# Patient Record
Sex: Female | Born: 1960 | Race: White | Hispanic: No | Marital: Married | Smoking: Former smoker
Health system: Southern US, Community
[De-identification: ages and names within clinical notes are randomized; demographics above are authoritative.]

## PROBLEM LIST (undated history)

## (undated) DIAGNOSIS — T8859XA Other complications of anesthesia, initial encounter: Secondary | ICD-10-CM

## (undated) DIAGNOSIS — R0600 Dyspnea, unspecified: Secondary | ICD-10-CM

## (undated) DIAGNOSIS — K219 Gastro-esophageal reflux disease without esophagitis: Secondary | ICD-10-CM

## (undated) DIAGNOSIS — Z8679 Personal history of other diseases of the circulatory system: Secondary | ICD-10-CM

## (undated) DIAGNOSIS — J449 Chronic obstructive pulmonary disease, unspecified: Secondary | ICD-10-CM

## (undated) DIAGNOSIS — E781 Pure hyperglyceridemia: Secondary | ICD-10-CM

## (undated) DIAGNOSIS — A048 Other specified bacterial intestinal infections: Secondary | ICD-10-CM

## (undated) DIAGNOSIS — I739 Peripheral vascular disease, unspecified: Secondary | ICD-10-CM

## (undated) DIAGNOSIS — D1803 Hemangioma of intra-abdominal structures: Secondary | ICD-10-CM

## (undated) DIAGNOSIS — J189 Pneumonia, unspecified organism: Secondary | ICD-10-CM

## (undated) DIAGNOSIS — K439 Ventral hernia without obstruction or gangrene: Secondary | ICD-10-CM

## (undated) DIAGNOSIS — Z5189 Encounter for other specified aftercare: Secondary | ICD-10-CM

## (undated) DIAGNOSIS — F410 Panic disorder [episodic paroxysmal anxiety] without agoraphobia: Secondary | ICD-10-CM

## (undated) DIAGNOSIS — E785 Hyperlipidemia, unspecified: Secondary | ICD-10-CM

## (undated) DIAGNOSIS — F32A Depression, unspecified: Secondary | ICD-10-CM

## (undated) DIAGNOSIS — Z87442 Personal history of urinary calculi: Secondary | ICD-10-CM

## (undated) DIAGNOSIS — I10 Essential (primary) hypertension: Secondary | ICD-10-CM

## (undated) DIAGNOSIS — F329 Major depressive disorder, single episode, unspecified: Secondary | ICD-10-CM

## (undated) HISTORY — DX: Peripheral vascular disease, unspecified: I73.9

## (undated) HISTORY — DX: Ventral hernia without obstruction or gangrene: K43.9

## (undated) HISTORY — DX: Pure hyperglyceridemia: E78.1

## (undated) HISTORY — DX: Depression, unspecified: F32.A

## (undated) HISTORY — DX: Gastro-esophageal reflux disease without esophagitis: K21.9

## (undated) HISTORY — DX: Panic disorder (episodic paroxysmal anxiety): F41.0

## (undated) HISTORY — DX: Other specified bacterial intestinal infections: A04.8

## (undated) HISTORY — DX: Hemangioma of intra-abdominal structures: D18.03

## (undated) HISTORY — DX: Hyperlipidemia, unspecified: E78.5

## (undated) HISTORY — DX: Chronic obstructive pulmonary disease, unspecified: J44.9

## (undated) HISTORY — DX: Major depressive disorder, single episode, unspecified: F32.9

## (undated) HISTORY — DX: Encounter for other specified aftercare: Z51.89

## (undated) HISTORY — PX: OVARIAN CYST REMOVAL: SHX89

## (undated) HISTORY — PX: TUBAL LIGATION: SHX77

---

## 1997-08-17 ENCOUNTER — Ambulatory Visit (HOSPITAL_COMMUNITY): Admission: RE | Admit: 1997-08-17 | Discharge: 1997-08-17 | Payer: Self-pay | Admitting: *Deleted

## 1997-08-19 ENCOUNTER — Ambulatory Visit (HOSPITAL_COMMUNITY): Admission: RE | Admit: 1997-08-19 | Discharge: 1997-08-19 | Payer: Self-pay | Admitting: *Deleted

## 1997-09-02 ENCOUNTER — Ambulatory Visit (HOSPITAL_COMMUNITY): Admission: RE | Admit: 1997-09-02 | Discharge: 1997-09-02 | Payer: Self-pay

## 1998-01-09 ENCOUNTER — Emergency Department (HOSPITAL_COMMUNITY): Admission: EM | Admit: 1998-01-09 | Discharge: 1998-01-09 | Payer: Self-pay | Admitting: Emergency Medicine

## 1998-03-04 ENCOUNTER — Ambulatory Visit (HOSPITAL_COMMUNITY): Admission: RE | Admit: 1998-03-04 | Discharge: 1998-03-04 | Payer: Self-pay

## 1998-10-14 ENCOUNTER — Encounter: Payer: Self-pay | Admitting: *Deleted

## 1998-10-14 ENCOUNTER — Ambulatory Visit (HOSPITAL_COMMUNITY): Admission: RE | Admit: 1998-10-14 | Discharge: 1998-10-14 | Payer: Self-pay | Admitting: *Deleted

## 2000-05-09 ENCOUNTER — Encounter: Payer: Self-pay | Admitting: *Deleted

## 2000-05-09 ENCOUNTER — Ambulatory Visit (HOSPITAL_COMMUNITY): Admission: RE | Admit: 2000-05-09 | Discharge: 2000-05-09 | Payer: Self-pay | Admitting: *Deleted

## 2001-01-04 ENCOUNTER — Emergency Department (HOSPITAL_COMMUNITY): Admission: EM | Admit: 2001-01-04 | Discharge: 2001-01-04 | Payer: Self-pay | Admitting: Unknown Physician Specialty

## 2001-01-11 ENCOUNTER — Emergency Department (HOSPITAL_COMMUNITY): Admission: EM | Admit: 2001-01-11 | Discharge: 2001-01-11 | Payer: Self-pay | Admitting: Emergency Medicine

## 2001-01-11 ENCOUNTER — Encounter: Payer: Self-pay | Admitting: Emergency Medicine

## 2002-04-21 ENCOUNTER — Inpatient Hospital Stay (HOSPITAL_COMMUNITY): Admission: EM | Admit: 2002-04-21 | Discharge: 2002-04-23 | Payer: Self-pay | Admitting: Emergency Medicine

## 2002-06-26 DIAGNOSIS — Z794 Long term (current) use of insulin: Secondary | ICD-10-CM

## 2002-06-26 DIAGNOSIS — E119 Type 2 diabetes mellitus without complications: Secondary | ICD-10-CM | POA: Insufficient documentation

## 2003-09-01 ENCOUNTER — Emergency Department (HOSPITAL_COMMUNITY): Admission: EM | Admit: 2003-09-01 | Discharge: 2003-09-01 | Payer: Self-pay | Admitting: Emergency Medicine

## 2003-09-09 ENCOUNTER — Encounter: Admission: RE | Admit: 2003-09-09 | Discharge: 2003-09-09 | Payer: Self-pay | Admitting: Internal Medicine

## 2003-09-21 ENCOUNTER — Encounter: Admission: RE | Admit: 2003-09-21 | Discharge: 2003-09-21 | Payer: Self-pay | Admitting: Internal Medicine

## 2003-10-28 ENCOUNTER — Encounter: Admission: RE | Admit: 2003-10-28 | Discharge: 2003-10-28 | Payer: Self-pay | Admitting: Internal Medicine

## 2003-11-04 ENCOUNTER — Encounter: Admission: RE | Admit: 2003-11-04 | Discharge: 2003-11-04 | Payer: Self-pay | Admitting: Internal Medicine

## 2003-11-04 ENCOUNTER — Ambulatory Visit (HOSPITAL_COMMUNITY): Admission: RE | Admit: 2003-11-04 | Discharge: 2003-11-04 | Payer: Self-pay | Admitting: Internal Medicine

## 2003-12-29 ENCOUNTER — Encounter: Admission: RE | Admit: 2003-12-29 | Discharge: 2003-12-29 | Payer: Self-pay | Admitting: Internal Medicine

## 2004-01-29 ENCOUNTER — Encounter: Admission: RE | Admit: 2004-01-29 | Discharge: 2004-01-29 | Payer: Self-pay | Admitting: Internal Medicine

## 2004-03-07 ENCOUNTER — Ambulatory Visit: Payer: Self-pay | Admitting: Internal Medicine

## 2004-03-10 ENCOUNTER — Ambulatory Visit (HOSPITAL_COMMUNITY): Admission: RE | Admit: 2004-03-10 | Discharge: 2004-03-10 | Payer: Self-pay | Admitting: Internal Medicine

## 2004-04-06 ENCOUNTER — Ambulatory Visit: Payer: Self-pay | Admitting: Internal Medicine

## 2004-04-11 ENCOUNTER — Ambulatory Visit (HOSPITAL_COMMUNITY): Admission: RE | Admit: 2004-04-11 | Discharge: 2004-04-11 | Payer: Self-pay | Admitting: Internal Medicine

## 2004-04-11 ENCOUNTER — Ambulatory Visit: Payer: Self-pay | Admitting: Internal Medicine

## 2004-04-11 ENCOUNTER — Encounter (INDEPENDENT_AMBULATORY_CARE_PROVIDER_SITE_OTHER): Payer: Self-pay | Admitting: *Deleted

## 2004-05-31 ENCOUNTER — Ambulatory Visit: Payer: Self-pay | Admitting: Internal Medicine

## 2004-10-04 ENCOUNTER — Ambulatory Visit: Payer: Self-pay | Admitting: Internal Medicine

## 2005-06-29 ENCOUNTER — Emergency Department (HOSPITAL_COMMUNITY): Admission: EM | Admit: 2005-06-29 | Discharge: 2005-06-29 | Payer: Self-pay | Admitting: Emergency Medicine

## 2005-12-13 ENCOUNTER — Ambulatory Visit: Payer: Self-pay | Admitting: Internal Medicine

## 2006-01-02 ENCOUNTER — Encounter (INDEPENDENT_AMBULATORY_CARE_PROVIDER_SITE_OTHER): Payer: Self-pay | Admitting: Specialist

## 2006-01-02 ENCOUNTER — Encounter (INDEPENDENT_AMBULATORY_CARE_PROVIDER_SITE_OTHER): Payer: Self-pay | Admitting: *Deleted

## 2006-01-02 ENCOUNTER — Ambulatory Visit: Payer: Self-pay | Admitting: Hospitalist

## 2006-01-05 ENCOUNTER — Encounter: Admission: RE | Admit: 2006-01-05 | Discharge: 2006-01-05 | Payer: Self-pay | Admitting: Internal Medicine

## 2006-06-12 ENCOUNTER — Ambulatory Visit: Payer: Self-pay | Admitting: *Deleted

## 2006-06-12 LAB — CONVERTED CEMR LAB
ALT: 23 units/L (ref 0–35)
Alkaline Phosphatase: 91 units/L (ref 39–117)
BUN: 13 mg/dL (ref 6–23)
Calcium: 9.3 mg/dL (ref 8.4–10.5)
Cholesterol: 202 mg/dL — ABNORMAL HIGH (ref 0–200)
Creatinine, Ser: 0.58 mg/dL (ref 0.40–1.20)
Glucose, Bld: 157 mg/dL — ABNORMAL HIGH (ref 70–99)
HCT: 45 % — ABNORMAL HIGH (ref 34.4–43.3)
HDL: 23 mg/dL — ABNORMAL LOW (ref >39)
MCHC: 34 g/dL (ref 33.1–35.4)
MCV: 89.6 fL (ref 78.8–100.0)
Microalb Creat Ratio: 32.8 mg/g — ABNORMAL HIGH (ref 0.0–30.0)
RBC: 5.02 M/uL — ABNORMAL HIGH (ref 3.79–4.96)
Total CHOL/HDL Ratio: 8.8
Total Protein: 7 g/dL (ref 6.0–8.3)
Triglycerides: 534 mg/dL — ABNORMAL HIGH (ref <150)
WBC: 10.9 10*3/uL — ABNORMAL HIGH (ref 3.7–10.0)

## 2006-06-14 ENCOUNTER — Encounter (INDEPENDENT_AMBULATORY_CARE_PROVIDER_SITE_OTHER): Payer: Self-pay | Admitting: *Deleted

## 2006-06-14 DIAGNOSIS — K219 Gastro-esophageal reflux disease without esophagitis: Secondary | ICD-10-CM | POA: Insufficient documentation

## 2006-06-14 DIAGNOSIS — E785 Hyperlipidemia, unspecified: Secondary | ICD-10-CM

## 2006-06-14 DIAGNOSIS — J45909 Unspecified asthma, uncomplicated: Secondary | ICD-10-CM | POA: Insufficient documentation

## 2006-11-21 ENCOUNTER — Ambulatory Visit: Payer: Self-pay | Admitting: Internal Medicine

## 2006-11-21 ENCOUNTER — Encounter (INDEPENDENT_AMBULATORY_CARE_PROVIDER_SITE_OTHER): Payer: Self-pay | Admitting: *Deleted

## 2006-12-11 LAB — CONVERTED CEMR LAB
ALT: 18 units/L (ref 0–35)
AST: 14 units/L (ref 0–37)
Albumin: 4.6 g/dL (ref 3.5–5.2)
Calcium: 9.4 mg/dL (ref 8.4–10.5)
Chloride: 104 meq/L (ref 96–112)
Creatinine, Urine: 59.2 mg/dL
Glucose, Bld: 99 mg/dL (ref 70–99)
Helicobacter Pylori Antibody-IgG: 1.2 — ABNORMAL HIGH
Microalb Creat Ratio: 32.6 mg/g — ABNORMAL HIGH (ref 0.0–30.0)
Potassium: 4 meq/L (ref 3.5–5.3)
TSH: 2.148 microintl units/mL (ref 0.350–5.50)

## 2007-04-15 ENCOUNTER — Telehealth (INDEPENDENT_AMBULATORY_CARE_PROVIDER_SITE_OTHER): Payer: Self-pay | Admitting: *Deleted

## 2007-05-07 ENCOUNTER — Ambulatory Visit: Payer: Self-pay | Admitting: *Deleted

## 2007-05-07 LAB — CONVERTED CEMR LAB: Blood Glucose, Fingerstick: 178

## 2007-05-13 LAB — CONVERTED CEMR LAB
AST: 14 units/L (ref 0–37)
Albumin: 4.7 g/dL (ref 3.5–5.2)
Alkaline Phosphatase: 93 units/L (ref 39–117)
HCT: 48.4 % — ABNORMAL HIGH (ref 36.0–46.0)
MCHC: 32.9 g/dL (ref 30.0–36.0)
MCV: 91.8 fL (ref 78.0–100.0)
Platelets: 375 10*3/uL (ref 150–400)
RBC: 5.27 M/uL — ABNORMAL HIGH (ref 3.87–5.11)
RDW: 13 % (ref 11.5–15.5)
Total Bilirubin: 0.4 mg/dL (ref 0.3–1.2)
Total Protein: 7.3 g/dL (ref 6.0–8.3)
WBC: 14.6 10*3/uL — ABNORMAL HIGH (ref 4.0–10.5)

## 2007-07-22 ENCOUNTER — Telehealth (INDEPENDENT_AMBULATORY_CARE_PROVIDER_SITE_OTHER): Payer: Self-pay | Admitting: *Deleted

## 2007-10-23 ENCOUNTER — Ambulatory Visit: Payer: Self-pay | Admitting: *Deleted

## 2007-10-23 ENCOUNTER — Encounter (INDEPENDENT_AMBULATORY_CARE_PROVIDER_SITE_OTHER): Payer: Self-pay | Admitting: *Deleted

## 2007-10-23 DIAGNOSIS — F17200 Nicotine dependence, unspecified, uncomplicated: Secondary | ICD-10-CM | POA: Insufficient documentation

## 2007-10-23 LAB — CONVERTED CEMR LAB
Alkaline Phosphatase: 100 units/L (ref 39–117)
BUN: 11 mg/dL (ref 6–23)
CO2: 25 meq/L (ref 19–32)
Chloride: 101 meq/L (ref 96–112)
Cholesterol: 221 mg/dL — ABNORMAL HIGH (ref 0–200)
Creatinine, Ser: 0.62 mg/dL (ref 0.40–1.20)
Glucose, Bld: 278 mg/dL — ABNORMAL HIGH (ref 70–99)
HDL: 26 mg/dL — ABNORMAL LOW (ref >39)
Potassium: 3.9 meq/L (ref 3.5–5.3)
Total Bilirubin: 0.4 mg/dL (ref 0.3–1.2)
Total CHOL/HDL Ratio: 8.5

## 2007-11-04 ENCOUNTER — Telehealth (INDEPENDENT_AMBULATORY_CARE_PROVIDER_SITE_OTHER): Payer: Self-pay | Admitting: *Deleted

## 2007-11-26 ENCOUNTER — Ambulatory Visit: Payer: Self-pay | Admitting: *Deleted

## 2007-11-27 LAB — CONVERTED CEMR LAB
CO2: 22 meq/L (ref 19–32)
Chloride: 105 meq/L (ref 96–112)
Cholesterol: 218 mg/dL — ABNORMAL HIGH (ref 0–200)
Glucose, Bld: 148 mg/dL — ABNORMAL HIGH (ref 70–99)
Sodium: 139 meq/L (ref 135–145)
VLDL: 70 mg/dL — ABNORMAL HIGH (ref 0–40)

## 2008-02-03 ENCOUNTER — Ambulatory Visit: Payer: Self-pay | Admitting: *Deleted

## 2008-02-03 LAB — CONVERTED CEMR LAB: Hgb A1c MFr Bld: 7.4 %

## 2008-02-04 LAB — CONVERTED CEMR LAB
ALT: 11 units/L (ref 0–35)
AST: 12 units/L (ref 0–37)
CO2: 21 meq/L (ref 19–32)
Calcium: 9.4 mg/dL (ref 8.4–10.5)
Chloride: 106 meq/L (ref 96–112)
Glucose, Bld: 156 mg/dL — ABNORMAL HIGH (ref 70–99)
Potassium: 4.3 meq/L (ref 3.5–5.3)
Sodium: 139 meq/L (ref 135–145)
Total Bilirubin: 0.4 mg/dL (ref 0.3–1.2)

## 2008-03-13 ENCOUNTER — Telehealth: Payer: Self-pay | Admitting: *Deleted

## 2008-04-06 ENCOUNTER — Encounter (INDEPENDENT_AMBULATORY_CARE_PROVIDER_SITE_OTHER): Payer: Self-pay | Admitting: *Deleted

## 2008-04-15 ENCOUNTER — Ambulatory Visit: Payer: Self-pay | Admitting: *Deleted

## 2008-04-15 DIAGNOSIS — F419 Anxiety disorder, unspecified: Secondary | ICD-10-CM | POA: Insufficient documentation

## 2008-05-25 ENCOUNTER — Telehealth: Payer: Self-pay | Admitting: Internal Medicine

## 2008-06-17 ENCOUNTER — Telehealth: Payer: Self-pay | Admitting: Infectious Diseases

## 2008-07-16 ENCOUNTER — Telehealth: Payer: Self-pay | Admitting: *Deleted

## 2008-07-23 ENCOUNTER — Encounter (INDEPENDENT_AMBULATORY_CARE_PROVIDER_SITE_OTHER): Payer: Self-pay | Admitting: Internal Medicine

## 2008-07-23 ENCOUNTER — Ambulatory Visit: Payer: Self-pay | Admitting: Infectious Disease

## 2008-07-23 DIAGNOSIS — R1012 Left upper quadrant pain: Secondary | ICD-10-CM | POA: Insufficient documentation

## 2008-07-23 DIAGNOSIS — F329 Major depressive disorder, single episode, unspecified: Secondary | ICD-10-CM

## 2008-07-23 DIAGNOSIS — F419 Anxiety disorder, unspecified: Secondary | ICD-10-CM

## 2008-07-23 LAB — CONVERTED CEMR LAB: Hgb A1c MFr Bld: 9 %

## 2008-07-26 LAB — CONVERTED CEMR LAB
Albumin: 4.5 g/dL (ref 3.5–5.2)
Alkaline Phosphatase: 93 units/L (ref 39–117)
Chloride: 104 meq/L (ref 96–112)
Eosinophils Relative: 3 % (ref 0–5)
Glucose, Bld: 115 mg/dL — ABNORMAL HIGH (ref 70–99)
Hemoglobin: 15.2 g/dL — ABNORMAL HIGH (ref 12.0–15.0)
Lymphocytes Relative: 28 % (ref 12–46)
Monocytes Absolute: 0.7 10*3/uL (ref 0.1–1.0)
Neutro Abs: 9.3 10*3/uL — ABNORMAL HIGH (ref 1.7–7.7)
RDW: 13 % (ref 11.5–15.5)
Total Bilirubin: 0.4 mg/dL (ref 0.3–1.2)
WBC: 14.4 10*3/uL — ABNORMAL HIGH (ref 4.0–10.5)

## 2008-07-29 ENCOUNTER — Telehealth: Payer: Self-pay | Admitting: *Deleted

## 2008-08-21 ENCOUNTER — Telehealth (INDEPENDENT_AMBULATORY_CARE_PROVIDER_SITE_OTHER): Payer: Self-pay | Admitting: *Deleted

## 2008-09-02 ENCOUNTER — Ambulatory Visit: Payer: Self-pay | Admitting: Internal Medicine

## 2008-09-02 DIAGNOSIS — R1013 Epigastric pain: Secondary | ICD-10-CM

## 2008-09-02 DIAGNOSIS — K5900 Constipation, unspecified: Secondary | ICD-10-CM

## 2008-09-08 ENCOUNTER — Telehealth (INDEPENDENT_AMBULATORY_CARE_PROVIDER_SITE_OTHER): Payer: Self-pay | Admitting: *Deleted

## 2008-09-29 ENCOUNTER — Ambulatory Visit: Payer: Self-pay | Admitting: *Deleted

## 2008-09-29 LAB — CONVERTED CEMR LAB
BUN: 10 mg/dL (ref 6–23)
CO2: 21 meq/L (ref 19–32)
Calcium: 9.3 mg/dL (ref 8.4–10.5)
Chloride: 102 meq/L (ref 96–112)
Creatinine, Ser: 0.97 mg/dL (ref 0.40–1.20)
GFR calc Af Amer: >60 mL/min (ref >60)
Glucose, Bld: 223 mg/dL — ABNORMAL HIGH (ref 70–99)
Microalb Creat Ratio: 24.3 mg/g (ref 0.0–30.0)
Total CHOL/HDL Ratio: 6.9

## 2008-09-30 ENCOUNTER — Encounter (INDEPENDENT_AMBULATORY_CARE_PROVIDER_SITE_OTHER): Payer: Self-pay | Admitting: *Deleted

## 2008-11-18 ENCOUNTER — Telehealth (INDEPENDENT_AMBULATORY_CARE_PROVIDER_SITE_OTHER): Payer: Self-pay | Admitting: *Deleted

## 2008-12-01 ENCOUNTER — Encounter (INDEPENDENT_AMBULATORY_CARE_PROVIDER_SITE_OTHER): Payer: Self-pay | Admitting: *Deleted

## 2008-12-01 ENCOUNTER — Ambulatory Visit: Payer: Self-pay | Admitting: *Deleted

## 2008-12-03 ENCOUNTER — Encounter (INDEPENDENT_AMBULATORY_CARE_PROVIDER_SITE_OTHER): Payer: Self-pay | Admitting: *Deleted

## 2009-01-15 ENCOUNTER — Telehealth (INDEPENDENT_AMBULATORY_CARE_PROVIDER_SITE_OTHER): Payer: Self-pay | Admitting: *Deleted

## 2009-02-11 ENCOUNTER — Telehealth (INDEPENDENT_AMBULATORY_CARE_PROVIDER_SITE_OTHER): Payer: Self-pay | Admitting: Internal Medicine

## 2009-03-09 ENCOUNTER — Ambulatory Visit: Payer: Self-pay | Admitting: Infectious Disease

## 2009-03-09 DIAGNOSIS — R6882 Decreased libido: Secondary | ICD-10-CM | POA: Insufficient documentation

## 2009-03-09 LAB — CONVERTED CEMR LAB: Blood Glucose, AC Bkfst: 314 mg/dL

## 2009-04-12 ENCOUNTER — Ambulatory Visit: Payer: Self-pay | Admitting: Internal Medicine

## 2009-04-12 LAB — CONVERTED CEMR LAB: Blood Glucose, Fingerstick: 361

## 2009-04-19 ENCOUNTER — Encounter (INDEPENDENT_AMBULATORY_CARE_PROVIDER_SITE_OTHER): Payer: Self-pay | Admitting: Internal Medicine

## 2009-04-19 ENCOUNTER — Ambulatory Visit: Payer: Self-pay | Admitting: Internal Medicine

## 2009-05-17 ENCOUNTER — Telehealth: Payer: Self-pay | Admitting: Internal Medicine

## 2009-07-16 ENCOUNTER — Telehealth: Payer: Self-pay | Admitting: *Deleted

## 2009-07-28 ENCOUNTER — Telehealth: Payer: Self-pay | Admitting: *Deleted

## 2009-08-24 ENCOUNTER — Telehealth (INDEPENDENT_AMBULATORY_CARE_PROVIDER_SITE_OTHER): Payer: Self-pay | Admitting: *Deleted

## 2009-09-01 ENCOUNTER — Ambulatory Visit: Payer: Self-pay | Admitting: Internal Medicine

## 2009-09-01 LAB — CONVERTED CEMR LAB: Blood Glucose, Fingerstick: 130

## 2009-09-16 ENCOUNTER — Telehealth (INDEPENDENT_AMBULATORY_CARE_PROVIDER_SITE_OTHER): Payer: Self-pay | Admitting: Internal Medicine

## 2009-10-06 ENCOUNTER — Telehealth (INDEPENDENT_AMBULATORY_CARE_PROVIDER_SITE_OTHER): Payer: Self-pay | Admitting: Internal Medicine

## 2009-11-25 ENCOUNTER — Ambulatory Visit: Payer: Self-pay | Admitting: Internal Medicine

## 2009-11-26 DIAGNOSIS — E781 Pure hyperglyceridemia: Secondary | ICD-10-CM | POA: Insufficient documentation

## 2009-11-26 LAB — CONVERTED CEMR LAB
ALT: 16 units/L (ref 0–35)
Albumin: 4.3 g/dL (ref 3.5–5.2)
Calcium: 9.4 mg/dL (ref 8.4–10.5)
Chloride: 97 meq/L (ref 96–112)
Creatinine, Urine: 57.9 mg/dL
HDL: 26 mg/dL — ABNORMAL LOW (ref >39)
Microalb, Ur: 54.57 mg/dL — ABNORMAL HIGH (ref 0.00–1.89)
Potassium: 4.3 meq/L (ref 3.5–5.3)
Total Bilirubin: 0.3 mg/dL (ref 0.3–1.2)
Total CHOL/HDL Ratio: 12

## 2010-01-07 ENCOUNTER — Telehealth: Payer: Self-pay | Admitting: Internal Medicine

## 2010-02-21 ENCOUNTER — Telehealth: Payer: Self-pay | Admitting: Internal Medicine

## 2010-03-01 ENCOUNTER — Ambulatory Visit: Payer: Self-pay | Admitting: Internal Medicine

## 2010-03-01 LAB — CONVERTED CEMR LAB: Hgb A1c MFr Bld: 9.6 %

## 2010-03-02 ENCOUNTER — Telehealth: Payer: Self-pay | Admitting: *Deleted

## 2010-03-11 ENCOUNTER — Ambulatory Visit: Payer: Self-pay | Admitting: Internal Medicine

## 2010-03-11 ENCOUNTER — Telehealth (INDEPENDENT_AMBULATORY_CARE_PROVIDER_SITE_OTHER): Payer: Self-pay | Admitting: *Deleted

## 2010-03-15 LAB — CONVERTED CEMR LAB: HDL: 26 mg/dL — ABNORMAL LOW (ref >39)

## 2010-04-08 ENCOUNTER — Telehealth (INDEPENDENT_AMBULATORY_CARE_PROVIDER_SITE_OTHER): Payer: Self-pay | Admitting: *Deleted

## 2010-05-02 ENCOUNTER — Ambulatory Visit: Payer: Self-pay | Admitting: Internal Medicine

## 2010-05-02 LAB — CONVERTED CEMR LAB
Albumin: 4.4 g/dL (ref 3.5–5.2)
BUN: 5 mg/dL — ABNORMAL LOW (ref 6–23)
Chloride: 100 meq/L (ref 96–112)
Cholesterol: 269 mg/dL — ABNORMAL HIGH (ref 0–200)
Creatinine, Ser: 0.55 mg/dL (ref 0.40–1.20)
HDL: 23 mg/dL — ABNORMAL LOW (ref >39)
Potassium: 4.2 meq/L (ref 3.5–5.3)
Sodium: 133 meq/L — ABNORMAL LOW (ref 135–145)
Total CHOL/HDL Ratio: 11.7
Triglycerides: 1714 mg/dL — ABNORMAL HIGH (ref <150)

## 2010-05-16 ENCOUNTER — Encounter: Payer: Self-pay | Admitting: Internal Medicine

## 2010-05-30 ENCOUNTER — Telehealth (INDEPENDENT_AMBULATORY_CARE_PROVIDER_SITE_OTHER): Payer: Self-pay | Admitting: *Deleted

## 2010-07-17 ENCOUNTER — Encounter: Payer: Self-pay | Admitting: *Deleted

## 2010-07-17 ENCOUNTER — Encounter: Payer: Self-pay | Admitting: Internal Medicine

## 2010-07-18 ENCOUNTER — Telehealth (INDEPENDENT_AMBULATORY_CARE_PROVIDER_SITE_OTHER): Payer: Self-pay | Admitting: *Deleted

## 2010-07-18 ENCOUNTER — Telehealth: Payer: Self-pay | Admitting: Internal Medicine

## 2010-07-25 ENCOUNTER — Ambulatory Visit: Admit: 2010-07-25 | Payer: Self-pay

## 2010-07-26 NOTE — Progress Notes (Signed)
Summary: refill/gg  Phone Note Refill Request  on October 06, 2009 10:49 AM  Refills Requested: Medication #1:  CYMBALTA 60 MG CPEP Take 1 tablet by mouth once a day   Dosage confirmed as above?Dosage Confirmed   Brand Name Necessary? No   Supply Requested: 3 months   Last Refilled: 09/01/2009 [facility]   Method Requested: Electronic Initial call taken by: Merrie Roof RN,  October 06, 2009 11:02 AM  Follow-up for Phone Call        Please call or fax in for patient. Thanks. Follow-up by: Nilda Riggs MD,  October 06, 2009 11:37 AM  Additional Follow-up for Phone Call Additional follow up Details #1::        Rx called to pharmacy Additional Follow-up by: Merrie Roof RN,  October 06, 2009 4:32 PM    Prescriptions: CYMBALTA 60 MG CPEP (DULOXETINE HCL) Take 1 tablet by mouth once a day  #30 x 2   Entered and Authorized by:   Nilda Riggs MD   Signed by:   Nilda Riggs MD on 10/06/2009   Method used:   Telephoned to ...       [facility] Department (retail)       [address] [place], [place]  [address]       Ph: [phone]       Fax: [phone]   RxID:   [id number]

## 2010-07-26 NOTE — Assessment & Plan Note (Signed)
Summary: EST-CK/FU/MEDS/CFB   Vital Signs:  Patient profile:   50 year old female Height:      63 inches (160.02 cm) Weight:      194.3 pounds (88.32 kg) BMI:     34.54 Temp:     98.8 degrees F (37.11 degrees C) Pulse rate:   87 / minute BP sitting:   130 / 78  (left arm) Cuff size:   large  Vitals Entered By: Dorie Rank RN (September 01, 2009 2:05 PM) CC: check up- especially since started insulin in Nov., Depression Is Patient Diabetic? Yes Did you bring your meter with you today? No Nutritional Status BMI of > 30 = obese CBG Result 130  Have you ever been in a relationship where you felt threatened, hurt or afraid?No   Does patient need assistance? Functional Status Self care Ambulation Normal   Diabetic Foot Exam Foot Inspection Is there a history of a foot ulcer?              No Is there a foot ulcer now?              No Can the patient see the bottom of their feet?          Yes Are the shoes appropriate in style and fit?          Yes Is there swelling or an abnormal foot shape?          No Are the toenails long?                No Are the toenails thick?                No Are the toenails ingrown?              No Is there heavy callous build-up?              Yes Is there pain in the calf muscle (Intermittent claudication) when walking?    NoIs there a claw toe deformity?              No Is there elevated skin temperature?            No Is there limited ankle dorsiflexion?            No Is there foot or ankle muscle weakness?            No  Diabetic Foot Care Education Patient educated on appropriate care of diabetic feet.  Comments: very dry scaly skin, calloused around heels, will use lotion   10-g (5.07) Semmes-Weinstein Monofilament Test Performed by: Dorie Rank RN          Right Foot          Left Foot Visual Inspection     normal           normal Site 1         normal         normal Site 2         normal         normal Site 3         normal          normal Site 4         normal         normal Site 5         normal         normal Site 6  normal         normal Site 9         normal         normal  Impression      normal         normal  Legend:  Site 1 = Plantar aspect of first toe (center of pad) Site 2 = Plantar aspect of third toe (center of pad) Site 3 = Plantar aspect of fifth toe (center of pad) Site 4 = Plantar aspect of first metatarsal head Site 5 = Plantar aspect of third metatarsal head Site 6 = Plantar aspect of fifth metatarsal head Site 7 = Plantar aspect of medial midfoot Site 8 = Plantar aspect of lateral midfoot Site 9 = Plantar aspect of heel Site 10 = dorsal aspect of foot between the base of the first and second toes   Result is Abnormal if patient was unable to perceive the monofilament at site indicated.    Primary Care Provider:  Jessy Oto, MD  CC:  check up- especially since started insulin in Nov. and Depression.  History of Present Illness: Patient states that everyone in her household has had the flu in the past month,including herself, but that everyone is improved (or improving) now-although she is tired from having to help take care of everyone. Admits to not being able to exercise asmuch as desired due to the weather and her manual treadmill being to difficult to use. She reports that she has been taking her lantus and glipizide as scheduled, but is still gettting high readings. She did not bring her meter with her today, but denies any readings that would approach 100, as most are near the 200 level (c/w her A1c checked prior to visit which is 9.3) Not sleeping that good at night, but admits to poor sleep hygeine and drinking coffee prior to trying to rest as well as watching TV. Still complains of mild nausea, abdominal pains that she has had in the past are improved.    Depression History:      The patient denies a depressed mood most of the day and a diminished interest in her  usual daily activities.        Comments:  as long as I take my medicine I am fine.   Preventive Screening-Counseling & Management  Alcohol-Tobacco     Smoking Status: current     Packs/Day: 0.5  Comments: has decreased smoking from 1 to 1/2 ppd  Current Medications (verified): 1)  Glucotrol 10 Mg Tabs (Glipizide) .... Take One Tablet By Mouth Two Times A Day 2)  Pravachol 20 Mg Tabs (Pravastatin Sodium) .... Take 1 Tablet By Mouth Once A Day 3)  Lorazepam 1 Mg Tabs (Lorazepam) .... Take One Tablet By Mouth Every Eight Hours As Needed For Anxiety Attacks 4)  Proventil 90 Mcg/act Aers (Albuterol) .... Two Puffs Every Four Hours As Needed For Shortness of Breath 5)  Aspir-Low 81 Mg Tbec (Aspirin) .... Take 1 Tablet By Mouth Once A Day 6)  Freestyle Lite   Strp (Glucose Blood) 7)  Advair Diskus 500-50 Mcg/dose Misc (Fluticasone-Salmeterol) .... Take Two Puffs Twice A Day 8)  Cymbalta 60 Mg Cpep (Duloxetine Hcl) .... Take 1 Tablet By Mouth Once A Day 9)  Lantus Solostar 100 Unit/ml Soln (Insulin Glargine) .... Take 15 Units Subcutaneously At Bedtime 10)  Pen Needles 5/16" 31g X 8 Mm Misc (Insulin Pen Needle) .... Use To Inject  Insulin Once Daily  Allergies (verified):  1)  Glucophage 2)  * Avandia  Past History:  Past Medical History: Last updated: 06/14/2006 Asthma Diabetes mellitus, type II GERD H pylori infection s/p triple therapy Menorrhagia Hyperlipidemia Hepatic hemangioma  Past Surgical History: Last updated: 06/14/2006 Tubal ligation Ovarian cyst removal  Family History: Last updated: 09/02/2008 Family History of Breast Cancer:Aunt Family History of Ovarian Cancer:Aunts, maternal Family History of Colon Polyps:Mother Family History of Diabetes: Uncle/ Maternal  Social History: Last updated: 09/01/2009 Previously drove school bus.  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  Smokes 1ppd, down to half ppd in 08/2008.   Occupation: Unemployed Patient currently  smokes. <1 ppd Alcohol Use - no Daily Caffeine Use - 3 Illicit Drug Use - no  Risk Factors: Exercise: no (09/29/2008)  Risk Factors: Smoking Status: current (09/01/2009) Packs/Day: 0.5 (09/01/2009)  Social History: Previously drove school bus.  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  Smokes 1ppd, down to half ppd in 08/2008.   Occupation: Unemployed Patient currently smokes. <1 ppd Alcohol Use - no Daily Caffeine Use - 3 Illicit Drug Use - no Packs/Day:  0.5  Review of Systems GI:  Complains of nausea; Intermittent nausea, stomach pains better with OTC H2 blockers. Derm:  Complains of dryness.  Physical Exam  General:  alert, well-developed, well-nourished, and well-hydrated.   Head:  normocephalic and atraumatic.   Eyes:  vision grossly intact, pupils equal, pupils round, and pupils reactive to light.   Ears:  no external deformities.   Nose:  no external deformity, no external erythema, and no nasal discharge.   Mouth:  pharynx pink and moist and teeth missing.   Neck:  supple, full ROM, and no carotid bruits.   Lungs:  normal respiratory effort, no intercostal retractions, no accessory muscle use, and normal breath sounds.   Heart:  normal rate, regular rhythm, no murmur, no gallop, and no rub.   Abdomen:  soft, non-tender, normal bowel sounds, no distention, no masses, and no guarding.   Msk:  normal ROM.   Extremities:  No cyanosis, clubbing or edema. Neurologic:  alert & oriented X3, cranial nerves II-XII intact, and gait normal.   Skin:  turgor normal, color normal, and no rashes.   Cervical Nodes:  no anterior cervical adenopathy.   Psych:  Oriented X3, memory intact for recent and remote, normally interactive, good eye contact, not anxious appearing, and not depressed appearing.    Diabetes Management Exam:    Foot Exam (with socks and/or shoes not present):       Sensory-Monofilament:          Left foot: normal          Right foot: normal   Impression &  Recommendations:  Problem # 1:  DIABETES MELLITUS, TYPE II (ICD-250.00) Patient did not bring glucometer and her diary was not filled out. Patient is committed to bringing her meter at the next visit and we agreed on raising her lantus dose to 18 per day as she states she never has a sugar less that 100. Have also instructed the patient that she may increase her Lantus by 2 units every 3 days if her fasting blood glucose remains above 130. Her A1c appears to have an average blood glucose of 240, so I believe it will be safe to increase her Lantus without her record at this time, given her accounts of never having any near-lows. D/w pt. regarding symptoms of low blood sugar and what to do in the event of a low reading. Patient  had eye exam in August of last year and had no retinopathy. Will set due date for this coming August. Encouraged compliance with diet and exercise.  Her updated medication list for this problem includes:    Glucotrol 10 Mg Tabs (Glipizide) .Marland Kitchen... Take one tablet by mouth two times a day    Aspir-low 81 Mg Tbec (Aspirin) .Marland Kitchen... Take 1 tablet by mouth once a day    Lantus Solostar 100 Unit/ml Soln (Insulin glargine) .Marland Kitchen... Take 18 units subcutaneously at bedtime. if your morning blod sugar remains above 130 for three days, you may increase 2 units and continue until target of 130 is met  Orders: T-Hgb A1C (in-house) ([id number]) T- Capillary Blood Glucose (14782)  Problem # 2:  HYPERLIPIDEMIA (ICD-272.4) Will check FLP and LFT at next appointment in the morning. Will adjust pravastatin if needed at that time.  Her updated medication list for this problem includes:    Pravachol 20 Mg Tabs (Pravastatin sodium) .Marland Kitchen... Take 1 tablet by mouth once a day  Future Orders: T-Lipid Profile ([id number]) ... 09/02/2009  Labs Reviewed: SGOT: 14 (07/23/2008)   SGPT: 12 (07/23/2008)   HDL:27 (09/29/2008), 25 (11/26/2007)  LDL:See Comment mg/dL (78/46/9629), 528 (41/32/4401)  Chol:186  (09/29/2008), 218 (11/26/2007)  Trig:609 (09/29/2008), 350 (11/26/2007)  Problem # 3:  Sx of SHOULDER PAIN, LEFT, CHRONIC (ICD-719.41) Patient states that she was unable to go for therapy, but at-home exercises that she was shown are improving the pain. Will continue to monitor, but nothing needing urgent management at this visit. Her updated medication list for this problem includes:    Aspir-low 81 Mg Tbec (Aspirin) .Marland Kitchen... Take 1 tablet by mouth once a day  Problem # 4:  ANXIETY DEPRESSION (ICD-300.4) Patient states that cymbalta is her life line, does not appear at all depressed today. Her son was recently involved in helping guide a father on the side of the road in delivering a baby (he is a Science writer). He will be on the Dr. Neil Crouch show this coming weekend and patient is excited about the happenings in her family's lives.  Problem # 5:  TOBACCO USER (ICD-305.1) Patient encouraged to quit again - first by cutting back - she is down from 1 ppd to 1/2 ppd - set goal to get down to 3 per day if possible, but made sure that the end goal would be zero.  Problem # 6:  Preventive Health Care (ICD-V70.0) Mammogram ordered.  Problem # 7:  ABDOMINAL PAIN-EPIGASTRIC (ICD-789.06) Improved, still complains of mild nausea. No changes given improvement without specific treatment.  Complete Medication List: 1)  Glucotrol 10 Mg Tabs (Glipizide) .... Take one tablet by mouth two times a day 2)  Pravachol 20 Mg Tabs (Pravastatin sodium) .... Take 1 tablet by mouth once a day 3)  Lorazepam 1 Mg Tabs (Lorazepam) .... Take one tablet by mouth every eight hours as needed for anxiety attacks 4)  Proventil 90 Mcg/act Aers (Albuterol) .... Two puffs every four hours as needed for shortness of breath 5)  Aspir-low 81 Mg Tbec (Aspirin) .... Take 1 tablet by mouth once a day 6)  Freestyle Lite Strp (Glucose blood) 7)  Advair Diskus 500-50 Mcg/dose Misc (Fluticasone-salmeterol) .... Take two puffs twice a day 8)   Cymbalta 60 Mg Cpep (Duloxetine hcl) .... Take 1 tablet by mouth once a day 9)  Lantus Solostar 100 Unit/ml Soln (Insulin glargine) .... Take 18 units subcutaneously at bedtime. if your morning blod sugar remains above 130 for three days,  you may increase 2 units and continue until target of 130 is met 10)  Pen Needles 5/16" 31g X 8 Mm Misc (Insulin pen needle) .... Use to inject  insulin once daily  Other Orders: Mammogram (Screening) (Mammo) Future Orders: T-Hepatic Function [id number]) ... 09/02/2009  Patient Instructions: 1)  You may titrate your Lantus dose higher if your morning blood sugar remains more than 130 - increase your Lantus by 2 units every three days until the target of 130 is met. Please call the clinic with any questions. 2)  Please schedule a follow-up appointment in 1 month. 3)  Please return for lab work one (1) week before your next appointment.  4)  Please return for a FASTING Lipid Profile one (1) week before your next appointment as scheduled. 5)  Stop Smoking Tips: Choose a Quit date. Cut down before the Quit date. decide what you will do as a substitute when you feel the urge to smoke(gum,toothpick,exercise). 6)  It is important that you exercise regularly at least 20 minutes 5 times a week. If you develop chest pain, have severe difficulty breathing, or feel very tired , stop exercising immediately and seek medical attention. 7)  Your mammogram has been scheduled. 8)  Check your blood sugars regularly. If your readings are usually above 300 or below 70 you should contact our office. Prescriptions: LANTUS SOLOSTAR 100 UNIT/ML SOLN (INSULIN GLARGINE) Take 18 units subcutaneously at bedtime. If your morning blod sugar remains above 130 for three days, you may increase 2 units and continue until target of 130 is met  #1 x 4   Entered and Authorized by:   Nilda Riggs MD   Signed by:   Nilda Riggs MD on 09/01/2009   Method used:   Print then Give to Patient    RxID:   [id number] LORAZEPAM 1 MG TABS (LORAZEPAM) Take one tablet by mouth every eight hours as needed for anxiety attacks  #60 x 2   Entered and Authorized by:   Nilda Riggs MD   Signed by:   Nilda Riggs MD on 09/01/2009   Method used:   Print then Give to Patient   RxID:   [id number] PRAVACHOL 20 MG TABS (PRAVASTATIN SODIUM) Take 1 tablet by mouth once a day  #31 x 5   Entered and Authorized by:   Nilda Riggs MD   Signed by:   Nilda Riggs MD on 09/01/2009   Method used:   Print then Give to Patient   RxID:   [id number]   Prevention & Chronic Care Immunizations   Influenza vaccine: Fluvax 3+  (04/12/2009)    Tetanus booster: Not documented   Td booster deferral: Refused  (09/01/2009)    Pneumococcal vaccine: Not documented  Other Screening   Pap smear: NEGATIVE FOR INTRAEPITHELIAL LESIONS OR MALIGNANCY.  (12/01/2008)   Pap smear due: 12/01/2009    Mammogram: Normal  (12/25/2005)   Mammogram action/deferral: Ordered  (09/01/2009)   Smoking status: current  (09/01/2009)   Smoking cessation counseling: yes  (03/09/2009)  Diabetes Mellitus   HgbA1C: 9.3  (09/01/2009)   HgbA1C action/deferral: Ordered  (09/01/2009)    Eye exam: Not documented   Diabetic eye exam action/deferral: Not indicated  (09/01/2009)    Foot exam: yes  (09/01/2009)   Foot exam action/deferral: Do today   High risk foot: Not documented   Foot care education: Done  (09/01/2009)    Urine microalbumin/creatinine ratio: 24.3  (09/29/2008)    Diabetes flowsheet reviewed?: Yes  Progress toward A1C goal: Unchanged   Diabetes comments: Had eye exam in August 2010, states that there was no retinopathy  Lipids   Total Cholesterol: 186  (09/29/2008)   Lipid panel action/deferral: Lipid Panel ordered   LDL: See Comment mg/dL  (44/06/270)   LDL Direct: Not documented   HDL: 27  (09/29/2008)   Triglycerides: 609  (09/29/2008)    SGOT (AST): 14  (07/23/2008)   BMP action:  Ordered   SGPT (ALT): 12  (07/23/2008)   Alkaline phosphatase: 93  (07/23/2008)   Total bilirubin: 0.4  (07/23/2008)   Liver panel due: 10/02/2009    Lipid flowsheet reviewed?: Yes   Progress toward LDL goal: Unchanged  Self-Management Support :   Personal Goals (by the next clinic visit) :     Personal A1C goal: 8  (03/09/2009)     Personal blood pressure goal: 130/80  (09/01/2009)     Personal LDL goal: 100  (03/09/2009)    Patient will work on the following items until the next clinic visit to reach self-care goals:     Medications and monitoring: take my medicines every day, check my blood sugar, bring all of my medications to every visit, examine my feet every day  (09/01/2009)     Eating: drink diet soda or water instead of juice or soda, eat baked foods instead of fried foods  (09/01/2009)     Activity: take a 30 minute walk every day  (03/09/2009)     Other: eats yogurt instead of dessert, got a treadmill at home but made knees hurt - will try to walk more when weather better  (09/01/2009)    Diabetes self-management support: Written self-care plan, Education handout, Pre-printed educational material  (09/01/2009)   Diabetes care plan printed   Diabetes education handout printed   Last diabetes self-management training by diabetes educator: 04/19/2009    Lipid self-management support: Written self-care plan, Education handout, Pre-printed educational material  (09/01/2009)   Lipid self-care plan printed.   Lipid education handout printed   Nursing Instructions: HgbA1C today (see order) CBG today (see order) Diabetic foot exam today Schedule screening mammogram (see order)   Process Orders Check Orders Results:     [facility]: ABN not required for this insurance Tests Sent for requisitioning (September 01, 2009 3:21 PM):     09/02/2009: [facility] -- T-Hepatic Function [id number] (signed)     09/02/2009: [facility] --  T-Lipid Profile [id number] (signed)    Laboratory Results   Blood Tests   Date/Time Received: September 01, 2009 2:40 PM Date/Time Reported: Alric Quan  September 01, 2009 2:40 PM   HGBA1C: 9.3%   (Normal Range: Non-Diabetic - 3-6%   Control Diabetic - 6-8%) CBG Random:: 130mg /dL

## 2010-07-26 NOTE — Progress Notes (Signed)
Summary: med refill/gp  Phone Note Refill Request Message from:  Fax from Pharmacy on September 16, 2009 10:17 AM  Refills Requested: Medication #1:  GLUCOTROL 10 MG TABS Take one tablet by mouth two times a day   Last Refilled: 06/16/2009  Method Requested: Telephone to Pharmacy Initial call taken by: Chinita Pester RN,  September 16, 2009 10:18 AM  Follow-up for Phone Call        Refill approved-nurse to complete Follow-up by: Nilda Riggs MD,  September 16, 2009 11:04 AM  Additional Follow-up for Phone Call Additional follow up Details #1::        Rx refill request faxed to [facility]. Additional Follow-up by: Chinita Pester RN,  September 16, 2009 11:18 AM    Prescriptions: GLUCOTROL 10 MG TABS (GLIPIZIDE) Take one tablet by mouth two times a day  #60 x 3   Entered and Authorized by:   Nilda Riggs MD   Signed by:   Nilda Riggs MD on 09/16/2009   Method used:   Telephoned to ...       [facility] Department (retail)       [address] [place], [place]  [address]       Ph: [phone]       Fax: [phone]   RxID:   [id number]

## 2010-07-26 NOTE — Progress Notes (Signed)
Summary: Refill/gh  Phone Note Refill Request Message from:  Fax from Pharmacy on April 08, 2010 4:20 PM  Refills Requested: Medication #1:  GLUCOTROL 10 MG TABS Take one tablet by mouth two times a day   Last Refilled: 12/24/2009 Office vist was 11/25/2009.  Last labs were 11/25/2009.   Method Requested: Fax to Local Pharmacy Initial call taken by: Angelina Ok RN,  April 08, 2010 4:20 PM  Follow-up for Phone Call        Rx faxed to pharmacy  Of note, TG markedly elevated and pt cannot afford Tricor.  Consider Gemfibrozil for potential cost benefit if there is no contraindication. Follow-up by: Mariea Stable MD,  April 08, 2010 4:31 PM    Prescriptions: GLUCOTROL 10 MG TABS (GLIPIZIDE) Take one tablet by mouth two times a day  #60 x 3   Entered by:   Mariea Stable MD   Authorized by:   Marland Kitchen [facility] ATTENDING DESKTOP   Signed by:   Mariea Stable MD on 04/08/2010   Method used:   Faxed to ...       [facility] Department (retail)       [address] [place], [place]  [address]       Ph: [phone]       Fax: [phone]   RxID:   [id number]

## 2010-07-26 NOTE — Progress Notes (Signed)
Summary: Refill/gh  Phone Note Refill Request Message from:  Fax from Pharmacy on February 21, 2010 2:51 PM  Refills Requested: Medication #1:  PROVENTIL 90 MCG/ACT AERS Two puffs every four hours as needed for shortness of breath   Last Refilled: 12/17/2009 Last visit and labs 11/25/2009.   Method Requested: Fax to Local Pharmacy Initial call taken by: Angelina Ok RN,  February 21, 2010 2:51 PM    Prescriptions: PROVENTIL 90 MCG/ACT AERS (ALBUTEROL) Two puffs every four hours as needed for shortness of breath  #1 MDI x 6   Entered and Authorized by:   Zoila Shutter MD   Signed by:   Zoila Shutter MD on 02/21/2010   Method used:   Faxed to ...       [facility] Department (retail)       [address] [place], [place]  [address]       Ph: [phone]       Fax: [phone]   RxID:   [id number]

## 2010-07-26 NOTE — Progress Notes (Signed)
Summary: CANCELLED DIABETIC APPOINTMENT  Phone Note Other Incoming   Caller: Patient Reason for Call: Confirm/change Appt Summary of Call: Patient cancelled her appointment and stated she did not need to see Jamison Neighbor for her diabetes at this time and will call us when she needs her. Initial call taken by: Shon Hough,  May 30, 2010 3:19 PM

## 2010-07-26 NOTE — Assessment & Plan Note (Signed)
Summary: ACUTE-F/U WITH MEDICATIONS AND BLOOD WORK/CFB(ILLATH)   Vital Signs:  Patient profile:   50 year old female Height:      63 inches (160.02 cm) Weight:      192.0 pounds (87.27 kg) BMI:     34.13 Temp:     97.1 degrees F (36.17 degrees C) oral Pulse rate:   101 / minute BP sitting:   123 / 69  (right arm) Cuff size:   regular  Vitals Entered By: Theotis Barrio NT II (May 02, 2010 9:18 AM) CC: FEELING DEPRESSED  /  MEDICATION   / , Depression Is Patient Diabetic? Yes Did you bring your meter with you today? No Pain Assessment Patient in pain? no      Nutritional Status BMI of > 30 = obese  Have you ever been in a relationship where you felt threatened, hurt or afraid?No   Does patient need assistance? Functional Status Self care Ambulation Normal   Primary Care Provider:  Almyra Deforest MD  CC:  FEELING DEPRESSED  /  MEDICATION   /  and Depression.  History of Present Illness: This is 50 year old female with PMH of Asthma, Depression/anxiety, DM and HDL who came here for a regular visit and med refill. She still has chronic exertional SOB, but no fever, CP, diarrhea, muscle pain, dysuria. She has been taking tricor since 8/11. Her CBG runs  about 130. She takes her meds as instructed except running out of her cymblata/lorazepam for 3 weeks, feels slight depression/anxiety, but no SI/HI. Current smoker 1PPD, no ETOH or drug abuse.    Depression History:      The patient is having a depressed mood most of the day.        Comments:  HAVING SOME DEPRESSEION WITH NO SUICIDEAL THOUGHTS.   Preventive Screening-Counseling & Management  Alcohol-Tobacco     Alcohol drinks/day: 0     Smoking Status: current     Smoking Cessation Counseling: yes     Packs/Day: 1.0     Year Started: at the age of 60  Caffeine-Diet-Exercise     Does Patient Exercise: no  Problems Prior to Update: 1)  Hypertriglyceridemia  (ICD-272.1) 2)  ? of Popliteal Cyst, Left   (ICD-727.51) 3)  Sx of Shoulder Pain, Left, Chronic  (ICD-719.41) 4)  Libido, Decreased  (ICD-799.81) 5)  Constipation  (ICD-564.00) 6)  Abdominal Pain-epigastric  (ICD-789.06) 7)  Anxiety Depression  (ICD-300.4) 8)  Abdominal Pain, Left Upper Quadrant  (ICD-789.02) 9)  Anxiety  (ICD-300.00) 10)  Tobacco User  (ICD-305.1) 11)  Hyperlipidemia  (ICD-272.4) 12)  Gerd  (ICD-530.81) 13)  Diabetes Mellitus, Type II  (ICD-250.00) 14)  Asthma  (ICD-493.90)  Medications Prior to Update: 1)  Glucotrol 10 Mg Tabs (Glipizide) .... Take One Tablet By Mouth Two Times A Day 2)  Lorazepam 1 Mg Tabs (Lorazepam) .... Take One Tablet By Mouth Every Eight Hours As Needed For Anxiety Attacks 3)  Proventil 90 Mcg/act Aers (Albuterol) .... Two Puffs Every Four Hours As Needed For Shortness of Breath 4)  Aspir-Low 81 Mg Tbec (Aspirin) .... Take 1 Tablet By Mouth Once A Day 5)  Freestyle Lite   Strp (Glucose Blood) 6)  Advair Diskus 500-50 Mcg/dose Misc (Fluticasone-Salmeterol) .... Take One Puff Twice A Day 7)  Cymbalta 60 Mg Cpep (Duloxetine Hcl) .... Take 1 Tablet By Mouth Once A Day 8)  Lantus Solostar 100 Unit/ml Soln (Insulin Glargine) .... Take 30 Units Subcutaneously At Bedtime. 9)  Pen Needles 5/16" 31g X 8 Mm Misc (Insulin Pen Needle) .... Use To Inject  Insulin Once Daily 10)  Lisinopril 2.5 Mg Tabs (Lisinopril) .... Take 1 Tablet By Mouth Once A Day 11)  Tricor 145 Mg Tabs (Fenofibrate) .... Take 1 Tablet By Mouth Once A Day  Current Medications (verified): 1)  Glucotrol 10 Mg Tabs (Glipizide) .... Take One Tablet By Mouth Two Times A Day 2)  Lorazepam 1 Mg Tabs (Lorazepam) .... Take One Tablet By Mouth Every Eight Hours As Needed For Anxiety Attacks 3)  Proventil 90 Mcg/act Aers (Albuterol) .... Two Puffs Every Four Hours As Needed For Shortness of Breath 4)  Aspir-Low 81 Mg Tbec (Aspirin) .... Take 1 Tablet By Mouth Once A Day 5)  Freestyle Lite   Strp (Glucose Blood) 6)  Advair Diskus 500-50  Mcg/dose Misc (Fluticasone-Salmeterol) .... Take One Puff Twice A Day 7)  Cymbalta 60 Mg Cpep (Duloxetine Hcl) .... Take 1 Tablet By Mouth Once A Day 8)  Lantus Solostar 100 Unit/ml Soln (Insulin Glargine) .... Take 30 Units Subcutaneously At Bedtime. 9)  Pen Needles 5/16" 31g X 8 Mm Misc (Insulin Pen Needle) .... Use To Inject  Insulin Once Daily 10)  Lisinopril 2.5 Mg Tabs (Lisinopril) .... Take 1 Tablet By Mouth Once A Day 11)  Tricor 145 Mg Tabs (Fenofibrate) .... Take 1 Tablet By Mouth Once A Day  Allergies (verified): 1)  Glucophage 2)  * Avandia  Past History:  Past Medical History: Last updated: 06/14/2006 Asthma Diabetes mellitus, type II GERD H pylori infection s/p triple therapy Menorrhagia Hyperlipidemia Hepatic hemangioma  Family History: Last updated: 09/02/2008 Family History of Breast Cancer:Aunt Family History of Ovarian Cancer:Aunts, maternal Family History of Colon Polyps:Mother Family History of Diabetes: Uncle/ Maternal  Social History: Last updated: 11/25/2009 Previously drove school bus.  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  Smokes 1ppd, down to half ppd in 08/2008, back to 1 ppd in 11/2009.   Occupation: Unemployed Patient currently smokes 1 ppd Alcohol Use - no Daily Caffeine Use - 3 Illicit Drug Use - no  Risk Factors: Smoking Status: current (05/02/2010) Packs/Day: 1.0 (05/02/2010)  Family History: Reviewed history from 09/02/2008 and no changes required. Family History of Breast Cancer:Aunt Family History of Ovarian Cancer:Aunts, maternal Family History of Colon Polyps:Mother Family History of Diabetes: Uncle/ Maternal  Social History: Reviewed history from 11/25/2009 and no changes required. Previously drove school bus.  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  Smokes 1ppd, down to half ppd in 08/2008, back to 1 ppd in 11/2009.   Occupation: Unemployed Patient currently smokes 1 ppd Alcohol Use - no Daily Caffeine Use - 3 Illicit  Drug Use - no  Review of Systems       The patient complains of dyspnea on exertion and depression.  The patient denies fever, chest pain, syncope, prolonged cough, headaches, abdominal pain, melena, and hematochezia.    Physical Exam  General:  alert, well-developed, well-nourished, well-hydrated, and overweight-appearing.  alert, well-developed, well-nourished, and well-hydrated.   Nose:  no nasal discharge.  no nasal discharge.   Mouth:  pharynx pink and moist.  pharynx pink and moist.   Neck:  supple.  supple.   Lungs:  normal respiratory effort, normal breath sounds, no crackles, and no wheezes.  normal respiratory effort, normal breath sounds, no crackles, and no wheezes.   Heart:  normal rate, regular rhythm, no murmur, and no JVD.  normal rate, regular rhythm, no murmur, and no JVD.  Abdomen:  soft, non-tender, normal bowel sounds, and no distention.  soft, non-tender, normal bowel sounds, and no distention.   Msk:  normal ROM, no joint tenderness, no joint swelling, and no joint warmth.  normal ROM, no joint tenderness, no joint swelling, and no joint warmth.   Pulses:  2+ Extremities:  No edema.  Neurologic:  alert & oriented X3, cranial nerves II-XII intact, strength normal in all extremities, sensation intact to light touch, and gait normal.  alert & oriented X3, cranial nerves II-XII intact, strength normal in all extremities, sensation intact to light touch, and gait normal.     Impression & Recommendations:  Problem # 1:  HYPERTRIGLYCERIDEMIA (ICD-272.1) Assessment Unchanged She started tricor 3 months ago and no muscle or abdominal pain. Will Recheck FLP and CMET. Encourages her to exercise and weight loss, quit smoking.  Her updated medication list for this problem includes:    Tricor 145 Mg Tabs (Fenofibrate) .Marland Kitchen... Take 1 tablet by mouth once a day  Labs Reviewed: SGOT: 16 (11/25/2009)   SGPT: 16 (11/25/2009)   HDL:26 (03/11/2010), 26 (11/25/2009)  LDL:NOT CALC mg/dL  (13/01/6577), NOT CALC mg/dL (46/96/2952)  WUXL:244 (03/11/2010), 313 (11/25/2009)  Trig:3200 (03/11/2010), 2464 (11/25/2009)  Orders: T-CMP with Estimated GFR ([address])  Problem # 2:  ANXIETY DEPRESSION (ICD-300.4) Assessment: Deteriorated Her depression is slightly worse after running out of her meds for 3 weeks. But no SI/HI, will refill these for her.   Problem # 3:  TOBACCO USER (ICD-305.1) Assessment: Comment Only  Encouraged smoking cessation and discussed different methods for smoking cessation. She understands this and would like to cut in the future.   Problem # 4:  DIABETES MELLITUS, TYPE II (ICD-250.00) Assessment: Unchanged Her CBG has been stable, most about 130 and fluctuates with diet. Will continue current regimen and advised her exercise and weight loss in addition diet. Will have DM education.  Her updated medication list for this problem includes:    Glucotrol 10 Mg Tabs (Glipizide) .Marland Kitchen... Take one tablet by mouth two times a day    Aspir-low 81 Mg Tbec (Aspirin) .Marland Kitchen... Take 1 tablet by mouth once a day    Lantus Solostar 100 Unit/ml Soln (Insulin glargine) .Marland Kitchen... Take 30 units subcutaneously at bedtime.    Lisinopril 2.5 Mg Tabs (Lisinopril) .Marland Kitchen... Take 1 tablet by mouth once a day  Orders: T- Capillary Blood Glucose (64403) Diabetic Clinic Referral (Diabetic)  Labs Reviewed: Creat: 0.60 (11/25/2009)    Reviewed HgBA1c results: 9.6 (03/01/2010)  8.5 (11/25/2009)  Her updated medication list for this problem includes:    Glucotrol 10 Mg Tabs (Glipizide) .Marland Kitchen... Take one tablet by mouth two times a day    Aspir-low 81 Mg Tbec (Aspirin) .Marland Kitchen... Take 1 tablet by mouth once a day    Lantus Solostar 100 Unit/ml Soln (Insulin glargine) .Marland Kitchen... Take 30 units subcutaneously at bedtime.    Lisinopril 2.5 Mg Tabs (Lisinopril) .Marland Kitchen... Take 1 tablet by mouth once a day  Complete Medication List: 1)  Glucotrol 10 Mg Tabs (Glipizide) .... Take one tablet by mouth two times a  day 2)  Lorazepam 1 Mg Tabs (Lorazepam) .... Take one tablet by mouth every eight hours as needed for anxiety attacks 3)  Proventil 90 Mcg/act Aers (Albuterol) .... Two puffs every four hours as needed for shortness of breath 4)  Aspir-low 81 Mg Tbec (Aspirin) .... Take 1 tablet by mouth once a day 5)  Freestyle Lite Strp (Glucose blood) 6)  Advair Diskus 500-50 Mcg/dose Misc (  Fluticasone-salmeterol) .... Take one puff twice a day 7)  Cymbalta 60 Mg Cpep (Duloxetine hcl) .... Take 1 tablet by mouth once a day 8)  Lantus Solostar 100 Unit/ml Soln (Insulin glargine) .... Take 30 units subcutaneously at bedtime. 9)  Pen Needles 5/16" 31g X 8 Mm Misc (Insulin pen needle) .... Use to inject  insulin once daily 10)  Lisinopril 2.5 Mg Tabs (Lisinopril) .... Take 1 tablet by mouth once a day 11)  Tricor 145 Mg Tabs (Fenofibrate) .... Take 1 tablet by mouth once a day  Other Orders: T-Lipid Profile ([id number])  Patient Instructions: 1)  Please schedule a follow-up appointment in 4 months. 2)  Tobacco is very bad for your health and your loved ones! You Should stop smoking!. 3)  Stop Smoking Tips: Choose a Quit date. Cut down before the Quit date. decide what you will do as a substitute when you feel the urge to smoke(gum,toothpick,exercise). 4)  It is important that you exercise regularly at least 20 minutes 5 times a week. If you develop chest pain, have severe difficulty breathing, or feel very tired , stop exercising immediately and seek medical attention. 5)  You need to lose weight. Consider a lower calorie diet and regular exercise.  Prescriptions: LORAZEPAM 1 MG TABS (LORAZEPAM) Take one tablet by mouth every eight hours as needed for anxiety attacks  #60 x 3   Entered and Authorized by:   Jackson Latino MD   Signed by:   Jackson Latino MD on 05/02/2010   Method used:   Print then Give to Patient   RxID:   [id number] CYMBALTA 60 MG CPEP (DULOXETINE HCL) Take 1 tablet by mouth once  a day  #30 x 3   Entered and Authorized by:   Jackson Latino MD   Signed by:   Jackson Latino MD on 05/02/2010   Method used:   Print then Give to Patient   RxID:   [id number]    Orders Added: 1)  T-Lipid Profile [[id number]] 2)  T-CMP with Estimated GFR [[id number]] 3)  T- Capillary Blood Glucose [82948] 4)  Est. Patient Level IV [34742] 5)  Diabetic Clinic Referral [Diabetic]   Process Orders Check Orders Results:     [facility]: ABN not required for this insurance Tests Sent for requisitioning (May 02, 2010 10:07 AM):     05/02/2010: [facility] -- T-Lipid Profile [phone] (signed)     05/02/2010: [facility] -- T-CMP with Estimated GFR [[id number]] (signed)     Prevention & Chronic Care Immunizations   Influenza vaccine: Fluvax Non-MCR  (03/01/2010)    Tetanus booster: Not documented   Td booster deferral: Refused  (09/01/2009)    Pneumococcal vaccine: Not documented  Other Screening   Pap smear: NEGATIVE FOR INTRAEPITHELIAL LESIONS OR MALIGNANCY.  (12/01/2008)   Pap smear action/deferral: Deferred  (05/02/2010)   Pap smear due: 12/01/2009    Mammogram: Normal  (12/25/2005)   Mammogram action/deferral: Ordered  (09/01/2009)   Smoking status: current  (05/02/2010)   Smoking cessation counseling: yes  (05/02/2010)  Diabetes Mellitus   HgbA1C: 9.6  (03/01/2010)   HgbA1C action/deferral: Ordered  (09/01/2009)    Eye exam: Not documented   Diabetic eye exam action/deferral: Not indicated  (09/01/2009)    Foot exam: yes  (09/01/2009)   Foot exam action/deferral: Do today   High risk foot: Not documented   Foot care education: Done  (09/01/2009)    Urine microalbumin/creatinine ratio: 942.5  (11/25/2009)  Urine microalbumin action/deferral: Ordered    Diabetes flowsheet reviewed?: Yes   Progress toward A1C goal: Deteriorated  Lipids   Total Cholesterol: 334  (03/11/2010)   Lipid panel  action/deferral: Lipid Panel ordered   LDL: NOT CALC mg/dL  (29/56/2130)   LDL Direct: Not documented   HDL: 26  (03/11/2010)   Triglycerides: 3200  (03/11/2010)    SGOT (AST): 16  (11/25/2009)   BMP action: Ordered   SGPT (ALT): 16  (11/25/2009)   Alkaline phosphatase: 114  (11/25/2009)   Total bilirubin: 0.3  (11/25/2009)   Liver panel due: 10/02/2009    Lipid flowsheet reviewed?: Yes   Progress toward LDL goal: Unchanged  Self-Management Support :   Personal Goals (by the next clinic visit) :     Personal A1C goal: 8  (03/09/2009)     Personal blood pressure goal: 130/80  (09/01/2009)     Personal LDL goal: 100  (03/09/2009)    Patient will work on the following items until the next clinic visit to reach self-care goals:     Medications and monitoring: take my medicines every day, check my blood sugar, examine my feet every day  (05/02/2010)     Eating: drink diet soda or water instead of juice or soda, eat more vegetables, use fresh or frozen vegetables, eat foods that are low in salt, eat fruit for snacks and desserts, limit or avoid alcohol  (05/02/2010)     Activity: take a 30 minute walk every day  (03/01/2010)     Other: machine broke, unable to test  (11/25/2009)    Diabetes self-management support: Resources for patients handout  (05/02/2010)   Last diabetes self-management training by diabetes educator: 04/19/2009   Referred for diabetes self-mgmt training.    Lipid self-management support: Resources for patients handout  (05/02/2010)        Resource handout printed.

## 2010-07-26 NOTE — Assessment & Plan Note (Signed)
Summary: F/U/EST/VS   Vital Signs:  Patient profile:   50 year old female Height:      63 inches Weight:      194 pounds BMI:     34.49 Temp:     99.5 degrees F oral Pulse rate:   98 / minute BP sitting:   118 / 77  (right arm)  Vitals Entered By: Filomena Jungling NT II (November 25, 2009 8:57 AM) CC: Depression, follow-up visit Is Patient Diabetic? Yes Did you bring your meter with you today? No Nutritional Status BMI of > 30 = obese CBG Result 316  Have you ever been in a relationship where you felt threatened, hurt or afraid?No   Does patient need assistance? Functional Status Self care Ambulation Normal   Primary Care Provider:  Nilda Riggs MD  CC:  Depression and follow-up visit.  History of Present Illness: Patient reports that her glucometer has been broken for some time. She was increasing lantus dose and was up to 30 units at bedtime until it broke, slowly titrating up from 18 units at bedtime. She states that she has been adherent to medication regimen for diabetes and denies any vision changes or problems with numbness or other sensory deficits.  Feels like swelling behind L knee is getting harder and possibly increasing in size. Missed her sports medicine appointment as the Cymbalta helped with shoulder pain and did not have the mass evaluated further. She also reports pain in the R knee that isn't present initially in the morning, but seems to get worse with use throughout the day. She takes tylenol and advil, alternting doses, for relief. This regimen seems to alleviate much of the pain that she experiences in the R knee.  Depression History:      The patient denies a depressed mood most of the day and a diminished interest in her usual daily activities.         Preventive Screening-Counseling & Management  Alcohol-Tobacco     Smoking Cessation Counseling: yes  Current Medications (verified): 1)  Glucotrol 10 Mg Tabs (Glipizide) .... Take One Tablet By Mouth  Two Times A Day 2)  Pravachol 20 Mg Tabs (Pravastatin Sodium) .... Take 1 Tablet By Mouth Once A Day 3)  Lorazepam 1 Mg Tabs (Lorazepam) .... Take One Tablet By Mouth Every Eight Hours As Needed For Anxiety Attacks 4)  Proventil 90 Mcg/act Aers (Albuterol) .... Two Puffs Every Four Hours As Needed For Shortness of Breath 5)  Aspir-Low 81 Mg Tbec (Aspirin) .... Take 1 Tablet By Mouth Once A Day 6)  Freestyle Lite   Strp (Glucose Blood) 7)  Advair Diskus 500-50 Mcg/dose Misc (Fluticasone-Salmeterol) .... Take One Puff Twice A Day 8)  Cymbalta 60 Mg Cpep (Duloxetine Hcl) .... Take 1 Tablet By Mouth Once A Day 9)  Lantus Solostar 100 Unit/ml Soln (Insulin Glargine) .... Take 30 Units Subcutaneously At Bedtime. 10)  Pen Needles 5/16" 31g X 8 Mm Misc (Insulin Pen Needle) .... Use To Inject  Insulin Once Daily  Allergies (verified): 1)  Glucophage 2)  * Avandia  Past History:  Social History: Last updated: 11/25/2009 Previously drove school bus.  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  Smokes 1ppd, down to half ppd in 08/2008, back to 1 ppd in 11/2009.   Occupation: Unemployed Patient currently smokes 1 ppd Alcohol Use - no Daily Caffeine Use - 3 Illicit Drug Use - no  Social History: Previously drove school bus.  Now stays home  with her grandchildren.  Smokes 1ppd, down to half ppd in 08/2008, back to 1 ppd in 11/2009.   Occupation: Unemployed Patient currently smokes 1 ppd Alcohol Use - no Daily Caffeine Use - 3 Illicit Drug Use - no  Review of Systems      See HPI  Physical Exam  General:  alert, well-developed, well-nourished, and well-hydrated.   Head:  normocephalic and atraumatic.   Eyes:  vision grossly intact, pupils equal, and pupils round.   Ears:  no external deformities.   Nose:  no external deformity, no external erythema, and no nasal discharge.   Lungs:  normal respiratory effort, normal breath sounds, no crackles, and no wheezes.   Heart:  normal rate, regular  rhythm, no murmur, no gallop, and no rub.   Abdomen:  soft, non-tender, no guarding, and no rigidity.   Msk:  1-2cm mass on medial edge of popliteal fossa o f L knee. Seems unchanged from previous examinations. No overlying erythema, warmth or skin breakdown/drainage. Neurologic:  alert & oriented X3, cranial nerves II-XII intact, and gait normal.   Skin:  turgor normal, color normal, and no rashes.   Psych:  Oriented X3, memory intact for recent and remote, normally interactive, good eye contact, not anxious appearing, and not depressed appearing.     Impression & Recommendations:  Problem # 1:  DIABETES MELLITUS, TYPE II (ICD-250.00) Patient with improved A1c today. Congratulated patient on success with reduction of A1c. I believe that patient would have done even better if she would have had her glucometer to continue upward titration of lantus. Told patient on Reli-on glucometer from [facility] which is cheaper and whose test strips are also more affordable. She agreed to go there and use that glucometer as she has several different glucometers at home, but the test strips are all cost-prohibitive at about $1 per strip. Told patient to continue to increase her Lantus dose by 2 units every three days until blood sugar in the morning is at 130.  Her updated medication list for this problem includes:    Glucotrol 10 Mg Tabs (Glipizide) .Marland Kitchen... Take one tablet by mouth two times a day    Aspir-low 81 Mg Tbec (Aspirin) .Marland Kitchen... Take 1 tablet by mouth once a day    Lantus Solostar 100 Unit/ml Soln (Insulin glargine) .Marland Kitchen... Take 30 units subcutaneously at bedtime.  Orders: T- Capillary Blood Glucose ([id number]) T-Hgb A1C (in-house) (62130QM) T-Urine Microalbumin w/creat. ratio [phone])  Labs Reviewed: Creat: 0.97 (09/29/2008)    Reviewed HgBA1c results: 8.5 (11/25/2009)  9.3 (09/01/2009)  Problem # 2:  ? of POPLITEAL CYST, LEFT (ICD-727.51) Patient still bothered by questionable cyst in L  popliteal fossa. Seems firm and unchanged from previous physical examinations. I believe that her knee pain is not from the mass as she points to the knee joint space when discussing knee pain - seems likely related to osteoarthritis given worsening with activity and no stiffness in the morning. Have told patient that we can continue to watch and wait and possible pursue ultrasound evaluation and/or surgical excision if patient continues to be bothered by this. Patient agrees to call the clinic if there are any noticeable changes in the area or if she has increased pain.  Problem # 3:  Sx of SHOULDER PAIN, LEFT, CHRONIC (ICD-719.41) Improved with Celexa, no changes at this time.  Her updated medication list for this problem includes:    Aspir-low 81 Mg Tbec (Aspirin) .Marland Kitchen... Take 1 tablet by mouth once a  day  Problem # 4:  ANXIETY DEPRESSION (ICD-300.4) Family issues are still bothering Ms. Waynette Buttery. She continues to take her celexa and benzo for relief.  Problem # 5:  TOBACCO USER (ICD-305.1) Patient continues to smoke despite several conversations directed at motivating patient to quit. Once again we discussed this issue and tried to get patient to set goal quit date which she was unwilling to do. Will continue to address this in the future.  Problem # 6:  HYPERLIPIDEMIA (ICD-272.4) Checking lipid panel today.Will increase statin if indicated. Her updated medication list for this problem includes:    Pravachol 20 Mg Tabs (Pravastatin sodium) .Marland Kitchen... Take 1 tablet by mouth once a day  Orders: T-Lipid Profile [phone])  Labs Reviewed: SGOT: 14 (07/23/2008)   SGPT: 12 (07/23/2008)   HDL:27 (09/29/2008), 25 (11/26/2007)  LDL:See Comment mg/dL (09/81/1914), 782 (95/62/1308)  Chol:186 (09/29/2008), 218 (11/26/2007)  Trig:609 (09/29/2008), 350 (11/26/2007)  Problem # 7:  Preventive Health Care (ICD-V70.0) Patient missed appointment for mammogram, when offered to make another appointment for the  patient, she refused and said that she would make her own appointment. Should ask if she was able to do this at next appointment.  Complete Medication List: 1)  Glucotrol 10 Mg Tabs (Glipizide) .... Take one tablet by mouth two times a day 2)  Pravachol 20 Mg Tabs (Pravastatin sodium) .... Take 1 tablet by mouth once a day 3)  Lorazepam 1 Mg Tabs (Lorazepam) .... Take one tablet by mouth every eight hours as needed for anxiety attacks 4)  Proventil 90 Mcg/act Aers (Albuterol) .... Two puffs every four hours as needed for shortness of breath 5)  Aspir-low 81 Mg Tbec (Aspirin) .... Take 1 tablet by mouth once a day 6)  Freestyle Lite Strp (Glucose blood) 7)  Advair Diskus 500-50 Mcg/dose Misc (Fluticasone-salmeterol) .... Take one puff twice a day 8)  Cymbalta 60 Mg Cpep (Duloxetine hcl) .... Take 1 tablet by mouth once a day 9)  Lantus Solostar 100 Unit/ml Soln (Insulin glargine) .... Take 30 units subcutaneously at bedtime. 10)  Pen Needles 5/16" 31g X 8 Mm Misc (Insulin pen needle) .... Use to inject  insulin once daily  Other Orders: T-Comprehensive Metabolic Panel ([id number])  Patient Instructions: 1)  Please schedule a follow-up appointment in 3 months. 2)  Tobacco is very bad for your health and your loved ones! You Should stop smoking!. 3)  Stop Smoking Tips: Choose a Quit date. Cut down before the Quit date. decide what you will do as a substitute when you feel the urge to smoke(gum,toothpick,exercise). 4)  It is important that your Diabetic A1c level is checked every 3 months. 5)  See your eye doctor yearly to check for diabetic eye damage. 6)  Check your feet each night for sore areas, calluses or signs of infection. Prescriptions: LANTUS SOLOSTAR 100 UNIT/ML SOLN (INSULIN GLARGINE) Take 30 units subcutaneously at bedtime.  #1 x 2   Entered and Authorized by:   Nilda Riggs MD   Signed by:   Nilda Riggs MD on 11/25/2009   Method used:   Print then Give to Patient   RxID:    [id number] CYMBALTA 60 MG CPEP (DULOXETINE HCL) Take 1 tablet by mouth once a day  #30 x 2   Entered and Authorized by:   Nilda Riggs MD   Signed by:   Nilda Riggs MD on 11/25/2009   Method used:   Print then Give to Patient   RxID:   [id number] OVFIEPPIR  10 MG TABS (GLIPIZIDE) Take one tablet by mouth two times a day  #60 x 3   Entered and Authorized by:   Nilda Riggs MD   Signed by:   Nilda Riggs MD on 11/25/2009   Method used:   Print then Give to Patient   RxID:   [id number] LORAZEPAM 1 MG TABS (LORAZEPAM) Take one tablet by mouth every eight hours as needed for anxiety attacks  #60 x 2   Entered and Authorized by:   Nilda Riggs MD   Signed by:   Nilda Riggs MD on 11/25/2009   Method used:   Print then Give to Patient   RxID:   [id number]   Prevention & Chronic Care Immunizations   Influenza vaccine: Fluvax 3+  (04/12/2009)    Tetanus booster: Not documented   Td booster deferral: Refused  (09/01/2009)    Pneumococcal vaccine: Not documented  Other Screening   Pap smear: NEGATIVE FOR INTRAEPITHELIAL LESIONS OR MALIGNANCY.  (12/01/2008)   Pap smear due: 12/01/2009    Mammogram: Normal  (12/25/2005)   Mammogram action/deferral: Ordered  (09/01/2009)   Smoking status: current  (09/01/2009)   Smoking cessation counseling: yes  (11/25/2009)    Screening comments: Ms. Hovatter says that she will reschedule mammogram as she was unable to make other appointment due to car trouble.  Diabetes Mellitus   HgbA1C: 8.5  (11/25/2009)   HgbA1C action/deferral: Ordered  (09/01/2009)    Eye exam: Not documented   Diabetic eye exam action/deferral: Not indicated  (09/01/2009)    Foot exam: yes  (09/01/2009)   Foot exam action/deferral: Do today   High risk foot: Not documented   Foot care education: Done  (09/01/2009)    Urine microalbumin/creatinine ratio: 24.3  (09/29/2008)   Urine microalbumin action/deferral: Ordered    Diabetes flowsheet  reviewed?: Yes   Progress toward A1C goal: Improved  Lipids   Total Cholesterol: 186  (09/29/2008)   Lipid panel action/deferral: Lipid Panel ordered   LDL: See Comment mg/dL  (84/69/6295)   LDL Direct: Not documented   HDL: 27  (09/29/2008)   Triglycerides: 609  (09/29/2008)    SGOT (AST): 14  (07/23/2008)   BMP action: Ordered   SGPT (ALT): 12  (07/23/2008) CMP ordered    Alkaline phosphatase: 93  (07/23/2008)   Total bilirubin: 0.4  (07/23/2008)   Liver panel due: 10/02/2009    Lipid flowsheet reviewed?: Yes   Progress toward LDL goal: Unchanged  Self-Management Support :   Personal Goals (by the next clinic visit) :     Personal A1C goal: 8  (03/09/2009)     Personal blood pressure goal: 130/80  (09/01/2009)     Personal LDL goal: 100  (03/09/2009)    Patient will work on the following items until the next clinic visit to reach self-care goals:     Medications and monitoring: take my medicines every day, examine my feet every day  (11/25/2009)     Eating: drink diet soda or water instead of juice or soda, eat more vegetables, eat foods that are low in salt, eat baked foods instead of fried foods  (11/25/2009)     Activity: take a 30 minute walk every day  (03/09/2009)     Other: machine broke, unable to test  (11/25/2009)    Diabetes self-management support: Education handout, Resources for patients handout, Written self-care plan  (11/25/2009)   Diabetes care plan printed   Diabetes education handout printed   Last diabetes self-management training  by diabetes educator: 04/19/2009    Lipid self-management support: Education handout, Resources for patients handout, Written self-care plan  (11/25/2009)   Lipid self-care plan printed.   Lipid education handout printed      Resource handout printed.   Process Orders Check Orders Results:     [facility]: ABN not required for this insurance Tests Sent for requisitioning (November 25, 2009 1:31 PM):      11/25/2009: [facility] -- T-Urine Microalbumin w/creat. ratio [[id number]] (signed)     11/25/2009: [facility] -- T-Lipid Profile [phone] (signed)     11/25/2009: [facility] -- T-Comprehensive Metabolic Panel [phone] (signed)    Laboratory Results   Blood Tests   Date/Time Received: November 25, 2009 9:33 AM Date/Time Reported: Alric Quan  November 25, 2009 9:33 AM   HGBA1C: 8.5%   (Normal Range: Non-Diabetic - 3-6%   Control Diabetic - 6-8%) CBG Random:: 316mg /dL

## 2010-07-26 NOTE — Miscellaneous (Signed)
Summary: [facility]  [facility]   Imported By: Margie Billet 05/23/2010 09:07:35  _____________________________________________________________________  External Attachment:    Type:   Image     Comment:   External Document

## 2010-07-26 NOTE — Progress Notes (Signed)
Summary: diabetes support/dmr  Phone Note Call from Patient   Summary of Call: Patient presented to lab for labwork and requested pen needle samples. says she was charged 58.00 dollars for box of 100 last time she bought them. I gave her information on [facility]- generic brand that should cost less than 20.00 for a box of 100 needles. 10 pen needle samples given  today. Initial call taken by: Jamison Neighbor RD,CDE,  March 11, 2010 11:41 AM

## 2010-07-26 NOTE — Progress Notes (Signed)
Summary: refill/ hla  Phone Note Refill Request Message from:  Fax from Pharmacy on July 16, 2009 6:04 PM  Refills Requested: Medication #1:  LORAZEPAM 1 MG TABS Take one tablet by mouth every eight hours as needed for anxiety attacks   Dosage confirmed as above?Dosage Confirmed   Supply Requested: 3 months   Last Refilled: 12/8 last office visit 03/2009, no show dec appt  Initial call taken by: Marin Roberts RN,  July 16, 2009 6:04 PM  Follow-up for Phone Call        Last refill is stated to be 06/02/09 but last refill from [place] is 03/09/2009.  This suggests she is getting lorazepam from different providers.  Will prescribe #60 with 2 refills with the hope of getting her in to see Dr. Logan Bores within the next 3 months to clarify who is prescribing the lorazepam and the actual number per month she requires to control her symptoms.  She no-showed her last appointment in December.  Please schedule Ms. Nine with Dr. Logan Bores at his next non-overbook appointment within the next 3 months and call in the prescription.  Thank You. Follow-up by: Doneen Poisson MD,  July 16, 2009 6:28 PM  Additional Follow-up for Phone Call Additional follow up Details #1::        Rx called to pharmacy Additional Follow-up by: Marin Roberts RN,  July 27, 2009 3:34 PM    New/Updated Medications: LORAZEPAM 1 MG TABS (LORAZEPAM) Take one tablet by mouth every eight hours as needed for anxiety attacks Prescriptions: LORAZEPAM 1 MG TABS (LORAZEPAM) Take one tablet by mouth every eight hours as needed for anxiety attacks  #60 x 2   Entered and Authorized by:   Doneen Poisson MD   Signed by:   Doneen Poisson MD on 07/16/2009   Method used:   Telephoned to ...       [facility] Department (retail)       [address] [place], [place]  [address]       Ph: [phone]       Fax: [phone]   RxID:   [id number]

## 2010-07-26 NOTE — Progress Notes (Signed)
Summary: refill/ hla  Phone Note Refill Request Message from:  Fax from Pharmacy on July 28, 2009 3:52 PM  Refills Requested: Medication #1:  PROVENTIL 90 MCG/ACT AERS Two puffs every four hours as needed for shortness of breath   Last Refilled: 12/1 Initial call taken by: Marin Roberts RN,  July 28, 2009 3:53 PM  Follow-up for Phone Call        Rx faxed to pharmacy.  Needs appt with PCP. Follow-up by: Margarito Liner MD,  July 28, 2009 4:19 PM  Additional Follow-up for Phone Call Additional follow up Details #1::        Rx faxed to pharmacy.  Flag to C. Boone to schedule an appointment. Additional Follow-up by: Angelina Ok RN,  July 29, 2009 3:52 PM    Prescriptions: PROVENTIL 90 MCG/ACT AERS (ALBUTEROL) Two puffs every four hours as needed for shortness of breath  #1 MDI x 6   Entered and Authorized by:   Margarito Liner MD   Signed by:   Margarito Liner MD on 07/28/2009   Method used:   Faxed to ...       [facility] Department (retail)       [address] [place], [place]  [address]       Ph: [phone]       Fax: [phone]   RxID:   [id number]

## 2010-07-26 NOTE — Progress Notes (Signed)
Summary: refill/gg  Phone Note Refill Request  on August 24, 2009 4:18 PM  Refills Requested: Medication #1:  ADVAIR DISKUS 500-50 MCG/DOSE MISC Take two puffs twice a day   Last Refilled: 06/14/2009  Method Requested: Fax to Local Pharmacy Initial call taken by: Merrie Roof RN,  August 24, 2009 4:19 PM    Prescriptions: ADVAIR DISKUS 500-50 MCG/DOSE MISC (FLUTICASONE-SALMETEROL) Take two puffs twice a day  #1 x 11   Entered by:   Zoila Shutter MD   Authorized by:   Marland Kitchen [facility] ATTENDING DESKTOP   Signed by:   Zoila Shutter MD on 08/24/2009   Method used:   Telephoned to ...       [facility] Department (retail)       [address] [place], [place]  [address]       Ph: [phone]       Fax: [phone]   RxID:   [id number]   Appended Document: refill/gg Pharmacy called regarding concern over the dosage of Advair.  Reiewed chart.  Pt was on 250/50 2 puffs two times a day (total of 1000 mcg of fluticasone - the max dose for asthma maintance).  On 4/10 visit increased freq of signs was noted and dose increased to 500/50 2 puffs two times a day (total of 2000 micrograms of fluticasone or DOUBLE the max dose).  Since she was on a total of 4 puffs per day of the 250/50 strength she was on the max dose at that time.  Therefore, will change the RX to Advair 500/50 one puff twice daily.  This is the max dose and is the equivalent of her old dose of 250/50 2 puffs two times a day.  If her signs  increase on this dosage other tx options will need to be explored.     Appended Document: refill/gg    Clinical Lists Changes  Medications: Changed medication from ADVAIR DISKUS 500-50 MCG/DOSE MISC (FLUTICASONE-SALMETEROL) Take two puffs twice a day to ADVAIR DISKUS 500-50 MCG/DOSE MISC (FLUTICASONE-SALMETEROL) Take one puff twice a day - Signed Rx of ADVAIR DISKUS 500-50 MCG/DOSE MISC (FLUTICASONE-SALMETEROL) Take one puff twice a day;  #1 x 5;  Signed;  Entered by:  Blanch Media MD;  Authorized by: Blanch Media MD;  Method used: Historical    Prescriptions: ADVAIR DISKUS 500-50 MCG/DOSE MISC (FLUTICASONE-SALMETEROL) Take one puff twice a day  #1 x 5   Entered and Authorized by:   Blanch Media MD   Signed by:   Blanch Media MD on 09/10/2009   Method used:   Historical   RxID:   [id number]    Appended Document: refill/gg The change in dose of med was called into [facility]

## 2010-07-26 NOTE — Progress Notes (Signed)
----   Converted from flag ---- ---- 03/02/2010 9:48 AM, Chinita Pester RN wrote: Pt. was called and instructed to check CBG's per Dr. Loistine Chance; she agreed.  ---- 03/01/2010 6:08 PM, Almyra Deforest MD wrote: Can you please call Wille Glaser and ask her to check her blood sugars before and 2hours after meals three times a day for the next 2 weeks  and bring in ther reading/meter for her next visit. Thank you. ------------------------------

## 2010-07-26 NOTE — Progress Notes (Signed)
Summary: change meds/ hla  Phone Note From Pharmacy   Summary of Call: [facility] get Edwina Barth so will you change this to tricor that they can get, 145mg ? please advise Initial call taken by: Marin Roberts RN,  January 07, 2010 2:51 PM  Follow-up for Phone Call        Will change. Follow-up by: Julaine Fusi  DO,  January 07, 2010 4:33 PM    New/Updated Medications: TRICOR 145 MG TABS (FENOFIBRATE) Take 1 tablet by mouth once a day Prescriptions: TRICOR 145 MG TABS (FENOFIBRATE) Take 1 tablet by mouth once a day  #30 x 3   Entered and Authorized by:   Julaine Fusi  DO   Signed by:   Julaine Fusi  DO on 01/07/2010   Method used:   Faxed to ...       [facility] Department (retail)       [address] [place], [place]  [address]       Ph: [phone]       Fax: [phone]   RxID:   [id number]

## 2010-07-26 NOTE — Assessment & Plan Note (Signed)
Summary: est-ck/fu/meds/cfb   Vital Signs:  Patient profile:   50 year old female Height:      63 inches (160.02 cm) Weight:      184.6 pounds (83.91 kg) BMI:     32.82 Temp:     98.1 degrees F oral Pulse rate:   91 / minute BP sitting:   127 / 81  (right arm)  Vitals Entered By: Chinita Pester RN (March 01, 2010 3:52 PM) CC: Meet new MD.  Med refills.  Check-up. Check "knot" back of left leg. States she never taken Tricor. Is Patient Diabetic? Yes Did you bring your meter with you today? No Pain Assessment Patient in pain? no      Nutritional Status BMI of > 30 = obese CBG Result 180  Have you ever been in a relationship where you felt threatened, hurt or afraid?No   Does patient need assistance? Functional Status Self care Ambulation Normal   Primary Care Provider:  Almyra Deforest MD  CC:  Meet new MD.  Med refills.  Check-up. Check "knot" back of left leg. States she never taken Tricor.Marland Kitchen  History of Present Illness: This is 50 year old female with PMH including Asthma, Depression, DM, HDL (Trigl elevated) who presents for a regular visit. She noted that there is bump in her knee groove which is occasionally painfull.  1. DM , uncontrolled. currently on Glipizide 10 mg two times a day and lantus 30 units at bedtime. Hgb A1c is up to 9.6   2. Hypertriglyceride she was not able to get the Tricor  3. Bump in the groove: painful occasionally and is growing very slowly.   Depression History:      The patient denies a depressed mood most of the day and a diminished interest in her usual daily activities.              Preventive Screening-Counseling & Management  Alcohol-Tobacco     Alcohol drinks/day: 0     Smoking Status: current     Smoking Cessation Counseling: yes     Packs/Day: 1.0     Year Started: at the age of 55  Caffeine-Diet-Exercise     Does Patient Exercise: no  Current Medications (verified): 1)  Glucotrol 10 Mg Tabs (Glipizide) .... Take One  Tablet By Mouth Two Times A Day 2)  Lorazepam 1 Mg Tabs (Lorazepam) .... Take One Tablet By Mouth Every Eight Hours As Needed For Anxiety Attacks 3)  Proventil 90 Mcg/act Aers (Albuterol) .... Two Puffs Every Four Hours As Needed For Shortness of Breath 4)  Aspir-Low 81 Mg Tbec (Aspirin) .... Take 1 Tablet By Mouth Once A Day 5)  Freestyle Lite   Strp (Glucose Blood) 6)  Advair Diskus 500-50 Mcg/dose Misc (Fluticasone-Salmeterol) .... Take One Puff Twice A Day 7)  Cymbalta 60 Mg Cpep (Duloxetine Hcl) .... Take 1 Tablet By Mouth Once A Day 8)  Lantus Solostar 100 Unit/ml Soln (Insulin Glargine) .... Take 30 Units Subcutaneously At Bedtime. 9)  Pen Needles 5/16" 31g X 8 Mm Misc (Insulin Pen Needle) .... Use To Inject  Insulin Once Daily 10)  Lisinopril 2.5 Mg Tabs (Lisinopril) .... Take 1 Tablet By Mouth Once A Day  Allergies: 1)  Glucophage 2)  * Avandia  Social History: Packs/Day:  1.0  Physical Exam  General:  Well-developed,well-nourished,in no acute distress; alert,appropriate and cooperative throughout examination Neck:  No deformities, masses, or tenderness noted. Lungs:  Normal respiratory effort, chest expands symmetrically. Lungs are  clear to auscultation, no crackles or wheezes. Heart:  Normal rate and regular rhythm. S1 and S2 normal without gallop, murmur, click, rub or other extra sounds. Abdomen:  Bowel sounds positive,abdomen soft and non-tender without masses, organomegaly or hernias noted. Pulses:  R and L carotid,radial,dorsalis pedis and posterior tibial pulses are full and equal bilaterally Extremities:  No clubbing, cyanosis, edema, or deformity noted with normal full range of motion of all joints.   Skin:  firm, no pulsation cystlike lesion in the poplitea fossa   Impression & Recommendations:  Problem # 1:  HYPERTRIGLYCERIDEMIA (ICD-272.1)  At the last visit Triglyceride were elevated to 2404 , HDL 26 and total cholesterol 313. In April of this year her  Triglyceride were 609. She has been not taking tricor since she had trouble to receive it from thepharmacy. I pointed the importance of compliance and described the effects of increased triglyceride. She wanted to try to get it.   Her updated medication list for this problem includes:    Tricor 145 Mg Tabs (Fenofibrate) .Marland Kitchen... Take 1 tablet by mouth once a day  Future Orders: T-Lipid Profile ([id number]) ... 03/08/2010  Problem # 2:  DIABETES MELLITUS, TYPE II (ICD-250.00) Patient HgbA1c has been elevated from 9.9 from 8.5 in June. She noted that she is taking her medication. She did not bring her meter with her to see when she does high level of glucose and if she has any lows. She lost around 10 pounds and mentioned she is doing some walking. Her BP has been well controlled. I emphasised the importance about the glucose control and want to see her in 2 weeks and have her bring in her meter to make some changes for a better control.  Her updated medication list for this problem includes:    Glucotrol 10 Mg Tabs (Glipizide) .Marland Kitchen... Take one tablet by mouth two times a day    Aspir-low 81 Mg Tbec (Aspirin) .Marland Kitchen... Take 1 tablet by mouth once a day    Lantus Solostar 100 Unit/ml Soln (Insulin glargine) .Marland Kitchen... Take 30 units subcutaneously at bedtime.    Lisinopril 2.5 Mg Tabs (Lisinopril) .Marland Kitchen... Take 1 tablet by mouth once a day  Orders: T- Capillary Blood Glucose ([id number]) T-Hgb A1C (in-house) ([id number])  Problem # 3:  ? of POPLITEAL CYST, LEFT (ICD-727.51) This seems to be a cyst in the poplitea fossa, firme, no pulsation, slow growth. I recommended to continue to watch and persue with surgery excision if patient is bothered by this.    Problem # 4:  ASTHMA (ICD-493.90) Received a sample of Ventolin HFA( 1 pack)   Her updated medication list for this problem includes:    Proventil 90 Mcg/act Aers (Albuterol) .Marland Kitchen..Marland Kitchen Two puffs every four hours as needed for shortness of breath    Advair Diskus 500-50  Mcg/dose Misc (Fluticasone-salmeterol) .Marland Kitchen... Take one puff twice a day  Complete Medication List: 1)  Glucotrol 10 Mg Tabs (Glipizide) .... Take one tablet by mouth two times a day 2)  Lorazepam 1 Mg Tabs (Lorazepam) .... Take one tablet by mouth every eight hours as needed for anxiety attacks 3)  Proventil 90 Mcg/act Aers (Albuterol) .... Two puffs every four hours as needed for shortness of breath 4)  Aspir-low 81 Mg Tbec (Aspirin) .... Take 1 tablet by mouth once a day 5)  Freestyle Lite Strp (Glucose blood) 6)  Advair Diskus 500-50 Mcg/dose Misc (Fluticasone-salmeterol) .... Take one puff twice a day 7)  Cymbalta 60 Mg Cpep (Duloxetine  hcl) .... Take 1 tablet by mouth once a day 8)  Lantus Solostar 100 Unit/ml Soln (Insulin glargine) .... Take 30 units subcutaneously at bedtime. 9)  Pen Needles 5/16" 31g X 8 Mm Misc (Insulin pen needle) .... Use to inject  insulin once daily 10)  Lisinopril 2.5 Mg Tabs (Lisinopril) .... Take 1 tablet by mouth once a day 11)  Tricor 145 Mg Tabs (Fenofibrate) .... Take 1 tablet by mouth once a day  Other Orders: Influenza Vaccine NON MCR ([id number])  Patient Instructions: 1)  Please schedule a follow-up appointment in 2 weeks. 2)  Lipid Panel prior to visit, ICD-9: 3)  Limit your Sodium (Salt). 4)  You need to lose weight. Consider a lower calorie diet and regular exercise.  5)  It is important that you exercise regularly at least 20 minutes 5 times a week. If you develop chest pain, have severe difficulty breathing, or feel very tired , stop exercising immediately and seek medical attention. 6)  Check your blood sugars regularly. If your readings are usually above : or below 70 you should contact our office.  Prevention & Chronic Care Immunizations   Influenza vaccine: Fluvax Non-MCR  (03/01/2010)    Tetanus booster: Not documented   Td booster deferral: Refused  (09/01/2009)    Pneumococcal vaccine: Not documented  Other Screening   Pap smear:  NEGATIVE FOR INTRAEPITHELIAL LESIONS OR MALIGNANCY.  (12/01/2008)   Pap smear due: 12/01/2009    Mammogram: Normal  (12/25/2005)   Mammogram action/deferral: Ordered  (09/01/2009)  Reports requested:  Smoking status: current  (03/01/2010)   Smoking cessation counseling: yes  (03/01/2010)  Diabetes Mellitus   HgbA1C: 9.6  (03/01/2010)   HgbA1C action/deferral: Ordered  (09/01/2009)    Eye exam: Not documented   Last eye exam report requested.   Diabetic eye exam action/deferral: Not indicated  (09/01/2009)    Foot exam: yes  (09/01/2009)   Foot exam action/deferral: Do today   High risk foot: Not documented   Foot care education: Done  (09/01/2009)    Urine microalbumin/creatinine ratio: 942.5  (11/25/2009)   Urine microalbumin action/deferral: Ordered    Diabetes flowsheet reviewed?: Yes   Progress toward A1C goal: Deteriorated  Lipids   Total Cholesterol: 313  (11/25/2009)   Lipid panel action/deferral: Lipid Panel ordered   LDL: NOT CALC mg/dL  (81/19/1478)   LDL Direct: Not documented   HDL: 26  (11/25/2009)   Triglycerides: 2464  (11/25/2009)    SGOT (AST): 16  (11/25/2009)   BMP action: Ordered   SGPT (ALT): 16  (11/25/2009)   Alkaline phosphatase: 114  (11/25/2009)   Total bilirubin: 0.3  (11/25/2009)   Liver panel due: 10/02/2009  Self-Management Support :   Personal Goals (by the next clinic visit) :     Personal A1C goal: 8  (03/09/2009)     Personal blood pressure goal: 130/80  (09/01/2009)     Personal LDL goal: 100  (03/09/2009)    Patient will work on the following items until the next clinic visit to reach self-care goals:     Medications and monitoring: take my medicines every day, bring all of my medications to every visit, examine my feet every day  (03/01/2010)     Eating: eat more vegetables, use fresh or frozen vegetables, eat baked foods instead of fried foods  (03/01/2010)     Activity: take a 30 minute walk every day  (03/01/2010)      Other: machine broke, unable to test  (  11/25/2009)    Diabetes self-management support: Written self-care plan  (03/01/2010)   Diabetes care plan printed   Last diabetes self-management training by diabetes educator: 04/19/2009    Lipid self-management support: Written self-care plan  (03/01/2010)   Lipid self-care plan printed.   Nursing Instructions: Give Flu vaccine today Request report of last diabetic eye exam   Process Orders Check Orders Results:     [facility]: ABN not required for this insurance Tests Sent for requisitioning (March 04, 2010 12:21 AM):     03/08/2010: [facility] -- T-Lipid Profile [phone] (signed)     Laboratory Results   Blood Tests   Date/Time Received: March 01, 2010 4:12 PM  Date/Time Reported: Burke Keels  March 01, 2010 4:12 PM   HGBA1C: 9.6%   (Normal Range: Non-Diabetic - 3-6%   Control Diabetic - 6-8%) CBG Random:: 180mg /dL      Influenza Vaccine    Vaccine Type: Fluvax Non-MCR    Site: right deltoid    Mfr: GlaxoSmithKline    Dose: 0.5 ml    Route: IM    Given by: Chinita Pester RN    Exp. Date: 12/24/2010    Lot #: [id number]    VIS given: 01/18/10 version given March 01, 2010.  Flu Vaccine Consent Questions    Do you have a history of severe allergic reactions to this vaccine? no    Any prior history of allergic reactions to egg and/or gelatin? no    Do you have a sensitivity to the preservative Thimersol? no    Do you have a past history of Guillan-Barre Syndrome? no    Do you currently have an acute febrile illness? no    Have you ever had a severe reaction to latex? no    Vaccine information given and explained to patient? yes    Are you currently pregnant? no

## 2010-07-28 NOTE — Progress Notes (Signed)
Summary: Refill/gh  Phone Note Refill Request Message from:  Fax from Pharmacy on July 18, 2010 11:58 AM  Refills Requested: Medication #1:  GLUCOTROL 10 MG TABS Take one tablet by mouth two times a day   Last Refilled: 04/11/2010 Last office visit and labs were 05/02/2010   Method Requested: Fax to Local Pharmacy Initial call taken by: Angelina Ok RN,  July 18, 2010 11:58 AM

## 2010-07-28 NOTE — Progress Notes (Signed)
Summary: Refill/gh  Phone Note Refill Request Message from:  Fax from Pharmacy on July 18, 2010 3:37 PM  Refills Requested: Medication #1:  GLUCOTROL 10 MG TABS Take one tablet by mouth two times a day   Last Refilled: 04/11/2010 Pharmacy is requesting 200 tablets. Last vist and labs were 04/2010.   Method Requested: Fax to Local Pharmacy Initial call taken by: Angelina Ok RN,  July 18, 2010 3:38 PM  Follow-up for Phone Call        Refill approved-nurse to complete Follow-up by: Julaine Fusi  DO,  July 18, 2010 5:09 PM    Prescriptions: GLUCOTROL 10 MG TABS (GLIPIZIDE) Take one tablet by mouth two times a day  #200 x 3   Entered and Authorized by:   Julaine Fusi  DO   Signed by:   Julaine Fusi  DO on 07/18/2010   Method used:   Faxed to ...       [facility] (retail)       [address] [place], [place]  [address]       Ph: [phone]       Fax: [phone]   RxID:   [id number] GLUCOTROL 10 MG TABS (GLIPIZIDE) Take one tablet by mouth two times a day  #60 x 3   Entered and Authorized by:   Julaine Fusi  DO   Signed by:   Julaine Fusi  DO on 07/18/2010   Method used:   Faxed to ...       [facility] (retail)       [address] [place], [place]  [address]       Ph: [phone]       Fax: [phone]   RxID:   [id number]

## 2010-08-18 ENCOUNTER — Other Ambulatory Visit: Payer: Self-pay | Admitting: *Deleted

## 2010-08-18 DIAGNOSIS — R062 Wheezing: Secondary | ICD-10-CM

## 2010-08-18 MED ORDER — ALBUTEROL 90 MCG/ACT IN AERS: 1.0000 | INHALATION_SPRAY | RESPIRATORY_TRACT | Status: DC | PRN

## 2010-08-18 NOTE — Telephone Encounter (Signed)
Pt usually gets 3 inhalers for 3 months but request early refill due to respiratory illness.  Can we refill early with 1 inhaler??

## 2010-08-18 NOTE — Telephone Encounter (Signed)
Rx faxed in.

## 2010-09-08 LAB — GLUCOSE, CAPILLARY: Glucose-Capillary: 180 mg/dL — ABNORMAL HIGH (ref 70–99)

## 2010-09-12 ENCOUNTER — Ambulatory Visit (INDEPENDENT_AMBULATORY_CARE_PROVIDER_SITE_OTHER): Payer: Self-pay | Admitting: Internal Medicine

## 2010-09-12 ENCOUNTER — Encounter: Payer: Self-pay | Admitting: Internal Medicine

## 2010-09-12 DIAGNOSIS — F410 Panic disorder [episodic paroxysmal anxiety] without agoraphobia: Secondary | ICD-10-CM

## 2010-09-12 DIAGNOSIS — E781 Pure hyperglyceridemia: Secondary | ICD-10-CM

## 2010-09-12 DIAGNOSIS — F411 Generalized anxiety disorder: Secondary | ICD-10-CM

## 2010-09-12 DIAGNOSIS — F341 Dysthymic disorder: Secondary | ICD-10-CM

## 2010-09-12 DIAGNOSIS — J449 Chronic obstructive pulmonary disease, unspecified: Secondary | ICD-10-CM

## 2010-09-12 DIAGNOSIS — J4489 Other specified chronic obstructive pulmonary disease: Secondary | ICD-10-CM

## 2010-09-12 DIAGNOSIS — F418 Other specified anxiety disorders: Secondary | ICD-10-CM

## 2010-09-12 DIAGNOSIS — F17200 Nicotine dependence, unspecified, uncomplicated: Secondary | ICD-10-CM

## 2010-09-12 DIAGNOSIS — E119 Type 2 diabetes mellitus without complications: Secondary | ICD-10-CM

## 2010-09-12 DIAGNOSIS — Z299 Encounter for prophylactic measures, unspecified: Secondary | ICD-10-CM

## 2010-09-12 DIAGNOSIS — D751 Secondary polycythemia: Secondary | ICD-10-CM

## 2010-09-12 DIAGNOSIS — J45909 Unspecified asthma, uncomplicated: Secondary | ICD-10-CM

## 2010-09-12 DIAGNOSIS — K219 Gastro-esophageal reflux disease without esophagitis: Secondary | ICD-10-CM

## 2010-09-12 DIAGNOSIS — E785 Hyperlipidemia, unspecified: Secondary | ICD-10-CM

## 2010-09-12 LAB — CBC
Hemoglobin: 17 g/dL — ABNORMAL HIGH (ref 12.0–15.0)
MCH: 31.8 pg (ref 26.0–34.0)
MCV: 89.5 fL (ref 78.0–100.0)
RBC: 5.35 MIL/uL — ABNORMAL HIGH (ref 3.87–5.11)

## 2010-09-12 LAB — GLUCOSE, CAPILLARY
Glucose-Capillary: 316 mg/dL — ABNORMAL HIGH (ref 70–99)
Glucose-Capillary: 267 mg/dL — ABNORMAL HIGH (ref 70–99)

## 2010-09-12 LAB — POCT GLYCOSYLATED HEMOGLOBIN (HGB A1C): Hemoglobin A1C: 8.2

## 2010-09-12 MED ORDER — FENOFIBRATE 145 MG PO TABS: 145.0000 mg | ORAL_TABLET | Freq: Every day | ORAL | Status: DC

## 2010-09-12 MED ORDER — INSULIN GLARGINE 100 UNIT/ML ~~LOC~~ SOLN: 35.0000 [IU] | Freq: Every day | SUBCUTANEOUS | Status: DC

## 2010-09-12 MED ORDER — ALPRAZOLAM 0.25 MG PO TABS: 0.2500 mg | ORAL_TABLET | Freq: Three times a day (TID) | ORAL | Status: DC | PRN

## 2010-09-12 NOTE — Progress Notes (Signed)
  Subjective:    Patient ID: Vanessa [name], female    DOB: [birth year], 50 y.o.   MRN: [id number]  HPI Comments: This is 50 year old female with PMH of Depression, Anxiety and DM, HLD who presented to the clinic for a regular visit and med refill. She noted that her sugars has been elevated since some weeks. The numbers are between 200s in the morning and during the day as high as 500. She denies any lows. She noted that she was taking all her medications. She further mentioned that her anxiety has getting progressively worth. She has now trouble to go out by herself. The idea itself causes her a panic attack. She starts to sweat and her BP goes up. Because of this she tries to stay indoor as much as possible. She further noted whenever it is bad she drinks more coffee and smokes and this seems to calm her down. She has been using Lorazepam on a regular basis but it seems that it is not helping her in any way.  Currently smokes at least 1 ppd per day.  She noted some epigastric pain which is chronic. Denies any diarrhea, constipation, fevers, chills, nausea or vomiting. Denies any SOB or chest pain.     Review of Systems  Constitutional: Negative for fever, appetite change and unexpected weight change.  Respiratory: Negative for cough and chest tightness.   Cardiovascular: Negative for chest pain.  Gastrointestinal: Positive for abdominal pain. Negative for nausea, vomiting, diarrhea, constipation and blood in stool.  Genitourinary: Negative for difficulty urinating.  Musculoskeletal: Negative for arthralgias.  Neurological: Positive for headaches. Negative for dizziness.  Psychiatric/Behavioral: Positive for sleep disturbance. Negative for suicidal ideas. The patient is nervous/anxious.        Objective:   Physical Exam  Constitutional: She is oriented to person, place, and time. She appears well-developed and well-nourished.  HENT:  Head: Normocephalic and atraumatic.  Neck: Normal range of  motion.  Cardiovascular: Normal rate and regular rhythm.   Pulmonary/Chest: Effort normal and breath sounds normal.  Abdominal: Soft. Bowel sounds are normal. She exhibits distension. There is tenderness.  Musculoskeletal: Normal range of motion.  Neurological: She is alert and oriented to person, place, and time.  Psychiatric: Her speech is normal and behavior is normal. Judgment and thought content normal. Her mood appears anxious. Her affect is not blunt and not labile. Cognition and memory are normal. She exhibits a depressed mood.          Assessment & Plan:

## 2010-09-12 NOTE — Progress Notes (Signed)
Pt does not have insurance; scholarship form completed by pt and faxed to [facility]. They will call the pt when an appt is scheduled.

## 2010-09-12 NOTE — Assessment & Plan Note (Signed)
Patient is having panic attacks especially when going to public places. Just the idea to leave the house and meet with other people causes her to sweat and she becomes very jittery. She has been taking Lorazepam on a regular basis occasionally 3 times a day to calm her day but recently it has not been working. She further increased her caffeine intake. I advised her to cut down on the caffeine intake and underlined that is contributing to the anxiety. Furthermore d/c lorazepam and started her on Xanax and advised her whenever she knows that she will get the attacks she should take the Xanax before. If this continues to worthen I would recommend she has to be evaluated by a psychiatrist for further management.

## 2010-09-12 NOTE — Assessment & Plan Note (Signed)
Patient noted that has episodes of depressed of mood but never of suicidal ideation. I would like to start on a SSRI but patient noted that she can not live without Cymbalta. There has been a trial with Prozac and Amitriptyline but it was d/c due to increased depression. Again if the symptoms worsens consider to refer to psychiatry for further management.

## 2010-09-12 NOTE — Assessment & Plan Note (Signed)
Counseled the patient concerning smoking cessation but patient is at this point not willing. She noted I can not deal with not smoking and go through my anxiety attacks.

## 2010-09-12 NOTE — Assessment & Plan Note (Addendum)
Patient is tolerating Tricor very well. Will check fasting lipid panel today since last Triglyceride was 1700 (04/2010). It has decreased from 3200 in 02/2010. Will continue Tricor at this point.    Hypertriglyceridemia: elevated compared to 04/2010 on Tricor. Reviewing uptodate and discussing with pharmacy I will add Niacin SR 500 mg po daily for 1 month and then increase to max. Dose of 2 g daily. She may never be well controlled but she has an increased risk of CAD especially in the setting of Diabetes and pancreatitis. Furthermore I will check microalbumin to creatine ratio and TSH at the next visit.

## 2010-09-12 NOTE — Assessment & Plan Note (Signed)
Blood sugars with 200-500s not well controlled. Hgb A1c is 8. 2 which has improved since 02/2010 (Hgb A1c of 9.6). At this point I will increase Lantus to 35 units QHS and continue on Glipizide. Patient was informed that she needs to check her blood sugars on a regular morning before breakfast. She should call the clinic if she notices that is having low blood sugars. I would at this point just continue increasing Lantus dosage until better control of blood sugars.

## 2010-09-13 ENCOUNTER — Telehealth: Payer: Self-pay | Admitting: Internal Medicine

## 2010-09-13 DIAGNOSIS — D751 Secondary polycythemia: Secondary | ICD-10-CM | POA: Insufficient documentation

## 2010-09-13 LAB — LIPID PANEL
Cholesterol: 527 mg/dL — ABNORMAL HIGH (ref 0–200)
HDL: 26 mg/dL — ABNORMAL LOW (ref >39)
Total CHOL/HDL Ratio: 20.3 Ratio
Triglycerides: 3944 mg/dL — ABNORMAL HIGH (ref <150)

## 2010-09-13 LAB — COMPREHENSIVE METABOLIC PANEL
Alkaline Phosphatase: 113 U/L (ref 39–117)
BUN: 10 mg/dL (ref 6–23)
Glucose, Bld: 203 mg/dL — ABNORMAL HIGH (ref 70–99)
Total Bilirubin: 0.5 mg/dL (ref 0.3–1.2)

## 2010-09-13 MED ORDER — NIACIN ER (ANTIHYPERLIPIDEMIC) 500 MG PO TBCR
500.0000 mg | EXTENDED_RELEASE_TABLET | Freq: Every day | ORAL | Status: DC
Start: 2010-09-13 — End: 2010-12-20

## 2010-09-13 NOTE — Assessment & Plan Note (Signed)
  Elevated Hgb and WBC which has been chronic. Elevated Hgb can be contributed to smoking, COPD or sleep apnea. Therefore this should be further evaluated at the next visit. Carboxyhemoglobin and Oxygen saturation should be checked. If secondary polycythemia was ruled out consider referal to Hem/Onc.

## 2010-09-13 NOTE — Progress Notes (Signed)
Addended by: Cristopher Estimable on: 09/13/2010 04:26 PM   Modules accepted: Orders

## 2010-09-13 NOTE — Progress Notes (Signed)
Addended by: Almyra Deforest on: 09/13/2010 04:38 PM   Modules accepted: Orders

## 2010-09-13 NOTE — Telephone Encounter (Signed)
I tried to call patient to inform patient about her lab results and that we will be starting Niacin for better control of Triglycerides. Furthermore it is important to control her diet and therefore I will be referring her to [facility]. She needs good control of her Diabetes. Furthermore I want to inform her that her hgb has been elevated chronically and this has to be further evaluated. Smoking may be contributing to this.

## 2010-09-14 ENCOUNTER — Telehealth: Payer: Self-pay | Admitting: *Deleted

## 2010-09-14 NOTE — Telephone Encounter (Signed)
Pt was called and made awared of lab (Lipid) result and new medication (Niacin); to watch her diet per Dr. Loistine Chance.

## 2010-09-14 NOTE — Telephone Encounter (Signed)
Message copied by Chinita Pester on Wed Sep 14, 2010  9:47 AM ------      Message from: Almyra Deforest      Created: Tue Sep 13, 2010  4:44 PM      Regarding: Lab result and starting a new medication       Please Inform that she will be started on a new medication to control her lipids which is Niacin. The prescription was send to the pharmacy. Please review my telephone note.            Thank you.

## 2010-09-18 LAB — GLUCOSE, CAPILLARY: Glucose-Capillary: 130 mg/dL — ABNORMAL HIGH (ref 70–99)

## 2010-09-23 ENCOUNTER — Other Ambulatory Visit: Payer: Self-pay | Admitting: *Deleted

## 2010-09-23 DIAGNOSIS — F341 Dysthymic disorder: Secondary | ICD-10-CM

## 2010-09-26 ENCOUNTER — Other Ambulatory Visit: Payer: Self-pay | Admitting: *Deleted

## 2010-09-26 DIAGNOSIS — F341 Dysthymic disorder: Secondary | ICD-10-CM

## 2010-09-26 MED ORDER — DULOXETINE HCL 60 MG PO CPEP: 60.0000 mg | ORAL_CAPSULE | Freq: Every day | ORAL | Status: DC

## 2010-09-27 ENCOUNTER — Other Ambulatory Visit: Payer: Self-pay | Admitting: *Deleted

## 2010-09-27 NOTE — Telephone Encounter (Signed)
[facility] has been refilled

## 2010-09-28 MED ORDER — FLUTICASONE-SALMETEROL 500-50 MCG/DOSE IN AEPB: 1.0000 | INHALATION_SPRAY | Freq: Two times a day (BID) | RESPIRATORY_TRACT | Status: DC

## 2010-09-30 LAB — GLUCOSE, CAPILLARY: Glucose-Capillary: 314 mg/dL — ABNORMAL HIGH (ref 70–99)

## 2010-10-05 ENCOUNTER — Other Ambulatory Visit: Payer: Self-pay | Admitting: Internal Medicine

## 2010-10-05 NOTE — Telephone Encounter (Signed)
Rx called in 

## 2010-10-05 NOTE — Telephone Encounter (Signed)
Review of record shows that 30 tablets lasted 22 days.  This is within the original prescription directions.  Will refill another 30 tablets for her anxiety.

## 2010-10-10 LAB — GLUCOSE, CAPILLARY: Glucose-Capillary: 158 mg/dL — ABNORMAL HIGH (ref 70–99)

## 2010-10-18 ENCOUNTER — Other Ambulatory Visit: Payer: Self-pay | Admitting: Internal Medicine

## 2010-10-18 DIAGNOSIS — Z1231 Encounter for screening mammogram for malignant neoplasm of breast: Secondary | ICD-10-CM

## 2010-10-21 ENCOUNTER — Ambulatory Visit (HOSPITAL_COMMUNITY): Payer: Self-pay

## 2010-10-25 ENCOUNTER — Other Ambulatory Visit: Payer: Self-pay | Admitting: Internal Medicine

## 2010-10-25 DIAGNOSIS — F419 Anxiety disorder, unspecified: Secondary | ICD-10-CM

## 2010-10-26 ENCOUNTER — Ambulatory Visit (HOSPITAL_COMMUNITY)
Admission: RE | Admit: 2010-10-26 | Discharge: 2010-10-26 | Disposition: A | Discharge status: Home / Self Care | Payer: Self-pay | Source: Ambulatory Visit | Attending: Internal Medicine | Admitting: Internal Medicine

## 2010-10-26 DIAGNOSIS — Z1231 Encounter for screening mammogram for malignant neoplasm of breast: Secondary | ICD-10-CM | POA: Insufficient documentation

## 2010-10-27 NOTE — Telephone Encounter (Signed)
Using more than 1 per day. Has appt Dr Loistine Chance in one week. She must keep that appt as Dr Loistine Chance had rec referral to pysch.

## 2010-10-28 NOTE — Telephone Encounter (Signed)
Pt informed by phone about appointment.

## 2010-10-31 ENCOUNTER — Ambulatory Visit (INDEPENDENT_AMBULATORY_CARE_PROVIDER_SITE_OTHER): Payer: Self-pay | Admitting: Dietician

## 2010-10-31 ENCOUNTER — Ambulatory Visit (INDEPENDENT_AMBULATORY_CARE_PROVIDER_SITE_OTHER): Payer: Self-pay | Admitting: Internal Medicine

## 2010-10-31 ENCOUNTER — Encounter: Payer: Self-pay | Admitting: Internal Medicine

## 2010-10-31 DIAGNOSIS — E119 Type 2 diabetes mellitus without complications: Secondary | ICD-10-CM

## 2010-10-31 DIAGNOSIS — F411 Generalized anxiety disorder: Secondary | ICD-10-CM

## 2010-10-31 DIAGNOSIS — F419 Anxiety disorder, unspecified: Secondary | ICD-10-CM

## 2010-10-31 DIAGNOSIS — D751 Secondary polycythemia: Secondary | ICD-10-CM

## 2010-10-31 DIAGNOSIS — F341 Dysthymic disorder: Secondary | ICD-10-CM

## 2010-10-31 LAB — GLUCOSE, CAPILLARY: Glucose-Capillary: 391 mg/dL — ABNORMAL HIGH (ref 70–99)

## 2010-10-31 MED ORDER — INSULIN GLARGINE 100 UNIT/ML ~~LOC~~ SOLN: 40.0000 [IU] | Freq: Every day | SUBCUTANEOUS | Status: DC

## 2010-10-31 NOTE — Progress Notes (Signed)
  Subjective:    Patient ID: Vanessa [name], female    DOB: 1961/04/08, 50 y.o.   MRN: [id number]  HPI 1. DM: Increased Lantus at the last office visit to 35 units. BG level am 120-200. Today 350. Taking Glipizide.  2. Anxiety: Lorazepam was d/c at the last office visit. Patient was started on Xanax. Patient noted that she felt a little better but she needs it on a regular basis. She was still not able to attend public places without another person. She was not able to go shopping by herself. 3. Depression: pat noted that her crying spell has been reduced compared to the last office visits. She continues to take Cymbalta 4. Mammogram done    Review of Systems  Constitutional: Positive for fatigue. Negative for fever.  Respiratory: Positive for cough, shortness of breath and wheezing.   Cardiovascular: Negative for chest pain, palpitations and leg swelling.  Gastrointestinal: Negative for diarrhea, constipation and abdominal distention.  Genitourinary: Negative for dysuria and dyspareunia.  Neurological: Negative for dizziness and headaches.  Psychiatric/Behavioral: Positive for sleep disturbance. Negative for suicidal ideas. The patient is nervous/anxious.        Objective:   Physical Exam  Constitutional: She is oriented to person, place, and time. She appears well-developed and well-nourished.  HENT:  Head: Normocephalic and atraumatic.  Neck: Neck supple.  Cardiovascular:       Tachycardic regular rate. No murmurs/rubs/gallops  Pulmonary/Chest: Effort normal. No respiratory distress. She has wheezes. She has no rales.  Abdominal: Soft. Bowel sounds are normal.  Musculoskeletal: Normal range of motion.  Neurological: She is alert and oriented to person, place, and time.  Skin: Skin is dry.          Assessment & Plan:

## 2010-10-31 NOTE — Assessment & Plan Note (Addendum)
Patient is continuing to  have panic attacks especially when going to public places. Just the idea to leave the house and meet with other people causes her to sweat and she becomes very jittery. Lorazepam was d/c during the last visit in 08/2010 and she was started on Xanax which she should whenever she feels she has a attack. Considering the continues need of Xanax on a regular basis to control her symptoms I will refer her to SW and Psychiatry for further evaluation and management. I think this is her main problem at this point. If this is better controlled we may be able to do better with her other medical problem. She further notices that she continues to drink  caffeine and smoke on a regular basis.  I again advised her to cut down on the caffeine intake and underlined that is contributing to the anxiety.

## 2010-10-31 NOTE — Assessment & Plan Note (Addendum)
Even with increase of Lantus to 35 units QHS Blood sugars with 180-350s not well controlled. Hgb A1c is 8. 2 (08/2010) which has improved since 02/2010 (Hgb A1c of 9.6). I will increase Lantus to 40 units QHS  and continue on Glipizide. Patient again was  informed that she needs to check her blood sugars on a regular in the  morning before breakfast. She should call the clinic if she notices that is having low blood sugars. Patient has seen Jamison Neighbor for diabetic education and advised her in changes in diet and exercise. I discussed a combination with medication and life style changes will be make a difference in good BG control.

## 2010-10-31 NOTE — Assessment & Plan Note (Addendum)
Patient noted that her episodes of crying spells has improved compared to the last visit.  of mood but never of suicidal ideation. I again would like to start on a SSRI but patient noted that she can not live without Cymbalta. There has been a trial with Prozac and Amitriptyline but it was d/c due to increased depression. I will refer to psychiatry for further management.

## 2010-10-31 NOTE — Progress Notes (Signed)
Medical Nutrition Therapy:  Appt start time: 1400 end time:  1500.  Assessment:  Primary concerns today: Weight management, Blood sugar control and Meal planning  Usual eating pattern includes Meal 2 and 3+ snacks per day.      Avoided foods include: cookies and candy, soda and fried foods and eggs.     Usual physical activity includes very little- patient reports 8+ hours of screen time, cymbalta helping but still reports depression. Family supportive. She is not interested in counseling  Diagnosis and Intervention:  Progress Towards Goal(s):  In progress   Nutritional Diagnosis:  NB-1.1 Food and nutrition-related knowledge deficit As related to not knowing about fat and diabetes relationship.  As evidenced by her report..   Interventions:  1- Education and counseling on lower fat food choices 2- Education regaring the effect of excess fat intake on triglycerides and blood sugar control. 3- Goal setting- encouraged patient to make specific doable goals and follow up often for reinforcement of learning and support for behavior changes.  4- Coordination of care- patient expresses that current medication not adequate in treating her depression.    Monitoring/Evaluation:  Dietary intake in 3 week(s)

## 2010-10-31 NOTE — Assessment & Plan Note (Addendum)
Elevated Hgb and WBC which has been chronic. Elevated Hgb can be contributed by smoking, COPD or sleep apnea. Will continue to monitor on a regular basis. Consider to check CBC at the next office visit.

## 2010-10-31 NOTE — Patient Instructions (Addendum)
To eat healthy and less fat to lower triglycerides and weight I will:   Use lower fat microwave popcorn  Eat more veggies than starches- try to to have at least 5 bags/heads/bunches every 2 weeks  Eat at least 1 fruit serving each day- try to have 2-3 bags/bunches every 2 weeks- can use dried                  or frozen grapes to extend use  Use tub margarine instead of stick, limit peanut butter to 2 TBSP each day, limit oil, margarine,                   mayonnaise also           Weight daily and if not trending down then consider increasing fruit and veggie portions to                         encourage smaller portions of starchy foods and fatty foods.   Make follow up appointment in 3 weeks at front desk  to follow up with weight and how you are doing with the above.

## 2010-11-14 ENCOUNTER — Other Ambulatory Visit: Payer: Self-pay | Admitting: Internal Medicine

## 2010-11-14 DIAGNOSIS — F329 Major depressive disorder, single episode, unspecified: Secondary | ICD-10-CM

## 2010-11-15 ENCOUNTER — Telehealth: Payer: Self-pay | Admitting: Licensed Clinical Social Worker

## 2010-11-15 NOTE — Telephone Encounter (Signed)
Rx called in 

## 2010-11-15 NOTE — Telephone Encounter (Signed)
Pt calling to set up appmt with Soc. Work regarding depression/ psychiatry at her next MD appmt in June 7th, 10:45.  I will see pt after her appmt with DR. Illath on June 7th.

## 2010-11-15 NOTE — Telephone Encounter (Signed)
done

## 2010-11-25 ENCOUNTER — Other Ambulatory Visit: Payer: Self-pay | Admitting: *Deleted

## 2010-11-25 MED ORDER — INSULIN GLARGINE 100 UNIT/ML ~~LOC~~ SOLN: 30.0000 [IU] | Freq: Every day | SUBCUTANEOUS | Status: DC

## 2010-12-01 ENCOUNTER — Ambulatory Visit: Payer: Self-pay | Admitting: Licensed Clinical Social Worker

## 2010-12-01 ENCOUNTER — Telehealth: Payer: Self-pay | Admitting: Licensed Clinical Social Worker

## 2010-12-01 ENCOUNTER — Encounter: Payer: Self-pay | Admitting: Internal Medicine

## 2010-12-01 ENCOUNTER — Ambulatory Visit (INDEPENDENT_AMBULATORY_CARE_PROVIDER_SITE_OTHER): Payer: Self-pay | Admitting: Internal Medicine

## 2010-12-01 VITALS — BP 114/70 | HR 106 | Temp 97.7°F | Ht 62.0 in | Wt 189.2 lb

## 2010-12-01 DIAGNOSIS — J449 Chronic obstructive pulmonary disease, unspecified: Secondary | ICD-10-CM | POA: Insufficient documentation

## 2010-12-01 DIAGNOSIS — D751 Secondary polycythemia: Secondary | ICD-10-CM

## 2010-12-01 DIAGNOSIS — F17200 Nicotine dependence, unspecified, uncomplicated: Secondary | ICD-10-CM

## 2010-12-01 DIAGNOSIS — J441 Chronic obstructive pulmonary disease with (acute) exacerbation: Secondary | ICD-10-CM | POA: Insufficient documentation

## 2010-12-01 DIAGNOSIS — E119 Type 2 diabetes mellitus without complications: Secondary | ICD-10-CM

## 2010-12-01 DIAGNOSIS — E781 Pure hyperglyceridemia: Secondary | ICD-10-CM

## 2010-12-01 DIAGNOSIS — F341 Dysthymic disorder: Secondary | ICD-10-CM

## 2010-12-01 DIAGNOSIS — E785 Hyperlipidemia, unspecified: Secondary | ICD-10-CM

## 2010-12-01 DIAGNOSIS — F329 Major depressive disorder, single episode, unspecified: Secondary | ICD-10-CM

## 2010-12-01 LAB — CBC
Hemoglobin: 16.3 g/dL — ABNORMAL HIGH (ref 12.0–15.0)
MCH: 31.1 pg (ref 26.0–34.0)
MCHC: 34.6 g/dL (ref 30.0–36.0)
RDW: 13.3 % (ref 11.5–15.5)

## 2010-12-01 MED ORDER — PRAVASTATIN SODIUM 40 MG PO TABS: 40.0000 mg | ORAL_TABLET | Freq: Every day | ORAL | Status: DC

## 2010-12-01 MED ORDER — TIOTROPIUM BROMIDE MONOHYDRATE 18 MCG IN CAPS: 18.0000 ug | ORAL_CAPSULE | Freq: Every day | RESPIRATORY_TRACT | Status: DC

## 2010-12-01 NOTE — Patient Instructions (Signed)
Please check your blood sugars twice a day for one week and call the clinic with the results.

## 2010-12-01 NOTE — Telephone Encounter (Signed)
Met with patient today for assessment and planning.

## 2010-12-01 NOTE — Progress Notes (Signed)
  Subjective:    Patient ID: Vanessa [name], female    DOB: Aug 29, 1960, 50 y.o.   MRN: [id number]  HPI This is a 50 year old female with positive significant or hyperlipidemia,  Hypertriglyceridemia,  diabetes and anxiety/depression who presented to the clinic for regular followup visit. #1 diabetes the patient is currently taking Lantus 40 units in the evening and glipizide. Patient has seen Dorthula Matas to assist with better on diet and exercise. Patient noted that she has been trying to reduce her calorie intake but finds it most of some very difficult. Patient was not checking her blood sugars on a regular basis. Patient had mentioned in the past that she sometimes had feedings of low sugars. #2 COPD: patient is currently on Advair and albuterol. She noted that she has been using albuterol at least twice a day to 2 shortness of breath. She continues to smoke but is trying to cut down. She smoking at this point half a pack a day. #3 anxiety and depression: Patient's anxiety disorder that is not well-controlled. She still has troubles to go onto by himself. She's currently on Xanax and Cymbalta. She has a appointment with social worker and assisting with referral to psychiatry and counseling.   Review of Systems  Constitutional: Negative for fever, chills, activity change and appetite change.  Eyes: Negative for visual disturbance.  Respiratory: Positive for cough, shortness of breath and wheezing.   Cardiovascular: Negative for chest pain.  Gastrointestinal: Negative for constipation, blood in stool and abdominal distention.  Genitourinary: Negative for difficulty urinating.  Musculoskeletal: Positive for arthralgias.  Neurological: Negative for dizziness, weakness and numbness.       Objective:   Physical Exam  Constitutional: She is oriented to person, place, and time. She appears well-developed.  HENT:  Head: Normocephalic and atraumatic.  Neck: Neck supple.  Cardiovascular: Normal  rate, regular rhythm and normal heart sounds.   Pulmonary/Chest: Effort normal. No respiratory distress. She has wheezes. She has no rales. She exhibits no tenderness.  Abdominal: Soft. Bowel sounds are normal.  Musculoskeletal: Normal range of motion.  Neurological: She is alert and oriented to person, place, and time.  Skin: Skin is warm and dry.          Assessment & Plan:

## 2010-12-01 NOTE — Assessment & Plan Note (Signed)
Patient's triglycerides are in the 3000. Patient currently taking niacin and fenofibrate. Pravastatin was started today. I am aware of the increased risk of myopathy with the combination of niacin and Tricor. I will evaluate patient on a regular basis for any muscle pain and will check LFT at the next office visit.

## 2010-12-01 NOTE — Assessment & Plan Note (Signed)
Patient continues to smoke but noted that she has decreased it now to half a pack a day. I counseled the patient about the risk of smoking. She is aware of it. She will try to cut down more. May consider nicotine patch patient is interested at the next office visit.

## 2010-12-01 NOTE — Telephone Encounter (Signed)
Called Mental Health Access line with patient.  Appmt scheduled for this Monday June 11th at 9:00 AM.  [facility]   [address]  [phone].  Genelle Bal will do assessment.

## 2010-12-01 NOTE — Assessment & Plan Note (Signed)
Patient continues to have anxiety problems. She is currently on Cymbalta and Xanax. Patient will see social worker today to assist with counseling and referral to psychiatry. Patient definitely needs medication and counseling to help with her anxiety and depression disorder.

## 2010-12-01 NOTE — Assessment & Plan Note (Addendum)
Patient noted that she has been taking Lantus 40 units most of the days. She forgot to take it last night therefore her blood sugars in the office was 300 although patient did not eat anything this morning. And is also currently taking glipizide. Her hemoglobin A1c today is 10.4 which has been worsening. She noted that she has some episodes of low sugars especially early morning but never checked her blood sugars. This patient most likely needs #1 increase of Lantus units and #2 starting meal coverage insulin. I do things at this point that glipizide is given the patient any benefit. Therefore we'll discontinue it. I asked the patient to monitor blood sugars twice a day for this week and call the clinic for the results. According to this all make changes in management. I discussed with her again about the importance to quit her diet and to exercise. I sent the patient for an eye exam.

## 2010-12-01 NOTE — Assessment & Plan Note (Addendum)
Patient is currently on Advair and albuterol when necessary. She has been using the albuterol on a daily basis. She continues to smoke. At this point her symptoms are not improved with the current regimen. Therefore postop area from them continue Advair as well as albuterol when necessary. I informed the patient at your issues arise during here for it should not be used on a daily basis. There is no documented pulmonary function tests in our records. I refer the patient to a pulmonologist.

## 2010-12-01 NOTE — Assessment & Plan Note (Signed)
Patient's triglycerides are in the 3000. Patient currently taking niacin and fenofibrate. Pravastatin was started today. I am aware of the increased risk of myopathy with the combination of niacin and Tricor. I will evaluate patient on a regular basis for any muscle pain and will check LFT at the next office visit.  

## 2010-12-02 ENCOUNTER — Telehealth: Payer: Self-pay | Admitting: *Deleted

## 2010-12-02 LAB — MICROALBUMIN / CREATININE URINE RATIO: Microalb, Ur: 31.03 mg/dL — ABNORMAL HIGH (ref 0.00–1.89)

## 2010-12-02 NOTE — Progress Notes (Signed)
Soc. Work.  30 min.  Referred by MD for SW consult related to depression.   Patient reported that she has been suffering with depression since most of her early life.   She has gone downhill since her mother in law died 5 to 6 years ago and her son was hit head on in a crash.  She was extremely close to her mother in law. 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  She reports she is very irritable and angry and suffering with panic attacks/doesn't like to be out in public.  She also is struggling with her diabetes.  She also relates hx of not knowing her biological father and being told that her stepfather was not her real father and that this was traumatic for her.   She speaks also about the kids no longer living with her and the loss she feels with her adult kids leaving the nest.    Social supports:  Husband and daughter who lives across the street; watches Down Syndrome grandchild.   Hx mental health:  Her son was involved with mental health and she did not have a good experience.  That was over 10 years ago.   I encouraged her to get into active treatment for her depression and anxiety and explained that both medication and counseling would be very beneficial.   She is receptive to my assisting her today with MH appmt.   Assisted her with call to access line.    Appmt secured at [facility] [address]   [phone]  Genelle Bal intake for Monday at 9:00 AM.    SW f/u.

## 2010-12-02 NOTE — Telephone Encounter (Signed)
Pt ;has been getting lantus pens.   Please change in EPIC to pens from vials.  VO was given for change.

## 2010-12-08 ENCOUNTER — Other Ambulatory Visit: Payer: Self-pay | Admitting: Internal Medicine

## 2010-12-08 DIAGNOSIS — E119 Type 2 diabetes mellitus without complications: Secondary | ICD-10-CM

## 2010-12-09 ENCOUNTER — Other Ambulatory Visit: Payer: Self-pay | Admitting: Internal Medicine

## 2010-12-09 MED ORDER — INSULIN GLARGINE 100 UNIT/ML ~~LOC~~ SOLN: 40.0000 [IU] | Freq: Every day | SUBCUTANEOUS | Status: DC

## 2010-12-16 ENCOUNTER — Other Ambulatory Visit: Payer: Self-pay | Admitting: *Deleted

## 2010-12-16 MED ORDER — LISINOPRIL 2.5 MG PO TABS: 2.5000 mg | ORAL_TABLET | Freq: Every day | ORAL | Status: DC

## 2010-12-20 ENCOUNTER — Other Ambulatory Visit: Payer: Self-pay | Admitting: *Deleted

## 2010-12-20 DIAGNOSIS — E781 Pure hyperglyceridemia: Secondary | ICD-10-CM

## 2010-12-20 MED ORDER — NIACIN ER (ANTIHYPERLIPIDEMIC) 500 MG PO TBCR: 500.0000 mg | EXTENDED_RELEASE_TABLET | Freq: Every day | ORAL | Status: DC

## 2010-12-20 NOTE — Telephone Encounter (Signed)
Niaspan rx refilled - request form faxed to [facility].

## 2011-01-02 ENCOUNTER — Encounter: Payer: Self-pay | Admitting: Internal Medicine

## 2011-01-02 ENCOUNTER — Ambulatory Visit (INDEPENDENT_AMBULATORY_CARE_PROVIDER_SITE_OTHER): Payer: Self-pay | Admitting: Internal Medicine

## 2011-01-02 DIAGNOSIS — E781 Pure hyperglyceridemia: Secondary | ICD-10-CM

## 2011-01-02 DIAGNOSIS — E119 Type 2 diabetes mellitus without complications: Secondary | ICD-10-CM

## 2011-01-02 DIAGNOSIS — J45909 Unspecified asthma, uncomplicated: Secondary | ICD-10-CM

## 2011-01-02 DIAGNOSIS — F341 Dysthymic disorder: Secondary | ICD-10-CM

## 2011-01-02 DIAGNOSIS — Z23 Encounter for immunization: Secondary | ICD-10-CM

## 2011-01-02 DIAGNOSIS — J449 Chronic obstructive pulmonary disease, unspecified: Secondary | ICD-10-CM

## 2011-01-02 DIAGNOSIS — Z299 Encounter for prophylactic measures, unspecified: Secondary | ICD-10-CM

## 2011-01-02 DIAGNOSIS — R062 Wheezing: Secondary | ICD-10-CM

## 2011-01-02 LAB — LIPID PANEL: HDL: 30 mg/dL — ABNORMAL LOW (ref >39)

## 2011-01-02 LAB — HEPATIC FUNCTION PANEL
ALT: 14 U/L (ref 0–35)
Alkaline Phosphatase: 111 U/L (ref 39–117)
Indirect Bilirubin: 0.3 mg/dL (ref 0.0–0.9)
Total Protein: 7 g/dL (ref 6.0–8.3)

## 2011-01-02 MED ORDER — INSULIN GLARGINE 100 UNIT/ML ~~LOC~~ SOLN: 45.0000 [IU] | Freq: Every day | SUBCUTANEOUS | Status: DC

## 2011-01-02 MED ORDER — BENZONATATE 100 MG PO CAPS: 100.0000 mg | ORAL_CAPSULE | Freq: Three times a day (TID) | ORAL | Status: AC | PRN

## 2011-01-02 MED ORDER — ALBUTEROL 90 MCG/ACT IN AERS: 1.0000 | INHALATION_SPRAY | RESPIRATORY_TRACT | Status: DC | PRN

## 2011-01-03 ENCOUNTER — Encounter: Payer: Self-pay | Admitting: Internal Medicine

## 2011-01-03 NOTE — Assessment & Plan Note (Signed)
Patient had persistent elevated Triglyceride. Patient was started on Pravastatin at the last office visit and tolerating it well. Will check LFT and FLP today.  FLP is showing significant improvement of Triglyceride from 3944 ( 3 month ago) to 570 on Tricor and Niacin and Pravastatin. Will continue current regimen

## 2011-01-03 NOTE — Progress Notes (Signed)
  Subjective:    Patient ID: Vanessa [name], female    DOB: 03/07/1961, 50 y.o.   MRN: [id number]  HPI This is a 50 year old female with positive significant or hyperlipidemia, Hypertriglyceridemia, diabetes and anxiety/depression who presented to the clinic for regular followup visit.  #1 diabetes the patient is currently taking Lantus 45 units in the evening and glipizide was d/c at the last office visit. Patient has seen Dorthula Matas to assist with better diet and exercise. Patient noted that she has been trying to reduce her calorie intake but finds it most of the time very difficult especially since a lot of things are going on in her life like Surgery of his father today.  Patient was not checking her blood sugars on a regular basis. #2 COPD: patient is currently on Advair and albuterol. She was not able to get Spiriva as prescribed the last time. She gave the prescription to [facility] where it takes 7-10 days to get the medication.  She noted that she has been using albuterol  Not that often as noted at the last office visit but she noted that is having a persistent non-productive cough since 1 week. She tried different thinks over the counter without any relieve.  She continues to smoke but is trying to cut down. She smoking at this point half a pack a day.  #3 anxiety and depression: Patient is seeing a consoler once a week (Thursdays) which is helping her tremendously. She noted that she just loves her.  She's currently on Xanax and Cymbalta and tolerating it well.   Review of Systems  Constitutional: Negative for fever, chills and fatigue.  HENT: Positive for congestion, rhinorrhea and postnasal drip.   Respiratory: Positive for cough, shortness of breath and wheezing.   Cardiovascular: Negative for chest pain and palpitations.  Gastrointestinal: Negative for abdominal pain and abdominal distention.  Genitourinary: Negative for difficulty urinating.  Musculoskeletal: Negative for back  pain and arthralgias.       Objective:   Physical Exam  Constitutional: She is oriented to person, place, and time. She appears well-developed.  HENT:  Head: Normocephalic.  Neck: Neck supple.  Cardiovascular: Normal rate, regular rhythm and normal heart sounds.   Pulmonary/Chest: Effort normal. No respiratory distress. She has wheezes. She has no rales. She exhibits no tenderness.  Abdominal: Soft. Bowel sounds are normal. She exhibits distension. There is no tenderness. There is no rebound.  Musculoskeletal: Normal range of motion.  Neurological: She is alert and oriented to person, place, and time.  Skin: Skin is warm and dry.          Assessment & Plan:

## 2011-01-03 NOTE — Assessment & Plan Note (Signed)
Patient is currently seen a counselor weekly (Thursday). Patient noted that this is very great support and is very helpful  especially dealing with her anxiety. She will continue to follow. At this point I will continue current regimen with Cymbalta and Xanax prn.

## 2011-01-03 NOTE — Assessment & Plan Note (Signed)
Due for colonoscopy: Referred this to next office visit per patient. Due for Pap smear:  Referred this to next office visit per patient. DTap given today.

## 2011-01-03 NOTE — Assessment & Plan Note (Addendum)
Patient continues to take Advair since she was not able to get Spiriva but she should be able to get it within the next day. Patient informed that she should use the Albuterol only as a rescue medication. She does not qualify for an [place] card therefore we will not able to obtain a PFT. Patient was prescribed Tessalon pearls for persistent cough for 1 week most likely a viral URI. Patient was again informed about the importance of quitting smoking. She understands it but noted she has to deal with a lot of thinks at this point and therefore it is difficult to quit. She further noted that it is more lighting up and holding the cigarette then the smoking which helps to deal with the anxiety and frustration. She is able to hold on something. I think the key is to improve her anxiety and depressive problem which would help her overall situation in medical compliance.

## 2011-01-03 NOTE — Assessment & Plan Note (Signed)
Her last Hgb A1c was 10.6 . At the last office visit Glipizide of d/c and Lantus was increased to 45 units at bedtime. She did not bring her meter with her since she was in hurry to be with her father who was undergoing back surgery today. Her CBG today is 191 which is the lowest measured in the clinic. I did not make any changes to medication regimen since it is not clear what her CBG are running on a daily basis. I again informed her that she needs to bring in the glucometer at the next office visit. Concerning her eye exam, she noted that she has seen an ophthalmologist within this year. We have no document ion. Will obtain records.

## 2011-01-05 ENCOUNTER — Other Ambulatory Visit: Payer: Self-pay | Admitting: *Deleted

## 2011-01-05 MED ORDER — FLUTICASONE-SALMETEROL 500-50 MCG/DOSE IN AEPB: 1.0000 | INHALATION_SPRAY | Freq: Two times a day (BID) | RESPIRATORY_TRACT | Status: DC

## 2011-01-05 NOTE — Telephone Encounter (Signed)
Called to pharm 

## 2011-01-12 NOTE — Progress Notes (Signed)
Addended by: Dorie Rank E on: 01/12/2011 03:09 PM   Modules accepted: Orders

## 2011-03-02 ENCOUNTER — Other Ambulatory Visit: Payer: Self-pay | Admitting: *Deleted

## 2011-03-02 DIAGNOSIS — F329 Major depressive disorder, single episode, unspecified: Secondary | ICD-10-CM

## 2011-03-02 MED ORDER — ALPRAZOLAM 0.25 MG PO TABS: 0.2500 mg | ORAL_TABLET | Freq: Three times a day (TID) | ORAL | Status: DC | PRN

## 2011-03-03 NOTE — Telephone Encounter (Signed)
Rx called in 

## 2011-03-10 NOTE — Telephone Encounter (Signed)
[facility] not offer alp[facility]olam so I called into [facility] on [address] for pt.

## 2011-03-15 ENCOUNTER — Other Ambulatory Visit: Payer: Self-pay | Admitting: *Deleted

## 2011-03-16 MED ORDER — FLUTICASONE-SALMETEROL 500-50 MCG/DOSE IN AEPB: 1.0000 | INHALATION_SPRAY | Freq: Two times a day (BID) | RESPIRATORY_TRACT | Status: DC

## 2011-03-16 NOTE — Telephone Encounter (Signed)
Advair rx refill request form faxed ti [facility].

## 2011-03-20 ENCOUNTER — Encounter: Payer: Self-pay | Admitting: Internal Medicine

## 2011-03-20 ENCOUNTER — Ambulatory Visit (INDEPENDENT_AMBULATORY_CARE_PROVIDER_SITE_OTHER): Payer: Self-pay | Admitting: Internal Medicine

## 2011-03-20 VITALS — BP 125/74 | HR 88 | Temp 99.3°F | Ht 63.0 in | Wt 184.5 lb

## 2011-03-20 DIAGNOSIS — J45909 Unspecified asthma, uncomplicated: Secondary | ICD-10-CM

## 2011-03-20 DIAGNOSIS — Z299 Encounter for prophylactic measures, unspecified: Secondary | ICD-10-CM

## 2011-03-20 DIAGNOSIS — E119 Type 2 diabetes mellitus without complications: Secondary | ICD-10-CM

## 2011-03-20 DIAGNOSIS — J449 Chronic obstructive pulmonary disease, unspecified: Secondary | ICD-10-CM

## 2011-03-20 DIAGNOSIS — F341 Dysthymic disorder: Secondary | ICD-10-CM

## 2011-03-20 DIAGNOSIS — E781 Pure hyperglyceridemia: Secondary | ICD-10-CM

## 2011-03-20 DIAGNOSIS — D751 Secondary polycythemia: Secondary | ICD-10-CM

## 2011-03-20 DIAGNOSIS — F329 Major depressive disorder, single episode, unspecified: Secondary | ICD-10-CM

## 2011-03-20 DIAGNOSIS — Z23 Encounter for immunization: Secondary | ICD-10-CM

## 2011-03-20 DIAGNOSIS — M7120 Synovial cyst of popliteal space [Baker], unspecified knee: Secondary | ICD-10-CM

## 2011-03-20 LAB — LIPID PANEL: Cholesterol: 292 mg/dL — ABNORMAL HIGH (ref 0–200)

## 2011-03-20 LAB — POCT GLYCOSYLATED HEMOGLOBIN (HGB A1C): Hemoglobin A1C: 9.2

## 2011-03-20 MED ORDER — ALPRAZOLAM 0.25 MG PO TABS: 0.5000 mg | ORAL_TABLET | Freq: Three times a day (TID) | ORAL | Status: DC | PRN

## 2011-03-20 MED ORDER — INSULIN GLARGINE 100 UNIT/ML ~~LOC~~ SOLN: 50.0000 [IU] | Freq: Every day | SUBCUTANEOUS | Status: DC

## 2011-03-20 MED ORDER — ALPRAZOLAM 0.5 MG PO TABS: 0.5000 mg | ORAL_TABLET | Freq: Three times a day (TID) | ORAL | Status: DC | PRN

## 2011-03-20 NOTE — Assessment & Plan Note (Signed)
Most likely COPD rather then Asthma. She needs to have PFT done but she is out of pocket since she has no insurance.

## 2011-03-20 NOTE — Assessment & Plan Note (Addendum)
Hgb 9.2 today which is down from 10.4 but I noted that her average CBG are running around 240. Will increase Lantus to 50 units and changed to a morning dose since she noted that it is easier to take in the morning since she will remember it. I informed her that she needs to check her sugars every morning before breakfast.   Eye exam performed last year per patient. She is due very soon. Will obtain records.

## 2011-03-20 NOTE — Progress Notes (Deleted)
  Subjective:    Patient ID: Vanessa [name], female    DOB: [birth year], 50 y.o.   MRN: [id number]  HPI    Review of Systems     Objective:   Physical Exam        Assessment & Plan:

## 2011-03-20 NOTE — Patient Instructions (Signed)
Please check your surgars daily before breakfast. Bring in the reading at the next office visit.

## 2011-03-20 NOTE — Assessment & Plan Note (Addendum)
Triglyceride in 7/212 down 570 from 3944. Will recheck lipid panel today. Continue current regimen with Tricor, Niacin and Pravastatin.   Update: Triglyceride now up from 570 to 1758. Will increase Niacin 500 mg BID . Need to recheck FLP and Cmet.

## 2011-03-20 NOTE — Assessment & Plan Note (Signed)
1. Given Flu vaccination and Pneumo-vac. 2. Colonoscopy: would like to discuss at the next office visit.

## 2011-03-20 NOTE — Assessment & Plan Note (Signed)
Will increase Xanax to 0.5 mg po TID. It seems 0.25 mg is not working. I will not increase more then that and would refer further management to psychiatry.

## 2011-03-20 NOTE — Assessment & Plan Note (Signed)
Most likely COPD rather then Asthma. She needs to have PFT done but she is out of pocket since she has no insurance. Her symptoms are well controlled on Spiriva, Advair and prn Albuterol. I will discuss with patient at the next office visit if she would pay for PFT . This may help to get disability.

## 2011-03-20 NOTE — Progress Notes (Signed)
Subjective:   Patient ID: Vanessa [name] female   DOB: 10-05-1960 50 y.o.   MRN: [id number]  HPI: [name] is a 50 y.o. with PMH significant as noted below  who presented to the clinic for a regular visit.  1.Swelling in the back of the left knee. She noted that it has been there for a while but recently it pain full and more swollen.  2. Taking Lantus 45 untis daily. She noted she snacks a lot during the night since she is not able to sleep . Mostly it is peanut butter. 3. Anxiety Depression: it has its up and down . She is seeing in therapist on a weekly basis. She was under a lot of stress since her has father age 2 years old need a lot of assistance with PT and he had  A fall,. He lives with the patient's mother next door and she tries to help as much as possible. Furthermore her daughter was very sick and she had to help her out to get to doctors appointments and PT therefore she had very stressful last month.  4. COPD: she is doing well on Advair and Spiriva. Just uses occassionally Albuterol inhaler.     Past Medical History  Diagnosis Date  . Asthma     No PFT performed  . Diabetes mellitus 2002  . Depression   . Panic attacks     mostly Agaraphobia  . GERD (gastroesophageal reflux disease)   . Helicobacter pylori (H. pylori) infection     s/p triple therapy  . Hyperlipidemia   . Hepatic hemangioma   . Hypertriglyceridemia    Current Outpatient Prescriptions  Medication Sig Dispense Refill  . albuterol (PROVENTIL,VENTOLIN) 90 MCG/ACT inhaler Inhale 1-2 puffs into the lungs every 4 (four) hours as needed for wheezing.  1 each  3  . ALPRAZolam (XANAX) 0.5 MG tablet Take 1 tablet (0.5 mg total) by mouth 3 (three) times daily as needed for sleep.  90 tablet  2  . aspirin 81 MG tablet Take 81 mg by mouth daily.        . DULoxetine (CYMBALTA) 60 MG capsule Take 1 capsule (60 mg total) by mouth daily.  30 capsule  3  . fenofibrate (TRICOR) 145 MG tablet Take 1 tablet (145 mg  total) by mouth daily.  30 tablet  11  . Fluticasone-Salmeterol (ADVAIR DISKUS) 500-50 MCG/DOSE AEPB Inhale 1 puff into the lungs every 12 (twelve) hours.  3 each  0  . insulin glargine (LANTUS SOLOSTAR) 100 UNIT/ML injection Inject 50 Units into the skin daily. In the morning.  15 mL  4  . lisinopril (ZESTRIL) 2.5 MG tablet Take 1 tablet (2.5 mg total) by mouth daily.  31 tablet  4  . niacin (NIASPAN) 500 MG CR tablet Take 1 tablet (500 mg total) by mouth at bedtime.  90 tablet  3  . pravastatin (PRAVACHOL) 40 MG tablet Take 1 tablet (40 mg total) by mouth daily.  30 tablet  3  . tiotropium (SPIRIVA HANDIHALER) 18 MCG inhalation capsule Place 1 capsule (18 mcg total) into inhaler and inhale daily.  30 capsule  2   Review of Systems: Constitutional: Denies fever, chills, appetite change and fatigue.  Respiratory: Denies SOB, DOE, cough, chest tightness,  and wheezing.   Cardiovascular: Denies chest pain and leg swelling but noted occasionally palpation depending on the situation.  Gastrointestinal: Denies nausea, vomiting, abdominal pain, diarrhea, constipation, blood in stool and abdominal distention.  Genitourinary:  Denies dysuria, urgency, frequency, hematuria, flank pain and difficulty urinating.  Neurological: Denies dizziness,  weakness, light-headedness, numbness and headaches.    Objective:  Physical Exam: Filed Vitals:   03/20/11 1358  BP: 125/74  Pulse: 88  Temp: 99.3 F (37.4 C)  TempSrc: Oral  Height: 5\' 3"  (1.6 m)  Weight: 184 lb 8 oz (83.689 kg)   Constitutional: Vital signs reviewed.  Patient is a well-developed and well-nourished  in no acute distress and cooperative with exam. Alert and oriented x3.  Cardiovascular: RRR, S1 normal, S2 normal, no MRG, pulses symmetric and intact bilaterally Pulmonary/Chest: CTAB, no wheezes, rales, or rhonchi Abdominal: Soft. Non-tender, non-distended, bowel sounds are normal, no masses, organomegaly, or guarding present.  GU: no CVA  tenderness Musculoskeletal: Firm nodule noted lateral/posterior of left knee. Nontender, mobile without any erythema. No joint deformities, erythema, or stiffness, ROM full and no nontender Neurological: A&O x3, no focal motor deficit, sensory intact to light touch bilaterally.   Psychiatric: Normal mood and affect. speech and behavior is normal. Judgment and thought content normal.  Assessment & Plan:

## 2011-03-20 NOTE — Assessment & Plan Note (Addendum)
It is not clear what the nodule on the back of the left knee is. It has been there for some time but not it more painfull and increasing in size. Will obtain an ultrasound and manage accordingly.   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 Patient would like to have surgical intervention done. Will follow up with ortho.

## 2011-03-20 NOTE — Assessment & Plan Note (Signed)
Reviewing CBC this Hgb and WBC has been elevated and in the past the platelets has been elevated, too. Too rule out polycythemia vera Epo level and Jak 2 mutation should be assessed. Will discuss with patient at the next visit if she would like to proceed with the diagnostic work up. Secondary polycythemia is smoking and COPD or possible sleep apnea. An ABG would be helpful to access if she retains chronically CO2.

## 2011-03-22 MED ORDER — FENOFIBRATE 160 MG PO TABS: 160.0000 mg | ORAL_TABLET | Freq: Every day | ORAL | Status: DC

## 2011-03-22 NOTE — Progress Notes (Signed)
Addended by: Almyra Deforest on: 03/22/2011 09:35 PM   Modules accepted: Orders

## 2011-03-24 ENCOUNTER — Ambulatory Visit (HOSPITAL_COMMUNITY)
Admission: RE | Admit: 2011-03-24 | Discharge: 2011-03-24 | Disposition: A | Discharge status: Home / Self Care | Payer: Self-pay | Source: Ambulatory Visit | Attending: Internal Medicine | Admitting: Internal Medicine

## 2011-03-24 DIAGNOSIS — M7120 Synovial cyst of popliteal space [Baker], unspecified knee: Secondary | ICD-10-CM | POA: Insufficient documentation

## 2011-03-28 MED ORDER — NIACIN ER (ANTIHYPERLIPIDEMIC) 500 MG PO TBCR: 500.0000 mg | EXTENDED_RELEASE_TABLET | Freq: Two times a day (BID) | ORAL | Status: DC

## 2011-03-28 MED ORDER — FENOFIBRATE 145 MG PO TABS: 145.0000 mg | ORAL_TABLET | Freq: Every day | ORAL | Status: DC

## 2011-03-28 NOTE — Progress Notes (Signed)
Addended by: Almyra Deforest on: 03/28/2011 11:12 AM   Modules accepted: Orders

## 2011-04-14 ENCOUNTER — Other Ambulatory Visit: Payer: Self-pay | Admitting: *Deleted

## 2011-04-14 DIAGNOSIS — J449 Chronic obstructive pulmonary disease, unspecified: Secondary | ICD-10-CM

## 2011-04-14 MED ORDER — TIOTROPIUM BROMIDE MONOHYDRATE 18 MCG IN CAPS: 18.0000 ug | ORAL_CAPSULE | Freq: Every day | RESPIRATORY_TRACT | Status: DC

## 2011-04-14 NOTE — Telephone Encounter (Signed)
Rx called in 

## 2011-04-19 ENCOUNTER — Encounter: Payer: Self-pay | Admitting: Internal Medicine

## 2011-05-29 ENCOUNTER — Encounter: Payer: Self-pay | Admitting: Internal Medicine

## 2011-06-30 ENCOUNTER — Encounter: Payer: Self-pay | Admitting: Internal Medicine

## 2011-06-30 ENCOUNTER — Ambulatory Visit (INDEPENDENT_AMBULATORY_CARE_PROVIDER_SITE_OTHER): Payer: Self-pay | Admitting: Internal Medicine

## 2011-06-30 VITALS — BP 124/79 | HR 113 | Temp 98.0°F | Ht 61.5 in | Wt 182.1 lb

## 2011-06-30 DIAGNOSIS — F329 Major depressive disorder, single episode, unspecified: Secondary | ICD-10-CM

## 2011-06-30 DIAGNOSIS — D751 Secondary polycythemia: Secondary | ICD-10-CM

## 2011-06-30 DIAGNOSIS — F17200 Nicotine dependence, unspecified, uncomplicated: Secondary | ICD-10-CM

## 2011-06-30 DIAGNOSIS — Z299 Encounter for prophylactic measures, unspecified: Secondary | ICD-10-CM

## 2011-06-30 DIAGNOSIS — E781 Pure hyperglyceridemia: Secondary | ICD-10-CM

## 2011-06-30 DIAGNOSIS — E119 Type 2 diabetes mellitus without complications: Secondary | ICD-10-CM

## 2011-06-30 DIAGNOSIS — F341 Dysthymic disorder: Secondary | ICD-10-CM

## 2011-06-30 LAB — CBC
HCT: 48.4 % — ABNORMAL HIGH (ref 36.0–46.0)
MCHC: 33.5 g/dL (ref 30.0–36.0)
Platelets: 459 10*3/uL — ABNORMAL HIGH (ref 150–400)
RDW: 13.9 % (ref 11.5–15.5)
WBC: 12.8 10*3/uL — ABNORMAL HIGH (ref 4.0–10.5)

## 2011-06-30 LAB — LIPID PANEL
Cholesterol: 268 mg/dL — ABNORMAL HIGH (ref 0–200)
Triglycerides: 854 mg/dL — ABNORMAL HIGH (ref <150)

## 2011-06-30 LAB — COMPREHENSIVE METABOLIC PANEL
ALT: 14 U/L (ref 0–35)
AST: 12 U/L (ref 0–37)
Alkaline Phosphatase: 106 U/L (ref 39–117)
BUN: 10 mg/dL (ref 6–23)
Calcium: 10.4 mg/dL (ref 8.4–10.5)
Chloride: 99 mEq/L (ref 96–112)
Potassium: 4.6 mEq/L (ref 3.5–5.3)

## 2011-06-30 LAB — POCT GLYCOSYLATED HEMOGLOBIN (HGB A1C): Hemoglobin A1C: 9.8

## 2011-06-30 LAB — GLUCOSE, CAPILLARY: Glucose-Capillary: 242 mg/dL — ABNORMAL HIGH (ref 70–99)

## 2011-06-30 MED ORDER — ALPRAZOLAM 0.5 MG PO TABS: 0.5000 mg | ORAL_TABLET | Freq: Three times a day (TID) | ORAL | Status: DC | PRN

## 2011-06-30 MED ORDER — INSULIN GLARGINE 100 UNIT/ML ~~LOC~~ SOLN: SUBCUTANEOUS | Status: DC

## 2011-06-30 NOTE — Assessment & Plan Note (Addendum)
Last office visit tragus there were elevated to 1758. Niacin was increased to 500 mg twice a day. She has experienced some hot flashes if she takes them together at the same time. I recommended to split it in the day. I'll recheck lipid panel and see me today for possible changes in management.  Update 07/05/10: Patient triglyceride has decreased to 854. Studies have shown at Omega 3 fish all in combination with Tricor and Niacin decreases the triglyceride even further . I was informed from the blood test apartment that they would be able to provide [employer] with a map program for free. Will take around 4-6 weeks to receive it. I informed the patient about the results.

## 2011-06-30 NOTE — Assessment & Plan Note (Signed)
Hemoglobin A1c continues to be elevated with 9.8. Patient reports she has been taking Lantus in the middle of the day which causes to have her high sugars in the morning. She has episodes of eating during the night. At this point we'll split the Lantus dosage and at the same time we'll increase the total to 55 from 50. She will now take 30 units in the morning and  25 in the evening. Recommended to check her blood sugars every morning before breakfast and every night before bed time and to bring in the meter during the next office visit for possible changes in management. She has been now experiencing some tingling in her feet which is new for her. Am afraid off setting of diabetic neuropathy therefore it is essential to half good control of her blood sugars. She is fine to followup with her ophthalmologist for regular eye exam this month.

## 2011-06-30 NOTE — Patient Instructions (Signed)
Please check your sugars every morning before breakfast and before bedtime for one month. Bring in the meter during the next office visit.

## 2011-06-30 NOTE — Progress Notes (Signed)
Subjective:   Patient ID: Vanessa [name] female   DOB: 02/11/1961 51 y.o.   MRN: [id number]  HPI: [name] is a 51 y.o. with past medical history significant as outlined below who presented to the office for a regular office visit. Patient noted she has been doing fine. She reports that she has been taking Niacin 500 mg 2 tablets at once and had been experiencing flushes. She therefore took aspirin 81 mg twice a day. She reports some achiness in her left ear since one week. Reports that she had some congestion and runny nose which has resolved at this point. Denies any fevers or chills.    Past Medical History  Diagnosis Date  . Asthma     No PFT performed  . Diabetes mellitus 2002  . Depression   . Panic attacks     mostly Agaraphobia  . GERD (gastroesophageal reflux disease)   . Helicobacter pylori (H. pylori) infection     s/p triple therapy  . Hyperlipidemia   . Hepatic hemangioma   . Hypertriglyceridemia    Current Outpatient Prescriptions  Medication Sig Dispense Refill  . albuterol (PROVENTIL,VENTOLIN) 90 MCG/ACT inhaler Inhale 1-2 puffs into the lungs every 4 (four) hours as needed for wheezing.  1 each  3  . ALPRAZolam (XANAX) 0.5 MG tablet Take 1 tablet (0.5 mg total) by mouth 3 (three) times daily as needed for sleep.  90 tablet  2  . aspirin 81 MG tablet Take 81 mg by mouth daily.        . DULoxetine (CYMBALTA) 60 MG capsule Take 1 capsule (60 mg total) by mouth daily.  30 capsule  3  . fenofibrate (TRICOR) 145 MG tablet Take 1 tablet (145 mg total) by mouth daily.  30 tablet  3  . insulin glargine (LANTUS SOLOSTAR) 100 UNIT/ML injection Inject 50 Units into the skin daily. In the morning.  15 mL  4  . lisinopril (ZESTRIL) 2.5 MG tablet Take 1 tablet (2.5 mg total) by mouth daily.  31 tablet  4  . niacin (NIASPAN) 500 MG CR tablet Take 1 tablet (500 mg total) by mouth 2 (two) times daily.  180 tablet  3  . pravastatin (PRAVACHOL) 40 MG tablet Take 1 tablet (40 mg  total) by mouth daily.  30 tablet  3  . tiotropium (SPIRIVA HANDIHALER) 18 MCG inhalation capsule Place 1 capsule (18 mcg total) into inhaler and inhale daily.  90 capsule  11   Review of Systems: Constitutional: Denies fever, chills, diaphoresis, appetite change and fatigue.   Respiratory: Denies SOB, DOE, cough, chest tightness,  and wheezing.   Cardiovascular: Denies chest pain, palpitations and leg swelling.  Gastrointestinal: Denies nausea, vomiting, abdominal pain, diarrhea, constipation, blood in stool and abdominal distention.  Genitourinary: Denies dysuria, urgency, frequency, hematuria, flank pain and difficulty urinating.  Skin: Denies pallor, rash and wound.  Neurological: Denies dizziness, light-headedness, numbness and headaches.    Objective:  Physical Exam: Filed Vitals:   06/30/11 0936  BP: 124/79  Pulse: 113  Temp: 98 F (36.7 C)  TempSrc: Oral  Height: 5' 1.5" (1.562 m)  Weight: 182 lb 1.6 oz (82.6 kg)   Constitutional: Vital signs reviewed.  Patient is a well-developed and well-nourished female in no acute distress and cooperative with exam. Alert and oriented x3.  Ear: TM normal bilaterally Mouth: no erythema or exudates, MMM  Neck: Supple,  Cardiovascular: RRR, S1 normal, S2 normal, no MRG, pulses symmetric and intact bilaterally Pulmonary/Chest:  CTAB, no wheezes, rales, or rhonchi Abdominal: Soft. Non-tender, non-distended, bowel sounds are normal, Neurological: A&O x3,  no focal motor deficit, sensory intact to light touch bilaterally.  Skin: Warm, dry and intact. No rash, cyanosis, or clubbing.

## 2011-06-30 NOTE — Assessment & Plan Note (Signed)
Continues to smoke the patient noted that she has new years resolution which includes decreasing tobacco abuse.

## 2011-06-30 NOTE — Assessment & Plan Note (Signed)
Patient reports that Xanax 0.5 mg does not really significantly change her symptoms compared to 0.25. At this point I don't want to increase any dosage and advised the patient to follow up with her psychiatrist. Patient reports that she does not want to go to the mental health department will stick with the current regimen at this point.

## 2011-06-30 NOTE — Assessment & Plan Note (Signed)
Patient deferred Pap smear  to next office visit

## 2011-07-06 MED ORDER — ALPRAZOLAM 0.5 MG PO TABS: 0.5000 mg | ORAL_TABLET | Freq: Three times a day (TID) | ORAL | Status: DC | PRN

## 2011-07-06 MED ORDER — OMEGA-3-ACID ETHYL ESTERS 1 G PO CAPS: 2.0000 g | ORAL_CAPSULE | Freq: Two times a day (BID) | ORAL | Status: DC

## 2011-07-06 NOTE — Progress Notes (Signed)
Addended by: Almyra Deforest on: 07/06/2011 01:41 PM   Modules accepted: Orders

## 2011-07-11 ENCOUNTER — Other Ambulatory Visit: Payer: Self-pay | Admitting: *Deleted

## 2011-07-11 NOTE — Telephone Encounter (Signed)
Request refill on Advair Disk 500/50 - inhale 1 puff by mouth twice daily; qty 2 disk.

## 2011-07-12 ENCOUNTER — Other Ambulatory Visit: Payer: Self-pay | Admitting: Internal Medicine

## 2011-07-12 MED ORDER — FLUTICASONE-SALMETEROL 500-50 MCG/DOSE IN AEPB: 1.0000 | INHALATION_SPRAY | Freq: Two times a day (BID) | RESPIRATORY_TRACT | Status: DC

## 2011-07-13 NOTE — Telephone Encounter (Signed)
Advair Disk rx faxed to [facility].

## 2011-07-19 ENCOUNTER — Other Ambulatory Visit: Payer: Self-pay | Admitting: *Deleted

## 2011-07-19 DIAGNOSIS — E781 Pure hyperglyceridemia: Secondary | ICD-10-CM

## 2011-07-19 MED ORDER — FENOFIBRATE 145 MG PO TABS: 145.0000 mg | ORAL_TABLET | Freq: Every day | ORAL | Status: DC

## 2011-07-20 NOTE — Telephone Encounter (Signed)
Faxed in

## 2011-08-07 ENCOUNTER — Encounter: Payer: Self-pay | Admitting: Internal Medicine

## 2011-08-14 ENCOUNTER — Other Ambulatory Visit: Payer: Self-pay | Admitting: *Deleted

## 2011-08-15 ENCOUNTER — Encounter: Payer: Self-pay | Admitting: Internal Medicine

## 2011-08-15 ENCOUNTER — Ambulatory Visit (INDEPENDENT_AMBULATORY_CARE_PROVIDER_SITE_OTHER): Payer: Self-pay | Admitting: Internal Medicine

## 2011-08-15 VITALS — BP 133/75 | HR 103 | Temp 97.2°F | Wt 185.2 lb

## 2011-08-15 DIAGNOSIS — E119 Type 2 diabetes mellitus without complications: Secondary | ICD-10-CM

## 2011-08-15 MED ORDER — METFORMIN HCL 500 MG PO TABS: 1000.0000 mg | ORAL_TABLET | Freq: Two times a day (BID) | ORAL | Status: DC

## 2011-08-15 NOTE — Progress Notes (Signed)
Subjective:   Patient ID: Vanessa [name] female   DOB: December 18, 1960 51 y.o.   MRN: [id number]  HPI: [name] is a 51 y.o. with a PMHx of insulin dependent DMII, hypertriglyceridemia, who presented to clinic today for the following:  1) DM, II - A1c 9.8 in 06/2011 - Patient checking blood sugars 3-4 times daily, before breakfast, after lunch, at bedtime. But she is frequently out of test strips, so runs out frequently - has been out of test strips for 1 week - is sharing hers with her husband and is also sharing meters with her husband. Reports fasting blood sugars of 132-200 mg/dL - says these higher blood sugars may be contributed by eating late-night snacks. Currently taking Lantus 55 units. Patient misses doses 0 x per week  Still taking Glipizide 10 mg BID - did not understand that she was supposed to have stopped the Glipizide in 11/2011 - has been out of Glipizide 3 weeks ago. Has been taking her husband's Metformin 500 mg BID for last 3-4 days - has not had issues such as rashes, etc. 0 hypoglycemic episodes since last visit. denies polyuria, polydipsia, nausea, vomiting, diarrhea.  does not request refills today.  Last eye exam 2 years ago - knows she needs to make an appointment.  Review of Systems: Per HPI.  Current Outpatient Medications: Medication Sig  . albuterol (PROVENTIL,VENTOLIN) 90 MCG/ACT inhaler Inhale 1-2 puffs into the lungs every 4 (four) hours as needed for wheezing.  Marland Kitchen ALPRAZolam (XANAX) 0.5 MG tablet Take 1 tablet (0.5 mg total) by mouth 3 (three) times daily as needed for sleep.  Marland Kitchen aspirin 81 MG tablet Take 81 mg by mouth daily.    . DULoxetine (CYMBALTA) 60 MG capsule Take 1 capsule (60 mg total) by mouth daily.  . fenofibrate (TRICOR) 145 MG tablet Take 1 tablet (145 mg total) by mouth daily.  . Fluticasone-Salmeterol (ADVAIR DISKUS) 500-50 MCG/DOSE AEPB Inhale 1 puff into the lungs every 12 (twelve) hours.  . insulin glargine (LANTUS SOLOSTAR) 100 UNIT/ML injection  Inject 30 in the morning and 25 in the evening .  . lisinopril (ZESTRIL) 2.5 MG tablet Take 1 tablet (2.5 mg total) by mouth daily.  . niacin (NIASPAN) 500 MG CR tablet Take 1 tablet (500 mg total) by mouth 2 (two) times daily.  . pravastatin (PRAVACHOL) 40 MG tablet Take 1 tablet (40 mg total) by mouth daily.  Marland Kitchen tiotropium (SPIRIVA HANDIHALER) 18 MCG inhalation capsule Place 1 capsule (18 mcg total) into inhaler and inhale daily.  Marland Kitchen omega-3 acid ethyl esters (LOVAZA) 1 G capsule Take 2 capsules (2 g total) by mouth 2 (two) times daily.    Allergies: Allergies  Allergen Reactions  . Metformin     REACTION: Hives  . Rosiglitazone Maleate     REACTION: Headaches    Past Medical History  Diagnosis Date  . Asthma     No PFT performed  . Diabetes mellitus 2002  . Depression   . Panic attacks     mostly Agaraphobia  . GERD (gastroesophageal reflux disease)   . Helicobacter pylori (H. pylori) infection     s/p triple therapy  . Hyperlipidemia   . Hepatic hemangioma   . Hypertriglyceridemia     Past Surgical History  Procedure Date  . Cesarean section     with 3 children  . Tubal ligation   . Ovarian cyst removal      Objective:   Physical Exam: Filed Vitals:  08/15/11 1021  BP: 133/75  Pulse: 103  Temp: 97.2 F (36.2 C)      General: Vital signs reviewed and noted. Well-developed, well-nourished, in no acute distress; alert, appropriate and cooperative throughout examination.  Head: Normocephalic, atraumatic.  Lungs:  Normal respiratory effort. Clear to auscultation BL without crackles or wheezes.  Heart: RRR. S1 and S2 normal without gallop, rubs. No murmur.  Abdomen:  BS normoactive. Soft, Nondistended, non-tender.  No masses or organomegaly.  Extremities: No pretibial edema.    Assessment & Plan:  Case and plan of care discussed with Dr. Ulyess Mort.

## 2011-08-15 NOTE — Assessment & Plan Note (Addendum)
Labs: Lab Results  Component Value Date   HGBA1C 9.8 06/30/2011   HGBA1C 9.6 03/01/2010   CREATININE 0.61 06/30/2011   CREATININE 0.55 05/02/2010   MICROALBUR 31.03* 12/01/2010   MICRALBCREAT 357.9* 12/01/2010   CHOL 268* 06/30/2011   HDL 30* 06/30/2011   TRIG 854* 06/30/2011    Last eye exam and foot exam: No results found for this basename: HMDIABEYEEXA, HMDIABFOOTEX     Assessment:     Status: There has been a lot of medication confusion - she did not realize that she was supposed to have stop glipizide in 11/2010 - so she continued until 3 week ago.  Also, she has been taking her metformin 500mg  BID from her husband without incident - but without being recommended to do so for any medical provider.  Furthermore, she is has been out of test strips because is checking herself 3-4 times a day, then also checking her husband with her supply (who was recently diagnosed with diabetes) - therefore, she is running out early and cannot afford to get additional test strips.   Lastly, she and her husband are sharing a glucometer at this point.  She does have the relion ([facility]) glucometer.   Disease Control: not controlled  Progress toward goals: unchanged  Barriers to meeting goals: medication confusion, financial constraints, using her diabetic supplies to care for her husband, high stress.   Plan: Glucometer log was not reviewed today, as pt did not have glucometer available for review.      Continue Lantus at current.  START Metformin 500mg  BID x 1 week, then 1000mg  in AM, 500mg  in PM x 1 week, then 1000mg  BID - instructed to be very careful of side effects, and call immediately if she experiences any.  STOP glipizide  Consider to refer to [facility] next visit - pt notably does not have the orange card or insurance (husband makes too much money for qualification)  Patient advised she she is overdue for eye exam - states she will make her own appointment.  A lot of education was  required during this visit as follows:  DO NOT share glucometers - this makes it difficult to assess the patient's blood sugar (as compared with that of her husband)  Decrease frequency of checking blood sugar to 1-2 times a day (before breakfast and at bedtime) currently (and when feeling sick) so as to spread out the test strips available if cost continues to be an issue.  Ask husband's provider for additional diabetic supplies for him - so that they both can be cared for separately.  Work on diet and exercise.  Call the clinic if there are any problems  We must get better control of diabetes to reduce risk of future adverse health consequences.

## 2011-08-15 NOTE — Patient Instructions (Signed)
   Please follow-up at the clinic in 1 month with your PCP, at which time we will reevaluate your diabetes - OR, please follow-up in the clinic sooner if needed.  There have been changes in your medications:  START Metformin - 500 mg tablets. 1 tablet twice a day for 1 week, then 2 tabs in morning and 1 at night for 1 week, then 2 tablets twice a day.  If you have side effects such as diarrhea, decrease the dose of the metformin and call us.  If you have side effects like rash, difficulty breathing, swollen tongue - call us or go to the ER immediately.   Start checking blood sugar at least 1 time a day before breakfast - ideally we want 2 times a day (before breakfast and at bedtime)  If you have been started on new medication(s), and you develop symptoms concerning for allergic reaction, including, but not limited to, throat closing, tongue swelling, rash, please stop the medication immediately and call the clinic at [phone], and go to the ER.  If you are diabetic, please bring your meter to your next visit.  If symptoms worsen, or new symptoms arise, please call the clinic or go to the ER.  Please bring all of your medications in a bag to your next visit.

## 2011-08-22 ENCOUNTER — Telehealth: Payer: Self-pay | Admitting: *Deleted

## 2011-08-22 DIAGNOSIS — J449 Chronic obstructive pulmonary disease, unspecified: Secondary | ICD-10-CM

## 2011-08-22 MED ORDER — ALBUTEROL SULFATE HFA 108 (90 BASE) MCG/ACT IN AERS: 2.0000 | INHALATION_SPRAY | RESPIRATORY_TRACT | Status: DC | PRN

## 2011-08-22 NOTE — Telephone Encounter (Signed)
Refill approved - nurse to complete. 

## 2011-08-22 NOTE — Telephone Encounter (Signed)
Ventolin inh refilled- rx request form faxed to [facility].

## 2011-08-22 NOTE — Telephone Encounter (Signed)
Refill request from [facility] pharmacy - Ventolin HFA  - Inhale 2 puffs every 4 hours as needed for SOB Qty# 1 inh   Last filled - 06/13/11

## 2011-10-03 ENCOUNTER — Other Ambulatory Visit: Payer: Self-pay | Admitting: *Deleted

## 2011-10-03 DIAGNOSIS — F341 Dysthymic disorder: Secondary | ICD-10-CM

## 2011-10-03 DIAGNOSIS — E781 Pure hyperglyceridemia: Secondary | ICD-10-CM

## 2011-10-03 MED ORDER — DULOXETINE HCL 60 MG PO CPEP: 60.0000 mg | ORAL_CAPSULE | Freq: Every day | ORAL | Status: DC

## 2011-10-03 MED ORDER — FENOFIBRATE 145 MG PO TABS: 145.0000 mg | ORAL_TABLET | Freq: Every day | ORAL | Status: DC

## 2011-10-04 NOTE — Telephone Encounter (Signed)
Cymbalta and Tricor refilled - rx request forms faxed to [facility].

## 2011-10-16 ENCOUNTER — Other Ambulatory Visit: Payer: Self-pay | Admitting: Family Medicine

## 2011-10-16 ENCOUNTER — Other Ambulatory Visit: Payer: Self-pay | Admitting: *Deleted

## 2011-10-16 DIAGNOSIS — F419 Anxiety disorder, unspecified: Secondary | ICD-10-CM

## 2011-10-16 DIAGNOSIS — F341 Dysthymic disorder: Secondary | ICD-10-CM

## 2011-10-16 MED ORDER — ALPRAZOLAM 0.5 MG PO TABS: 0.5000 mg | ORAL_TABLET | Freq: Three times a day (TID) | ORAL | Status: DC | PRN

## 2011-10-16 NOTE — Telephone Encounter (Signed)
Note in Epic renewal sent to Dr Madolyn Frieze [facility].

## 2011-10-18 NOTE — Telephone Encounter (Signed)
Talked with pharmacy and pt has picked up Rx.

## 2011-10-19 ENCOUNTER — Other Ambulatory Visit: Payer: Self-pay | Admitting: *Deleted

## 2011-10-19 DIAGNOSIS — E781 Pure hyperglyceridemia: Secondary | ICD-10-CM

## 2011-10-23 MED ORDER — OMEGA-3-ACID ETHYL ESTERS 1 G PO CAPS: 2.0000 g | ORAL_CAPSULE | Freq: Two times a day (BID) | ORAL | Status: DC

## 2011-10-24 NOTE — Telephone Encounter (Signed)
Rx called to pharmacy

## 2011-11-21 ENCOUNTER — Other Ambulatory Visit: Payer: Self-pay | Admitting: *Deleted

## 2011-11-22 MED ORDER — LISINOPRIL 2.5 MG PO TABS: 2.5000 mg | ORAL_TABLET | Freq: Every day | ORAL | Status: DC

## 2011-11-23 ENCOUNTER — Other Ambulatory Visit: Payer: Self-pay | Admitting: *Deleted

## 2011-11-23 DIAGNOSIS — E119 Type 2 diabetes mellitus without complications: Secondary | ICD-10-CM

## 2011-11-29 MED ORDER — METFORMIN HCL 500 MG PO TABS: 500.0000 mg | ORAL_TABLET | Freq: Two times a day (BID) | ORAL | Status: DC

## 2011-11-30 ENCOUNTER — Other Ambulatory Visit: Payer: Self-pay | Admitting: *Deleted

## 2011-11-30 DIAGNOSIS — J449 Chronic obstructive pulmonary disease, unspecified: Secondary | ICD-10-CM

## 2011-11-30 MED ORDER — FLUTICASONE-SALMETEROL 500-50 MCG/DOSE IN AEPB: 1.0000 | INHALATION_SPRAY | Freq: Two times a day (BID) | RESPIRATORY_TRACT | Status: DC

## 2011-12-01 NOTE — Telephone Encounter (Signed)
Rx called in to pharmacy. 

## 2011-12-04 ENCOUNTER — Encounter: Payer: Self-pay | Admitting: Internal Medicine

## 2011-12-13 ENCOUNTER — Other Ambulatory Visit: Payer: Self-pay | Admitting: *Deleted

## 2011-12-13 NOTE — Telephone Encounter (Signed)
Per pharmacy: pt has used # 12 inhalers since 01/13/11 She is using advair, spiriva and ventolin - should she use all 3?? do you want ventolin refilled since she is overusing?

## 2011-12-14 MED ORDER — ALBUTEROL SULFATE HFA 108 (90 BASE) MCG/ACT IN AERS: 2.0000 | INHALATION_SPRAY | RESPIRATORY_TRACT | Status: DC | PRN

## 2011-12-14 NOTE — Telephone Encounter (Signed)
REFILL FAXED IN

## 2011-12-14 NOTE — Telephone Encounter (Signed)
I will refill it once and will evaluate her during next office visit.

## 2012-01-01 ENCOUNTER — Ambulatory Visit (INDEPENDENT_AMBULATORY_CARE_PROVIDER_SITE_OTHER): Payer: Self-pay | Admitting: Internal Medicine

## 2012-01-01 ENCOUNTER — Encounter: Payer: Self-pay | Admitting: Internal Medicine

## 2012-01-01 VITALS — BP 117/72 | HR 60 | Temp 97.5°F | Ht 62.0 in | Wt 181.9 lb

## 2012-01-01 DIAGNOSIS — E781 Pure hyperglyceridemia: Secondary | ICD-10-CM

## 2012-01-01 DIAGNOSIS — J449 Chronic obstructive pulmonary disease, unspecified: Secondary | ICD-10-CM

## 2012-01-01 DIAGNOSIS — F341 Dysthymic disorder: Secondary | ICD-10-CM

## 2012-01-01 DIAGNOSIS — F329 Major depressive disorder, single episode, unspecified: Secondary | ICD-10-CM

## 2012-01-01 DIAGNOSIS — Z299 Encounter for prophylactic measures, unspecified: Secondary | ICD-10-CM

## 2012-01-01 DIAGNOSIS — Z79899 Other long term (current) drug therapy: Secondary | ICD-10-CM

## 2012-01-01 DIAGNOSIS — E119 Type 2 diabetes mellitus without complications: Secondary | ICD-10-CM

## 2012-01-01 DIAGNOSIS — N898 Other specified noninflammatory disorders of vagina: Secondary | ICD-10-CM | POA: Insufficient documentation

## 2012-01-01 DIAGNOSIS — E785 Hyperlipidemia, unspecified: Secondary | ICD-10-CM

## 2012-01-01 DIAGNOSIS — F17200 Nicotine dependence, unspecified, uncomplicated: Secondary | ICD-10-CM

## 2012-01-01 LAB — GLUCOSE, CAPILLARY: Glucose-Capillary: 230 mg/dL — ABNORMAL HIGH (ref 70–99)

## 2012-01-01 LAB — POCT GLYCOSYLATED HEMOGLOBIN (HGB A1C): Hemoglobin A1C: 10

## 2012-01-01 MED ORDER — ASPIRIN 81 MG PO TABS: 81.0000 mg | ORAL_TABLET | Freq: Every day | ORAL | Status: DC

## 2012-01-01 MED ORDER — METFORMIN HCL 1000 MG PO TABS: 1000.0000 mg | ORAL_TABLET | Freq: Two times a day (BID) | ORAL | Status: DC

## 2012-01-01 MED ORDER — LISINOPRIL 2.5 MG PO TABS: 2.5000 mg | ORAL_TABLET | Freq: Every day | ORAL | Status: DC

## 2012-01-01 MED ORDER — FLUTICASONE-SALMETEROL 500-50 MCG/DOSE IN AEPB: 1.0000 | INHALATION_SPRAY | Freq: Two times a day (BID) | RESPIRATORY_TRACT | Status: DC

## 2012-01-01 MED ORDER — ALPRAZOLAM 0.5 MG PO TABS: 0.5000 mg | ORAL_TABLET | Freq: Three times a day (TID) | ORAL | Status: DC | PRN

## 2012-01-01 MED ORDER — TIOTROPIUM BROMIDE MONOHYDRATE 18 MCG IN CAPS: 18.0000 ug | ORAL_CAPSULE | Freq: Every day | RESPIRATORY_TRACT | Status: DC

## 2012-01-01 MED ORDER — PRAVASTATIN SODIUM 40 MG PO TABS
40.0000 mg | ORAL_TABLET | Freq: Every day | ORAL | Status: DC
Start: 2012-01-01 — End: 2012-11-11

## 2012-01-01 MED ORDER — NIACIN ER (ANTIHYPERLIPIDEMIC) 500 MG PO TBCR: 500.0000 mg | EXTENDED_RELEASE_TABLET | Freq: Two times a day (BID) | ORAL | Status: DC

## 2012-01-01 MED ORDER — ALBUTEROL SULFATE HFA 108 (90 BASE) MCG/ACT IN AERS: 2.0000 | INHALATION_SPRAY | RESPIRATORY_TRACT | Status: DC | PRN

## 2012-01-01 MED ORDER — DULOXETINE HCL 60 MG PO CPEP: 60.0000 mg | ORAL_CAPSULE | Freq: Every day | ORAL | Status: DC

## 2012-01-01 NOTE — Progress Notes (Signed)
Subjective:   Patient ID: Vanessa [name] female   DOB: 1960/07/19 51 y.o.   MRN: [id number]  HPI: [name] is a 51 y.o. female with past bases significant as outlined below who presented to the clinic for regular office visit.  1. Vaginal discharge : patient noted that she went to the beach last weekend and started to have itching . She noted that whenever she goes to the beach or goes to the pool she would have vaginal discharge and itching. She tried something over the counter but that did not improve her symptoms.   2.  Anxiety : Breakdown on Saturday . Father has been in the hospital since January. Husband has a lot of medical problem and she is taking care of all him.    3.Shortness of breath ; She noted it is sometimes better controlled but sometimes she has to use  Albuterol  on daily basis depedend on the weather and stress   4. She complains about her chronic knee pain.   Past Medical History  Diagnosis Date  . Asthma     No PFT performed  . Diabetes mellitus 2002  . Depression   . Panic attacks     mostly Agaraphobia  . GERD (gastroesophageal reflux disease)   . Helicobacter pylori (H. pylori) infection     s/p triple therapy  . Hyperlipidemia   . Hepatic hemangioma   . Hypertriglyceridemia    Current Outpatient Prescriptions  Medication Sig Dispense Refill  . albuterol (PROVENTIL HFA;VENTOLIN HFA) 108 (90 BASE) MCG/ACT inhaler Inhale 2 puffs into the lungs every 4 (four) hours as needed for wheezing.  1 Inhaler  0  . ALPRAZolam (XANAX) 0.5 MG tablet Take 1 tablet (0.5 mg total) by mouth 3 (three) times daily as needed for sleep.  90 tablet  1  . aspirin 81 MG tablet Take 81 mg by mouth daily.        . DULoxetine (CYMBALTA) 60 MG capsule Take 1 capsule (60 mg total) by mouth daily.  30 capsule  3  . fenofibrate (TRICOR) 145 MG tablet Take 1 tablet (145 mg total) by mouth daily.  90 tablet  2  . Fluticasone-Salmeterol (ADVAIR DISKUS) 500-50 MCG/DOSE AEPB Inhale 1  puff into the lungs every 12 (twelve) hours.  2 each  3  . insulin glargine (LANTUS SOLOSTAR) 100 UNIT/ML injection Inject 30 in the morning and 25 in the evening .  15 mL  4  . lisinopril (ZESTRIL) 2.5 MG tablet Take 1 tablet (2.5 mg total) by mouth daily.  30 tablet  3  . metFORMIN (GLUCOPHAGE) 1000 MG tablet Take 1 tablet (1,000 mg total) by mouth 2 (two) times daily with a meal.  60 tablet  2  . niacin (NIASPAN) 500 MG CR tablet Take 1 tablet (500 mg total) by mouth 2 (two) times daily.  180 tablet  3  . omega-3 acid ethyl esters (LOVAZA) 1 G capsule Take 2 capsules (2 g total) by mouth 2 (two) times daily.  120 capsule  3  . pravastatin (PRAVACHOL) 40 MG tablet Take 1 tablet (40 mg total) by mouth daily.  30 tablet  3  . tiotropium (SPIRIVA HANDIHALER) 18 MCG inhalation capsule Place 1 capsule (18 mcg total) into inhaler and inhale daily.  90 capsule  11  . DISCONTD: lisinopril (ZESTRIL) 2.5 MG tablet Take 1 tablet (2.5 mg total) by mouth daily.  31 tablet  3  . DISCONTD: metFORMIN (GLUCOPHAGE) 500 MG tablet Take  1 tablet (500 mg total) by mouth 2 (two) times daily with a meal.  60 tablet  1  . DISCONTD: pravastatin (PRAVACHOL) 40 MG tablet Take 1 tablet (40 mg total) by mouth daily.  30 tablet  3  . albuterol (PROVENTIL,VENTOLIN) 90 MCG/ACT inhaler Inhale 1-2 puffs into the lungs every 4 (four) hours as needed for wheezing.  1 each  3   Family History  Problem Relation Age of Onset  . Cancer Mother     ovarian cancer  . Colon polyps Mother     benign  . Cancer Maternal Aunt     breast cancer, ovarian cancer  . Diabetes Maternal Uncle    History   Social History  . Marital Status: Married    Spouse Name: N/A    Number of Children: N/A  . Years of Education: N/A   Social History Main Topics  . Smoking status: Current Everyday Smoker -- 0.5 packs/day for 16 years    Types: Cigarettes  . Smokeless tobacco: None  . Alcohol Use: No     Seldom  . Drug Use: No  . Sexually Active:  None   Other Topics Concern  . None   Social History Narrative   Drinks at least a pot of coffee every day.    Review of Systems: Constitutional: Denies fever, chills, diaphoresis, appetite change and fatigue.    Respiratory: Noted occasionally SOB, DOE, cough,  and wheezing.   Cardiovascular: Denies chest pain, palpitations and leg swelling.  Gastrointestinal: Denies nausea, vomiting, abdominal pain, diarrhea, constipation, blood in stool and abdominal distention.  Genitourinary: Denies dysuria, urgency, frequency, hematuria, flank pain and difficulty urinating.   Neurological: Denies dizziness,headaches.   Psychiatric/Behavioral: Denies suicidal ideation. But noted anxiety and  sleep disturbance   Objective:  Physical Exam: Filed Vitals:   01/01/12 1345  BP: 117/72  Pulse: 60  Temp: 97.5 F (36.4 C)  TempSrc: Oral  Height: 5\' 2"  (1.575 m)  Weight: 181 lb 14.4 oz (82.509 kg)   Constitutional: Vital signs reviewed.  Patient is a well-developed and well-nourished woman in no acute distress and cooperative with exam. Alert and oriented x3.  Neck: Supple,  Cardiovascular: RRR, S1 normal, S2 normal, no MRG, pulses symmetric and intact bilaterally Pulmonary/Chest: Good air movement. Mild  Wheezes throughout, rales, or rhonchi Abdominal: Soft. Non-tender, non-distended, bowel sounds are normal Musculoskeletal: No joint deformities, erythema, or stiffness, ROM full and mild  Tenderness present.  Neurological: A&O x3, Strength is normal and symmetric bilaterally,sensory intact to light touch bilaterally.  Psychiatric: Normal mood and affect. speech and behavior is normal.

## 2012-01-01 NOTE — Patient Instructions (Signed)
1. Diabetes; will increase metformin 1000 mg twice a day. Will increase Lantus 30 units twice a day. Please check your blood sugars at least once a day. 2. please try to take time for yourself and call us if you need anything.

## 2012-01-02 LAB — LIPID PANEL
Cholesterol: 190 mg/dL (ref 0–200)
HDL: 24 mg/dL — ABNORMAL LOW (ref >39)
Total CHOL/HDL Ratio: 7.9 Ratio
Triglycerides: 383 mg/dL — ABNORMAL HIGH (ref <150)

## 2012-01-03 NOTE — Progress Notes (Signed)
RXs (6) faxed to [facility] Monday afternoon per Dr Loistine Chance.

## 2012-01-04 ENCOUNTER — Other Ambulatory Visit: Payer: Self-pay | Admitting: Internal Medicine

## 2012-01-04 MED ORDER — FLUCONAZOLE 150 MG PO TABS: 150.0000 mg | ORAL_TABLET | Freq: Once | ORAL | Status: AC

## 2012-01-04 NOTE — Assessment & Plan Note (Addendum)
I will obtain wet prep today and await result for treatment.  Update: Patient was found to have Vaginal candidiasis. I prescribed one dose of fluconazole. I informed the patient.

## 2012-01-06 NOTE — Assessment & Plan Note (Signed)
Will obtain Lipid panel for possible changes in management.  Update: LDL at goal with 89. Will continue current regimen.

## 2012-01-06 NOTE — Assessment & Plan Note (Signed)
Discussed about smoking cessation . Patient noted that she is trying hard but since there is so much going on in her life right now that she is not able to cut down as much as she wants. She further noted it is just a habit to have something in her hand.

## 2012-01-06 NOTE — Assessment & Plan Note (Signed)
Will obtain Pap smear today.

## 2012-01-06 NOTE — Assessment & Plan Note (Signed)
We will continue current regimen. Again advised about smoking cessation. No acute signs of infection or exacerbation.   New prescription send to [facility] department.

## 2012-01-06 NOTE — Assessment & Plan Note (Signed)
Uncontrolled with Hgb A1c of 10. I will increase Metformin from 500 mg to 1000 mg and will increase Lantus from 25 units in the evening to 30 units. I will continue Lantus 30 units in the morning. She has not checked her CBG on a regular basis and did not bring in her meter. She noted it was to difficult since she had to take care of her husband and her father. I informed her that she at least need to check it 2 times a day for the next one month to get a better idea what her CBGs are for possible changes in mangement.   She was evaluated by Ophthalmology on 01/08/12. I have not yet received the note.

## 2012-01-06 NOTE — Assessment & Plan Note (Signed)
Will check Lipid panel for possible changes.   Update: Triglyceride down to 383. Will continue current regimen.

## 2012-01-06 NOTE — Assessment & Plan Note (Signed)
I again advised her to follow up with her consoler on a regular basis. The key for her is to talk to someone on a regular basis. I am reluctant to change any of her medication. She does not want to see any psychiatrist. I will readdress this during the next office visit with me.

## 2012-01-24 ENCOUNTER — Other Ambulatory Visit: Payer: Self-pay | Admitting: *Deleted

## 2012-01-24 DIAGNOSIS — E781 Pure hyperglyceridemia: Secondary | ICD-10-CM

## 2012-01-26 ENCOUNTER — Other Ambulatory Visit: Payer: Self-pay | Admitting: Internal Medicine

## 2012-01-26 DIAGNOSIS — E781 Pure hyperglyceridemia: Secondary | ICD-10-CM

## 2012-01-26 MED ORDER — OMEGA-3-ACID ETHYL ESTERS 1 G PO CAPS: 2.0000 g | ORAL_CAPSULE | Freq: Two times a day (BID) | ORAL | Status: DC

## 2012-02-19 ENCOUNTER — Encounter: Payer: Self-pay | Admitting: Internal Medicine

## 2012-03-14 ENCOUNTER — Other Ambulatory Visit: Payer: Self-pay | Admitting: *Deleted

## 2012-03-14 DIAGNOSIS — E781 Pure hyperglyceridemia: Secondary | ICD-10-CM

## 2012-03-14 MED ORDER — NIACIN ER (ANTIHYPERLIPIDEMIC) 500 MG PO TBCR: 500.0000 mg | EXTENDED_RELEASE_TABLET | Freq: Two times a day (BID) | ORAL | Status: DC

## 2012-03-15 NOTE — Telephone Encounter (Signed)
Rx called in to pharmacy. 

## 2012-03-22 ENCOUNTER — Telehealth: Payer: Self-pay | Admitting: *Deleted

## 2012-03-22 NOTE — Telephone Encounter (Signed)
[facility] calls to ask when niacin changed to 2 tabs, the last script was for 1 tab, i cannot find the documentation, please advise

## 2012-03-28 ENCOUNTER — Other Ambulatory Visit: Payer: Self-pay | Admitting: *Deleted

## 2012-03-28 DIAGNOSIS — F341 Dysthymic disorder: Secondary | ICD-10-CM

## 2012-03-28 DIAGNOSIS — F419 Anxiety disorder, unspecified: Secondary | ICD-10-CM

## 2012-04-01 MED ORDER — ALPRAZOLAM 0.5 MG PO TABS: 0.5000 mg | ORAL_TABLET | Freq: Three times a day (TID) | ORAL | Status: DC | PRN

## 2012-04-01 NOTE — Telephone Encounter (Signed)
Please call in Xanax for Ms Clingan  Thanks Almyra Deforest, MD

## 2012-04-02 NOTE — Telephone Encounter (Signed)
Called to pharm 

## 2012-04-15 ENCOUNTER — Ambulatory Visit (HOSPITAL_COMMUNITY)
Admission: RE | Admit: 2012-04-15 | Discharge: 2012-04-15 | Disposition: A | Discharge status: Home / Self Care | Payer: Self-pay | Source: Ambulatory Visit | Attending: Internal Medicine | Admitting: Internal Medicine

## 2012-04-15 ENCOUNTER — Encounter: Payer: Self-pay | Admitting: Internal Medicine

## 2012-04-15 ENCOUNTER — Ambulatory Visit (INDEPENDENT_AMBULATORY_CARE_PROVIDER_SITE_OTHER): Payer: Self-pay | Admitting: Internal Medicine

## 2012-04-15 VITALS — BP 113/73 | HR 96 | Temp 97.3°F | Ht 62.0 in | Wt 175.0 lb

## 2012-04-15 DIAGNOSIS — Z23 Encounter for immunization: Secondary | ICD-10-CM | POA: Insufficient documentation

## 2012-04-15 DIAGNOSIS — K7689 Other specified diseases of liver: Secondary | ICD-10-CM | POA: Insufficient documentation

## 2012-04-15 DIAGNOSIS — R110 Nausea: Secondary | ICD-10-CM | POA: Insufficient documentation

## 2012-04-15 DIAGNOSIS — F17200 Nicotine dependence, unspecified, uncomplicated: Secondary | ICD-10-CM

## 2012-04-15 DIAGNOSIS — J449 Chronic obstructive pulmonary disease, unspecified: Secondary | ICD-10-CM

## 2012-04-15 DIAGNOSIS — F341 Dysthymic disorder: Secondary | ICD-10-CM

## 2012-04-15 DIAGNOSIS — Z79899 Other long term (current) drug therapy: Secondary | ICD-10-CM

## 2012-04-15 DIAGNOSIS — R109 Unspecified abdominal pain: Secondary | ICD-10-CM

## 2012-04-15 DIAGNOSIS — Z1231 Encounter for screening mammogram for malignant neoplasm of breast: Secondary | ICD-10-CM

## 2012-04-15 DIAGNOSIS — Z1211 Encounter for screening for malignant neoplasm of colon: Secondary | ICD-10-CM | POA: Insufficient documentation

## 2012-04-15 DIAGNOSIS — D751 Secondary polycythemia: Secondary | ICD-10-CM

## 2012-04-15 DIAGNOSIS — F329 Major depressive disorder, single episode, unspecified: Secondary | ICD-10-CM

## 2012-04-15 DIAGNOSIS — E781 Pure hyperglyceridemia: Secondary | ICD-10-CM

## 2012-04-15 DIAGNOSIS — E785 Hyperlipidemia, unspecified: Secondary | ICD-10-CM

## 2012-04-15 DIAGNOSIS — E119 Type 2 diabetes mellitus without complications: Secondary | ICD-10-CM

## 2012-04-15 LAB — CBC WITH DIFFERENTIAL/PLATELET
Basophils Absolute: 0.1 10*3/uL (ref 0.0–0.1)
Basophils Relative: 1 % (ref 0–1)
Eosinophils Absolute: 0.4 10*3/uL (ref 0.0–0.7)
Eosinophils Relative: 3 % (ref 0–5)
HCT: 44.9 % (ref 36.0–46.0)
Hemoglobin: 16.2 g/dL — ABNORMAL HIGH (ref 12.0–15.0)
Lymphocytes Relative: 28 % (ref 12–46)
Lymphs Abs: 4 10*3/uL (ref 0.7–4.0)
MCH: 30.6 pg (ref 26.0–34.0)
MCHC: 36.1 g/dL — ABNORMAL HIGH (ref 30.0–36.0)
MCV: 84.9 fL (ref 78.0–100.0)
Monocytes Absolute: 0.6 10*3/uL (ref 0.1–1.0)
Monocytes Relative: 4 % (ref 3–12)
Neutro Abs: 9 10*3/uL — ABNORMAL HIGH (ref 1.7–7.7)
Neutrophils Relative %: 64 % (ref 43–77)
Platelets: 408 10*3/uL — ABNORMAL HIGH (ref 150–400)
RBC: 5.29 MIL/uL — ABNORMAL HIGH (ref 3.87–5.11)
RDW: 13.7 % (ref 11.5–15.5)
WBC: 14.2 10*3/uL — ABNORMAL HIGH (ref 4.0–10.5)

## 2012-04-15 LAB — POCT GLYCOSYLATED HEMOGLOBIN (HGB A1C): Hemoglobin A1C: 9.1

## 2012-04-15 LAB — GLUCOSE, CAPILLARY: Glucose-Capillary: 167 mg/dL — ABNORMAL HIGH (ref 70–99)

## 2012-04-15 MED ORDER — ALPRAZOLAM 0.5 MG PO TABS: 0.5000 mg | ORAL_TABLET | Freq: Three times a day (TID) | ORAL | Status: DC | PRN

## 2012-04-15 MED ORDER — INSULIN GLARGINE 100 UNIT/ML ~~LOC~~ SOLN: SUBCUTANEOUS | Status: DC

## 2012-04-15 MED ORDER — ALBUTEROL SULFATE HFA 108 (90 BASE) MCG/ACT IN AERS: 2.0000 | INHALATION_SPRAY | RESPIRATORY_TRACT | Status: DC | PRN

## 2012-04-15 MED ORDER — NIACIN ER (ANTIHYPERLIPIDEMIC) 500 MG PO TBCR: 500.0000 mg | EXTENDED_RELEASE_TABLET | Freq: Two times a day (BID) | ORAL | Status: DC

## 2012-04-15 NOTE — Patient Instructions (Signed)
1.Nause: Will get ultrasound and blood test 2. Mammogram  3. Increase Lantus to 35 units in the morning and 25 units in the evening

## 2012-04-15 NOTE — Progress Notes (Signed)
Subjective:   Patient ID: Vanessa [name] female   DOB: 05-20-1961 51 y.o.   MRN: [id number]  HPI: Vanessa [name] is a 51 y.o. female with past medical history significant as outlined below who presented to the clinic for abdominal pain and nausea. Patient reports this has been going on for one month. As soon as she something she gets very no she did. After that she feels somewhat really puffy. The abdominal pain is occasionally achy but sometimes sharp in character and is located in the epigastric area. Denies any diarrhea or constipation. She noted that she does have irregular periods this has been going on for very long time it.  Patient reports that her social situation is still not well. She continues to smoke which helps her with anxiety Anxiety:      Past Medical History  Diagnosis Date  . Asthma     No PFT performed  . Diabetes mellitus 2002  . Depression   . Panic attacks     mostly Agaraphobia  . GERD (gastroesophageal reflux disease)   . Helicobacter pylori (H. pylori) infection     s/p triple therapy  . Hyperlipidemia   . Hepatic hemangioma   . Hypertriglyceridemia    Current Outpatient Prescriptions  Medication Sig Dispense Refill  . albuterol (PROVENTIL HFA;VENTOLIN HFA) 108 (90 BASE) MCG/ACT inhaler Inhale 2 puffs into the lungs every 4 (four) hours as needed for wheezing.  1 Inhaler  2  . albuterol (PROVENTIL,VENTOLIN) 90 MCG/ACT inhaler Inhale 1-2 puffs into the lungs every 4 (four) hours as needed for wheezing.  1 each  3  . ALPRAZolam (XANAX) 0.5 MG tablet Take 1 tablet (0.5 mg total) by mouth 3 (three) times daily as needed for sleep.  90 tablet  1  . aspirin 81 MG tablet Take 1 tablet (81 mg total) by mouth daily.  30 tablet  2  . DULoxetine (CYMBALTA) 60 MG capsule Take 1 capsule (60 mg total) by mouth daily.  30 capsule  3  . fenofibrate (TRICOR) 145 MG tablet Take 1 tablet (145 mg total) by mouth daily.  90 tablet  2  . Fluticasone-Salmeterol (ADVAIR DISKUS)  500-50 MCG/DOSE AEPB Inhale 1 puff into the lungs every 12 (twelve) hours.  2 each  3  . insulin glargine (LANTUS SOLOSTAR) 100 UNIT/ML injection Inject 30 in the morning and 25 in the evening .  15 mL  4  . lisinopril (ZESTRIL) 2.5 MG tablet Take 1 tablet (2.5 mg total) by mouth daily.  30 tablet  3  . metFORMIN (GLUCOPHAGE) 1000 MG tablet Take 1 tablet (1,000 mg total) by mouth 2 (two) times daily with a meal.  60 tablet  2  . niacin (NIASPAN) 500 MG CR tablet Take 1 tablet (500 mg total) by mouth 2 (two) times daily.  180 tablet  3  . omega-3 acid ethyl esters (LOVAZA) 1 G capsule Take 2 capsules (2 g total) by mouth 2 (two) times daily.  120 capsule  3  . pravastatin (PRAVACHOL) 40 MG tablet Take 1 tablet (40 mg total) by mouth daily.  30 tablet  3  . tiotropium (SPIRIVA HANDIHALER) 18 MCG inhalation capsule Place 1 capsule (18 mcg total) into inhaler and inhale daily.  90 capsule  11   Family History  Problem Relation Age of Onset  . Cancer Mother     ovarian cancer  . Colon polyps Mother     benign  . Cancer Maternal Aunt  breast cancer, ovarian cancer  . Diabetes Maternal Uncle    History   Social History  . Marital Status: Married    Spouse Name: N/A    Number of Children: N/A  . Years of Education: N/A   Social History Main Topics  . Smoking status: Current Every Day Smoker -- 0.5 packs/day for 16 years    Types: Cigarettes  . Smokeless tobacco: None  . Alcohol Use: No     Seldom  . Drug Use: No  . Sexually Active: None   Other Topics Concern  . None   Social History Narrative   Drinks at least a pot of coffee every day.    Review of Systems: Bold if positive  Constitutional:  fever, chills, diaphoresis, appetite change and fatigue.    Respiratory:SOB, DOE, cough, chest tightness,  and wheezing.   Cardiovascular:  chest pain, palpitations and leg swelling.  Gastrointestinal:  nausea, vomiting, abdominal pain, diarrhea, constipation, blood in stool and  abdominal distention.  Genitourinary:  dysuria, urgency, frequency, hematuria Neurological:  dizziness,  Psychiatric/Behavioral: nervousness, sleep disturbance and agitation  Objective:  Physical Exam: Filed Vitals:   04/15/12 1449  BP: 113/73  Pulse: 96  Temp: 97.3 F (36.3 C)  TempSrc: Oral  Height: 5\' 2"  (1.575 m)  Weight: 175 lb (79.379 kg)  SpO2: 90%   Constitutional: Vital signs reviewed.  Patient is a well-developed and well-nourished female in no acute distress and cooperative with exam. Alert and oriented x3. .  Neck: Supple,  Cardiovascular: RRR, S1 normal, S2 normal, no MRG, pulses symmetric and intact bilaterally Pulmonary/Chest: CTAB, no wheezes, rales, or rhonchi Abdominal: Soft. Mild epigastric tenderness on palpation non-distended, bowel sounds are normal, .  Neurological: A&O x3,  Skin: Warm, dry and intact. No rash, cyanosis, or clubbing.  Psychiatric: Normal mood and affect.

## 2012-04-16 LAB — PRESCRIPTION ABUSE MONITORING 15P, URINE
Buprenorphine, Urine: NEGATIVE ng/mL
Cannabinoid Scrn, Ur: NEGATIVE ng/mL
Cocaine Metabolites: NEGATIVE ng/mL
Creatinine, Urine: 23.14 mg/dL (ref >20.0)
Methadone Screen, Urine: NEGATIVE ng/mL
Opiate Screen, Urine: NEGATIVE ng/mL
Tramadol Scrn, Ur: NEGATIVE ng/mL
Zolpidem, Urine: NEGATIVE ng/mL

## 2012-04-16 LAB — COMPREHENSIVE METABOLIC PANEL
ALT: 24 U/L (ref 0–35)
AST: 18 U/L (ref 0–37)
Albumin: 4.7 g/dL (ref 3.5–5.2)
Alkaline Phosphatase: 98 U/L (ref 39–117)
BUN: 8 mg/dL (ref 6–23)
Creat: 0.49 mg/dL — ABNORMAL LOW (ref 0.50–1.10)
Potassium: 4.5 mEq/L (ref 3.5–5.3)

## 2012-04-16 NOTE — Progress Notes (Signed)
Albuterol inhaler and Niacin rxs called to [facility] per Dr Loistine Chance.

## 2012-04-17 ENCOUNTER — Telehealth: Payer: Self-pay | Admitting: *Deleted

## 2012-04-17 LAB — BENZODIAZEPINES (GC/LC/MS), URINE
Alprazolam (GC/LC/MS), ur confirm: 92 ng/mL
Alprazolam metabolite (GC/LC/MS), ur confirm: 74 ng/mL
Clonazepam metabolite (GC/LC/MS), ur confirm: NEGATIVE ng/mL
Diazepam (GC/LC/MS), ur confirm: NEGATIVE ng/mL
Flunitrazepam metabolite (GC/LC/MS), ur confirm: NEGATIVE ng/mL
Flurazepam metabolite (GC/LC/MS), ur confirm: NEGATIVE ng/mL
Lorazepam (GC/LC/MS), ur confirm: NEGATIVE ng/mL
Triazolam metabolite (GC/LC/MS), ur confirm: NEGATIVE ng/mL

## 2012-04-17 NOTE — Telephone Encounter (Signed)
[facility] informed 

## 2012-04-17 NOTE — Telephone Encounter (Signed)
Yes please. That is the only way she tolerates it   Thanks [employer]

## 2012-04-17 NOTE — Telephone Encounter (Signed)
Received a call from [facility] asking for clarification on pt's medication.  They received Rx for niacin 500 mg cr twice daily.  The last Rx they had was for niacin 500 mg once daily.  That was 11/2010. Pt called and she takes it twice a day.  Is that what you want?

## 2012-04-20 ENCOUNTER — Other Ambulatory Visit: Payer: Self-pay | Admitting: Internal Medicine

## 2012-04-20 DIAGNOSIS — E785 Hyperlipidemia, unspecified: Secondary | ICD-10-CM

## 2012-04-22 NOTE — Assessment & Plan Note (Signed)
Hemoglobin A1c today is 9.1 which is improved from 4 months ago when he was 10. Nevertheless I will increase her Lantus dosage from 30 units in the a.m. to 35 units in the morning and continue 25 units in the evening. Patient reports that she sometimes has low CBG in the morning

## 2012-04-22 NOTE — Assessment & Plan Note (Signed)
Differentiated diagnosis is broad including cholecystitis, cholelithiasis, GERD, peptic ulcer disease, gastroparesis. I will first obtain an abdominal ultrasound  Update: Abdominal ultrasound negative for any acute process. Patient is currently on PPI. At this point concerning for gastroparesis. Patient would need a gastric emptying study. At this point recommended frequent small meals

## 2012-04-22 NOTE — Assessment & Plan Note (Signed)
Consulate again about smoking cessation. Patient noted that she has too many stressors

## 2012-04-22 NOTE — Assessment & Plan Note (Signed)
Patient is continuing needing Xanax. I recommended to followup with her counselor. She noted she did not have the time do to family issues at she underlined that she definitely needs it sooner than later.

## 2012-04-22 NOTE — Assessment & Plan Note (Deleted)
I will obtain lipid panel for possible changes in management 

## 2012-04-22 NOTE — Assessment & Plan Note (Signed)
Referred patient for mammogram.

## 2012-05-03 ENCOUNTER — Other Ambulatory Visit: Payer: Self-pay | Admitting: Internal Medicine

## 2012-05-05 ENCOUNTER — Other Ambulatory Visit: Payer: Self-pay | Admitting: Internal Medicine

## 2012-05-08 ENCOUNTER — Other Ambulatory Visit: Payer: Self-pay | Admitting: Internal Medicine

## 2012-05-08 DIAGNOSIS — E781 Pure hyperglyceridemia: Secondary | ICD-10-CM

## 2012-05-08 MED ORDER — NIACIN ER (ANTIHYPERLIPIDEMIC) 500 MG PO TBCR: 500.0000 mg | EXTENDED_RELEASE_TABLET | Freq: Two times a day (BID) | ORAL | Status: DC

## 2012-05-13 ENCOUNTER — Other Ambulatory Visit: Payer: Self-pay | Admitting: *Deleted

## 2012-05-13 DIAGNOSIS — E781 Pure hyperglyceridemia: Secondary | ICD-10-CM

## 2012-05-13 DIAGNOSIS — F341 Dysthymic disorder: Secondary | ICD-10-CM

## 2012-05-13 MED ORDER — OMEGA-3-ACID ETHYL ESTERS 1 G PO CAPS: 2.0000 g | ORAL_CAPSULE | Freq: Two times a day (BID) | ORAL | Status: DC

## 2012-05-13 MED ORDER — DULOXETINE HCL 60 MG PO CPEP: 60.0000 mg | ORAL_CAPSULE | Freq: Every day | ORAL | Status: DC

## 2012-05-13 NOTE — Telephone Encounter (Signed)
Both Rx called in to pharmacy. 

## 2012-05-13 NOTE — Telephone Encounter (Signed)
Refill approved - nurse to phone in. 

## 2012-06-14 ENCOUNTER — Other Ambulatory Visit: Payer: Self-pay | Admitting: *Deleted

## 2012-06-14 DIAGNOSIS — F341 Dysthymic disorder: Secondary | ICD-10-CM

## 2012-06-14 DIAGNOSIS — F419 Anxiety disorder, unspecified: Secondary | ICD-10-CM

## 2012-06-14 MED ORDER — ALPRAZOLAM 0.5 MG PO TABS: 0.5000 mg | ORAL_TABLET | Freq: Three times a day (TID) | ORAL | Status: DC | PRN

## 2012-06-14 NOTE — Telephone Encounter (Signed)
Called in.

## 2012-06-28 ENCOUNTER — Other Ambulatory Visit: Payer: Self-pay | Admitting: Internal Medicine

## 2012-06-28 DIAGNOSIS — E119 Type 2 diabetes mellitus without complications: Secondary | ICD-10-CM

## 2012-07-02 NOTE — Telephone Encounter (Signed)
Called to pharm 

## 2012-07-10 ENCOUNTER — Other Ambulatory Visit: Payer: Self-pay | Admitting: *Deleted

## 2012-07-10 DIAGNOSIS — E781 Pure hyperglyceridemia: Secondary | ICD-10-CM

## 2012-07-12 ENCOUNTER — Other Ambulatory Visit: Payer: Self-pay | Admitting: *Deleted

## 2012-07-12 DIAGNOSIS — E119 Type 2 diabetes mellitus without complications: Secondary | ICD-10-CM

## 2012-07-14 MED ORDER — OMEGA-3-ACID ETHYL ESTERS 1 G PO CAPS: 2.0000 g | ORAL_CAPSULE | Freq: Two times a day (BID) | ORAL | Status: DC

## 2012-07-14 MED ORDER — INSULIN GLARGINE 100 UNIT/ML ~~LOC~~ SOLN: SUBCUTANEOUS | Status: DC

## 2012-07-16 NOTE — Telephone Encounter (Signed)
Rx called in to pharmacy. 

## 2012-07-17 ENCOUNTER — Other Ambulatory Visit: Payer: Self-pay | Admitting: *Deleted

## 2012-07-17 DIAGNOSIS — J449 Chronic obstructive pulmonary disease, unspecified: Secondary | ICD-10-CM

## 2012-07-17 NOTE — Telephone Encounter (Signed)
Called to pharm 

## 2012-07-18 ENCOUNTER — Other Ambulatory Visit: Payer: Self-pay | Admitting: *Deleted

## 2012-07-18 DIAGNOSIS — E781 Pure hyperglyceridemia: Secondary | ICD-10-CM

## 2012-07-18 MED ORDER — ALBUTEROL SULFATE HFA 108 (90 BASE) MCG/ACT IN AERS: 2.0000 | INHALATION_SPRAY | RESPIRATORY_TRACT | Status: DC | PRN

## 2012-07-18 NOTE — Telephone Encounter (Signed)
Called to pharm 

## 2012-07-19 MED ORDER — FENOFIBRATE 145 MG PO TABS: 145.0000 mg | ORAL_TABLET | Freq: Every day | ORAL | Status: DC

## 2012-07-22 NOTE — Telephone Encounter (Signed)
Called to pharm 

## 2012-07-25 ENCOUNTER — Other Ambulatory Visit: Payer: Self-pay | Admitting: *Deleted

## 2012-07-25 DIAGNOSIS — F341 Dysthymic disorder: Secondary | ICD-10-CM

## 2012-07-25 MED ORDER — DULOXETINE HCL 60 MG PO CPEP: 60.0000 mg | ORAL_CAPSULE | Freq: Every day | ORAL | Status: DC

## 2012-07-26 NOTE — Telephone Encounter (Signed)
Cymbalta rx refill  - request form faxed to [facility].

## 2012-07-29 ENCOUNTER — Encounter: Payer: Self-pay | Admitting: Internal Medicine

## 2012-10-28 ENCOUNTER — Encounter: Payer: Self-pay | Admitting: Internal Medicine

## 2012-11-11 ENCOUNTER — Encounter: Payer: Self-pay | Admitting: Internal Medicine

## 2012-11-11 ENCOUNTER — Ambulatory Visit (INDEPENDENT_AMBULATORY_CARE_PROVIDER_SITE_OTHER): Payer: Self-pay | Admitting: Internal Medicine

## 2012-11-11 VITALS — BP 126/74 | HR 102 | Temp 97.9°F | Ht 63.0 in | Wt 176.6 lb

## 2012-11-11 DIAGNOSIS — E1165 Type 2 diabetes mellitus with hyperglycemia: Secondary | ICD-10-CM

## 2012-11-11 DIAGNOSIS — E781 Pure hyperglyceridemia: Secondary | ICD-10-CM

## 2012-11-11 DIAGNOSIS — J449 Chronic obstructive pulmonary disease, unspecified: Secondary | ICD-10-CM

## 2012-11-11 DIAGNOSIS — F341 Dysthymic disorder: Secondary | ICD-10-CM

## 2012-11-11 DIAGNOSIS — Z1231 Encounter for screening mammogram for malignant neoplasm of breast: Secondary | ICD-10-CM

## 2012-11-11 DIAGNOSIS — F17200 Nicotine dependence, unspecified, uncomplicated: Secondary | ICD-10-CM

## 2012-11-11 DIAGNOSIS — R109 Unspecified abdominal pain: Secondary | ICD-10-CM

## 2012-11-11 LAB — POCT GLYCOSYLATED HEMOGLOBIN (HGB A1C): Hemoglobin A1C: 9.9

## 2012-11-11 LAB — BASIC METABOLIC PANEL WITH GFR
CO2: 26 mEq/L (ref 19–32)
Chloride: 101 mEq/L (ref 96–112)
Creat: 0.51 mg/dL (ref 0.50–1.10)
Potassium: 4.4 mEq/L (ref 3.5–5.3)

## 2012-11-11 LAB — HEPATIC FUNCTION PANEL
Albumin: 4 g/dL (ref 3.5–5.2)
Bilirubin, Direct: <0.1 mg/dL (ref 0.0–0.3)
Total Bilirubin: 0.3 mg/dL (ref 0.3–1.2)

## 2012-11-11 LAB — GLUCOSE, CAPILLARY: Glucose-Capillary: 314 mg/dL — ABNORMAL HIGH (ref 70–99)

## 2012-11-11 LAB — LIPASE: Lipase: 23 U/L (ref 0–75)

## 2012-11-11 MED ORDER — METFORMIN HCL 1000 MG PO TABS: ORAL_TABLET | ORAL | Status: DC

## 2012-11-11 MED ORDER — ALPRAZOLAM 0.5 MG PO TABS: 0.5000 mg | ORAL_TABLET | Freq: Three times a day (TID) | ORAL | Status: DC | PRN

## 2012-11-11 MED ORDER — INSULIN GLARGINE 100 UNIT/ML ~~LOC~~ SOLN: SUBCUTANEOUS | Status: DC

## 2012-11-11 MED ORDER — PRAVASTATIN SODIUM 40 MG PO TABS: ORAL_TABLET | ORAL | Status: DC

## 2012-11-11 NOTE — Progress Notes (Signed)
Subjective:   Patient ID: [name] female   DOB: 1960/09/25 52 y.o.   MRN: [id number]  HPI: [name] is a 52 y.o. female with past medical history significant as outlined below who presented to the clinic for regular office visit. Patient noted that she is experiencing a lot of stressors since December. Her father was diagnosed with metastatic prostate cancer and therefore was busy going with him different doctors appointment. She further has to take care of her mother. Patient noted that she has not been taking her medication on a regular basis, has not been eating right and increased smoking cigarettes. Patient further complains about Abdominal pain along with  Diarrhea and then episodes of constipation.  Abdominal pain is an aching character. No aggravating or alleviating factors. She is episodes of nausea and vomiting especially when she is stressed out.It started when father got sick. She is experiencing cycle of abdominal pain, diarrhea, constipation every 2 weeks. It is whenever he has to take her father to her doctor.  Reviewing patient's refill documentation it was noted that she has not been taking any of her medication on a regular basis. I sent a prescription to the pharmacy Past Medical History  Diagnosis Date  . Asthma     No PFT performed  . Diabetes mellitus 2002  . Depression   . Panic attacks     mostly Agaraphobia  . GERD (gastroesophageal reflux disease)   . Helicobacter pylori (H. pylori) infection     s/p triple therapy  . Hyperlipidemia   . Hepatic hemangioma   . Hypertriglyceridemia    Current Outpatient Prescriptions  Medication Sig Dispense Refill  . albuterol (PROVENTIL HFA;VENTOLIN HFA) 108 (90 BASE) MCG/ACT inhaler Inhale 2 puffs into the lungs every 4 (four) hours as needed for wheezing.  3 Inhaler  1  . albuterol (PROVENTIL,VENTOLIN) 90 MCG/ACT inhaler Inhale 1-2 puffs into the lungs every 4 (four) hours as needed for wheezing.  1 each  3  .  ALPRAZolam (XANAX) 0.5 MG tablet Take 1 tablet (0.5 mg total) by mouth 3 (three) times daily as needed for sleep or anxiety.  90 tablet  0  . aspirin 81 MG tablet Take 1 tablet (81 mg total) by mouth daily.  30 tablet  2  . DULoxetine (CYMBALTA) 60 MG capsule Take 1 capsule (60 mg total) by mouth daily.  30 capsule  3  . fenofibrate (TRICOR) 145 MG tablet Take 1 tablet (145 mg total) by mouth daily.  90 tablet  3  . Fluticasone-Salmeterol (ADVAIR DISKUS) 500-50 MCG/DOSE AEPB Inhale 1 puff into the lungs every 12 (twelve) hours.  2 each  3  . insulin glargine (LANTUS SOLOSTAR) 100 UNIT/ML injection Inject 35 in the morning and 25 in the evening .  15 mL  4  . lisinopril (ZESTRIL) 2.5 MG tablet Take 1 tablet (2.5 mg total) by mouth daily.  30 tablet  3  . metFORMIN (GLUCOPHAGE) 1000 MG tablet TAKE ONE TABLET BY MOUTH TWICE DAILY WITH MEALS  60 tablet  2  . niacin (NIASPAN) 500 MG CR tablet Take 1 tablet (500 mg total) by mouth 2 (two) times daily.  60 tablet  2  . omega-3 acid ethyl esters (LOVAZA) 1 G capsule Take 2 capsules (2 g total) by mouth 2 (two) times daily.  360 capsule  2  . pravastatin (PRAVACHOL) 40 MG tablet Take 1 tablet (40 mg total) by mouth daily.  30 tablet  3  . pravastatin (  PRAVACHOL) 40 MG tablet TAKE ONE TABLET BY MOUTH EVERY DAY  30 tablet  2  . tiotropium (SPIRIVA HANDIHALER) 18 MCG inhalation capsule Place 1 capsule (18 mcg total) into inhaler and inhale daily.  90 capsule  11   No current facility-administered medications for this visit.   Family History  Problem Relation Age of Onset  . Cancer Mother     ovarian cancer  . Colon polyps Mother     benign  . Cancer Maternal Aunt     breast cancer, ovarian cancer  . Diabetes Maternal Uncle    History   Social History  . Marital Status: Married    Spouse Name: N/A    Number of Children: N/A  . Years of Education: N/A   Social History Main Topics  . Smoking status: Current Every Day Smoker -- 1.00 packs/day for  16 years    Types: Cigarettes  . Smokeless tobacco: Not on file  . Alcohol Use: No     Comment: Seldom  . Drug Use: No  . Sexually Active: Not on file   Other Topics Concern  . Not on file   Social History Narrative   Drinks at least a pot of coffee every day.    Review of Systems: Constitutional: Denies fever, chills, diaphoresis, appetite change and fatigue.    Respiratory: Denies SOB, DOE, cough, chest tightness,  and wheezing.   Cardiovascular: Denies chest pain, palpitations and leg swelling.  Genitourinary: Denies dysuria, urgency, frequency, hematuria, flank pain and difficulty urinating.  Skin: Denies pallor, rash and wound.  Neurological: Denies dizziness,   Objective:  Physical Exam: Filed Vitals:   11/11/12 1558  BP: 126/74  Pulse: 102  Temp: 97.9 F (36.6 C)  TempSrc: Oral  Height: 5\' 3"  (1.6 m)  Weight: 176 lb 9.6 oz (80.105 kg)  SpO2: 89%   Constitutional: Vital signs reviewed.  Patient is a well-developed and well-nourished female in no acute distress and cooperative with exam. Alert and oriented x3.  Mouth: no erythema or exudates, MMM, poor dentures Eyes: PERRL, EOMI, conjunctivae normal, No scleral icterus.  Neck: Supple,  Cardiovascular: RRR, S1 normal, S2 normal, no MRG, pulses symmetric and intact bilaterally Pulmonary/Chest: CTAB, no wheezes, rales, or rhonchi Abdominal: Soft. Mildly tender in the epigastric area without guarding , mildly distended, bowel sounds are normal, no masses, organomegaly,  GU: no CVA tenderness Hematology: no cervical adenopathy.  Neurological: A&O x3, Strength is normal and symmetric bilaterally, no focal motor deficit, sensory intact to light touch bilaterally.  Skin: Warm, dry and intact. No rash, cyanosis, or clubbing.

## 2012-11-12 ENCOUNTER — Encounter: Payer: Self-pay | Admitting: Internal Medicine

## 2012-11-12 LAB — HEPATITIS PANEL, ACUTE
HCV Ab: NEGATIVE
Hep A IgM: NEGATIVE
Hep B C IgM: NEGATIVE

## 2012-11-12 MED ORDER — OMEPRAZOLE 20 MG PO CPDR: 20.0000 mg | DELAYED_RELEASE_CAPSULE | Freq: Every day | ORAL | Status: DC

## 2012-11-12 MED ORDER — TIOTROPIUM BROMIDE MONOHYDRATE 18 MCG IN CAPS: 18.0000 ug | ORAL_CAPSULE | Freq: Every day | RESPIRATORY_TRACT | Status: DC

## 2012-11-12 MED ORDER — LISINOPRIL 2.5 MG PO TABS: 2.5000 mg | ORAL_TABLET | Freq: Every day | ORAL | Status: DC

## 2012-11-12 MED ORDER — NIACIN ER (ANTIHYPERLIPIDEMIC) 500 MG PO TBCR: 500.0000 mg | EXTENDED_RELEASE_TABLET | Freq: Two times a day (BID) | ORAL | Status: DC

## 2012-11-12 MED ORDER — FENOFIBRATE 145 MG PO TABS: 145.0000 mg | ORAL_TABLET | Freq: Every day | ORAL | Status: DC

## 2012-11-12 NOTE — Patient Instructions (Signed)
Please take all the medication as instructed

## 2012-11-12 NOTE — Assessment & Plan Note (Addendum)
Patient's hemoglobin A1c  has declined from 9.1 to 9.9 the last 7 months likely due to medical noncompliance. Patient has a lot of stressors currently. At this point I will increase Lantus 40 units in the morning and 35 units in the evening.I instructed the patient that she needs to check her blood sugars on a daily basis and bring her meter during the next office visit. That is the only way we can make definite changes. She was instructed to continue metformin thousand grams twice a day   Will need eye exam during the next office .

## 2012-11-12 NOTE — Assessment & Plan Note (Signed)
Patient had abdominal pain since at least October reviewing the chart. Called to sign was obtained which was negative for any acute process. Liver function test during that office visit was within normal limits. LFTs, lipase during this office visit was within normal limits. Differential diagnosis at this point include irritable bowel disease versus gastroparesis. Patient has no insurance to obtain any imaging studies or referral. Patient also never had colonoscopy do to financial restraints.

## 2012-11-12 NOTE — Assessment & Plan Note (Addendum)
Patient continues to smoke. Currently more do to stress. Counseled about the importance to quit smoking

## 2012-11-13 NOTE — Progress Notes (Signed)
Case discussed with Dr. Illath immediately after the resident saw the patient. We reviewed the resident's history and exam and pertinent patient test results. I agree with the assessment, diagnosis and plan of care documented in the resident's note. 

## 2012-11-19 ENCOUNTER — Telehealth: Payer: Self-pay | Admitting: Internal Medicine

## 2012-11-19 NOTE — Telephone Encounter (Signed)
Discussed with  Patient about lab results. Patient is still working to get insurance.

## 2012-11-28 ENCOUNTER — Encounter: Payer: Self-pay | Admitting: Internal Medicine

## 2012-12-09 ENCOUNTER — Encounter: Payer: Self-pay | Admitting: Internal Medicine

## 2012-12-16 ENCOUNTER — Other Ambulatory Visit: Payer: Self-pay | Admitting: Internal Medicine

## 2012-12-16 DIAGNOSIS — J449 Chronic obstructive pulmonary disease, unspecified: Secondary | ICD-10-CM

## 2012-12-16 MED ORDER — ALBUTEROL SULFATE HFA 108 (90 BASE) MCG/ACT IN AERS: 2.0000 | INHALATION_SPRAY | Freq: Four times a day (QID) | RESPIRATORY_TRACT | Status: DC | PRN

## 2012-12-19 ENCOUNTER — Telehealth: Payer: Self-pay | Admitting: Dietician

## 2012-12-19 NOTE — Telephone Encounter (Signed)
Offered retinal camera appointment at same time she sees Dr. Sherrine Maples. Note patient has not met with financial counselor recently and should complete orange card application in case she needs to be referred to Ophthalmologist

## 2013-01-02 ENCOUNTER — Other Ambulatory Visit: Payer: Self-pay | Admitting: *Deleted

## 2013-01-02 ENCOUNTER — Encounter: Payer: Self-pay | Admitting: Internal Medicine

## 2013-01-02 ENCOUNTER — Other Ambulatory Visit: Payer: Self-pay

## 2013-01-02 DIAGNOSIS — J449 Chronic obstructive pulmonary disease, unspecified: Secondary | ICD-10-CM

## 2013-01-06 ENCOUNTER — Other Ambulatory Visit: Payer: Self-pay | Admitting: Internal Medicine

## 2013-01-06 DIAGNOSIS — J449 Chronic obstructive pulmonary disease, unspecified: Secondary | ICD-10-CM

## 2013-01-06 MED ORDER — FLUTICASONE-SALMETEROL 500-50 MCG/DOSE IN AEPB: 1.0000 | INHALATION_SPRAY | Freq: Two times a day (BID) | RESPIRATORY_TRACT | Status: DC

## 2013-01-09 ENCOUNTER — Encounter: Payer: Self-pay | Admitting: Internal Medicine

## 2013-01-09 ENCOUNTER — Ambulatory Visit (INDEPENDENT_AMBULATORY_CARE_PROVIDER_SITE_OTHER): Payer: Self-pay | Admitting: Internal Medicine

## 2013-01-09 VITALS — BP 137/81 | HR 120 | Temp 97.2°F | Ht 62.5 in | Wt 177.8 lb

## 2013-01-09 DIAGNOSIS — F17200 Nicotine dependence, unspecified, uncomplicated: Secondary | ICD-10-CM

## 2013-01-09 DIAGNOSIS — E1165 Type 2 diabetes mellitus with hyperglycemia: Secondary | ICD-10-CM

## 2013-01-09 DIAGNOSIS — Z0000 Encounter for general adult medical examination without abnormal findings: Secondary | ICD-10-CM | POA: Insufficient documentation

## 2013-01-09 DIAGNOSIS — J449 Chronic obstructive pulmonary disease, unspecified: Secondary | ICD-10-CM

## 2013-01-09 DIAGNOSIS — F341 Dysthymic disorder: Secondary | ICD-10-CM

## 2013-01-09 DIAGNOSIS — R109 Unspecified abdominal pain: Secondary | ICD-10-CM

## 2013-01-09 DIAGNOSIS — K589 Irritable bowel syndrome without diarrhea: Secondary | ICD-10-CM | POA: Insufficient documentation

## 2013-01-09 DIAGNOSIS — Z1231 Encounter for screening mammogram for malignant neoplasm of breast: Secondary | ICD-10-CM

## 2013-01-09 DIAGNOSIS — E785 Hyperlipidemia, unspecified: Secondary | ICD-10-CM

## 2013-01-09 DIAGNOSIS — E118 Type 2 diabetes mellitus with unspecified complications: Secondary | ICD-10-CM

## 2013-01-09 MED ORDER — ALBUTEROL SULFATE HFA 108 (90 BASE) MCG/ACT IN AERS: 2.0000 | INHALATION_SPRAY | Freq: Four times a day (QID) | RESPIRATORY_TRACT | Status: DC | PRN

## 2013-01-09 MED ORDER — FLUTICASONE-SALMETEROL 500-50 MCG/DOSE IN AEPB: 1.0000 | INHALATION_SPRAY | Freq: Two times a day (BID) | RESPIRATORY_TRACT | Status: DC

## 2013-01-09 MED ORDER — DULOXETINE HCL 60 MG PO CPEP: 60.0000 mg | ORAL_CAPSULE | Freq: Two times a day (BID) | ORAL | Status: DC

## 2013-01-09 MED ORDER — DULOXETINE HCL 30 MG PO CPEP: 30.0000 mg | ORAL_CAPSULE | Freq: Every day | ORAL | Status: DC

## 2013-01-09 MED ORDER — INSULIN GLARGINE 100 UNIT/ML ~~LOC~~ SOLN: SUBCUTANEOUS | Status: DC

## 2013-01-09 NOTE — Assessment & Plan Note (Signed)
Checking stool cards x3, as pt unable to afford colonoscopy at this time.

## 2013-01-09 NOTE — Assessment & Plan Note (Signed)
Pt requesting an Advair sample. Only have 100/50, pt uses 500/50. Given written rx for Advair and Albuterol inhaler today for the MAP program.

## 2013-01-09 NOTE — Assessment & Plan Note (Addendum)
Placed referral for screening mammogram. Patient states she will have it done.

## 2013-01-09 NOTE — Assessment & Plan Note (Signed)
Patient with significant social stressors. She was seen a counselor but was unable to keep regular followup, and also doesn't have insurance. She's on Xanax 0.5 mg 3 times a day, and Cymbalta 60 mg daily. Will increase the Cymbalta to 60 twice a day and continue the Xanax as prescribed. - Cymbalta 60 mg every morning, and 30 mg at night for one week, then 60 mg at night. - Continue Xanax 0.5 mg 3 times a day - Followup in one month

## 2013-01-09 NOTE — Patient Instructions (Addendum)
**Try the low FODMAP diet, please see the handout that was given to you.   **I am increasing your Cymbalta dose. Please take 60mg  in the morning and 30mg  at night for 1 week, then begin taking 60mg  by mouth twice a day.   **I am sending you home with stool cards which allows [place] to check your stool for blood. After you complete the set of 3 cards, please return them to our office to be evaluated.   Diet and Irritable Bowel Syndrome  No cure has been found for irritable bowel syndrome (IBS). Many options are available to treat the symptoms. Your caregiver will give you the best treatments available for your symptoms. He or she will also encourage you to manage stress and to make changes to your diet. You need to work with your caregiver and Registered Dietician to find the best combination of medicine, diet, counseling, and support to control your symptoms. The following are some diet suggestions. FOODS THAT MAKE IBS WORSE  Fatty foods, such as [place] fries.  Milk products, such as cheese or ice cream.  Chocolate.  Alcohol.  Caffeine (found in coffee and some sodas).  Carbonated drinks, such as soda. If certain foods cause symptoms, you should eat less of them or stop eating them. FOOD JOURNAL   Keep a journal of the foods that seem to cause distress. Write down:  What you are eating during the day and when.  What problems you are having after eating.  When the symptoms occur in relation to your meals.  What foods always make you feel badly.  Take your notes with you to your caregiver to see if you should stop eating certain foods. FOODS THAT MAKE IBS BETTER Fiber reduces IBS symptoms, especially constipation, because it makes stools soft, bulky, and easier to pass. Fiber is found in bran, bread, cereal, beans, fruit, and vegetables. Examples of foods with fiber include:  Apples.  Peaches.  Pears.  Berries.  Figs.  Broccoli, raw.  Cabbage.  Carrots.  Raw  peas.  Kidney beans.  Lima beans.  Whole-grain bread.  Whole-grain cereal. Add foods with fiber to your diet a little at a time. This will let your body get used to them. Too much fiber at once might cause gas and swelling of your abdomen. This can trigger symptoms in a person with IBS. Caregivers usually recommend a diet with enough fiber to produce soft, painless bowel movements. High fiber diets may cause gas and bloating. However, these symptoms often go away within a few weeks, as your body adjusts. In many cases, dietary fiber may lessen IBS symptoms, particularly constipation. However, it may not help pain or diarrhea. High fiber diets keep the colon mildly enlarged (distended) with the added fiber. This may help prevent spasms in the colon. Some forms of fiber also keep water in the stool, thereby preventing hard stools that are difficult to pass.  Besides telling you to eat more foods with fiber, your caregiver may also tell you to get more fiber by taking a fiber pill or drinking water mixed with a special high fiber powder. An example of this is a natural fiber laxative containing psyllium seed.  TIPS  Large meals can cause cramping and diarrhea in people with IBS. If this happens to you, try eating 4 or 5 small meals a day, or try eating less at each of your usual 3 meals. It may also help if your meals are low in fat and high in  carbohydrates. Examples of carbohydrates are pasta, rice, whole-grain breads and cereals, fruits, and vegetables.  If dairy products cause your symptoms to flare up, you can try eating less of those foods. You might be able to handle yogurt better than other dairy products, because it contains bacteria that helps with digestion. Dairy products are an important source of calcium and other nutrients. If you need to avoid dairy products, be sure to talk with a Registered Dietitian about getting these nutrients through other food sources.  Drink enough water and  fluids to keep your urine clear or pale yellow. This is important, especially if you have diarrhea. FOR MORE INFORMATION  International Foundation for Functional Gastrointestinal Disorders: www.iffgd.org  National Digestive Diseases Information Clearinghouse: digestive.StageSync.si Document Released: 09/02/2003 Document Revised: 09/04/2011 Document Reviewed: 05/20/2007 [facility] Patient Information 2014 [place], [place].

## 2013-01-09 NOTE — Assessment & Plan Note (Signed)
Checking lipid panel today. Last labs were 12/2011.

## 2013-01-09 NOTE — Progress Notes (Signed)
Patient ID: Vanessa [name], female   DOB: [birth year], 52 y.o.   MRN: [id number]  Subjective:   Patient ID: Vanessa [name] female   DOB: August 15, 1960 52 y.o.   MRN: [id number]  HPI: [name] is a 52 y.o. F with PMH depression/anxiety, cigarette abuse, DM2, HTN, and persistent abdominal pain presents for a routine follow up and to establish care.   She states that father is dying of stage 4 prostate cancer that has metastasized throughout his body. She is having to help take care of him, her mother who is the fathers primary care giver, and her husband who is s/p recent MI. She c/o of chronic anxiety and stress over these things in her life. Dr. Loistine Chance started her on Xanax and has increased it over the past few months, but the pt states that it is not doing much for her. She thinks the dose is too low.   Because of the social stressors, she continues to smoke one pack per day, states her dietary habits have not been the best.  Regarding her abdominal pain and distention, she states that the pain/distention have been present for past few months, and are not really relieved by anything. She states she's having intermittent episodes of significant loose stools with 12 loose stools in the day, and then will be significantly constipated for the next few days. She started taking probiotics which did initially help, but she states her the past week she has not noticed any improvement in her symptoms.  She has never had a colonoscopy or seen a GI specialist. She cannot afford insurance at this time unfortunately does not qualify for the orange card.   Past Medical History  Diagnosis Date  . Asthma     No PFT performed  . Diabetes mellitus 2002  . Depression   . Panic attacks     mostly Agaraphobia  . GERD (gastroesophageal reflux disease)   . Helicobacter pylori (H. pylori) infection     s/p triple therapy  . Hyperlipidemia   . Hepatic hemangioma   . Hypertriglyceridemia    Current Outpatient  Prescriptions  Medication Sig Dispense Refill  . albuterol (PROVENTIL HFA;VENTOLIN HFA) 108 (90 BASE) MCG/ACT inhaler Inhale 2 puffs into the lungs every 6 (six) hours as needed for wheezing.  1 Inhaler  1  . albuterol (PROVENTIL,VENTOLIN) 90 MCG/ACT inhaler Inhale 1-2 puffs into the lungs every 4 (four) hours as needed for wheezing.  1 each  3  . ALPRAZolam (XANAX) 0.5 MG tablet Take 1 tablet (0.5 mg total) by mouth 3 (three) times daily as needed for sleep or anxiety.  90 tablet  0  . aspirin 81 MG tablet Take 1 tablet (81 mg total) by mouth daily.  30 tablet  2  . DULoxetine (CYMBALTA) 60 MG capsule Take 1 capsule (60 mg total) by mouth daily.  30 capsule  3  . fenofibrate (TRICOR) 145 MG tablet Take 1 tablet (145 mg total) by mouth daily.  90 tablet  3  . Fluticasone-Salmeterol (ADVAIR DISKUS) 500-50 MCG/DOSE AEPB Inhale 1 puff into the lungs every 12 (twelve) hours.  2 each  3  . insulin glargine (LANTUS) 100 UNIT/ML injection Inject 40 in the morning and 35 in the evening .  15 mL  4  . lisinopril (ZESTRIL) 2.5 MG tablet Take 1 tablet (2.5 mg total) by mouth daily.  30 tablet  3  . metFORMIN (GLUCOPHAGE) 1000 MG tablet TAKE ONE TABLET BY MOUTH TWICE DAILY  WITH MEALS  60 tablet  2  . niacin (NIASPAN) 500 MG CR tablet Take 1 tablet (500 mg total) by mouth 2 (two) times daily.  60 tablet  5  . omega-3 acid ethyl esters (LOVAZA) 1 G capsule Take 2 capsules (2 g total) by mouth 2 (two) times daily.  360 capsule  2  . omeprazole (PRILOSEC) 20 MG capsule Take 1 capsule (20 mg total) by mouth daily.  30 capsule  2  . pravastatin (PRAVACHOL) 40 MG tablet TAKE ONE TABLET BY MOUTH EVERY DAY  30 tablet  6  . tiotropium (SPIRIVA HANDIHALER) 18 MCG inhalation capsule Place 1 capsule (18 mcg total) into inhaler and inhale daily.  90 capsule  11   No current facility-administered medications for this visit.   Family History  Problem Relation Age of Onset  . Colon polyps Mother     benign  . Cancer  Mother     ovarian cancer  . Cancer Maternal Aunt     breast cancer, ovarian cancer  . Diabetes Maternal Uncle   . Cancer Father     Prostate Cancer    History   Social History  . Marital Status: Married    Spouse Name: N/A    Number of Children: N/A  . Years of Education: N/A   Social History Main Topics  . Smoking status: Current Every Day Smoker -- 1.00 packs/day for 16 years    Types: Cigarettes  . Smokeless tobacco: None  . Alcohol Use: No     Comment: Seldom  . Drug Use: No  . Sexually Active: None   Other Topics Concern  . None   Social History Narrative   Drinks at least a pot of coffee every day.    Review of Systems: Constitutional: Denies fever, chills, diaphoresis, appetite change. +fatigue.  HEENT: Denies photophobia, eye pain, redness, hearing loss, ear pain, congestion, sore throat, rhinorrhea, sneezing, mouth sores, trouble swallowing, neck pain, neck stiffness and tinnitus.   Respiratory: + SOB, DOE. Denies cough, chest tightness,  and wheezing.   Cardiovascular: Denies chest pain, palpitations and leg swelling.  Gastrointestinal: + constant abdominal pain, intermittent loose stools and constipation, intermittent abdominal distension, and nausea. Denies vomiting or blood in stool.  Genitourinary: Denies dysuria, urgency, frequency, hematuria, flank pain and difficulty urinating.  Endocrine: Denies: hot or cold intolerance, sweats, changes in hair or nails, polyuria, polydipsia. Musculoskeletal: Denies myalgias, back pain, joint swelling, arthralgias and gait problem.  Skin: Denies pallor, rash and wound.  Neurological: Denies dizziness, seizures, syncope, weakness, light-headedness, numbness and headaches. y  Psychiatric/Behavioral: Depressed mood and sadness, anxiety, and trouble sleeping   Objective:  Physical Exam: Filed Vitals:   01/09/13 1409  BP: 137/81  Pulse: 120  Temp: 97.2 F (36.2 C)  TempSrc: Oral  Height: 5' 2.5" (1.588 m)  Weight:  177 lb 12.8 oz (80.65 kg)  SpO2: 92%   Constitutional: Vital signs reviewed.  Patient is a well-developed and well-nourished female in no acute distress and cooperative with exam. Alert.  Head: Normocephalic and atraumatic Mouth: no erythema or exudates, MMM Eyes: PERRL, EOMI, conjunctivae normal, No scleral icterus.  Neck: Supple, Trachea midline normal ROM Cardiovascular: Tachycardic, no MRG, pulses symmetric and intact bilaterally Pulmonary/Chest: normal respiratory effort, CTAB, no wheezes, rales, or rhonchi Abdominal: Diffusely TTP, distended, but soft, mild tympany in LUQ. No fluid wave. Bowel sounds presents but diminished, no masses, organomegaly, or guarding present.  Musculoskeletal: No joint deformities, erythema, or stiffness, ROM full and  no nontender  Neurological: A&O x3, Strength is normal and symmetric bilaterally, cranial nerve II-XII are grossly intact, no focal motor deficit, sensory intact to light touch bilaterally.  Skin: Warm, dry and intact. No rash, cyanosis, or clubbing.  Psychiatric: Depressed mood and flat affect. Tearful about her father and other social stressors.  Assessment & Plan:   Please refer to Problem List based Assessment and Plan

## 2013-01-09 NOTE — Assessment & Plan Note (Addendum)
Abdominal pain and distention with intermittent constipation and loose stools is most consistent with IBS according to the ROME III criteria. She is already taking probiotics which she is to continue. I also recommended that she start on a low FODMAP diet (fermentable oligosaccharides, disaccharides, monosaccharides, and polyols), which includes foods such as dairy products fruits and wheats. She's to try this and followup in one month.  Also, she has never had a colonoscopy but cannot afford to have one since she has no insurance. She denies any blood in her stool. Will get her to check stool cards x3.

## 2013-01-09 NOTE — Progress Notes (Signed)
Info on mammogram scholarship given to pt to call [facility]. [facility] [place] Goeken RN 01/09/13 4PM

## 2013-01-09 NOTE — Assessment & Plan Note (Addendum)
   Lab Results  Component Value Date   HGBA1C 9.9 11/11/2012   HGBA1C 9.1 04/15/2012   HGBA1C 10.0 01/01/2012     Assessment: Diabetes control: poor control (HgbA1C >9%) Progress toward A1C goal:  unchanged  Plan: Medications:  Increas Lantus to 45u in the morning and 40u in the evening Home glucose monitoring: Frequency:  (once a week) Timing:   Instruction/counseling given: reminded to get eye exam, reminded to bring blood glucose meter & log to each visit and discussed diet Educational resources provided: brochure;handout Self management tools provided:   Other plans: See below   She's currently on Lantus 40 units morning 35 units at night and metformin 1000 mg twice a day. She states that her CBG this morning was 135. However the clinic today was 295. With her social stressors her diet has been poor. She checks her sugars maybe once a week. She did not bring in her glucometer today, and states that she really doesn't use it for herself but uses it mostly for her husband because they cannot afford to buy him one. Over the next month she is to work on her diet, and was asked to keep a record of her blood sugars. I would like to increase her lantus however in the past she was having issues with hypoglycemic episodes. Will increase her morning Lantus to 45u and keep her evening dose at 35 units for now.

## 2013-01-10 ENCOUNTER — Other Ambulatory Visit: Payer: Self-pay | Admitting: *Deleted

## 2013-01-10 DIAGNOSIS — F341 Dysthymic disorder: Secondary | ICD-10-CM

## 2013-01-10 LAB — LIPID PANEL: HDL: 26 mg/dL — ABNORMAL LOW (ref >39)

## 2013-01-13 MED ORDER — ALPRAZOLAM 0.5 MG PO TABS: 0.5000 mg | ORAL_TABLET | Freq: Three times a day (TID) | ORAL | Status: DC | PRN

## 2013-01-13 NOTE — Progress Notes (Signed)
Case discussed with Dr. Glenn at the time of the visit.  We reviewed the resident's history and exam and pertinent patient test results.  I agree with the assessment, diagnosis, and plan of care documented in the resident's note.   

## 2013-01-13 NOTE — Addendum Note (Signed)
Addended by: Genelle Gather on: 01/13/2013 09:16 AM   Modules accepted: Orders

## 2013-01-13 NOTE — Addendum Note (Signed)
Addended by: Genelle Gather on: 01/13/2013 09:02 AM   Modules accepted: Orders

## 2013-01-17 ENCOUNTER — Other Ambulatory Visit: Payer: Self-pay

## 2013-01-20 ENCOUNTER — Other Ambulatory Visit: Payer: Self-pay

## 2013-01-21 NOTE — Telephone Encounter (Signed)
Alprazolam 0.5mg  rx called to [facility].

## 2013-01-23 ENCOUNTER — Other Ambulatory Visit: Payer: Self-pay

## 2013-01-23 ENCOUNTER — Encounter: Payer: Self-pay | Admitting: Internal Medicine

## 2013-02-03 ENCOUNTER — Other Ambulatory Visit: Payer: Self-pay

## 2013-02-03 ENCOUNTER — Ambulatory Visit (HOSPITAL_COMMUNITY): Payer: Self-pay

## 2013-02-10 ENCOUNTER — Other Ambulatory Visit: Payer: Self-pay | Admitting: Internal Medicine

## 2013-02-10 DIAGNOSIS — E1165 Type 2 diabetes mellitus with hyperglycemia: Secondary | ICD-10-CM

## 2013-02-10 DIAGNOSIS — E785 Hyperlipidemia, unspecified: Secondary | ICD-10-CM

## 2013-02-10 DIAGNOSIS — E781 Pure hyperglyceridemia: Secondary | ICD-10-CM

## 2013-02-10 MED ORDER — PRAVASTATIN SODIUM 80 MG PO TABS: ORAL_TABLET | ORAL | Status: DC

## 2013-02-10 NOTE — Progress Notes (Signed)
A lipid panel was drawn on 01/09/13 which showed severely elevated triglycerides and a significantly elevated total cholesterol. LDL was unable to be calculated. I have tried to get Mrs. Vanessa [name] to the clinic a number of times to have her fasting lipid panel drawn. She cancelled her lab appt for last week and said that she would be here this morning, but she no showed again. Even without a fasting lipid panel, I feel that we can definitely increase her statin, so I have increased it to 80mg  po daily.   I attempted to call Mrs. Vanessa [name], but there was no answer on her home phone or cell phone. I did leave a message that her pravastatin dose was being increased from 40 to 80mg  and left the clinic number to call with questions.   I will also have my nurse give her a call to confirm that she received the message and is taking the prescription as directed.

## 2013-02-13 ENCOUNTER — Encounter: Payer: Self-pay | Admitting: Internal Medicine

## 2013-03-06 ENCOUNTER — Encounter: Payer: Self-pay | Admitting: Internal Medicine

## 2013-03-11 ENCOUNTER — Other Ambulatory Visit: Payer: Self-pay | Admitting: *Deleted

## 2013-03-24 ENCOUNTER — Other Ambulatory Visit: Payer: Self-pay | Admitting: *Deleted

## 2013-03-24 DIAGNOSIS — E781 Pure hyperglyceridemia: Secondary | ICD-10-CM

## 2013-03-27 MED ORDER — NIACIN ER (ANTIHYPERLIPIDEMIC) 500 MG PO TBCR: 500.0000 mg | EXTENDED_RELEASE_TABLET | Freq: Two times a day (BID) | ORAL | Status: DC

## 2013-03-27 MED ORDER — OMEGA-3-ACID ETHYL ESTERS 1 G PO CAPS: 2.0000 g | ORAL_CAPSULE | Freq: Two times a day (BID) | ORAL | Status: DC

## 2013-03-27 NOTE — Telephone Encounter (Signed)
[facility] pharmacy informed of Lovaza and Niaspan rx refills.

## 2013-04-01 NOTE — Telephone Encounter (Signed)
Closed

## 2013-04-08 ENCOUNTER — Other Ambulatory Visit (HOSPITAL_COMMUNITY): Payer: Self-pay | Admitting: Internal Medicine

## 2013-04-08 DIAGNOSIS — Z1231 Encounter for screening mammogram for malignant neoplasm of breast: Secondary | ICD-10-CM

## 2013-04-10 ENCOUNTER — Encounter: Payer: Self-pay | Admitting: Internal Medicine

## 2013-04-25 ENCOUNTER — Ambulatory Visit (HOSPITAL_COMMUNITY)
Admission: RE | Admit: 2013-04-25 | Discharge: 2013-04-25 | Disposition: A | Discharge status: Home / Self Care | Payer: No Typology Code available for payment source | Source: Ambulatory Visit | Attending: Internal Medicine | Admitting: Internal Medicine

## 2013-04-25 DIAGNOSIS — Z1231 Encounter for screening mammogram for malignant neoplasm of breast: Secondary | ICD-10-CM

## 2013-05-01 ENCOUNTER — Encounter: Payer: Self-pay | Admitting: Internal Medicine

## 2013-05-01 ENCOUNTER — Ambulatory Visit (INDEPENDENT_AMBULATORY_CARE_PROVIDER_SITE_OTHER): Payer: Self-pay | Admitting: Internal Medicine

## 2013-05-01 ENCOUNTER — Ambulatory Visit: Payer: No Typology Code available for payment source

## 2013-05-01 VITALS — BP 126/72 | HR 95 | Temp 97.1°F | Ht 62.0 in | Wt 174.6 lb

## 2013-05-01 DIAGNOSIS — Z299 Encounter for prophylactic measures, unspecified: Secondary | ICD-10-CM

## 2013-05-01 DIAGNOSIS — F17200 Nicotine dependence, unspecified, uncomplicated: Secondary | ICD-10-CM

## 2013-05-01 DIAGNOSIS — Z0000 Encounter for general adult medical examination without abnormal findings: Secondary | ICD-10-CM

## 2013-05-01 DIAGNOSIS — F341 Dysthymic disorder: Secondary | ICD-10-CM

## 2013-05-01 DIAGNOSIS — M25551 Pain in right hip: Secondary | ICD-10-CM

## 2013-05-01 DIAGNOSIS — Z1231 Encounter for screening mammogram for malignant neoplasm of breast: Secondary | ICD-10-CM

## 2013-05-01 DIAGNOSIS — Z23 Encounter for immunization: Secondary | ICD-10-CM

## 2013-05-01 DIAGNOSIS — M25559 Pain in unspecified hip: Secondary | ICD-10-CM

## 2013-05-01 DIAGNOSIS — E785 Hyperlipidemia, unspecified: Secondary | ICD-10-CM

## 2013-05-01 DIAGNOSIS — N950 Postmenopausal bleeding: Secondary | ICD-10-CM | POA: Insufficient documentation

## 2013-05-01 DIAGNOSIS — K589 Irritable bowel syndrome without diarrhea: Secondary | ICD-10-CM

## 2013-05-01 DIAGNOSIS — E781 Pure hyperglyceridemia: Secondary | ICD-10-CM

## 2013-05-01 DIAGNOSIS — E1165 Type 2 diabetes mellitus with hyperglycemia: Secondary | ICD-10-CM

## 2013-05-01 LAB — POCT GLYCOSYLATED HEMOGLOBIN (HGB A1C): Hemoglobin A1C: 7.8

## 2013-05-01 MED ORDER — LISINOPRIL 2.5 MG PO TABS: 2.5000 mg | ORAL_TABLET | Freq: Every day | ORAL | Status: DC

## 2013-05-01 MED ORDER — ALPRAZOLAM 0.5 MG PO TABS: 0.5000 mg | ORAL_TABLET | Freq: Three times a day (TID) | ORAL | Status: DC | PRN

## 2013-05-01 NOTE — Patient Instructions (Addendum)
**   Please return to the clinic on Monday for a fasting lipid panel. I will also be checking your electrolytes and liver function. Be sure to come in to the clinic in the morning without eating anything after midnight.  ** Please be sure to get your eye exam as soon as possible.  **I have referred you to the OBGYN due to the menstrual period you recently had after no period or spotting for 9 months before that. Please be sure to go to this appointment!  **Be sure to check your stool using the stool cards for 3 separate occurences, and bring them to the clinic  Guaiac Test A guaiac test checks for blood in the poop (bowel movement). BEFORE THE TEST  Avoid alcohol and iron pills.  Only take medicine as told by your doctor.  Ask your doctor about stopping or changing medicines.  Avoid Vitamin C pills or medicines.  Avoid antiseptic solution that has iodine in it.  Avoid citrus fruits and juices. TEST  Put the sample cards and sticks in the bathroom so they will be ready when you have to poop.  Collect a poop sample with the stick.  Smear a small amount of poop on the card.  Take another sample from a different part of the poop.  Store the cards with the samples in a plastic bag.  Throw the rest of the poop into the toilet and flush.  Wash your hands well. AFTER THE TEST  Bring the cards in a plastic bag to your clinic.  Do not wait more than 3 days to take the card to your clinic. Document Released: 03/21/2008 Document Revised: 09/04/2011 Document Reviewed: 03/21/2008 [facility] Patient Information 2014 [place], [place].

## 2013-05-01 NOTE — Progress Notes (Signed)
Patient ID: JESUSITA JOCELYN, female   DOB: 12/10/1960, 52 y.o.   MRN: [id number]  Subjective:   Patient ID: EMMELY BITTINGER female   DOB: 10-18-1960 52 y.o.   MRN: [id number]  HPI: Ms.Shavy L Wareing is a 52 y.o. F with PMH depression/anxiety, cigarette abuse, DM2, HTN presents for a follow up for her DM2, HTN, and complains of right hip pain.  I have been trying to get her into the clinic for the past 3-4 months to check a fasting lipid panel on her, as her TG in July were >4000 and total cholesterol was >400. She has not been into the clinic since her last visit in July due to social issues. She is has been maxed out on medical therapy; however today she reports that she has not been taking her statin over the past year 2/2 to muscle aches.  She did not bring in her glucometer today. She is taking her Lantus 45u qam and 35u qhs. She denies any hypoglycemic events. CBG is 177 w/ A1c of 7.8.  At her last visit, she was c/o abd pain, diarrhea, and constipation, which was attributed to IBS. Her abdominal distension, pain, and diarrhea/constipation is improving since starting probiotics and taking antireflux medications since her last clinic visit. She states that she attributes her sx to the stresses in her life. She states that the increase in Cymbalta to 60mg  BID has also seemed to help improve her sx.   She does c/o R hip pain that has been going on for the past few weeks, with pain that begins in her right lower back near the iliac crest radiating into "my right ovary." She also states that she has not had a period in about 9 months until about 1 month ago when she states that she had a full period. She denies any history of spotting.    Of note, her father is dying of cancer and has recently entered hospice. She has a great deal of sadness regarding his illness. She states that she also looks after her mother who is not in the best health. She is also the primary care giver for her husband who has a h/o of heart  disease, which is another stressor for the patient contributing to her depression/anxiety. She does feel that the Cymbalta and Xanax help improve her depression and anxiety.   Past Medical History  Diagnosis Date  . Asthma     No PFT performed  . Diabetes mellitus 2002  . Depression   . Panic attacks     mostly Agaraphobia  . GERD (gastroesophageal reflux disease)   . Helicobacter pylori (H. pylori) infection     s/p triple therapy  . Hyperlipidemia   . Hepatic hemangioma   . Hypertriglyceridemia    Current Outpatient Prescriptions  Medication Sig Dispense Refill  . albuterol (PROVENTIL HFA;VENTOLIN HFA) 108 (90 BASE) MCG/ACT inhaler Inhale 2 puffs into the lungs every 6 (six) hours as needed for wheezing.  1 Inhaler  6  . albuterol (PROVENTIL,VENTOLIN) 90 MCG/ACT inhaler Inhale 1-2 puffs into the lungs every 4 (four) hours as needed for wheezing.  1 each  3  . ALPRAZolam (XANAX) 0.5 MG tablet Take 1 tablet (0.5 mg total) by mouth 3 (three) times daily as needed for sleep or anxiety.  90 tablet  3  . aspirin 81 MG tablet Take 1 tablet (81 mg total) by mouth daily.  30 tablet  2  . DULoxetine (CYMBALTA) 30 MG capsule Take  1 capsule (30 mg total) by mouth at bedtime.  7 capsule  0  . DULoxetine (CYMBALTA) 60 MG capsule Take 1 capsule (60 mg total) by mouth 2 (two) times daily.  60 capsule  6  . esomeprazole (NEXIUM) 20 MG capsule Take 20 mg by mouth daily before breakfast.      . fenofibrate (TRICOR) 145 MG tablet Take 1 tablet (145 mg total) by mouth daily.  90 tablet  3  . Fluticasone-Salmeterol (ADVAIR DISKUS) 500-50 MCG/DOSE AEPB Inhale 1 puff into the lungs every 12 (twelve) hours.  2 each  6  . insulin glargine (LANTUS) 100 UNIT/ML injection Inject 45 in the morning and 35 in the evening .  15 mL  4  . lisinopril (ZESTRIL) 2.5 MG tablet Take 1 tablet (2.5 mg total) by mouth daily.  30 tablet  3  . metFORMIN (GLUCOPHAGE) 1000 MG tablet TAKE ONE TABLET BY MOUTH TWICE DAILY WITH MEALS   60 tablet  2  . niacin (NIASPAN) 500 MG CR tablet Take 1 tablet (500 mg total) by mouth 2 (two) times daily.  180 tablet  0  . omega-3 acid ethyl esters (LOVAZA) 1 G capsule Take 2 capsules (2 g total) by mouth 2 (two) times daily.  360 capsule  0  . omeprazole (PRILOSEC) 20 MG capsule Take 1 capsule (20 mg total) by mouth daily.  30 capsule  2  . pravastatin (PRAVACHOL) 80 MG tablet TAKE ONE TABLET BY MOUTH EVERY DAY  30 tablet  6  . tiotropium (SPIRIVA HANDIHALER) 18 MCG inhalation capsule Place 1 capsule (18 mcg total) into inhaler and inhale daily.  90 capsule  11   No current facility-administered medications for this visit.   Family History  Problem Relation Age of Onset  . Colon polyps Mother     benign  . Cancer Mother     ovarian cancer  . Cancer Maternal Aunt     breast cancer, ovarian cancer  . Diabetes Maternal Uncle   . Cancer Father     Prostate Cancer    History   Social History  . Marital Status: Married    Spouse Name: N/A    Number of Children: N/A  . Years of Education: N/A   Social History Main Topics  . Smoking status: Current Every Day Smoker -- 1.00 packs/day for 16 years    Types: Cigarettes  . Smokeless tobacco: None  . Alcohol Use: No     Comment: Seldom  . Drug Use: No  . Sexual Activity: None   Other Topics Concern  . None   Social History Narrative   Drinks at least a pot of coffee every day.    Review of Systems: A 12 point ROS was performed; pertinent positives and negatives were noted in the HPI   Objective:  Physical Exam: Filed Vitals:   05/01/13 1423  BP: 138/84  Pulse: 96  Temp: 97.1 F (36.2 C)  TempSrc: Oral  Height: 5\' 2"  (1.575 m)  Weight: 174 lb 9.6 oz (79.198 kg)  SpO2: 94%   Constitutional: Vital signs reviewed.  Patient is a well-developed and well-nourished female in no acute distress and cooperative with exam.  Head: Normocephalic and atraumatic Eyes: PERRL, EOMI, conjunctivae normal, No scleral icterus.   Cardiovascular: RRR, no MRG, pulses symmetric and intact bilaterally Pulmonary/Chest: normal respiratory effort, CTAB, no wheezes, rales, or rhonchi Abdominal: Soft. Non-tender. Distended, bowel sounds present, no masses or organomegaly palpable, no guarding present.  GU: TTP in  R adnexa. No CVA tenderness Musculoskeletal: No joint deformities, erythema, or stiffness, ROM full. TTP at R hip at femoral head insertion site.  Hematology: No inguinal adenopathy palpable.  Neurological: A&O x3, Strength is normal and symmetric bilaterally, cranial nerve II-XII are grossly intact, no focal motor deficit, sensory intact to light touch bilaterally.  Skin: Warm, dry and intact. No rash, cyanosis, or clubbing.  Psychiatric: Tearful intermittently, but overall very composed. Speech and language normal. Memory intact.   Assessment & Plan:   Please refer to Problem List based Assessment and Plan

## 2013-05-01 NOTE — Addendum Note (Signed)
Addended by: Charlsie Merles F on: 05/01/2013 05:35 PM   Modules accepted: Orders

## 2013-05-03 NOTE — Assessment & Plan Note (Signed)
Pt is due for her colonoscopy and mammogram, but has no insurance and cannot afford them at this time. I have given her a set of stool cards to use on 3 separate occasions and return to clinic to be checked for hemoccult. I have asked her to do this before but she did not. Hopefully she will follow through this time.

## 2013-05-03 NOTE — Assessment & Plan Note (Signed)
Pt continues to have significant social stressors and has been the primary care giver for her husband and mother, and for her father, who is now in hospice care. She states that through hospice, she and her family are receiving counseling. She is also on Cymbalta 60mg  po BID and Xanax 0.5mg  po TID, which are helping with her depression/anxiety. She appears much more calm and composed today, and states that the increase in the Cymbalta to 60mg  BID has really helped her sx. Will continue current regimen.

## 2013-05-03 NOTE — Assessment & Plan Note (Signed)
Lab Results  Component Value Date   HGBA1C 7.8 05/01/2013   HGBA1C 9.9 11/11/2012   HGBA1C 9.1 04/15/2012     Assessment: Diabetes control: fair control Progress toward A1C goal:  improved Comments: A1c is actually improved. She did not bring her meter in today, but states that she is taking her insulin and metformin. She has not had her eye exam yet but states that she is going soon.   Plan: Medications:  continue current medications Home glucose monitoring: Frequency: once a day Timing: before meals Instruction/counseling given: reminded to get eye exam Educational resources provided: brochure;handout Self management tools provided:   Other plans: Pt is to continue to take her insulin and metformin. She is to work on diet and trying to get exercise. She is to f/u in 26mo

## 2013-05-03 NOTE — Assessment & Plan Note (Addendum)
I've been trying to get her back in to the clinic to recheck a fasting lipid panel, but she has been unable to make her appointments. She is currently on a fibrate and Lovaza. She was on a statin but stopped taking it 2/2 myalgias. Per pt she stopped about 1 year ago; however at her last clinic visit she stated that she was still taking it. She promises me that she will come in on Monday morning for her fasting lipid panel.

## 2013-05-05 DIAGNOSIS — M25551 Pain in right hip: Secondary | ICD-10-CM | POA: Insufficient documentation

## 2013-05-05 NOTE — Assessment & Plan Note (Signed)
  Assessment: Progress toward smoking cessation:  smoking the same amount (1ppd) Barriers to progress toward smoking cessation:  stress  Plan: Instruction/counseling given:  I counseled patient on the dangers of tobacco use, advised patient to stop smoking, and reviewed strategies to maximize success. Educational resources provided:  [facility] (1-800-QUIT-NOW) brochure Self management tools provided:    Medications to assist with smoking cessation:  None

## 2013-05-05 NOTE — Assessment & Plan Note (Signed)
Per patient symptoms have improved, and her stools are more regular. She states that she began taking probiotics and an antacid since she was last seen in the clinic, which have improved her symptoms. She states that she'll see feels that the increase in Cymbalta, taking it 60 mg twice a day, has also improved her symptoms.

## 2013-05-05 NOTE — Assessment & Plan Note (Signed)
Patient states that she has not had a period for 9 months up until last month, when she had a full period. She denies a history of spotting, but does endorse some right lower quadrant pain. She states his pain from her mid lumbosacral region that radiates around her right side and into her "right ovary". On exam patient with right adnexal tenderness, no palpable masses appreciated. Will refer to GYN for further evaluation, the patient is amenable to this.

## 2013-05-05 NOTE — Assessment & Plan Note (Signed)
Permission, she's been having right hip pain, that appears to be right at the joint space. She says she's been having pain for the past few weeks, which is improved with ibuprofen. She feels like her hip pain is related to pain in her right lower part her. She does have full range motion of her right hip, but does have mild pain with abduction and adduction at the hip. Patient refuses to have an x-ray at this point in time, because she got a pocket for it. She states that she wants to try ibuprofen for her pain, though limited if it worsens at that time she will get imaging done. I am wondering if this is some sort of bursitis, as she is tender to palpation at her right lateral hip superior joint space.

## 2013-05-12 ENCOUNTER — Telehealth: Payer: Self-pay | Admitting: *Deleted

## 2013-05-12 ENCOUNTER — Other Ambulatory Visit: Payer: Self-pay | Admitting: *Deleted

## 2013-05-12 DIAGNOSIS — E1165 Type 2 diabetes mellitus with hyperglycemia: Secondary | ICD-10-CM

## 2013-05-12 MED ORDER — INSULIN GLARGINE 100 UNIT/ML ~~LOC~~ SOLN: SUBCUTANEOUS | Status: DC

## 2013-05-12 NOTE — Progress Notes (Signed)
Case discussed with Dr. Glenn at the time of the visit.  We reviewed the resident's history and exam and pertinent patient test results.  I agree with the assessment, diagnosis, and plan of care documented in the resident's note.   

## 2013-05-12 NOTE — Telephone Encounter (Signed)
[facility] pharmacy informed of Lantus rx refill. 

## 2013-05-12 NOTE — Telephone Encounter (Signed)
Thanks. You may use my DEA # to get the pt her med.

## 2013-05-12 NOTE — Telephone Encounter (Signed)
Dr Donnelly Stager DEA# given to [facility] pharmacy for Xanax rx refill.

## 2013-05-12 NOTE — Telephone Encounter (Signed)
Pt had called this morning about Dr Gordy Councilman DEA # had expired per [facility]. I talked to them this morning who ran MD's # thru again; still showed her # had expired. I talked to Dr Rogelia Boga; she checked w/Gaye; stated Dr Gordy Councilman [facility] # has not expired. I called [facility] back; still shows her # has expired. Stated she will call [employer] help desk; thinks it might be a [employer] issue.

## 2013-05-25 ENCOUNTER — Encounter (HOSPITAL_COMMUNITY): Payer: Self-pay | Admitting: Emergency Medicine

## 2013-05-25 ENCOUNTER — Emergency Department (HOSPITAL_COMMUNITY)
Admission: EM | Admit: 2013-05-25 | Discharge: 2013-05-26 | Disposition: A | Discharge status: Home / Self Care | Payer: No Typology Code available for payment source | Attending: Emergency Medicine | Admitting: Emergency Medicine

## 2013-05-25 DIAGNOSIS — K219 Gastro-esophageal reflux disease without esophagitis: Secondary | ICD-10-CM | POA: Insufficient documentation

## 2013-05-25 DIAGNOSIS — Z794 Long term (current) use of insulin: Secondary | ICD-10-CM | POA: Insufficient documentation

## 2013-05-25 DIAGNOSIS — R109 Unspecified abdominal pain: Secondary | ICD-10-CM | POA: Insufficient documentation

## 2013-05-25 DIAGNOSIS — F329 Major depressive disorder, single episode, unspecified: Secondary | ICD-10-CM | POA: Insufficient documentation

## 2013-05-25 DIAGNOSIS — E119 Type 2 diabetes mellitus without complications: Secondary | ICD-10-CM | POA: Insufficient documentation

## 2013-05-25 DIAGNOSIS — Z7982 Long term (current) use of aspirin: Secondary | ICD-10-CM | POA: Insufficient documentation

## 2013-05-25 DIAGNOSIS — E785 Hyperlipidemia, unspecified: Secondary | ICD-10-CM | POA: Insufficient documentation

## 2013-05-25 DIAGNOSIS — R339 Retention of urine, unspecified: Secondary | ICD-10-CM | POA: Insufficient documentation

## 2013-05-25 DIAGNOSIS — M549 Dorsalgia, unspecified: Secondary | ICD-10-CM | POA: Insufficient documentation

## 2013-05-25 DIAGNOSIS — F17200 Nicotine dependence, unspecified, uncomplicated: Secondary | ICD-10-CM | POA: Insufficient documentation

## 2013-05-25 DIAGNOSIS — F410 Panic disorder [episodic paroxysmal anxiety] without agoraphobia: Secondary | ICD-10-CM | POA: Insufficient documentation

## 2013-05-25 DIAGNOSIS — Z79899 Other long term (current) drug therapy: Secondary | ICD-10-CM | POA: Insufficient documentation

## 2013-05-25 DIAGNOSIS — R300 Dysuria: Secondary | ICD-10-CM | POA: Insufficient documentation

## 2013-05-25 DIAGNOSIS — F3289 Other specified depressive episodes: Secondary | ICD-10-CM | POA: Insufficient documentation

## 2013-05-25 DIAGNOSIS — R112 Nausea with vomiting, unspecified: Secondary | ICD-10-CM | POA: Insufficient documentation

## 2013-05-25 DIAGNOSIS — M25559 Pain in unspecified hip: Secondary | ICD-10-CM | POA: Insufficient documentation

## 2013-05-25 DIAGNOSIS — J45909 Unspecified asthma, uncomplicated: Secondary | ICD-10-CM | POA: Insufficient documentation

## 2013-05-25 LAB — CBC WITH DIFFERENTIAL/PLATELET
Basophils Absolute: 0.1 10*3/uL (ref 0.0–0.1)
Eosinophils Absolute: 0.5 10*3/uL (ref 0.0–0.7)
Hemoglobin: 16.3 g/dL — ABNORMAL HIGH (ref 12.0–15.0)
Lymphocytes Relative: 31 % (ref 12–46)
Lymphs Abs: 3.6 10*3/uL (ref 0.7–4.0)
Monocytes Relative: 5 % (ref 3–12)
Neutro Abs: 6.8 10*3/uL (ref 1.7–7.7)
Neutrophils Relative %: 59 % (ref 43–77)
Platelets: 330 10*3/uL (ref 150–400)
RBC: 5.14 MIL/uL — ABNORMAL HIGH (ref 3.87–5.11)
WBC: 11.6 10*3/uL — ABNORMAL HIGH (ref 4.0–10.5)

## 2013-05-25 LAB — COMPREHENSIVE METABOLIC PANEL
ALT: <5 U/L (ref 0–35)
AST: <5 U/L (ref 0–37)
Alkaline Phosphatase: 114 U/L (ref 39–117)
CO2: 23 mEq/L (ref 19–32)
Chloride: 92 mEq/L — ABNORMAL LOW (ref 96–112)
GFR calc Af Amer: >90 mL/min (ref >90)
Glucose, Bld: 214 mg/dL — ABNORMAL HIGH (ref 70–99)
Potassium: 4.3 mEq/L (ref 3.5–5.1)
Sodium: 129 mEq/L — ABNORMAL LOW (ref 135–145)
Total Bilirubin: 0.2 mg/dL — ABNORMAL LOW (ref 0.3–1.2)
Total Protein: 7 g/dL (ref 6.0–8.3)

## 2013-05-25 LAB — URINALYSIS, ROUTINE W REFLEX MICROSCOPIC
Bilirubin Urine: NEGATIVE
Hgb urine dipstick: NEGATIVE
Ketones, ur: NEGATIVE mg/dL
Nitrite: NEGATIVE
Specific Gravity, Urine: 1.008 (ref 1.005–1.030)
Urobilinogen, UA: 0.2 mg/dL (ref 0.0–1.0)
pH: 5.5 (ref 5.0–8.0)

## 2013-05-25 MED ORDER — ONDANSETRON 4 MG PO TBDP
8.0000 mg | ORAL_TABLET | Freq: Once | ORAL | Status: AC
  Administered 2013-05-25: 8 mg via ORAL
  Filled 2013-05-25: qty 2

## 2013-05-25 MED ORDER — MORPHINE SULFATE 4 MG/ML IJ SOLN
6.0000 mg | Freq: Once | INTRAMUSCULAR | Status: AC
  Administered 2013-05-26: 6 mg via INTRAVENOUS
  Filled 2013-05-25: qty 2

## 2013-05-25 MED ORDER — SODIUM CHLORIDE 0.9 % IV SOLN
1000.0000 mL | Freq: Once | INTRAVENOUS | Status: AC
  Administered 2013-05-26: 1000 mL via INTRAVENOUS

## 2013-05-25 MED ORDER — SODIUM CHLORIDE 0.9 % IV SOLN
1000.0000 mL | INTRAVENOUS | Status: DC
  Administered 2013-05-26: 1000 mL via INTRAVENOUS

## 2013-05-25 NOTE — ED Notes (Addendum)
C/o upper mid abd pain, also radiates around R side to R flank/back, also nv. Also reports dysuria and urinary retention, "has to go but cannot empty bladder". Also mentions R hip pain. Sx onset 1 week ago. Hip pain has been longer. Last BM this am, (constipated, "normal for her", fluctuates). Last ate 1000 (kept food down). Last emesis last night.

## 2013-05-26 ENCOUNTER — Other Ambulatory Visit: Payer: Self-pay | Admitting: *Deleted

## 2013-05-26 ENCOUNTER — Emergency Department (HOSPITAL_COMMUNITY): Payer: No Typology Code available for payment source

## 2013-05-26 ENCOUNTER — Encounter (HOSPITAL_COMMUNITY): Payer: Self-pay | Admitting: Radiology

## 2013-05-26 DIAGNOSIS — E1165 Type 2 diabetes mellitus with hyperglycemia: Secondary | ICD-10-CM

## 2013-05-26 MED ORDER — PROMETHAZINE HCL 25 MG PO TABS: 25.0000 mg | ORAL_TABLET | Freq: Four times a day (QID) | ORAL | Status: DC | PRN

## 2013-05-26 MED ORDER — HYDROCODONE-ACETAMINOPHEN 5-325 MG PO TABS: 1.0000 | ORAL_TABLET | ORAL | Status: DC | PRN

## 2013-05-26 MED ORDER — MORPHINE SULFATE 4 MG/ML IJ SOLN
6.0000 mg | Freq: Once | INTRAMUSCULAR | Status: AC
  Administered 2013-05-26: 6 mg via INTRAVENOUS
  Filled 2013-05-26: qty 2

## 2013-05-26 NOTE — ED Provider Notes (Addendum)
CSN: [id number]     Arrival date & time 05/25/13  2053 History   First MD Initiated Contact with Patient 05/25/13 2307     Chief Complaint  Patient presents with  . Abdominal Pain  . Back Pain  . Nausea  . Dysuria  . Urinary Retention  . Hip Pain    HPI Patient reports ongoing upper abdominal pain as well as right lower abdominal pain.  The right lower abdominal pain radiates towards her right flank.  She's had nausea and vomiting.  She denies diarrhea.  She reports urinary frequency.  She's had some chronic right hip pain over the past several months is being followed by primary care physician.  The pain in her upper abdomen as been worsening over the past 24 hours.  She denies current chest pain or shortness of breath.  She has no new rash.  She denies back pain.  No recent sick contacts.  Nothing worsens or improves her symptoms   Past Medical History  Diagnosis Date  . Asthma     No PFT performed  . Diabetes mellitus 2002  . Depression   . Panic attacks     mostly Agaraphobia  . GERD (gastroesophageal reflux disease)   . Helicobacter pylori (H. pylori) infection     s/p triple therapy  . Hyperlipidemia   . Hepatic hemangioma   . Hypertriglyceridemia    Past Surgical History  Procedure Laterality Date  . Cesarean section      with 3 children  . Tubal ligation    . Ovarian cyst removal     Family History  Problem Relation Age of Onset  . Colon polyps Mother     benign  . Cancer Mother     ovarian cancer  . Cancer Maternal Aunt     breast cancer, ovarian cancer  . Diabetes Maternal Uncle   . Cancer Father     Prostate Cancer    History  Substance Use Topics  . Smoking status: Current Every Day Smoker -- 1.00 packs/day for 16 years    Types: Cigarettes  . Smokeless tobacco: Not on file  . Alcohol Use: No     Comment: Seldom   OB History   Grav Para Term Preterm Abortions TAB SAB Ect Mult Living                 Review of Systems  All other systems  reviewed and are negative.    Allergies  Rosiglitazone maleate  Home Medications   Current Outpatient Rx  Name  Route  Sig  Dispense  Refill  . albuterol (PROVENTIL HFA;VENTOLIN HFA) 108 (90 BASE) MCG/ACT inhaler   Inhalation   Inhale 2 puffs into the lungs every 6 (six) hours as needed for wheezing.   1 Inhaler   6   . ALPRAZolam (XANAX) 0.5 MG tablet   Oral   Take 1 tablet (0.5 mg total) by mouth 3 (three) times daily as needed for sleep or anxiety.   90 tablet   3   . aspirin 81 MG tablet   Oral   Take 1 tablet (81 mg total) by mouth daily.   30 tablet   2   . DULoxetine (CYMBALTA) 60 MG capsule   Oral   Take 1 capsule (60 mg total) by mouth 2 (two) times daily.   60 capsule   6   . esomeprazole (NEXIUM) 20 MG capsule   Oral   Take 20 mg by mouth daily before  breakfast.         . fenofibrate (TRICOR) 145 MG tablet   Oral   Take 1 tablet (145 mg total) by mouth daily.   90 tablet   3   . insulin glargine (LANTUS) 100 UNIT/ML injection      Inject 45 in the morning and 35 in the evening .   24 mL   11   . lisinopril (ZESTRIL) 2.5 MG tablet   Oral   Take 1 tablet (2.5 mg total) by mouth daily.   30 tablet   11   . metFORMIN (GLUCOPHAGE) 1000 MG tablet      TAKE ONE TABLET BY MOUTH TWICE DAILY WITH MEALS   60 tablet   2   . niacin (NIASPAN) 500 MG CR tablet   Oral   Take 1 tablet (500 mg total) by mouth 2 (two) times daily.   180 tablet   0   . omega-3 acid ethyl esters (LOVAZA) 1 G capsule   Oral   Take 2 capsules (2 g total) by mouth 2 (two) times daily.   360 capsule   0   . omeprazole (PRILOSEC) 20 MG capsule   Oral   Take 1 capsule (20 mg total) by mouth daily.   30 capsule   2   . tiotropium (SPIRIVA HANDIHALER) 18 MCG inhalation capsule   Inhalation   Place 1 capsule (18 mcg total) into inhaler and inhale daily.   90 capsule   11   . EXPIRED: albuterol (PROVENTIL,VENTOLIN) 90 MCG/ACT inhaler   Inhalation   Inhale 1-2  puffs into the lungs every 4 (four) hours as needed for wheezing.   1 each   3     Early refill approved   . EXPIRED: Fluticasone-Salmeterol (ADVAIR DISKUS) 500-50 MCG/DOSE AEPB   Inhalation   Inhale 1 puff into the lungs every 12 (twelve) hours.   2 each   6   . HYDROcodone-acetaminophen (NORCO/VICODIN) 5-325 MG per tablet   Oral   Take 1 tablet by mouth every 4 (four) hours as needed for moderate pain.   15 tablet   0   . promethazine (PHENERGAN) 25 MG tablet   Oral   Take 1 tablet (25 mg total) by mouth every 6 (six) hours as needed for nausea or vomiting.   12 tablet   0    BP 128/69  Pulse 101  Temp(Src) 98.3 F (36.8 C) (Oral)  Resp 14  SpO2 92%  LMP 04/11/2013 Physical Exam  Nursing note and vitals reviewed. Constitutional: She is oriented to person, place, and time. She appears well-developed and well-nourished. No distress.  HENT:  Head: Normocephalic and atraumatic.  Eyes: EOM are normal.  Neck: Normal range of motion.  Cardiovascular: Normal rate, regular rhythm and normal heart sounds.   Pulmonary/Chest: Effort normal and breath sounds normal.  Abdominal: Soft. She exhibits no distension.  Mild generalized right-sided abdominal tenderness without guarding or rebound  Musculoskeletal: Normal range of motion.  Neurological: She is alert and oriented to person, place, and time.  Skin: Skin is warm and dry.  Psychiatric: She has a normal mood and affect. Judgment normal.    ED Course  Procedures (including critical care time) Labs Review Labs Reviewed  CBC WITH DIFFERENTIAL - Abnormal; Notable for the following:    WBC 11.6 (*)    RBC 5.14 (*)    Hemoglobin 16.3 (*)    HCT 46.2 (*)    All other components within normal limits  COMPREHENSIVE METABOLIC PANEL - Abnormal; Notable for the following:    Sodium 129 (*)    Chloride 92 (*)    Glucose, Bld 214 (*)    Creatinine, Ser 0.35 (*)    Total Bilirubin 0.2 (*)    All other components within  normal limits  URINALYSIS, ROUTINE W REFLEX MICROSCOPIC - Abnormal; Notable for the following:    Glucose, UA 250 (*)    All other components within normal limits  LIPASE, BLOOD  PREGNANCY, URINE   Imaging Review Ct Abdomen Pelvis Wo Contrast  05/26/2013   CLINICAL DATA:  Flank pain and urinary frequency. Mid abdominal and right-sided pain.  EXAM: CT ABDOMEN AND PELVIS WITHOUT CONTRAST  TECHNIQUE: Multidetector CT imaging of the abdomen and pelvis was performed following the standard protocol without intravenous contrast.  COMPARISON:  04/15/2012 abdominal ultrasound.  FINDINGS: Lower Chest: Calcified granuloma at the right lung base. Mild cardiomegaly, without pericardial or pleural effusion.  Abdomen/Pelvis: Hepatomegaly, 20 cm craniocaudal. Moderate hepatic steatosis with sparing adjacent the gallbladder.  Normal spleen, stomach, pancreas, gallbladder, biliary tract, right adrenal gland. Mild left adrenal nodularity, without well-defined mass.  Punctate left renal calculus on coronal image 86. No right renal calculi. No hydronephrosis. No hydroureter or ureteric calculi. Aortic and branch vessel atherosclerosis. No retroperitoneal or retrocrural adenopathy. Normal colon, appendix, and terminal ileum. Normal small bowel without abdominal ascites. No pelvic adenopathy. Normal urinary bladder. Uterus positioned eccentric left. Right ovary upper normal in size at 2.9 cm. No significant free fluid.  Bones/Musculoskeletal:  No acute osseous abnormality.  IMPRESSION: 1. Punctate left renal calculus. No right-sided urinary tract calculi and no hydronephrosis. 2. Hepatomegaly and hepatic steatosis.   Electronically Signed   By: Jeronimo Greaves M.D.   On: 05/26/2013 02:16  I personally reviewed the imaging tests through PACS system I reviewed available ER/hospitalization records through the EMR   EKG Interpretation   None       MDM   1. Abdominal pain    Patient feels much better at this time.  No  obvious cause or source noted of her abdominal pain.  GI followup.  She understands to return to the ER for new or worsening symptoms  New Prescriptions   HYDROCODONE-ACETAMINOPHEN (NORCO/VICODIN) 5-325 MG PER TABLET    Take 1 tablet by mouth every 4 (four) hours as needed for moderate pain.   PROMETHAZINE (PHENERGAN) 25 MG TABLET    Take 1 tablet (25 mg total) by mouth every 6 (six) hours as needed for nausea or vomiting.     Lyanne Co, MD 05/26/13 1610  Lyanne Co, MD 05/26/13 [phone]

## 2013-05-26 NOTE — ED Notes (Signed)
Denies pain or nausea, denies questions, given Rx x2, out with family.

## 2013-05-26 NOTE — ED Notes (Signed)
Ct called to report pt pregnancy test.

## 2013-05-26 NOTE — ED Notes (Signed)
02 91 % Pt placed on 2L nasal cannula

## 2013-05-26 NOTE — ED Notes (Signed)
Declined w/c, steady gait with daughter at side.

## 2013-05-27 ENCOUNTER — Encounter: Payer: Self-pay | Admitting: Internal Medicine

## 2013-05-27 MED ORDER — METFORMIN HCL 1000 MG PO TABS: ORAL_TABLET | ORAL | Status: DC

## 2013-05-29 ENCOUNTER — Telehealth: Payer: Self-pay | Admitting: *Deleted

## 2013-05-29 DIAGNOSIS — K760 Fatty (change of) liver, not elsewhere classified: Secondary | ICD-10-CM

## 2013-05-29 NOTE — Telephone Encounter (Signed)
Pt called and was seen in ED on Sunday.  She was given vicodin for the pain # 15. She will not be able to see the GI doctor until January.  The pain is in side , hip and back. Do you want her to f/u in clinic before ordering more pain meds? Pt # [id number]

## 2013-05-30 DIAGNOSIS — K760 Fatty (change of) liver, not elsewhere classified: Secondary | ICD-10-CM | POA: Insufficient documentation

## 2013-05-30 MED ORDER — OXYCODONE HCL 5 MG PO TABS: 5.0000 mg | ORAL_TABLET | Freq: Four times a day (QID) | ORAL | Status: DC | PRN

## 2013-05-30 NOTE — Telephone Encounter (Signed)
Pt informed and will get Rx.

## 2013-05-30 NOTE — Telephone Encounter (Signed)
Pt called for clarification. Pt in ED Sunday night (11/30) with c/o pain to hip and back with radiation  to abd area.  They did CT and made a f/u appointment with [facility] ( Dr Marina Goodell.  The first available appointment is  06/27/13. CT result - IMPRESSION: 1. Punctate left renal calculus. No right-sided urinary tract calculi and no hydronephrosis. 2. Hepatomegaly and hepatic steatosis.  Pt states pain continues on and off from back and hip going to abd area always on right side. Pain then returns to back.  She rates pain 8/10 and states at times its sharp and shooting. She is constipated and does not feel she empties her bladder when she voids. She is waiting for an appointment with GYN for post-menopausal bleeding. Pt is taking vicodin 5/325 mg forpain and phenergan for nausea.  These meds are helping her.  She is asking for refill. Pt is not able to come to clinic today but can come in next week. Will you give her medication?

## 2013-05-30 NOTE — Telephone Encounter (Signed)
I will give her a one time prescription for oxycodone IR that she can take q6h PRN, disp #60. I want her to avoid the acetaminophen for now given her hepatomegaly and steatosis- not sure how long this has been going on. She is to keep her appointment with GI and with GYN. If her pain continues or worsens between now and her GI appt in January, she needs to call the clinic and will need to be seen before her scheduled appointment in Feb '15.

## 2013-06-03 ENCOUNTER — Ambulatory Visit: Payer: Self-pay | Admitting: Internal Medicine

## 2013-06-03 ENCOUNTER — Encounter: Payer: Self-pay | Admitting: Internal Medicine

## 2013-06-16 ENCOUNTER — Other Ambulatory Visit: Payer: Self-pay | Admitting: *Deleted

## 2013-06-16 DIAGNOSIS — E781 Pure hyperglyceridemia: Secondary | ICD-10-CM

## 2013-06-17 MED ORDER — OMEGA-3-ACID ETHYL ESTERS 1 G PO CAPS: 2.0000 g | ORAL_CAPSULE | Freq: Two times a day (BID) | ORAL | Status: DC

## 2013-06-17 MED ORDER — FENOFIBRATE 145 MG PO TABS: 145.0000 mg | ORAL_TABLET | Freq: Every day | ORAL | Status: DC

## 2013-06-27 ENCOUNTER — Ambulatory Visit: Payer: Self-pay | Admitting: Internal Medicine

## 2013-07-17 ENCOUNTER — Encounter: Payer: Self-pay | Admitting: Obstetrics & Gynecology

## 2013-08-07 ENCOUNTER — Encounter: Payer: Self-pay | Admitting: Internal Medicine

## 2013-08-20 ENCOUNTER — Other Ambulatory Visit: Payer: Self-pay | Admitting: *Deleted

## 2013-08-20 DIAGNOSIS — J449 Chronic obstructive pulmonary disease, unspecified: Secondary | ICD-10-CM

## 2013-08-20 MED ORDER — ALBUTEROL SULFATE HFA 108 (90 BASE) MCG/ACT IN AERS
2.0000 | INHALATION_SPRAY | Freq: Four times a day (QID) | RESPIRATORY_TRACT | Status: DC | PRN
Start: 2013-08-20 — End: 2013-10-10

## 2013-08-20 NOTE — Telephone Encounter (Signed)
Called to pharm 

## 2013-08-22 ENCOUNTER — Encounter: Payer: Self-pay | Admitting: Obstetrics & Gynecology

## 2013-08-29 NOTE — Addendum Note (Signed)
Addended by: Otho Bellows on: 08/29/2013 02:39 PM   Modules accepted: Orders

## 2013-09-03 ENCOUNTER — Other Ambulatory Visit: Payer: Self-pay | Admitting: *Deleted

## 2013-09-03 DIAGNOSIS — E781 Pure hyperglyceridemia: Secondary | ICD-10-CM

## 2013-09-04 ENCOUNTER — Encounter: Payer: Self-pay | Admitting: Internal Medicine

## 2013-09-04 MED ORDER — NIACIN ER (ANTIHYPERLIPIDEMIC) 500 MG PO TBCR: 500.0000 mg | EXTENDED_RELEASE_TABLET | Freq: Two times a day (BID) | ORAL | Status: DC

## 2013-09-04 NOTE — Telephone Encounter (Signed)
Called to [facility], called pt and stressed she must keep April appt

## 2013-09-25 ENCOUNTER — Encounter: Payer: Self-pay | Admitting: Internal Medicine

## 2013-09-25 ENCOUNTER — Ambulatory Visit (INDEPENDENT_AMBULATORY_CARE_PROVIDER_SITE_OTHER): Payer: Self-pay | Admitting: Internal Medicine

## 2013-09-25 VITALS — BP 136/78 | HR 104 | Temp 96.9°F | Ht 62.0 in | Wt 168.1 lb

## 2013-09-25 DIAGNOSIS — F341 Dysthymic disorder: Secondary | ICD-10-CM

## 2013-09-25 DIAGNOSIS — IMO0002 Reserved for concepts with insufficient information to code with codable children: Secondary | ICD-10-CM

## 2013-09-25 DIAGNOSIS — N950 Postmenopausal bleeding: Secondary | ICD-10-CM

## 2013-09-25 DIAGNOSIS — E1165 Type 2 diabetes mellitus with hyperglycemia: Secondary | ICD-10-CM

## 2013-09-25 DIAGNOSIS — IMO0001 Reserved for inherently not codable concepts without codable children: Secondary | ICD-10-CM

## 2013-09-25 DIAGNOSIS — E118 Type 2 diabetes mellitus with unspecified complications: Principal | ICD-10-CM

## 2013-09-25 LAB — GLUCOSE, CAPILLARY: Glucose-Capillary: 253 mg/dL — ABNORMAL HIGH (ref 70–99)

## 2013-09-25 LAB — POCT GLYCOSYLATED HEMOGLOBIN (HGB A1C): Hemoglobin A1C: 9.9

## 2013-09-25 MED ORDER — ALPRAZOLAM 0.5 MG PO TABS: 0.5000 mg | ORAL_TABLET | Freq: Three times a day (TID) | ORAL | Status: DC | PRN

## 2013-09-25 MED ORDER — DULOXETINE HCL 60 MG PO CPEP: 60.0000 mg | ORAL_CAPSULE | Freq: Two times a day (BID) | ORAL | Status: DC

## 2013-09-25 MED ORDER — LISINOPRIL 5 MG PO TABS: 5.0000 mg | ORAL_TABLET | Freq: Every day | ORAL | Status: DC

## 2013-09-25 MED ORDER — METFORMIN HCL 1000 MG PO TABS: ORAL_TABLET | ORAL | Status: DC

## 2013-09-25 NOTE — Patient Instructions (Signed)
We have called to schedule you an appointment at [facility] with the gynecologist. With your post-menopausal bleeding and the significant, persistent bloating you have had over the past number of months, I am very concerned that this could be related to ovarian cancer. You will need a transvaginal ultrasound and an endometrial biopsy which is done by the gynecologists for further evaluation.    It is very very important for you to go to this appointment!!!    Also, please check your sugars 3 times a day before meals. Please be sure to pick up your test strips today.   Please return to the clinic in 2 weeks and bring your glucometer with you so we can change your insulin regimen. Please work on improving your diet as well.

## 2013-09-25 NOTE — Assessment & Plan Note (Signed)
Her father died in December, and she is still having difficulty with his death but states that she is beginning to feel better. She continues to take the Xanax and Cymbalta and does feel that these help her depression and anxiety.  - Refilled the Cymbalta and Xanax today

## 2013-09-25 NOTE — Assessment & Plan Note (Addendum)
Pt with 1 episode of post-menopausal bleeding in November and persistent abdominal/pelivic bloating with intermittent RLQ pain. She was seen in the ED in Nov for RLQ abdominal pain. A CT abd/pelivs was performed and revealed a 2.9cm R ovarty, which is the upper limit of normal in size. She was referred to GYN in Nov and no showed/cancelled all of her appts 2/2 social stressors. With these symptioms, I have been and am still concerned for malignancy, esp ovarian cancer. I have expressed to her the importance of keeping her GYN appt for further evaluation, including transvaginal u/s and endometrial biopsy. We have called [facility] to reschedule the GYN appt for her, but they ask that the pt call back on April 13th to schedule the appt. Mrs. Goodin assures me that she will go to this appt and does appear concerned for her own health.  - GYN appt ASAP

## 2013-09-25 NOTE — Progress Notes (Signed)
Patient ID: ERNEST ORR, female   DOB: 21-Apr-1961, 53 y.o.   MRN: [id number]  Subjective:   Patient ID: KAYDON CREEDON female   DOB: 1961-05-28 53 y.o.   MRN: [id number]  HPI: Ms.Laylah L Runquist is a 53 y.o. F w/ PMH depression/anxiety, cigarette abuse, DM2, HTN presents for a follow up for her DM2, HTN, and complains of continued abdominal bloating and mid epigastric pain.  She frequently cancels or no shows visits 2/2 social issues.   She was seen in the ED in Nov '14 c/o mid epigastric and RLQ abdominal pain radiating to her R flank assoc with N/V. A CT abdomen and pelvis was performed and showed a small left renal calculus and a 2.9 cm right ovary, which is the upper limit of normal. UA negative for infection. She was referred to GI at that time, but unfortunately no showed the appt.   She was also seen by me in Nov and was referred to GYN for a pap smear/pelvic exam as she had experienced post menopausal bleeding in addition to the bloating, but she cancelled and no showed appts with GYN.   She states that her father passed away in 2023-06-04 and she has had a difficult time dealing with his death.   She does have insurance that begins in May and would like to be re-referred to GI and GYN at that time.   Her A1c is 9.9 today, up from 7.8 in Nov '14. She is taking her Lantus 45u qam and 35u qhs and Metformin 1000mg  BID. She is not checking her CBGs as she has run out of test strips.   She states that she has had a cold over the past week and has had some sinus congestion and some SOB. She has been taking Tylenol sinus and using her rescue inhaler PRN and her symptoms are improving.     Past Medical History  Diagnosis Date  . Asthma     No PFT performed  . Diabetes mellitus 2002  . Depression   . Panic attacks     mostly Agaraphobia  . GERD (gastroesophageal reflux disease)   . Helicobacter pylori (H. pylori) infection     s/p triple therapy  . Hyperlipidemia   . Hepatic hemangioma   .  Hypertriglyceridemia    Current Outpatient Prescriptions  Medication Sig Dispense Refill  . albuterol (PROVENTIL HFA;VENTOLIN HFA) 108 (90 BASE) MCG/ACT inhaler Inhale 2 puffs into the lungs every 6 (six) hours as needed for wheezing.  3 Inhaler  4  . ALPRAZolam (XANAX) 0.5 MG tablet Take 1 tablet (0.5 mg total) by mouth 3 (three) times daily as needed for sleep or anxiety.  90 tablet  3  . aspirin 81 MG tablet Take 1 tablet (81 mg total) by mouth daily.  30 tablet  2  . DULoxetine (CYMBALTA) 60 MG capsule Take 1 capsule (60 mg total) by mouth 2 (two) times daily.  60 capsule  11  . fenofibrate (TRICOR) 145 MG tablet Take 1 tablet (145 mg total) by mouth daily.  90 tablet  3  . insulin glargine (LANTUS) 100 UNIT/ML injection Inject 45 in the morning and 35 in the evening .  24 mL  11  . lisinopril (ZESTRIL) 5 MG tablet Take 1 tablet (5 mg total) by mouth daily.  30 tablet  3  . metFORMIN (GLUCOPHAGE) 1000 MG tablet TAKE ONE TABLET BY MOUTH TWICE DAILY WITH MEALS  60 tablet  11  . niacin (NIASPAN)  500 MG CR tablet Take 1 tablet (500 mg total) by mouth 2 (two) times daily.  180 tablet  3  . omega-3 acid ethyl esters (LOVAZA) 1 G capsule Take 2 capsules (2 g total) by mouth 2 (two) times daily.  360 capsule  3  . omeprazole (PRILOSEC) 20 MG capsule Take 1 capsule (20 mg total) by mouth daily.  30 capsule  2  . tiotropium (SPIRIVA HANDIHALER) 18 MCG inhalation capsule Place 1 capsule (18 mcg total) into inhaler and inhale daily.  90 capsule  11  . albuterol (PROVENTIL,VENTOLIN) 90 MCG/ACT inhaler Inhale 1-2 puffs into the lungs every 4 (four) hours as needed for wheezing.  1 each  3  . Fluticasone-Salmeterol (ADVAIR DISKUS) 500-50 MCG/DOSE AEPB Inhale 1 puff into the lungs every 12 (twelve) hours.  2 each  6  . oxyCODONE (ROXICODONE) 5 MG immediate release tablet Take 1 tablet (5 mg total) by mouth every 6 (six) hours as needed.  60 tablet  0  . promethazine (PHENERGAN) 25 MG tablet Take 1 tablet (25  mg total) by mouth every 6 (six) hours as needed for nausea or vomiting.  12 tablet  0   No current facility-administered medications for this visit.   Family History  Problem Relation Age of Onset  . Colon polyps Mother     benign  . Cancer Mother     ovarian cancer  . Cancer Maternal Aunt     breast cancer, ovarian cancer  . Diabetes Maternal Uncle   . Cancer Father     Prostate Cancer    History   Social History  . Marital Status: Married    Spouse Name: N/A    Number of Children: N/A  . Years of Education: N/A   Social History Main Topics  . Smoking status: Current Every Day Smoker -- 1.00 packs/day for 16 years    Types: Cigarettes  . Smokeless tobacco: None  . Alcohol Use: No     Comment: Seldom  . Drug Use: No  . Sexual Activity: None   Other Topics Concern  . None   Social History Narrative   Drinks at least a pot of coffee every day.    Review of Systems: A 12 point ROS was performed; pertinent positives and negatives are noted below  Constitutional: Decreased appetite.  HEENT: +nasal congestion Respiratory: +SOB   Cardiovascular: Denies chest pain or leg swelling.  Gastrointestinal: Denies nausea, vomiting. +abdominal pain with intermittent diarrhea/ constipation, and bloating Genitourinary: Denies dysuria or flank pain. No further vaginal bleeding.  Neurological: Denies dizziness, syncope, weakness, or headaches.  Psychiatric/Behavioral: + depression and anxiety. Denies suicidal ideation.   Objective:  Physical Exam: Filed Vitals:   09/25/13 1346  BP: 136/78  Pulse: 104  Temp: 96.9 F (36.1 C)  TempSrc: Oral  Height: 5\' 2"  (1.575 m)  Weight: 168 lb 1.6 oz (76.25 kg)  SpO2: 93%   Constitutional: Vital signs reviewed.  Patient is a well-developed and well-nourished female in no acute distress and cooperative with exam.   Head: Normocephalic and atraumatic Eyes: PERRL, EOMI, conjunctivae injected. No scleral icterus.  Cardiovascular: RRR, no  MRG, radial pulses symmetric and intact bilaterally. Pulmonary/Chest: Normal respiratory effort, CTAB, no wheezes, rales, or rhonchi Abdominal: Soft.  With mid epigastric tenderness to deep palpation and bilateral lower quadrant point tenderness to deep palpation primarily in the adnexal regions. Abdomen is distended and tympanitic. No fluid wave appreciated. BS normal. No appreciable masses or guarding. Musculoskeletal: No joint  deformities or stiffness, ROM full  Neurological: A&O x3, non focal Skin: Warm, dry and intact.  Psychiatric: Depressed mood with flattened affect, stable/improved from last clinic visit.  Assessment & Plan:   Please refer to Problem List based Assessment and Plan

## 2013-09-25 NOTE — Assessment & Plan Note (Signed)
Lab Results  Component Value Date   HGBA1C 9.9 09/25/2013   HGBA1C 7.8 05/01/2013   HGBA1C 9.9 11/11/2012     Assessment: Diabetes control: poor control (HgbA1C >9%) Progress toward A1C goal:  deteriorated Comments: She endorses taking her Lantus 45u qam and 35u qhs in addition to the Metformin 1000mg  BID. She is not checking her CBGs at home 2/2 to no test strips.   Plan: Medications:  continue current medications Home glucose monitoring: Frequency: no home glucose monitoring Timing:   Instruction/counseling given: reminded to bring blood glucose meter & log to each visit, reminded to bring medications to each visit and discussed diet Educational resources provided: brochure;handout Self management tools provided:   Other plans: She is to pick up the test strips and begin checking her CBGs 3x day before meals. She is to bring her meter to her next clinic appt in roughly 2 weeks, so her insulin regimen can be modified for better control. Diet and exercise were also discussed with Mrs. Moroni, and she will work on eating better. We discussed exercise and she will try to get outside an walk in her neighborhood for about 30 min a day.

## 2013-09-26 NOTE — Progress Notes (Signed)
Case discussed with Dr. Eulas Post at time of visit.  We reviewed the resident's history and exam and pertinent patient test results.  I agree with the assessment, diagnosis, and plan of care documented in the resident's note.  We are concerned about her enlarged ovary associated with pain and bloating as well as her episode of post-menopausal bleeding.  Also of concern is her family history of breat cancer in her mother and maternal aunt and ovarian cancer in her maternal aunt.  We stressed the importance of working these issues up in the [facility].

## 2013-10-10 ENCOUNTER — Observation Stay (HOSPITAL_COMMUNITY)
Admission: AD | Admit: 2013-10-10 | Discharge: 2013-10-11 | Disposition: A | Discharge status: Home / Self Care | Payer: Self-pay | Source: Ambulatory Visit | Attending: Internal Medicine | Admitting: Internal Medicine

## 2013-10-10 ENCOUNTER — Encounter (HOSPITAL_COMMUNITY): Payer: Self-pay | Admitting: General Practice

## 2013-10-10 ENCOUNTER — Encounter: Payer: Self-pay | Admitting: Internal Medicine

## 2013-10-10 ENCOUNTER — Ambulatory Visit (INDEPENDENT_AMBULATORY_CARE_PROVIDER_SITE_OTHER): Payer: Self-pay | Admitting: Internal Medicine

## 2013-10-10 VITALS — BP 129/77 | HR 112 | Temp 98.5°F | Ht 62.0 in | Wt 166.2 lb

## 2013-10-10 DIAGNOSIS — Z7982 Long term (current) use of aspirin: Secondary | ICD-10-CM | POA: Insufficient documentation

## 2013-10-10 DIAGNOSIS — Z9851 Tubal ligation status: Secondary | ICD-10-CM | POA: Insufficient documentation

## 2013-10-10 DIAGNOSIS — IMO0002 Reserved for concepts with insufficient information to code with codable children: Secondary | ICD-10-CM | POA: Insufficient documentation

## 2013-10-10 DIAGNOSIS — N764 Abscess of vulva: Principal | ICD-10-CM | POA: Insufficient documentation

## 2013-10-10 DIAGNOSIS — F419 Anxiety disorder, unspecified: Secondary | ICD-10-CM | POA: Diagnosis present

## 2013-10-10 DIAGNOSIS — L03319 Cellulitis of trunk, unspecified: Secondary | ICD-10-CM

## 2013-10-10 DIAGNOSIS — D45 Polycythemia vera: Secondary | ICD-10-CM | POA: Insufficient documentation

## 2013-10-10 DIAGNOSIS — D751 Secondary polycythemia: Secondary | ICD-10-CM | POA: Diagnosis present

## 2013-10-10 DIAGNOSIS — Z803 Family history of malignant neoplasm of breast: Secondary | ICD-10-CM | POA: Insufficient documentation

## 2013-10-10 DIAGNOSIS — K219 Gastro-esophageal reflux disease without esophagitis: Secondary | ICD-10-CM | POA: Insufficient documentation

## 2013-10-10 DIAGNOSIS — E118 Type 2 diabetes mellitus with unspecified complications: Secondary | ICD-10-CM

## 2013-10-10 DIAGNOSIS — Z794 Long term (current) use of insulin: Secondary | ICD-10-CM | POA: Insufficient documentation

## 2013-10-10 DIAGNOSIS — Z79899 Other long term (current) drug therapy: Secondary | ICD-10-CM | POA: Insufficient documentation

## 2013-10-10 DIAGNOSIS — F411 Generalized anxiety disorder: Secondary | ICD-10-CM | POA: Insufficient documentation

## 2013-10-10 DIAGNOSIS — L02215 Cutaneous abscess of perineum: Secondary | ICD-10-CM | POA: Insufficient documentation

## 2013-10-10 DIAGNOSIS — E781 Pure hyperglyceridemia: Secondary | ICD-10-CM | POA: Diagnosis present

## 2013-10-10 DIAGNOSIS — E119 Type 2 diabetes mellitus without complications: Secondary | ICD-10-CM | POA: Diagnosis present

## 2013-10-10 DIAGNOSIS — J449 Chronic obstructive pulmonary disease, unspecified: Secondary | ICD-10-CM | POA: Diagnosis present

## 2013-10-10 DIAGNOSIS — F32A Depression, unspecified: Secondary | ICD-10-CM | POA: Diagnosis present

## 2013-10-10 DIAGNOSIS — J4489 Other specified chronic obstructive pulmonary disease: Secondary | ICD-10-CM | POA: Insufficient documentation

## 2013-10-10 DIAGNOSIS — R062 Wheezing: Secondary | ICD-10-CM

## 2013-10-10 DIAGNOSIS — J45909 Unspecified asthma, uncomplicated: Secondary | ICD-10-CM

## 2013-10-10 DIAGNOSIS — L02219 Cutaneous abscess of trunk, unspecified: Secondary | ICD-10-CM

## 2013-10-10 DIAGNOSIS — F3289 Other specified depressive episodes: Secondary | ICD-10-CM | POA: Insufficient documentation

## 2013-10-10 DIAGNOSIS — R000 Tachycardia, unspecified: Secondary | ICD-10-CM | POA: Insufficient documentation

## 2013-10-10 DIAGNOSIS — F329 Major depressive disorder, single episode, unspecified: Secondary | ICD-10-CM | POA: Diagnosis present

## 2013-10-10 DIAGNOSIS — E1165 Type 2 diabetes mellitus with hyperglycemia: Secondary | ICD-10-CM

## 2013-10-10 DIAGNOSIS — J441 Chronic obstructive pulmonary disease with (acute) exacerbation: Secondary | ICD-10-CM | POA: Diagnosis present

## 2013-10-10 DIAGNOSIS — F17200 Nicotine dependence, unspecified, uncomplicated: Secondary | ICD-10-CM | POA: Diagnosis present

## 2013-10-10 DIAGNOSIS — Z8041 Family history of malignant neoplasm of ovary: Secondary | ICD-10-CM | POA: Insufficient documentation

## 2013-10-10 DIAGNOSIS — IMO0001 Reserved for inherently not codable concepts without codable children: Secondary | ICD-10-CM

## 2013-10-10 DIAGNOSIS — F341 Dysthymic disorder: Secondary | ICD-10-CM

## 2013-10-10 LAB — COMPLETE METABOLIC PANEL WITH GFR
ALK PHOS: 158 U/L — AB (ref 39–117)
ALT: 11 U/L (ref 0–35)
AST: 12 U/L (ref 0–37)
Albumin: 3.7 g/dL (ref 3.5–5.2)
BILIRUBIN TOTAL: 0.5 mg/dL (ref 0.3–1.2)
BUN: 8 mg/dL (ref 6–23)
CO2: 21 mEq/L (ref 19–32)
CREATININE: 0.45 mg/dL — AB (ref 0.50–1.10)
Calcium: 10.1 mg/dL (ref 8.4–10.5)
Chloride: 93 mEq/L — ABNORMAL LOW (ref 96–112)
GFR, Est African American: >89 mL/min
GFR, Est Non African American: >89 mL/min
Glucose, Bld: 299 mg/dL — ABNORMAL HIGH (ref 70–99)
Potassium: 4.5 mEq/L (ref 3.5–5.3)
Sodium: 133 mEq/L — ABNORMAL LOW (ref 135–145)
Total Protein: 7.2 g/dL (ref 6.0–8.3)

## 2013-10-10 LAB — CBC WITH DIFFERENTIAL/PLATELET
BASOS ABS: 0 10*3/uL (ref 0.0–0.1)
BASOS PCT: 0 % (ref 0–1)
EOS ABS: 0.5 10*3/uL (ref 0.0–0.7)
EOS PCT: 3 % (ref 0–5)
HCT: 42.4 % (ref 36.0–46.0)
Hemoglobin: 15.2 g/dL — ABNORMAL HIGH (ref 12.0–15.0)
Lymphocytes Relative: 16 % (ref 12–46)
Lymphs Abs: 2.9 10*3/uL (ref 0.7–4.0)
MCH: 31.9 pg (ref 26.0–34.0)
MCHC: 35.8 g/dL (ref 30.0–36.0)
MCV: 88.9 fL (ref 78.0–100.0)
MONO ABS: 1.1 10*3/uL — AB (ref 0.1–1.0)
Monocytes Relative: 6 % (ref 3–12)
Neutro Abs: 13.7 10*3/uL — ABNORMAL HIGH (ref 1.7–7.7)
Neutrophils Relative %: 75 % (ref 43–77)
Platelets: 436 10*3/uL — ABNORMAL HIGH (ref 150–400)
RBC: 4.77 MIL/uL (ref 3.87–5.11)
RDW: 13 % (ref 11.5–15.5)
WBC: 18.2 10*3/uL — ABNORMAL HIGH (ref 4.0–10.5)

## 2013-10-10 LAB — GLUCOSE, CAPILLARY
Glucose-Capillary: 293 mg/dL — ABNORMAL HIGH (ref 70–99)
Glucose-Capillary: 184 mg/dL — ABNORMAL HIGH (ref 70–99)

## 2013-10-10 MED ORDER — ALPRAZOLAM 0.5 MG PO TABS: 0.5000 mg | ORAL_TABLET | Freq: Three times a day (TID) | ORAL | Status: DC | PRN

## 2013-10-10 MED ORDER — ENOXAPARIN SODIUM 40 MG/0.4ML ~~LOC~~ SOLN
40.0000 mg | SUBCUTANEOUS | Status: DC
  Administered 2013-10-10: 40 mg via SUBCUTANEOUS
  Filled 2013-10-10 (×2): qty 0.4

## 2013-10-10 MED ORDER — ALBUTEROL SULFATE (2.5 MG/3ML) 0.083% IN NEBU: 3.0000 mL | INHALATION_SOLUTION | Freq: Four times a day (QID) | RESPIRATORY_TRACT | Status: DC | PRN

## 2013-10-10 MED ORDER — OXYCODONE HCL 5 MG PO TABS
5.0000 mg | ORAL_TABLET | Freq: Four times a day (QID) | ORAL | Status: DC | PRN
  Administered 2013-10-10 – 2013-10-11 (×3): 5 mg via ORAL
  Filled 2013-10-10 (×3): qty 1

## 2013-10-10 MED ORDER — ASPIRIN EC 81 MG PO TBEC
81.0000 mg | DELAYED_RELEASE_TABLET | Freq: Every day | ORAL | Status: DC
  Administered 2013-10-11: 81 mg via ORAL
  Filled 2013-10-10: qty 1

## 2013-10-10 MED ORDER — OMEGA-3-ACID ETHYL ESTERS 1 G PO CAPS
2.0000 g | ORAL_CAPSULE | Freq: Two times a day (BID) | ORAL | Status: DC
  Administered 2013-10-10 – 2013-10-11 (×2): 2 g via ORAL
  Filled 2013-10-10 (×3): qty 2

## 2013-10-10 MED ORDER — INSULIN GLARGINE 100 UNIT/ML ~~LOC~~ SOLN
35.0000 [IU] | Freq: Every day | SUBCUTANEOUS | Status: DC
  Administered 2013-10-10: 35 [IU] via SUBCUTANEOUS
  Filled 2013-10-10 (×2): qty 0.35

## 2013-10-10 MED ORDER — LISINOPRIL 5 MG PO TABS
5.0000 mg | ORAL_TABLET | Freq: Every day | ORAL | Status: DC
  Administered 2013-10-10 – 2013-10-11 (×2): 5 mg via ORAL
  Filled 2013-10-10 (×2): qty 1

## 2013-10-10 MED ORDER — DULOXETINE HCL 60 MG PO CPEP
60.0000 mg | ORAL_CAPSULE | Freq: Two times a day (BID) | ORAL | Status: DC
  Administered 2013-10-10 – 2013-10-11 (×2): 60 mg via ORAL
  Filled 2013-10-10 (×3): qty 1

## 2013-10-10 MED ORDER — TIOTROPIUM BROMIDE MONOHYDRATE 18 MCG IN CAPS
18.0000 ug | ORAL_CAPSULE | Freq: Every day | RESPIRATORY_TRACT | Status: DC
  Filled 2013-10-10: qty 5

## 2013-10-10 MED ORDER — DOXYCYCLINE HYCLATE 100 MG PO TABS
100.0000 mg | ORAL_TABLET | Freq: Two times a day (BID) | ORAL | Status: DC
  Administered 2013-10-10 – 2013-10-11 (×2): 100 mg via ORAL
  Filled 2013-10-10 (×3): qty 1

## 2013-10-10 MED ORDER — INSULIN GLARGINE 100 UNIT/ML ~~LOC~~ SOLN
45.0000 [IU] | Freq: Every morning | SUBCUTANEOUS | Status: DC
  Administered 2013-10-11: 45 [IU] via SUBCUTANEOUS
  Filled 2013-10-10: qty 0.45

## 2013-10-10 MED ORDER — MORPHINE SULFATE 2 MG/ML IJ SOLN
1.0000 mg | Freq: Once | INTRAMUSCULAR | Status: AC
  Administered 2013-10-10: 1 mg via INTRAVENOUS

## 2013-10-10 MED ORDER — PROMETHAZINE HCL 25 MG PO TABS: 25.0000 mg | ORAL_TABLET | Freq: Four times a day (QID) | ORAL | Status: DC | PRN

## 2013-10-10 MED ORDER — NIACIN ER (ANTIHYPERLIPIDEMIC) 500 MG PO TBCR
500.0000 mg | EXTENDED_RELEASE_TABLET | Freq: Two times a day (BID) | ORAL | Status: DC
  Administered 2013-10-10 – 2013-10-11 (×2): 500 mg via ORAL
  Filled 2013-10-10 (×3): qty 1

## 2013-10-10 MED ORDER — MORPHINE SULFATE 2 MG/ML IJ SOLN
INTRAMUSCULAR | Status: AC
  Filled 2013-10-10: qty 1

## 2013-10-10 MED ORDER — PANTOPRAZOLE SODIUM 40 MG PO TBEC
40.0000 mg | DELAYED_RELEASE_TABLET | Freq: Every day | ORAL | Status: DC
  Administered 2013-10-10: 40 mg via ORAL
  Filled 2013-10-10: qty 1

## 2013-10-10 MED ORDER — INSULIN ASPART 100 UNIT/ML ~~LOC~~ SOLN
0.0000 [IU] | Freq: Three times a day (TID) | SUBCUTANEOUS | Status: DC
  Administered 2013-10-10 – 2013-10-11 (×2): 8 [IU] via SUBCUTANEOUS

## 2013-10-10 MED ORDER — FENOFIBRATE 160 MG PO TABS
160.0000 mg | ORAL_TABLET | Freq: Every day | ORAL | Status: DC
  Administered 2013-10-10 – 2013-10-11 (×2): 160 mg via ORAL
  Filled 2013-10-10 (×2): qty 1

## 2013-10-10 NOTE — H&P (Signed)
Date: 10/10/2013               Patient Name:  Vanessa [name] MRN: [id number]  DOB: 14-Nov-1960 Age / Sex: 53 y.o., female   PCP: Otho Bellows, MD         Medical Service: Internal Medicine Teaching Service         Attending Physician: Dr. Dominic Pea, DO    First Contact: Dr. Heber Minier Pager: [phone]  Second Contact: Dr. Eula Fried Pager: [phone]       After Hours (After 5p/  First Contact Pager: [phone]  weekends / holidays): Second Contact Pager: [phone]   Chief Complaint: Abscess  History of Present Illness: Vanessa [name] is a pleasant 53 year old caucasian female with PMH of Asthma, poorly controlled DM2, Hypertriglyceridemia, GERD,  depression, and anxiety admitted from [facility] for perineal abscess x 1 week.  She claims the lesion started as a small boil last week and has rapidly increased in size and pain since then.  She has had similar "boils" on her breasts in the past but none in this region.  She tried warm soaks at home for the past 2 days but says the last bath she took made the pain worse. She denies any fever, chills, abdominal pain, dysuria.  She is a chronic smoker, ~1ppd, and did not take any of her medications today including her insulin.  She also denies eating today but has no nausea, vomiting, or diarrhea.   Meds: Current Facility-Administered Medications  Medication Dose Route Frequency Provider Last Rate Last Dose  . albuterol (PROVENTIL) (2.5 MG/3ML) 0.083% nebulizer solution 3 mL  3 mL Inhalation Q6H PRN Jerene Pitch, MD      . ALPRAZolam Duanne Moron) tablet 0.5 mg  0.5 mg Oral TID PRN Jerene Pitch, MD      . aspirin EC tablet 81 mg  81 mg Oral Daily Jerene Pitch, MD      . doxycycline (VIBRA-TABS) tablet 100 mg  100 mg Oral Q12H Jerene Pitch, MD      . DULoxetine (CYMBALTA) DR capsule 60 mg  60 mg Oral BID Jerene Pitch, MD      . fenofibrate tablet 160 mg  160 mg Oral Daily Jerene Pitch, MD      . insulin aspart (novoLOG) injection 0-15 Units  0-15 Units  Subcutaneous TID WC Jerene Pitch, MD   8 Units at 10/10/13 1836  . insulin glargine (LANTUS) injection 35 Units  35 Units Subcutaneous QHS Jerene Pitch, MD      . Derrill Memo ON 10/11/2013] insulin glargine (LANTUS) injection 45 Units  45 Units Subcutaneous q morning - 10a Jerene Pitch, MD      . lisinopril (PRINIVIL,ZESTRIL) tablet 5 mg  5 mg Oral Daily Jerene Pitch, MD      . niacin (NIASPAN) CR tablet 500 mg  500 mg Oral BID Jerene Pitch, MD      . omega-3 acid ethyl esters (LOVAZA) capsule 2 g  2 g Oral BID Jerene Pitch, MD      . oxyCODONE (Oxy IR/ROXICODONE) immediate release tablet 5 mg  5 mg Oral Q6H PRN Jerene Pitch, MD      . pantoprazole (PROTONIX) EC tablet 40 mg  40 mg Oral Daily Jerene Pitch, MD      . promethazine (PHENERGAN) tablet 25 mg  25 mg Oral Q6H PRN Jerene Pitch, MD      . tiotropium (SPIRIVA) inhalation capsule 18 mcg  18 mcg Inhalation Daily Jerene Pitch, MD  Allergies: Allergies as of 10/10/2013 - Review Complete 10/10/2013  Allergen Reaction Noted  . Rosiglitazone maleate  06/14/2006   Past Medical History  Diagnosis Date  . Asthma     No PFT performed  . Diabetes mellitus 2002  . Depression   . Panic attacks     mostly Agaraphobia  . GERD (gastroesophageal reflux disease)   . Helicobacter pylori (H. pylori) infection     s/p triple therapy  . Hyperlipidemia   . Hepatic hemangioma   . Hypertriglyceridemia    Past Surgical History  Procedure Laterality Date  . Cesarean section      with 3 children  . Tubal ligation    . Ovarian cyst removal     Family History  Problem Relation Age of Onset  . Colon polyps Mother     benign  . Cancer Mother     ovarian cancer  . Cancer Maternal Aunt     breast cancer, ovarian cancer  . Diabetes Maternal Uncle   . Cancer Father     Prostate Cancer    History   Social History  . Marital Status: Married    Spouse Name: N/A    Number of Children: N/A  . Years of Education: N/A    Occupational History  . Not on file.   Social History Main Topics  . Smoking status: Current Every Day Smoker -- 1.00 packs/day for 16 years    Types: Cigarettes  . Smokeless tobacco: Never Used  . Alcohol Use: No     Comment: Seldom  . Drug Use: No  . Sexual Activity: Not on file   Other Topics Concern  . Not on file   Social History Narrative   Drinks at least a pot of coffee every day.    Review of Systems:  Constitutional:  Denies fever, chills, diaphoresis, appetite change and fatigue.   HEENT:  Denies congestion, sore throat  Respiratory:  Denies SOB and cough.  Cardiovascular:  Denies palpitations and leg swelling.   Gastrointestinal:  Chronic cramping abdominal pain. Denies nausea, vomiting, abdominal pain, diarrhea  Genitourinary:  Denies dysuria, urgency, frequency  Musculoskeletal:  Denies myalgias, back pain, joint swelling, arthralgias and gait problem.   Skin:  R vulvar/perineal abscess  Neurological:  Denies dizziness, seizures   Physical Exam: Blood pressure 147/64, pulse 132, temperature 98.8 F (37.1 C), temperature source Oral, resp. rate 18, height 5\' 2"  (1.575 m), weight 166 lb 3.2 oz (75.388 kg), SpO2 93.00%. Vitals reviewed. General: resting in bed, painful distress due to abscess on movement and palpation HEENT:EOMI Cardiac: Tachycardia Pulm: clear to auscultation bilaterally, no wheezes, rales, or rhonchi Abd: soft, distended, tenderness to deep palpation initially, BS present, umbilical hernia GYN: ~3IR indurated, fluctuant, erythematous linear abscess, tender to palpation, with no visible opening and no drainage, warm to touch Lymph node: +right inguinal lymphadenopathy, tender, mobile Ext: warm and well perfused, no pedal edema, +2dp b/l Neuro: alert and oriented X3, sensation grossly intact  Lab results: Basic Metabolic Panel:  Recent Labs  10/10/13 1200  NA 133*  K 4.5  CL 93*  CO2 21  GLUCOSE 299*  BUN 8  CREATININE 0.45*   CALCIUM 10.1   Liver Function Tests:  Recent Labs  10/10/13 1200  AST 12  ALT 11  ALKPHOS 158*  BILITOT 0.5  PROT 7.2  ALBUMIN 3.7   CBC:  Recent Labs  10/10/13 1200  WBC 18.2*  NEUTROABS 13.7*  HGB 15.2*  HCT 42.4  MCV  88.9  PLT 436*   CBG:  Recent Labs  10/10/13 1810  GLUCAP 293*   Hemoglobin A1C: 4/2/215: 9.9  Urine Drug Screen: Drugs of Abuse     Component Value Date/Time   LABOPIA NEG 04/15/2012 1538   COCAINSCRNUR NEG 04/15/2012 1538   LABBENZ PPS 04/15/2012 1538   LABBARB NEG 04/15/2012 1538    Assessment & Plan by Problem: Principal Problem:   Perineal abscess Active Problems:   Tobacco dependence   Type II or unspecified type diabetes mellitus with unspecified complication, uncontrolled   HYPERTRIGLYCERIDEMIA   ANXIETY DEPRESSION   Polycythemia   COPD (chronic obstructive pulmonary disease)  Ms. Laroque is a 53 year old female with uncontrolled DM2 admitted for R perineal abscess from PCP office.   R perineal abscess--s/p I&D by Dr. Heber [place] and Dr. Murlean Caller with serosanguinous purulent drainage~100-150cc. Wound was packed and dry dressing applied. No fever or chills. Leukocytosis of 18.2 but appears to have chronic leukocytosis, last one was 11.6 in November 2014.  -admit to med/surg -pain control--home dose oxycodone 5mg  q6h prn -given large drainage and uncontrolled diabetes, will start doxycycline 100mg  po bid -dry dressing changes bid -will reassess wound in AM  Uncontrolled DM2--HbA1C 9.9. Did not take any insulin today but says also did not eat anything today. Glucose 293 today. AG 19 but bicarb 21.  -given evening dose of Lantus 35 units--home -AM Lantus home dose 45 units -SSI moderate -CBG monitoring -AM BMET -Continue lisinopril  Hypertriglyceridemia--last lipid panel 12/2012: Chol 492, TG 4605, HDL 26. -AM lipid panel -continue home medications: fenofibrate, niacin, lovaza  Chronic tobacco dependence with hx of COPD--smokes up  to 1 pack per day.  -counseling for cessation provided, not ready to quit at this time -continue albuterol inhaler, spiriva  Anxiety and depression--appears stable at this time.  -continue cymbalta home dose -xanax prn  Polycythemia--Hb 15.2 on admission, baseline appears 15-16. Possibly reactive secondary to chronic tobacco dependence. -outpatient work up will be required  DVT Ppx: Lovenox Diet: Carb modified Dispo: D/C home tomorrow  The patient does have a current PCP Otho Bellows, MD) and does need an [facility] hospital follow-up appointment after discharge.  The patient does not have transportation limitations that hinder transportation to clinic appointments.  Signed: Jerene Pitch, MD 10/10/2013, 8:28 PM

## 2013-10-10 NOTE — Progress Notes (Signed)
Admission called for medical bed; pt will be admitted from home. Telephone#'s given to Adm - [phone] and [phone]; will call pt when bed is ready. Pt and Dr Hayes Ludwig awared.

## 2013-10-10 NOTE — Progress Notes (Signed)
   Subjective:    Patient ID: Vanessa [name], female    DOB: 04/13/1961, 53 y.o.   MRN: [id number]  HPI Vanessa [name] is a 53 yr old woman with PMH significant for DM2, COPD, anxiety, depression, who presents for evaluation of of boil in her buttocks. She states that she felt a "pimple" like an ingrown hair in her right groin area one week ago. She tried warm baths but the area kept getting bigger and more painful to the point that she can no longer sit down. She felt a knot in her right upper groin area yesterday. She could not sleep last night due to severe pain in the affected area.  She denies fever/chills, N/V/D, or constipation.  She has been taking all her medications, including her insulin until yesterday. She states that her blood sugars have been less than 180.  Of note, she has lost another 3 lbs since her last visit this month. She has appointment with GYN on May 4th for evaluation of ovarian/endrometrial cancer. She notes that she has not had recurrent vaginal bleeding.  Her new insurance starts May 1st and she will wait until then to schedule her colonoscopy.    Review of Systems  Constitutional: Negative for fever, chills, diaphoresis, activity change, appetite change and fatigue.  Respiratory: Negative for cough and shortness of breath.   Cardiovascular: Negative for chest pain, palpitations and leg swelling.  Gastrointestinal: Negative for nausea, vomiting, abdominal pain, diarrhea and constipation.  Genitourinary: Negative for dysuria, urgency, frequency, hematuria, vaginal bleeding, vaginal discharge, genital sores, vaginal pain and pelvic pain.       Right vulva and groin pain and swelling.   Neurological: Negative for dizziness, weakness and headaches.  Hematological: Positive for adenopathy.       Right groin swollen node  Psychiatric/Behavioral: Negative for behavioral problems and agitation.       Objective:   Physical Exam  Nursing note and vitals  reviewed. Constitutional: She is oriented to person, place, and time. She appears well-developed and well-nourished. She appears distressed.  Mild distress due to pain, pacing in the room  Cardiovascular:  Mild tachycardia  Pulmonary/Chest: Effort normal. No respiratory distress.  Abdominal: Soft. There is no tenderness.  Genitourinary: No vaginal discharge found.  She has right vulva and right labium majus edema and erythema ~10cm in diameter with ~4cm of enduration, exquisitly tender to touch. The tenderness to touch extends to the right mons pubis and right buttocks. There is no edema/erythema of the introitus or vagina, no edema or erythema of the anus.  She has right groin lymphadenopathy with a shotty ~1cm lymph node felt at the right upper groin area.       Neurological: She is alert and oriented to person, place, and time.  Skin: Skin is warm and dry. She is not diaphoretic.  Psychiatric: She has a normal mood and affect. Her behavior is normal.          Assessment & Plan:

## 2013-10-10 NOTE — Procedures (Signed)
Incision and Drainage Procedure Note  Preformed By: Joni Reining, DO Supervised by: Dr. Dominic Pea Pre-operative Diagnosis: Perineal Abscess  Post-operative Diagnosis: same  Indications: Flocculate and painful  Anesthesia: 2% plain lidocaine  Procedure Details  The procedure, risks and complications have been discussed in detail (including, but not limited to infection, bleeding) with the patient, and the patient has signed consent to the procedure.  The skin was sterilely prepped and draped over the affected area in the usual fashion. After adequate local anesthesia, I&D with a #11 blade was performed on the right labia majoria. Purulent drainage: present The patient was observed until stable. The wound was packed with moistened gauze. Sterile dressings were applied.  Findings: Purulent drainage.  EBL: 100 cc's of serosanguinous fluid  Drains: none  Condition: Tolerated procedure well   Complications: pain., resolved after procedure completed.

## 2013-10-10 NOTE — Progress Notes (Signed)
Adm. Into 6N1 at 1500.  Dr Murlean Caller notified, and then Dr. Eula Fried per his request.  She will be up to see pt and enter adm. Orders.

## 2013-10-10 NOTE — Assessment & Plan Note (Signed)
She has a large painful abscess involving the right labium amjus, mons pubis, and has lymphadenopathy. I do not see evidence for Fornier's gangrene but given the rapid growth of this abscess she should get further imaging (CT w contrast v. MRI) for further evaluation. She will also need I&D with surgical consult and empiric antibiotic tx.  Discussed case with Dr. Dareen Piano.  Pt to be admitted for further evaluation and treatment.  Pt agrees with this plan.  Ordered STAT blood cultures, CBC with diff, CMP

## 2013-10-11 LAB — CBC WITH DIFFERENTIAL/PLATELET
Basophils Absolute: 0.1 10*3/uL (ref 0.0–0.1)
Basophils Relative: 1 % (ref 0–1)
EOS ABS: 0.7 10*3/uL (ref 0.0–0.7)
Eosinophils Relative: 5 % (ref 0–5)
HCT: 42.6 % (ref 36.0–46.0)
HEMOGLOBIN: 14.9 g/dL (ref 12.0–15.0)
Lymphocytes Relative: 26 % (ref 12–46)
Lymphs Abs: 3.6 10*3/uL (ref 0.7–4.0)
MCH: 31.5 pg (ref 26.0–34.0)
MCHC: 35 g/dL (ref 30.0–36.0)
MCV: 90.1 fL (ref 78.0–100.0)
MONOS PCT: 5 % (ref 3–12)
Monocytes Absolute: 0.7 10*3/uL (ref 0.1–1.0)
Neutro Abs: 8.7 10*3/uL — ABNORMAL HIGH (ref 1.7–7.7)
Neutrophils Relative %: 63 % (ref 43–77)
Platelets: 407 10*3/uL — ABNORMAL HIGH (ref 150–400)
RBC: 4.73 MIL/uL (ref 3.87–5.11)
RDW: 13.3 % (ref 11.5–15.5)
WBC: 13.7 10*3/uL — ABNORMAL HIGH (ref 4.0–10.5)

## 2013-10-11 LAB — BASIC METABOLIC PANEL
BUN: 8 mg/dL (ref 6–23)
CALCIUM: 10 mg/dL (ref 8.4–10.5)
CO2: 23 mEq/L (ref 19–32)
Chloride: 99 mEq/L (ref 96–112)
Creatinine, Ser: 0.41 mg/dL — ABNORMAL LOW (ref 0.50–1.10)
GFR calc Af Amer: >90 mL/min (ref >90)
GFR calc non Af Amer: >90 mL/min (ref >90)
GLUCOSE: 248 mg/dL — AB (ref 70–99)
Potassium: 4.4 mEq/L (ref 3.7–5.3)
SODIUM: 135 meq/L — AB (ref 137–147)

## 2013-10-11 LAB — LIPID PANEL
Cholesterol: 266 mg/dL — ABNORMAL HIGH (ref 0–200)
LDL Cholesterol: UNDETERMINED mg/dL (ref 0–99)
TRIGLYCERIDES: 1419 mg/dL — AB (ref <150)
VLDL: UNDETERMINED mg/dL (ref 0–40)

## 2013-10-11 LAB — GLUCOSE, CAPILLARY: Glucose-Capillary: 256 mg/dL — ABNORMAL HIGH (ref 70–99)

## 2013-10-11 MED ORDER — FLUTICASONE-SALMETEROL 500-50 MCG/DOSE IN AEPB: 1.0000 | INHALATION_SPRAY | Freq: Two times a day (BID) | RESPIRATORY_TRACT | Status: DC

## 2013-10-11 MED ORDER — DOXYCYCLINE HYCLATE 100 MG PO TABS: 100.0000 mg | ORAL_TABLET | Freq: Two times a day (BID) | ORAL | Status: AC

## 2013-10-11 MED ORDER — ALBUTEROL 90 MCG/ACT IN AERS: 1.0000 | INHALATION_SPRAY | RESPIRATORY_TRACT | Status: DC | PRN

## 2013-10-11 NOTE — Progress Notes (Signed)
Subjective: Reports she is feeling much better, still some tenderness at I&D site, reports some drainage if she sits up and puts pressure on the area.  Afebrile overnight. Objective: Vital signs in last 24 hours: Filed Vitals:   10/10/13 1513 10/10/13 2159 10/11/13 0606  BP: 147/64 140/75 109/64  Pulse: 132 107 97  Temp: 98.8 F (37.1 C) 98.4 F (36.9 C) 98.5 F (36.9 C)  TempSrc: Oral Oral Oral  Resp: 18 18 18   Height: 5\' 2"  (1.575 m)    Weight: 166 lb 3.2 oz (75.388 kg)    SpO2: 93% 91% 92%   Weight change:   Intake/Output Summary (Last 24 hours) at 10/11/13 0840 Last data filed at 10/10/13 1719  Gross per 24 hour  Intake      0 ml  Output    500 ml  Net   -500 ml   General: resting in bed, Oriented GYN: 2 small incisions with packing, 3cm pocket of induration, no active drainage.   Lab Results: Basic Metabolic Panel:  Recent Labs Lab 10/10/13 1200 10/11/13 0535  NA 133* 135*  K 4.5 4.4  CL 93* 99  CO2 21 23  GLUCOSE 299* 248*  BUN 8 8  CREATININE 0.45* 0.41*  CALCIUM 10.1 10.0   Liver Function Tests:  Recent Labs Lab 10/10/13 1200  AST 12  ALT 11  ALKPHOS 158*  BILITOT 0.5  PROT 7.2  ALBUMIN 3.7   CBC:  Recent Labs Lab 10/10/13 1200 10/11/13 0535  WBC 18.2* 13.7*  NEUTROABS 13.7* 8.7*  HGB 15.2* 14.9  HCT 42.4 42.6  MCV 88.9 90.1  PLT 436* 407*   CBG:  Recent Labs Lab 10/10/13 1810 10/10/13 2127 10/11/13 0712  GLUCAP 293* 184* 256*   Hemoglobin A1C: No results found for this basename: HGBA1C,  in the last 168 hours Fasting Lipid Panel:  Recent Labs Lab 10/11/13 0535  CHOL 266*  HDL NOT REPORTED DUE TO HIGH TRIGLYCERIDES  LDLCALC UNABLE TO CALCULATE IF TRIGLYCERIDE OVER 400 mg/dL  TRIG 1419*  CHOLHDL NOT REPORTED DUE TO HIGH TRIGLYCERIDES   Thyroid Function Tests: No results found for this basename: TSH, T4TOTAL, FREET4, T3FREE, THYROIDAB,  in the last 168 hours Coagulation: No results found for this basename:  LABPROT, INR,  in the last 168 hours Anemia Panel: No results found for this basename: VITAMINB12, FOLATE, FERRITIN, TIBC, IRON, RETICCTPCT,  in the last 168 hours Urine Drug Screen: Drugs of Abuse     Component Value Date/Time   LABOPIA NEG 04/15/2012 1538   COCAINSCRNUR NEG 04/15/2012 1538   LABBENZ PPS 04/15/2012 1538   LABBARB NEG 04/15/2012 1538     Micro Results: No results found for this or any previous visit (from the past 240 hour(s)). Studies/Results: No results found. Medications: I have reviewed the patient's current medications. Scheduled Meds: . aspirin EC  81 mg Oral Daily  . doxycycline  100 mg Oral Q12H  . DULoxetine  60 mg Oral BID  . enoxaparin (LOVENOX) injection  40 mg Subcutaneous Q24H  . fenofibrate  160 mg Oral Daily  . insulin aspart  0-15 Units Subcutaneous TID WC  . insulin glargine  35 Units Subcutaneous QHS  . insulin glargine  45 Units Subcutaneous q morning - 10a  . lisinopril  5 mg Oral Daily  . niacin  500 mg Oral BID  . omega-3 acid ethyl esters  2 g Oral BID  . pantoprazole  40 mg Oral Daily  . tiotropium  18  mcg Inhalation Daily   Continuous Infusions:  PRN Meds:.albuterol, ALPRAZolam, oxyCODONE, promethazine Assessment/Plan:   Perineal abscess - Doing well s/p I&D, will have patient discharged with 7 day course of doxycycline. - Follow up on Tuesday in clinic for packing change.    Type II or unspecified type diabetes mellitus with unspecified complication, uncontrolled - Remains hyperglycemic overnight in 200s - Close follow up in clinic for further DM titration of insulin.    HYPERTRIGLYCERIDEMIA -Triglycerides 1419, will address at clinic followup -Continue fenofibrate     ANXIETY DEPRESSION -continue home medications cymbalta and xanax    Polycythemia -At hospital follow up patient can be referred to heme clinic with dr. Beryle Beams    COPD (chronic obstructive pulmonary disease)  -Sprivia -Albuterol PRN  Dispo:  Discharge home today will follow up in clinic.  The patient does have a current PCP Otho Bellows, MD) and does need an [facility] hospital follow-up appointment after discharge.  The patient does not have transportation limitations that hinder transportation to clinic appointments.  .Services Needed at time of discharge: Y = Yes, Blank = No PT:   OT:   RN:   Equipment:   Other:     LOS: 1 day   Joni Reining, DO 10/11/2013, 8:40 AM

## 2013-10-11 NOTE — Discharge Summary (Signed)
Name: Vanessa [name] MRN: [id number] DOB: 1960-08-31 53 y.o. PCP: Otho Bellows, MD  Date of Admission: 10/10/2013  2:49 PM Date of Discharge: 10/11/2013 Attending Physician: Dominic Pea, DO  Discharge Diagnosis: Principal Problem:   Perineal abscess Active Problems:   Type II or unspecified type diabetes mellitus with unspecified complication, uncontrolled   HYPERTRIGLYCERIDEMIA   ANXIETY DEPRESSION   Tobacco dependence   Polycythemia   COPD (chronic obstructive pulmonary disease)  Discharge Medications:   Medication List         acetaminophen 500 MG tablet  Commonly known as:  TYLENOL  Take 1,000 mg by mouth every 6 (six) hours as needed for mild pain.     albuterol 90 MCG/ACT inhaler  Commonly known as:  PROVENTIL,VENTOLIN  Inhale 1-2 puffs into the lungs every 4 (four) hours as needed for wheezing.     ALPRAZolam 0.5 MG tablet  Commonly known as:  XANAX  Take 1 tablet (0.5 mg total) by mouth 3 (three) times daily as needed for sleep or anxiety.     aspirin 81 MG tablet  Take 1 tablet (81 mg total) by mouth daily.     diphenhydrAMINE 25 mg capsule  Commonly known as:  BENADRYL  Take 50 mg by mouth every 6 (six) hours as needed for allergies.     doxycycline 100 MG tablet  Commonly known as:  VIBRA-TABS  Take 1 tablet (100 mg total) by mouth 2 (two) times daily.     DULoxetine 60 MG capsule  Commonly known as:  CYMBALTA  Take 1 capsule (60 mg total) by mouth 2 (two) times daily.     fenofibrate 145 MG tablet  Commonly known as:  TRICOR  Take 1 tablet (145 mg total) by mouth daily.     Fluticasone-Salmeterol 500-50 MCG/DOSE Aepb  Commonly known as:  ADVAIR DISKUS  Inhale 1 puff into the lungs every 12 (twelve) hours.     GAS-X EXTRA STRENGTH PO  Take 1 tablet by mouth 3 (three) times daily as needed (gas).     ibuprofen 200 MG tablet  Commonly known as:  ADVIL,MOTRIN  Take 600 mg by mouth every 6 (six) hours as needed for moderate pain.     insulin glargine 100 UNIT/ML injection  Commonly known as:  LANTUS  Inject 45 in the morning and 35 in the evening .     lisinopril 2.5 MG tablet  Commonly known as:  PRINIVIL,ZESTRIL  Take 5 mg by mouth daily.     metFORMIN 1000 MG tablet  Commonly known as:  GLUCOPHAGE  Take 1,000 mg by mouth 2 (two) times daily with a meal.     niacin 500 MG CR tablet  Commonly known as:  NIASPAN  Take 1 tablet (500 mg total) by mouth 2 (two) times daily.     omega-3 acid ethyl esters 1 G capsule  Commonly known as:  LOVAZA  Take 2 capsules (2 g total) by mouth 2 (two) times daily.     omeprazole 20 MG capsule  Commonly known as:  PRILOSEC  Take 1 capsule (20 mg total) by mouth daily.     promethazine 25 MG tablet  Commonly known as:  PHENERGAN  Take 1 tablet (25 mg total) by mouth every 6 (six) hours as needed for nausea or vomiting.     tiotropium 18 MCG inhalation capsule  Commonly known as:  SPIRIVA HANDIHALER  Place 1 capsule (18 mcg total) into inhaler and inhale daily.  Disposition and follow-up:   VanessaScotlynn L [name] was discharged from [facility] in Stable condition.  At the hospital follow up visit please address:  1.  Wound recheck, will likely need I&D (2 sites) packing replaced. (10/14/13)  2. Diabetes control.  Patient has follow up with PCP on 10/16/13.  3.  Patient has been referred to Hematology in the past for polycythemia workup but not make appointments, consider referral to Dr. Azucena Freed hematology clinic.  4.  Labs / imaging needed at time of follow-up: None  5.  Pending labs/ test needing follow-up: Blood cultures (10/10/13)  Follow-up Appointments:   Discharge Instructions:      Future Appointments Provider Department Dept Phone   10/16/2013 2:15 PM Otho Bellows, MD [place] [phone]   10/27/2013 1:45 PM Donnamae Jude, MD [facility] [phone]      Consultations:    Procedures  Performed:  No results found.  Admission HPI: Vanessa [name] is a pleasant 53 year old caucasian female with PMH of Asthma, poorly controlled DM2, Hypertriglyceridemia, GERD, depression, and anxiety admitted from [facility] for perineal abscess x 1 week. She claims the lesion started as a small boil last week and has rapidly increased in size and pain since then. She has had similar "boils" on her breasts in the past but none in this region. She tried warm soaks at home for the past 2 days but says the last bath she took made the pain worse. She denies any fever, chills, abdominal pain, dysuria. She is a chronic smoker, ~1ppd, and did not take any of her medications today including her insulin. She also denies eating today but has no nausea, vomiting, or diarrhea.   Hospital Course by problem list: R perineal abscess--s/p I&D on 10/10/13 with serosanguinous purulent drainage~100-150cc. 2 separate incisions were made and wound was packed and dry dressing applied. No fever or chills. Leukocytosis of 18.2 but appears to have chronic leukocytosis, last one was 11.6 in November 2014. No other SIRS criteria. Blood cultures were obtained and have had no growth to date. Given her uncontrolled DM she was started on Doxycyline 100mg  BID to complete a 7 day dose.  She was instructed to apply dry dressing changes bid.  Uncontrolled DM2--HbA1C 9.9. Patient presented hyperglycemic without evidence of ketosis, she was treated with her home insulin regimen and a SSI regimen.  She will have close follow up in the outpatient clinic for further management of her diabetes.  Hypertriglyceridemia--last lipid panel 12/2012: Chol 492, TG 4605, HDL 26.  A fasting lipid panel was repeated which showed some improvement in her hypertriglyceridemia.  She will follow up with her PCP within one week. We continued home medications: fenofibrate, niacin, lovaza   Chronic tobacco dependence with hx of COPD--smokes up to 1 pack per day.  -counseling  for cessation provided, not ready to quit at this time  -continue albuterol inhaler, spiriva   Anxiety and depression--appears stable at this time.  -continue cymbalta home dose  -xanax prn   Polycythemia-- Possibly reactive secondary to chronic tobacco dependence. She has never had a formal workup by hematology. Will refer for outpatient workup at hospital follow up.   Discharge Vitals:   BP 109/64  Pulse 97  Temp(Src) 98.5 F (36.9 C) (Oral)  Resp 18  Ht 5\' 2"  (1.575 m)  Wt 166 lb 3.2 oz (75.388 kg)  BMI 30.39 kg/m2  SpO2 92%  Discharge Labs:  Results for orders placed during  the hospital encounter of 10/10/13 (from the past 24 hour(s))  GLUCOSE, CAPILLARY     Status: Abnormal   Collection Time    10/10/13  6:10 PM      Result Value Ref Range   Glucose-Capillary 293 (*) 70 - 99 mg/dL  GLUCOSE, CAPILLARY     Status: Abnormal   Collection Time    10/10/13  9:27 PM      Result Value Ref Range   Glucose-Capillary 184 (*) 70 - 99 mg/dL   Comment 1 Notify RN    CBC WITH DIFFERENTIAL     Status: Abnormal   Collection Time    10/11/13  5:35 AM      Result Value Ref Range   WBC 13.7 (*) 4.0 - 10.5 K/uL   RBC 4.73  3.87 - 5.11 MIL/uL   Hemoglobin 14.9  12.0 - 15.0 g/dL   HCT 42.6  36.0 - 46.0 %   MCV 90.1  78.0 - 100.0 fL   MCH 31.5  26.0 - 34.0 pg   MCHC 35.0  30.0 - 36.0 g/dL   RDW 13.3  11.5 - 15.5 %   Platelets 407 (*) 150 - 400 K/uL   Neutrophils Relative % 63  43 - 77 %   Neutro Abs 8.7 (*) 1.7 - 7.7 K/uL   Lymphocytes Relative 26  12 - 46 %   Lymphs Abs 3.6  0.7 - 4.0 K/uL   Monocytes Relative 5  3 - 12 %   Monocytes Absolute 0.7  0.1 - 1.0 K/uL   Eosinophils Relative 5  0 - 5 %   Eosinophils Absolute 0.7  0.0 - 0.7 K/uL   Basophils Relative 1  0 - 1 %   Basophils Absolute 0.1  0.0 - 0.1 K/uL  LIPID PANEL     Status: Abnormal   Collection Time    10/11/13  5:35 AM      Result Value Ref Range   Cholesterol 266 (*) 0 - 200 mg/dL   Triglycerides 1419 (*) <150  mg/dL   HDL NOT REPORTED DUE TO HIGH TRIGLYCERIDES  >39 mg/dL   Total CHOL/HDL Ratio NOT REPORTED DUE TO HIGH TRIGLYCERIDES     VLDL UNABLE TO CALCULATE IF TRIGLYCERIDE OVER 400 mg/dL  0 - 40 mg/dL   LDL Cholesterol UNABLE TO CALCULATE IF TRIGLYCERIDE OVER 400 mg/dL  0 - 99 mg/dL  BASIC METABOLIC PANEL     Status: Abnormal   Collection Time    10/11/13  5:35 AM      Result Value Ref Range   Sodium 135 (*) 137 - 147 mEq/L   Potassium 4.4  3.7 - 5.3 mEq/L   Chloride 99  96 - 112 mEq/L   CO2 23  19 - 32 mEq/L   Glucose, Bld 248 (*) 70 - 99 mg/dL   BUN 8  6 - 23 mg/dL   Creatinine, Ser 0.41 (*) 0.50 - 1.10 mg/dL   Calcium 10.0  8.4 - 10.5 mg/dL   GFR calc non Af Amer >90  >90 mL/min   GFR calc Af Amer >90  >90 mL/min  GLUCOSE, CAPILLARY     Status: Abnormal   Collection Time    10/11/13  7:12 AM      Result Value Ref Range   Glucose-Capillary 256 (*) 70 - 99 mg/dL   Comment 1 Notify RN     Comment 2 Documented in Chart      Signed: Joni Reining, DO 10/11/2013, 9:11 AM  Time Spent on Discharge: 20 minutes Services Ordered on Discharge: none Equipment Ordered on Discharge: none

## 2013-10-11 NOTE — Procedures (Signed)
Informed consent obtained prior to procedure. I was present for entire procedure. No complications. Patient tolerated procedure well. Hemostasis achieved.  Dominic Pea, DO, [facility] 10/11/2013, 11:09 AM

## 2013-10-11 NOTE — Discharge Planning (Signed)
AVS to pt who verbalizes understanding, also given rx x3 .  Able to verbalize dressing change and has a daughter who is paramedic to assist as needed.  D'cd amb. To private car home with all personal belongings and dressing supplies, acomp. By husband, at 65.

## 2013-10-11 NOTE — H&P (Signed)
INTERNAL MEDICINE TEACHING SERVICE Attending Admission Note  Date: 10/11/2013  Patient name: Vanessa [name]  Medical record number: [id number]  Date of birth: May 19, 1961    I have seen and evaluated [name] and discussed their care with the Residency Team.  53 yr old woman with hx poor controlled type 2 DM, GERD, polycythemia, depression/anxiety, HL, and asthma, admitted due to concern for perineal abscess and cellulitis. She stated she noticed the area about a week prior and has tried treating it herself with warm soaks. This area is no history of fever or chills. She was initially noted too have a HR of 132 and have significant pain in the area along with a WBC of 18k and thus was directly admitted. On exam she was noted to have an indurated/fluctuant area on Right labia majora with tenderness. She had surrounding erythema and palpable inguinal adenopathy. The area underwent an I&D and significant amount of purulent drainage was noted. The area was packed with iodoform gauze. Hemostasis was achieved. This morning, Filed Vitals:   10/11/13 0606  BP: 109/64  Pulse: 97  Temp: 98.5 F (36.9 C)  Resp: 18   She stated she was much improved. The area was re-examined and no bleeding noted. Gauze covered the area and packing was intact. She is afebrile and leukocytosis has returned to her baseline. I agree with a course of doxy. I would have her seen in clinic by Tuesday to remove packing and re-examine area. Of note, a Trig level was obtained and was 1419. She will need to be re-evaluated for adherence or additional treatment options.  Medically stable for D/C.  Dominic Pea, DO, [facility] 10/11/2013, 11:31 AM

## 2013-10-12 LAB — HIV ANTIBODY (ROUTINE TESTING W REFLEX): HIV 1&2 Ab, 4th Generation: NONREACTIVE

## 2013-10-13 ENCOUNTER — Ambulatory Visit: Payer: Self-pay | Admitting: Internal Medicine

## 2013-10-13 NOTE — Progress Notes (Signed)
INTERNAL MEDICINE TEACHING ATTENDING ADDENDUM - Aldine Contes, MD: I reviewed and discussed at the time of visit with the resident Dr. Hayes Ludwig, the patient's medical history, physical examination, diagnosis and results of tests and treatment and I agree with the patient's care as documented. Of note, I attempted to see patient and confirm physical exam findings but patient had already left office. She had agreed to admission prior to leaving the office for likely abscess and was to be called when bed was available for admission.

## 2013-10-13 NOTE — Discharge Summary (Signed)
  Date: 10/13/2013  Patient name: Vanessa [name]  Medical record number: [id number]  Date of birth: 01-28-1961   This patient has been seen and the plan of care was discussed with the house staff. Please see their note for complete details. I concur with their findings and plan.  Dominic Pea, DO, [place] [facility] [facility] 10/13/2013, 12:31 PM

## 2013-10-14 ENCOUNTER — Encounter: Payer: Self-pay | Admitting: Internal Medicine

## 2013-10-14 ENCOUNTER — Ambulatory Visit (INDEPENDENT_AMBULATORY_CARE_PROVIDER_SITE_OTHER): Payer: Self-pay | Admitting: Internal Medicine

## 2013-10-14 VITALS — BP 117/71 | HR 104 | Temp 98.2°F | Wt 170.4 lb

## 2013-10-14 DIAGNOSIS — L02219 Cutaneous abscess of trunk, unspecified: Secondary | ICD-10-CM

## 2013-10-14 DIAGNOSIS — L03319 Cellulitis of trunk, unspecified: Secondary | ICD-10-CM

## 2013-10-14 DIAGNOSIS — L02215 Cutaneous abscess of perineum: Secondary | ICD-10-CM

## 2013-10-14 NOTE — Progress Notes (Signed)
Case discussed with Dr. Cater at the time of the visit.  We reviewed the resident's history and exam and pertinent patient test results.  I agree with the assessment, diagnosis and plan of care documented in the resident's note. 

## 2013-10-14 NOTE — Assessment & Plan Note (Signed)
Abscess appears to be healing well. Incision sites are clean, dry, intact. Still some pain with palpation but this is improving per the patient. One incision had already healed pver. I did exchange iodoform packing into the second, but was only able to trace ~1-2cm of tape inside. A dry 2 x 2 gauze was taped over the site. Blood cultures collected in the hospital are no growth to date. - Followup with Dr. Eulas Post in 2 days - Continue doxycycline - Continue dry dressing changes twice a day

## 2013-10-14 NOTE — Progress Notes (Signed)
Subjective:    Patient ID: Vanessa [name], female    DOB: 1960-09-14, 53 y.o.   MRN: [id number]  HPI  Vanessa [name] is a 53 y.o. female with PMH of Asthma, poorly controlled DM2, Hypertriglyceridemia, GERD, depression, and anxiety who presents for hospital followup.  The patient was admitted on 10/10/2013 for incision and drainage of a right-sided perineal abscess. Procedure was performed and yielded serosanguineous purulent drainage. Patient was discharged on doxycycline 100 mg twice a day x7 days. She presents for wound check and to exchange the I&D packing. She has an appointment on 10/16/13 with Dr. Eulas Post, her PCP, for followup of diabetes control and referral to Dr. Azucena Freed clinic for a history of polycythemia.  Blood cultures obtained in the hospital are no growth to date.  Patient reports she did have some stabbing pain in the area after discharge, but it improved over the weekend. She's noticed a little bit of reddish drainage that is resolving. Denies fevers or chills. She is taking her antibiotics everyday, twice a day. She is doing dry dressing changes with a pad twice a day. One of her iodoform packings fell out on Sunday, the other fell out this morning.   Current Outpatient Prescriptions on File Prior to Visit  Medication Sig Dispense Refill  . acetaminophen (TYLENOL) 500 MG tablet Take 1,000 mg by mouth every 6 (six) hours as needed for mild pain.      Marland Kitchen albuterol (PROVENTIL,VENTOLIN) 90 MCG/ACT inhaler Inhale 1-2 puffs into the lungs every 4 (four) hours as needed for wheezing.  1 each  3  . ALPRAZolam (XANAX) 0.5 MG tablet Take 1 tablet (0.5 mg total) by mouth 3 (three) times daily as needed for sleep or anxiety.  90 tablet  3  . aspirin 81 MG tablet Take 1 tablet (81 mg total) by mouth daily.  30 tablet  2  . diphenhydrAMINE (BENADRYL) 25 mg capsule Take 50 mg by mouth every 6 (six) hours as needed for allergies.      Marland Kitchen doxycycline (VIBRA-TABS) 100 MG tablet Take 1 tablet  (100 mg total) by mouth 2 (two) times daily.  12 tablet  0  . DULoxetine (CYMBALTA) 60 MG capsule Take 1 capsule (60 mg total) by mouth 2 (two) times daily.  60 capsule  11  . fenofibrate (TRICOR) 145 MG tablet Take 1 tablet (145 mg total) by mouth daily.  90 tablet  3  . Fluticasone-Salmeterol (ADVAIR DISKUS) 500-50 MCG/DOSE AEPB Inhale 1 puff into the lungs every 12 (twelve) hours.  2 each  6  . ibuprofen (ADVIL,MOTRIN) 200 MG tablet Take 600 mg by mouth every 6 (six) hours as needed for moderate pain.      Marland Kitchen insulin glargine (LANTUS) 100 UNIT/ML injection Inject 45 in the morning and 35 in the evening .  24 mL  11  . lisinopril (PRINIVIL,ZESTRIL) 2.5 MG tablet Take 5 mg by mouth daily.      . metFORMIN (GLUCOPHAGE) 1000 MG tablet Take 1,000 mg by mouth 2 (two) times daily with a meal.      . niacin (NIASPAN) 500 MG CR tablet Take 1 tablet (500 mg total) by mouth 2 (two) times daily.  180 tablet  3  . omega-3 acid ethyl esters (LOVAZA) 1 G capsule Take 2 capsules (2 g total) by mouth 2 (two) times daily.  360 capsule  3  . omeprazole (PRILOSEC) 20 MG capsule Take 1 capsule (20 mg total) by mouth daily.  30 capsule  2  . promethazine (PHENERGAN) 25 MG tablet Take 1 tablet (25 mg total) by mouth every 6 (six) hours as needed for nausea or vomiting.  12 tablet  0  . Simethicone (GAS-X EXTRA STRENGTH PO) Take 1 tablet by mouth 3 (three) times daily as needed (gas).      Marland Kitchen tiotropium (SPIRIVA HANDIHALER) 18 MCG inhalation capsule Place 1 capsule (18 mcg total) into inhaler and inhale daily.  90 capsule  11    Review of Systems  Constitutional: Negative for fever and chills.  Respiratory: Negative for shortness of breath.   Skin: Negative for color change and rash.      Objective:   Physical Exam  Constitutional: She appears well-developed and well-nourished.  HENT:  Head: Normocephalic and atraumatic.  Cardiovascular: Normal rate, regular rhythm and normal heart sounds.  Exam reveals no  gallop and no friction rub.   No murmur heard. Pulmonary/Chest: Effort normal and breath sounds normal.  Genitourinary:       Filed Vitals:   10/14/13 0848  BP: 117/71  Pulse: 104  Temp: 98.2 F (36.8 C)       Assessment & Plan:   Please see problem based charting.

## 2013-10-14 NOTE — Patient Instructions (Signed)
Thank you for your visit. - Please continue to change your dry dressings twice a day. - Continue taking your antibiotic. - Please follow up with Dr. Eulas Post on 4/23 for follow up. - If you develop fever, worsening pain, pus-like drainage, please call the clinic for an acute visit.

## 2013-10-16 ENCOUNTER — Encounter: Payer: No Typology Code available for payment source | Admitting: Internal Medicine

## 2013-10-16 LAB — CULTURE, BLOOD (SINGLE)
ORGANISM ID, BACTERIA: NO GROWTH
Organism ID, Bacteria: NO GROWTH

## 2013-10-20 NOTE — Addendum Note (Signed)
Addended by: Truddie Crumble on: 10/20/2013 02:24 PM   Modules accepted: Orders

## 2013-10-27 ENCOUNTER — Ambulatory Visit (INDEPENDENT_AMBULATORY_CARE_PROVIDER_SITE_OTHER): Payer: No Typology Code available for payment source | Admitting: Family Medicine

## 2013-10-27 ENCOUNTER — Encounter: Payer: Self-pay | Admitting: Family Medicine

## 2013-10-27 VITALS — BP 131/79 | HR 104 | Temp 96.9°F | Ht 62.0 in | Wt 167.5 lb

## 2013-10-27 DIAGNOSIS — Z124 Encounter for screening for malignant neoplasm of cervix: Secondary | ICD-10-CM

## 2013-10-27 DIAGNOSIS — R109 Unspecified abdominal pain: Secondary | ICD-10-CM

## 2013-10-27 DIAGNOSIS — Z01812 Encounter for preprocedural laboratory examination: Secondary | ICD-10-CM

## 2013-10-27 DIAGNOSIS — N951 Menopausal and female climacteric states: Secondary | ICD-10-CM

## 2013-10-27 LAB — POCT PREGNANCY, URINE: Preg Test, Ur: NEGATIVE

## 2013-10-27 NOTE — Patient Instructions (Signed)
Bloating  Bloating is the feeling of fullness in your belly. You may feel as though your pants are too tight. Often the cause of bloating is overeating, retaining fluids, or having gas in your bowel. It is also caused by swallowing air and eating foods that cause gas. Irritable bowel syndrome is one of the most common causes of bloating. Constipation is also a common cause. Sometimes more serious problems can cause bloating.  SYMPTOMS   Usually there is a feeling of fullness, as though your abdomen is bulged out. There may be mild discomfort.   DIAGNOSIS   Usually no particular testing is necessary for most bloating. If the condition persists and seems to become worse, your caregiver may do additional testing.   TREATMENT   · There is no direct treatment for bloating.  · Do not put gas into the bowel. Avoid chewing gum and sucking on candy. These tend to make you swallow air. Swallowing air can also be a nervous habit. Try to avoid this.  · Avoiding high residue diets will help. Eat foods with soluble fibers (examples include root vegetables, apples, or barley) and substitute dairy products with soy and rice products. This helps irritable bowel syndrome.  · If constipation is the cause, then a high residue diet with more fiber will help.  · Avoid carbonated beverages.  · Over-the-counter preparations are available that help reduce gas. Your pharmacist can help you with this.  SEEK MEDICAL CARE IF:   · Bloating continues and seems to be getting worse.  · You notice a weight gain.  · You have a weight loss but the bloating is getting worse.  · You have changes in your bowel habits or develop nausea or vomiting.  SEEK IMMEDIATE MEDICAL CARE IF:   · You develop shortness of breath or swelling in your legs.  · You have an increase in abdominal pain or develop chest pain.  Document Released: 04/12/2006 Document Revised: 09/04/2011 Document Reviewed: 05/31/2007  Exit[employer]® Patient Information ©2014 [employer].

## 2013-10-27 NOTE — Progress Notes (Signed)
    Subjective:    Patient ID: Vanessa [name] is a 53 y.o. female presenting with referral from IM on 10/27/2013  HPI: Reports several month h/o bloating and nausea.  Worried about ovarian cancer.  Transitioning through menopause. Had no cycle x 8 months, then had one in 10/14.  No bleeding since that time.  Had heavy menstrual bleeding prior to menopause. Had episode of pain in 11/14 that they thought was a kidney stone and had a CT that showed an enlarged ovary at 2.9 cm.  Would like to rule out ovarian cancer.  Has family h/o some female cancers, unsure of type.  Mom had hysterectomy at 2.  LMP in 10/14. No pap for some time.  Review of Systems  Constitutional: Negative for unexpected weight change (weight loss, 30 lbs).  Respiratory: Negative for shortness of breath.   Cardiovascular: Negative for chest pain.  Gastrointestinal: Positive for nausea, abdominal pain, diarrhea, constipation and abdominal distention (bloating). Negative for vomiting and blood in stool.  Genitourinary: Negative for hematuria and vaginal bleeding.      Objective:    BP 131/79  Pulse 104  Temp(Src) 96.9 F (36.1 C) (Oral)  Ht 5\' 2"  (1.575 m)  Wt 167 lb 8 oz (75.978 kg)  BMI 30.63 kg/m2 Physical Exam  Vitals reviewed. Constitutional: She appears well-developed and well-nourished.  HENT:  Head: Normocephalic and atraumatic.  Cardiovascular: Normal rate.   Pulmonary/Chest: Effort normal.  Abdominal: Soft.  Genitourinary: Vagina normal and uterus normal.    Uterus is not enlarged and not tender. Right adnexum displays no mass and no tenderness. Left adnexum displays no mass and no tenderness.  Musculoskeletal: She exhibits no edema.  Neurological: She is alert.  Skin: Skin is warm.  Psychiatric: She has a normal mood and affect.        Assessment & Plan:   Pre-procedure lab exam  Abdominal pain - Plan: US Transvaginal Non-OB, US Pelvis Complete  Screening for malignant neoplasm of the cervix -  Plan: Cytology - PAP  Peri-menopause - Plan: Follicle stimulating hormone   Return in about 4 weeks (around 11/24/2013) for a follow-up.

## 2013-10-27 NOTE — Progress Notes (Signed)
Pt reports having an episode of heavy bleeding in November, lasted for a week. Has not had an episode since. Pt. Reports severe bloating, RLQ/pelvic pain, and weight loss(was 180 in November).

## 2013-10-27 NOTE — Assessment & Plan Note (Signed)
Unlikely to be ovarian cancer--she will see GI soon--will check pelvic sono.

## 2013-10-28 LAB — FOLLICLE STIMULATING HORMONE: FSH: 12.4 m[IU]/mL

## 2013-10-31 ENCOUNTER — Ambulatory Visit (HOSPITAL_COMMUNITY)
Admission: RE | Admit: 2013-10-31 | Discharge: 2013-10-31 | Disposition: A | Discharge status: Home / Self Care | Payer: No Typology Code available for payment source | Source: Ambulatory Visit | Attending: Family Medicine | Admitting: Family Medicine

## 2013-10-31 DIAGNOSIS — R1031 Right lower quadrant pain: Secondary | ICD-10-CM | POA: Insufficient documentation

## 2013-10-31 DIAGNOSIS — N946 Dysmenorrhea, unspecified: Secondary | ICD-10-CM | POA: Insufficient documentation

## 2013-10-31 DIAGNOSIS — R109 Unspecified abdominal pain: Secondary | ICD-10-CM

## 2013-10-31 DIAGNOSIS — R141 Gas pain: Secondary | ICD-10-CM | POA: Insufficient documentation

## 2013-10-31 DIAGNOSIS — R634 Abnormal weight loss: Secondary | ICD-10-CM | POA: Insufficient documentation

## 2013-10-31 DIAGNOSIS — R143 Flatulence: Secondary | ICD-10-CM

## 2013-10-31 DIAGNOSIS — D259 Leiomyoma of uterus, unspecified: Secondary | ICD-10-CM | POA: Insufficient documentation

## 2013-10-31 DIAGNOSIS — N83209 Unspecified ovarian cyst, unspecified side: Secondary | ICD-10-CM | POA: Insufficient documentation

## 2013-10-31 DIAGNOSIS — R142 Eructation: Secondary | ICD-10-CM

## 2013-11-03 ENCOUNTER — Telehealth: Payer: Self-pay

## 2013-11-03 NOTE — Telephone Encounter (Signed)
Pt. Called requesting pap results as well as ultrasound results from visit to ED on 10/31/13. Called pt. And informed her of negative pap and ultrasound results. Pt. Stated "So no sign of cancer?" Informed pt. That pap smear was normal-- no cancer and that ultrasound did not suggest the need for further work up. Pt. To return for f/u in 4 weeks. Pt. Verbalized understanding and gratitude. NO further questions or concerns.

## 2013-11-06 ENCOUNTER — Telehealth: Payer: Self-pay | Admitting: *Deleted

## 2013-11-06 ENCOUNTER — Encounter: Payer: Self-pay | Admitting: Internal Medicine

## 2013-11-06 NOTE — Telephone Encounter (Signed)
Pt aware of results 

## 2013-11-06 NOTE — Telephone Encounter (Signed)
Message copied by Gerri Spore on Thu Nov 06, 2013  8:27 AM ------      Message from: Donnamae Jude      Created: Fri Oct 31, 2013  4:35 PM       U/s shows simple appearing ovarian cyst. ------

## 2013-11-25 ENCOUNTER — Other Ambulatory Visit: Payer: Self-pay | Admitting: Internal Medicine

## 2013-11-25 NOTE — Telephone Encounter (Signed)
Xanax rx called to [facility]. 

## 2013-11-27 ENCOUNTER — Ambulatory Visit: Payer: Self-pay | Admitting: Family Medicine

## 2013-12-01 ENCOUNTER — Other Ambulatory Visit: Payer: Self-pay | Admitting: *Deleted

## 2013-12-01 DIAGNOSIS — R062 Wheezing: Secondary | ICD-10-CM

## 2013-12-01 DIAGNOSIS — J449 Chronic obstructive pulmonary disease, unspecified: Secondary | ICD-10-CM

## 2013-12-01 DIAGNOSIS — J45909 Unspecified asthma, uncomplicated: Secondary | ICD-10-CM

## 2013-12-01 MED ORDER — ALBUTEROL SULFATE HFA 108 (90 BASE) MCG/ACT IN AERS: 1.0000 | INHALATION_SPRAY | RESPIRATORY_TRACT | Status: DC | PRN

## 2013-12-01 NOTE — Telephone Encounter (Signed)
Also needs refill on Albuterol 90 mcg inhaler 1-2 puffs, every 4 hours PRN - which needs to be fax to [facility].  States she has to sign up again at [facility] and needs a new rx faxed also.

## 2013-12-01 NOTE — Telephone Encounter (Signed)
Sorry Dr Marinda Elk, she does not need the Lisinopril at this time; she had called [facility] and has refills for 2.5mg . Thanks

## 2013-12-01 NOTE — Telephone Encounter (Signed)
I faxed the prescription for albuterol HFA to [facility].  Please find out what dose of lisinopril (2.5 mg or 5 mg tablet) and how many tablets patient is taking per day.

## 2013-12-28 ENCOUNTER — Other Ambulatory Visit: Payer: Self-pay | Admitting: Internal Medicine

## 2013-12-30 ENCOUNTER — Other Ambulatory Visit: Payer: Self-pay | Admitting: *Deleted

## 2013-12-30 NOTE — Telephone Encounter (Signed)
Please call Vanessa [name] and let her know that she needs to be evaluated in the clinic to continue receiving her Xanax. She has no showed her last few appointments.

## 2013-12-30 NOTE — Telephone Encounter (Signed)
Message sent to front desk pool for appt. Rx was called in to pharmacy.

## 2014-01-01 ENCOUNTER — Encounter: Payer: No Typology Code available for payment source | Admitting: Internal Medicine

## 2014-01-01 MED ORDER — FLUTICASONE-SALMETEROL 500-50 MCG/DOSE IN AEPB: 1.0000 | INHALATION_SPRAY | Freq: Two times a day (BID) | RESPIRATORY_TRACT | Status: DC

## 2014-01-01 NOTE — Telephone Encounter (Signed)
Rx called in to pharmacy. 

## 2014-02-05 ENCOUNTER — Ambulatory Visit (INDEPENDENT_AMBULATORY_CARE_PROVIDER_SITE_OTHER): Payer: Self-pay | Admitting: Internal Medicine

## 2014-02-05 ENCOUNTER — Encounter: Payer: Self-pay | Admitting: Internal Medicine

## 2014-02-05 ENCOUNTER — Encounter: Payer: Self-pay | Admitting: Dietician

## 2014-02-05 VITALS — BP 119/79 | HR 112 | Temp 100.3°F | Wt 167.6 lb

## 2014-02-05 DIAGNOSIS — IMO0002 Reserved for concepts with insufficient information to code with codable children: Secondary | ICD-10-CM

## 2014-02-05 DIAGNOSIS — F17200 Nicotine dependence, unspecified, uncomplicated: Secondary | ICD-10-CM

## 2014-02-05 DIAGNOSIS — E118 Type 2 diabetes mellitus with unspecified complications: Principal | ICD-10-CM

## 2014-02-05 DIAGNOSIS — IMO0001 Reserved for inherently not codable concepts without codable children: Secondary | ICD-10-CM

## 2014-02-05 DIAGNOSIS — F341 Dysthymic disorder: Secondary | ICD-10-CM

## 2014-02-05 DIAGNOSIS — E1165 Type 2 diabetes mellitus with hyperglycemia: Secondary | ICD-10-CM

## 2014-02-05 DIAGNOSIS — Z0000 Encounter for general adult medical examination without abnormal findings: Secondary | ICD-10-CM

## 2014-02-05 LAB — POCT GLYCOSYLATED HEMOGLOBIN (HGB A1C): Hemoglobin A1C: 9.2

## 2014-02-05 LAB — GLUCOSE, CAPILLARY: Glucose-Capillary: 223 mg/dL — ABNORMAL HIGH (ref 70–99)

## 2014-02-05 LAB — HM DIABETES EYE EXAM

## 2014-02-05 MED ORDER — ALPRAZOLAM 0.5 MG PO TABS: ORAL_TABLET | ORAL | Status: DC

## 2014-02-05 NOTE — Assessment & Plan Note (Addendum)
Lab Results  Component Value Date   HGBA1C 9.2 02/05/2014   HGBA1C 9.9 09/25/2013   HGBA1C 7.8 05/01/2013     Assessment: Diabetes control: poor control (HgbA1C >9%) Progress toward A1C goal:  improved Comments: Pt endorses compliance with her Lantus 45 in the morning and 35 in the evening and metformin 1000mg  BID. Her diet has been poor.   Plan: Medications:  continue current medications Home glucose monitoring: Frequency: once a day Timing: before meals Instruction/counseling given: reminded to get eye exam, reminded to bring blood glucose meter & log to each visit, reminded to bring medications to each visit and discussed diet Educational resources provided:   Self management tools provided: home glucose logbook Other plans: Pt is to keep a record of her CBGs, checking 2 x day before breakfast and dinner. She is to bring in her CBG readings after 1 month.  Pt is getting retinal scan here in the clinic today. She was referred to [facility] for visual acuity testing as well.

## 2014-02-05 NOTE — Assessment & Plan Note (Signed)
  Assessment: Progress toward smoking cessation:  smoking the same amount Barriers to progress toward smoking cessation:  stress Comments: Pt states that her smoking increases with increased stressors.   Plan: Instruction/counseling given:  I counseled patient on the dangers of tobacco use, advised patient to stop smoking, and reviewed strategies to maximize success.

## 2014-02-05 NOTE — Assessment & Plan Note (Addendum)
She has been to counseling in the past but with her father's illness and her grief after his death, she still stopped going. She has been out of her Xanax x1 mo b/c she has no showed her clinic appts and was told she needed to be seen to receive a refill. Pt tearful on exam and has been over approximately the past year, since her father became ill. The Cymbalta seemed to help, but with her being out of her Xanax, her anxiety and depression have worsened. 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  - Xanax 0.5mg  TID PRN - Continue the Cymbalta - SW referral - Psychiatry referral, preferably at [facility].

## 2014-02-05 NOTE — Patient Instructions (Addendum)
Check your blood sugar 2 times a day before breakfast and dinner and keep a log. Bring that to the clinic in 1 month.    General Instructions:   Please bring your medicines with you each time you come to clinic.  Medicines may include prescription medications, over-the-counter medications, herbal remedies, eye drops, vitamins, or other pills.   Progress Toward Treatment Goals:  Treatment Goal 02/05/2014  Hemoglobin A1C improved  Blood pressure at goal  Stop smoking smoking the same amount    Self Care Goals & Plans:  Self Care Goal 09/25/2013  Manage my medications take my medicines as prescribed; bring my medications to every visit; refill my medications on time; follow the sick day instructions if I am sick  Monitor my health keep track of my weight; check my feet daily  Eat healthy foods eat more vegetables; eat fruit for snacks and desserts; eat smaller portions; drink diet soda or water instead of juice or soda  Be physically active find an activity I enjoy  Stop smoking -    Home Blood Glucose Monitoring 09/25/2013  Check my blood sugar no home glucose monitoring  When to check my blood sugar -     Care Management & Community Referrals:  No flowsheet data found.

## 2014-02-05 NOTE — Progress Notes (Signed)
Patient ID: Vanessa [name], female   DOB: Jun 06, 1961, 53 y.o.   MRN: [id number]  Subjective:   Patient ID: Vanessa [name] female   DOB: 11-18-1960 53 y.o.   MRN: [id number]  HPI: [name] is a 53 y.o. 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   Her daughter is with her today and feels that the patient's chest pain was 2/2 anxiety after her Xanax refill was refused due to multiple clinic visit cancellations. The pt was warned that she needed an appt and the Xanax would not be refilled until she was seen by a provider. She states that the pain is at the center of her chest and feels like "painful heartburn". It occurs at rest and last for a few minutes. The last episode occurred on Mon and was improved with NTG, ASA, and a valium. She states that before the pain occurs, she does have anxiety. She continues to have significant social stressors and has stressful events that occur. She was previously seeing a psychologist/therapist, but this was over 3 ys ago. She requests to see another therapist.   She was also worked up by GYN over the past few months for post menopausal bleeding, which was benign.   Her last A1c was 9.9 in April. Today it is 9.2.  She denies fevers, chills, SOB, DOE, dysuria, urgency/frequency, peripheral edema, and abdominal pain is improved. +chest pain (not actively) and significant anxiety.   Past Medical History  Diagnosis Date  . Asthma     No PFT performed  . Diabetes mellitus 2002  . Depression   . Panic attacks     mostly Agaraphobia  . GERD (gastroesophageal reflux disease)   . Helicobacter pylori (H. pylori) infection     s/p triple therapy  . Hyperlipidemia   . Hepatic hemangioma   . Hypertriglyceridemia    Current Outpatient Prescriptions  Medication Sig Dispense Refill  . acetaminophen (TYLENOL) 500 MG tablet  Take 1,000 mg by mouth every 6 (six) hours as needed for mild pain.      Marland Kitchen albuterol (PROVENTIL HFA;VENTOLIN HFA) 108 (90 BASE) MCG/ACT inhaler Inhale 1-2 puffs into the lungs every 4 (four) hours as needed for wheezing.  3 Inhaler  0  . albuterol (PROVENTIL,VENTOLIN) 90 MCG/ACT inhaler Inhale 1-2 puffs into the lungs every 4 (four) hours as needed for wheezing.  1 each  3  . ALPRAZolam (XANAX) 0.5 MG tablet TAKE ONE TABLET BY MOUTH THREE TIMES DAILY AS NEEDED FOR SLEEP OR  ANXIETY  90 tablet  0  . aspirin 81 MG tablet Take 1 tablet (81 mg total) by mouth daily.  30 tablet  2  . diphenhydrAMINE (BENADRYL) 25 mg capsule Take 50 mg by mouth every 6 (six) hours as needed for allergies.      . DULoxetine (CYMBALTA) 60 MG capsule Take 1 capsule (60 mg total) by mouth 2 (two) times daily.  60 capsule  11  . fenofibrate (TRICOR) 145 MG tablet Take 1 tablet (145 mg total) by mouth daily.  90 tablet  3  . Fluticasone-Salmeterol (ADVAIR DISKUS) 500-50 MCG/DOSE AEPB Inhale 1 puff into the lungs every 12 (twelve) hours.  2 each  6  . ibuprofen (ADVIL,MOTRIN) 200 MG tablet Take 600 mg by mouth every 6 (six) hours as needed for moderate pain.      Marland Kitchen  insulin glargine (LANTUS) 100 UNIT/ML injection Inject 45 in the morning and 35 in the evening .  24 mL  11  . lisinopril (PRINIVIL,ZESTRIL) 2.5 MG tablet Take 5 mg by mouth daily.      . metFORMIN (GLUCOPHAGE) 1000 MG tablet Take 1,000 mg by mouth 2 (two) times daily with a meal.      . niacin (NIASPAN) 500 MG CR tablet Take 1 tablet (500 mg total) by mouth 2 (two) times daily.  180 tablet  3  . omega-3 acid ethyl esters (LOVAZA) 1 G capsule Take 2 capsules (2 g total) by mouth 2 (two) times daily.  360 capsule  3  . omeprazole (PRILOSEC) 20 MG capsule Take 1 capsule (20 mg total) by mouth daily.  30 capsule  2  . promethazine (PHENERGAN) 25 MG tablet Take 1 tablet (25 mg total) by mouth every 6 (six) hours as needed for nausea or vomiting.  12 tablet  0  .  Simethicone (GAS-X EXTRA STRENGTH PO) Take 1 tablet by mouth 3 (three) times daily as needed (gas).      Marland Kitchen tiotropium (SPIRIVA HANDIHALER) 18 MCG inhalation capsule Place 1 capsule (18 mcg total) into inhaler and inhale daily.  90 capsule  11   No current facility-administered medications for this visit.   Family History  Problem Relation Age of Onset  . Colon polyps Mother     benign  . Cancer Mother     ovarian cancer  . Cancer Maternal Aunt     breast cancer, ovarian cancer  . Diabetes Maternal Uncle   . Cancer Father     Prostate Cancer    History   Social History  . Marital Status: Married    Spouse Name: N/A    Number of Children: N/A  . Years of Education: N/A   Social History Main Topics  . Smoking status: Current Every Day Smoker -- 1.50 packs/day for 16 years    Types: Cigarettes  . Smokeless tobacco: Never Used  . Alcohol Use: No     Comment: Seldom  . Drug Use: No  . Sexual Activity: Not Currently   Other Topics Concern  . None   Social History Narrative   Drinks at least a pot of coffee every day.    Review of Systems: A 12 point ROS was performed; pertinent positives and negatives were noted in the HPI   Objective:  Physical Exam: Filed Vitals:   02/05/14 1430  BP: 119/79  Pulse: 112  Temp: 100.3 F (37.9 C)  TempSrc: Oral  Weight: 167 lb 9.6 oz (76.023 kg)  SpO2: 90%   Constitutional: Vital signs reviewed.  Patient is a well-developed and well-nourished female in no acute distress and cooperative with exam. Alert and oriented x3.  Head: Normocephalic and atraumatic Eyes: PERRL, EOMI. No scleral icterus.  Cardiovascular: Tachycardic but regular, no MRG, pulses symmetric and intact bilaterally Pulmonary/Chest: Normal respiratory effort, CTAB, no wheezes, rales, or rhonchi Abdominal: Soft. Non-tender, + distension, unchanged Musculoskeletal: No joint deformities, erythema, or stiffness, ROM full and no nontender Neurological: A&O x3, Strength  is normal and symmetric bilaterally, cranial nerve II-XII are grossly intact, 5/5 strength in BU&LE  Skin: Warm, dry and intact.   Psychiatric: Tearful throughout exam but redirectable.    Assessment & Plan:   Please refer to Problem List based Assessment and Plan

## 2014-02-05 NOTE — Assessment & Plan Note (Signed)
Retinal exam today. Referred for visual acuity testing and colonoscopy today. Flu vaccine once we get it in the clinic.

## 2014-02-06 ENCOUNTER — Telehealth: Payer: Self-pay | Admitting: Licensed Clinical Social Worker

## 2014-02-06 NOTE — Telephone Encounter (Signed)
Vanessa [name] was referred to CSW for psychiatry/therapy resources in the community.  Pt is currently uninsured and without the [place].  CSW will refer Vanessa [name] to [facility]. CSW placed called to pt.  CSW left message requesting return call. CSW provided contact hours and phone number.

## 2014-02-06 NOTE — Telephone Encounter (Signed)
Ms. Bucks returned call to [place] requesting CSW to call back at [phone].  Stating this was her correct number.  CSW returned call using phone number provided by patient, pt requesting CSW to update chart as well.  Pt was active with [facility] 3 years ago, was receiving counseling only.  Pt was dismissed from this practice due to frequent no shows.  Pt states during this time, her father became ill and she was unable to maintain appointments.  Ms. Iannacone is in agreement for therapy services.  CSW inquired if pt wanted psychiatry as well for medication management.  Pt states "I already receive medication.  I"m on Xanax and Cymbalta."  CSW will inquire if PCP will continue to manage as referral to psychiatry was placed.  Mr. Arrellano is currently uninsured and does not plan to apply for the [facility] card, as she was over income last year and states her income has not changed.  Pt also has no plan to obtain insurance through [facility] as she states she can not afford the deductibles.  CSW referred Ms. Lavella Hammock to [employer] or [facility] [place], as both agencies provided both counseling and psychiatry on a sliding fee schedule.  Ms. Ghanem requesting CSW to place information in the mail.  CSW will also include information on add'l counseling services that provide counseling only based on income.

## 2014-02-09 NOTE — Progress Notes (Signed)
Case discussed with Dr. Glenn soon after the resident saw the patient.  We reviewed the resident's history and exam and pertinent patient test results.  I agree with the assessment, diagnosis, and plan of care documented in the resident's note. 

## 2014-02-18 NOTE — Addendum Note (Signed)
Addended by: Marcelino Duster on: 02/18/2014 01:56 PM   Modules accepted: Orders

## 2014-02-19 NOTE — Telephone Encounter (Signed)
Confirmed with PCP,  PCP recommending evaluation by psychiatry in addition to services pt would like set up.  CSW will send referral to [facility].

## 2014-02-24 NOTE — Telephone Encounter (Signed)
Referral faxed 02/19/14.

## 2014-03-04 ENCOUNTER — Other Ambulatory Visit: Payer: Self-pay | Admitting: *Deleted

## 2014-03-04 MED ORDER — ALBUTEROL SULFATE HFA 108 (90 BASE) MCG/ACT IN AERS: 1.0000 | INHALATION_SPRAY | RESPIRATORY_TRACT | Status: DC | PRN

## 2014-03-04 MED ORDER — TIOTROPIUM BROMIDE MONOHYDRATE 18 MCG IN CAPS: 18.0000 ug | ORAL_CAPSULE | Freq: Every day | RESPIRATORY_TRACT | Status: DC

## 2014-03-05 NOTE — Telephone Encounter (Signed)
Called to pharm 

## 2014-03-11 ENCOUNTER — Encounter: Payer: Self-pay | Admitting: Internal Medicine

## 2014-03-11 ENCOUNTER — Ambulatory Visit (INDEPENDENT_AMBULATORY_CARE_PROVIDER_SITE_OTHER): Payer: Self-pay | Admitting: Internal Medicine

## 2014-03-11 VITALS — BP 138/69 | HR 98 | Temp 98.2°F | Ht 62.0 in | Wt 163.2 lb

## 2014-03-11 DIAGNOSIS — I10 Essential (primary) hypertension: Secondary | ICD-10-CM | POA: Insufficient documentation

## 2014-03-11 DIAGNOSIS — R110 Nausea: Secondary | ICD-10-CM

## 2014-03-11 DIAGNOSIS — F341 Dysthymic disorder: Secondary | ICD-10-CM

## 2014-03-11 DIAGNOSIS — Z23 Encounter for immunization: Secondary | ICD-10-CM

## 2014-03-11 DIAGNOSIS — E1165 Type 2 diabetes mellitus with hyperglycemia: Secondary | ICD-10-CM

## 2014-03-11 DIAGNOSIS — J410 Simple chronic bronchitis: Secondary | ICD-10-CM

## 2014-03-11 DIAGNOSIS — F17200 Nicotine dependence, unspecified, uncomplicated: Secondary | ICD-10-CM

## 2014-03-11 DIAGNOSIS — E118 Type 2 diabetes mellitus with unspecified complications: Secondary | ICD-10-CM

## 2014-03-11 DIAGNOSIS — IMO0002 Reserved for concepts with insufficient information to code with codable children: Secondary | ICD-10-CM

## 2014-03-11 DIAGNOSIS — J449 Chronic obstructive pulmonary disease, unspecified: Secondary | ICD-10-CM

## 2014-03-11 LAB — GLUCOSE, CAPILLARY: Glucose-Capillary: 256 mg/dL — ABNORMAL HIGH (ref 70–99)

## 2014-03-11 MED ORDER — TIOTROPIUM BROMIDE MONOHYDRATE 18 MCG IN CAPS: 18.0000 ug | ORAL_CAPSULE | Freq: Every day | RESPIRATORY_TRACT | Status: DC

## 2014-03-11 MED ORDER — ALPRAZOLAM 0.5 MG PO TABS: ORAL_TABLET | ORAL | Status: DC

## 2014-03-11 MED ORDER — LISINOPRIL 5 MG PO TABS: 5.0000 mg | ORAL_TABLET | Freq: Every day | ORAL | Status: DC

## 2014-03-11 NOTE — Assessment & Plan Note (Signed)
Lab Results  Component Value Date   HGBA1C 9.2 02/05/2014   HGBA1C 9.9 09/25/2013   HGBA1C 7.8 05/01/2013     Assessment: Diabetes control: poor control (HgbA1C >9%) Progress toward A1C goal:  unchanged Comments: Pt states that her CBGs have improved to 180 and less. Denies hypoglycemia. She did not bring her meter or log book with her today. She states that she has been keeping a log but forgot it on her way out the door today.   Plan: Medications:  continue current medications: Lantus 45u qam and 35u qhs, Metformin 10000mg  BID Home glucose monitoring: Frequency: 2 times a day Timing: before meals Instruction/counseling given: reminded to bring blood glucose meter & log to each visit, reminded to bring medications to each visit and discussed diet Educational resources provided: brochure;handout Self management tools provided:   Other plans: F/u in 2 months for A1c

## 2014-03-11 NOTE — Progress Notes (Signed)
Patient ID: Vanessa [name], female   DOB: [birth year], 53 y.o.   MRN: [id number]  Subjective:   Patient ID: Vanessa [name] female   DOB: [birth year] 53 y.o.   MRN: [id number]  HPI: [name] is a 53 y.o. F w/ PMH Asthma, poorly controlled DM2, Hypertriglyceridemia, GERD, depression, and anxiety who presents for a routine f/u.  At her last visit in August her A1c was 9.2. She was asked to keep a record of her CBGs, checking 2 x day before breakfast and dinner. She was to bring in her CBG readings after 1 month. Retinal scan was done in the clinic which did not show any diabetic retinopathy. She was referred to [facility] for visual acuity testing as well. She was asked check her CBGs 2x daily, to keep a log of her BCG recordings, and bring them in with her today, but did not. Her CBG today in the clinic was 256 after eating a ham and cheese sandwich. She states that they have been running around 180 after meals. She denies any lows. She is working on improving her diet, and has cut back on her portion sizes. Her weight is down 4lbs from her last visit.   She is in good spirits today. Monday is her anniversary,  and she and her husband are going to the beach. She states that she has an appt at [facility] with Psychiatry on 9/30.   She is still smoking 1- 1 1/2 ppd, depending on her mood.    Past Medical History  Diagnosis Date  . Asthma     No PFT performed  . Diabetes mellitus 2002  . Depression   . Panic attacks     mostly Agaraphobia  . GERD (gastroesophageal reflux disease)   . Helicobacter pylori (H. pylori) infection     s/p triple therapy  . Hyperlipidemia   . Hepatic hemangioma   . Hypertriglyceridemia    Current Outpatient Prescriptions  Medication Sig Dispense Refill  . acetaminophen (TYLENOL) 500 MG tablet Take 1,000 mg by mouth every 6 (six) hours as needed for mild pain.      Marland Kitchen albuterol (PROVENTIL HFA;VENTOLIN HFA) 108 (90 BASE) MCG/ACT inhaler Inhale 1-2 puffs into the lungs every 4  (four) hours as needed for wheezing.  9 Inhaler  4  . albuterol (PROVENTIL,VENTOLIN) 90 MCG/ACT inhaler Inhale 1-2 puffs into the lungs every 4 (four) hours as needed for wheezing.  1 each  3  . ALPRAZolam (XANAX) 0.5 MG tablet TAKE ONE TABLET BY MOUTH THREE TIMES DAILY AS NEEDED FOR SLEEP OR  ANXIETY  90 tablet  0  . aspirin 81 MG tablet Take 1 tablet (81 mg total) by mouth daily.  30 tablet  2  . diphenhydrAMINE (BENADRYL) 25 mg capsule Take 50 mg by mouth every 6 (six) hours as needed for allergies.      . DULoxetine (CYMBALTA) 60 MG capsule Take 1 capsule (60 mg total) by mouth 2 (two) times daily.  60 capsule  11  . fenofibrate (TRICOR) 145 MG tablet Take 1 tablet (145 mg total) by mouth daily.  90 tablet  3  . Fluticasone-Salmeterol (ADVAIR DISKUS) 500-50 MCG/DOSE AEPB Inhale 1 puff into the lungs every 12 (twelve) hours.  2 each  6  . ibuprofen (ADVIL,MOTRIN) 200 MG tablet Take 600 mg by mouth every 6 (six) hours as needed for moderate pain.      Marland Kitchen insulin glargine (LANTUS) 100 UNIT/ML injection Inject 45 in the morning and  35 in the evening .  24 mL  11  . lisinopril (PRINIVIL,ZESTRIL) 2.5 MG tablet Take 5 mg by mouth daily.      . metFORMIN (GLUCOPHAGE) 1000 MG tablet Take 1,000 mg by mouth 2 (two) times daily with a meal.      . niacin (NIASPAN) 500 MG CR tablet Take 1 tablet (500 mg total) by mouth 2 (two) times daily.  180 tablet  3  . omega-3 acid ethyl esters (LOVAZA) 1 G capsule Take 2 capsules (2 g total) by mouth 2 (two) times daily.  360 capsule  3  . omeprazole (PRILOSEC) 20 MG capsule Take 1 capsule (20 mg total) by mouth daily.  30 capsule  2  . promethazine (PHENERGAN) 25 MG tablet Take 1 tablet (25 mg total) by mouth every 6 (six) hours as needed for nausea or vomiting.  12 tablet  0  . Simethicone (GAS-X EXTRA STRENGTH PO) Take 1 tablet by mouth 3 (three) times daily as needed (gas).      Marland Kitchen tiotropium (SPIRIVA HANDIHALER) 18 MCG inhalation capsule Place 1 capsule (18 mcg  total) into inhaler and inhale daily.  90 capsule  4   No current facility-administered medications for this visit.   Family History  Problem Relation Age of Onset  . Colon polyps Mother     benign  . Cancer Mother     ovarian cancer  . Cancer Maternal Aunt     breast cancer, ovarian cancer  . Diabetes Maternal Uncle   . Cancer Father     Prostate Cancer    History   Social History  . Marital Status: Married    Spouse Name: N/A    Number of Children: N/A  . Years of Education: N/A   Social History Main Topics  . Smoking status: Current Every Day Smoker -- 1.50 packs/day for 16 years    Types: Cigarettes  . Smokeless tobacco: Never Used  . Alcohol Use: No     Comment: Seldom  . Drug Use: No  . Sexual Activity: Not Currently   Other Topics Concern  . None   Social History Narrative   Drinks at least a pot of coffee every day.    Review of Systems: Constitutional: Denies fever or chills.  Respiratory: +SOB Cardiovascular: Denies chest pain or leg swelling.  Gastrointestinal: +nausea and abdominal distention, improved.  Genitourinary: Denies dysuria, urgency, frequency Musculoskeletal: Denies myalgias or arthralgias.  Skin: Denies rash or wound.  Neurological: Denies dizziness, weakness, or numbness.  Psychiatric/Behavioral: Denies mood changes or confusion. +depression over father's death  Objective:  Physical Exam: Filed Vitals:   03/11/14 1354  BP: 138/69  Pulse: 98  Temp: 98.2 F (36.8 C)  TempSrc: Oral  Height: 5\' 2"  (1.575 m)  Weight: 163 lb 3.2 oz (74.027 kg)  SpO2: 95%   Constitutional: Vital signs reviewed.  Patient is a well-developed and well-nourished female in no acute distress and cooperative with exam. Alert and oriented x3. Smells like cigarette smoke. Head: Normocephalic and atraumatic Eyes: PERRL, EOMI. No scleral icterus.  Cardiovascular: RRR, no MRG Pulmonary/Chest: Normal respiratory effort, CTAB, no wheezes, rales, or  rhonchi Abdominal: Soft. Non-tender, +distended, bowel sounds are normal Musculoskeletal: No joint deformities or erythema Neurological: A&O x3, CN 2-12 grossly intact, no focal deficits Skin: Warm, dry and intact. Psychiatric: Normal mood and affect. speech and behavior is normal. She looks good today, appears happy.   Assessment & Plan:   Please refer to Problem List based Assessment  and Plan

## 2014-03-11 NOTE — Progress Notes (Signed)
INTERNAL MEDICINE TEACHING ATTENDING ADDENDUM - Cason Dabney, MD: I reviewed and discussed at the time of visit with the resident Dr. Glenn, the patient's medical history, physical examination, diagnosis and results of pertinent tests and treatment and I agree with the patient's care as documented.  

## 2014-03-11 NOTE — Patient Instructions (Signed)
**  Continue to keep a log of you blood sugars, checking 2 times a day before breakfast and dinner.   **Please bring your log book with you to your next visit with me in 2 months.   General Instructions:   Please bring your medicines with you each time you come to clinic.  Medicines may include prescription medications, over-the-counter medications, herbal remedies, eye drops, vitamins, or other pills.   Progress Toward Treatment Goals:  Treatment Goal 03/11/2014  Hemoglobin A1C unchanged  Blood pressure at goal  Stop smoking smoking the same amount    Self Care Goals & Plans:  Self Care Goal 03/11/2014  Manage my medications take my medicines as prescribed; bring my medications to every visit; refill my medications on time; follow the sick day instructions if I am sick  Monitor my health keep track of my weight; keep track of my blood glucose; check my feet daily  Eat healthy foods eat more vegetables; eat fruit for snacks and desserts; eat smaller portions; drink diet soda or water instead of juice or soda  Be physically active find an activity I enjoy  Stop smoking -    Home Blood Glucose Monitoring 02/05/2014  Check my blood sugar once a day  When to check my blood sugar before meals     Care Management & Community Referrals:  No flowsheet data found.

## 2014-03-11 NOTE — Assessment & Plan Note (Signed)
BP Readings from Last 3 Encounters:  03/11/14 138/69  02/05/14 119/79  10/27/13 131/79    Lab Results  Component Value Date   NA 135* 10/11/2013   K 4.4 10/11/2013   CREATININE 0.41* 10/11/2013    Assessment: Blood pressure control: controlled Progress toward BP goal:  at goal Comments: Pt did not take her lisinopril this morning  Plan: Medications:  continue current medications

## 2014-03-11 NOTE — Assessment & Plan Note (Signed)
Refilled Spiriva today.

## 2014-03-11 NOTE — Progress Notes (Signed)
Spiriva called in to [facility] per Dr Eulas Post. Hilda Blades Jaron Czarnecki RN 03/11/14 2:55PM

## 2014-03-11 NOTE — Assessment & Plan Note (Signed)
  Assessment: Progress toward smoking cessation:  smoking the same amount Barriers to progress toward smoking cessation:  stress Comments: Still smoking 1 -1 1/2 ppd depending on her stress level and mood. She is not ready to quit but is contemplating cessation.   Plan: Instruction/counseling given:  I counseled patient on the dangers of tobacco use, advised patient to stop smoking, and reviewed strategies to maximize success. Educational resources provided:  [employer] account manager) brochure

## 2014-03-23 ENCOUNTER — Encounter: Payer: Self-pay | Admitting: *Deleted

## 2014-03-25 ENCOUNTER — Ambulatory Visit (HOSPITAL_COMMUNITY): Payer: Self-pay | Admitting: Psychiatry

## 2014-03-26 ENCOUNTER — Telehealth: Payer: Self-pay | Admitting: Licensed Clinical Social Worker

## 2014-03-26 ENCOUNTER — Encounter: Payer: Self-pay | Admitting: Licensed Clinical Social Worker

## 2014-03-26 NOTE — Telephone Encounter (Signed)
"  To be honest, I don't think I need to see someone".  Pt states she has been dealing with anxiety all her life.  Vanessa [name] canceled her [facility] appointment due to lack of funds for visit.  Pt is uninsured and does not qualify for [employer].  CSW discuss community options [facility] [place].  Pt has no interest in [facility] and will think about [facility].  CSW will send Vanessa [name] information in the mail.  [facility] walk-in hours and is aware PCP is recommending behavioral health services.  Letter mailed with information on [facility].  CSW will sign off.

## 2014-04-27 ENCOUNTER — Encounter: Payer: Self-pay | Admitting: Internal Medicine

## 2014-05-05 ENCOUNTER — Other Ambulatory Visit: Payer: Self-pay | Admitting: *Deleted

## 2014-05-07 ENCOUNTER — Encounter: Payer: Self-pay | Admitting: Internal Medicine

## 2014-05-07 MED ORDER — METFORMIN HCL 1000 MG PO TABS: 1000.0000 mg | ORAL_TABLET | Freq: Two times a day (BID) | ORAL | Status: DC

## 2014-05-25 ENCOUNTER — Ambulatory Visit (INDEPENDENT_AMBULATORY_CARE_PROVIDER_SITE_OTHER): Payer: Self-pay | Admitting: Internal Medicine

## 2014-05-25 ENCOUNTER — Encounter: Payer: Self-pay | Admitting: Internal Medicine

## 2014-05-25 VITALS — BP 126/64 | HR 118 | Temp 98.2°F | Resp 20 | Ht 62.5 in | Wt 164.3 lb

## 2014-05-25 DIAGNOSIS — M7122 Synovial cyst of popliteal space [Baker], left knee: Secondary | ICD-10-CM

## 2014-05-25 DIAGNOSIS — F418 Other specified anxiety disorders: Secondary | ICD-10-CM

## 2014-05-25 DIAGNOSIS — I10 Essential (primary) hypertension: Secondary | ICD-10-CM

## 2014-05-25 DIAGNOSIS — E781 Pure hyperglyceridemia: Secondary | ICD-10-CM

## 2014-05-25 DIAGNOSIS — F329 Major depressive disorder, single episode, unspecified: Secondary | ICD-10-CM

## 2014-05-25 DIAGNOSIS — Z720 Tobacco use: Secondary | ICD-10-CM

## 2014-05-25 DIAGNOSIS — E119 Type 2 diabetes mellitus without complications: Secondary | ICD-10-CM

## 2014-05-25 DIAGNOSIS — F341 Dysthymic disorder: Secondary | ICD-10-CM

## 2014-05-25 DIAGNOSIS — M7120 Synovial cyst of popliteal space [Baker], unspecified knee: Secondary | ICD-10-CM

## 2014-05-25 DIAGNOSIS — Z0000 Encounter for general adult medical examination without abnormal findings: Secondary | ICD-10-CM

## 2014-05-25 DIAGNOSIS — F17200 Nicotine dependence, unspecified, uncomplicated: Secondary | ICD-10-CM

## 2014-05-25 DIAGNOSIS — F419 Anxiety disorder, unspecified: Secondary | ICD-10-CM

## 2014-05-25 DIAGNOSIS — Z794 Long term (current) use of insulin: Secondary | ICD-10-CM

## 2014-05-25 LAB — GLUCOSE, CAPILLARY: Glucose-Capillary: 293 mg/dL — ABNORMAL HIGH (ref 70–99)

## 2014-05-25 LAB — POCT GLYCOSYLATED HEMOGLOBIN (HGB A1C): HEMOGLOBIN A1C: 8.5

## 2014-05-25 MED ORDER — ALPRAZOLAM 0.5 MG PO TABS: ORAL_TABLET | ORAL | Status: DC

## 2014-05-25 MED ORDER — PRAVASTATIN SODIUM 40 MG PO TABS: 40.0000 mg | ORAL_TABLET | Freq: Every evening | ORAL | Status: DC

## 2014-05-25 NOTE — Progress Notes (Signed)
Patient ID: Vanessa [name], female   DOB: Apr 20, 1961, 53 y.o.   MRN: [id number]  Subjective:   Patient ID: Vanessa [name] female   DOB: March 17, 1961 53 y.o.   MRN: [id number]  HPI: Vanessa [name] is a 53 y.o. F w/ PMH Asthma, poorly controlled DM2, Hypertriglyceridemia, GERD, depression, and anxiety who presents for a routine f/u.  Our CSW called the patient to schedule an appointment with behavioral health for her depression and anxiety, but the patient declined stating that she did not feel that she needed to be seen by a behavioral specialist. She continues to take her Xanax and Cymbalta and is feeling better.   She was asked to bring a copy of her glucometer log or bring in the meter itself at her last clinic visit. Unfortunately, she did not bring either in today. She denies any readings lower than 70 and is checking her CBGs TID. She states that her CBGs have been around 180s-200 but have been more elevated with Thanksgiving. She is not checking her CBGs before meals but is only checking it after.   She endorses compliance with her medications but is not taking her statin, which fell off her list per Pharmacy Tech review a few months ago.    Past Medical History  Diagnosis Date  . Asthma     No PFT performed  . Diabetes mellitus 2002  . Depression   . Panic attacks     mostly Agaraphobia  . GERD (gastroesophageal reflux disease)   . Helicobacter pylori (H. pylori) infection     s/p triple therapy  . Hyperlipidemia   . Hepatic hemangioma   . Hypertriglyceridemia    Current Outpatient Prescriptions  Medication Sig Dispense Refill  . albuterol (PROVENTIL HFA;VENTOLIN HFA) 108 (90 BASE) MCG/ACT inhaler Inhale 1-2 puffs into the lungs every 4 (four) hours as needed for wheezing. 9 Inhaler 4  . ALPRAZolam (XANAX) 0.5 MG tablet TAKE ONE TABLET BY MOUTH THREE TIMES DAILY AS NEEDED FOR SLEEP OR  ANXIETY 90 tablet 0  . aspirin 81 MG tablet Take 1 tablet (81 mg total) by mouth daily. 30 tablet 2   . DULoxetine (CYMBALTA) 60 MG capsule Take 1 capsule (60 mg total) by mouth 2 (two) times daily. 60 capsule 11  . fenofibrate (TRICOR) 145 MG tablet Take 1 tablet (145 mg total) by mouth daily. 90 tablet 3  . Fluticasone-Salmeterol (ADVAIR DISKUS) 500-50 MCG/DOSE AEPB Inhale 1 puff into the lungs every 12 (twelve) hours. 2 each 6  . insulin glargine (LANTUS) 100 UNIT/ML injection Inject 45 in the morning and 35 in the evening . 24 mL 11  . lisinopril (PRINIVIL,ZESTRIL) 5 MG tablet Take 1 tablet (5 mg total) by mouth daily. 30 tablet 11  . metFORMIN (GLUCOPHAGE) 1000 MG tablet Take 1 tablet (1,000 mg total) by mouth 2 (two) times daily with a meal. 180 tablet 3  . niacin (NIASPAN) 500 MG CR tablet Take 1 tablet (500 mg total) by mouth 2 (two) times daily. 180 tablet 3  . omega-3 acid ethyl esters (LOVAZA) 1 G capsule Take 2 capsules (2 g total) by mouth 2 (two) times daily. 360 capsule 3  . tiotropium (SPIRIVA HANDIHALER) 18 MCG inhalation capsule Place 1 capsule (18 mcg total) into inhaler and inhale daily. 90 capsule 11  . acetaminophen (TYLENOL) 500 MG tablet Take 1,000 mg by mouth every 6 (six) hours as needed for mild pain.    . diphenhydrAMINE (BENADRYL) 25 mg capsule Take 50  mg by mouth every 6 (six) hours as needed for allergies.    Marland Kitchen ibuprofen (ADVIL,MOTRIN) 200 MG tablet Take 600 mg by mouth every 6 (six) hours as needed for moderate pain.    Marland Kitchen omeprazole (PRILOSEC) 20 MG capsule Take 1 capsule (20 mg total) by mouth daily. (Patient not taking: Reported on 05/25/2014) 30 capsule 2  . pravastatin (PRAVACHOL) 40 MG tablet Take 1 tablet (40 mg total) by mouth every evening. 30 tablet 11  . prochlorperazine (COMPAZINE) 10 MG tablet Take 10 mg by mouth every 6 (six) hours as needed for nausea or vomiting.    . Simethicone (GAS-X EXTRA STRENGTH PO) Take 1 tablet by mouth 3 (three) times daily as needed (gas).     No current facility-administered medications for this visit.   Family History   Problem Relation Age of Onset  . Colon polyps Mother     benign  . Cancer Mother     ovarian cancer  . Cancer Maternal Aunt     breast cancer, ovarian cancer  . Diabetes Maternal Uncle   . Cancer Father     Prostate Cancer    History   Social History  . Marital Status: Married    Spouse Name: N/A    Number of Children: N/A  . Years of Education: N/A   Social History Main Topics  . Smoking status: Current Every Day Smoker -- 0.50 packs/day for 16 years    Types: Cigarettes  . Smokeless tobacco: Never Used     Comment: trying to cut back to 1/2 ppd  . Alcohol Use: No     Comment: Seldom  . Drug Use: No  . Sexual Activity: Not Currently   Other Topics Concern  . None   Social History Narrative   Drinks at least a pot of coffee every day.    Review of Systems:  Constitutional: Denies fever or chills.  HEENT: +Intermittent left ear pain Respiratory: +Intermittent SOB Cardiovascular: Denies chest pain or leg swelling.  Gastrointestinal: +Intermittent nausea and abdominal pain; distention, improved.  Genitourinary: Denies dysuria, urgency, frequency Musculoskeletal: Denies myalgias or arthralgias.  Skin: Denies rash or wound.  Neurological: Denies dizziness, weakness, or numbness. +tingling in her feet bilaterally.  Psychiatric/Behavioral: Denies mood changes or confusion. +depression after father's death, but improving  Objective:  Physical Exam: Filed Vitals:   05/25/14 1025  BP: 126/64  Pulse: 118  Temp: 98.2 F (36.8 C)  TempSrc: Oral  Resp: 20  Height: 5' 2.5" (1.588 m)  Weight: 164 lb 4.8 oz (74.526 kg)  SpO2: 93%   Constitutional: Vital signs reviewed. Patient is a well-developed and well-nourished female in no acute distress and cooperative with exam. Alert and oriented x3. Odor of cigarette smoke is present. Head: Normocephalic and atraumatic. TM intact bilaterally with no erythema of the canal. Cerumen present in left auditory canal Eyes: PERRL,  EOMI. No scleral icterus, +injection bilatetrally. Cardiovascular: RRR, no MRG Pulmonary/Chest: Normal respiratory effort, CTAB, no wheezes, rales, or rhonchi Abdominal: Soft. Non-tender, +distended, bowel sounds are normal Musculoskeletal: No joint deformities or erythema Neurological: A&O x3, CN 2-12 grossly intact, no focal deficits Skin: Warm, dry and intact. +larger Baker's cyst in left popliteal fossa, nontender.  Psychiatric: Somewhat depressed mood and flattened affect. Speech  is normal.   Assessment & Plan:   Please refer to Problem List based Assessment and Plan

## 2014-05-25 NOTE — Assessment & Plan Note (Addendum)
Last TG level 1419 09/2013. She continues to take the Niacin and Tricor. Not taking the statin. Will restart Pravastatin and check lipid panel after the Holidays. Encouraged avoiding rich, fried, and greasy foods over the holidays.

## 2014-05-25 NOTE — Assessment & Plan Note (Addendum)
Cyst increasing in size, and pt is having some pain with it. She still does not have insurance, but will hopefully get on a plan later this month. At next visit will reassess. She would likely benefit from intraarticular steroid injection.

## 2014-05-25 NOTE — Assessment & Plan Note (Signed)
BP Readings from Last 3 Encounters:  05/25/14 126/64  03/11/14 138/69  02/05/14 119/79    Lab Results  Component Value Date   NA 135* 10/11/2013   K 4.4 10/11/2013   CREATININE 0.41* 10/11/2013    Assessment: Blood pressure control: controlled Progress toward BP goal:  at goal Comments: She is taking only Lisinopril 5mg  daily  Plan: Medications:  continue current medications

## 2014-05-25 NOTE — Assessment & Plan Note (Signed)
  Assessment: Progress toward smoking cessation:  smoking the same amount Barriers to progress toward smoking cessation:  stress Comments: Smokes 1/2 -1ppd  Plan: Instruction/counseling given:  I counseled patient on the dangers of tobacco use, advised patient to stop smoking, and reviewed strategies to maximize success. Educational resources provided:  smoking cessation handout (tips, strategies, fact sheets) Self management tools provided:  smoking cessation plan ([employer] Quit Plan)  Patient agreed to the following self-care plans for smoking cessation: cut down the number of cigarettes smoked

## 2014-05-25 NOTE — Patient Instructions (Signed)
**  We are restarting the Pravastatin today and will recheck your cholesterol and triglycerides at your next visit.   **If you develop muscle aches after starting the Pravastatin, please call the clinic.   **Work on [employer] during [employer] and try to keep your sugars low.   General Instructions:   Please bring your medicines with you each time you come to clinic.  Medicines may include prescription medications, over-the-counter medications, herbal remedies, eye drops, vitamins, or other pills.   Progress Toward Treatment Goals:  Treatment Goal 05/25/2014  Hemoglobin A1C improved  Blood pressure at goal  Stop smoking smoking the same amount    Self Care Goals & Plans:  Self Care Goal 05/25/2014  Manage my medications take my medicines as prescribed; bring my medications to every visit; refill my medications on time  Monitor my health keep track of my blood glucose  Eat healthy foods eat fruit for snacks and desserts; eat baked foods instead of fried foods; drink diet soda or water instead of juice or soda  Be physically active find an activity I enjoy  Stop smoking cut down the number of cigarettes smoked    Home Blood Glucose Monitoring 05/25/2014  Check my blood sugar 3 times a day  When to check my blood sugar after breakfast; after dinner     Care Management & Community Referrals:  No flowsheet data found.

## 2014-05-25 NOTE — Assessment & Plan Note (Addendum)
Lab Results  Component Value Date   HGBA1C 8.5 05/25/2014   HGBA1C 9.2 02/05/2014   HGBA1C 9.9 09/25/2013     Assessment: Diabetes control: fair control Progress toward A1C goal:  improved Comments: Pt checking CBGs after meals with reported post-prandial CBGs 180-200.  Plan: Medications:  continue current medications including Metformin 1000mg  BID and Lantus 45u qam and 35u qhs. Home glucose monitoring: Frequency: 3 times a day Timing: after breakfast, after dinner Instruction/counseling given: reminded to bring blood glucose meter & log to each visit, reminded to bring medications to each visit, discussed diet and other instruction/counseling: including checking her CBGs before meals, recording the CBGs since she shares a meter with her husband, and bringing the log book in with her to her next visit in approx 1 mo.  Educational resources provided: brochure Self management tools provided: home glucose logbook Other plans: F/u 1 mo for diabetes check. Not changing Lantus at this time b/c unsure of CBGs before meals. She may benefit from mealtime insulin coverage as well. Not sure if she would be a candidate for TID mealtime coverage as she does not eat regular meals TID, but possibly lunch time coverage would be beneficial.

## 2014-05-25 NOTE — Progress Notes (Signed)
Internal Medicine Clinic Attending  Case discussed with Dr. Glenn soon after the resident saw the patient.  We reviewed the resident's history and exam and pertinent patient test results.  I agree with the assessment, diagnosis, and plan of care documented in the resident's note. 

## 2014-05-25 NOTE — Assessment & Plan Note (Addendum)
Still with significant social stressors. Depression and anxiety are better with Cymbalta. Pt requesting refill of Xanax today. Last time it was filled was in Sept.  - Refilled Xanax 0.5mg  TID, disp #90, 0 refills

## 2014-06-23 ENCOUNTER — Other Ambulatory Visit: Payer: Self-pay | Admitting: *Deleted

## 2014-06-23 DIAGNOSIS — E781 Pure hyperglyceridemia: Secondary | ICD-10-CM

## 2014-06-23 DIAGNOSIS — E119 Type 2 diabetes mellitus without complications: Secondary | ICD-10-CM

## 2014-06-23 DIAGNOSIS — F341 Dysthymic disorder: Secondary | ICD-10-CM

## 2014-06-23 MED ORDER — ALPRAZOLAM 0.5 MG PO TABS: ORAL_TABLET | ORAL | Status: DC

## 2014-06-23 MED ORDER — INSULIN GLARGINE 100 UNIT/ML ~~LOC~~ SOLN: SUBCUTANEOUS | Status: DC

## 2014-06-23 MED ORDER — OMEGA-3-ACID ETHYL ESTERS 1 G PO CAPS: 2.0000 g | ORAL_CAPSULE | Freq: Two times a day (BID) | ORAL | Status: DC

## 2014-06-24 NOTE — Telephone Encounter (Signed)
Called to pharm 

## 2014-07-02 ENCOUNTER — Encounter: Payer: Self-pay | Admitting: Internal Medicine

## 2014-07-09 ENCOUNTER — Encounter: Payer: Self-pay | Admitting: Internal Medicine

## 2014-08-13 ENCOUNTER — Other Ambulatory Visit: Payer: Self-pay | Admitting: *Deleted

## 2014-08-13 DIAGNOSIS — E781 Pure hyperglyceridemia: Secondary | ICD-10-CM

## 2014-08-13 DIAGNOSIS — E119 Type 2 diabetes mellitus without complications: Secondary | ICD-10-CM

## 2014-08-14 MED ORDER — INSULIN GLARGINE 100 UNIT/ML ~~LOC~~ SOLN: SUBCUTANEOUS | Status: DC

## 2014-08-14 MED ORDER — OMEGA-3-ACID ETHYL ESTERS 1 G PO CAPS: 2.0000 g | ORAL_CAPSULE | Freq: Two times a day (BID) | ORAL | Status: DC

## 2014-08-27 ENCOUNTER — Encounter: Payer: Self-pay | Admitting: Internal Medicine

## 2014-09-03 ENCOUNTER — Ambulatory Visit: Payer: Self-pay | Admitting: Internal Medicine

## 2014-09-07 ENCOUNTER — Telehealth: Payer: Self-pay | Admitting: Internal Medicine

## 2014-09-07 NOTE — Telephone Encounter (Signed)
Call to patient to confirm appointment for 09/08/14 at 9:45. No answer home is disconnected

## 2014-09-08 ENCOUNTER — Ambulatory Visit (INDEPENDENT_AMBULATORY_CARE_PROVIDER_SITE_OTHER): Payer: Self-pay | Admitting: Internal Medicine

## 2014-09-08 ENCOUNTER — Encounter: Payer: Self-pay | Admitting: Internal Medicine

## 2014-09-08 VITALS — BP 140/65 | HR 96 | Temp 98.2°F | Ht 62.5 in | Wt 162.4 lb

## 2014-09-08 DIAGNOSIS — F418 Other specified anxiety disorders: Secondary | ICD-10-CM

## 2014-09-08 DIAGNOSIS — E781 Pure hyperglyceridemia: Secondary | ICD-10-CM

## 2014-09-08 DIAGNOSIS — F32A Depression, unspecified: Secondary | ICD-10-CM

## 2014-09-08 DIAGNOSIS — F17200 Nicotine dependence, unspecified, uncomplicated: Secondary | ICD-10-CM

## 2014-09-08 DIAGNOSIS — Z0000 Encounter for general adult medical examination without abnormal findings: Secondary | ICD-10-CM

## 2014-09-08 DIAGNOSIS — K589 Irritable bowel syndrome without diarrhea: Secondary | ICD-10-CM

## 2014-09-08 DIAGNOSIS — I10 Essential (primary) hypertension: Secondary | ICD-10-CM

## 2014-09-08 DIAGNOSIS — E785 Hyperlipidemia, unspecified: Secondary | ICD-10-CM

## 2014-09-08 DIAGNOSIS — F329 Major depressive disorder, single episode, unspecified: Secondary | ICD-10-CM

## 2014-09-08 DIAGNOSIS — F17210 Nicotine dependence, cigarettes, uncomplicated: Secondary | ICD-10-CM

## 2014-09-08 DIAGNOSIS — Z794 Long term (current) use of insulin: Secondary | ICD-10-CM

## 2014-09-08 DIAGNOSIS — F419 Anxiety disorder, unspecified: Secondary | ICD-10-CM

## 2014-09-08 DIAGNOSIS — E119 Type 2 diabetes mellitus without complications: Secondary | ICD-10-CM

## 2014-09-08 LAB — POCT GLYCOSYLATED HEMOGLOBIN (HGB A1C): Hemoglobin A1C: 8.5

## 2014-09-08 LAB — GLUCOSE, CAPILLARY: Glucose-Capillary: 155 mg/dL — ABNORMAL HIGH (ref 70–99)

## 2014-09-08 MED ORDER — PRAVASTATIN SODIUM 20 MG PO TABS: 20.0000 mg | ORAL_TABLET | Freq: Every evening | ORAL | Status: AC

## 2014-09-08 MED ORDER — ALPRAZOLAM 0.5 MG PO TABS: ORAL_TABLET | ORAL | Status: DC

## 2014-09-08 NOTE — Assessment & Plan Note (Addendum)
Pravastatin 40mg  daily cause body aches, so the patient stopped taking the medication. She is out of the Niacin and [facility] no longer carries it. Was planning to check a lipid panel today, but I suspect that her cholesterol and TG will be elevated since she has been off her meds.   - Will decrease dose of Pravastain to 20mg  daily - Pt asked to call the clinic if she again develops body aches on the statin - Pt to start taking OTC Vit B3 500mg  BID in place of Nicain brand.  - F/u in 1 mo for lipid panel

## 2014-09-08 NOTE — Assessment & Plan Note (Signed)
BP Readings from Last 3 Encounters:  09/08/14 140/65  05/25/14 126/64  03/11/14 138/69    Lab Results  Component Value Date   NA 135* 10/11/2013   K 4.4 10/11/2013   CREATININE 0.41* 10/11/2013    Assessment: Blood pressure control: controlled Progress toward BP goal:  at goal Comments: Pt out of lisinoptil 5mg  and need to pick it up from the health dept  Plan: Medications:  continue current medications Educational resources provided: brochure (denies) Self management tools provided:   Other plans: Encouraged pt to pick up her lisinopril. F/u in 1 mo

## 2014-09-08 NOTE — Assessment & Plan Note (Addendum)
Anxiety is stable, her last prescription for the Xanax was in December.  - Refilling Xanax 0.5mg  po TID, disp #90, 0 refills

## 2014-09-08 NOTE — Patient Instructions (Addendum)
Restart the Pravastatin but at a lower dose of 20mg  daily. If you begin having the same pains, please call the clinic. You can begin taking Vit B3 500mg  2 times a day in place of the Niacin.   Return to the clinic in 1 month for a recheck of your cholesterol. Please bring your meter with you at that time.   General Instructions:   Please bring your medicines with you each time you come to clinic.  Medicines may include prescription medications, over-the-counter medications, herbal remedies, eye drops, vitamins, or other pills.   Progress Toward Treatment Goals:  Treatment Goal 05/25/2014  Hemoglobin A1C improved  Blood pressure at goal  Stop smoking smoking the same amount    Self Care Goals & Plans:  Self Care Goal 09/08/2014  Manage my medications take my medicines as prescribed; bring my medications to every visit; refill my medications on time  Monitor my health -  Eat healthy foods drink diet soda or water instead of juice or soda; eat more vegetables; eat foods that are low in salt; eat baked foods instead of fried foods  Be physically active -  Stop smoking -    Home Blood Glucose Monitoring 05/25/2014  Check my blood sugar 3 times a day  When to check my blood sugar after breakfast; after dinner     Care Management & Community Referrals:  No flowsheet data found.

## 2014-09-08 NOTE — Assessment & Plan Note (Addendum)
Lab Results  Component Value Date   HGBA1C 8.5 09/08/2014   HGBA1C 8.5 05/25/2014   HGBA1C 9.2 02/05/2014     Assessment: Diabetes control: fair control Progress toward A1C goal:  unchanged Comments: Pt taking Metformin 1000mg  BID and Lantus 45u qam and 35u qhs. She did not bring in her meter today, but states that her CBGs have been fairly well controlled 120s- low 200s.  Plan: Medications:  continue current medications. She was asked to bring her meter with her next visit to make adjustments to her insulin where it is needed most. Home glucose monitoring: Frequency: 2 times a day Timing: before meals Instruction/counseling given: reminded to bring blood glucose meter & log to each visit, reminded to bring medications to each visit and discussed diet Educational resources provided: brochure (denies) Self management tools provided:   Other plans: F/u in 1 mo and bring meter.

## 2014-09-08 NOTE — Progress Notes (Signed)
Internal Medicine Clinic Attending  Case discussed with Dr. Glenn soon after the resident saw the patient.  We reviewed the resident's history and exam and pertinent patient test results.  I agree with the assessment, diagnosis, and plan of care documented in the resident's note. 

## 2014-09-08 NOTE — Assessment & Plan Note (Signed)
Pravastatin 40mg  daily cause body aches, so the patient stopped taking the medication. She is out of the Niacin. Was planning to check a lipid panel today, but I suspect that her cholesterol and TG will be elevated.  - Will decrease dose of Pravastain to 20mg  daily - Pt to start taking B3 in place of Nicain brand.  - F/u in 1 mo for lipid panel

## 2014-09-08 NOTE — Assessment & Plan Note (Signed)
  Assessment: Progress toward smoking cessation:  smoking the same amount Barriers to progress toward smoking cessation:  stress Comments: Smoking 1/2-1ppd  Plan: Instruction/counseling given:  I counseled patient on the dangers of tobacco use, advised patient to stop smoking, and reviewed strategies to maximize success. Educational resources provided:  [employer] account manager) brochure (denies)   Pt not ready to quit.

## 2014-09-08 NOTE — Assessment & Plan Note (Signed)
Still with abd pain and bloating with intermittent diarrhea and stomach pain with eating. Suspect that she may have gastroparesis from her diabetes as well. She needs to be referred to GI for a colonoscopy and for evaluation for her abd distension, bloating, and possible gastroparesis. She has applied for Medicaid and has recently sent in the paperwork. I am hopeful that she will get her Medicaid soon and we can then make the referral. The pt does not want the referral to be placed at this time, b/c she is not able to afford to pay out of pocket for the visit. - GI referral once she gets her insurance

## 2014-09-08 NOTE — Progress Notes (Signed)
Patient ID: Vanessa [name], female   DOB: 05/16/1961, 54 y.o.   MRN: [id number]  Subjective:   Patient ID: Vanessa [name] female   DOB: [birth year] 54 y.o.   MRN: [id number]  HPI: Vanessa [name] is a 54 y.o. F w/ PMH Asthma, poorly controlled DM2, Hypertriglyceridemia, GERD, depression, and anxiety who presents for a routine f/u.  She states that her CBGs have been better and are generally around 120s-low 200s. Generally they do not get above 200. She denies any lows lower than 120s. She did not bring in her meter today.   She states that she is out of her lisinopril 5mg .  She is still having abdominal bloating with intermittent diarrhea and constipation that has been occurring primarily after she eats with pain in her stomach.    Past Medical History  Diagnosis Date  . Asthma     No PFT performed  . Diabetes mellitus 2002  . Depression   . Panic attacks     mostly Agaraphobia  . GERD (gastroesophageal reflux disease)   . Helicobacter pylori (H. pylori) infection     s/p triple therapy  . Hyperlipidemia   . Hepatic hemangioma   . Hypertriglyceridemia    Current Outpatient Prescriptions  Medication Sig Dispense Refill  . acetaminophen (TYLENOL) 500 MG tablet Take 1,000 mg by mouth every 6 (six) hours as needed for mild pain.    Marland Kitchen albuterol (PROVENTIL HFA;VENTOLIN HFA) 108 (90 BASE) MCG/ACT inhaler Inhale 1-2 puffs into the lungs every 4 (four) hours as needed for wheezing. 9 Inhaler 4  . ALPRAZolam (XANAX) 0.5 MG tablet TAKE ONE TABLET BY MOUTH THREE TIMES DAILY AS NEEDED FOR SLEEP OR  ANXIETY 90 tablet 0  . aspirin 81 MG tablet Take 1 tablet (81 mg total) by mouth daily. 30 tablet 2  . diphenhydrAMINE (BENADRYL) 25 mg capsule Take 50 mg by mouth every 6 (six) hours as needed for allergies.    . DULoxetine (CYMBALTA) 60 MG capsule Take 1 capsule (60 mg total) by mouth 2 (two) times daily. 60 capsule 11  . fenofibrate (TRICOR) 145 MG tablet Take 1 tablet (145 mg total) by mouth daily. 90  tablet 3  . Fluticasone-Salmeterol (ADVAIR DISKUS) 500-50 MCG/DOSE AEPB Inhale 1 puff into the lungs every 12 (twelve) hours. 2 each 6  . ibuprofen (ADVIL,MOTRIN) 200 MG tablet Take 600 mg by mouth every 6 (six) hours as needed for moderate pain.    Marland Kitchen insulin glargine (LANTUS) 100 UNIT/ML injection Inject 45 in the morning and 35 in the evening . 270 mL 3  . lisinopril (PRINIVIL,ZESTRIL) 5 MG tablet Take 1 tablet (5 mg total) by mouth daily. 30 tablet 11  . metFORMIN (GLUCOPHAGE) 1000 MG tablet Take 1 tablet (1,000 mg total) by mouth 2 (two) times daily with a meal. 180 tablet 3  . niacin (NIASPAN) 500 MG CR tablet Take 1 tablet (500 mg total) by mouth 2 (two) times daily. 180 tablet 3  . omega-3 acid ethyl esters (LOVAZA) 1 G capsule Take 2 capsules (2 g total) by mouth 2 (two) times daily. 360 capsule 3  . omeprazole (PRILOSEC) 20 MG capsule Take 1 capsule (20 mg total) by mouth daily. (Patient not taking: Reported on 05/25/2014) 30 capsule 2  . pravastatin (PRAVACHOL) 40 MG tablet Take 1 tablet (40 mg total) by mouth every evening. 30 tablet 11  . prochlorperazine (COMPAZINE) 10 MG tablet Take 10 mg by mouth every 6 (six) hours as needed for nausea  or vomiting.    . Simethicone (GAS-X EXTRA STRENGTH PO) Take 1 tablet by mouth 3 (three) times daily as needed (gas).    Marland Kitchen tiotropium (SPIRIVA HANDIHALER) 18 MCG inhalation capsule Place 1 capsule (18 mcg total) into inhaler and inhale daily. 90 capsule 11   No current facility-administered medications for this visit.   Family History  Problem Relation Age of Onset  . Colon polyps Mother     benign  . Cancer Mother     ovarian cancer  . Cancer Maternal Aunt     breast cancer, ovarian cancer  . Diabetes Maternal Uncle   . Cancer Father     Prostate Cancer    History   Social History  . Marital Status: Married    Spouse Name: N/A  . Number of Children: N/A  . Years of Education: N/A   Social History Main Topics  . Smoking status:  Current Every Day Smoker -- 0.50 packs/day for 16 years    Types: Cigarettes  . Smokeless tobacco: Never Used     Comment: trying to cut back to 1/2 ppd  . Alcohol Use: No     Comment: Seldom  . Drug Use: No  . Sexual Activity: Not Currently   Other Topics Concern  . None   Social History Narrative   Drinks at least a pot of coffee every day.    Review of Systems: A 12 point ROS was performed; pertinent positives and negatives were noted in the HPI   Objective:  Physical Exam: Filed Vitals:   09/08/14 0952  BP: 140/65  Pulse: 96  Temp: 98.2 F (36.8 C)  TempSrc: Oral  Height: 5' 2.5" (1.588 m)  Weight: 162 lb 6.4 oz (73.664 kg)  SpO2: 100%   Constitutional: Vital signs reviewed.  Patient is a well-developed and well-nourished female in no acute distress and cooperative with exam. Head: Normocephalic and atraumatic Eyes: PERRL, EOMI. No scleral icterus, +injection.  Neck: Supple, normal ROM Cardiovascular: RRR, no MRG Pulmonary/Chest: Normal respiratory effort, CTAB, no wheezes, rales, or rhonchi Abdominal: Soft. Bowel sounds are normal, no masses, organomegaly, or guarding present. +diffuse tenderness across the mid-lower abdomen with distension, non-tympanitic.  Musculoskeletal: No joint deformities, erythema, or stiffness Neurological: A&O x3, cranial nerve II-XII are grossly intact, no focal motor deficit.  Skin: Warm, dry and intact.  Psychiatric: Normal mood and affect. Speech and behavior is normal.   Assessment & Plan:   Please refer to Problem List based Assessment and Plan

## 2014-09-08 NOTE — Assessment & Plan Note (Signed)
Still holding on colonoscopy due to lack of insurance. Stool cards have been given a number of times, but she has declined to use them.

## 2014-09-29 ENCOUNTER — Other Ambulatory Visit: Payer: Self-pay | Admitting: *Deleted

## 2014-09-29 DIAGNOSIS — F341 Dysthymic disorder: Secondary | ICD-10-CM

## 2014-10-01 MED ORDER — DULOXETINE HCL 60 MG PO CPEP: 60.0000 mg | ORAL_CAPSULE | Freq: Two times a day (BID) | ORAL | Status: DC

## 2014-11-10 ENCOUNTER — Encounter: Payer: Self-pay | Admitting: *Deleted

## 2014-11-12 ENCOUNTER — Encounter: Payer: Self-pay | Admitting: Internal Medicine

## 2014-11-19 ENCOUNTER — Encounter: Payer: Self-pay | Admitting: Internal Medicine

## 2014-12-03 ENCOUNTER — Encounter: Payer: Self-pay | Admitting: Internal Medicine

## 2014-12-03 ENCOUNTER — Ambulatory Visit (INDEPENDENT_AMBULATORY_CARE_PROVIDER_SITE_OTHER): Payer: Self-pay | Admitting: Internal Medicine

## 2014-12-03 VITALS — BP 139/72 | HR 96 | Temp 98.3°F | Wt 162.0 lb

## 2014-12-03 DIAGNOSIS — F17210 Nicotine dependence, cigarettes, uncomplicated: Secondary | ICD-10-CM

## 2014-12-03 DIAGNOSIS — F17200 Nicotine dependence, unspecified, uncomplicated: Secondary | ICD-10-CM

## 2014-12-03 DIAGNOSIS — F419 Anxiety disorder, unspecified: Secondary | ICD-10-CM

## 2014-12-03 DIAGNOSIS — Z79899 Other long term (current) drug therapy: Secondary | ICD-10-CM

## 2014-12-03 DIAGNOSIS — E781 Pure hyperglyceridemia: Secondary | ICD-10-CM

## 2014-12-03 DIAGNOSIS — I10 Essential (primary) hypertension: Secondary | ICD-10-CM

## 2014-12-03 DIAGNOSIS — Z7951 Long term (current) use of inhaled steroids: Secondary | ICD-10-CM

## 2014-12-03 DIAGNOSIS — J449 Chronic obstructive pulmonary disease, unspecified: Secondary | ICD-10-CM

## 2014-12-03 DIAGNOSIS — F329 Major depressive disorder, single episode, unspecified: Secondary | ICD-10-CM

## 2014-12-03 DIAGNOSIS — E119 Type 2 diabetes mellitus without complications: Secondary | ICD-10-CM

## 2014-12-03 DIAGNOSIS — E785 Hyperlipidemia, unspecified: Secondary | ICD-10-CM

## 2014-12-03 DIAGNOSIS — Z794 Long term (current) use of insulin: Secondary | ICD-10-CM

## 2014-12-03 DIAGNOSIS — F418 Other specified anxiety disorders: Secondary | ICD-10-CM

## 2014-12-03 DIAGNOSIS — Z0000 Encounter for general adult medical examination without abnormal findings: Secondary | ICD-10-CM

## 2014-12-03 LAB — POCT GLYCOSYLATED HEMOGLOBIN (HGB A1C): Hemoglobin A1C: 7.1

## 2014-12-03 LAB — GLUCOSE, CAPILLARY: GLUCOSE-CAPILLARY: 215 mg/dL — AB (ref 65–99)

## 2014-12-03 MED ORDER — ALBUTEROL SULFATE HFA 108 (90 BASE) MCG/ACT IN AERS: 1.0000 | INHALATION_SPRAY | RESPIRATORY_TRACT | Status: DC | PRN

## 2014-12-03 MED ORDER — ALPRAZOLAM 0.5 MG PO TABS: ORAL_TABLET | ORAL | Status: DC

## 2014-12-03 MED ORDER — FLUTICASONE-SALMETEROL 500-50 MCG/DOSE IN AEPB: 1.0000 | INHALATION_SPRAY | Freq: Two times a day (BID) | RESPIRATORY_TRACT | Status: DC

## 2014-12-03 MED ORDER — FENOFIBRATE 160 MG PO TABS: 160.0000 mg | ORAL_TABLET | Freq: Every day | ORAL | Status: DC

## 2014-12-03 MED ORDER — INSULIN GLARGINE 100 UNIT/ML ~~LOC~~ SOLN: SUBCUTANEOUS | Status: DC

## 2014-12-03 NOTE — Assessment & Plan Note (Addendum)
Lab Results  Component Value Date   HGBA1C 7.1 12/03/2014   HGBA1C 8.5 09/08/2014   HGBA1C 8.5 05/25/2014     Assessment: Diabetes control: fair control Progress toward A1C goal:  improved Comments: Pt cut back on her Lantus from 45u qam and 30u qhs to 45u qhs only. She denies any CBGs <70 and is checking her CBGs approx QID.   Plan: Medications:  continue current medications: Lantus 45u qhs and Metformin 1000mg  BID Home glucose monitoring: Frequency: 4 times a day Timing: before meals Instruction/counseling given: reminded to bring medications to each visit and discussed diet Educational resources provided: brochure Self management tools provided: home glucose logbook Other plans: Encouraged regular po intake.  F/u in 3 mo for A1c recheck

## 2014-12-03 NOTE — Assessment & Plan Note (Addendum)
Pt out of fenofibrate x3 days, which is no longer covered by the [facility] program. She stopped taking the statin due to muscle aches.  - Prescribing fenofibrate for her to get from [facility] - Good Rx coupon for approx $21 for 30 tabs --> pt states she is able to afford this monthly.  - Checking lipid panel today Addendum: 12/09/14: TG from non- fasting lipid panel are actually improved at 450 from 1419 a year ago. Will continue fenofibrate and hopefully as her diet and lifestyle changes improve, her TG will also continue to decline.

## 2014-12-03 NOTE — Assessment & Plan Note (Addendum)
Pt not able to tolerate statin due to muscle aches, even with lower dose, nor Lovaza due to nausea and stomach upset.  - Refilling fenofibrate and will continue OTC B3. - Checking lipid panel today  Addendum: 12/09/14: Even off the statin, her lipid panel is improved with total cholesterol of 237 and direct LDL 158. Vanessa [name] is not interested in restarting the statin. While she does have risk factors for MI and stroke, including DM2 and significant tobacco abuse in addition to her hyperlipidemia, with all of her social/family (father's death, husband's poor health) and financial issues, treating Vanessa [name] has been difficult. For now, will continue with the B3 and fenofibrate. The B3 should hopefully improve her HDL level as well (23). If she continues to improve her diet, I am hopeful that her cholesterol levels will further improve as well. She should have her lipid panel and direct LDL rechecked in the next 2-3 months. I have been unsuccessful getting her in for a fasting lipid panel in the past.  If her cholesterol remains elevated, please readdress restarting the statin with Vanessa [name].

## 2014-12-03 NOTE — Assessment & Plan Note (Addendum)
BP Readings from Last 3 Encounters:  12/03/14 139/72  09/08/14 140/65  05/25/14 126/64    Lab Results  Component Value Date   NA 135* 10/11/2013   K 4.4 10/11/2013   CREATININE 0.41* 10/11/2013    Assessment: Blood pressure control: controlled Progress toward BP goal:  at goal   Plan: Medications:  continue current medications: Lisinopril 5mg  daily Educational resources provided: brochure Self management tools provided: home blood pressure logbook Other plans: Checking CMP and will need to up titrate lisinopril if BP elevated at next visit. Her SBP fluctuates from 100s to 130s, so will continue 5mg  for now.

## 2014-12-03 NOTE — Progress Notes (Signed)
Patient ID: Vanessa [name], female   DOB: 1961/06/10, 54 y.o.   MRN: [id number]  Subjective:   Patient ID: Vanessa [name] female   DOB: Jun 19, 1961 54 y.o.   MRN: [id number]  HPI: Vanessa [name] is a 54 y.o. F w/ PMH Asthma, poorly controlled DM2, Hypertriglyceridemia, GERD, depression, and anxiety who presents for a routine f/u.  Pt has cut back on her insulin from 45u qam and 30u qhs to 45u qhs. She is checking her CBGs approx 4x/day and her CBGs have reportedly been in 90s with lowest at 77. She shares her meter with her husband and did not bring it in today.  She has been trying to eat better. She states that her husband recently suffered from a 2nd MI, and they have been working together to improve their health.   She states that the health Dept is no longer carrying Tricor (fenofibrate), and she has been out of the medication for 3 days.   Past Medical History  Diagnosis Date  . Asthma     No PFT performed  . Diabetes mellitus 2002  . Depression   . Panic attacks     mostly Agaraphobia  . GERD (gastroesophageal reflux disease)   . Helicobacter pylori (H. pylori) infection     s/p triple therapy  . Hyperlipidemia   . Hepatic hemangioma   . Hypertriglyceridemia    Current Outpatient Prescriptions  Medication Sig Dispense Refill  . acetaminophen (TYLENOL) 500 MG tablet Take 1,000 mg by mouth every 6 (six) hours as needed for mild pain.    Marland Kitchen albuterol (PROVENTIL HFA;VENTOLIN HFA) 108 (90 BASE) MCG/ACT inhaler Inhale 1-2 puffs into the lungs every 4 (four) hours as needed for wheezing. 9 Inhaler 4  . ALPRAZolam (XANAX) 0.5 MG tablet TAKE ONE TABLET BY MOUTH THREE TIMES DAILY AS NEEDED FOR SLEEP OR  ANXIETY 90 tablet 0  . aspirin 81 MG tablet Take 1 tablet (81 mg total) by mouth daily. 30 tablet 2  . diphenhydrAMINE (BENADRYL) 25 mg capsule Take 50 mg by mouth every 6 (six) hours as needed for allergies.    . DULoxetine (CYMBALTA) 60 MG capsule Take 1 capsule (60 mg total) by mouth 2 (two)  times daily. 180 capsule 3  . fenofibrate (TRICOR) 145 MG tablet Take 1 tablet (145 mg total) by mouth daily. 90 tablet 3  . Fluticasone-Salmeterol (ADVAIR DISKUS) 500-50 MCG/DOSE AEPB Inhale 1 puff into the lungs every 12 (twelve) hours. 2 each 6  . ibuprofen (ADVIL,MOTRIN) 200 MG tablet Take 600 mg by mouth every 6 (six) hours as needed for moderate pain.    Marland Kitchen insulin glargine (LANTUS) 100 UNIT/ML injection Inject 45 in the morning and 35 in the evening . 270 mL 3  . lisinopril (PRINIVIL,ZESTRIL) 5 MG tablet Take 1 tablet (5 mg total) by mouth daily. 30 tablet 11  . metFORMIN (GLUCOPHAGE) 1000 MG tablet Take 1 tablet (1,000 mg total) by mouth 2 (two) times daily with a meal. 180 tablet 3  . niacin (NIASPAN) 500 MG CR tablet Take 1 tablet (500 mg total) by mouth 2 (two) times daily. (Patient not taking: Reported on 09/08/2014) 180 tablet 3  . omega-3 acid ethyl esters (LOVAZA) 1 G capsule Take 2 capsules (2 g total) by mouth 2 (two) times daily. 360 capsule 3  . omeprazole (PRILOSEC) 20 MG capsule Take 1 capsule (20 mg total) by mouth daily. 30 capsule 2  . pravastatin (PRAVACHOL) 20 MG tablet Take 1 tablet (20  mg total) by mouth every evening. 30 tablet 11  . Simethicone (GAS-X EXTRA STRENGTH PO) Take 1 tablet by mouth 3 (three) times daily as needed (gas).    Marland Kitchen tiotropium (SPIRIVA HANDIHALER) 18 MCG inhalation capsule Place 1 capsule (18 mcg total) into inhaler and inhale daily. 90 capsule 11   No current facility-administered medications for this visit.   Family History  Problem Relation Age of Onset  . Colon polyps Mother     benign  . Cancer Mother     ovarian cancer  . Cancer Maternal Aunt     breast cancer, ovarian cancer  . Diabetes Maternal Uncle   . Cancer Father     Prostate Cancer    History   Social History  . Marital Status: Married    Spouse Name: N/A  . Number of Children: N/A  . Years of Education: N/A   Social History Main Topics  . Smoking status: Current Every  Day Smoker -- 1.00 packs/day for 16 years    Types: Cigarettes  . Smokeless tobacco: Never Used     Comment: trying to cut back to 1/2 ppd  . Alcohol Use: No     Comment: Seldom  . Drug Use: No  . Sexual Activity: Not Currently   Other Topics Concern  . None   Social History Narrative   Drinks at least a pot of coffee every day.    Review of Systems: Constitutional: Denies fever, chills, appetite change.  Respiratory: Denies SOB, DOE Cardiovascular: Denies chest pain or leg swelling.  Gastrointestinal: Intermittent nausea and abd pain- improved after stopping fish oil per pt Genitourinary: Denies dysuria, urgency, frequency Musculoskeletal: Denies myalgias or arthralgias.  Skin: Denies rash or wound.  Neurological: Denies dizziness, syncope, or weakness.  Psychiatric/Behavioral: Denies mood changes. +anxiety  Objective:  Physical Exam: Filed Vitals:   12/03/14 1433  BP: 139/72  Pulse: 96  Temp: 98.3 F (36.8 C)  TempSrc: Oral  Weight: 162 lb (73.483 kg)  SpO2: 91%   Constitutional: Vital signs reviewed.  Patient is a well-developed and well-nourished female in no acute distress and cooperative with exam. Alert and oriented x3.  Head: Normocephalic and atraumatic Mouth: MMM Eyes: PERRL, EOMI. No scleral icterus.  Neck: Trachea midline, normal ROM Cardiovascular: RRR, no MRG Pulmonary/Chest: Normal respiratory effort, CTAB, no wheezes, rales, or rhonchi Abdominal: Soft. Non-tender, non-distended, bowel sounds are normal Musculoskeletal: No joint deformities, erythema, or stiffness, ROM full. Neurological: A&O x3, Strength is normal and symmetric bilaterally, cranial nerve II-XII are grossly intact, no focal deficits.  Skin: Warm, dry and intact.   Psychiatric: Normal mood and affect, seems to be doing well today, happy. Speech and behavior is normal.   Assessment & Plan:   Please refer to Problem List based Assessment and Plan

## 2014-12-03 NOTE — Progress Notes (Signed)
Case discussed with Dr. Glenn at the time of the visit.  We reviewed the resident's history and exam and pertinent patient test results.  I agree with the assessment, diagnosis, and plan of care documented in the resident's note.   

## 2014-12-03 NOTE — Assessment & Plan Note (Signed)
Pt states breathing has been doing well. She denies any COPD exacerbations. She requests refills for her Advair and albuterol, which are due. - Advair and Albuterol refilled and [facility]ed to [facility] with [facility].

## 2014-12-03 NOTE — Assessment & Plan Note (Signed)
  Assessment: Progress toward smoking cessation:  smoking less (1ppd) Barriers to progress toward smoking cessation:  withdrawal symptoms, lack of motivation to quit, stress Comments: Pt states that she has cut way down with her smoking.   Plan: Instruction/counseling given:  I counseled patient on the dangers of tobacco use, advised patient to stop smoking, and reviewed strategies to maximize success. Educational resources provided:  [employer] account manager) brochure Self management tools provided:  smoking cessation plan (STAR Quit Plan)  Patient agreed to the following self-care plans for smoking cessation: go to the [employer] (https://scott-booker.info/), cut down the number of cigarettes smoked (pt not ready to quit at this time)  Other plans: Pt not ready to quit at this time.

## 2014-12-03 NOTE — Assessment & Plan Note (Signed)
Pt w/o insurance and cannot afford to pay for a colonoscopy out of pocket. - Ordering stool cards, which will need to mail.

## 2014-12-03 NOTE — Patient Instructions (Signed)
Great job with your A1c and blood sugars! Keep up the good work.   Continue to work on cutting back your smoking!  We will check your lipid panel, kidney and liver function and electrolytes today.    General Instructions:   Please bring your medicines with you each time you come to clinic.  Medicines may include prescription medications, over-the-counter medications, herbal remedies, eye drops, vitamins, or other pills.   Progress Toward Treatment Goals:  Treatment Goal 12/03/2014  Hemoglobin A1C improved  Blood pressure at goal  Stop smoking smoking the same amount    Self Care Goals & Plans:  Self Care Goal 12/03/2014  Manage my medications take my medicines as prescribed; bring my medications to every visit; refill my medications on time  Monitor my health keep track of my blood glucose; bring my glucose meter and log to each visit; check my feet daily  Eat healthy foods eat baked foods instead of fried foods; eat foods that are low in salt  Be physically active -  Stop smoking go to the [employer] website (https://scott-booker.info/); cut down the number of cigarettes smoked    Home Blood Glucose Monitoring 09/08/2014  Check my blood sugar 2 times a day  When to check my blood sugar before meals     Care Management & Community Referrals:  No flowsheet data found.

## 2014-12-04 LAB — LIPID PANEL
CHOLESTEROL: 237 mg/dL — AB (ref 0–200)
HDL: 23 mg/dL — AB (ref >=46)
Total CHOL/HDL Ratio: 10.3 Ratio
Triglycerides: 450 mg/dL — ABNORMAL HIGH (ref <150)

## 2014-12-04 LAB — COMPLETE METABOLIC PANEL WITH GFR
ALT: 19 U/L (ref 0–35)
AST: 18 U/L (ref 0–37)
Albumin: 4.5 g/dL (ref 3.5–5.2)
Alkaline Phosphatase: 74 U/L (ref 39–117)
BILIRUBIN TOTAL: 0.3 mg/dL (ref 0.2–1.2)
BUN: 7 mg/dL (ref 6–23)
CO2: 19 meq/L (ref 19–32)
CREATININE: 0.45 mg/dL — AB (ref 0.50–1.10)
Calcium: 9.6 mg/dL (ref 8.4–10.5)
Chloride: 104 mEq/L (ref 96–112)
GFR, Est African American: >89 mL/min
GLUCOSE: 191 mg/dL — AB (ref 70–99)
POTASSIUM: 4.1 meq/L (ref 3.5–5.3)
SODIUM: 138 meq/L (ref 135–145)
Total Protein: 6.9 g/dL (ref 6.0–8.3)

## 2014-12-04 LAB — LDL CHOLESTEROL, DIRECT: Direct LDL: 158 mg/dL — ABNORMAL HIGH

## 2014-12-09 NOTE — Assessment & Plan Note (Addendum)
Anxiety and depression seem stable and overall she seems like she is doing well.  - Refilled Xanax 0.5mg  TID, disp #90, 0 refills.

## 2014-12-24 ENCOUNTER — Other Ambulatory Visit: Payer: Self-pay | Admitting: Internal Medicine

## 2014-12-29 ENCOUNTER — Telehealth: Payer: Self-pay | Admitting: *Deleted

## 2014-12-29 NOTE — Telephone Encounter (Signed)
[facility] calls and states pt told them she will be out of town for 3 months and needs her ventolin filled for that amount of time, do you approve for them to do this?

## 2014-12-30 NOTE — Telephone Encounter (Signed)
Just seen in June. OK to fill for 3 months.

## 2014-12-30 NOTE — Telephone Encounter (Signed)
Called to [facility], spoke w/ cathy

## 2015-03-04 ENCOUNTER — Telehealth: Payer: Self-pay | Admitting: Internal Medicine

## 2015-03-04 NOTE — Telephone Encounter (Signed)
Call to patient to confirm appointment for 03/05/15 at 10:45 lmtcb

## 2015-03-05 ENCOUNTER — Ambulatory Visit: Payer: Self-pay | Admitting: Internal Medicine

## 2015-03-08 ENCOUNTER — Ambulatory Visit: Payer: Self-pay | Admitting: Internal Medicine

## 2015-03-12 ENCOUNTER — Encounter: Payer: Self-pay | Admitting: Internal Medicine

## 2015-03-12 ENCOUNTER — Ambulatory Visit: Payer: Self-pay | Admitting: Internal Medicine

## 2015-03-31 ENCOUNTER — Ambulatory Visit: Payer: Self-pay | Admitting: Internal Medicine

## 2015-04-07 ENCOUNTER — Ambulatory Visit: Payer: Self-pay | Admitting: Internal Medicine

## 2015-04-16 ENCOUNTER — Ambulatory Visit (INDEPENDENT_AMBULATORY_CARE_PROVIDER_SITE_OTHER): Payer: Self-pay | Admitting: Internal Medicine

## 2015-04-16 ENCOUNTER — Encounter: Payer: Self-pay | Admitting: Internal Medicine

## 2015-04-16 VITALS — BP 127/73 | HR 115 | Temp 98.4°F | Ht 62.5 in | Wt 159.0 lb

## 2015-04-16 DIAGNOSIS — F329 Major depressive disorder, single episode, unspecified: Secondary | ICD-10-CM

## 2015-04-16 DIAGNOSIS — I10 Essential (primary) hypertension: Secondary | ICD-10-CM

## 2015-04-16 DIAGNOSIS — E119 Type 2 diabetes mellitus without complications: Secondary | ICD-10-CM

## 2015-04-16 DIAGNOSIS — Z9114 Patient's other noncompliance with medication regimen: Secondary | ICD-10-CM

## 2015-04-16 DIAGNOSIS — J449 Chronic obstructive pulmonary disease, unspecified: Secondary | ICD-10-CM

## 2015-04-16 DIAGNOSIS — Z23 Encounter for immunization: Secondary | ICD-10-CM

## 2015-04-16 DIAGNOSIS — Z7984 Long term (current) use of oral hypoglycemic drugs: Secondary | ICD-10-CM

## 2015-04-16 DIAGNOSIS — Z7951 Long term (current) use of inhaled steroids: Secondary | ICD-10-CM

## 2015-04-16 DIAGNOSIS — F418 Other specified anxiety disorders: Secondary | ICD-10-CM

## 2015-04-16 DIAGNOSIS — F419 Anxiety disorder, unspecified: Secondary | ICD-10-CM

## 2015-04-16 DIAGNOSIS — F17210 Nicotine dependence, cigarettes, uncomplicated: Secondary | ICD-10-CM

## 2015-04-16 DIAGNOSIS — Z0000 Encounter for general adult medical examination without abnormal findings: Secondary | ICD-10-CM

## 2015-04-16 DIAGNOSIS — Z794 Long term (current) use of insulin: Secondary | ICD-10-CM

## 2015-04-16 DIAGNOSIS — F32A Depression, unspecified: Secondary | ICD-10-CM

## 2015-04-16 LAB — POCT GLYCOSYLATED HEMOGLOBIN (HGB A1C): Hemoglobin A1C: 8.2

## 2015-04-16 LAB — GLUCOSE, CAPILLARY: GLUCOSE-CAPILLARY: 298 mg/dL — AB (ref 65–99)

## 2015-04-16 MED ORDER — ALPRAZOLAM 0.5 MG PO TABS: ORAL_TABLET | ORAL | Status: DC

## 2015-04-16 MED ORDER — LISINOPRIL 5 MG PO TABS: 5.0000 mg | ORAL_TABLET | Freq: Every day | ORAL | Status: DC

## 2015-04-16 MED ORDER — METFORMIN HCL 1000 MG PO TABS: 1000.0000 mg | ORAL_TABLET | Freq: Two times a day (BID) | ORAL | Status: DC

## 2015-04-16 NOTE — Patient Instructions (Signed)
Vanessa [name] it was nice meeting you today.  -Speak to Vanessa [name] about the [place] card.  -Please bring her log and meter with you to the next visit. Return to the clinic in 1-2 weeks.

## 2015-04-18 NOTE — Assessment & Plan Note (Signed)
It was noted patient's pulse ox O2 saturation was 88% upon arrival to the clinic and went up to 92% after resting for a few minutes. She is currently using Advair, Albuterol prn, and Spiriva. Patient reports using rescue inhaler 2-3 times a day. Also reports having cough with clear sputum production on occasion. States she gets short of breath after walking 2 blocks. She is currently a smoker and when asked about smoking cessation, expressed she is not ready to quit yet. Patient's COPD and resulting chronic hypoxia is concerning because it can lead to pulmonary HTN and eventually cor pulmonale. We discussed with the patient that she would benefit from home oxygen therapy and discused the long-term health risks of not being on supplemental oxygen. Patient expressed her understanding and declined.  -Discuss need for home O2 at future visits -Discuss smoking cessation at future visits -Continue Albuterol 1-2 puffs q4 prn -Continue Advair 1 puff q12 -Continue Spiriva 18 mcg daily

## 2015-04-18 NOTE — Assessment & Plan Note (Signed)
Lab Results  Component Value Date   HGBA1C 8.2 04/16/2015   HGBA1C 7.1 12/03/2014   HGBA1C 8.5 09/08/2014     Assessment: Diabetes control:  A1c above goal (<7) Progress toward A1C goal:   deteriorated  Comments: Patient did not bring her home blood glucose monitor and log to this visit. Self reported average of 150s at home with lowest value 70 and highest value 298 (at this visit). States she checks her blood glucose 3-4 times per day (before and after meals). Denies experiencing any symptoms of hypoglycemia. Patient is supposed to be taking Metformin 1000 u BID and Lantus 45 u QHS. Reports using the Metformin correctly but states she is taking Lantus twice daily: 30 u at night and 45 u in the morning. States at present she has a lot of stressors in her life, and as such, is eating "everything."  Plan: Medications: I explained to her that she should be taking Lantus only at bedtime and not twice daily. Since patient did not bring her glucose meter, no changes in medications made at this visit. Continue Metformin 1000 u BID and Lantus 45 u QHS.  Home glucose monitoring: Frequency:  3 times a day and when experiencing hypoglycemic symptoms  Timing:  before meals (not after) Instruction/counseling given: Encouraged her to eat healthy and exercise.  Other plans: -RTC in 1-2 weeks with log and meter   Note: Patient is currently uninsured and we discussed her speaking to Ms. Bonna Gains about getting an orange card. Patient was hesitant stating she did not qualify 2 years ago because her family income was higher than the cut-off. Regardless, I informed Ms. Hill of this issue and she will speak to the patient as getting an orange card will put less financial burden on her, especially when it comes to referrals. Patient is due for her diabetic eye and foot exams.

## 2015-04-18 NOTE — Progress Notes (Signed)
Patient ID: Vanessa [name], female   DOB: Jun 08, 1961, 54 y.o.   MRN: [id number]   Subjective:   Patient ID: Vanessa [name] female   DOB: Dec 30, 1960 54 y.o.   MRN: [id number]  HPI: Vanessa [name] is a 54 y.o. F with a PMHx of asthma, DM, depression, panic attacks, GERD, HLD, hepatic hemangioma, and hypertriglyceridemia presenting to the clinic for a follow-up of her diabetes and COPD. Please see assessment and plan for the status of the patient's chronic medical conditions.     Past Medical History  Diagnosis Date  . Asthma     No PFT performed  . Diabetes mellitus 2002  . Depression   . Panic attacks     mostly Agaraphobia  . GERD (gastroesophageal reflux disease)   . Helicobacter pylori (H. pylori) infection     s/p triple therapy  . Hyperlipidemia   . Hepatic hemangioma   . Hypertriglyceridemia    Current Outpatient Prescriptions  Medication Sig Dispense Refill  . acetaminophen (TYLENOL) 500 MG tablet Take 1,000 mg by mouth every 6 (six) hours as needed for mild pain.    Marland Kitchen albuterol (PROVENTIL HFA;VENTOLIN HFA) 108 (90 BASE) MCG/ACT inhaler Inhale 1-2 puffs into the lungs every 4 (four) hours as needed for wheezing. 9 Inhaler 4  . ALPRAZolam (XANAX) 0.5 MG tablet TAKE ONE TABLET BY MOUTH THREE TIMES DAILY AS NEEDED FOR SLEEP OR  ANXIETY 60 tablet 0  . aspirin 81 MG tablet Take 1 tablet (81 mg total) by mouth daily. 30 tablet 2  . diphenhydrAMINE (BENADRYL) 25 mg capsule Take 50 mg by mouth every 6 (six) hours as needed for allergies.    . DULoxetine (CYMBALTA) 60 MG capsule Take 1 capsule (60 mg total) by mouth 2 (two) times daily. 180 capsule 3  . fenofibrate 160 MG tablet Take 1 tablet (160 mg total) by mouth daily. 30 tablet 11  . Fluticasone-Salmeterol (ADVAIR DISKUS) 500-50 MCG/DOSE AEPB Inhale 1 puff into the lungs every 12 (twelve) hours. 2 each 6  . ibuprofen (ADVIL,MOTRIN) 200 MG tablet Take 600 mg by mouth every 6 (six) hours as needed for moderate pain.    Marland Kitchen insulin glargine  (LANTUS) 100 UNIT/ML injection Inject 45 units in the evening . 270 mL 3  . lisinopril (PRINIVIL,ZESTRIL) 5 MG tablet Take 1 tablet (5 mg total) by mouth daily. 90 tablet 3  . metFORMIN (GLUCOPHAGE) 1000 MG tablet Take 1 tablet (1,000 mg total) by mouth 2 (two) times daily with a meal. 180 tablet 1  . niacin (NIASPAN) 500 MG CR tablet Take 1 tablet (500 mg total) by mouth 2 (two) times daily. 180 tablet 3  . omeprazole (PRILOSEC) 20 MG capsule Take 1 capsule (20 mg total) by mouth daily. 30 capsule 2  . pravastatin (PRAVACHOL) 20 MG tablet Take 1 tablet (20 mg total) by mouth every evening. (Patient not taking: Reported on 12/03/2014) 30 tablet 11  . Simethicone (GAS-X EXTRA STRENGTH PO) Take 1 tablet by mouth 3 (three) times daily as needed (gas).    Marland Kitchen tiotropium (SPIRIVA HANDIHALER) 18 MCG inhalation capsule Place 1 capsule (18 mcg total) into inhaler and inhale daily. 90 capsule 11   No current facility-administered medications for this visit.   Family History  Problem Relation Age of Onset  . Colon polyps Mother     benign  . Cancer Mother     ovarian cancer  . Cancer Maternal Aunt     breast cancer, ovarian cancer  .  Diabetes Maternal Uncle   . Cancer Father     Prostate Cancer    Social History   Social History  . Marital Status: Married    Spouse Name: N/A  . Number of Children: N/A  . Years of Education: N/A   Social History Main Topics  . Smoking status: Current Every Day Smoker -- 1.00 packs/day for 16 years    Types: Cigarettes  . Smokeless tobacco: Never Used     Comment: trying to cut back to 1/2 ppd  . Alcohol Use: No     Comment: Seldom  . Drug Use: No  . Sexual Activity: Not Currently   Other Topics Concern  . None   Social History Narrative   Drinks at least a pot of coffee every day.    Review of Systems: Review of Systems  Constitutional: Negative for fever and chills.  HENT: Negative for ear pain.   Eyes: Negative for blurred vision and pain.    Respiratory: Positive for cough, sputum production and shortness of breath. Negative for wheezing.   Cardiovascular: Negative for chest pain, palpitations and leg swelling.  Gastrointestinal: Negative for nausea, vomiting, abdominal pain, diarrhea and constipation.  Genitourinary: Negative for dysuria, urgency and frequency.  Musculoskeletal: Negative for myalgias.  Skin: Negative for itching and rash.  Neurological: Negative for dizziness, sensory change, focal weakness and headaches.   Objective:  Physical Exam: Filed Vitals:   04/16/15 0947  BP: 127/73  Pulse: 115  Temp: 98.4 F (36.9 C)  TempSrc: Oral  Height: 5' 2.5" (1.588 m)  Weight: 159 lb (72.122 kg)  SpO2: 92%   Physical Exam  Constitutional: She is oriented to person, place, and time. She appears well-developed and well-nourished. No distress.  HENT:  Head: Normocephalic and atraumatic.  Eyes: EOM are normal. Pupils are equal, round, and reactive to light.  Neck: Neck supple. No tracheal deviation present.  Cardiovascular: Normal rate, regular rhythm and intact distal pulses.   Pulmonary/Chest: Effort normal. No respiratory distress. She has no wheezes. She has no rales.  Abdominal: Soft. Bowel sounds are normal. She exhibits no distension. There is no tenderness.  Musculoskeletal: She exhibits no edema.  Neurological: She is alert and oriented to person, place, and time.  Skin: Skin is warm and dry.   Assessment & Plan:

## 2015-04-18 NOTE — Assessment & Plan Note (Signed)
Patient requested a refill on her Xanax, last prescribed 12/03/14 for 90 tabs. [place] per [place], the timing of this refill is appropriate. Patient reports taking 3 pills only on her "bad days" but states other wise she does not take the medication everyday. -Alprazolam 0.5 mg TID prn refilled at this visit, total 60 tabs. This supply should last her until her appointment with her PCP on 06/04/15.

## 2015-04-18 NOTE — Assessment & Plan Note (Signed)
Influenza vaccine administered at this visit. 

## 2015-04-19 NOTE — Progress Notes (Signed)
Internal Medicine Clinic Attending  I saw and evaluated the patient.  I personally confirmed the key portions of the history and exam documented by Dr. Marlowe Sax and I reviewed pertinent patient test results.  The assessment, diagnosis, and plan were formulated together and I agree with the documentation in the resident's note. [name] was reluctant to discuss need for supplemental O2 and seriousness of her medical issues.

## 2015-06-16 ENCOUNTER — Other Ambulatory Visit: Payer: Self-pay | Admitting: *Deleted

## 2015-06-16 ENCOUNTER — Other Ambulatory Visit: Payer: Self-pay | Admitting: Internal Medicine

## 2015-06-16 DIAGNOSIS — F419 Anxiety disorder, unspecified: Principal | ICD-10-CM

## 2015-06-16 DIAGNOSIS — F329 Major depressive disorder, single episode, unspecified: Secondary | ICD-10-CM

## 2015-06-16 MED ORDER — ALPRAZOLAM 0.5 MG PO TABS: ORAL_TABLET | ORAL | Status: DC

## 2015-06-16 NOTE — Telephone Encounter (Signed)
rx phoned in to [facility]- pt aware

## 2015-06-16 NOTE — Telephone Encounter (Signed)
Patient requesting xanax refill.

## 2015-06-16 NOTE — Telephone Encounter (Signed)
Request sent to dr taylor

## 2015-07-23 ENCOUNTER — Encounter: Payer: Self-pay | Admitting: Internal Medicine

## 2015-07-26 ENCOUNTER — Encounter: Payer: Self-pay | Admitting: Internal Medicine

## 2015-08-26 ENCOUNTER — Telehealth: Payer: Self-pay | Admitting: Dietician

## 2015-08-26 NOTE — Telephone Encounter (Signed)
Called patient as part of project trying to help patient's with a1C between 8-9% lower their blood sugars to target. She feels she is doing well and anticipates her A1C to be better when she comes for her visit tomorrow. She denies barriers to lowering her A1C, can get medicine without problems, is losing weight ( from stomach problems) and sugars are better. Offered her assistance as needed  and congratulated her for knowing that her A1C goal in her chart was <7%.  Would have liked to assess her diabetes distress and her opinion of the A1C goal but she was driving so we got off the phone for her to be safe. If she is interested I can do this tomorrow when she is here.

## 2015-08-27 ENCOUNTER — Encounter: Payer: Self-pay | Admitting: Internal Medicine

## 2015-08-27 ENCOUNTER — Ambulatory Visit (INDEPENDENT_AMBULATORY_CARE_PROVIDER_SITE_OTHER): Payer: Self-pay | Admitting: Internal Medicine

## 2015-08-27 VITALS — BP 137/69 | HR 113 | Temp 97.9°F | Ht 62.5 in | Wt 157.3 lb

## 2015-08-27 DIAGNOSIS — E785 Hyperlipidemia, unspecified: Secondary | ICD-10-CM

## 2015-08-27 DIAGNOSIS — F419 Anxiety disorder, unspecified: Secondary | ICD-10-CM

## 2015-08-27 DIAGNOSIS — F329 Major depressive disorder, single episode, unspecified: Secondary | ICD-10-CM

## 2015-08-27 DIAGNOSIS — E1165 Type 2 diabetes mellitus with hyperglycemia: Secondary | ICD-10-CM

## 2015-08-27 DIAGNOSIS — E119 Type 2 diabetes mellitus without complications: Secondary | ICD-10-CM

## 2015-08-27 DIAGNOSIS — K589 Irritable bowel syndrome without diarrhea: Secondary | ICD-10-CM

## 2015-08-27 DIAGNOSIS — Z7984 Long term (current) use of oral hypoglycemic drugs: Secondary | ICD-10-CM

## 2015-08-27 DIAGNOSIS — Z794 Long term (current) use of insulin: Secondary | ICD-10-CM

## 2015-08-27 LAB — POCT GLYCOSYLATED HEMOGLOBIN (HGB A1C): HEMOGLOBIN A1C: 9.5

## 2015-08-27 LAB — GLUCOSE, CAPILLARY: GLUCOSE-CAPILLARY: 255 mg/dL — AB (ref 65–99)

## 2015-08-27 MED ORDER — ATORVASTATIN CALCIUM 20 MG PO TABS: 20.0000 mg | ORAL_TABLET | Freq: Every day | ORAL | Status: DC

## 2015-08-27 MED ORDER — CANAGLIFLOZIN-METFORMIN HCL 50-500 MG PO TABS: 1.0000 | ORAL_TABLET | Freq: Two times a day (BID) | ORAL | Status: DC

## 2015-08-27 MED ORDER — METFORMIN HCL 1000 MG PO TABS: 1000.0000 mg | ORAL_TABLET | Freq: Two times a day (BID) | ORAL | Status: DC

## 2015-08-27 NOTE — Patient Instructions (Signed)
1. Continue taking Metformin twice daily until you receive your new medication.  When you receive the new medication, stop taking the Metformin and take the new pill twice daily, just like you were taking the Metformin. 2. Continue working on your Medicaid.  Upper Gastrointestinal Series An upper gastrointestinal (GI) series is a medical test with X-rays that helps diagnose problems of the upper GI tract. The upper GI tract includes the esophagus, stomach, and duodenum. The duodenum is the first part of the small intestine. This exam is used to look for heartburn, reflux, sores (ulcers), abnormal growths, and other problems. LET YOUR CAREGIVER KNOW ABOUT:  Allergies to food or medicine.  Medicines taken, including vitamins, herbs, eyedrops, over-the-counter medicines, and creams.  Previous problems with barium.  Any constipation problems.  Breastfeeding, if this applies.  Possibility of pregnancy, if this applies. RISKS AND COMPLICATIONS Problems do not occur often with this procedure, but they are possible. Possible problems include:  An allergic reaction to barium (rare).  Nausea after drinking the barium.  Problems from the very small amount of radiation exposure.  Cramps or constipation. BEFORE THE PROCEDURE  Several days before the exam, you may need to change what you eat. Follow your caregiver's directions.  The night before the exam, do not eat or drink anything after midnight.  Ask your caregiver if it is okay to take any needed medicines with a sip of water.  If you have diabetes and need to take insulin, ask for instructions on how to do this before the exam. PROCEDURE An upper GI series is done while you are awake. You will not need pain medicine. The exam usually takes about 1 hour. The length of the exam depends on how long it takes for the barium to move through your system. It also depends on what is found during the exam.   You will drink barium. This is like  a milkshake that tastes like chalk. It might make you feel bloated. Barium helps the inside of the upper GI tract to show up clearly on the screen.  You may also swallow "fizzies." This is a substance that causes air to build up in your stomach.  A type of X-ray called fluoroscopy will be used. You may need to stand up or lie on a table. The table may move or tilt. You may need to turn from side to side. This is done to get pictures from different angles.  The exam is done when all the needed pictures have been taken. AFTER THE PROCEDURE  You may go back to your normal diet and activities right away.  Ask when your test results will be ready. Follow up with your caregiver to discuss your test results.   This information is not intended to replace advice given to you by your health care provider. Make sure you discuss any questions you have with your health care provider.   Document Released: 06/09/2000 Document Revised: 09/04/2011 Document Reviewed: 12/25/2014 Elsevier Interactive Patient Education [employer].

## 2015-08-27 NOTE — Progress Notes (Signed)
Medicine attending: Medical history, presenting problems, physical findings, and medications, reviewed with resident physician Dr Nicholas Taylor on the day of the patient visit and I concur with his evaluation and management plan. 

## 2015-08-27 NOTE — Progress Notes (Signed)
Patient ID: Vanessa [name], female   DOB: 11-03-1960, 55 y.o.   MRN: [id number]    Subjective:   Patient ID: Vanessa [name] female   DOB: 1960-08-29 55 y.o.   MRN: [id number]  HPI: [name] is a 55 y.o. female with PMH as below, here for eval abdominal pain and f/u diabetes.  Please see Problem-Based charting for the status of the patient's chronic medical issues.  Patient reports 2-3 years of nausea, vague, dull, diffuse abdominal pain worse in epigastric region, periumbilical pain after eating, diarrhea, and weight loss (10-15 lbs over last year).  She denies fever, chills, or night sweats.  This has been a chronic problem, which she has discussed with her previous PCP.  Patient has never had a colonoscopy and has been counseled on the need for EGD and colonoscopy in order to evaluate the cause of her symptoms. However, she has no insurance.  She was previously working to get Medicaid, but she stopped trying when she received the Medication Assistance Program through the health department.    Past Medical History  Diagnosis Date  . Asthma     No PFT performed  . Diabetes mellitus 2002  . Depression   . Panic attacks     mostly Agaraphobia  . GERD (gastroesophageal reflux disease)   . Helicobacter pylori (H. pylori) infection     s/p triple therapy  . Hyperlipidemia   . Hepatic hemangioma   . Hypertriglyceridemia    Current Outpatient Prescriptions  Medication Sig Dispense Refill  . acetaminophen (TYLENOL) 500 MG tablet Take 1,000 mg by mouth every 6 (six) hours as needed for mild pain.    Marland Kitchen albuterol (PROVENTIL HFA;VENTOLIN HFA) 108 (90 BASE) MCG/ACT inhaler Inhale 1-2 puffs into the lungs every 4 (four) hours as needed for wheezing. 9 Inhaler 4  . ALPRAZolam (XANAX) 0.5 MG tablet TAKE ONE TABLET BY MOUTH THREE TIMES DAILY AS NEEDED FOR SLEEP OR  ANXIETY 60 tablet 2  . aspirin 81 MG tablet Take 1 tablet (81 mg total) by mouth daily. 30 tablet 2  . atorvastatin (LIPITOR) 20 MG tablet  Take 1 tablet (20 mg total) by mouth daily. 30 tablet 3  . Canagliflozin-Metformin HCl 50-500 MG TABS Take 1 tablet by mouth 2 (two) times daily. 60 tablet 3  . diphenhydrAMINE (BENADRYL) 25 mg capsule Take 50 mg by mouth every 6 (six) hours as needed for allergies.    . DULoxetine (CYMBALTA) 60 MG capsule Take 1 capsule (60 mg total) by mouth 2 (two) times daily. 180 capsule 3  . Fluticasone-Salmeterol (ADVAIR DISKUS) 500-50 MCG/DOSE AEPB Inhale 1 puff into the lungs every 12 (twelve) hours. 2 each 6  . ibuprofen (ADVIL,MOTRIN) 200 MG tablet Take 600 mg by mouth every 6 (six) hours as needed for moderate pain.    Marland Kitchen insulin glargine (LANTUS) 100 UNIT/ML injection Inject 45 units in the evening . 270 mL 3  . lisinopril (PRINIVIL,ZESTRIL) 5 MG tablet Take 1 tablet (5 mg total) by mouth daily. 90 tablet 3  . metFORMIN (GLUCOPHAGE) 1000 MG tablet Take 1 tablet (1,000 mg total) by mouth 2 (two) times daily with a meal. 180 tablet 1  . niacin (NIASPAN) 500 MG CR tablet Take 1 tablet (500 mg total) by mouth 2 (two) times daily. 180 tablet 3  . omeprazole (PRILOSEC) 20 MG capsule Take 1 capsule (20 mg total) by mouth daily. 30 capsule 2  . pravastatin (PRAVACHOL) 20 MG tablet Take 1 tablet (20 mg  total) by mouth every evening. (Patient not taking: Reported on 12/03/2014) 30 tablet 11  . Simethicone (GAS-X EXTRA STRENGTH PO) Take 1 tablet by mouth 3 (three) times daily as needed (gas).    Marland Kitchen tiotropium (SPIRIVA HANDIHALER) 18 MCG inhalation capsule Place 1 capsule (18 mcg total) into inhaler and inhale daily. 90 capsule 11   No current facility-administered medications for this visit.   Family History  Problem Relation Age of Onset  . Colon polyps Mother     benign  . Cancer Mother     ovarian cancer  . Cancer Maternal Aunt     breast cancer, ovarian cancer  . Diabetes Maternal Uncle   . Cancer Father     Prostate Cancer    Social History   Social History  . Marital Status: Married    Spouse  Name: N/A  . Number of Children: N/A  . Years of Education: N/A   Social History Main Topics  . Smoking status: Current Every Day Smoker -- 1.00 packs/day for 16 years    Types: Cigarettes  . Smokeless tobacco: Never Used     Comment: trying to cut back to 1/2 ppd  . Alcohol Use: No     Comment: Seldom  . Drug Use: No  . Sexual Activity: Not Currently   Other Topics Concern  . None   Social History Narrative   Drinks at least a pot of coffee every day.    Review of Systems: Pertinent items are noted in HPI. Objective:  Physical Exam: Filed Vitals:   08/27/15 1420  BP: 137/69  Pulse: 113  Temp: 97.9 F (36.6 C)  TempSrc: Oral  Height: 5' 2.5" (1.588 m)  Weight: 157 lb 4.8 oz (71.351 kg)  SpO2: 92%   Physical Exam  Constitutional: She is oriented to person, place, and time and well-developed, well-nourished, and in no distress. No distress.  HENT:  Head: Normocephalic and atraumatic.  Eyes: EOM are normal. No scleral icterus.  Neck: No JVD present. No tracheal deviation present.  Cardiovascular: Normal rate, regular rhythm, normal heart sounds and intact distal pulses.   Pulmonary/Chest: Effort normal and breath sounds normal. No stridor. No respiratory distress. She has no wheezes.  Abdominal: Soft.  Protuberant.  Minimally TTP diffusely. Ventral hernia apparent when trying to sit up without signs of strangulation or incarceration.  No rebound or guarding.  Musculoskeletal: She exhibits no edema.  Neurological: She is alert and oriented to person, place, and time.  Skin: Skin is warm and dry. She is not diaphoretic.  Yellow subcutaneous plaques on bilateral eyelids.     Assessment & Plan:   Patient and case were discussed with Dr. Beryle Beams.  Please refer to Problem Based charting for further documentation.

## 2015-08-28 NOTE — Assessment & Plan Note (Signed)
A/P: HLD. Patient previously did not tolerate Pravastatin, so she was taking Fenofibrate and Niacin.  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  Will try patient on low dose Atorvastatin and titrate up slowly. - Atorvastatin 20 mg - Recheck lipid panel in 6 weeks.

## 2015-08-28 NOTE — Assessment & Plan Note (Signed)
Lab Results  Component Value Date   HGBA1C 9.5 08/27/2015   HGBA1C 8.2 04/16/2015   HGBA1C 7.1 12/03/2014     Assessment: Diabetes control:  poor Progress toward A1C goal:   deteriorating Comments: patient reports discordant readings between her home meter which she shares with her husband and the clinic meter.  Patient did not bring in meter or logbook of glucose readings.  Plan: Medications:  Switch Metformin 1000 BID to Invokamet 50-1000 mg BID Home glucose monitoring: Frequency:   Timing:   Instruction/counseling given: reminded to bring blood glucose meter & log to each visit and reminded to bring medications to each visit Educational resources provided: referral to diabetes education class Self management tools provided: home glucose logbook Other plans: RTC 3 months to recheck A1c.  Diet counseling with Butch Penny.

## 2015-08-28 NOTE — Assessment & Plan Note (Signed)
A/P: Etiology of patient's pain/weight loss is unclear, though they include GERD, PUD, diabetic gastropathy, or GI cancer.  Patient was again counseled on the importance of getting Medicaid insurance in order to undergo EGD and colonoscopy for work up.  Until that time, will continue diabetes control and proceed with UGI series in order to evaluate for gross lower esophageal or gastric lesions.  While EGD would be far more sensitive for workup, the patient is unable to afford.  If she is admitted to the hospital for another reason, inpatient EGD is strongly recommended. - UGI series - Omeprazole

## 2015-09-10 ENCOUNTER — Other Ambulatory Visit: Payer: Self-pay | Admitting: *Deleted

## 2015-09-10 ENCOUNTER — Telehealth: Payer: Self-pay | Admitting: Internal Medicine

## 2015-09-10 DIAGNOSIS — E119 Type 2 diabetes mellitus without complications: Secondary | ICD-10-CM

## 2015-09-10 DIAGNOSIS — Z794 Long term (current) use of insulin: Principal | ICD-10-CM

## 2015-09-10 NOTE — Telephone Encounter (Signed)
Called pt back, sent dr Lovena Le a note

## 2015-09-10 NOTE — Telephone Encounter (Signed)
Has a problem with new med she was given for blood sugar

## 2015-09-10 NOTE — Telephone Encounter (Signed)
Pt calls and states she will not take invokana, she states she has read all the information about it and does not want to take anything but metformin, she states she is sorry but she is too afraid to take any form of this medicine

## 2015-09-13 MED ORDER — METFORMIN HCL 1000 MG PO TABS: 1000.0000 mg | ORAL_TABLET | Freq: Two times a day (BID) | ORAL | Status: DC

## 2015-09-13 NOTE — Telephone Encounter (Signed)
complete

## 2015-11-01 ENCOUNTER — Other Ambulatory Visit: Payer: Self-pay | Admitting: Internal Medicine

## 2015-11-02 ENCOUNTER — Other Ambulatory Visit: Payer: Self-pay | Admitting: Internal Medicine

## 2015-11-02 NOTE — Telephone Encounter (Signed)
Called to pharm 

## 2015-11-19 ENCOUNTER — Encounter: Payer: Self-pay | Admitting: Internal Medicine

## 2015-12-09 ENCOUNTER — Ambulatory Visit (INDEPENDENT_AMBULATORY_CARE_PROVIDER_SITE_OTHER): Payer: 59 | Admitting: Gastroenterology

## 2015-12-09 ENCOUNTER — Other Ambulatory Visit (INDEPENDENT_AMBULATORY_CARE_PROVIDER_SITE_OTHER): Payer: 59

## 2015-12-09 ENCOUNTER — Encounter: Payer: Self-pay | Admitting: Gastroenterology

## 2015-12-09 VITALS — BP 84/50 | HR 96 | Ht 62.0 in | Wt 165.0 lb

## 2015-12-09 DIAGNOSIS — R1084 Generalized abdominal pain: Secondary | ICD-10-CM | POA: Diagnosis not present

## 2015-12-09 DIAGNOSIS — IMO0001 Reserved for inherently not codable concepts without codable children: Secondary | ICD-10-CM

## 2015-12-09 DIAGNOSIS — E119 Type 2 diabetes mellitus without complications: Secondary | ICD-10-CM

## 2015-12-09 DIAGNOSIS — R140 Abdominal distension (gaseous): Secondary | ICD-10-CM

## 2015-12-09 DIAGNOSIS — R110 Nausea: Secondary | ICD-10-CM | POA: Diagnosis not present

## 2015-12-09 DIAGNOSIS — R6881 Early satiety: Secondary | ICD-10-CM

## 2015-12-09 DIAGNOSIS — R197 Diarrhea, unspecified: Secondary | ICD-10-CM

## 2015-12-09 DIAGNOSIS — Z794 Long term (current) use of insulin: Secondary | ICD-10-CM

## 2015-12-09 DIAGNOSIS — R634 Abnormal weight loss: Secondary | ICD-10-CM

## 2015-12-09 LAB — COMPREHENSIVE METABOLIC PANEL
ALT: 16 U/L (ref 0–35)
AST: 13 U/L (ref 0–37)
Albumin: 4.3 g/dL (ref 3.5–5.2)
Alkaline Phosphatase: 110 U/L (ref 39–117)
BUN: 6 mg/dL (ref 6–23)
CHLORIDE: 100 meq/L (ref 96–112)
CO2: 27 meq/L (ref 19–32)
Calcium: 9.5 mg/dL (ref 8.4–10.5)
Creatinine, Ser: 0.58 mg/dL (ref 0.40–1.20)
GFR: 114.73 mL/min (ref >60.00)
GLUCOSE: 211 mg/dL — AB (ref 70–99)
POTASSIUM: 4.6 meq/L (ref 3.5–5.1)
SODIUM: 135 meq/L (ref 135–145)
Total Bilirubin: 0.3 mg/dL (ref 0.2–1.2)
Total Protein: 7.2 g/dL (ref 6.0–8.3)

## 2015-12-09 LAB — IGA: IgA: 168 mg/dL (ref 68–378)

## 2015-12-09 LAB — CBC WITH DIFFERENTIAL/PLATELET
BASOS PCT: 1.5 % (ref 0.0–3.0)
Basophils Absolute: 0.2 10*3/uL — ABNORMAL HIGH (ref 0.0–0.1)
EOS ABS: 0.5 10*3/uL (ref 0.0–0.7)
EOS PCT: 3.8 % (ref 0.0–5.0)
HCT: 45.6 % (ref 36.0–46.0)
Hemoglobin: 15.5 g/dL — ABNORMAL HIGH (ref 12.0–15.0)
LYMPHS PCT: 31.2 % (ref 12.0–46.0)
Lymphs Abs: 4.2 10*3/uL — ABNORMAL HIGH (ref 0.7–4.0)
MCHC: 34 g/dL (ref 30.0–36.0)
MCV: 89.6 fl (ref 78.0–100.0)
MONOS PCT: 6.7 % (ref 3.0–12.0)
Monocytes Absolute: 0.9 10*3/uL (ref 0.1–1.0)
NEUTROS ABS: 7.7 10*3/uL (ref 1.4–7.7)
NEUTROS PCT: 56.8 % (ref 43.0–77.0)
Platelets: 503 10*3/uL — ABNORMAL HIGH (ref 150.0–400.0)
RBC: 5.09 Mil/uL (ref 3.87–5.11)
RDW: 13.7 % (ref 11.5–15.5)
WBC: 13.5 10*3/uL — AB (ref 4.0–10.5)

## 2015-12-09 LAB — TSH: TSH: 3.75 u[IU]/mL (ref 0.35–4.50)

## 2015-12-09 MED ORDER — NA SULFATE-K SULFATE-MG SULF 17.5-3.13-1.6 GM/177ML PO SOLN: 1.0000 | Freq: Once | ORAL | Status: AC

## 2015-12-09 NOTE — Patient Instructions (Addendum)
You have been scheduled for a colonoscopy/EGD. Please follow written instructions given to you at your visit today.  Please pick up your prep supplies at the pharmacy within the next 1-3 days. If you use inhalers (even only as needed), please bring them with you on the day of your procedure. Your physician has requested that you go to www.startemmi.com and enter the access code given to you at your visit today. This web site gives a general overview about your procedure. However, you should still follow specific instructions given to you by our office regarding your preparation for the procedure.  Your provider has requested that you go to the basement for lab work before leaving today.  We have sent the following medications to your pharmacy for you to pick up at your convenience: Suprep  Please start a daily fiber supplement. ( Metamucil, Benefiber or Citrucel)     If you are age 55 or younger, your body mass index should be between 19-25. Your Body mass index is 30.17 kg/(m^2). If this is out of the aformentioned range listed, please consider follow up with your Primary Care Provider.   You have been scheduled for a CT scan of the abdomen and pelvis at [facility] ([address]---this is in the same building as Press photographer).   You are scheduled on Tuesday 6-20 at 2:00 PM. You should arrive at 1:45 PM   to your appointment time for registration. Please follow the written instructions below on the day of your exam:  WARNING: IF YOU ARE ALLERGIC TO IODINE/X-RAY DYE, PLEASE NOTIFY RADIOLOGY IMMEDIATELY AT [phone]! YOU WILL BE GIVEN A 55 HOUR PREMEDICATION PREP.  1) Do not eat or drink anything after 10:00 am (4 hours prior to your test) 2) You have been given 2 bottles of oral contrast to drink. The solution may taste better if refrigerated, but do NOT add ice or any other liquid to this solution. Shake  well before drinking.    Drink 1 bottle of contrast @ 12:00  Noon (2 hours prior to your exam)  Drink 1 bottle of contrast @ 1:00 PM (1 hour prior to your exam)  You may take any medications as prescribed with a small amount of water except for the following: Metformin, Glucophage, Glucovance, Avandamet, Riomet, Fortamet, Actoplus Met, Janumet, Glumetza or Metaglip. The above medications must be held the day of the exam AND 48 hours after the exam.  The purpose of you drinking the oral contrast is to aid in the visualization of your intestinal tract. The contrast solution may cause some diarrhea. Before your exam is started, you will be given a small amount of fluid to drink. Depending on your individual set of symptoms, you may also receive an intravenous injection of x-ray contrast/dye. Plan on being at [facility] for 30 minutes or long, depending on the type of exam you are having performed.  If you have any questions regarding your exam or if you need to reschedule, you may call the CT department at [phone] between the hours of 8:00 am and 5:00 pm, Monday-Friday.  ________________________________________________________________________

## 2015-12-10 DIAGNOSIS — Z794 Long term (current) use of insulin: Secondary | ICD-10-CM

## 2015-12-10 DIAGNOSIS — R140 Abdominal distension (gaseous): Secondary | ICD-10-CM | POA: Insufficient documentation

## 2015-12-10 DIAGNOSIS — E119 Type 2 diabetes mellitus without complications: Secondary | ICD-10-CM

## 2015-12-10 DIAGNOSIS — IMO0001 Reserved for inherently not codable concepts without codable children: Secondary | ICD-10-CM | POA: Insufficient documentation

## 2015-12-10 DIAGNOSIS — R6881 Early satiety: Secondary | ICD-10-CM | POA: Insufficient documentation

## 2015-12-10 DIAGNOSIS — R112 Nausea with vomiting, unspecified: Secondary | ICD-10-CM | POA: Insufficient documentation

## 2015-12-10 DIAGNOSIS — R634 Abnormal weight loss: Secondary | ICD-10-CM | POA: Insufficient documentation

## 2015-12-10 DIAGNOSIS — R197 Diarrhea, unspecified: Secondary | ICD-10-CM | POA: Insufficient documentation

## 2015-12-10 LAB — TISSUE TRANSGLUTAMINASE, IGG: TISSUE TRANSGLUT AB: 1 U/mL (ref <6)

## 2015-12-10 NOTE — Progress Notes (Signed)
12/10/2015 Vanessa [name] [id number] 03/31/1961   HISTORY OF PRESENT ILLNESS:  This is a 55 year old female who is known to Dr. Henrene Pastor for one office visit back in March 2010 at which time she was seen for complaints of epigastric abdominal pain and constipation. She was treated symptomatically with PPI and MiraLAX but she was to return if symptoms were recurrent or failed to improve and would consider ultrasound and endoscopic evaluation. She never presented back to our office until now.  She has past medical history of insulin-dependent diabetes mellitus for 15 years, hyperlipidemia, asthma, anxiety/depression, ongoing tobacco abuse.  She presents to our office today with multiple GI complaints. She says that most of the symptoms have been present for the past 4 or 5 years but significantly worsening recently. She complains of diffuse abdominal pain but says that it is most tender around her periumbilical region.  Says that she has a large bulge in that areas as well. She has a lot of nausea but denies vomiting. She says that she is very hungry but then with just 2 bites of food she starts to feel sick. She complains of explosive diarrhea with urgency at times and states that she's had 2 episodes of incontinence with this. This alternates with constipation, sometimes 3-4 days with no bowel movement. She denies seeing blood in her stool. She does admit to a 30 weight loss, which she says is then fairly recent. Complains of constant bloating sensation in her abdomen. She's never had colonoscopy or endoscopy in the past. CT scan of the abdomen and pelvis without contrast in 2014 indicated hepatomegaly and hepatic steatosis. She does not have any recent labs.   Past Medical History  Diagnosis Date  . Asthma     No PFT performed  . Diabetes mellitus 2002  . Depression   . Panic attacks     mostly Agaraphobia  . GERD (gastroesophageal reflux disease)   . Helicobacter pylori (H. pylori) infection    s/p triple therapy  . Hyperlipidemia   . Hepatic hemangioma   . Hypertriglyceridemia    Past Surgical History  Procedure Laterality Date  . Cesarean section      with 3 children  . Tubal ligation    . Ovarian cyst removal      reports that she has been smoking Cigarettes.  She has a 16 pack-year smoking history. She has never used smokeless tobacco. She reports that she does not drink alcohol or use illicit drugs. family history includes Breast cancer in her maternal aunt; Colon polyps in her mother; Diabetes in her maternal uncle; Ovarian cancer in her maternal aunt and mother. Allergies  Allergen Reactions  . Rosiglitazone Maleate     (Avandia) REACTION: Headaches      Outpatient Encounter Prescriptions as of 12/09/2015  Medication Sig  . acetaminophen (TYLENOL) 500 MG tablet Take 1,000 mg by mouth every 6 (six) hours as needed for mild pain.  Marland Kitchen ALPRAZolam (XANAX) 0.5 MG tablet TAKE ONE TABLET BY MOUTH THREE TIMES DAILY FOR SLEEP/ANXIETY  . aspirin 81 MG tablet Take 1 tablet (81 mg total) by mouth daily.  Marland Kitchen atorvastatin (LIPITOR) 20 MG tablet Take 1 tablet (20 mg total) by mouth daily.  . Canagliflozin-Metformin HCl 50-500 MG TABS Take 1 tablet by mouth 2 (two) times daily.  . diphenhydrAMINE (BENADRYL) 25 mg capsule Take 50 mg by mouth every 6 (six) hours as needed for allergies.  . DULoxetine (CYMBALTA) 60 MG capsule Take 1 capsule (  60 mg total) by mouth 2 (two) times daily.  . Fluticasone-Salmeterol (ADVAIR DISKUS) 500-50 MCG/DOSE AEPB Inhale 1 puff into the lungs every 12 (twelve) hours.  Marland Kitchen ibuprofen (ADVIL,MOTRIN) 200 MG tablet Take 600 mg by mouth every 6 (six) hours as needed for moderate pain.  Marland Kitchen insulin glargine (LANTUS) 100 UNIT/ML injection Inject 45 units in the evening .  . lisinopril (PRINIVIL,ZESTRIL) 5 MG tablet Take 1 tablet (5 mg total) by mouth daily.  . metFORMIN (GLUCOPHAGE) 1000 MG tablet Take 1 tablet (1,000 mg total) by mouth 2 (two) times daily with a meal.   . niacin (NIASPAN) 500 MG CR tablet Take 1 tablet (500 mg total) by mouth 2 (two) times daily.  Marland Kitchen omeprazole (PRILOSEC) 20 MG capsule Take 1 capsule (20 mg total) by mouth daily.  . Simethicone (GAS-X EXTRA STRENGTH PO) Take 1 tablet by mouth 3 (three) times daily as needed (gas).  . Na Sulfate-K Sulfate-Mg Sulf 17.5-3.13-1.6 GM/180ML SOLN Take 1 kit by mouth once.  . tiotropium (SPIRIVA HANDIHALER) 18 MCG inhalation capsule Place 1 capsule (18 mcg total) into inhaler and inhale daily.   No facility-administered encounter medications on file as of 12/09/2015.     REVIEW OF SYSTEMS  : All other systems reviewed and negative except where noted in the History of Present Illness.   PHYSICAL EXAM: BP 84/50 mmHg  Pulse 96  Ht _0  (1.575 m)  Wt 165 lb (74.844 kg)  BMI 30.17 kg/m2 General: Well developed white female in no acute distress Head: Normocephalic and atraumatic Eyes:  Sclerae anicteric, conjunctiva pink. Ears: Normal auditory acuity Lungs: Clear throughout to auscultation Heart: Regular rate and rhythm Abdomen: Soft, mildly distended.  ? Ascites present.  Normal bowel sounds.  Diffuse TTP.  Large ventral hernia vs diastasis recti noted. Rectal:  Will be done at the time of colonoscopy. Musculoskeletal: Symmetrical with no gross deformities  Skin: No lesions on visible extremities Extremities: No edema  Neurological: Alert oriented x 4, grossly non-focal Psychological:  Alert and cooperative. Normal mood and affect  ASSESSMENT AND PLAN: -55 year old female with multiple GI complaints including diffuse abdominal pain, bloating, alternating bowel habits between constipation and diarrhea, nausea, early satiety, and weight loss:  She most certainly may have diabetic gastroparesis from her long-standing less than adequately controlled diabetes which may be causing her nausea and early satiety. Alternating bowel habits may certainly just represent irritable bowel syndrome. The amount  of weight loss that she indicates is concerning, however. Also, by exam today I am concerned that she may potentially have some ascites and CT scan to have years ago showed hepatic steatosis. I am going to schedule her for both an endoscopy and colonoscopy with Dr. Henrene Pastor. The risks, benefits, and alternatives to EGD and colonoscopy were discussed with the patient and she consents to proceed.  I am also going to schedule her for a CT scan of the abdomen and pelvis with contrast to rule out malignancy, evaluate for ascites, and look for other causes of her abdominal pain.  We will get some labs including a CBC, CMP, TSH, and celiac studies. In the interim I have asked her to begin taking a daily fiber supplement to see if this will help regulate her bowel movements to some degree. -IDDM:  Insulin will be adjusted prior to endoscopic procedure per protocol. Will resume normal dosing after procedure.    CC:  Iline Oven, MD

## 2015-12-13 NOTE — Progress Notes (Signed)
Agree with initial assessment and plans 

## 2015-12-14 ENCOUNTER — Ambulatory Visit (INDEPENDENT_AMBULATORY_CARE_PROVIDER_SITE_OTHER)
Admission: RE | Admit: 2015-12-14 | Discharge: 2015-12-14 | Disposition: A | Discharge status: Home / Self Care | Payer: 59 | Source: Ambulatory Visit | Attending: Gastroenterology | Admitting: Gastroenterology

## 2015-12-14 DIAGNOSIS — R6881 Early satiety: Secondary | ICD-10-CM | POA: Diagnosis not present

## 2015-12-14 DIAGNOSIS — R110 Nausea: Secondary | ICD-10-CM | POA: Diagnosis not present

## 2015-12-14 DIAGNOSIS — R197 Diarrhea, unspecified: Secondary | ICD-10-CM | POA: Diagnosis not present

## 2015-12-14 DIAGNOSIS — R1084 Generalized abdominal pain: Secondary | ICD-10-CM

## 2015-12-14 MED ORDER — IOPAMIDOL (ISOVUE-300) INJECTION 61%
100.0000 mL | Freq: Once | INTRAVENOUS | Status: AC | PRN
  Administered 2015-12-14: 100 mL via INTRAVENOUS

## 2015-12-15 ENCOUNTER — Encounter: Payer: Self-pay | Admitting: *Deleted

## 2015-12-16 ENCOUNTER — Encounter: Payer: Self-pay | Admitting: Internal Medicine

## 2015-12-17 ENCOUNTER — Telehealth: Payer: Self-pay | Admitting: Internal Medicine

## 2015-12-17 NOTE — Telephone Encounter (Signed)
No

## 2015-12-21 ENCOUNTER — Encounter: Payer: Self-pay | Admitting: Internal Medicine

## 2015-12-21 ENCOUNTER — Other Ambulatory Visit: Payer: Self-pay | Admitting: *Deleted

## 2015-12-21 DIAGNOSIS — J410 Simple chronic bronchitis: Secondary | ICD-10-CM

## 2015-12-21 MED ORDER — TIOTROPIUM BROMIDE MONOHYDRATE 18 MCG IN CAPS: 18.0000 ug | ORAL_CAPSULE | Freq: Every day | RESPIRATORY_TRACT | Status: DC

## 2015-12-21 NOTE — Telephone Encounter (Signed)
Cancelled last 2 appts. Overdue for appt - DM poorly controlled. Needs seen (PCP or [facility]) next 30 days

## 2015-12-21 NOTE — Telephone Encounter (Signed)
Message sent to front office to schedule pt an appt within next 30 days.

## 2015-12-27 ENCOUNTER — Ambulatory Visit: Payer: Self-pay

## 2015-12-29 ENCOUNTER — Encounter: Payer: Self-pay | Admitting: Internal Medicine

## 2015-12-29 ENCOUNTER — Ambulatory Visit: Payer: Self-pay

## 2015-12-30 ENCOUNTER — Ambulatory Visit (INDEPENDENT_AMBULATORY_CARE_PROVIDER_SITE_OTHER): Payer: 59 | Admitting: Internal Medicine

## 2015-12-30 ENCOUNTER — Encounter: Payer: Self-pay | Admitting: Internal Medicine

## 2015-12-30 VITALS — BP 134/53 | HR 91 | Temp 98.6°F | Ht 62.0 in | Wt 161.0 lb

## 2015-12-30 DIAGNOSIS — Z794 Long term (current) use of insulin: Secondary | ICD-10-CM

## 2015-12-30 DIAGNOSIS — E119 Type 2 diabetes mellitus without complications: Secondary | ICD-10-CM | POA: Diagnosis not present

## 2015-12-30 DIAGNOSIS — F418 Other specified anxiety disorders: Secondary | ICD-10-CM

## 2015-12-30 DIAGNOSIS — R1084 Generalized abdominal pain: Secondary | ICD-10-CM

## 2015-12-30 DIAGNOSIS — K5900 Constipation, unspecified: Secondary | ICD-10-CM

## 2015-12-30 DIAGNOSIS — R630 Anorexia: Secondary | ICD-10-CM

## 2015-12-30 DIAGNOSIS — R197 Diarrhea, unspecified: Secondary | ICD-10-CM | POA: Diagnosis not present

## 2015-12-30 DIAGNOSIS — R110 Nausea: Secondary | ICD-10-CM

## 2015-12-30 DIAGNOSIS — F419 Anxiety disorder, unspecified: Secondary | ICD-10-CM

## 2015-12-30 DIAGNOSIS — F329 Major depressive disorder, single episode, unspecified: Secondary | ICD-10-CM

## 2015-12-30 LAB — POCT GLYCOSYLATED HEMOGLOBIN (HGB A1C): HEMOGLOBIN A1C: 8.1

## 2015-12-30 LAB — GLUCOSE, CAPILLARY: GLUCOSE-CAPILLARY: 106 mg/dL — AB (ref 65–99)

## 2015-12-30 MED ORDER — INSULIN GLARGINE 100 UNIT/ML ~~LOC~~ SOLN: SUBCUTANEOUS | Status: DC

## 2015-12-30 MED ORDER — TIOTROPIUM BROMIDE MONOHYDRATE 18 MCG IN CAPS: 18.0000 ug | ORAL_CAPSULE | Freq: Every day | RESPIRATORY_TRACT | Status: DC

## 2015-12-30 MED ORDER — ALPRAZOLAM 0.5 MG PO TABS: 0.5000 mg | ORAL_TABLET | Freq: Every evening | ORAL | Status: DC | PRN

## 2015-12-30 NOTE — Patient Instructions (Addendum)
We have refilled your medication. You will follow up with GI for your colonoscopy and endoscopy. Your primary doctor will follow up with you to check on your DM.  Fish Oil, Omega-3 Fatty Acids capsules (OTC) What is this medicine? FISH OIL, OMEGA-3 FATTY ACIDS (Fish Oil, oh MAY ga - 3 fatty AS ids) are essential fats. It is promoted to help support a healthy heart. This dietary supplement is used to add to a healthy diet. The FDA has not approved this supplement for any medical use. This supplement may be used for other purposes; ask your health care provider or pharmacist if you have questions. This medicine may be used for other purposes; ask your health care provider or pharmacist if you have questions. What should I tell my health care provider before I take this medicine? They need to know if you have any of these conditions -bleeding problems -lung or breathing disease, like asthma -an unusual or allergic reaction to fish oil, omega-3 fatty acids, fish, other medicines, foods, dyes, or preservatives -pregnant or trying to get pregnant -breast-feeding How should I use this medicine? Take this medicine by mouth with a glass of water. Follow the directions on the package or prescription label. Take with food. Take your medicine at regular intervals. Do not take your medicine more often than directed. Talk to your pediatrician regarding the use of this medicine in children. Special care may be needed. This medicine should not be used in children without a doctor's advice. Overdosage: If you think you have taken too much of this medicine contact a poison control center or emergency room at once. NOTE: This medicine is only for you. Do not share this medicine with others. What if I miss a dose? If you miss a dose, take it as soon as you can. If it is almost time for your next dose, take only that dose. Do not take double or extra doses. What may interact with this medicine? -aspirin and  aspirin-like medicines -herbal products like danshen, dong quai, garlic pills, ginger, ginkgo biloba, horse chestnut, willow bark, and others -medicines that treat or prevent blood clots like enoxaparin, heparin, warfarin This list may not describe all possible interactions. Give your health care provider a list of all the medicines, herbs, non-prescription drugs, or dietary supplements you use. Also tell them if you smoke, drink alcohol, or use illegal drugs. Some items may interact with your medicine. What should I watch for while using this medicine? Follow a good diet and exercise plan. Taking a dietary supplement does not replace a healthy lifestyle. Some foods that have omega-3 fatty acids naturally are fatty fish like albacore tuna, halibut, herring, mackerel, lake trout, salmon, and sardines. Too much of this supplement can be unsafe. Talk to your doctor or health care provider about how much of this supplement is right for you. If you are scheduled for any medical or dental procedure, tell your healthcare provider that you are taking this medicine. You may need to stop taking this medicine before the procedure. Herbal or dietary supplements are not regulated like medicines. Rigid quality control standards are not required for dietary supplements. The purity and strength of these products can vary. The safety and effect of this dietary supplement for a certain disease or illness is not well known. This product is not intended to diagnose, treat, cure or prevent any disease. The Food and Drug Administration suggests the following to help consumers protect themselves: -Always read product labels and follow directions. -  Natural does not mean a product is safe for humans to take. -Look for products that include USP after the ingredient name. This means that the manufacturer followed the standards of the [place] Pharmacopoeia. -Supplements made or sold by a nationally known food or drug company are more  likely to be made under tight controls. You can write to the company for more information about how the product was made. What side effects may I notice from receiving this medicine? Side effects that you should report to your doctor or health care professional as soon as possible: -allergic reactions like skin rash, itching or hives, swelling of the face, lips, or tongue -breathing problems -changes in your moods or emotions -unusual bleeding or bruising Side effects that usually do not require medical attention (report to your doctor or health care professional if they continue or are bothersome): -bad or fishy breath -belching -diarrhea -nausea -stomach gas, upset -weight gain This list may not describe all possible side effects. Call your doctor for medical advice about side effects. You may report side effects to FDA at 1-800-FDA-1088. Where should I keep my medicine? Keep out of the reach of children. Store at room temperature or as directed on the package label. Protect from moisture. Do not freeze. Throw away any unused medicine after the expiration date. NOTE: This sheet is a summary. It may not cover all possible information. If you have questions about this medicine, talk to your doctor, pharmacist, or health care provider.    2016, Elsevier/Gold Standard. (2014-10-01 09:36:32)

## 2015-12-30 NOTE — Assessment & Plan Note (Signed)
Pt notes that she needs a refill of Xanax. She reports appropriate use of this medication and has a medication contract on file. Will fill 0.5mg  #60 in order to encourage her to manage anxiety with coping techniques and use medication secondarily.  She reports that she ran out of Cymbalta 2 weeks ago, but has been feeling great w/o it. I will not refill this medication and counseled that the pt should call if her symptoms of depression return. Advise using PHQ9 screen at next appt.

## 2015-12-30 NOTE — Progress Notes (Signed)
CC: DM f/u HPI: Ms. Vanessa [name] is a 55 y.o. female with a h/o of DM, anxiety/depression who presents for f/u DM. She reports that she has recently had evaluation by OBGYN for a large ovarian cyst which demonstrated no positive tumor markers. She also notes that she is planning to schedule UGI and colonoscopy evaluation by GI for evaluation of her chronic GI complaints. She reports that her symptoms are unchanged today.  She has not acute complaints but requests refill of several medications. See A&P for further details.  Past Medical History  Diagnosis Date  . Asthma     No PFT performed  . Diabetes mellitus 2002  . Depression   . Panic attacks     mostly Agaraphobia  . GERD (gastroesophageal reflux disease)   . Helicobacter pylori (H. pylori) infection     s/p triple therapy  . Hyperlipidemia   . Hepatic hemangioma   . Hypertriglyceridemia    Current Outpatient Rx  Name  Route  Sig  Dispense  Refill  . acetaminophen (TYLENOL) 500 MG tablet   Oral   Take 1,000 mg by mouth every 6 (six) hours as needed for mild pain.         Marland Kitchen ALPRAZolam (XANAX) 0.5 MG tablet   Oral   Take 1 tablet (0.5 mg total) by mouth at bedtime as needed for anxiety.   60 tablet   0   . aspirin 81 MG tablet   Oral   Take 1 tablet (81 mg total) by mouth daily.   30 tablet   2   . atorvastatin (LIPITOR) 20 MG tablet   Oral   Take 1 tablet (20 mg total) by mouth daily.   30 tablet   3   . diphenhydrAMINE (BENADRYL) 25 mg capsule   Oral   Take 50 mg by mouth every 6 (six) hours as needed for allergies.         . Fluticasone-Salmeterol (ADVAIR DISKUS) 500-50 MCG/DOSE AEPB   Inhalation   Inhale 1 puff into the lungs every 12 (twelve) hours.   2 each   6   . ibuprofen (ADVIL,MOTRIN) 200 MG tablet   Oral   Take 600 mg by mouth every 6 (six) hours as needed for moderate pain.         Marland Kitchen insulin glargine (LANTUS) 100 UNIT/ML injection      Inject 45 units in the evening .   270 mL  3   . lisinopril (PRINIVIL,ZESTRIL) 5 MG tablet   Oral   Take 1 tablet (5 mg total) by mouth daily.   90 tablet   3   . metFORMIN (GLUCOPHAGE) 1000 MG tablet   Oral   Take 1 tablet (1,000 mg total) by mouth 2 (two) times daily with a meal.   180 tablet   1   . niacin (NIASPAN) 500 MG CR tablet   Oral   Take 1 tablet (500 mg total) by mouth 2 (two) times daily.   180 tablet   3   . omeprazole (PRILOSEC) 20 MG capsule   Oral   Take 1 capsule (20 mg total) by mouth daily.   30 capsule   2   . Simethicone (GAS-X EXTRA STRENGTH PO)   Oral   Take 1 tablet by mouth 3 (three) times daily as needed (gas).         . Canagliflozin-Metformin HCl 50-500 MG TABS   Oral   Take 1 tablet by mouth 2 (  two) times daily. Patient not taking: Reported on 12/30/2015   60 tablet   3   . DULoxetine (CYMBALTA) 60 MG capsule   Oral   Take 1 capsule (60 mg total) by mouth 2 (two) times daily. Patient not taking: Reported on 12/30/2015   180 capsule   3   . Na Sulfate-K Sulfate-Mg Sulf 17.5-3.13-1.6 GM/180ML SOLN   Oral   Take 1 kit by mouth once.   354 mL   0   . tiotropium (SPIRIVA HANDIHALER) 18 MCG inhalation capsule   Inhalation   Place 1 capsule (18 mcg total) into inhaler and inhale daily.   90 capsule   0    Review of Systems: A complete ROS was negative except as per HPI.  Physical Exam: Filed Vitals:   12/30/15 1425  BP: 134/53  Pulse: 91  Temp: 98.6 F (37 C)  TempSrc: Oral  Height: '5\' 2"'$  (1.575 m)  Weight: 161 lb (73.029 kg)  SpO2: 94%   General appearance: alert, cooperative, appears stated age and no distress Head: Normocephalic, without obvious abnormality, atraumatic Lungs: normal work of breathing Abd: protuberant and percussive with some fluid shift noted, non-tender Extremities: extremities normal, atraumatic, no cyanosis or edema  Assessment & Plan:  See encounters tab for problem based medical decision making. Patient seen with Dr.  Dareen Piano  Signed: Holley Raring, MD 12/30/2015, 3:58 PM  Pager: [phone]

## 2015-12-30 NOTE — Assessment & Plan Note (Signed)
HbA1c 8.1 down from 9.5. Not taking Invokamet due to pt preference, taking metformin 1000mg  BID and Lantus qHS. She has lost ~30 lbs recently and wants to continue to attempt diet and lifestyle management to improve BG. Will defer further mangement to PCP. Ordered lipid panel for ASCVD risk evaluation.

## 2015-12-30 NOTE — Assessment & Plan Note (Signed)
Pt symptoms remain unchanged. Intermittent diarrhea/constipation, intermittent nasea and decreased appetite. Likely combination of DM gastroparesis and IBS, but needs definative evaluation with endoscopy. CT scan demonstrates diverticulosis and large ovarian cyst, as well as previously documented hepatomegaly and hepatic steatosis. Seen by GI w/ plans to schedule UGI and C-scope. No further management indicated today.

## 2015-12-31 LAB — LIPID PANEL
CHOL/HDL RATIO: 5.8 ratio — AB (ref 0.0–4.4)
Cholesterol, Total: 151 mg/dL (ref 100–199)
HDL: 26 mg/dL — AB (ref >39)
LDL CALC: 90 mg/dL (ref 0–99)
Triglycerides: 175 mg/dL — ABNORMAL HIGH (ref 0–149)
VLDL CHOLESTEROL CAL: 35 mg/dL (ref 5–40)

## 2016-01-03 NOTE — Progress Notes (Signed)
Internal Medicine Clinic Attending  I saw and evaluated the patient.  I personally confirmed the key portions of the history and exam documented by Dr. Strelow and I reviewed pertinent patient test results.  The assessment, diagnosis, and plan were formulated together and I agree with the documentation in the resident's note. 

## 2016-01-12 ENCOUNTER — Telehealth: Payer: Self-pay | Admitting: Internal Medicine

## 2016-01-12 ENCOUNTER — Encounter: Payer: Self-pay | Admitting: Internal Medicine

## 2016-01-12 MED ORDER — NA SULFATE-K SULFATE-MG SULF 17.5-3.13-1.6 GM/177ML PO SOLN: 1.0000 | Freq: Once | ORAL | Status: AC

## 2016-01-12 NOTE — Telephone Encounter (Signed)
Prescription sent to pharmacy. Pt aware. 

## 2016-01-27 ENCOUNTER — Other Ambulatory Visit: Payer: Self-pay | Admitting: *Deleted

## 2016-01-27 DIAGNOSIS — J449 Chronic obstructive pulmonary disease, unspecified: Secondary | ICD-10-CM

## 2016-01-27 NOTE — Telephone Encounter (Signed)
Please call unable to get meds.

## 2016-02-01 MED ORDER — TIOTROPIUM BROMIDE MONOHYDRATE 18 MCG IN CAPS
18.0000 ug | ORAL_CAPSULE | Freq: Every day | RESPIRATORY_TRACT | 0 refills | Status: DC
Start: 2016-02-01 — End: 2016-04-21

## 2016-02-01 MED ORDER — ALBUTEROL SULFATE HFA 108 (90 BASE) MCG/ACT IN AERS: 1.0000 | INHALATION_SPRAY | RESPIRATORY_TRACT | 0 refills | Status: DC | PRN

## 2016-02-02 NOTE — Telephone Encounter (Signed)
Notified patient.

## 2016-02-21 ENCOUNTER — Telehealth: Payer: Self-pay | Admitting: Internal Medicine

## 2016-02-21 ENCOUNTER — Ambulatory Visit (INDEPENDENT_AMBULATORY_CARE_PROVIDER_SITE_OTHER): Payer: 59 | Admitting: Internal Medicine

## 2016-02-21 VITALS — BP 123/54 | HR 104 | Temp 97.7°F | Ht 62.0 in | Wt 163.0 lb

## 2016-02-21 DIAGNOSIS — L299 Pruritus, unspecified: Secondary | ICD-10-CM

## 2016-02-21 DIAGNOSIS — I10 Essential (primary) hypertension: Secondary | ICD-10-CM

## 2016-02-21 DIAGNOSIS — J449 Chronic obstructive pulmonary disease, unspecified: Secondary | ICD-10-CM | POA: Diagnosis not present

## 2016-02-21 DIAGNOSIS — D751 Secondary polycythemia: Secondary | ICD-10-CM

## 2016-02-21 LAB — SAVE SMEAR

## 2016-02-21 LAB — TECHNOLOGIST SMEAR REVIEW

## 2016-02-21 MED ORDER — HYDROXYZINE HCL 25 MG PO TABS: 25.0000 mg | ORAL_TABLET | Freq: Three times a day (TID) | ORAL | 0 refills | Status: AC | PRN

## 2016-02-21 NOTE — Progress Notes (Signed)
   CC: itching since Friday.  HPI:  [name] is a 55 y.o. female who presents to the clinic today for evaluation of itching that began Friday evening around 9 pm. The patient reports that Friday evening she noticed she was breaking out in welts/hives on her hands, under her breasts, behind ears, back of legs and behind her knees. She reports they were itching an painful as well. Since that time her welts have nearly disappeared however she still complains of itching. She has tried Benadryl which did not seem to provide relief until today, calamine lotion and jock itch spray. Patient reports that just prior to symptom onset she took a Tylenol but has never had a problem with this medication before.  Patient denies any new foods, new medications, new body products or new detergents since symptom onset. She reports a distant history of hives when she was a teenager and reports she was allergic to many different foods" at that time but they have all gone away with time.  Per patient she wonders if her anxiety could be a trigger for her hives. She reports anxiety about her upcoming colonoscopy which was scheduled for tomorrow. She called an cancelled her appointment due to her hives and itching.    Past Medical History:  Diagnosis Date  . Asthma    No PFT performed  . Depression   . Diabetes mellitus 2002  . GERD (gastroesophageal reflux disease)   . Helicobacter pylori (H. pylori) infection    s/p triple therapy  . Hepatic hemangioma   . Hyperlipidemia   . Hypertriglyceridemia   . Panic attacks    mostly Agaraphobia    Review of Systems:  Review of Systems  Constitutional: Positive for malaise/fatigue. Negative for chills and fever.  HENT: Negative for sore throat.   Respiratory: Negative for cough, shortness of breath, wheezing and stridor.   Cardiovascular: Positive for chest pain. Negative for leg swelling.  Gastrointestinal: Negative for abdominal pain, diarrhea, nausea and  vomiting.       Denis dysphagia  Skin: Positive for itching and rash.  Neurological: Negative for headaches.    Physical Exam: Physical Exam  Constitutional: She is well-developed, well-nourished, and in no distress.  HENT:  Head: Normocephalic and atraumatic.  Xanthelasma present on BL eyelids  Cardiovascular: Regular rhythm.  Tachycardia present.  Exam reveals no gallop and no friction rub.   Pulmonary/Chest: Effort normal and breath sounds normal. No respiratory distress.  Abdominal: Soft. Bowel sounds are normal. She exhibits no distension. There is no tenderness.  Musculoskeletal: She exhibits edema.  BL edema of hands.   Skin: Skin is warm and dry. Rash noted. She is not diaphoretic.  Slight redness on and under bilateral breasts.     Vitals:   02/21/16 1501  BP: (!) 123/54  Pulse: (!) 104  Temp: 97.7 F (36.5 C)  TempSrc: Oral  SpO2: 93%  Weight: 163 lb (73.9 kg)  Height: 5\' 2"  (1.575 m)    Assessment & Plan:   See Encounters Tab for problem based charting.  Patient seen with Dr. Lynnae January

## 2016-02-21 NOTE — Telephone Encounter (Signed)
No charge. 

## 2016-02-21 NOTE — Assessment & Plan Note (Signed)
The patient does have a several year history of elevated platelets, Hb and WBCs. This is concerning for polycythemia vera however patient reports that showering actually improves her itching.  -Patient is currently on ASA 81 mg daily -CBC with differential and a peripheral smear ordered for evaluation.  -Anemia panel ordered as well to further evaluate her iron status -Further workup for Polycythemia vera should include Epo, JAK2 and CarboxyHb.

## 2016-02-21 NOTE — Patient Instructions (Addendum)
It was a pleasure meeting you today!  1. Today we talked about your itching. I am sending you a prescription for Hydroxyzine. This is a great medication sometimes used to treat itching. Please take this medication as directed. This medicine can cause drowsiness.  2. Please keep a diary over the next several weeks so that if this happens again we can help determine the cause. 3. When going through your past lab results, I noticed you've had elevated platelets before. This could be contributing to your itching. For evaluation of this I have ordered several different laboratory tests. I will call you with the results of them.  4. Please continue to take all medications as directed. 5. We will see you back in the clinic for follow up in 1 month to go over your results. Please come in sooner if your itching does not improve in 1 week.

## 2016-02-21 NOTE — Progress Notes (Signed)
Internal Medicine Clinic Attending  I saw and evaluated the patient.  I personally confirmed the key portions of the history and exam documented by Dr. Molt and I reviewed pertinent patient test results.  The assessment, diagnosis, and plan were formulated together and I agree with the documentation in the resident's note. 

## 2016-02-21 NOTE — Assessment & Plan Note (Addendum)
Per pt she smokes 1/2 pk per day and has for many years. Her pulse ox today in clinic was 93%. Per patient she reports she is usually this low or even lower however when I offered to evaluate for oxygen in the clinic she adamantly refused. She stated that even if the results came back that she needed oxygen she would never wear it.  -She firmly refused evaluation for home oxygen -Importance of smoking cessation was discussed with the patient.  -Continue Albuterol, Advair and Spiriva.

## 2016-02-21 NOTE — Assessment & Plan Note (Signed)
Patient reports a 3 day history of welts and itching most prominent on her hands, under her breasts, behind her knees and on her posterior thighs. She has tried Benadryl since symptom onset without relief until this morning. She also tried calamine lotion and jock itch spray without relief as well. She denies any new foods, medications, new body products or new detergents however reports that her symptoms began shortly after taking Tylenol. She reports she takes this medication occasionally and has never had a problem with it. This is likely an allergic reaction. She has had no new hives since they originally appeared. I encouraged patient to keep a diary of the foods and products she is using so that we can better trace what is causing her itching.  -Hydroxyzine 25 mg Q8H PRN itching ordered -Encouraged to keep an itch diary

## 2016-02-21 NOTE — Assessment & Plan Note (Signed)
BP under control today at 123/54.  -Continue Lisinopril 5mg .

## 2016-02-22 ENCOUNTER — Encounter: Payer: Self-pay | Admitting: Internal Medicine

## 2016-02-22 LAB — CBC WITH DIFFERENTIAL/PLATELET
BASOS ABS: 0 10*3/uL (ref 0.0–0.1)
BASOS PCT: 0 %
EOS ABS: 0.4 10*3/uL (ref 0.0–0.7)
EOS PCT: 3 %
HCT: 45.8 % (ref 36.0–46.0)
Hemoglobin: 15.2 g/dL — ABNORMAL HIGH (ref 12.0–15.0)
Lymphocytes Relative: 29 %
Lymphs Abs: 3.4 10*3/uL (ref 0.7–4.0)
MCH: 31.2 pg (ref 26.0–34.0)
MCHC: 33.2 g/dL (ref 30.0–36.0)
MCV: 94 fL (ref 78.0–100.0)
Monocytes Absolute: 0.6 10*3/uL (ref 0.1–1.0)
Monocytes Relative: 5 %
Neutro Abs: 7.4 10*3/uL (ref 1.7–7.7)
Neutrophils Relative %: 63 %
PLATELETS: 461 10*3/uL — AB (ref 150–400)
RBC: 4.87 MIL/uL (ref 3.87–5.11)
RDW: 13.4 % (ref 11.5–15.5)
WBC: 11.8 10*3/uL — AB (ref 4.0–10.5)

## 2016-02-22 LAB — ANEMIA PANEL
FERRITIN: 155 ng/mL — AB (ref 15–150)
HEMATOCRIT: 43.4 % (ref 34.0–46.6)
Iron Saturation: 25 % (ref 15–55)
Iron: 64 ug/dL (ref 27–159)
Retic Ct Pct: 1.1 % (ref 0.6–2.6)
Total Iron Binding Capacity: 258 ug/dL (ref 250–450)
UIBC: 194 ug/dL (ref 131–425)
Vitamin B-12: 313 pg/mL (ref 211–946)

## 2016-02-22 NOTE — Addendum Note (Signed)
Addended by: Orson Gear on: 02/22/2016 09:20 AM   Modules accepted: Orders

## 2016-02-23 LAB — ANEMIA PROFILE B
BASOS: 0 %
Basophils Absolute: 0 10*3/uL (ref 0.0–0.2)
EOS (ABSOLUTE): 0.3 10*3/uL (ref 0.0–0.4)
EOS: 3 %
Ferritin: 155 ng/mL — ABNORMAL HIGH (ref 15–150)
Folate: 7.7 ng/mL (ref >3.0)
HEMOGLOBIN: 15.1 g/dL (ref 11.1–15.9)
Hematocrit: 44.1 % (ref 34.0–46.6)
IMMATURE GRANS (ABS): 0 10*3/uL (ref 0.0–0.1)
IMMATURE GRANULOCYTES: 0 %
IRON SATURATION: 25 % (ref 15–55)
Iron: 64 ug/dL (ref 27–159)
Lymphocytes Absolute: 3 10*3/uL (ref 0.7–3.1)
Lymphs: 29 %
MCH: 30.7 pg (ref 26.6–33.0)
MCHC: 34.2 g/dL (ref 31.5–35.7)
MCV: 90 fL (ref 79–97)
MONOCYTES: 4 %
Monocytes Absolute: 0.4 10*3/uL (ref 0.1–0.9)
NEUTROS PCT: 64 %
Neutrophils Absolute: 6.6 10*3/uL (ref 1.4–7.0)
Platelets: 438 10*3/uL — ABNORMAL HIGH (ref 150–379)
RBC: 4.92 x10E6/uL (ref 3.77–5.28)
RDW: 14 % (ref 12.3–15.4)
RETIC CT PCT: 1.1 % (ref 0.6–2.6)
Total Iron Binding Capacity: 258 ug/dL (ref 250–450)
UIBC: 194 ug/dL (ref 131–425)
Vitamin B-12: 313 pg/mL (ref 211–946)
WBC: 10.5 10*3/uL (ref 3.4–10.8)

## 2016-02-23 LAB — SPECIMEN STATUS REPORT

## 2016-02-29 ENCOUNTER — Telehealth: Payer: Self-pay

## 2016-02-29 NOTE — Telephone Encounter (Signed)
Request sent to her doctor.

## 2016-02-29 NOTE — Telephone Encounter (Signed)
Requesting lab result. 

## 2016-03-02 ENCOUNTER — Telehealth: Payer: Self-pay | Admitting: *Deleted

## 2016-03-02 NOTE — Telephone Encounter (Signed)
Pt calling again - requesting test results.

## 2016-03-02 NOTE — Telephone Encounter (Signed)
Dr. Heber Elba - -   Please call patient back today with lab results.  Thank you.   Gilles Chiquito, MD

## 2016-03-03 NOTE — Telephone Encounter (Signed)
Pt calling concerned about her lab results and polycythemia dx.  Pt wishes to speak to MD.Goldston, Darlene Cassady9/8/201710:12 AM  (Pt will also make an appt to be seen in [facility] early next week if she does not hear anything by the end of the day to address her swelling)

## 2016-03-06 ENCOUNTER — Encounter: Payer: Self-pay | Admitting: *Deleted

## 2016-03-06 ENCOUNTER — Ambulatory Visit (INDEPENDENT_AMBULATORY_CARE_PROVIDER_SITE_OTHER): Payer: 59 | Admitting: Internal Medicine

## 2016-03-06 DIAGNOSIS — D751 Secondary polycythemia: Secondary | ICD-10-CM

## 2016-03-06 DIAGNOSIS — F17210 Nicotine dependence, cigarettes, uncomplicated: Secondary | ICD-10-CM | POA: Diagnosis not present

## 2016-03-06 DIAGNOSIS — R718 Other abnormality of red blood cells: Secondary | ICD-10-CM

## 2016-03-06 DIAGNOSIS — Z794 Long term (current) use of insulin: Secondary | ICD-10-CM

## 2016-03-06 DIAGNOSIS — E119 Type 2 diabetes mellitus without complications: Secondary | ICD-10-CM

## 2016-03-06 DIAGNOSIS — J449 Chronic obstructive pulmonary disease, unspecified: Secondary | ICD-10-CM

## 2016-03-06 MED ORDER — ALBUTEROL SULFATE HFA 108 (90 BASE) MCG/ACT IN AERS: 1.0000 | INHALATION_SPRAY | RESPIRATORY_TRACT | 0 refills | Status: DC | PRN

## 2016-03-06 MED ORDER — ALPRAZOLAM 0.5 MG PO TABS: 0.5000 mg | ORAL_TABLET | Freq: Every evening | ORAL | 0 refills | Status: DC | PRN

## 2016-03-06 MED ORDER — GABAPENTIN 300 MG PO CAPS: 300.0000 mg | ORAL_CAPSULE | Freq: Every day | ORAL | 2 refills | Status: DC

## 2016-03-06 NOTE — Telephone Encounter (Signed)
I believe Dr. Danford Bad saw her in clinic for lab and polycythemia dx.  Could you please let her know that patient is requesting results.  Thank you

## 2016-03-06 NOTE — Assessment & Plan Note (Addendum)
Endorsing pain that occurs nightly in her toes and fingers, varies from aching, sharp, burning, tingling, associated with swelling occasionally. Also mentions intermittent episodes of waking up with painful cold toes. Likely peripheral neuropathy from poorly-controlled DM with a possible component of undiagnosed peripheral arterial disease. Sensation grossly intact in hands and feet, no ulcer present, patient had to leave before monofilament testing could take place.  Plan: - Provided prescription for Gabapentin 300 mg QHS - Should obtain ABI at next visit - Requires formal diabetic foot exam, monofilament testing

## 2016-03-06 NOTE — Progress Notes (Signed)
   CC: Pain of hands and feet  HPI:  [name] is a 55 y.o. female with PMHx detailed below presenting for ongoing pain in her hands and feet that is most pronounced at night.    See problem based assessment and plan below for additional details.  Past Medical History:  Diagnosis Date  . Asthma    No PFT performed  . Depression   . Diabetes mellitus 2002  . GERD (gastroesophageal reflux disease)   . Helicobacter pylori (H. pylori) infection    s/p triple therapy  . Hepatic hemangioma   . Hyperlipidemia   . Hypertriglyceridemia   . Panic attacks    mostly Agaraphobia    Review of Systems: Review of Systems  Constitutional: Negative for chills and fever.  Respiratory: Positive for cough and shortness of breath.   Cardiovascular: Negative for chest pain, palpitations and orthopnea.  Gastrointestinal: Negative for abdominal pain.  Musculoskeletal: Positive for joint pain.  Neurological: Positive for tingling and sensory change.     Physical Exam: Vitals:   03/06/16 1350  BP: (!) 130/55  Pulse: (!) 107  Temp: 98 F (36.7 C)  TempSrc: Oral  SpO2: 94%  Weight: 164 lb 3.2 oz (74.5 kg)  Height: 5\' 2"  (1.575 m)   GENERAL- Woman sitting comfortably in exam room chair, alert, in no distress, appears older than stated age HEENT- Atraumatic, PERRL, EOMI, moist mucous membranes CARDIAC- Regular rate and rhythm, no murmurs, rubs or gallops. RESP- Decreased breath sounds bilaterally, mildly prolonged expiratory phase, no wheezing or crackles, normal work of breathing ABDOMEN- Normoactive bowel sounds, soft, nontender, mildly distended abdomen NEURO- Alert and oriented, cranial nerves grossly intact, sensation grossly intact over distal extremities EXTREMITIES- Normal bulk and range of motion, no edema, 2+ peripheral pulses, diffusely prominent superficial veins over extremities, increased when held in gravitationally-dependent position SKIN- Warm, dry, intact, without  visible rash or ulcer PSYCH- Guarded affect, clear speech, easily frustrated and impatient  Assessment & Plan:   See encounters tab for problem based medical decision making.  Patient seen with Dr. Daryll Drown

## 2016-03-06 NOTE — Patient Instructions (Signed)
Please continue to take your mediations and inhalers as prescribed.

## 2016-03-06 NOTE — Assessment & Plan Note (Addendum)
CBC check at previous visit, Hb 15.2 (upper limit of normal for female patient), not above 16.0. Does not meet diagnostic criteria for polycythemia vera. Additionally, peripheral smear showed normal RBC morphology and iron panel was normal. 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 Patient states that her hives have not returned since that initial episode and her skin is rarely itchy (although takes hydroxyzine daily).

## 2016-03-06 NOTE — Assessment & Plan Note (Addendum)
Patient with apparent clinical diagnosis of COPD, extensive smoking history, prescribed three inhalers (Spiriva, Advair, Albuterol) but no formal PFTs visible in our system. Continues to smoke 1 pack per day, has never attempted to quit smoking, some potential interest in Wellbutrin to help quit. Has significant difficulty affording her current inhalers with her insurance, only really taking Albuterol, just ran out of Spiriva. States she cannot afford Spiriva or Advair. Requesting refill of ventolin inhaler. Decreased breath sounds bilaterally on exam, SpO2 92-94% on RA. Concern for diffuse prominent veins over arms, legs, questionable JVD. Potentially consistent with component of pulmonary hypertension and high right-sided heart pressures as an additional cause to for her respiratory symptoms. Patient very frustrated and left before much progress could be made today on these issues.   Plan: - Pharmacy to check for more-affordable alternatives to her current inhalers - Continue to recommend and work toward smoking cessation, perhaps amenable to attempt with NRT + Wellbutrin - Needs formal PFT testing - Should obtain TTE in future to evaluate for pulmonary hyptertension

## 2016-03-08 NOTE — Telephone Encounter (Signed)
Called pt - stated doctor talked to her about test results on Monday at her appt.

## 2016-03-14 NOTE — Progress Notes (Signed)
Internal Medicine Clinic Attending  I saw and evaluated the patient.  I personally confirmed the key portions of the history and exam documented by Dr. Johnson and I reviewed pertinent patient test results.  The assessment, diagnosis, and plan were formulated together and I agree with the documentation in the resident's note.  

## 2016-03-21 ENCOUNTER — Ambulatory Visit: Payer: Self-pay | Admitting: Pharmacist

## 2016-03-23 ENCOUNTER — Ambulatory Visit: Payer: Self-pay | Admitting: Pharmacist

## 2016-04-03 ENCOUNTER — Ambulatory Visit: Payer: 59 | Admitting: Pharmacist

## 2016-04-03 DIAGNOSIS — Z719 Counseling, unspecified: Secondary | ICD-10-CM

## 2016-04-03 DIAGNOSIS — Z79899 Other long term (current) drug therapy: Secondary | ICD-10-CM

## 2016-04-03 NOTE — Progress Notes (Signed)
S: Vanessa [name] is a 55 y.o. female reports to clinical pharmacist appointment for help with inhalers.   Allergies  Allergen Reactions  . Rosiglitazone Maleate     (Avandia) REACTION: Headaches   Medication Sig  acetaminophen (TYLENOL) 500 MG tablet Take 1,000 mg by mouth every 6 (six) hours as needed for mild pain.  albuterol (PROVENTIL HFA;VENTOLIN HFA) 108 (90 Base) MCG/ACT inhaler Inhale 1-2 puffs into the lungs every 4 (four) hours as needed for wheezing.  ALPRAZolam (XANAX) 0.5 MG tablet Take 1 tablet (0.5 mg total) by mouth at bedtime as needed for anxiety.  aspirin 81 MG tablet Take 1 tablet (81 mg total) by mouth daily.  atorvastatin (LIPITOR) 20 MG tablet Take 1 tablet (20 mg total) by mouth daily.  Canagliflozin-Metformin HCl 50-500 MG TABS Take 1 tablet by mouth 2 (two) times daily. Patient not taking: Reported on 12/30/2015  diphenhydrAMINE (BENADRYL) 25 mg capsule Take 50 mg by mouth every 6 (six) hours as needed for allergies.  DULoxetine (CYMBALTA) 60 MG capsule Take 1 capsule (60 mg total) by mouth 2 (two) times daily. Patient not taking: Reported on 12/30/2015  Fluticasone-Salmeterol (ADVAIR DISKUS) 500-50 MCG/DOSE AEPB Inhale 1 puff into the lungs every 12 (twelve) hours.  gabapentin (NEURONTIN) 300 MG capsule Take 1 capsule (300 mg total) by mouth at bedtime.  ibuprofen (ADVIL,MOTRIN) 200 MG tablet Take 600 mg by mouth every 6 (six) hours as needed for moderate pain.  insulin glargine (LANTUS) 100 UNIT/ML injection Inject 45 units in the evening .  lisinopril (PRINIVIL,ZESTRIL) 5 MG tablet Take 1 tablet (5 mg total) by mouth daily.  metFORMIN (GLUCOPHAGE) 1000 MG tablet Take 1 tablet (1,000 mg total) by mouth 2 (two) times daily with a meal.  niacin (NIASPAN) 500 MG CR tablet Take 1 tablet (500 mg total) by mouth 2 (two) times daily.  omeprazole (PRILOSEC) 20 MG capsule Take 1 capsule (20 mg total) by mouth daily.  Simethicone (GAS-X EXTRA STRENGTH PO) Take 1 tablet by mouth  3 (three) times daily as needed (gas).  tiotropium (SPIRIVA HANDIHALER) 18 MCG inhalation capsule Place 1 capsule (18 mcg total) into inhaler and inhale daily.   Past Medical History:  Diagnosis Date  . Asthma    No PFT performed  . Depression   . Diabetes mellitus 2002  . GERD (gastroesophageal reflux disease)   . Helicobacter pylori (H. pylori) infection    s/p triple therapy  . Hepatic hemangioma   . Hyperlipidemia   . Hypertriglyceridemia   . Panic attacks    mostly Agaraphobia   Social History   Social History  . Marital status: Married    Spouse name: N/A  . Number of children: N/A  . Years of education: N/A   Social History Main Topics  . Smoking status: Current Every Day Smoker    Packs/day: 1.00    Years: 16.00    Types: Cigarettes  . Smokeless tobacco: Never Used     Comment: trying to cut back to 1/2 ppd  . Alcohol use No     Comment: none  . Drug use: No  . Sexual activity: Not Currently   Other Topics Concern  . Not on file   Social History Narrative   Drinks at least a pot of coffee every day.    Family History  Problem Relation Age of Onset  . Colon polyps Mother     benign  . Ovarian cancer Mother   . Breast cancer Maternal Aunt   .  Diabetes Maternal Uncle   . Ovarian cancer Maternal Aunt     O:    Component Value Date/Time   CHOL 151 12/30/2015 1543   HDL 26 (L) 12/30/2015 1543   TRIG 175 (H) 12/30/2015 1543   AST 13 12/09/2015 1005   ALT 16 12/09/2015 1005   NA 135 12/09/2015 1005   K 4.6 12/09/2015 1005   CL 100 12/09/2015 1005   CO2 27 12/09/2015 1005   GLUCOSE 211 (H) 12/09/2015 1005   HGBA1C 8.1 12/30/2015 1456   HGBA1C 9.6 03/01/2010 1543   BUN 6 12/09/2015 1005   CREATININE 0.58 12/09/2015 1005   CREATININE 0.45 (L) 12/03/2014 1542   CALCIUM 9.5 12/09/2015 1005   GFRNONAA >89 12/03/2014 1542   WBC 10.5 02/21/2016 1612   WBC 11.8 (H) 02/21/2016 1609   HGB 15.2 (H) 02/21/2016 1609   HCT 43.4 02/21/2016 1612   HCT 44.1  02/21/2016 1612   PLT 438 (H) 02/21/2016 1612   TSH 3.75 12/09/2015 1005   Ht Readings from Last 2 Encounters:  03/06/16 5\' 2"  (1.575 m)  02/21/16 5\' 2"  (1.575 m)   Wt Readings from Last 2 Encounters:  03/06/16 164 lb 3.2 oz (74.5 kg)  02/21/16 163 lb (73.9 kg)   There is no height or weight on file to calculate BMI. BP Readings from Last 3 Encounters:  03/06/16 (!) 130/55  02/21/16 (!) 123/54  12/30/15 (!) 134/53    A/P: No symptoms of shortness of breath at this time. Discussing possibility of switching to nebulizer solution with PCP (patient states she has a nebulizer machine at home).  Medication Samples have been provided to the patient.  Drug: tiotropium (Spiriva) Strength: 2.5 mcg/puff Qty: 1 [id number] Exp.Date: 10/18 Dosing instructions: 2 puffs daily Drug: mometasone-formoterol Ruthe Mannan) Strength: 100/5 mcg Qty: 2 [id number] Exp.Date: 07/06/16 Dosing instructions: 2 puffs BID  The patient has been instructed regarding the correct time, dose, and frequency of taking this medication, including desired effects and most common side effects.   Kim,Jennifer J 3:08 PM 04/03/2016  Patient has PCP appointment on 05/25/16  An after visit summary was provided and patient advised to follow up if any changes in condition or questions regarding medications arise.   The patient verbalized understanding of information provided by repeating back concepts discussed.   15 minutes spent face-to-face with the patient during the encounter. 50% of time spent on education. 50% of time was spent on assessment and plan.

## 2016-04-05 ENCOUNTER — Other Ambulatory Visit: Payer: Self-pay | Admitting: Internal Medicine

## 2016-04-05 NOTE — Telephone Encounter (Signed)
albuterol (PROVENTIL HFA;VENTOLIN HFA) 108 (90 Base) MCG/ACT inhaler [facility]

## 2016-04-11 MED ORDER — ALBUTEROL SULFATE HFA 108 (90 BASE) MCG/ACT IN AERS: 1.0000 | INHALATION_SPRAY | RESPIRATORY_TRACT | 3 refills | Status: DC | PRN

## 2016-04-21 ENCOUNTER — Other Ambulatory Visit: Payer: Self-pay | Admitting: Pharmacist

## 2016-04-21 DIAGNOSIS — J449 Chronic obstructive pulmonary disease, unspecified: Secondary | ICD-10-CM

## 2016-04-21 MED ORDER — BUDESONIDE 0.5 MG/2ML IN SUSP: 0.5000 mg | Freq: Two times a day (BID) | RESPIRATORY_TRACT | 3 refills | Status: DC

## 2016-04-21 MED ORDER — IPRATROPIUM-ALBUTEROL 0.5-2.5 (3) MG/3ML IN SOLN: 3.0000 mL | Freq: Four times a day (QID) | RESPIRATORY_TRACT | 3 refills | Status: DC

## 2016-04-21 NOTE — Progress Notes (Signed)
Interchange inhalers to nebulizer solutions per PCP approval. Patient was education on use, states she has a nebulizer machine at home.

## 2016-04-27 ENCOUNTER — Encounter: Payer: Self-pay | Admitting: Internal Medicine

## 2016-05-24 ENCOUNTER — Telehealth: Payer: Self-pay | Admitting: Internal Medicine

## 2016-05-24 NOTE — Telephone Encounter (Signed)
APT. REMINDER CALL, LMTCB °

## 2016-05-24 NOTE — Progress Notes (Deleted)
   CC: ***  HPI:  Ms.Lillyanna L Quiles is a 55 y.o.   Past Medical History:  Diagnosis Date  . Asthma    No PFT performed  . Depression   . Diabetes mellitus 2002  . GERD (gastroesophageal reflux disease)   . Helicobacter pylori (H. pylori) infection    s/p triple therapy  . Hepatic hemangioma   . Hyperlipidemia   . Hypertriglyceridemia   . Panic attacks    mostly Agaraphobia    Review of Systems:  ***  Physical Exam:  There were no vitals filed for this visit. ***  Assessment & Plan:   See encounters tab for problem based medical decision making.  Assessment: COPD Plan -PFT -Assess smoking cessation, patient smokes half a pack per day - Assess inhalers  Assessment: Essential hypertension Patient is on lisinopril 5 mg. Blood pressure today was***  Plan -Continue lisinopril 5 mg  Assessment: Diabetes type 2 Last A1c was 7/6 and was 8.1. We will get A1c today. Patient is taking***     Patient {GC/GE:[id number]::"discussed with","seen with"} Dr. {NAMES:[id number]::"Butcher","Granfortuna","E. Jennessa Trigo","Klima","Mullen","Narendra","Vincent"}

## 2016-05-25 ENCOUNTER — Ambulatory Visit: Payer: Self-pay | Admitting: Dietician

## 2016-05-25 ENCOUNTER — Encounter: Payer: Self-pay | Admitting: Internal Medicine

## 2016-05-29 ENCOUNTER — Ambulatory Visit: Payer: Self-pay

## 2016-05-29 ENCOUNTER — Ambulatory Visit: Payer: Self-pay | Admitting: Dietician

## 2016-05-29 ENCOUNTER — Encounter: Payer: Self-pay | Admitting: Internal Medicine

## 2016-06-05 ENCOUNTER — Other Ambulatory Visit: Payer: Self-pay | Admitting: Internal Medicine

## 2016-06-07 ENCOUNTER — Other Ambulatory Visit: Payer: Self-pay | Admitting: Internal Medicine

## 2016-06-07 NOTE — Telephone Encounter (Signed)
Please phone this in. Thank you!

## 2016-06-07 NOTE — Telephone Encounter (Signed)
Xanax rx called to [facility]. 

## 2016-06-26 HISTORY — PX: UPPER GASTROINTESTINAL ENDOSCOPY: SHX188

## 2016-06-26 HISTORY — PX: COLONOSCOPY: SHX174

## 2016-07-03 ENCOUNTER — Other Ambulatory Visit: Payer: Self-pay | Admitting: Internal Medicine

## 2016-07-03 DIAGNOSIS — I10 Essential (primary) hypertension: Secondary | ICD-10-CM

## 2016-08-02 ENCOUNTER — Other Ambulatory Visit: Payer: Self-pay | Admitting: *Deleted

## 2016-08-02 DIAGNOSIS — Z794 Long term (current) use of insulin: Principal | ICD-10-CM

## 2016-08-02 DIAGNOSIS — E119 Type 2 diabetes mellitus without complications: Secondary | ICD-10-CM

## 2016-08-02 MED ORDER — METFORMIN HCL 1000 MG PO TABS
1000.0000 mg | ORAL_TABLET | Freq: Two times a day (BID) | ORAL | 1 refills | Status: DC
Start: 2016-08-02 — End: 2016-12-06

## 2016-08-07 ENCOUNTER — Other Ambulatory Visit: Payer: Self-pay | Admitting: Internal Medicine

## 2016-08-24 ENCOUNTER — Encounter: Payer: Self-pay | Admitting: Internal Medicine

## 2016-10-03 ENCOUNTER — Ambulatory Visit (INDEPENDENT_AMBULATORY_CARE_PROVIDER_SITE_OTHER): Payer: 59 | Admitting: Gastroenterology

## 2016-10-03 ENCOUNTER — Encounter: Payer: Self-pay | Admitting: Gastroenterology

## 2016-10-03 VITALS — BP 112/66 | HR 100 | Ht 62.25 in | Wt 155.4 lb

## 2016-10-03 DIAGNOSIS — R112 Nausea with vomiting, unspecified: Secondary | ICD-10-CM

## 2016-10-03 DIAGNOSIS — R197 Diarrhea, unspecified: Secondary | ICD-10-CM

## 2016-10-03 DIAGNOSIS — K5900 Constipation, unspecified: Secondary | ICD-10-CM

## 2016-10-03 DIAGNOSIS — R140 Abdominal distension (gaseous): Secondary | ICD-10-CM

## 2016-10-03 DIAGNOSIS — Z1211 Encounter for screening for malignant neoplasm of colon: Secondary | ICD-10-CM

## 2016-10-03 NOTE — Progress Notes (Signed)
10/03/2016 ATARAH CADOGAN [id number] April 02, 1961   HISTORY OF PRESENT ILLNESS:  A 56 year old female who is a patient of Dr. Blanch Media. She was last seen in our office in June 2017 by myself. When she was here she had multiple GI complaints and those were actually the exact same complaints that she comes with again today. These include constant nausea, intermittent vomiting, diffuse abdominal pain and bloating, belching, weight loss, alternating bowel habits between constipation and diarrhea. She has past medical history of insulin-dependent diabetes mellitus for 15 years (was previously on insulin but is not at this time), hyperlipidemia, asthma, anxiety/depression, ongoing tobacco abuse.  She says that most of the symptoms have been present for the past 5 or 6 years but just continuing to worsen. She complains of diffuse abdominal pain but says that it is most tender around her periumbilical region. Says that she has a large bulge in that areas as well. She has a lot of nausea every day.  Vomits randomly but is never undigested food; usually vomits "hot liquid". She has lost 10 more pounds since her visit here in June 2017 (says total of 45-50 pounds over the past couple of years).  Her husband says that she is living off of coffee and cigarettes. She complains of explosive diarrhea with urgency at times and states that she's had several episodes of incontinence with this. This alternates with constipation, sometimes 3-4 days with no bowel movement. She denies seeing blood in her stool.  Complains of constant bloating sensation in her abdomen. She's never had colonoscopy or endoscopy in the past.  At her visit in June 2017 she was scheduled for EGD and colonoscopy, which she cancelled and never had performed.  She was also scheduled for a CT scan of the abdomen and pelvis with showed diverticulosis and a large right ovarian cyst that measured 7.2 cm.  She was advised to follow-up with GYN as well.  She has  been seeing Dr. Ronita Hipps and they have been monitoring it, but no surgery has been done since it is very likely benign.     Past Medical History:  Diagnosis Date  . Asthma    No PFT performed  . Depression   . Diabetes mellitus 2002  . GERD (gastroesophageal reflux disease)   . Helicobacter pylori (H. pylori) infection    s/p triple therapy  . Hepatic hemangioma   . Hyperlipidemia   . Hypertriglyceridemia   . Panic attacks    mostly Agaraphobia   Past Surgical History:  Procedure Laterality Date  . CESAREAN SECTION     with 3 children  . OVARIAN CYST REMOVAL    . TUBAL LIGATION      reports that she has been smoking Cigarettes.  She has a 16.00 pack-year smoking history. She has never used smokeless tobacco. She reports that she does not drink alcohol or use drugs. family history includes Breast cancer in her maternal aunt; Colon polyps in her mother; Diabetes in her maternal uncle; Ovarian cancer in her maternal aunt and mother. Allergies  Allergen Reactions  . Avandia [Rosiglitazone]     Headaches       Outpatient Encounter Prescriptions as of 10/03/2016  Medication Sig  . acetaminophen (TYLENOL) 500 MG tablet Take 1,000 mg by mouth every 6 (six) hours as needed for mild pain.  Marland Kitchen ALPRAZolam (XANAX) 0.5 MG tablet TAKE ONE TABLET BY MOUTH AT BEDTIME AS NEEDED FOR ANXIETY  . aspirin 81 MG tablet Take 1 tablet (  81 mg total) by mouth daily.  Marland Kitchen atorvastatin (LIPITOR) 20 MG tablet Take 1 tablet (20 mg total) by mouth daily.  . budesonide (PULMICORT) 0.5 MG/2ML nebulizer solution Take 2 mLs (0.5 mg total) by nebulization 2 (two) times daily.  . diphenhydrAMINE (BENADRYL) 25 mg capsule Take 50 mg by mouth every 6 (six) hours as needed for allergies.  Marland Kitchen gabapentin (NEURONTIN) 300 MG capsule Take 1 capsule (300 mg total) by mouth at bedtime.  Marland Kitchen ibuprofen (ADVIL,MOTRIN) 200 MG tablet Take 600 mg by mouth every 6 (six) hours as needed for moderate pain.  Marland Kitchen ipratropium-albuterol  (DUONEB) 0.5-2.5 (3) MG/3ML SOLN Take 3 mLs by nebulization every 6 (six) hours.  Marland Kitchen lisinopril (PRINIVIL,ZESTRIL) 5 MG tablet TAKE ONE TABLET BY MOUTH ONCE DAILY  . metFORMIN (GLUCOPHAGE) 1000 MG tablet Take 1 tablet (1,000 mg total) by mouth 2 (two) times daily with a meal.  . niacin (NIASPAN) 500 MG CR tablet Take 1 tablet (500 mg total) by mouth 2 (two) times daily.  Marland Kitchen omeprazole (PRILOSEC) 20 MG capsule Take 1 capsule (20 mg total) by mouth daily.  . Simethicone (GAS-X EXTRA STRENGTH PO) Take 1 tablet by mouth 3 (three) times daily as needed (gas).  . VENTOLIN HFA 108 (90 Base) MCG/ACT inhaler INHALE ONE TO TWO PUFFS BY MOUTH EVERY 4 HOURS AS NEEDED FOR WHEEZING  . [DISCONTINUED] Canagliflozin-Metformin HCl 50-500 MG TABS Take 1 tablet by mouth 2 (two) times daily. (Patient not taking: Reported on 12/30/2015)  . [DISCONTINUED] DULoxetine (CYMBALTA) 60 MG capsule Take 1 capsule (60 mg total) by mouth 2 (two) times daily. (Patient not taking: Reported on 12/30/2015)  . [DISCONTINUED] insulin glargine (LANTUS) 100 UNIT/ML injection Inject 45 units in the evening .   No facility-administered encounter medications on file as of 10/03/2016.      REVIEW OF SYSTEMS  : All other systems reviewed and negative except where noted in the History of Present Illness.   PHYSICAL EXAM: BP 112/66 (BP Location: Left Arm, Patient Position: Sitting, Cuff Size: Normal)   Pulse 100   Ht 5' 2.25" (1.581 m) Comment: height measured without shoes  Wt 155 lb 6 oz (70.5 kg)   BMI 28.19 kg/m  General: Well developed white female in no acute distress; anxious and tearful Head: Normocephalic and atraumatic Eyes:  Sclerae anicteric, conjunctiva pink. Ears: Normal auditory acuity Lungs: Clear throughout to auscultation Heart: Regular rate and rhythm Abdomen: Soft, bloated.  BS present.  Diffuse TTP.  Umbilical/ventral hernia noted. Rectal:  Will be done at the time of colonoscopy. Musculoskeletal: Symmetrical with no  gross deformities  Skin: No lesions on visible extremities Extremities: No edema  Neurological: Alert oriented x 4, grossly non-focal Psychological:  Alert and cooperative. Normal mood and affect  ASSESSMENT AND PLAN: -56 year old female with multiple GI complaints including diffuse abdominal pain, bloating, alternating bowel habits between constipation and diarrhea, nausea, early satiety, and weight loss:  She most certainly may have diabetic gastroparesis and/or diabetic gastrointestinal autonomic neuropathy from her long-standing less than adequately controlled diabetes.  Likely also has a large component of IBS as well since she is very anxious even at her appt today.  ?Metformin contributing to her diarrhea.  I am going to schedule her for both an endoscopy and colonoscopy with Dr. Henrene Pastor. The risks, benefits, and alternatives to EGD and colonoscopy were discussed with the patient and she consents to proceed.  I also think that some of her bloating, abdominal discomfort, and nausea could be related to  her very large ovarian cyst.  She will discuss this further with Dr. Ronita Hipps who is currently just monitoring the lesion.  Pending all results, some of her symptoms may benefit from a course of xifaxan.   CC:  Kalman Shan Ratlif*

## 2016-10-03 NOTE — Progress Notes (Signed)
Initial assessment and plans reviewed 

## 2016-10-03 NOTE — Patient Instructions (Addendum)
You have been scheduled for an endoscopy and colonoscopy. Please follow the written instructions given to you at your visit today. Please pick up your prep supplies at the pharmacy within the next 1-3 days. If you use inhalers (even only as needed), please bring them with you on the day of your procedure.  

## 2016-10-04 ENCOUNTER — Encounter: Payer: Self-pay | Admitting: Internal Medicine

## 2016-10-12 ENCOUNTER — Ambulatory Visit (INDEPENDENT_AMBULATORY_CARE_PROVIDER_SITE_OTHER): Payer: 59 | Admitting: Internal Medicine

## 2016-10-12 VITALS — BP 135/64 | HR 100 | Temp 98.0°F | Resp 22 | Ht 61.5 in | Wt 156.5 lb

## 2016-10-12 DIAGNOSIS — Z7984 Long term (current) use of oral hypoglycemic drugs: Secondary | ICD-10-CM | POA: Diagnosis not present

## 2016-10-12 DIAGNOSIS — J449 Chronic obstructive pulmonary disease, unspecified: Secondary | ICD-10-CM

## 2016-10-12 DIAGNOSIS — F17210 Nicotine dependence, cigarettes, uncomplicated: Secondary | ICD-10-CM

## 2016-10-12 DIAGNOSIS — F419 Anxiety disorder, unspecified: Secondary | ICD-10-CM | POA: Diagnosis not present

## 2016-10-12 DIAGNOSIS — E119 Type 2 diabetes mellitus without complications: Secondary | ICD-10-CM

## 2016-10-12 DIAGNOSIS — Z794 Long term (current) use of insulin: Principal | ICD-10-CM

## 2016-10-12 DIAGNOSIS — Z79899 Other long term (current) drug therapy: Secondary | ICD-10-CM

## 2016-10-12 DIAGNOSIS — I10 Essential (primary) hypertension: Secondary | ICD-10-CM | POA: Diagnosis not present

## 2016-10-12 DIAGNOSIS — F17200 Nicotine dependence, unspecified, uncomplicated: Secondary | ICD-10-CM

## 2016-10-12 LAB — GLUCOSE, CAPILLARY: Glucose-Capillary: 231 mg/dL — ABNORMAL HIGH (ref 65–99)

## 2016-10-12 LAB — POCT GLYCOSYLATED HEMOGLOBIN (HGB A1C): HEMOGLOBIN A1C: 9.7

## 2016-10-12 MED ORDER — INSULIN GLARGINE 100 UNIT/ML ~~LOC~~ SOLN: 20.0000 [IU] | Freq: Every day | SUBCUTANEOUS | 2 refills | Status: DC

## 2016-10-12 MED ORDER — GLUCOSE BLOOD VI STRP: ORAL_STRIP | 12 refills | Status: AC

## 2016-10-12 MED ORDER — ALPRAZOLAM 0.5 MG PO TABS: ORAL_TABLET | ORAL | 0 refills | Status: DC

## 2016-10-12 MED ORDER — FLUTICASONE-SALMETEROL 250-50 MCG/DOSE IN AEPB: 1.0000 | INHALATION_SPRAY | Freq: Two times a day (BID) | RESPIRATORY_TRACT | 5 refills | Status: DC

## 2016-10-12 MED ORDER — TIOTROPIUM BROMIDE MONOHYDRATE 18 MCG IN CAPS: 18.0000 ug | ORAL_CAPSULE | Freq: Once | RESPIRATORY_TRACT | 5 refills | Status: DC

## 2016-10-12 MED ORDER — TRUE METRIX METER DEVI: 1.0000 | Freq: Three times a day (TID) | 0 refills | Status: DC

## 2016-10-12 NOTE — Progress Notes (Signed)
   CC: Follow up on type II diabetes  HPI:  [name] is a 56 y.o. woman with history noted below that presents to the internal medicine clinic for follow up on type II diabetes.  She states she currently takes metformin 1000mg  twice a day.   Patient states she has not taken her insulin for months due to not having insurance.  She states that she now has insurance and she would like to be restarted on her previous regimen. She states she used to be on lantus 45 units at night.  She reports her blood sugars are in the 200s checking 2-3 times a day.  She did not bring her meter today because she shares it with her husband.  She denies changes in vision, increase in urination or increase in thirst.   Patient currently smokes 1-1.5 packs a day and has for 40 years.  Patient was switched to budesonide and duoneb from her inhalers of advair and spiriva due to cost from not having insurance.  She denies any recent COPD exacerbations and does not have any respiratory complaints today.  Past Medical History:  Diagnosis Date  . Asthma    No PFT performed  . Depression   . Diabetes mellitus 2002  . GERD (gastroesophageal reflux disease)   . Helicobacter pylori (H. pylori) infection    s/p triple therapy  . Hepatic hemangioma   . Hyperlipidemia   . Hypertriglyceridemia   . Panic attacks    mostly Agaraphobia    Review of Systems:  Review of Systems  Constitutional: Negative for chills and fever.  HENT: Negative for congestion.   Respiratory: Negative for cough and shortness of breath.   Cardiovascular: Negative for chest pain.  Genitourinary: Negative for frequency.  Neurological: Negative for dizziness.  All other systems reviewed and are negative.    Physical Exam:  Vitals:   10/12/16 1404  BP: 135/64  Pulse: 100  Resp: (!) 22  Temp: 98 F (36.7 C)  TempSrc: Oral  SpO2: 91%  Weight: 156 lb 8 oz (71 kg)  Height: 5' 1.5" (1.562 m)   Physical Exam  Cardiovascular: Normal  rate, regular rhythm and normal heart sounds.  Exam reveals no gallop and no friction rub.   No murmur heard. Pulmonary/Chest: Effort normal and breath sounds normal. No respiratory distress. She has no wheezes. She has no rales.  Musculoskeletal: She exhibits no edema.  Skin: Skin is warm and dry.    Assessment & Plan:   See encounters tab for problem based medical decision making.   Patient discussed with Dr. Beryle Beams

## 2016-10-12 NOTE — Patient Instructions (Addendum)
Ms. Dejoy,  Please go by the internal medicine outpatient pharmacy to pick up a glucose meter. Please check your blood sugars 3 times a day before meals. Please take 20 units of lantus at night.  I would like for you to follow-up in one month in the acute care clinic to see if further adjustments need to be made to your medications.  Please bring your meter at that time. I have prescribed your Advair and Spiriva inhalers.

## 2016-10-14 NOTE — Assessment & Plan Note (Signed)
Assessment: Hypertension Patient is on lisinopril 5 mg daily. Today blood pressure is 135/64, stable and at goal of <140/90  Plan -Continue lisinopril 5mg  daily

## 2016-10-14 NOTE — Assessment & Plan Note (Signed)
Assessment:  Type 2 diabetes mellitus Last hemoglobin A1c was 8.1 on 12/30/2015. Patient is on metformin 1000 mg twice a day. Today hemoglobin A1c is 9.7.  Patient states that she previously could not afford her insulin and has been without it for a while.  She is able to check her cbgs off of her husband's meter.  I recommended going to the [facility] outpatient pharmacy to pick up her own meter and start recording her blood sugars three times a day before meals.  I will restart her on half her insulin regimen at 20 units of lantus at night.  She was advised to return in a month with her meter in order to decide to make changes to her diabetes medications.  Plan - blood glucose meter - lantus 20 units at night  - continue metformin 1000mg  BID

## 2016-10-14 NOTE — Assessment & Plan Note (Signed)
Assessment:  Chronic Obstructive Pulmonary Disease Patient was previously on advair and spiriva but due to insurance issues has not had her inhalers.  She has been prescribed budesonide and duoneb since then and denies any COPD exacerbations or respiratory issues  Plan - prescribed advair and spiriva to be used daily  - told to stop using budesonide and duoneb daily - told to use duoneb if having increased shortness of breath and wheezing in conjunction with her daily inhaler use

## 2016-10-14 NOTE — Assessment & Plan Note (Signed)
Assessment:  Tobacco dependence Patient has a 40 pack year smoking history.  Spent 3 minutes on smoking cessation counseling.  Patient states she is not ready to quit.  I let her know when she was ready we could provide resources to help with the process.  Plan -reassess readiness to quit on next visit

## 2016-10-14 NOTE — Assessment & Plan Note (Signed)
Assessment:  Anxiety Patient states she takes Xanax 0.5mg  at night to help with sleep and anxiety and her symptoms are currently controlled with this dose.  Plan -refill 1 month supply of Xanax 0.5mg  daily

## 2016-10-16 ENCOUNTER — Other Ambulatory Visit: Payer: Self-pay | Admitting: *Deleted

## 2016-10-16 ENCOUNTER — Ambulatory Visit (INDEPENDENT_AMBULATORY_CARE_PROVIDER_SITE_OTHER): Payer: 59 | Admitting: Internal Medicine

## 2016-10-16 ENCOUNTER — Encounter: Payer: Self-pay | Admitting: Internal Medicine

## 2016-10-16 ENCOUNTER — Other Ambulatory Visit: Payer: Self-pay | Admitting: Internal Medicine

## 2016-10-16 VITALS — BP 106/74 | HR 93 | Temp 99.1°F | Resp 17 | Ht 62.0 in | Wt 155.0 lb

## 2016-10-16 DIAGNOSIS — R110 Nausea: Secondary | ICD-10-CM | POA: Diagnosis not present

## 2016-10-16 DIAGNOSIS — K635 Polyp of colon: Secondary | ICD-10-CM

## 2016-10-16 DIAGNOSIS — D125 Benign neoplasm of sigmoid colon: Secondary | ICD-10-CM

## 2016-10-16 DIAGNOSIS — Z1211 Encounter for screening for malignant neoplasm of colon: Secondary | ICD-10-CM

## 2016-10-16 DIAGNOSIS — K5900 Constipation, unspecified: Secondary | ICD-10-CM | POA: Diagnosis not present

## 2016-10-16 DIAGNOSIS — R197 Diarrhea, unspecified: Secondary | ICD-10-CM

## 2016-10-16 DIAGNOSIS — K2990 Gastroduodenitis, unspecified, without bleeding: Secondary | ICD-10-CM

## 2016-10-16 DIAGNOSIS — K2970 Gastritis, unspecified, without bleeding: Secondary | ICD-10-CM | POA: Diagnosis not present

## 2016-10-16 DIAGNOSIS — R112 Nausea with vomiting, unspecified: Secondary | ICD-10-CM

## 2016-10-16 DIAGNOSIS — K219 Gastro-esophageal reflux disease without esophagitis: Secondary | ICD-10-CM | POA: Diagnosis not present

## 2016-10-16 DIAGNOSIS — K5730 Diverticulosis of large intestine without perforation or abscess without bleeding: Secondary | ICD-10-CM | POA: Diagnosis not present

## 2016-10-16 MED ORDER — SODIUM CHLORIDE 0.9 % IV SOLN: 500.0000 mL | INTRAVENOUS | Status: DC

## 2016-10-16 NOTE — Op Note (Signed)
[place] Patient Name: Vanessa [name] Procedure Date: 10/16/2016 1:19 PM MRN: [id number] Endoscopist: Docia Chuck. Henrene Pastor , MD Age: 56 Referring MD:  Date of Birth: 1961/04/17 Gender: Female Account #: [id number] Procedure:                Upper GI endoscopy Indications:              Abdominal pain, Suspected esophageal reflux, Nausea                            with vomiting Medicines:                Monitored Anesthesia Care Procedure:                Pre-Anesthesia Assessment:                           - Prior to the procedure, a History and Physical                            was performed, and patient medications and                            allergies were reviewed. The patient's tolerance of                            previous anesthesia was also reviewed. The risks                            and benefits of the procedure and the sedation                            options and risks were discussed with the patient.                            All questions were answered, and informed consent                            was obtained. Prior Anticoagulants: The patient has                            taken no previous anticoagulant or antiplatelet                            agents. ASA Grade Assessment: III - A patient with                            severe systemic disease. After reviewing the risks                            and benefits, the patient was deemed in                            satisfactory condition to undergo the procedure.  After obtaining informed consent, the endoscope was                            passed under direct vision. Throughout the                            procedure, the patient's blood pressure, pulse, and                            oxygen saturations were monitored continuously. The                            Endoscope was introduced through the mouth, and                            advanced to the second part of duodenum.  The upper                            GI endoscopy was accomplished without difficulty.                            The patient tolerated the procedure well. Scope In: Scope Out: Findings:                 A mild Schatzki ring (acquired) was found at the                            gastroesophageal junction.                           The exam of the esophagus was otherwise normal.                           A hiatal hernia was present.                           Mildly erythematous mucosa was found in the gastric                            antrum. Biopsies were taken with a cold forceps for                            Helicobacter pylori testing using CLOtest.                           The exam of the stomach was otherwise normal.                           Patchy moderately erythematous mucosa without                            active bleeding was found in the duodenal bulb.                           The exam of the  duodenum was otherwise normal. Complications:            No immediate complications. Estimated Blood Loss:     Estimated blood loss: none. Impression:               - Mild Schatzki ring.                           - Hiatal hernia.                           - Erythematous mucosa in the antrum. Biopsied.                           - Erythematous duodenopathy. Recommendation:           1. Reflux precautions                           2. Continue omeprazole 20 mg daily                           3. Follow-up CLO test                           4. Stop smoking                           5. GI follow-up in 6 weeks. Docia Chuck. Henrene Pastor, MD 10/16/2016 2:08:54 PM This report has been signed electronically.

## 2016-10-16 NOTE — Patient Instructions (Signed)
YOU HAD AN ENDOSCOPIC PROCEDURE TODAY AT THE [facility]:   Refer to the procedure report that was given to you for any specific questions about what was found during the examination.  If the procedure report does not answer your questions, please call your gastroenterologist to clarify.  If you requested that your care partner not be given the details of your procedure findings, then the procedure report has been included in a sealed envelope for you to review at your convenience later.  YOU SHOULD EXPECT: Some feelings of bloating in the abdomen. Passage of more gas than usual.  Walking can help get rid of the air that was put into your GI tract during the procedure and reduce the bloating. If you had a lower endoscopy (such as a colonoscopy or flexible sigmoidoscopy) you may notice spotting of blood in your stool or on the toilet paper. If you underwent a bowel prep for your procedure, you may not have a normal bowel movement for a few days.  Please Note:  You might notice some irritation and congestion in your nose or some drainage.  This is from the oxygen used during your procedure.  There is no need for concern and it should clear up in a day or so.  SYMPTOMS TO REPORT IMMEDIATELY:   Following lower endoscopy (colonoscopy or flexible sigmoidoscopy):  Excessive amounts of blood in the stool  Significant tenderness or worsening of abdominal pains  Swelling of the abdomen that is new, acute  Fever of 100F or higher   Following upper endoscopy (EGD)  Vomiting of blood or coffee ground material  New chest pain or pain under the shoulder blades  Painful or persistently difficult swallowing  New shortness of breath  Fever of 100F or higher  Black, tarry-looking stools  For urgent or emergent issues, a gastroenterologist can be reached at any hour by calling [phone].  Please read all handouts given to you by your recovery nurse.  DIET:  We do recommend a small meal at  first, but then you may proceed to your regular diet.  Drink plenty of fluids but you should avoid alcoholic beverages for 24 hours.  ACTIVITY:  You should plan to take it easy for the rest of today and you should NOT DRIVE or use heavy machinery until tomorrow (because of the sedation medicines used during the test).    FOLLOW UP: Our staff will call the number listed on your records the next business day following your procedure to check on you and address any questions or concerns that you may have regarding the information given to you following your procedure. If we do not reach you, we will leave a message.  However, if you are feeling well and you are not experiencing any problems, there is no need to return our call.  We will assume that you have returned to your regular daily activities without incident.  If any biopsies were taken you will be contacted by phone or by letter within the next 1-3 weeks.  Please call us at [phone] if you have not heard about the biopsies in 3 weeks.    SIGNATURES/CONFIDENTIALITY: You and/or your care partner have signed paperwork which will be entered into your electronic medical record.  These signatures attest to the fact that that the information above on your After Visit Summary has been reviewed and is understood.  Full responsibility of the confidentiality of this discharge information lies with you and/or your care-partner. Thank   you for letting us take care of your healthcare needs today. 

## 2016-10-16 NOTE — Progress Notes (Signed)
Report to PACU, RN, vss, BBS= Clear.  

## 2016-10-16 NOTE — Progress Notes (Signed)
Please refer to the blue and neon green sheets for instructions regarding diet and activity for the rest of today.Called to room to assist during endoscopic procedure.  Patient ID and intended procedure confirmed with present staff. Received instructions for my participation in the procedure from the performing physician. 

## 2016-10-16 NOTE — Op Note (Signed)
[facility] Patient Name: Vanessa [name] Procedure Date: 10/16/2016 1:19 PM MRN: [id number] Endoscopist: Docia Chuck. Henrene Pastor , MD Age: 56 Referring MD:  Date of Birth: 01/27/1961 Gender: Female Account #: [id number] Procedure:                Colonoscopy, with cold snare polypectomy X2 and                            random colon biopsies Indications:              Screening for colorectal malignant neoplasm,                            Incidental diarrhea noted Medicines:                Monitored Anesthesia Care Procedure:                Pre-Anesthesia Assessment:                           - Prior to the procedure, a History and Physical                            was performed, and patient medications and                            allergies were reviewed. The patient's tolerance of                            previous anesthesia was also reviewed. The risks                            and benefits of the procedure and the sedation                            options and risks were discussed with the patient.                            All questions were answered, and informed consent                            was obtained. Prior Anticoagulants: The patient has                            taken no previous anticoagulant or antiplatelet                            agents. ASA Grade Assessment: III - A patient with                            severe systemic disease. After reviewing the risks                            and benefits, the patient was deemed in  satisfactory condition to undergo the procedure.                           After obtaining informed consent, the colonoscope                            was passed under direct vision. Throughout the                            procedure, the patient's blood pressure, pulse, and                            oxygen saturations were monitored continuously. The                            Colonoscope was introduced  through the anus and                            advanced to the the cecum, identified by                            appendiceal orifice and ileocecal valve. The                            terminal ileum, ileocecal valve, appendiceal                            orifice, and rectum were photographed. The quality                            of the bowel preparation was excellent. The                            colonoscopy was performed without difficulty. The                            patient tolerated the procedure well. The bowel                            preparation used was SUPREP. Scope In: 1:28:16 PM Scope Out: 1:48:49 PM Scope Withdrawal Time: 0 hours 17 minutes 42 seconds  Total Procedure Duration: 0 hours 20 minutes 33 seconds  Findings:                 The terminal ileum appeared normal.                           Two polyps were found in the sigmoid colon. The                            polyps were 3 to 5 mm in size. These polyps were                            removed with a cold snare. Resection and retrieval  were complete.                           Multiple diverticula were found in the left colon                            and right colon.                           The entire examined colon appeared otherwise normal                            on direct and retroflexion views. Biopsies for                            histology were taken with a cold forceps from the                            entire colon for evaluation of microscopic colitis. Complications:            No immediate complications. Estimated blood loss:                            None. Estimated Blood Loss:     Estimated blood loss: none. Impression:               - The examined portion of the ileum was normal.                           - Two 3 to 5 mm polyps in the sigmoid colon,                            removed with a cold snare. Resected and retrieved.                           -  Diverticulosis in the left colon and in the right                            colon.                           - The entire examined colon is otherwise normal on                            direct and retroflexion views. Random colon                            biopsies taken. Recommendation:           - Repeat colonoscopy in 5-10 years for surveillance.                           - Stop smoking.                           - Resume previous diet.                           -  Continue present medications.                           - Await pathology results.                           - EGD today. Please see report regarding findings                            and recommendations John N. Henrene Pastor, MD 10/16/2016 2:03:21 PM This report has been signed electronically.

## 2016-10-16 NOTE — Progress Notes (Signed)
Medicine attending: Medical history, presenting problems, physical findings, and medications, reviewed with resident physician Dr Jessica Hoffman on the day of the patient visit and I concur with her evaluation and management plan. 

## 2016-10-16 NOTE — Telephone Encounter (Signed)
Yes.  Once daily using hanihaler device

## 2016-10-16 NOTE — Telephone Encounter (Signed)
Clarified dose with pharmacy.

## 2016-10-17 ENCOUNTER — Telehealth: Payer: Self-pay | Admitting: *Deleted

## 2016-10-17 LAB — HELICOBACTER PYLORI SCREEN-BIOPSY: UREASE: NEGATIVE

## 2016-10-17 NOTE — Telephone Encounter (Signed)
  Follow up Call-  Call back number 10/16/2016  Post procedure Call Back phone  # [phone]  Permission to leave phone message Yes  Some recent data might be hidden     Patient questions:  Do you have a fever, pain , or abdominal swelling? No. Pain Score  0 *  Have you tolerated food without any problems? Yes.    Have you been able to return to your normal activities? Yes.    Do you have any questions about your discharge instructions: Diet   No. Medications  No. Follow up visit  No.  Do you have questions or concerns about your Care? No.  Actions: * If pain score is 4 or above: No action needed, pain <4.

## 2016-10-19 ENCOUNTER — Other Ambulatory Visit: Payer: Self-pay | Admitting: *Deleted

## 2016-10-19 MED ORDER — ATORVASTATIN CALCIUM 20 MG PO TABS: 20.0000 mg | ORAL_TABLET | Freq: Every day | ORAL | 2 refills | Status: DC

## 2016-10-20 ENCOUNTER — Other Ambulatory Visit: Payer: Self-pay

## 2016-10-20 ENCOUNTER — Other Ambulatory Visit: Payer: Self-pay | Admitting: *Deleted

## 2016-10-20 NOTE — Telephone Encounter (Signed)
Requesting to speak with a nurse about meds. Please call back.  

## 2016-10-20 NOTE — Telephone Encounter (Signed)
Pt calls and states she had ask for lantus pens and vials were sent in, pt states her copay for lantus is 288.00 a month and she cannot afford this, could you look into this, will send to [facility] dr kim and dr Heber [place]

## 2016-10-20 NOTE — Telephone Encounter (Signed)
I defer this call to Dr. Maudie Mercury who should be back Monday, she may be in the [place]. We probably have samples the attending doctor can sign out to her if she needs them.

## 2016-10-20 NOTE — Telephone Encounter (Signed)
Wrong encounter. 

## 2016-10-24 ENCOUNTER — Encounter: Payer: Self-pay | Admitting: Internal Medicine

## 2016-10-25 NOTE — Telephone Encounter (Signed)
Hi Dr. Heber [place], Patient states her husband takes Vania Rea which is covered under the same insurance plan for $0. I was surprised to hear but was wondering if ok to try Jardiance for her. Please let me know. Thank you

## 2016-11-01 ENCOUNTER — Other Ambulatory Visit: Payer: Self-pay | Admitting: Internal Medicine

## 2016-11-01 MED ORDER — EMPAGLIFLOZIN 10 MG PO TABS: 10.0000 mg | ORAL_TABLET | Freq: Every day | ORAL | 2 refills | Status: DC

## 2016-11-01 NOTE — Telephone Encounter (Signed)
Dr. Maudie Mercury,  We can try Jardiance 10mg  daily

## 2016-11-01 NOTE — Telephone Encounter (Signed)
I sent it to her pharmacy

## 2016-11-02 ENCOUNTER — Telehealth: Payer: Self-pay | Admitting: Pharmacist

## 2016-11-02 NOTE — Telephone Encounter (Signed)
Ok no problem, I will call her. Thank you!

## 2016-11-02 NOTE — Progress Notes (Signed)
Jardiance (empagliflozin) was reviewed with the patient, including name, instructions, indication, goals of therapy, potential side effects, importance of adherence, and safe use.  Patient verbalized understanding by repeating back information and was advised to contact me if further medication-related questions arise. Patient was also provided an information handout.

## 2016-11-08 DIAGNOSIS — Z01419 Encounter for gynecological examination (general) (routine) without abnormal findings: Secondary | ICD-10-CM | POA: Diagnosis not present

## 2016-11-23 ENCOUNTER — Encounter: Payer: Self-pay | Admitting: Internal Medicine

## 2016-11-27 ENCOUNTER — Ambulatory Visit: Payer: Self-pay | Admitting: Internal Medicine

## 2016-12-06 ENCOUNTER — Other Ambulatory Visit: Payer: Self-pay | Admitting: Internal Medicine

## 2016-12-06 ENCOUNTER — Telehealth: Payer: Self-pay

## 2016-12-06 ENCOUNTER — Other Ambulatory Visit: Payer: Self-pay | Admitting: Oncology

## 2016-12-06 DIAGNOSIS — E119 Type 2 diabetes mellitus without complications: Secondary | ICD-10-CM

## 2016-12-06 DIAGNOSIS — Z794 Long term (current) use of insulin: Principal | ICD-10-CM

## 2016-12-06 NOTE — Telephone Encounter (Signed)
Tried insulin last visit and patient said she couldn't afford it and wanted Jardiance since it was covered by insurance Is she able to afford insulin now?  She should have a prescription available from last visit.  If not I can re-send.

## 2016-12-06 NOTE — Telephone Encounter (Signed)
Called xanax to pharmacy

## 2016-12-06 NOTE — Telephone Encounter (Signed)
Pt calls and states that she believes jardiance caused the worst vaginal yeast she has ever had, she desires to switch back to insulin, she stopped jardiance appr 1 week ago, she used monistat 3 for over 1 week, please call her. Sending to dr's kim and hoffman

## 2016-12-06 NOTE — Telephone Encounter (Signed)
Requesting to speak with a nruse about meds. Please call pt back.

## 2016-12-21 ENCOUNTER — Telehealth: Payer: Self-pay | Admitting: Pharmacist

## 2016-12-21 DIAGNOSIS — Z794 Long term (current) use of insulin: Principal | ICD-10-CM

## 2016-12-21 DIAGNOSIS — E119 Type 2 diabetes mellitus without complications: Secondary | ICD-10-CM

## 2016-12-21 MED ORDER — LIRAGLUTIDE 18 MG/3ML ~~LOC~~ SOPN: 1.2000 mg | PEN_INJECTOR | Freq: Every day | SUBCUTANEOUS | 11 refills | Status: DC

## 2016-12-21 NOTE — Telephone Encounter (Signed)
Patient called for help affording insulin, appointment made for tomorrow.

## 2016-12-22 ENCOUNTER — Ambulatory Visit: Payer: Self-pay | Admitting: Pharmacist

## 2016-12-28 ENCOUNTER — Telehealth: Payer: Self-pay | Admitting: Internal Medicine

## 2016-12-28 NOTE — Telephone Encounter (Signed)
Called pharmacy, last script on file, they will get ready and notify pt

## 2016-12-28 NOTE — Telephone Encounter (Signed)
NEEDS REFILL METFORMIN [place], [place] [phone]

## 2017-01-11 ENCOUNTER — Encounter: Payer: Self-pay | Admitting: Internal Medicine

## 2017-01-11 NOTE — Progress Notes (Deleted)
   CC: ***  HPI:  Ms.Madysyn L Debellis is a 56 y.o.   Past Medical History:  Diagnosis Date  . Asthma    No PFT performed  . COPD (chronic obstructive pulmonary disease) ([place])   . Depression   . Diabetes mellitus 2002  . GERD (gastroesophageal reflux disease)   . Helicobacter pylori (H. pylori) infection    s/p triple therapy  . Hepatic hemangioma   . Hyperlipidemia   . Hypertriglyceridemia   . Panic attacks    mostly Agaraphobia    Review of Systems:  ***  Physical Exam:  There were no vitals filed for this visit. ***  Assessment & Plan:   See encounters tab for problem based medical decision making.  Assessment:  Tobacco dependence Patient has a 40 pack year smoking history.  Spent 3 minutes on smoking cessation counseling.  Patient states she is ***.  I let her know when she was ready we could provide resources to help with the process.  Plan -reassess readiness to quit on next visit  Assessment:  Chronic Obstructive Pulmonary Disease Patient was prescribed budesonide and duoneb on last clinic visit.  Since then she ***denies any COPD exacerbations or respiratory issues  Plan - prescribed advair and spiriva to be used daily  - told to stop using budesonide and duoneb daily - told to use duoneb if having increased shortness of breath and wheezing in conjunction with her daily inhaler use  Assessment: Hypertension Patient is on lisinopril 5 mg daily. Today blood pressure is ***, stable and at goal of <140/90  Plan -Continue lisinopril 5mg  daily  Assessment:  Type 2 diabetes mellitus Last hemoglobin A1c was 9.7 on 09/2016. Patient is on ***metformin 1000 mg twice a day. Today hemoglobin A1c is ***.  Meter ***.    Plan - lantus ***  - continue metformin 1000mg  BID  Assessment:  Anxiety Patient states she takes Xanax 0.5mg  at night to help with sleep and anxiety and her symptoms are currently controlled with this dose.  Plan -refill 1 month supply of  Xanax 0.5mg  daily   Patient {GC/GE:3044014::"discussed with","seen with"} Dr. {NAMES:[id number]::"Butcher","Granfortuna","E. Navarro Nine","Klima","Mullen","Narendra","Vincent"}

## 2017-01-30 ENCOUNTER — Encounter: Payer: Self-pay | Admitting: Internal Medicine

## 2017-01-31 NOTE — Progress Notes (Deleted)
   CC: ***  HPI:  Ms.Tanikka L Garmon is a 56 y.o.   Past Medical History:  Diagnosis Date  . Asthma    No PFT performed  . COPD (chronic obstructive pulmonary disease) ([place])   . Depression   . Diabetes mellitus 2002  . GERD (gastroesophageal reflux disease)   . Helicobacter pylori (H. pylori) infection    s/p triple therapy  . Hepatic hemangioma   . Hyperlipidemia   . Hypertriglyceridemia   . Panic attacks    mostly Agaraphobia    Review of Systems:  ***  Physical Exam:  There were no vitals filed for this visit. ***  Assessment & Plan:   See encounters tab for problem based medical decision making.  Assessment:  Type 2 diabetes mellitus Last hemoglobin A1c was 9.7 on 10/12/2016. Patient is on metformin 1000 mg twice a day. Today hemoglobin A1c is ***.  On last visit she was prescribed a glucose meter, lantus 20 units at night and told to follow up in one month to adjust her insulin dosage based on meter readings.  Continue metformin 1000g BID.   Patient {GC/GE:3044014::"discussed with","seen with"} Dr. {NAMES:[id number]::"Butcher","Granfortuna","E. Camille Thau","Klima","Mullen","Narendra","Vincent"}

## 2017-02-01 ENCOUNTER — Encounter: Payer: Self-pay | Admitting: Internal Medicine

## 2017-02-12 ENCOUNTER — Other Ambulatory Visit: Payer: Self-pay | Admitting: Internal Medicine

## 2017-02-12 DIAGNOSIS — Z794 Long term (current) use of insulin: Principal | ICD-10-CM

## 2017-02-12 DIAGNOSIS — E119 Type 2 diabetes mellitus without complications: Secondary | ICD-10-CM

## 2017-03-01 ENCOUNTER — Encounter: Payer: Self-pay | Admitting: Internal Medicine

## 2017-03-08 ENCOUNTER — Encounter: Payer: Self-pay | Admitting: Internal Medicine

## 2017-03-08 NOTE — Progress Notes (Deleted)
   CC: ***  HPI:  [name] is a 56 y.o.   Past Medical History:  Diagnosis Date  . Asthma    No PFT performed  . COPD (chronic obstructive pulmonary disease) ([place])   . Depression   . Diabetes mellitus 2002  . GERD (gastroesophageal reflux disease)   . Helicobacter pylori (H. pylori) infection    s/p triple therapy  . Hepatic hemangioma   . Hyperlipidemia   . Hypertriglyceridemia   . Panic attacks    mostly Agaraphobia    Review of Systems:  ***  Physical Exam:  There were no vitals filed for this visit. ***  Assessment & Plan:   See encounters tab for problem based medical decision making.  Assessment:  Tobacco dependence Patient has a 40 pack year smoking history.  Spent 3 minutes on smoking cessation counseling.  *** Plan -reassess readiness to quit on next visit  Assessment: Hypertension Patient is on lisinopril 5 mg daily. Today blood pressure is ***, stable and at goal of <140/90  Plan -Continue lisinopril 5mg  daily  Assessment:  Type 2 diabetes mellitus ***  Patient {GC/GE:3044014::"discussed with","seen with"} Dr. {NAMES:[id number]::"Butcher","Granfortuna","E. Katera Rybka","Klima","Mullen","Narendra","Vincent"}

## 2017-03-29 ENCOUNTER — Encounter: Payer: Self-pay | Admitting: Internal Medicine

## 2017-03-29 ENCOUNTER — Encounter (INDEPENDENT_AMBULATORY_CARE_PROVIDER_SITE_OTHER): Payer: Self-pay

## 2017-03-29 ENCOUNTER — Ambulatory Visit (INDEPENDENT_AMBULATORY_CARE_PROVIDER_SITE_OTHER): Payer: 59 | Admitting: Internal Medicine

## 2017-03-29 VITALS — BP 132/64 | HR 104 | Temp 98.2°F | Ht 61.0 in | Wt 153.7 lb

## 2017-03-29 DIAGNOSIS — F329 Major depressive disorder, single episode, unspecified: Secondary | ICD-10-CM | POA: Diagnosis not present

## 2017-03-29 DIAGNOSIS — Z23 Encounter for immunization: Secondary | ICD-10-CM

## 2017-03-29 DIAGNOSIS — Z0000 Encounter for general adult medical examination without abnormal findings: Secondary | ICD-10-CM | POA: Diagnosis not present

## 2017-03-29 DIAGNOSIS — Z794 Long term (current) use of insulin: Secondary | ICD-10-CM | POA: Diagnosis not present

## 2017-03-29 DIAGNOSIS — F32A Depression, unspecified: Secondary | ICD-10-CM

## 2017-03-29 DIAGNOSIS — E119 Type 2 diabetes mellitus without complications: Secondary | ICD-10-CM | POA: Diagnosis not present

## 2017-03-29 DIAGNOSIS — F419 Anxiety disorder, unspecified: Secondary | ICD-10-CM | POA: Diagnosis not present

## 2017-03-29 LAB — GLUCOSE, CAPILLARY: GLUCOSE-CAPILLARY: 169 mg/dL — AB (ref 65–99)

## 2017-03-29 LAB — POCT GLYCOSYLATED HEMOGLOBIN (HGB A1C): HEMOGLOBIN A1C: 9.4

## 2017-03-29 MED ORDER — ALPRAZOLAM 0.5 MG PO TABS: ORAL_TABLET | ORAL | 0 refills | Status: DC

## 2017-03-29 MED ORDER — DULOXETINE HCL 20 MG PO CPEP: 20.0000 mg | ORAL_CAPSULE | Freq: Every day | ORAL | 2 refills | Status: DC

## 2017-03-29 MED ORDER — INSULIN GLARGINE 100 UNITS/ML SOLOSTAR PEN: 10.0000 [IU] | PEN_INJECTOR | Freq: Every day | SUBCUTANEOUS | 2 refills | Status: DC

## 2017-03-29 MED ORDER — METFORMIN HCL 1000 MG PO TABS: 1000.0000 mg | ORAL_TABLET | Freq: Two times a day (BID) | ORAL | 0 refills | Status: DC

## 2017-03-29 MED ORDER — METFORMIN HCL 1000 MG PO TABS: 1000.0000 mg | ORAL_TABLET | Freq: Two times a day (BID) | ORAL | 4 refills | Status: DC

## 2017-03-29 NOTE — Patient Instructions (Signed)
Ms. Dolezal,  It was a pleasure seeing you today. Please follow up in one month.

## 2017-03-29 NOTE — Progress Notes (Signed)
   CC: follow up on type II diabetes  HPI:  Vanessa [name] is a 56 y.o.female with history noted below that presents to the Internal medicine clinic for follow up on type II diabetes.  Please see problem based charting for the status of patient's chronic medical conditions.     Past Medical History:  Diagnosis Date  . Asthma    No PFT performed  . COPD (chronic obstructive pulmonary disease) ([place])   . Depression   . Diabetes mellitus 2002  . GERD (gastroesophageal reflux disease)   . Helicobacter pylori (H. pylori) infection    s/p triple therapy  . Hepatic hemangioma   . Hyperlipidemia   . Hypertriglyceridemia   . Panic attacks    mostly Agaraphobia    Review of Systems:  Review of Systems  Respiratory: Negative for shortness of breath.   Cardiovascular: Negative for chest pain.  Psychiatric/Behavioral: Positive for depression.     Physical Exam:  Vitals:   03/29/17 1529  BP: 132/64  Pulse: (!) 104  Temp: 98.2 F (36.8 C)  TempSrc: Oral  SpO2: 92%  Weight: 153 lb 11.2 oz (69.7 kg)  Height: 5\' 1"  (1.549 m)   Physical Exam  Constitutional: She is well-developed, well-nourished, and in no distress.  Cardiovascular: Normal rate, regular rhythm and normal heart sounds.  Exam reveals no gallop and no friction rub.   No murmur heard. Pulmonary/Chest: Effort normal and breath sounds normal. No respiratory distress. She has no wheezes. She has no rales.  Abdominal: Soft. She exhibits no distension. There is no tenderness.  Musculoskeletal: She exhibits no edema.  Skin: Skin is warm and dry.    Assessment & Plan:   See encounters tab for problem based medical decision making.   Patient discussed with Dr. Dareen Piano

## 2017-04-02 ENCOUNTER — Telehealth: Payer: Self-pay

## 2017-04-02 NOTE — Telephone Encounter (Signed)
levemir pens and basagar pens  insurance needs to replace lantus   venlaflexine and cipalopran insurance to replace cymbalta  Pt calls and states she spoke w/ insurance rep today

## 2017-04-02 NOTE — Telephone Encounter (Signed)
Requesting to speak with a nurse about meds. Please call pt back.  

## 2017-04-03 NOTE — Assessment & Plan Note (Signed)
Assessment:  Anxiety and depression  Patient reports increase in depression symptoms including lack of interest and difficulty concentrating.  She is also having trouble sleeping.  She was tearful on exam.  She states she has taken cymbalta in the past with benefit.  Will restart cymbalta.at 20mg  and reassess in one month.  Plan -refill chronic xanax 0.5mg  once daily -start cymbalta 20mg  daily -PHQ-9 on next visit

## 2017-04-03 NOTE — Assessment & Plan Note (Signed)
Assessment:  Health care maintenance Flu shot due  Plan -flu vaccine

## 2017-04-03 NOTE — Assessment & Plan Note (Signed)
Assessment: Type II diabetes Patient states she hasn't taken her insulin for six months and has run out of her metformin 1000mg  BID one week ago.  She reports her blood glucose is 200s in the mornings.  Her hemoglobin A1C was 9.4 today and previously 9.7.  Will restart lantus at 10units at night and refill metformin 1000mg  BID and her follow up on one month with meter  Plan -lantus 10 units at night -metformin 1000mg  BID -follow up in one month

## 2017-04-04 NOTE — Progress Notes (Signed)
Internal Medicine Clinic Attending  Case discussed with Dr. Hoffman at the time of the visit.  We reviewed the resident's history and exam and pertinent patient test results.  I agree with the assessment, diagnosis, and plan of care documented in the resident's note.  

## 2017-04-06 ENCOUNTER — Other Ambulatory Visit: Payer: Self-pay | Admitting: Internal Medicine

## 2017-04-06 ENCOUNTER — Telehealth: Payer: Self-pay

## 2017-04-06 MED ORDER — INSULIN DETEMIR 100 UNIT/ML FLEXPEN: 10.0000 [IU] | PEN_INJECTOR | Freq: Every day | SUBCUTANEOUS | 11 refills | Status: DC

## 2017-04-06 MED ORDER — CITALOPRAM HYDROBROMIDE 20 MG PO TABS: 20.0000 mg | ORAL_TABLET | Freq: Every day | ORAL | 1 refills | Status: DC

## 2017-04-06 NOTE — Telephone Encounter (Signed)
Pt is calling back again, would like Bonnita Nasuti to call back.

## 2017-04-06 NOTE — Telephone Encounter (Signed)
Called pt back, she was upset that meds were just done, reassured her and ask her to pick them up at [facility]

## 2017-04-06 NOTE — Telephone Encounter (Signed)
Please review and answer

## 2017-04-06 NOTE — Telephone Encounter (Signed)
Pt is still waiting for a nurse to call back about meds. Please call back.

## 2017-04-06 NOTE — Telephone Encounter (Signed)
Ok I put in an order for levemir and citalopram

## 2017-04-09 NOTE — Telephone Encounter (Signed)
Informed pt 10/12

## 2017-06-15 ENCOUNTER — Telehealth: Payer: Self-pay | Admitting: *Deleted

## 2017-06-15 NOTE — Telephone Encounter (Signed)
Received fax from [facility] for refills on test supplies. [facility] is only pharmacy on her chart. Called pt to be sure she's using [facility] - no answer; left message.

## 2017-06-15 NOTE — Telephone Encounter (Signed)
Pt states she has heard of [facility] and she buys her own testing supplies. Fax shredded.

## 2017-06-29 ENCOUNTER — Other Ambulatory Visit: Payer: Self-pay | Admitting: *Deleted

## 2017-06-29 DIAGNOSIS — J449 Chronic obstructive pulmonary disease, unspecified: Secondary | ICD-10-CM

## 2017-06-29 MED ORDER — IPRATROPIUM-ALBUTEROL 0.5-2.5 (3) MG/3ML IN SOLN: 3.0000 mL | Freq: Four times a day (QID) | RESPIRATORY_TRACT | 3 refills | Status: DC

## 2017-06-29 MED ORDER — BUDESONIDE 0.5 MG/2ML IN SUSP: 0.5000 mg | Freq: Two times a day (BID) | RESPIRATORY_TRACT | 3 refills | Status: DC

## 2017-08-06 NOTE — Progress Notes (Signed)
   CC: Follow-up on type 2 diabetes  HPI:  [name] is a 57 y.o. female with history noted below the presents to the internal medicine clinic for follow-up on type 2 diabetes. Please see problem based charting for the status of patient's chronic medical conditions.  Past Medical History:  Diagnosis Date  . Asthma    No PFT performed  . COPD (chronic obstructive pulmonary disease) ([place])   . Depression   . Diabetes mellitus 2002  . GERD (gastroesophageal reflux disease)   . Helicobacter pylori (H. pylori) infection    s/p triple therapy  . Hepatic hemangioma   . Hyperlipidemia   . Hypertriglyceridemia   . Panic attacks    mostly Agaraphobia    Review of Systems:  Review of Systems  Respiratory: Negative for shortness of breath.   Cardiovascular: Negative for chest pain.     Physical Exam:  Vitals:   08/09/17 1523 08/09/17 1546  BP: (!) 148/66 (!) 139/57  Pulse: 80 78  Temp: 98.1 F (36.7 C)   TempSrc: Oral   SpO2: 92%   Weight: 153 lb 4.8 oz (69.5 kg)   Height: 5\' 1"  (1.549 m)    Physical Exam  Constitutional: She is well-developed, well-nourished, and in no distress.  Cardiovascular: Normal rate, regular rhythm and normal heart sounds. Exam reveals no gallop and no friction rub.  No murmur heard. Pulmonary/Chest: Effort normal and breath sounds normal. No respiratory distress. She has no wheezes. She has no rales.  Abdominal: Soft. Bowel sounds are normal. She exhibits no distension and no mass. There is no tenderness. There is no rebound and no guarding.  Umbilical hernia    Assessment & Plan:   See encounters tab for problem based medical decision making.   Patient discussed with Dr. Evette Doffing

## 2017-08-09 ENCOUNTER — Encounter (INDEPENDENT_AMBULATORY_CARE_PROVIDER_SITE_OTHER): Payer: Self-pay

## 2017-08-09 ENCOUNTER — Ambulatory Visit: Payer: 59 | Admitting: Internal Medicine

## 2017-08-09 ENCOUNTER — Encounter: Payer: Self-pay | Admitting: Internal Medicine

## 2017-08-09 VITALS — BP 139/57 | HR 78 | Temp 98.1°F | Ht 61.0 in | Wt 153.3 lb

## 2017-08-09 DIAGNOSIS — E119 Type 2 diabetes mellitus without complications: Secondary | ICD-10-CM

## 2017-08-09 DIAGNOSIS — F419 Anxiety disorder, unspecified: Secondary | ICD-10-CM | POA: Diagnosis not present

## 2017-08-09 DIAGNOSIS — Z79899 Other long term (current) drug therapy: Secondary | ICD-10-CM | POA: Diagnosis not present

## 2017-08-09 DIAGNOSIS — Z794 Long term (current) use of insulin: Secondary | ICD-10-CM

## 2017-08-09 DIAGNOSIS — F339 Major depressive disorder, recurrent, unspecified: Secondary | ICD-10-CM | POA: Diagnosis not present

## 2017-08-09 DIAGNOSIS — K429 Umbilical hernia without obstruction or gangrene: Secondary | ICD-10-CM

## 2017-08-09 DIAGNOSIS — I10 Essential (primary) hypertension: Secondary | ICD-10-CM

## 2017-08-09 DIAGNOSIS — F329 Major depressive disorder, single episode, unspecified: Secondary | ICD-10-CM

## 2017-08-09 DIAGNOSIS — Z0000 Encounter for general adult medical examination without abnormal findings: Secondary | ICD-10-CM

## 2017-08-09 LAB — POCT GLYCOSYLATED HEMOGLOBIN (HGB A1C): HEMOGLOBIN A1C: 8.4

## 2017-08-09 LAB — GLUCOSE, CAPILLARY: GLUCOSE-CAPILLARY: 136 mg/dL — AB (ref 65–99)

## 2017-08-09 MED ORDER — ALPRAZOLAM 0.5 MG PO TABS: ORAL_TABLET | ORAL | 0 refills | Status: DC

## 2017-08-09 NOTE — Patient Instructions (Addendum)
Vanessa [name],  It was a pleasure seeing you today.  Please follow up in 3 months.  Please increase levemir to 10 units at night.   A referral has been placed to surgery for your hernia.

## 2017-08-10 NOTE — Assessment & Plan Note (Signed)
Assessment:  Anxiety and depression  Patient is currently prescribed celexa 20mg  daily and takes xanax as needed for anxiety.  Patient's PHQ-9 was 18 today.  Patient states that today is the anniversary of her father and uncles passing. She states that she thinks that is influencing her PHQ-9 score. Will reassess on next visit. Patient takes Xanax 0.5 mg once daily as needed. 30 pills last her 3 months.  Plan -refill chronic xanax 0.5mg   #30 -PHQ-9 on next visit

## 2017-08-10 NOTE — Assessment & Plan Note (Signed)
Assessment: Type II diabetes Patient was started on levemir 10 units at night, metformin 1000mg  BID at previous clinic visit 03/2017.  Currently takes levemir 5 units.  Hemoglobin A1C today is 8.4 previously 9.4 and prior to that 9.7.  Will have patient take levemir 10 units at night.   Plan - Hgb A1C -levemir 10 units at night -metformin 1000mg  BID -foot exam - patient declined

## 2017-08-10 NOTE — Assessment & Plan Note (Signed)
Assessment:  Umbilical hernia Patient states she's had a hernia for several years but has recently gotten worse.  On exam umbilical hernia was noted.  She denies pain.  Plan -referral to general surgery

## 2017-08-10 NOTE — Assessment & Plan Note (Signed)
Assessment: Hypertension Patient is on lisinopril 5 mg daily. Today blood pressure is  139/57, stable and at goal of <140/90  Plan -Continue lisinopril 5mg  daily

## 2017-08-10 NOTE — Progress Notes (Signed)
Internal Medicine Clinic Attending  Case discussed with Dr. Hoffman at the time of the visit.  We reviewed the resident's history and exam and pertinent patient test results.  I agree with the assessment, diagnosis, and plan of care documented in the resident's note.  

## 2017-08-10 NOTE — Assessment & Plan Note (Signed)
Assessment:  Healthcare maintenance Patient is due for mammogram.  Offered referral for mammogram and patient states she will call and have it done.  Plan -offered referral for mammogram

## 2017-08-28 DIAGNOSIS — M6208 Separation of muscle (nontraumatic), other site: Secondary | ICD-10-CM | POA: Diagnosis not present

## 2017-08-28 DIAGNOSIS — K429 Umbilical hernia without obstruction or gangrene: Secondary | ICD-10-CM | POA: Diagnosis not present

## 2017-08-28 DIAGNOSIS — Z720 Tobacco use: Secondary | ICD-10-CM | POA: Diagnosis not present

## 2017-09-04 ENCOUNTER — Other Ambulatory Visit: Payer: Self-pay | Admitting: Internal Medicine

## 2017-09-04 ENCOUNTER — Other Ambulatory Visit: Payer: Self-pay

## 2017-09-04 MED ORDER — FLUTICASONE-SALMETEROL 250-50 MCG/DOSE IN AEPB: 1.0000 | INHALATION_SPRAY | Freq: Two times a day (BID) | RESPIRATORY_TRACT | 5 refills | Status: DC

## 2017-09-04 MED ORDER — ALBUTEROL SULFATE HFA 108 (90 BASE) MCG/ACT IN AERS: INHALATION_SPRAY | RESPIRATORY_TRACT | 11 refills | Status: DC

## 2017-09-04 NOTE — Telephone Encounter (Signed)
VENTOLIN HFA 108 (90 Base) MCG/ACT inhaler and Advair to be filled @ [facility] on [address].

## 2017-09-04 NOTE — Telephone Encounter (Signed)
Thanks.  Both were sent in

## 2017-10-09 ENCOUNTER — Other Ambulatory Visit: Payer: Self-pay | Admitting: *Deleted

## 2017-10-10 MED ORDER — CITALOPRAM HYDROBROMIDE 20 MG PO TABS: 20.0000 mg | ORAL_TABLET | Freq: Every day | ORAL | 2 refills | Status: DC

## 2017-11-29 ENCOUNTER — Other Ambulatory Visit: Payer: Self-pay | Admitting: Internal Medicine

## 2017-12-05 ENCOUNTER — Ambulatory Visit: Payer: Self-pay

## 2017-12-05 ENCOUNTER — Encounter: Payer: Self-pay | Admitting: Internal Medicine

## 2018-01-29 ENCOUNTER — Encounter: Payer: Self-pay | Admitting: Internal Medicine

## 2018-02-04 NOTE — Progress Notes (Deleted)
   CC: ***  HPI:  Ms.Radiah L Shippee is a 57 y.o.   Past Medical History:  Diagnosis Date  . Asthma    No PFT performed  . COPD (chronic obstructive pulmonary disease) ([place])   . Depression   . Diabetes mellitus 2002  . GERD (gastroesophageal reflux disease)   . Helicobacter pylori (H. pylori) infection    s/p triple therapy  . Hepatic hemangioma   . Hyperlipidemia   . Hypertriglyceridemia   . Panic attacks    mostly Agaraphobia    Review of Systems:  ***  Physical Exam:  There were no vitals filed for this visit. ***  Assessment & Plan:   See encounters tab for problem based medical decision making.  Assessment: Hypertension Patient is on lisinopril 5 mg daily. Today blood pressure is  ***, stable and at goal of <140/90  Plan -Continue lisinopril 5mg  daily  Assessment: Type II diabetes Patient currently takes levemir 10 units at night, metformin 1000mg  BID.  Hemoglobin A1C is ***  Patient {GC/GE:3044014::"discussed with","seen with"} Dr. {NAMES:[id number]::"Butcher","Granfortuna","E. Hager Compston","Klima","Mullen","Narendra","Vincent"}

## 2018-02-05 ENCOUNTER — Encounter: Payer: Self-pay | Admitting: Internal Medicine

## 2018-02-14 ENCOUNTER — Encounter: Payer: Self-pay | Admitting: Internal Medicine

## 2018-02-14 NOTE — Progress Notes (Deleted)
   CC: ***  HPI:  [name] is a 57 y.o.   Past Medical History:  Diagnosis Date  . Asthma    No PFT performed  . COPD (chronic obstructive pulmonary disease) ([place])   . Depression   . Diabetes mellitus 2002  . GERD (gastroesophageal reflux disease)   . Helicobacter pylori (H. pylori) infection    s/p triple therapy  . Hepatic hemangioma   . Hyperlipidemia   . Hypertriglyceridemia   . Panic attacks    mostly Agaraphobia    Review of Systems:  ***  Physical Exam:  There were no vitals filed for this visit. ***  Assessment & Plan:   See encounters tab for problem based medical decision making.  Assessment: Hypertension Patient is on lisinopril 5 mg daily. Today blood pressure is  ***, stable and at goal of <140/90  Plan -Continue lisinopril 5mg  daily  Assessment: Type II diabetes Patient takes levemir 10 units at night, metformin 1000mg  BID.  Hemoglobin A1C today is   Plan - Hgb A1C -levemir 10 units at night -metformin 1000mg  BID  Assessment: Anxiety and depression  Patient is currently prescribed celexa 20mg  daily and takes xanax as needed for anxiety.  Patient's PHQ-9 was *** today.  Patient takes Xanax 0.5 mg once daily as needed. 30 pills last her 3 months.  Plan -refill chronic xanax 0.5mg   #30 -PHQ-9 on next visit  Assessment:  Health care maintenance Flu shot offered in office and patient ***  Plan -flu vaccine   Patient {GC/GE:3044014::"discussed with","seen with"} Dr. {NAMES:[id number]::"Butcher","Granfortuna","E. Milena Liggett","Klima","Mullen","Narendra","Vincent"}

## 2018-03-09 DIAGNOSIS — R0789 Other chest pain: Secondary | ICD-10-CM | POA: Diagnosis not present

## 2018-03-09 DIAGNOSIS — R0902 Hypoxemia: Secondary | ICD-10-CM | POA: Diagnosis not present

## 2018-03-09 DIAGNOSIS — R079 Chest pain, unspecified: Secondary | ICD-10-CM | POA: Diagnosis not present

## 2018-03-10 ENCOUNTER — Emergency Department (HOSPITAL_COMMUNITY): Payer: 59

## 2018-03-10 ENCOUNTER — Encounter (HOSPITAL_COMMUNITY): Payer: Self-pay | Admitting: Emergency Medicine

## 2018-03-10 ENCOUNTER — Observation Stay (HOSPITAL_COMMUNITY)
Admission: EM | Admit: 2018-03-10 | Discharge: 2018-03-10 | Discharge status: Against Medical Advice | Payer: 59 | Attending: Internal Medicine | Admitting: Internal Medicine

## 2018-03-10 DIAGNOSIS — F329 Major depressive disorder, single episode, unspecified: Secondary | ICD-10-CM | POA: Insufficient documentation

## 2018-03-10 DIAGNOSIS — I200 Unstable angina: Principal | ICD-10-CM | POA: Insufficient documentation

## 2018-03-10 DIAGNOSIS — F17210 Nicotine dependence, cigarettes, uncomplicated: Secondary | ICD-10-CM | POA: Insufficient documentation

## 2018-03-10 DIAGNOSIS — I10 Essential (primary) hypertension: Secondary | ICD-10-CM | POA: Insufficient documentation

## 2018-03-10 DIAGNOSIS — J449 Chronic obstructive pulmonary disease, unspecified: Secondary | ICD-10-CM | POA: Diagnosis not present

## 2018-03-10 DIAGNOSIS — E119 Type 2 diabetes mellitus without complications: Secondary | ICD-10-CM | POA: Diagnosis not present

## 2018-03-10 DIAGNOSIS — E781 Pure hyperglyceridemia: Secondary | ICD-10-CM | POA: Diagnosis not present

## 2018-03-10 DIAGNOSIS — F419 Anxiety disorder, unspecified: Secondary | ICD-10-CM | POA: Insufficient documentation

## 2018-03-10 DIAGNOSIS — Z7982 Long term (current) use of aspirin: Secondary | ICD-10-CM | POA: Insufficient documentation

## 2018-03-10 DIAGNOSIS — Z794 Long term (current) use of insulin: Secondary | ICD-10-CM | POA: Diagnosis not present

## 2018-03-10 DIAGNOSIS — K760 Fatty (change of) liver, not elsewhere classified: Secondary | ICD-10-CM | POA: Insufficient documentation

## 2018-03-10 DIAGNOSIS — R079 Chest pain, unspecified: Secondary | ICD-10-CM | POA: Diagnosis present

## 2018-03-10 DIAGNOSIS — Z79899 Other long term (current) drug therapy: Secondary | ICD-10-CM | POA: Diagnosis not present

## 2018-03-10 DIAGNOSIS — K589 Irritable bowel syndrome without diarrhea: Secondary | ICD-10-CM | POA: Diagnosis not present

## 2018-03-10 DIAGNOSIS — K219 Gastro-esophageal reflux disease without esophagitis: Secondary | ICD-10-CM | POA: Diagnosis not present

## 2018-03-10 LAB — BASIC METABOLIC PANEL
ANION GAP: 14 (ref 5–15)
BUN: 14 mg/dL (ref 6–20)
CALCIUM: 9.3 mg/dL (ref 8.9–10.3)
CO2: 21 mmol/L — ABNORMAL LOW (ref 22–32)
CREATININE: 0.61 mg/dL (ref 0.44–1.00)
Chloride: 101 mmol/L (ref 98–111)
Glucose, Bld: 317 mg/dL — ABNORMAL HIGH (ref 70–99)
Potassium: 4 mmol/L (ref 3.5–5.1)
SODIUM: 136 mmol/L (ref 135–145)

## 2018-03-10 LAB — CBC
HCT: 43.7 % (ref 36.0–46.0)
Hemoglobin: 14.7 g/dL (ref 12.0–15.0)
MCH: 31.3 pg (ref 26.0–34.0)
MCHC: 33.6 g/dL (ref 30.0–36.0)
MCV: 93.2 fL (ref 78.0–100.0)
PLATELETS: 403 10*3/uL — AB (ref 150–400)
RBC: 4.69 MIL/uL (ref 3.87–5.11)
RDW: 13 % (ref 11.5–15.5)
WBC: 12.4 10*3/uL — AB (ref 4.0–10.5)

## 2018-03-10 LAB — I-STAT BETA HCG BLOOD, ED (MC, WL, AP ONLY)

## 2018-03-10 LAB — LIPASE, BLOOD: LIPASE: 30 U/L (ref 11–51)

## 2018-03-10 LAB — HEPATIC FUNCTION PANEL
ALT: 15 U/L (ref 0–44)
AST: 17 U/L (ref 15–41)
Albumin: 3.5 g/dL (ref 3.5–5.0)
Alkaline Phosphatase: 91 U/L (ref 38–126)
Total Bilirubin: 0.6 mg/dL (ref 0.3–1.2)
Total Protein: 6.1 g/dL — ABNORMAL LOW (ref 6.5–8.1)

## 2018-03-10 LAB — I-STAT TROPONIN, ED: TROPONIN I, POC: 0 ng/mL (ref 0.00–0.08)

## 2018-03-10 NOTE — ED Notes (Signed)
Patient still wants to leave AMA despite all education given to her and husband. She verbalised 'I want to go home and sleep on my own bed and when it comes back I will be back". AMA e-signature signed.

## 2018-03-10 NOTE — ED Triage Notes (Signed)
Reports sudden onset of chest pain on left side of chest that radiates into the left shoulder and lower jaw.  Took asa 324mg  at home and took 1 ntg sl with some relief of pain.  Now rating at 1/10 down from a 9/10.

## 2018-03-10 NOTE — ED Notes (Signed)
ED Provider at bedside. 

## 2018-03-10 NOTE — ED Notes (Signed)
Patient said to RN that she doesn't want to be admitted but wants to go home and would like to talk to the Doctor. MD informed, and at bedside now.

## 2018-03-10 NOTE — ED Provider Notes (Signed)
[facility] EMERGENCY DEPARTMENT Provider Note   CSN: [id number] Arrival date & time: 03/10/18  0026     History   Chief Complaint Chief Complaint  Patient presents with  . Chest Pain    HPI Vanessa [name] is a 57 y.o. female.  Patient presents with episode of left-sided chest pain onset while she was doing laundry around 11 PM.  The pain was in the center of her chest and radiated to her left arm and left neck.  She did take aspirin as well as 1 nitroglycerin.  Her daughter who is a paramedic came to the house and perform an EKG which was not a STEMI.  Patient reports she is pain-free now and estimates the pain lasted about 30 minutes.  She has not had pain like this in the past.  There is no no diaphoresis or vomiting.  She denies any chest pain currently.  She does have a history of acid reflux disease and a hiatal hernia with chronic abdominal pain which is unchanged.  She has never had a stress test.  Does have a history of COPD, diabetes, hyperlipidemia and is a smoker.  The history is provided by the patient.  Chest Pain   Associated symptoms include nausea and shortness of breath. Pertinent negatives include no abdominal pain, no dizziness, no fever, no headaches, no vomiting and no weakness.    Past Medical History:  Diagnosis Date  . Asthma    No PFT performed  . COPD (chronic obstructive pulmonary disease) ([place])   . Depression   . Diabetes mellitus 2002  . GERD (gastroesophageal reflux disease)   . Helicobacter pylori (H. pylori) infection    s/p triple therapy  . Hepatic hemangioma   . Hyperlipidemia   . Hypertriglyceridemia   . Panic attacks    mostly Agaraphobia    Patient Active Problem List   Diagnosis Date Noted  . Umbilical hernia without obstruction and without gangrene 08/09/2017  . Essential hypertension, benign 03/11/2014  . Hepatic steatosis 05/30/2013  . Irritable bowel syndrome (IBS) 01/09/2013  . Healthcare maintenance  01/09/2013  . Colon cancer screening 04/15/2012  . Baker's cyst of knee 03/20/2011  . COPD (chronic obstructive pulmonary disease) ([place]) 12/01/2010  . Polycythemia 09/13/2010  . HYPERTRIGLYCERIDEMIA 11/26/2009  . Anxiety and depression 07/23/2008  . Tobacco dependence 10/23/2007  . HLD (hyperlipidemia) 06/14/2006  . ASTHMA 06/14/2006  . GERD 06/14/2006  . Diabetes mellitus, type II, insulin dependent ([place]) 06/26/2002    Past Surgical History:  Procedure Laterality Date  . CESAREAN SECTION     with 3 children  . OVARIAN CYST REMOVAL    . TUBAL LIGATION       OB History    Gravida  5   Para  3   Term  3   Preterm  0   AB  2   Living  3     SAB  1   TAB  1   Ectopic  0   Multiple  0   Live Births               Home Medications    Prior to Admission medications   Medication Sig Start Date End Date Taking? Authorizing Provider  acetaminophen (TYLENOL) 500 MG tablet Take 1,000 mg by mouth every 6 (six) hours as needed for mild pain.    [provider]  albuterol (VENTOLIN HFA) 108 (90 Base) MCG/ACT inhaler INHALE ONE TO TWO PUFFS BY MOUTH EVERY  6 HOURS AS NEEDED FOR WHEEZING 09/04/17   Hoffman, Elza Rafter, DO  ALPRAZolam Duanne Moron) 0.5 MG tablet TAKE 1 TABLET BY MOUTH AT BEDTIME AS NEEDED FOR ANXIETY 11/30/17   Kalman Shan Ratliff, DO  aspirin 81 MG tablet Take 1 tablet (81 mg total) by mouth daily. 01/01/12   Rosalia Hammers, MD  atorvastatin (LIPITOR) 20 MG tablet Take 1 tablet (20 mg total) by mouth daily. 10/19/16   Kalman Shan Ratliff, DO  Blood Glucose Monitoring Suppl (TRUE METRIX METER) DEVI 1 each by Does not apply route 3 (three) times daily before meals. 10/12/16   Kalman Shan Ratliff, DO  budesonide (PULMICORT) 0.5 MG/2ML nebulizer solution Take 2 mLs (0.5 mg total) by nebulization 2 (two) times daily. 06/29/17   Kalman Shan Ratliff, DO  citalopram (CELEXA) 20 MG tablet Take 1 tablet (20 mg total) by mouth daily. 10/10/17    Valinda Party, DO  diphenhydrAMINE (BENADRYL) 25 mg capsule Take 50 mg by mouth every 6 (six) hours as needed for allergies.    [provider]  DULoxetine (CYMBALTA) 20 MG capsule Take 1 capsule (20 mg total) by mouth daily. 03/29/17   Kalman Shan Ratliff, DO  Fluticasone-Salmeterol (ADVAIR) 250-50 MCG/DOSE AEPB Inhale 1 puff into the lungs 2 (two) times daily. 09/04/17   Kalman Shan Ratliff, DO  gabapentin (NEURONTIN) 300 MG capsule Take 1 capsule (300 mg total) by mouth at bedtime. 03/06/16 03/06/17  Asencion Partridge, MD  glucose blood test strip Use as instructed 10/12/16   Kalman Shan Ratliff, DO  ibuprofen (ADVIL,MOTRIN) 200 MG tablet Take 600 mg by mouth every 6 (six) hours as needed for moderate pain.    [provider]  Insulin Detemir (LEVEMIR) 100 UNIT/ML Pen Inject 10 Units into the skin daily at 10 pm. 04/06/17   Kalman Shan Ratliff, DO  insulin glargine (LANTUS) 100 unit/mL SOPN Inject 0.1 mLs (10 Units total) into the skin at bedtime. 03/29/17   Hoffman, Janett Billow Ratliff, DO  ipratropium-albuterol (DUONEB) 0.5-2.5 (3) MG/3ML SOLN Take 3 mLs by nebulization every 6 (six) hours. 06/29/17   Valinda Party, DO  lisinopril (PRINIVIL,ZESTRIL) 5 MG tablet TAKE ONE TABLET BY MOUTH ONCE DAILY 07/04/16   Kalman Shan Ratliff, DO  metFORMIN (GLUCOPHAGE) 1000 MG tablet Take 1 tablet (1,000 mg total) by mouth 2 (two) times daily with a meal. 03/29/17   Hoffman, Elza Rafter, DO  niacin (NIASPAN) 500 MG CR tablet Take 1 tablet (500 mg total) by mouth 2 (two) times daily.    Otho Bellows, MD  omeprazole (PRILOSEC) 20 MG capsule Take 1 capsule (20 mg total) by mouth daily. 11/12/12   Rosalia Hammers, MD  Simethicone (GAS-X EXTRA STRENGTH PO) Take 1 tablet by mouth 3 (three) times daily as needed (gas).    [provider]  SPIRIVA HANDIHALER 18 MCG inhalation capsule PLACE 1 CAPSULE INTO INHALER AND INHALE BY MOUTH DAILY 11/30/17   Kalman Shan Ratliff, DO  tiotropium (SPIRIVA) 18 MCG inhalation capsule Place 1 capsule (18 mcg total) into inhaler and inhale once. 10/12/16 10/12/16  Valinda Party, DO    Family History Family History  Problem Relation Age of Onset  . Colon polyps Mother        benign  . Ovarian cancer Mother   . Breast cancer Maternal Aunt   . Diabetes Maternal Uncle   . Ovarian cancer Maternal Aunt   . Colon cancer Neg Hx   . Stomach cancer Neg Hx  Social History Social History   Tobacco Use  . Smoking status: Current Every Day Smoker    Packs/day: 1.00    Years: 16.00    Pack years: 16.00    Types: Cigarettes  . Smokeless tobacco: Never Used  . Tobacco comment: trying to cut back to 1/2 ppd  Substance Use Topics  . Alcohol use: No    Alcohol/week: 0.0 standard drinks    Comment: none  . Drug use: No     Allergies   Jardiance [empagliflozin] and Avandia [rosiglitazone]   Review of Systems Review of Systems  Constitutional: Negative for activity change, appetite change, fever and unexpected weight change.  HENT: Negative for congestion and rhinorrhea.   Eyes: Negative for visual disturbance.  Respiratory: Positive for chest tightness and shortness of breath.   Cardiovascular: Positive for chest pain.  Gastrointestinal: Positive for nausea. Negative for abdominal pain and vomiting.  Genitourinary: Negative for dysuria, hematuria, vaginal bleeding and vaginal discharge.  Musculoskeletal: Negative for arthralgias and myalgias.  Skin: Negative for rash.  Neurological: Negative for dizziness, weakness and headaches.    all other systems are negative except as noted in the HPI and PMH.    Physical Exam Updated Vital Signs BP 132/67 (BP Location: Right Arm)   Pulse 85   Temp 97.8 F (36.6 C) (Oral)   Resp (!) 21   Ht 5\' 2"  (1.575 m)   Wt 65.3 kg   SpO2 93%   BMI 26.34 kg/m   Physical Exam  Constitutional: She is oriented to person, place, and time. She appears  well-developed and well-nourished. No distress.  HENT:  Head: Normocephalic and atraumatic.  Mouth/Throat: Oropharynx is clear and moist. No oropharyngeal exudate.  Eyes: Pupils are equal, round, and reactive to light. Conjunctivae and EOM are normal.  Neck: Normal range of motion. Neck supple.  No meningismus.  Cardiovascular: Normal rate, regular rhythm, normal heart sounds and intact distal pulses.  No murmur heard. Pulmonary/Chest: Effort normal and breath sounds normal. No respiratory distress. She exhibits no tenderness.  Abdominal: Soft. She exhibits distension. There is no tenderness. There is no rebound and no guarding.  Musculoskeletal: Normal range of motion. She exhibits no edema or tenderness.  Neurological: She is alert and oriented to person, place, and time. No cranial nerve deficit. She exhibits normal muscle tone. Coordination normal.   5/5 strength throughout. CN 2-12 intact.Equal grip strength.   Skin: Skin is warm. Capillary refill takes less than 2 seconds.  Psychiatric: She has a normal mood and affect. Her behavior is normal.  Nursing note and vitals reviewed.    ED Treatments / Results  Labs (all labs ordered are listed, but only abnormal results are displayed) Labs Reviewed  BASIC METABOLIC PANEL - Abnormal; Notable for the following components:      Result Value   CO2 21 (*)    Glucose, Bld 317 (*)    All other components within normal limits  CBC - Abnormal; Notable for the following components:   WBC 12.4 (*)    Platelets 403 (*)    All other components within normal limits  HEPATIC FUNCTION PANEL - Abnormal; Notable for the following components:   Total Protein 6.1 (*)    All other components within normal limits  LIPASE, BLOOD  TROPONIN I  TROPONIN I  TROPONIN I  I-STAT TROPONIN, ED  I-STAT BETA HCG BLOOD, ED ([facility], [facility], [facility] ONLY)    EKG EKG Interpretation  Date/Time:  Sunday March 10 2018 00:34:40  EDT Ventricular Rate:  84 PR  Interval:  164 QRS Duration: 88 QT Interval:  374 QTC Calculation: 441 R Axis:   92 Text Interpretation:  Normal sinus rhythm Rightward axis Septal infarct , age undetermined Abnormal ECG No significant change was found Confirmed by Ezequiel Essex [phone]) on 03/10/2018 1:35:38 AM   Radiology Dg Chest 2 View  Result Date: 03/10/2018 CLINICAL DATA:  Chest pain EXAM: CHEST - 2 VIEW COMPARISON:  06/29/2005 FINDINGS: Mild hyperinflation. Midline trachea. Normal heart size and mediastinal contours. No pleural effusion or pneumothorax. Diffuse peribronchial thickening. No lobar consolidation. IMPRESSION: No acute cardiopulmonary disease. Hyperinflation and interstitial thickening, likely related to COPD/chronic bronchitis. Electronically Signed   By: Abigail Miyamoto M.D.   On: 03/10/2018 01:23    Procedures Procedures (including critical care time)  Medications Ordered in ED Medications - No data to display   Initial Impression / Assessment and Plan / ED Course  I have reviewed the triage vital signs and the nursing notes.  Pertinent labs & imaging results that were available during my care of the patient were reviewed by me and considered in my medical decision making (see chart for details).    Episode of chest pain concerning for angina. Lasted about 30 minutes. Now resolved.   Troponin is negative. Patient remains chest pain-free.  Low suspicion for PE or aortic dissection. Concern for ACS and angina.  Patient is never had a stress test.  Patient's heart score is 4.  Plan admission for cardiac rule out.  Discussed with outpatient clinic residents.  After admission was arranged, patient states that she is leaving and does not want to stay in the hospital. she is tired of being here.  Discussed with patient that she will be leaving [place] because heart attack has not been ruled out and she is at risk for having a heart attack which can be life-threatening. Patient appears  to have capacity to make medical decisions and will leave [place].  Patient understands that she is free to return at any time.  Final Clinical Impressions(s) / ED Diagnoses   Final diagnoses:  Unstable angina [facility])    ED Discharge Orders    None       Ezequiel Essex, MD 03/10/18 [phone]

## 2018-03-12 ENCOUNTER — Other Ambulatory Visit: Payer: Self-pay | Admitting: Internal Medicine

## 2018-03-21 ENCOUNTER — Ambulatory Visit (INDEPENDENT_AMBULATORY_CARE_PROVIDER_SITE_OTHER): Payer: 59 | Admitting: Internal Medicine

## 2018-03-21 ENCOUNTER — Encounter (INDEPENDENT_AMBULATORY_CARE_PROVIDER_SITE_OTHER): Payer: Self-pay

## 2018-03-21 ENCOUNTER — Encounter: Payer: Self-pay | Admitting: Internal Medicine

## 2018-03-21 VITALS — BP 126/62 | HR 80 | Temp 98.4°F | Wt 151.6 lb

## 2018-03-21 DIAGNOSIS — Z7951 Long term (current) use of inhaled steroids: Secondary | ICD-10-CM

## 2018-03-21 DIAGNOSIS — E119 Type 2 diabetes mellitus without complications: Secondary | ICD-10-CM

## 2018-03-21 DIAGNOSIS — J449 Chronic obstructive pulmonary disease, unspecified: Secondary | ICD-10-CM

## 2018-03-21 DIAGNOSIS — Z720 Tobacco use: Secondary | ICD-10-CM

## 2018-03-21 DIAGNOSIS — F419 Anxiety disorder, unspecified: Secondary | ICD-10-CM

## 2018-03-21 DIAGNOSIS — I10 Essential (primary) hypertension: Secondary | ICD-10-CM | POA: Diagnosis not present

## 2018-03-21 DIAGNOSIS — Z79899 Other long term (current) drug therapy: Secondary | ICD-10-CM

## 2018-03-21 DIAGNOSIS — Z794 Long term (current) use of insulin: Secondary | ICD-10-CM | POA: Diagnosis not present

## 2018-03-21 DIAGNOSIS — F329 Major depressive disorder, single episode, unspecified: Secondary | ICD-10-CM | POA: Diagnosis not present

## 2018-03-21 DIAGNOSIS — Z23 Encounter for immunization: Secondary | ICD-10-CM

## 2018-03-21 LAB — POCT GLYCOSYLATED HEMOGLOBIN (HGB A1C): Hemoglobin A1C: 8.6 % — AB (ref 4.0–5.6)

## 2018-03-21 LAB — GLUCOSE, CAPILLARY: Glucose-Capillary: 149 mg/dL — ABNORMAL HIGH (ref 70–99)

## 2018-03-21 MED ORDER — LISINOPRIL 5 MG PO TABS: 5.0000 mg | ORAL_TABLET | Freq: Every day | ORAL | 2 refills | Status: DC

## 2018-03-21 NOTE — Progress Notes (Signed)
   CC: Follow-up on type 2 diabetes  HPI:  Vanessa [name] is a 57 y.o. female with history noted below that presents to the internal medicine clinic for follow-up on type 2 diabetes.  Please see problem based charting for the status of patient's chronic medical conditions.  Past Medical History:  Diagnosis Date  . Asthma    No PFT performed  . COPD (chronic obstructive pulmonary disease) ([place])   . Depression   . Diabetes mellitus 2002  . GERD (gastroesophageal reflux disease)   . Helicobacter pylori (H. pylori) infection    s/p triple therapy  . Hepatic hemangioma   . Hyperlipidemia   . Hypertriglyceridemia   . Panic attacks    mostly Agaraphobia    Review of Systems:  Review of Systems  Respiratory: Negative for cough, sputum production and shortness of breath.   Cardiovascular: Negative for chest pain.  Gastrointestinal: Negative for abdominal pain.  Genitourinary: Negative for dysuria, frequency and urgency.  Psychiatric/Behavioral: Positive for depression. Negative for suicidal ideas.     Physical Exam:  Vitals:   03/21/18 1419  BP: 126/62  Pulse: 80  Temp: 98.4 F (36.9 C)  TempSrc: Oral  SpO2: (!) 89%  Weight: 151 lb 9.6 oz (68.8 kg)   Physical Exam  Constitutional: She is well-developed, well-nourished, and in no distress.  Cardiovascular: Normal rate, regular rhythm and normal heart sounds. Exam reveals no gallop and no friction rub.  No murmur heard. Pulmonary/Chest: Effort normal and breath sounds normal. No respiratory distress. She has no wheezes. She has no rales.  Musculoskeletal: She exhibits no edema.     Assessment & Plan:   See encounters tab for problem based medical decision making.   Patient discussed with Dr. Beryle Beams

## 2018-03-21 NOTE — Patient Instructions (Signed)
Vanessa [name],  It was a pleasure seeing you today.  Please follow up in 3 months.

## 2018-03-24 MED ORDER — CITALOPRAM HYDROBROMIDE 40 MG PO TABS: 40.0000 mg | ORAL_TABLET | Freq: Every day | ORAL | 2 refills | Status: DC

## 2018-03-24 NOTE — Assessment & Plan Note (Signed)
Assessment: Major depressive disorder Patient is currently on Celexa 20 mg daily.  PHQ 9 is 16 today.  Will increase Celexa to 40 mg daily  Plan -Increase Celexa to 40 mg daily

## 2018-03-24 NOTE — Assessment & Plan Note (Signed)
Assessment: COPD Patient currently uses Advair and Spiriva daily.  Today her O2 sats were 89%.  She denies shortness of breath, cough with sputum production, or wheezing.  Patient is a current smoker and is not ready to quit at this time.  Patient's oxygen levels today are likely her baseline.  No signs of COPD exacerbation or acute respiratory issues.  Plan -Continue current inhalers

## 2018-03-24 NOTE — Assessment & Plan Note (Signed)
Assessment: Essential hypertension Patient currently takes 5 mg of lisinopril.  Today's blood pressure is 126/62, stable and at goal of <130/80.  Plan -Continue lisinopril 5 mg daily

## 2018-03-24 NOTE — Assessment & Plan Note (Addendum)
Assessment: Type 2 diabetes Patient currently takes 1000mg BID of metformin and Levemir 10 mg at night.  Patient did not have her glucose meter but states that her morning sugars are in the 200s consistently  Hemoglobin A1c today is 8.6.  Will increase Levemir from 10 mg to 15 mg at bedtime.  Plan -Continue Metformin 1000mg  BID -Increase Levemir to 15 mg

## 2018-03-25 NOTE — Progress Notes (Signed)
Medicine attending: Medical history, presenting problems, physical findings, and medications, reviewed with resident physician Dr Jessica Hoffman on the day of the patient visit and I concur with her evaluation and management plan. 

## 2018-04-14 ENCOUNTER — Other Ambulatory Visit: Payer: Self-pay | Admitting: Internal Medicine

## 2018-05-02 ENCOUNTER — Telehealth: Payer: Self-pay | Admitting: Internal Medicine

## 2018-06-13 ENCOUNTER — Encounter: Payer: Self-pay | Admitting: Internal Medicine

## 2018-06-13 ENCOUNTER — Other Ambulatory Visit: Payer: Self-pay | Admitting: Internal Medicine

## 2018-06-30 ENCOUNTER — Other Ambulatory Visit: Payer: Self-pay | Admitting: Internal Medicine

## 2018-07-02 NOTE — Telephone Encounter (Signed)
Next appt scheduled 09/12/18 with PCP.

## 2018-07-26 ENCOUNTER — Other Ambulatory Visit: Payer: Self-pay | Admitting: Internal Medicine

## 2018-07-26 MED ORDER — ALBUTEROL SULFATE HFA 108 (90 BASE) MCG/ACT IN AERS: INHALATION_SPRAY | RESPIRATORY_TRACT | 4 refills | Status: DC

## 2018-07-26 NOTE — Telephone Encounter (Signed)
Needs refill on albuterol (VENTOLIN HFA) 108 (90 Base) MCG/ACT inhaler at [place], [place] - [address];pt contact [phone]

## 2018-09-10 ENCOUNTER — Other Ambulatory Visit: Payer: Self-pay | Admitting: Internal Medicine

## 2018-09-12 ENCOUNTER — Encounter: Payer: Self-pay | Admitting: Internal Medicine

## 2018-09-20 ENCOUNTER — Other Ambulatory Visit: Payer: Self-pay | Admitting: Internal Medicine

## 2018-09-20 MED ORDER — LISINOPRIL 5 MG PO TABS: 5.0000 mg | ORAL_TABLET | Freq: Every day | ORAL | 1 refills | Status: DC

## 2018-09-20 MED ORDER — TIOTROPIUM BROMIDE MONOHYDRATE 18 MCG IN CAPS: 18.0000 ug | ORAL_CAPSULE | Freq: Every day | RESPIRATORY_TRACT | 1 refills | Status: DC

## 2018-09-20 MED ORDER — ALPRAZOLAM 0.5 MG PO TABS: 0.5000 mg | ORAL_TABLET | Freq: Every evening | ORAL | 0 refills | Status: DC | PRN

## 2018-09-20 NOTE — Telephone Encounter (Signed)
Refill Request   SPIRIVA HANDIHALER 18 MCG inhalation capsule  ALPRAZolam (XANAX) 0.5 MG tablet  lisinopril (PRINIVIL,ZESTRIL) 5 MG tablet  [facility] [place], [place] - [address]

## 2018-09-24 ENCOUNTER — Telehealth: Payer: Self-pay | Admitting: Internal Medicine

## 2018-09-24 NOTE — Telephone Encounter (Signed)
Call made to patient-states she is no longer able to tolerate metformin due to GI problems. she still taking her levemir 15 units at night. Glucose runs a little high throughout the day- 349 yesterday.  She states her husband is currently taking Soliqua 100/33 25 units once daily and she would like to know if Willeen Niece is an option for her.  Will send to pcp for review, please advise.Despina Hidden Cassady3/31/20204:25 PM   Of note, chart reflects pt  is on Lantus, but pt she states her insurance would not pay for it, so it was changed to levemir.Marland Kitchen

## 2018-09-24 NOTE — Telephone Encounter (Signed)
Pt requesting a nurse to callback [phone]

## 2018-09-27 NOTE — Telephone Encounter (Signed)
She needs a tele visit.  You can place her on my CC on thrusday April 9th

## 2018-10-03 ENCOUNTER — Other Ambulatory Visit: Payer: Self-pay

## 2018-10-03 ENCOUNTER — Ambulatory Visit (INDEPENDENT_AMBULATORY_CARE_PROVIDER_SITE_OTHER): Payer: 59 | Admitting: Internal Medicine

## 2018-10-03 DIAGNOSIS — E119 Type 2 diabetes mellitus without complications: Secondary | ICD-10-CM

## 2018-10-03 DIAGNOSIS — Z794 Long term (current) use of insulin: Secondary | ICD-10-CM | POA: Diagnosis not present

## 2018-10-03 MED ORDER — INSULIN GLARGINE-LIXISENATIDE 100-33 UNT-MCG/ML ~~LOC~~ SOPN: 15.0000 [IU] | PEN_INJECTOR | Freq: Every morning | SUBCUTANEOUS | 2 refills | Status: DC

## 2018-10-08 NOTE — Progress Notes (Signed)
   CC: Follow up on type II diabetes  HPI:  [name] is a 58 y.o. female with history noted below.  This is a telephone encounter between ALBERTHA BEATTIE and Boyd Kerbs on 10/03/2018 for type II diabetes follow up. The visit was conducted with the patient located at home and Boyd Kerbs at [facility]. The patient has consented to being evaluated through a telephone encounter and understands the associated risks (an examination cannot be done and the patient may need to come in for an appointment) / benefits (allows the patient to remain at home, decreasing exposure to coronavirus). I personally spent 5 minutes on medical discussion.   Please see problem based charting for the status of patients chronic medical conditions.  Past Medical History:  Diagnosis Date  . Asthma    No PFT performed  . COPD (chronic obstructive pulmonary disease) ([place])   . Depression   . Diabetes mellitus 2002  . GERD (gastroesophageal reflux disease)   . Helicobacter pylori (H. pylori) infection    s/p triple therapy  . Hepatic hemangioma   . Hyperlipidemia   . Hypertriglyceridemia   . Panic attacks    mostly Agaraphobia    Review of Systems:  Review of Systems  Constitutional: Negative for chills and fever.  Respiratory: Negative for shortness of breath.   Cardiovascular: Negative for chest pain.  Gastrointestinal: Negative for abdominal pain, nausea and vomiting.     Physical Exam:  There were no vitals filed for this visit. as a result of telehealth visit.  Assessment & Plan:   See encounters tab for problem based medical decision making.   Patient discussed with Dr. Daryll Drown

## 2018-10-09 NOTE — Assessment & Plan Note (Addendum)
HPI:  Patient states she recently stopped her metformin 2 weeks ago due to GI side effects.  She had been taking the medication for 3-4 years but no longer wants to take due to side effects.  She also currently takes levemir 15 units daily.  Since stopping metformin she has noticed that her sugars have gone from 150 to 300s.  She is asking if she could be switched to soliqua  Assessment:  Uncontrolled type II diabetes Last hemoglobin A1C was 8.6, 02/2018.  With blood glucose uncontrolled will try [place] and stop levemir.  Plan -stop levemir -Soliqua 15 units daily and can add 2-4 units every week if blood glucose not less than 150. -return for hemoglobin A1C in 3 months.

## 2018-10-12 NOTE — Progress Notes (Signed)
Internal Medicine Clinic Attending  Case discussed with Dr. Heber Gibson soon after the resident saw the patient.  We reviewed the resident's history, telephone conversation and pertinent patient test results.  I agree with the assessment, diagnosis, and plan of care documented in the resident's note.

## 2018-10-24 ENCOUNTER — Other Ambulatory Visit: Payer: Self-pay | Admitting: Internal Medicine

## 2018-10-25 NOTE — Telephone Encounter (Signed)
Needs refill on ADVAIR DISKUS 250-50 MCG/DOSE AEPB  ;pt contact [place], [place] - [address]

## 2018-11-25 ENCOUNTER — Other Ambulatory Visit: Payer: Self-pay | Admitting: Internal Medicine

## 2018-11-26 ENCOUNTER — Other Ambulatory Visit: Payer: Self-pay | Admitting: Internal Medicine

## 2018-11-28 ENCOUNTER — Ambulatory Visit (INDEPENDENT_AMBULATORY_CARE_PROVIDER_SITE_OTHER): Payer: 59 | Admitting: Internal Medicine

## 2018-11-28 ENCOUNTER — Other Ambulatory Visit: Payer: Self-pay

## 2018-11-28 DIAGNOSIS — Z79899 Other long term (current) drug therapy: Secondary | ICD-10-CM

## 2018-11-28 DIAGNOSIS — F419 Anxiety disorder, unspecified: Secondary | ICD-10-CM

## 2018-11-28 DIAGNOSIS — E119 Type 2 diabetes mellitus without complications: Secondary | ICD-10-CM

## 2018-11-28 DIAGNOSIS — Z794 Long term (current) use of insulin: Secondary | ICD-10-CM

## 2018-11-28 DIAGNOSIS — E118 Type 2 diabetes mellitus with unspecified complications: Secondary | ICD-10-CM

## 2018-11-28 DIAGNOSIS — F329 Major depressive disorder, single episode, unspecified: Secondary | ICD-10-CM | POA: Diagnosis not present

## 2018-11-28 DIAGNOSIS — K580 Irritable bowel syndrome with diarrhea: Secondary | ICD-10-CM

## 2018-11-28 DIAGNOSIS — R1031 Right lower quadrant pain: Secondary | ICD-10-CM

## 2018-11-28 NOTE — Progress Notes (Signed)
  [facility] Internal Medicine Residency Telephone Encounter Continuity Care Appointment  HPI:   This telephone encounter was created for Ms. Vanessa [name] on 11/28/2018 for the following purpose/cc type II diabetes, right lower quadrant pain and depression/anxiety.   Past Medical History:  Past Medical History:  Diagnosis Date  . Asthma    No PFT performed  . COPD (chronic obstructive pulmonary disease) ([place])   . Depression   . Diabetes mellitus 2002  . GERD (gastroesophageal reflux disease)   . Helicobacter pylori (H. pylori) infection    s/p triple therapy  . Hepatic hemangioma   . Hyperlipidemia   . Hypertriglyceridemia   . Panic attacks    mostly Agaraphobia      ROS:  Review of Systems  Constitutional: Negative for chills, fever and malaise/fatigue.  Respiratory: Negative for shortness of breath.   Cardiovascular: Negative for chest pain.  Gastrointestinal: Positive for abdominal pain. Negative for nausea and vomiting.  Genitourinary: Negative for dysuria, flank pain, frequency and urgency.  Musculoskeletal: Negative for back pain.  Psychiatric/Behavioral: Negative for depression. The patient is nervous/anxious.      Assessment / Plan / Recommendations:   Please see A&P under problem oriented charting for assessment of the patient's acute and chronic medical conditions.   As always, pt is advised that if symptoms worsen or new symptoms arise, they should go to an urgent care facility or to to ER for further evaluation.   Consent and Medical Decision Making:   Patient discussed with Dr. Evette Doffing  This is a telephone encounter between Vanessa [name] and Boyd Kerbs on 11/28/2018 for type II diabetes, right lower quadrant pain and depression/anxiety. The visit was conducted with the patient located at home and Boyd Kerbs at [facility]. The patient's identity was confirmed using their DOB and current address. The patient has consented to being evaluated through a  telephone encounter and understands the associated risks (an examination cannot be done and the patient may need to come in for an appointment) / benefits (allows the patient to remain at home, decreasing exposure to coronavirus). I personally spent 27 minutes on medical discussion.

## 2018-11-29 DIAGNOSIS — R1031 Right lower quadrant pain: Secondary | ICD-10-CM | POA: Insufficient documentation

## 2018-11-29 MED ORDER — CITALOPRAM HYDROBROMIDE 40 MG PO TABS: 40.0000 mg | ORAL_TABLET | Freq: Every day | ORAL | 0 refills | Status: DC

## 2018-11-29 MED ORDER — DICYCLOMINE HCL 20 MG PO TABS: 20.0000 mg | ORAL_TABLET | Freq: Three times a day (TID) | ORAL | 0 refills | Status: DC

## 2018-11-29 NOTE — Assessment & Plan Note (Signed)
HPI: On 4/9 patient was started on Soliqua 15 units daily.  Patient states her blood glucose levels average about 130-135 daily.  Her highest level was 200.  Overall she states she is doing well with the medicine  Assessment: Uncontrolled type 2 diabetes With consistent use of current medication I suspect hemoglobin A1c will improved from previous of 8.6.  Next hemoglobin A1c will be due in July.  Plan -Continue Soliqua at 15 units daily -Hemoglobin A1c due July 2020

## 2018-11-29 NOTE — Assessment & Plan Note (Signed)
HPI: Patient reports a one-month history of intermittent right lower abdominal pain that she describes as cramp-like.  It comes on spontaneously and resolves on its own.  She states that it feels like her ovary is causing her pain.  She reports worsening pain when she urinates however she denies hematuria, dysuria, urinary frequency or urgency.  She reports associated symptoms of abdominal bloating and heartburn.  She has tried tegument and gas relief medicine without benefit.  She denies fever/chills, nausea/vomiting or diarrhea.  Assessment: Right lower quadrant abdominal pain Unclear etiology.  Patient has appointment with OB/GYN on 6/24 to follow her ovarian cyst.  Upon further review patient had a colonoscopy in 2018 and a CT abdomen pelvis scan in 2017 that showed diverticular disease on the left and the right colon. I do not think she has diverticulitis at this time due to length and nature of symptoms. She also has a history of IBS- D for which she takes Imodium.    Her cramp like pain may be related to her IBS-D.  Will try bentyl as she has not tried this in the past and see if this helps.  We will need to monitor and told patient to call clinic if she develops increasing pain, fever/chills nausea or vomiting.    Plan -Start Bentyl

## 2018-11-29 NOTE — Assessment & Plan Note (Signed)
HPI: Patient reports adherence to Celexa 40 mg daily and Xanax 0.5 mg as needed.  30 pills of Xanax last in about 3 months.  With the pandemic patient is having more anxiety and asked for an increase in her Xanax.  Assessment: Depression and anxiety Patient states that depressive symptoms are well controlled with Celexa 40 mg daily.  Due to coronavirus pandemic patient states she is becoming increasingly anxious being at home.  I recommended discussing medications with the psychiatrist and patient was agreeable  Plan -Continue Celexa 40 mg daily -Continue Xanax 0.5 mg #30 -Psychiatrist referral

## 2018-12-02 NOTE — Progress Notes (Signed)
Internal Medicine Clinic Attending  Case discussed with Dr. Hoffman at the time of the visit.  We reviewed the resident's history and exam and pertinent patient test results.  I agree with the assessment, diagnosis, and plan of care documented in the resident's note.  

## 2018-12-03 ENCOUNTER — Telehealth: Payer: Self-pay | Admitting: Licensed Clinical Social Worker

## 2018-12-03 NOTE — Addendum Note (Signed)
Addended by: Hulan Fray on: 12/03/2018 03:37 PM   Modules accepted: Orders

## 2018-12-03 NOTE — Telephone Encounter (Signed)
Patient was contacted to follow up on a referral from her PCP. Patient is declining the need for a referral to psy or counseling at this time. Patient is aware that if she changes her mind she can contact her doctor.

## 2018-12-23 ENCOUNTER — Encounter: Payer: Self-pay | Admitting: *Deleted

## 2018-12-30 ENCOUNTER — Other Ambulatory Visit: Payer: Self-pay | Admitting: Internal Medicine

## 2018-12-30 MED ORDER — ALBUTEROL SULFATE HFA 108 (90 BASE) MCG/ACT IN AERS: INHALATION_SPRAY | RESPIRATORY_TRACT | 3 refills | Status: DC

## 2018-12-30 NOTE — Telephone Encounter (Signed)
Needs refill on albuterol (VENTOLIN HFA) 108 (90 Base) MCG/ACT inhaler  ;pt contact [place] ([place]), [place] - [address]

## 2019-01-16 ENCOUNTER — Encounter: Payer: 59 | Admitting: Internal Medicine

## 2019-02-11 ENCOUNTER — Other Ambulatory Visit: Payer: Self-pay | Admitting: *Deleted

## 2019-02-12 MED ORDER — DICYCLOMINE HCL 20 MG PO TABS: 20.0000 mg | ORAL_TABLET | Freq: Three times a day (TID) | ORAL | 0 refills | Status: DC

## 2019-02-27 ENCOUNTER — Telehealth: Payer: Self-pay | Admitting: *Deleted

## 2019-02-27 ENCOUNTER — Encounter: Payer: 59 | Admitting: Internal Medicine

## 2019-02-27 NOTE — Telephone Encounter (Signed)
Call to patient about missed appointment today.  Message left on voicemail to call the Clinics.  Regino Schultze Ingeborg Fite RN 02/27/2019 9:30 AM.

## 2019-03-25 ENCOUNTER — Other Ambulatory Visit: Payer: Self-pay | Admitting: *Deleted

## 2019-03-25 MED ORDER — ADVAIR DISKUS 250-50 MCG/DOSE IN AEPB: INHALATION_SPRAY | RESPIRATORY_TRACT | 0 refills | Status: DC

## 2019-03-25 NOTE — Telephone Encounter (Signed)
Spoke with the patient.  She is sch with her PCP on 04/03/2019.

## 2019-03-25 NOTE — Telephone Encounter (Signed)
Last appt 11/28/18 No show 02/27/19 No future appts scheduled-request sent to front office for scheduling.Despina Hidden Cassady9/29/20201:37 PM

## 2019-03-31 ENCOUNTER — Other Ambulatory Visit: Payer: Self-pay | Admitting: Internal Medicine

## 2019-04-03 ENCOUNTER — Encounter: Payer: 59 | Admitting: Internal Medicine

## 2019-04-10 ENCOUNTER — Other Ambulatory Visit: Payer: Self-pay

## 2019-04-10 ENCOUNTER — Ambulatory Visit: Payer: 59 | Admitting: Internal Medicine

## 2019-04-10 ENCOUNTER — Encounter: Payer: Self-pay | Admitting: Internal Medicine

## 2019-04-10 VITALS — BP 131/64 | HR 101 | Temp 98.7°F | Ht 62.0 in | Wt 153.0 lb

## 2019-04-10 DIAGNOSIS — E119 Type 2 diabetes mellitus without complications: Secondary | ICD-10-CM

## 2019-04-10 DIAGNOSIS — Z79899 Other long term (current) drug therapy: Secondary | ICD-10-CM

## 2019-04-10 DIAGNOSIS — Z0000 Encounter for general adult medical examination without abnormal findings: Secondary | ICD-10-CM

## 2019-04-10 DIAGNOSIS — E785 Hyperlipidemia, unspecified: Secondary | ICD-10-CM

## 2019-04-10 DIAGNOSIS — D45 Polycythemia vera: Secondary | ICD-10-CM

## 2019-04-10 DIAGNOSIS — E781 Pure hyperglyceridemia: Secondary | ICD-10-CM

## 2019-04-10 DIAGNOSIS — Z794 Long term (current) use of insulin: Secondary | ICD-10-CM

## 2019-04-10 DIAGNOSIS — E7800 Pure hypercholesterolemia, unspecified: Secondary | ICD-10-CM

## 2019-04-10 DIAGNOSIS — K439 Ventral hernia without obstruction or gangrene: Secondary | ICD-10-CM

## 2019-04-10 DIAGNOSIS — Z23 Encounter for immunization: Secondary | ICD-10-CM

## 2019-04-10 DIAGNOSIS — K589 Irritable bowel syndrome without diarrhea: Secondary | ICD-10-CM

## 2019-04-10 DIAGNOSIS — I10 Essential (primary) hypertension: Secondary | ICD-10-CM | POA: Diagnosis not present

## 2019-04-10 DIAGNOSIS — Z7951 Long term (current) use of inhaled steroids: Secondary | ICD-10-CM

## 2019-04-10 DIAGNOSIS — E118 Type 2 diabetes mellitus with unspecified complications: Secondary | ICD-10-CM | POA: Diagnosis not present

## 2019-04-10 DIAGNOSIS — J449 Chronic obstructive pulmonary disease, unspecified: Secondary | ICD-10-CM | POA: Diagnosis not present

## 2019-04-10 DIAGNOSIS — D751 Secondary polycythemia: Secondary | ICD-10-CM

## 2019-04-10 LAB — GLUCOSE, CAPILLARY: Glucose-Capillary: 291 mg/dL — ABNORMAL HIGH (ref 70–99)

## 2019-04-10 LAB — POCT GLYCOSYLATED HEMOGLOBIN (HGB A1C): Hemoglobin A1C: 9.7 % — AB (ref 4.0–5.6)

## 2019-04-10 MED ORDER — GABAPENTIN 300 MG PO CAPS: 300.0000 mg | ORAL_CAPSULE | Freq: Every day | ORAL | 2 refills | Status: DC

## 2019-04-10 MED ORDER — DICYCLOMINE HCL 20 MG PO TABS: 20.0000 mg | ORAL_TABLET | Freq: Three times a day (TID) | ORAL | 3 refills | Status: DC

## 2019-04-10 NOTE — Patient Instructions (Addendum)
Ms. Albers,  It was nice meeting you today!   Today we talked about your diabetes. The A1c was elevated today. Please restart your Soliqua 15 units and continue taking your blood sugars at home. Follow-up with me in 1 month and bring your meter so we can see how the sugars are doing.  We have taken some blood work on your today to look at your kidney function, electrolytes, cholesterol, and blood counts. I will call you with any abnormal results, otherwise we will go over it at your next appointment.   Please remember to schedule an appointment with your OB/GYN for a PAP smear and mammogram this year.

## 2019-04-10 NOTE — Progress Notes (Signed)
   CC: diabetes follow-up  HPI:  [name] is a 58 y.o. F with significant PMH as outlined below who presents today for follow-up of her diabetes, HTN, and COPD. Please see problem-based charting for continued management of her ongoing medical conditions.   Past Medical History:  Diagnosis Date  . Asthma    No PFT performed  . COPD (chronic obstructive pulmonary disease) ([place])   . Depression   . Diabetes mellitus 2002  . GERD (gastroesophageal reflux disease)   . Helicobacter pylori (H. pylori) infection    s/p triple therapy  . Hepatic hemangioma   . Hyperlipidemia   . Hypertriglyceridemia   . Panic attacks    mostly Agaraphobia   Review of Systems:   Review of Systems  Constitutional: Negative for chills and fever.  Respiratory: Positive for wheezing. Negative for cough and shortness of breath.   Cardiovascular: Negative for chest pain.  Gastrointestinal: Negative for abdominal pain, nausea and vomiting.  Genitourinary: Negative for dysuria and frequency.  Skin: Negative for itching.  Endo/Heme/Allergies: Negative for polydipsia.   Physical Exam:  Vitals:   04/10/19 1347  BP: 131/64  Pulse: (!) 101  Temp: 98.7 F (37.1 C)  TempSrc: Oral  SpO2: 94%  Weight: 153 lb (69.4 kg)  Height: 5\' 2"  (1.575 m)   Physical Exam Vitals signs and nursing note reviewed.  Constitutional:      General: She is not in acute distress.    Appearance: She is not ill-appearing.  Eyes:     Comments: Bilateral xanthomas   Cardiovascular:     Rate and Rhythm: Regular rhythm. Tachycardia present.     Heart sounds: Normal heart sounds.  Pulmonary:     Effort: Pulmonary effort is normal. No tachypnea or respiratory distress.     Breath sounds: Normal breath sounds. Decreased air movement present. No decreased breath sounds or wheezing.  Abdominal:     General: Bowel sounds are normal.     Palpations: Abdomen is soft.     Tenderness: There is no abdominal tenderness.     Hernia:  A hernia is present. Hernia is present in the ventral area.  Neurological:     Mental Status: She is alert.    Assessment & Plan:   See Encounters Tab for problem based charting.  Patient seen with Dr. Rebeca Alert

## 2019-04-11 LAB — BMP8+ANION GAP
Anion Gap: 16 mmol/L (ref 10.0–18.0)
BUN/Creatinine Ratio: 21 (ref 9–23)
BUN: 12 mg/dL (ref 6–24)
CO2: 20 mmol/L (ref 20–29)
Calcium: 10 mg/dL (ref 8.7–10.2)
Chloride: 98 mmol/L (ref 96–106)
Creatinine, Ser: 0.58 mg/dL (ref 0.57–1.00)
GFR calc Af Amer: 117 mL/min/{1.73_m2} (ref >59)
GFR calc non Af Amer: 102 mL/min/{1.73_m2} (ref >59)
Glucose: 191 mg/dL — ABNORMAL HIGH (ref 65–99)
Potassium: 5.1 mmol/L (ref 3.5–5.2)
Sodium: 134 mmol/L (ref 134–144)

## 2019-04-11 LAB — CBC
Hematocrit: 50.6 % — ABNORMAL HIGH (ref 34.0–46.6)
Hemoglobin: 17.3 g/dL — ABNORMAL HIGH (ref 11.1–15.9)
MCH: 30.8 pg (ref 26.6–33.0)
MCHC: 34.2 g/dL (ref 31.5–35.7)
MCV: 90 fL (ref 79–97)
Platelets: 427 10*3/uL (ref 150–450)
RBC: 5.62 x10E6/uL — ABNORMAL HIGH (ref 3.77–5.28)
RDW: 12.5 % (ref 11.7–15.4)
WBC: 12.1 10*3/uL — ABNORMAL HIGH (ref 3.4–10.8)

## 2019-04-11 LAB — LIPID PANEL
Chol/HDL Ratio: 10 ratio — ABNORMAL HIGH (ref 0.0–4.4)
Cholesterol, Total: 291 mg/dL — ABNORMAL HIGH (ref 100–199)
HDL: 29 mg/dL — ABNORMAL LOW (ref >39)
LDL Chol Calc (NIH): 141 mg/dL — ABNORMAL HIGH (ref 0–99)
Triglycerides: 634 mg/dL (ref 0–149)
VLDL Cholesterol Cal: 121 mg/dL — ABNORMAL HIGH (ref 5–40)

## 2019-04-17 NOTE — Assessment & Plan Note (Signed)
Pt using Advair 273mcg and Spiriva 23mcg daily. Denies cough, shortness of breath, or sputum production. Endorses hearing audible wheezing occasionally when she exerts herself. Has  Albuterol rescue inhaler and Duonebs at home, which she has not needed to use in the past 4-5 months. She is a current smoker and at this time is not interested in quitting. Her O2 sat is 94% today.  Assessment - COPD  Plan - continue current inhalers as above - pt has never had PFTs due to prior lack of insurance, would benefit from formal evaluation in the future

## 2019-04-17 NOTE — Assessment & Plan Note (Addendum)
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 Recheck of CBC today showed Hgb 17.3. She also has a history of chronic leukocytosis and thrombocytosis per chart review, which would meet diagnostic criteria for polycythemia vera. Peripheral smear in 01/2016 showed unremarkable RBC morphology.  Discussed results of CBC with pt over the telephone. Pt denies ever having a formal work-up for her COPD or polycythemia. Explained the need to additional lab testing at her next appointment to better understand the root cause. She states that this diagnosis has always concerned her and she would be interested in further testing.   Plan - evaluate Epo, JAK2 and CarboxyHb at follow-up in 4 weeks - could also consider ABG to investigate CO2 retention

## 2019-04-17 NOTE — Assessment & Plan Note (Signed)
Pt is due for mammogram and PAP smear. Offered referral for mammogram and the option to get PAP smear in clinic here. She states that she gets these done at her OB/GYN's office and will call them to schedule these.

## 2019-04-17 NOTE — Assessment & Plan Note (Signed)
Pt not currently taking any diabetes medications. She stopped taking her Soliqua 15 units daily approximately 1-2 months ago because her meter at home was reading sugars between 120-160 and she was afraid the medication would drop her sugar to low. Endorses checking her sugars 2-3 times per day. Pt does not have her meter with her in clinic today. Her A1c is 9.7 (previous measurement was 8.6 in 02/2018). She is frustrated at her A1c increase and because of the discrepancy between her meter and the A1c measurement. Endorses having trouble managing her diet at home secondary to her IBS. Pt was previously seen by Butch Penny in clinic and is willing to reestablish with her again.   Assessment - Uncontrolled Type 2 Diabetes  Plan - restart Soliqua at 15 units daily - follow-up in 4 weeks where pt will bring her meter - new referral placed to reestablish with Butch Penny - will check urine microalbumin/creatine ratio next visit - BMP today unremarkable aside from Glc 191   Lab Results  Component Value Date   HGBA1C 9.7 (A) 04/10/2019

## 2019-04-17 NOTE — Assessment & Plan Note (Signed)
Pt has a history of hyperlipidemia and hypertriglyceridemia. She is not currently taking any medications for these conditions. She has been unable to tolerate statins in the past secondary to muscle aches and nausea. Was also on Niacin and fenofibrate, which she has since stopped taking. 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  Non-fasting lipid panel today significant for triglycerides 634, total cholesterol 291, HDL 29, and LDL 141. Discussed these results with the pt over the telephone. Explained that she is at an increased risk for MI or stroke based on these lab results and history of smoking and diabetes. Encouraged her to consider restarting a statin. Pt stated she would think about it and we could discuss at follow-up in 4 weeks.  Lipid Panel     Component Value Date/Time   CHOL 291 (H) 04/10/2019 1500   TRIG 634 (HH) 04/10/2019 1500   HDL 29 (L) 04/10/2019 1500   CHOLHDL 10.0 (H) 04/10/2019 1500   CHOLHDL 10.3 12/03/2014 1542   VLDL NOT CALC 12/03/2014 1542   LDLCALC 141 (H) 04/10/2019 1500   LDLDIRECT 158 (H) 12/03/2014 1542   LABVLDL 121 (H) 04/10/2019 1500

## 2019-04-17 NOTE — Assessment & Plan Note (Signed)
Pt endorses taking lisinopril 5mg  daily. Denies any medication side effects, cough, chest pain, dizziness, or headaches. BP today 131/64.  Assessment - Well controlled essential hypertension  Plan - continue lisinopril 5mg  daily  BP Readings from Last 3 Encounters:  04/10/19 131/64  03/21/18 126/62  03/10/18 (!) 115/53

## 2019-04-21 NOTE — Progress Notes (Addendum)
Internal Medicine Clinic Attending  I saw and evaluated the patient.  I personally confirmed the key portions of the history and exam documented by Dr. Ronnald Ramp and I reviewed pertinent patient test results.  The assessment, diagnosis, and plan were formulated together and I agree with the documentation in the resident's note.  Agree with further workup of her polycythemia, though COPD is most likely etiology as noted by Dr. Ronnald Ramp. With her hypertriglyceridemia and hypercholesterolemia, agree with continued discussion of resumption of statin +/- fibrate, perhaps considering low dose statin and fibrate to minimize side effects.  Lenice Pressman, M.D., Ph.D.

## 2019-04-24 ENCOUNTER — Telehealth: Payer: Self-pay | Admitting: *Deleted

## 2019-04-24 NOTE — Telephone Encounter (Signed)
Received fax from [facility] with the following statement: Naproxen  500mg   Tabs is NOT COVERED under your patient's insurance coverage.  We are requesting approval to dispense a covered alternative.   Please include strength,  directions, quantity 90 supply and number of refills. Regenia Skeeter, Lasonya Hubner Cassady10/29/20202:17 PM   Covered Alternative: Diclofenc

## 2019-04-29 ENCOUNTER — Telehealth: Payer: Self-pay | Admitting: Internal Medicine

## 2019-04-29 NOTE — Telephone Encounter (Signed)
Pt missed call, unsure who call pls return call [phone]

## 2019-04-29 NOTE — Addendum Note (Signed)
Addended by: Hulan Fray on: 04/29/2019 02:02 PM   Modules accepted: Orders

## 2019-05-12 ENCOUNTER — Ambulatory Visit (INDEPENDENT_AMBULATORY_CARE_PROVIDER_SITE_OTHER): Payer: 59 | Admitting: Internal Medicine

## 2019-05-12 ENCOUNTER — Other Ambulatory Visit: Payer: Self-pay

## 2019-05-12 DIAGNOSIS — R5381 Other malaise: Secondary | ICD-10-CM | POA: Diagnosis not present

## 2019-05-12 DIAGNOSIS — Z79899 Other long term (current) drug therapy: Secondary | ICD-10-CM

## 2019-05-12 DIAGNOSIS — R5383 Other fatigue: Secondary | ICD-10-CM | POA: Diagnosis not present

## 2019-05-12 DIAGNOSIS — H9202 Otalgia, left ear: Secondary | ICD-10-CM | POA: Diagnosis not present

## 2019-05-12 DIAGNOSIS — R519 Headache, unspecified: Secondary | ICD-10-CM

## 2019-05-12 DIAGNOSIS — R509 Fever, unspecified: Secondary | ICD-10-CM | POA: Diagnosis not present

## 2019-05-12 DIAGNOSIS — J3489 Other specified disorders of nose and nasal sinuses: Secondary | ICD-10-CM

## 2019-05-12 MED ORDER — CETIRIZINE HCL 10 MG PO TABS: 10.0000 mg | ORAL_TABLET | Freq: Every day | ORAL | 2 refills | Status: DC

## 2019-05-12 MED ORDER — FLUTICASONE PROPIONATE 50 MCG/ACT NA SUSP: 2.0000 | Freq: Every day | NASAL | 2 refills | Status: DC

## 2019-05-12 NOTE — Progress Notes (Signed)
  [facility] Internal Medicine Residency Telephone Encounter Continuity Care Appointment  HPI:   This telephone encounter was created for Ms. Vanessa [name] on 05/12/2019 for the following cc of L earache, low grade fever, and general malaise at home .   Past Medical History:  Past Medical History:  Diagnosis Date  . Asthma    No PFT performed  . COPD (chronic obstructive pulmonary disease) ([place])   . Depression   . Diabetes mellitus 2002  . GERD (gastroesophageal reflux disease)   . Helicobacter pylori (H. pylori) infection    s/p triple therapy  . Hepatic hemangioma   . Hyperlipidemia   . Hypertriglyceridemia   . Panic attacks    mostly Agaraphobia      ROS:  Review of Systems  Constitutional: Positive for chills, fever and malaise/fatigue.  HENT: Positive for ear pain and sinus pain. Negative for sore throat.   Respiratory: Negative for cough and shortness of breath.   Cardiovascular: Negative for chest pain.  Gastrointestinal: Negative for nausea and vomiting.  Neurological: Positive for headaches.     Assessment / Plan / Recommendations:   Please see A&P under problem oriented charting for assessment of the patient's acute and chronic medical conditions.   As always, pt is advised that if symptoms worsen or new symptoms arise, they should go to an urgent care facility or to to ER for further evaluation.   Consent and Medical Decision Making:   Patient seen with Dr. Rebeca Alert  This is a telephone encounter between Vanessa [name] and Ladona Horns on 05/12/2019 for left earache. The visit was conducted with the patient located at home and Ladona Horns at [facility]. The patient's identity was confirmed using their DOB and current address. The patient has consented to being evaluated through a telephone encounter and understands the associated risks (an examination cannot be done and the patient may need to come in for an appointment) / benefits (allows the patient to remain at home,  decreasing exposure to coronavirus). I personally spent 14 minutes on medical discussion.

## 2019-05-12 NOTE — Assessment & Plan Note (Signed)
Pt states she gradually began feeling worse on Friday with sneezing and malaise. She thought it was worsening of her seasonal allergies with recent weather changes and worsening sneezing. When she woke up on Saturday morning, her L ear was aching and her L eye was crusted shut. She endorses worsening headache and sinus pressure on the L side. Temps at home have been around 99.4 - 99.6. Has been taking benadryl for her allergies and alternating between tylenol and ibuprofen for the headache. Endorses some L sided sinus pressure when bending over. Denies runny nose, congestion, post-nasal drip, cough, sore throat, difficulty breathing, or chest pain. Denies sick contacts or exposures to COVID or the flu. She has not had any trouble swallowing and is able to tolerate PO intake.   Assessment - eustachian tube dysfunction secondary to allergic rhinitis vs viral URI Reassured that these symptoms have only persisted for 3 days, and pt states this feels like how her allergies worsen during the spring and fall. It does not appear to be bacterial at this time with short course and no fever, though pt is at high risk for a secondary bacterial process due to her underlying lung disease. Instructed pt to call to schedule follow-up if symptoms worsen or fail to improve, at that time she may benefit from COVID-19 testing and formal HEENT examination.  Symptomatic treatment with flonase 2 sprays per nose daily, cetirizine 10mg  daily, and continued hydration.

## 2019-05-13 NOTE — Progress Notes (Signed)
Internal Medicine Clinic Attending  I personally confirmed the key portions of the history documented by Dr. Ronnald Ramp and I reviewed pertinent patient test results.  The assessment, diagnosis, and plan were formulated together and I agree with the documentation in the resident's note.  Lenice Pressman, M.D., Ph.D.

## 2019-06-02 ENCOUNTER — Other Ambulatory Visit: Payer: Self-pay | Admitting: Internal Medicine

## 2019-07-02 ENCOUNTER — Other Ambulatory Visit: Payer: Self-pay | Admitting: Internal Medicine

## 2019-07-02 MED ORDER — SPIRIVA HANDIHALER 18 MCG IN CAPS: 18.0000 ug | ORAL_CAPSULE | Freq: Every day | RESPIRATORY_TRACT | 2 refills | Status: DC

## 2019-07-24 ENCOUNTER — Encounter: Payer: 59 | Admitting: Internal Medicine

## 2019-08-21 ENCOUNTER — Other Ambulatory Visit: Payer: Self-pay

## 2019-08-21 ENCOUNTER — Telehealth: Payer: Self-pay | Admitting: *Deleted

## 2019-08-21 ENCOUNTER — Encounter: Payer: 59 | Admitting: Internal Medicine

## 2019-08-21 NOTE — Telephone Encounter (Signed)
Pt scheduled for telehealth but Dr Ronnald Ramp prefers to see pt in person to check A1C and labs. Called pt - explained requested in-person appt rather than telehealth; pt agreeable. Appt scheduled March 2 @ 1515 PM.

## 2019-08-26 ENCOUNTER — Encounter: Payer: 59 | Admitting: Internal Medicine

## 2019-08-26 ENCOUNTER — Telehealth: Payer: Self-pay | Admitting: *Deleted

## 2019-08-26 NOTE — Telephone Encounter (Signed)
Pt did not show to her appt today. Called pt - stated she thought it was tomorrow. In-person (she needs labs) appt re-scheduled for March 30 @ 1515PM.

## 2019-09-23 ENCOUNTER — Encounter: Payer: 59 | Admitting: Internal Medicine

## 2019-10-06 ENCOUNTER — Other Ambulatory Visit: Payer: Self-pay | Admitting: Internal Medicine

## 2019-10-14 ENCOUNTER — Encounter: Payer: 59 | Admitting: Internal Medicine

## 2019-10-14 ENCOUNTER — Ambulatory Visit: Payer: 59 | Admitting: Internal Medicine

## 2019-10-14 VITALS — BP 144/67 | HR 103 | Temp 98.1°F | Ht 62.0 in | Wt 152.5 lb

## 2019-10-14 DIAGNOSIS — F329 Major depressive disorder, single episode, unspecified: Secondary | ICD-10-CM

## 2019-10-14 DIAGNOSIS — D751 Secondary polycythemia: Secondary | ICD-10-CM | POA: Diagnosis not present

## 2019-10-14 DIAGNOSIS — F419 Anxiety disorder, unspecified: Secondary | ICD-10-CM

## 2019-10-14 DIAGNOSIS — J449 Chronic obstructive pulmonary disease, unspecified: Secondary | ICD-10-CM

## 2019-10-14 DIAGNOSIS — E119 Type 2 diabetes mellitus without complications: Secondary | ICD-10-CM

## 2019-10-14 DIAGNOSIS — F32A Depression, unspecified: Secondary | ICD-10-CM

## 2019-10-14 DIAGNOSIS — Z794 Long term (current) use of insulin: Secondary | ICD-10-CM

## 2019-10-14 LAB — GLUCOSE, CAPILLARY: Glucose-Capillary: 169 mg/dL — ABNORMAL HIGH (ref 70–99)

## 2019-10-14 LAB — POCT GLYCOSYLATED HEMOGLOBIN (HGB A1C): Hemoglobin A1C: 10.6 % — AB (ref 4.0–5.6)

## 2019-10-14 MED ORDER — ALPRAZOLAM 0.5 MG PO TABS: ORAL_TABLET | ORAL | 0 refills | Status: DC

## 2019-10-14 MED ORDER — HYDROXYZINE PAMOATE 100 MG PO CAPS: 100.0000 mg | ORAL_CAPSULE | Freq: Three times a day (TID) | ORAL | 0 refills | Status: DC | PRN

## 2019-10-14 MED ORDER — SOLIQUA 100-33 UNT-MCG/ML ~~LOC~~ SOPN: 19.0000 [IU] | PEN_INJECTOR | Freq: Every morning | SUBCUTANEOUS | 2 refills | Status: DC

## 2019-10-14 NOTE — Patient Instructions (Addendum)
Ms. Marze,  It was nice seeing you! Today we discussed...  Polycythemia vera - We have taken some additional blood work today to look for the cause of your polycythemia vera. A referral to hematology (the blood specialty doctors) has been placed. Their office will call you to schedule an appointment.   Diabetes - Your A1c today remains high at 10.6. Please increase your insulin to 19units daily. Continue checking your blood sugar at home. We can schedule a telehealth visit with me on Thursday 4/29 to go over the blood sugars and make any necessary medication changes.  Anxiety - Please being taking Hydroxyzine three times a day as needed for anxiety. A refill for your bedtime Xanax medication has been sent to the [place].   COPD - Please notify the office of the insurance coverage changes to your inhalers. You may call or drop off the letter you received to our office, so new inhalers can be prescribed to you.  Please follow-up with me in 1 month to check the blood pressure and follow-up the diabetes.  Thank you for letting me be a part of your care!

## 2019-10-14 NOTE — Progress Notes (Signed)
   CC: anxiety and diabetes follow-up  HPI:  [name] is a 59 y.o. F with significant PMH as outlined below, who presents for follow-up of her chronic medical conditions. Please see problem-based charting for additional information.  Past Medical History:  Diagnosis Date  . Asthma    No PFT performed  . COPD (chronic obstructive pulmonary disease) ([place])   . Depression   . Diabetes mellitus 2002  . GERD (gastroesophageal reflux disease)   . Helicobacter pylori (H. pylori) infection    s/p triple therapy  . Hepatic hemangioma   . Hyperlipidemia   . Hypertriglyceridemia   . Panic attacks    mostly Agaraphobia   Review of Systems:  . Review of Systems  Constitutional: Negative for chills and fever.  Eyes: Negative for blurred vision, double vision and photophobia.  Respiratory: Positive for sputum production (chronic). Negative for cough and shortness of breath.   Cardiovascular: Negative for chest pain and palpitations.  Gastrointestinal: Negative for abdominal pain, nausea and vomiting.  Genitourinary: Negative for frequency.  Neurological: Negative for weakness and headaches.  Endo/Heme/Allergies: Negative for polydipsia.  Psychiatric/Behavioral: Positive for depression. The patient is nervous/anxious.    Physical Exam:  Vitals:   10/14/19 1459  BP: (!) 144/67  Pulse: (!) 103  Temp: 98.1 F (36.7 C)  TempSrc: Oral  SpO2: 92%  Weight: 152 lb 8 oz (69.2 kg)  Height: 5\' 2"  (1.575 m)   Physical Exam Vitals and nursing note reviewed.  Constitutional:      General: She is not in acute distress.    Appearance: Normal appearance. She is not ill-appearing.  Cardiovascular:     Rate and Rhythm: Normal rate and regular rhythm.     Heart sounds: Normal heart sounds.  Pulmonary:     Effort: Pulmonary effort is normal. No respiratory distress.     Breath sounds: No rhonchi or rales.     Comments: Scattered end-expiratory wheezing throughout. Skin:    General: Skin  is warm and dry.  Neurological:     Mental Status: She is alert.    Assessment & Plan:   See Encounters Tab for problem based charting.  Patient discussed with Dr. Lynnae January

## 2019-10-15 ENCOUNTER — Encounter: Payer: Self-pay | Admitting: *Deleted

## 2019-10-15 ENCOUNTER — Telehealth: Payer: Self-pay | Admitting: Hematology and Oncology

## 2019-10-15 LAB — CBC WITH DIFFERENTIAL/PLATELET
Basophils Absolute: 0.2 10*3/uL (ref 0.0–0.2)
Basos: 1 %
EOS (ABSOLUTE): 0.5 10*3/uL — ABNORMAL HIGH (ref 0.0–0.4)
Eos: 4 %
Hematocrit: 51.3 % — ABNORMAL HIGH (ref 34.0–46.6)
Hemoglobin: 18.1 g/dL — ABNORMAL HIGH (ref 11.1–15.9)
Immature Grans (Abs): 0 10*3/uL (ref 0.0–0.1)
Immature Granulocytes: 0 %
Lymphocytes Absolute: 3.7 10*3/uL — ABNORMAL HIGH (ref 0.7–3.1)
Lymphs: 27 %
MCH: 30.2 pg (ref 26.6–33.0)
MCHC: 35.3 g/dL (ref 31.5–35.7)
MCV: 86 fL (ref 79–97)
Monocytes Absolute: 0.7 10*3/uL (ref 0.1–0.9)
Monocytes: 5 %
Neutrophils Absolute: 8.4 10*3/uL — ABNORMAL HIGH (ref 1.4–7.0)
Neutrophils: 63 %
Platelets: 508 10*3/uL — ABNORMAL HIGH (ref 150–450)
RBC: 5.99 x10E6/uL — ABNORMAL HIGH (ref 3.77–5.28)
RDW: 12.4 % (ref 11.7–15.4)
WBC: 13.6 10*3/uL — ABNORMAL HIGH (ref 3.4–10.8)

## 2019-10-15 LAB — MICROALBUMIN / CREATININE URINE RATIO
Creatinine, Urine: 35.4 mg/dL
Microalb/Creat Ratio: 357 mg/g creat — ABNORMAL HIGH (ref 0–29)
Microalbumin, Urine: 126.4 ug/mL

## 2019-10-15 LAB — ERYTHROPOIETIN: Erythropoietin: 9.8 m[IU]/mL (ref 2.6–18.5)

## 2019-10-15 NOTE — Telephone Encounter (Signed)
Received a new hem referral from Dr. Lynnae January for dx of polycythemia. Pt has been cld and scheduled to see Dr. Alvy Bimler on 4/23 at 1045am. Pt aware to arrive 15 minutes early.

## 2019-10-16 ENCOUNTER — Encounter: Payer: 59 | Admitting: Internal Medicine

## 2019-10-17 ENCOUNTER — Other Ambulatory Visit: Payer: Self-pay

## 2019-10-17 ENCOUNTER — Inpatient Hospital Stay: Payer: 59 | Attending: Hematology and Oncology | Admitting: Hematology and Oncology

## 2019-10-17 ENCOUNTER — Encounter: Payer: Self-pay | Admitting: Hematology and Oncology

## 2019-10-17 ENCOUNTER — Telehealth: Payer: Self-pay | Admitting: Hematology and Oncology

## 2019-10-17 ENCOUNTER — Inpatient Hospital Stay: Payer: 59

## 2019-10-17 ENCOUNTER — Other Ambulatory Visit: Payer: Self-pay | Admitting: Hematology and Oncology

## 2019-10-17 VITALS — BP 134/73 | HR 101 | Temp 98.9°F | Resp 18

## 2019-10-17 DIAGNOSIS — Z8041 Family history of malignant neoplasm of ovary: Secondary | ICD-10-CM | POA: Diagnosis not present

## 2019-10-17 DIAGNOSIS — D751 Secondary polycythemia: Secondary | ICD-10-CM

## 2019-10-17 DIAGNOSIS — F17210 Nicotine dependence, cigarettes, uncomplicated: Secondary | ICD-10-CM | POA: Diagnosis present

## 2019-10-17 DIAGNOSIS — J449 Chronic obstructive pulmonary disease, unspecified: Secondary | ICD-10-CM | POA: Diagnosis not present

## 2019-10-17 DIAGNOSIS — Z803 Family history of malignant neoplasm of breast: Secondary | ICD-10-CM

## 2019-10-17 DIAGNOSIS — F17200 Nicotine dependence, unspecified, uncomplicated: Secondary | ICD-10-CM

## 2019-10-17 LAB — JAK2 GENOTYPR

## 2019-10-17 NOTE — Assessment & Plan Note (Addendum)
There is no doubt in my mind, her polycythemia is secondary to smoking and COPD Serum erythropoietin level has been drawn by her primary care doctor and it was within normal limits JAK2 mutation is negative I recommend 1 unit of blood to be removed today and I plan to see her again in about 3 weeks to repeat her labs and to determine whether she needs further phlebotomy I spent a lot of time discussing with the patient and her husband the importance of nicotine cessation

## 2019-10-17 NOTE — Assessment & Plan Note (Signed)
She has borderline low oxygen saturation secondary to smoking and COPD I recommend 1 unit of blood to be removed but I have to be careful about how much phlebotomy session she can tolerate in the future because secondary polycythemia is her compensatory mechanism for hypoxia secondary to smoking

## 2019-10-17 NOTE — Assessment & Plan Note (Signed)
She has significant smoking history I discussed the importance of smoking cessation with the patient and she appears somewhat upset/angry We discussed some common strategies used I will see her again in a few weeks for further follow-up

## 2019-10-17 NOTE — Telephone Encounter (Signed)
Scheduled appt per 4/23 shc message- pt to get an updated schedule when she comes to tx today

## 2019-10-17 NOTE — Progress Notes (Signed)
[facility] NOTE  Patient Care Team: Ladona Horns, MD as PCP - General   ASSESSMENT & PLAN Polycythemia, secondary There is no doubt in my mind, her polycythemia is secondary to smoking and COPD Serum erythropoietin level has been drawn by her primary care doctor and it was within normal limits JAK2 mutation is negative I recommend 1 unit of blood to be removed today and I plan to see her again in about 3 weeks to repeat her labs and to determine whether she needs further phlebotomy I spent a lot of time discussing with the patient and her husband the importance of nicotine cessation  Tobacco dependence She has significant smoking history I discussed the importance of smoking cessation with the patient and she appears somewhat upset/angry We discussed some common strategies used I will see her again in a few weeks for further follow-up  COPD (chronic obstructive pulmonary disease) ([place]) She has borderline low oxygen saturation secondary to smoking and COPD I recommend 1 unit of blood to be removed but I have to be careful about how much phlebotomy session she can tolerate in the future because secondary polycythemia is her compensatory mechanism for hypoxia secondary to smoking   The total time spent in the appointment was 55 minutes encounter with patients including review of chart and various tests results, discussions about plan of care and coordination of care plan   All questions were answered. The patient knows to call the clinic with any problems, questions or concerns. No barriers to learning was detected.  Heath Lark, MD 10/17/2019 2:25 PM  CHIEF COMPLAINTS/PURPOSE OF CONSULTATION:  Erythrocytosis  HISTORY OF PRESENTING ILLNESS:  Vanessa [name] 59 y.o. female is here because of elevated hemoglobin. Her husband, [name] is available with her at the appointment today  She was found to have abnormal CBC from recent visit to her primary care doctor's  office On review of her electronic records from 2007 until her most recent blood work from October 14, 2019, hemoglobin fluctuated from 14.7 to as high as 18.1  She denies intermittent headaches, frequent leg cramps and occasional chest pain.  She has shortness of breath and chronic fatigue secondary to COPD She has been diagnosed with COPD for at least 5 years but does not need oxygen She has been smoking since the age of 42, currently at 2 packs of cigarettes per day Her husband used to smoke but quit many years ago when he was diagnosed with a heart attack According to her husband, the patient is reluctant to quit because this is her coping strategy  She never suffer from diagnosis of blood clot.  There is no prior diagnosis of obstructive sleep apnea.   MEDICAL HISTORY:  Past Medical History:  Diagnosis Date  . Asthma    No PFT performed  . COPD (chronic obstructive pulmonary disease) ([place])   . Depression   . Diabetes mellitus 2002  . GERD (gastroesophageal reflux disease)   . Helicobacter pylori (H. pylori) infection    s/p triple therapy  . Hepatic hemangioma   . Hyperlipidemia   . Hypertriglyceridemia   . Panic attacks    mostly Agaraphobia    SURGICAL HISTORY: Past Surgical History:  Procedure Laterality Date  . CESAREAN SECTION     with 3 children  . OVARIAN CYST REMOVAL    . TUBAL LIGATION      SOCIAL HISTORY: Social History   Socioeconomic History  . Marital status: Married    Spouse  name: Not on file  . Number of children: Not on file  . Years of education: Not on file  . Highest education level: Not on file  Occupational History  . Not on file  Tobacco Use  . Smoking status: Current Every Day Smoker    Packs/day: 2.00    Years: 40.00    Pack years: 80.00    Types: Cigarettes  . Smokeless tobacco: Never Used  Substance and Sexual Activity  . Alcohol use: No    Alcohol/week: 0.0 standard drinks    Comment: none  . Drug use: No  . Sexual  activity: Not Currently  Other Topics Concern  . Not on file  Social History Narrative   Drinks at least a pot of coffee every day.    Social Determinants of Health   Financial Resource Strain:   . Difficulty of Paying Living Expenses:   Food Insecurity:   . Worried About Charity fundraiser in the Last Year:   . Arboriculturist in the Last Year:   Transportation Needs:   . Film/video editor (Medical):   Marland Kitchen Lack of Transportation (Non-Medical):   Physical Activity:   . Days of Exercise per Week:   . Minutes of Exercise per Session:   Stress:   . Feeling of Stress :   Social Connections:   . Frequency of Communication with Friends and Family:   . Frequency of Social Gatherings with Friends and Family:   . Attends Religious Services:   . Active Member of Clubs or Organizations:   . Attends Archivist Meetings:   Marland Kitchen Marital Status:   Intimate Partner Violence:   . Fear of Current or Ex-Partner:   . Emotionally Abused:   Marland Kitchen Physically Abused:   . Sexually Abused:     FAMILY HISTORY: Family History  Problem Relation Age of Onset  . Colon polyps Mother        benign  . Ovarian cancer Mother   . Breast cancer Maternal Aunt   . Diabetes Maternal Uncle   . Ovarian cancer Maternal Aunt   . Colon cancer Neg Hx   . Stomach cancer Neg Hx     ALLERGIES:  is allergic to jardiance [empagliflozin] and avandia [rosiglitazone].  MEDICATIONS:  Current Outpatient Medications  Medication Sig Dispense Refill  . acetaminophen (TYLENOL) 500 MG tablet Take 1,000 mg by mouth every 6 (six) hours as needed for mild pain.    Marland Kitchen ADVAIR DISKUS 250-50 MCG/DOSE AEPB INHALE ONE DOSE BY MOUTH TWICE DAILY. 180 each 2  . ALPRAZolam (XANAX) 0.5 MG tablet TAKE 1 TABLET BY MOUTH AT BEDTIME AS NEEDED FOR ANXIETY 30 tablet 0  . aspirin 81 MG tablet Take 1 tablet (81 mg total) by mouth daily. 30 tablet 2  . atorvastatin (LIPITOR) 20 MG tablet Take 1 tablet (20 mg total) by mouth daily. 30  tablet 2  . Blood Glucose Monitoring Suppl (TRUE METRIX METER) DEVI 1 each by Does not apply route 3 (three) times daily before meals. 1 Device 0  . cetirizine (ZYRTEC) 10 MG tablet Take 1 tablet (10 mg total) by mouth daily. 30 tablet 2  . citalopram (CELEXA) 40 MG tablet Take 1 tablet (40 mg total) by mouth daily. 90 tablet 0  . dicyclomine (BENTYL) 20 MG tablet Take 1 tablet (20 mg total) by mouth 3 (three) times daily before meals. 90 tablet 3  . diphenhydrAMINE (BENADRYL) 25 mg capsule Take 50 mg by mouth every  6 (six) hours as needed for allergies.    . DULoxetine (CYMBALTA) 20 MG capsule Take 1 capsule (20 mg total) by mouth daily. 30 capsule 2  . fluticasone (FLONASE) 50 MCG/ACT nasal spray Place 2 sprays into both nostrils daily. 15.8 mL 2  . gabapentin (NEURONTIN) 300 MG capsule Take 1 capsule (300 mg total) by mouth at bedtime. 90 capsule 2  . glucose blood test strip Use as instructed 100 each 12  . hydrOXYzine (VISTARIL) 100 MG capsule Take 1 capsule (100 mg total) by mouth 3 (three) times daily as needed for anxiety. 30 capsule 0  . ibuprofen (ADVIL,MOTRIN) 200 MG tablet Take 600 mg by mouth every 6 (six) hours as needed for moderate pain.    . Insulin Glargine-Lixisenatide (SOLIQUA) 100-33 UNT-MCG/ML SOPN Inject 19 Units into the skin every morning. 15 mL 2  . ipratropium-albuterol (DUONEB) 0.5-2.5 (3) MG/3ML SOLN Take 3 mLs by nebulization every 6 (six) hours. 360 mL 3  . lisinopril (PRINIVIL,ZESTRIL) 5 MG tablet Take 1 tablet (5 mg total) by mouth daily. 90 tablet 1  . niacin (NIASPAN) 500 MG CR tablet Take 1 tablet (500 mg total) by mouth 2 (two) times daily. 180 tablet 3  . omeprazole (PRILOSEC) 20 MG capsule Take 1 capsule (20 mg total) by mouth daily. 30 capsule 2  . Simethicone (GAS-X EXTRA STRENGTH PO) Take 1 tablet by mouth 3 (three) times daily as needed (gas).    Marland Kitchen tiotropium (SPIRIVA HANDIHALER) 18 MCG inhalation capsule Place 1 capsule (18 mcg total) into inhaler and  inhale daily. 90 capsule 2  . VENTOLIN HFA 108 (90 Base) MCG/ACT inhaler INHALE 1 TO 2 PUFFS BY MOUTH EVERY 6 HOURS AS NEEDED FOR WHEEZING 18 g 0   No current facility-administered medications for this visit.    REVIEW OF SYSTEMS:   Constitutional: Denies fevers, chills or abnormal night sweats Eyes: Denies blurriness of vision, double vision or watery eyes Ears, nose, mouth, throat, and face: Denies mucositis or sore throat Cardiovascular: Denies palpitation, chest discomfort or lower extremity swelling Gastrointestinal:  Denies nausea, heartburn or change in bowel habits.  Skin: Denies abnormal skin rashes Lymphatics: Denies new lymphadenopathy or easy bruising Neurological:Denies numbness, tingling or new weaknesses Behavioral/Psych: Mood is stable, no new changes  All other systems were reviewed with the patient and are negative.  PHYSICAL EXAMINATION: ECOG PERFORMANCE STATUS: 1 - Symptomatic but completely ambulatory  Vitals:   10/17/19 1051  BP: 138/65  Pulse: (!) 101  Resp: 18  Temp: 98.9 F (37.2 C)  SpO2: 92%   Filed Weights   10/17/19 1051  Weight: 151 lb 9.6 oz (68.8 kg)    GENERAL:alert, no distress and comfortable SKIN: skin color, texture, turgor are normal, no rashes or significant lesions EYES: normal, conjunctiva are pink and non-injected, sclera clear OROPHARYNX:no exudate, no erythema and lips, buccal mucosa, and tongue normal  NECK: supple, thyroid normal size, non-tender, without nodularity LYMPH:  no palpable lymphadenopathy in the cervical, axillary or inguinal LUNGS: clear to auscultation and percussion with normal breathing effort HEART: regular rate & rhythm and no murmurs and no lower extremity edema ABDOMEN:abdomen soft, non-tender and normal bowel sounds.  Noted increased adiposity in the central part of her abdomen.  No splenomegaly Musculoskeletal:no cyanosis of digits and no clubbing  PSYCH: alert & oriented x 3 with fluent speech NEURO:  no focal motor/sensory deficits  LABORATORY DATA:  I have reviewed the data as listed Recent Results (from the past 2160 hour(s))  Microalbumin / Creatinine Urine Ratio     Status: Abnormal   Collection Time: 10/14/19  3:45 PM  Result Value Ref Range   Creatinine, Urine 35.4 Not Estab. mg/dL   Microalbumin, Urine 126.4 Not Estab. ug/mL   Microalb/Creat Ratio 357 (H) 0 - 29 mg/g creat    Comment:                        Normal:                0 -  29                        Moderately increased: 30 - 300                        Severely increased:       >300   JAK2 genotypr     Status: None   Collection Time: 10/14/19  3:58 PM  Result Value Ref Range   JAK2 GenotypR Comment     Comment: Result: NEGATIVE for the JAK2 V617F mutation. Interpretation:  The G to T nucleotide change encoding the V617F mutation was not detected.  This result does not rule out the presence of the JAK2 mutation at a level below the sensitivity of detection of this assay, or the presence of other mutations within JAK2 not detected by this assay.  This result does not rule out a diagnosis of polycythemia vera, essential thrombocythemia or idiopathic myelofibrosis as the V617F mutation is not detected in all patients with these disorders.    Background Comment     Comment: JAK2 is a cytoplasmic tyrosine kinase with a key role in signal transduction from multiple hematopoietic growth factor receptors. A point mutation within exon 14 of the JAK2 gene [id number]) encoding a valine to phenylalanine substitution at position 617 of the JAK2 protein (V617F) has been identified in most patients with polycythemia vera, and in about half of those with either essential thrombocythemia or idiopathic myelofibrosis. The V617F has also been detected, although infrequently, in other myeloid disorders such as chronic myelomonocytic leukemia and chronic neutrophilic luekemia. V617F is an acquired mutation that alters a highly  conserved valine present in the negative regulatory JH2 domain of the JAK2 protein and is predicted to dysregulate kinase activity. Methodology: Total genomic DNA was extracted and subjected to TaqMan real-time PCR amplification/detection. Two amplification products per sample were monitored by real-time PCR using primers/probes specific  to JAK2 wild type (WT) and JAK2 mutant V617F. The ABI7900 Absolute Quantitation software will compare the patient specimen valuse to the standard curves and generate percent values for wild type and mutant type. In vitro studies have indicated that this assay has an analytical sensitivity of 1%. References: Baxter EJ, Scott Phineas Real, et al. Acquired mutation of the tyrosine kinase JAK2 in human myeloproliferative disorders. Lancet. 2005 Mar 19-25; 365(9464):1054-1061. Alfonso Ramus Couedic JP. A unique clonal JAK2 mutation leading to constitutive signaling causes polycythaemia vera. Nature. 2005 Apr 28; 434(7037):1144-1148. Kralovics R, Passamonti F, Buser AS, et al. A gain-of-function mutation of JAK2 in myeloproliferative disorders. N Engl J Med. 2005 Apr 28; 352(17):1779-1790.    Director Review, JAK2 Comment     Comment: Loni Muse, PhD, [facility]     Director, [facility]     Research [place], [place] [address]  [phone] This test was developed and its performance characteristics determined by [facility]. It has not been cleared or approved by the Food and Drug Administration.   CBC with Diff     Status: Abnormal   Collection Time: 10/14/19  3:58 PM  Result Value Ref Range   WBC 13.6 (H) 3.4 - 10.8 x10E3/uL   RBC 5.99 (H) 3.77 - 5.28 x10E6/uL   Hemoglobin 18.1 (H) 11.1 - 15.9 g/dL   Hematocrit 51.3 (H) 34.0 - 46.6 %   MCV 86 79 - 97 fL   MCH 30.2 26.6 - 33.0 pg   MCHC 35.3 31.5 - 35.7 g/dL   RDW 12.4 11.7 - 15.4 %   Platelets 508 (H) 150 - 450 x10E3/uL    Neutrophils 63 Not Estab. %   Lymphs 27 Not Estab. %   Monocytes 5 Not Estab. %   Eos 4 Not Estab. %   Basos 1 Not Estab. %   Neutrophils Absolute 8.4 (H) 1.4 - 7.0 x10E3/uL   Lymphocytes Absolute 3.7 (H) 0.7 - 3.1 x10E3/uL   Monocytes Absolute 0.7 0.1 - 0.9 x10E3/uL   EOS (ABSOLUTE) 0.5 (H) 0.0 - 0.4 x10E3/uL   Basophils Absolute 0.2 0.0 - 0.2 x10E3/uL   Immature Granulocytes 0 Not Estab. %   Immature Grans (Abs) 0.0 0.0 - 0.1 x10E3/uL  Erythropoietin     Status: None   Collection Time: 10/14/19  3:58 PM  Result Value Ref Range   Erythropoietin 9.8 2.6 - 18.5 mIU/mL    Comment: [employer] Coulter UniCel DxI 800 Immunoassay System Values obtained with different assay methods or kits cannot be used interchangeably. Results cannot be interpreted as absolute evidence of the presence or absence of malignant disease.   Glucose, capillary     Status: Abnormal   Collection Time: 10/14/19  4:00 PM  Result Value Ref Range   Glucose-Capillary 169 (H) 70 - 99 mg/dL    Comment: Glucose reference range applies only to samples taken after fasting for at least 8 hours.  POC Hbg A1C     Status: Abnormal   Collection Time: 10/14/19  4:09 PM  Result Value Ref Range   Hemoglobin A1C 10.6 (A) 4.0 - 5.6 %   HbA1c POC (<> result, manual entry)     HbA1c, POC (prediabetic range)     HbA1c, POC (controlled diabetic range)

## 2019-10-17 NOTE — Progress Notes (Signed)
Vanessa [name] presents today for phlebotomy per MD orders. 16g phlebotomy kit needle inserted into LAC. Phlebotomy procedure started at 1437 and ended at 1453. 451 grams removed. Food and beverage offered to patient, beverage accepted. Patient observed for 30 minutes after procedure without any incident. Patient tolerated procedure well. IV needle removed intact.

## 2019-10-17 NOTE — Patient Instructions (Signed)

## 2019-10-31 NOTE — Assessment & Plan Note (Addendum)
Pt continuing to have anxiety and difficulty sleeping at home. Currently taking Celexa 40mg  daily. Pt requests increase in her Xanax frequency at home to more than once a day. She feels overwhelmed as she cares for both her spouse and mother. Describes episodes of being on edge and occasional anger outbursts. Previously referred to psychiatry but never saw anyone. She is not interested in counseling or psychiatry services at this time, stating "I don't need that". Per PDMP review, pt's last 30 pills of Xanax has lasted her 10 months. However, pt states she takes it every day and ran out last week.   - will refill low-dose xanax for 30 pills to take at bedtime - add PRN hydroxyzine to take during the day - continue to reassess for possible psych referral

## 2019-10-31 NOTE — Assessment & Plan Note (Signed)
Pt endorses taking Soliqua 15 units daily at home. Endorses checking CBG at home, but does not have her meter with her and cannot recall values. A1c today remains uncontrolled (A1c 9.7 >>> 10.6). Unable to tolerate metformin due to GI effects. Previously on Invokamet, but stopped after some weight loss and desire to try lifestyle modifications. Discussed that sugars continue to be uncontrolled and emphasized importance for close follow-up for medication titration.   - increase Soliqua to 19 units daily - telehealth follow-up in 1 week to go over CBG readings - obtain urine microalbumin/Cr ratio - could consider initiation of SGLT-2 again given pt tolerated it before  - in person follow-up in 1 month

## 2019-10-31 NOTE — Assessment & Plan Note (Addendum)
Pt with history of polycythemia vera with borderline Hgb values that had previously been attributed to her COPD. She has never had a formal hematologic evaluation. Denies chest pain, leg cramps, or headaches. No history of blood clots. Has dyspnea due to COPD but not acute changes or worsening. Pt feels well and at her baseline today. Repeat CBC this visit with Hgb 18.1, WBC 13.6, and plts 508.  - ordered for Epo and JAK genotype - will refer to hematology  CBC Latest Ref Rng & Units 10/14/2019 04/10/2019 03/10/2018  WBC 3.4 - 10.8 x10E3/uL 13.6(H) 12.1(H) 12.4(H)  Hemoglobin 11.1 - 15.9 g/dL 18.1(H) 17.3(H) 14.7  Hematocrit 34.0 - 46.6 % 51.3(H) 50.6(H) 43.7  Platelets 150 - 450 x10E3/uL 508(H) 427 403(H)

## 2019-10-31 NOTE — Assessment & Plan Note (Addendum)
Pt taking Advair and Spiriva inhalers daily. Has not needed rescue inhaler in last 3 months. No changes in exertional capacity. Denies fevers, chills, or cough. Endorses some sputum production. She continues to smoke approximately 1.5-2 packs per day. Is not interested in quitting at this time. Pt states she received a letter from her insurance stating her inhaler coverage was changing. Cannot recall the alternative inhalers her insurance recommended, so she will call the office or drop off the letter so we can update her inhalers to cheaper options for her. She has never had formal PFT testing due to uninsured status previously. Pt wishes to defer at this time due to another referral to hematology for her polycythemia. Lung examination significant for scattered end-expiratory wheezing. O2 saturation 92% on room air today.  - continue current inhalers - switch inhalers in the future if necessary based on insurance coverage - PFTs at next visit

## 2019-11-03 NOTE — Progress Notes (Signed)
Internal Medicine Clinic Attending  Case discussed with Dr. Ronnald Ramp at the time of the visit.  We reviewed the resident's history and exam and pertinent patient test results.  I agree with the assessment, diagnosis, and plan of care documented in the resident's note.  Of note, pt does not have PV as JAK negative and EPO nl.

## 2019-11-05 ENCOUNTER — Other Ambulatory Visit: Payer: Self-pay | Admitting: Internal Medicine

## 2019-11-05 DIAGNOSIS — F419 Anxiety disorder, unspecified: Secondary | ICD-10-CM

## 2019-11-07 ENCOUNTER — Telehealth: Payer: Self-pay

## 2019-11-07 ENCOUNTER — Inpatient Hospital Stay: Payer: 59 | Admitting: Hematology and Oncology

## 2019-11-07 ENCOUNTER — Inpatient Hospital Stay: Payer: 59

## 2019-11-07 NOTE — Telephone Encounter (Signed)
Called regarding appts today. She overslept and will not be coming to appts today. Scheduling message sent to reschedule.

## 2019-11-10 ENCOUNTER — Telehealth: Payer: Self-pay | Admitting: Hematology and Oncology

## 2019-11-10 NOTE — Telephone Encounter (Signed)
Scheduled appt per 5/14 sch message - pt is aware of appt date and time   

## 2019-11-14 ENCOUNTER — Inpatient Hospital Stay: Payer: 59 | Attending: Hematology and Oncology

## 2019-11-14 ENCOUNTER — Inpatient Hospital Stay: Payer: 59

## 2019-11-14 ENCOUNTER — Encounter: Payer: Self-pay | Admitting: Hematology and Oncology

## 2019-11-14 ENCOUNTER — Other Ambulatory Visit: Payer: Self-pay

## 2019-11-14 ENCOUNTER — Inpatient Hospital Stay: Payer: 59 | Admitting: Hematology and Oncology

## 2019-11-14 VITALS — BP 132/61 | HR 80

## 2019-11-14 DIAGNOSIS — J449 Chronic obstructive pulmonary disease, unspecified: Secondary | ICD-10-CM | POA: Insufficient documentation

## 2019-11-14 DIAGNOSIS — D751 Secondary polycythemia: Secondary | ICD-10-CM

## 2019-11-14 DIAGNOSIS — F17210 Nicotine dependence, cigarettes, uncomplicated: Secondary | ICD-10-CM | POA: Diagnosis present

## 2019-11-14 DIAGNOSIS — F17200 Nicotine dependence, unspecified, uncomplicated: Secondary | ICD-10-CM

## 2019-11-14 LAB — CBC WITH DIFFERENTIAL/PLATELET
Abs Immature Granulocytes: 0.04 10*3/uL (ref 0.00–0.07)
Basophils Absolute: 0.1 10*3/uL (ref 0.0–0.1)
Basophils Relative: 1 %
Eosinophils Absolute: 0.5 10*3/uL (ref 0.0–0.5)
Eosinophils Relative: 5 %
HCT: 47.4 % — ABNORMAL HIGH (ref 36.0–46.0)
Hemoglobin: 15.8 g/dL — ABNORMAL HIGH (ref 12.0–15.0)
Immature Granulocytes: 0 %
Lymphocytes Relative: 31 %
Lymphs Abs: 3.7 10*3/uL (ref 0.7–4.0)
MCH: 29.4 pg (ref 26.0–34.0)
MCHC: 33.3 g/dL (ref 30.0–36.0)
MCV: 88.3 fL (ref 80.0–100.0)
Monocytes Absolute: 0.7 10*3/uL (ref 0.1–1.0)
Monocytes Relative: 6 %
Neutro Abs: 6.9 10*3/uL (ref 1.7–7.7)
Neutrophils Relative %: 57 %
Platelets: 382 10*3/uL (ref 150–400)
RBC: 5.37 MIL/uL — ABNORMAL HIGH (ref 3.87–5.11)
RDW: 12.4 % (ref 11.5–15.5)
WBC: 12 10*3/uL — ABNORMAL HIGH (ref 4.0–10.5)
nRBC: 0 % (ref 0.0–0.2)

## 2019-11-14 NOTE — Progress Notes (Signed)
Therapeutic phlebotomy performed per MD order using 16g angiocath in Vanessa [name]. Procedure initiated at 1305 and ended at 1315. 500g removed. Pt tolerated well. Beverage provided. Pt stayed 20 of 30 minutes post-procedure for observation. Vital signs stable.

## 2019-11-14 NOTE — Assessment & Plan Note (Signed)
There is no doubt in my mind, her polycythemia is secondary to smoking and COPD Serum erythropoietin level has been drawn by her primary care doctor and it was within normal limits JAK2 mutation is negative I recommend 1 unit of blood to be removed today and I plan to see her again in about 2 months repeat her labs and to determine whether she needs further phlebotomy I spent a lot of time discussing with the patient and her husband the importance of nicotine cessation

## 2019-11-14 NOTE — Patient Instructions (Signed)

## 2019-11-14 NOTE — Assessment & Plan Note (Signed)
She is attempting to quit smoking on her own

## 2019-11-14 NOTE — Progress Notes (Signed)
[place] OFFICE PROGRESS NOTE  Vanessa Horns, MD  ASSESSMENT & PLAN:  Polycythemia, secondary There is no doubt in my mind, her polycythemia is secondary to smoking and COPD Serum erythropoietin level has been drawn by her primary care doctor and it was within normal limits JAK2 mutation is negative I recommend 1 unit of blood to be removed today and I plan to see her again in about 2 months repeat her labs and to determine whether she needs further phlebotomy I spent a lot of time discussing with the patient and her husband the importance of nicotine cessation  Tobacco dependence She is attempting to quit smoking on her own   No orders of the defined types were placed in this encounter.   The total time spent in the appointment was 15 minutes encounter with patients including review of chart and various tests results, discussions about plan of care and coordination of care plan   All questions were answered. The patient knows to call the clinic with any problems, questions or concerns. No barriers to learning was detected.    Heath Lark, MD 5/21/20216:04 PM  INTERVAL HISTORY: Vanessa [name] 59 y.o. female returns for further follow-up regarding secondary polycythemia She was doing well with her smoking cessation effort but is currently undergoing a lot of stress taking care of her mother who had recent stroke She tolerated phlebotomy well Denies recent shortness of breath, headache or chest pain   SUMMARY OF HEMATOLOGIC HISTORY:  Vanessa [name] 59 y.o. female is here because of elevated hemoglobin. Her husband, [name] is available with her at the appointment today  She was found to have abnormal CBC from recent visit to her primary care doctor's office On review of her electronic records from 2007 until her most recent blood work from October 14, 2019, hemoglobin fluctuated from 14.7 to as high as 18.1  She denies intermittent headaches, frequent leg cramps and  occasional chest pain.  She has shortness of breath and chronic fatigue secondary to COPD She has been diagnosed with COPD for at least 5 years but does not need oxygen She has been smoking since the age of 69, currently at 2 packs of cigarettes per day Her husband used to smoke but quit many years ago when he was diagnosed with a heart attack According to her husband, the patient is reluctant to quit because this is her coping strategy  She never suffer from diagnosis of blood clot.  There is no prior diagnosis of obstructive sleep apnea.  She underwent extensive evaluation in April 2021 and was found to have secondary polycythemia.  JAK2 mutation/erythropoietin levels were normal She was started on phlebotomy.  I have reviewed the past medical history, past surgical history, social history and family history with the patient and they are unchanged from previous note.  ALLERGIES:  is allergic to jardiance [empagliflozin] and avandia [rosiglitazone].  MEDICATIONS:  Current Outpatient Medications  Medication Sig Dispense Refill  . acetaminophen (TYLENOL) 500 MG tablet Take 1,000 mg by mouth every 6 (six) hours as needed for mild pain.    Marland Kitchen ADVAIR DISKUS 250-50 MCG/DOSE AEPB INHALE ONE DOSE BY MOUTH TWICE DAILY. 180 each 2  . ALPRAZolam (XANAX) 0.5 MG tablet TAKE 1 TABLET BY MOUTH AT BEDTIME AS NEEDED FOR ANXIETY 30 tablet 0  . aspirin 81 MG tablet Take 1 tablet (81 mg total) by mouth daily. 30 tablet 2  . atorvastatin (LIPITOR) 20 MG tablet Take 1 tablet (20 mg  total) by mouth daily. 30 tablet 2  . Blood Glucose Monitoring Suppl (TRUE METRIX METER) DEVI 1 each by Does not apply route 3 (three) times daily before meals. 1 Device 0  . cetirizine (ZYRTEC) 10 MG tablet Take 1 tablet (10 mg total) by mouth daily. 30 tablet 2  . citalopram (CELEXA) 40 MG tablet Take 1 tablet (40 mg total) by mouth daily. 90 tablet 0  . dicyclomine (BENTYL) 20 MG tablet Take 1 tablet (20 mg total) by mouth 3  (three) times daily before meals. 90 tablet 3  . diphenhydrAMINE (BENADRYL) 25 mg capsule Take 50 mg by mouth every 6 (six) hours as needed for allergies.    . DULoxetine (CYMBALTA) 20 MG capsule Take 1 capsule (20 mg total) by mouth daily. 30 capsule 2  . fluticasone (FLONASE) 50 MCG/ACT nasal spray Place 2 sprays into both nostrils daily. 15.8 mL 2  . gabapentin (NEURONTIN) 300 MG capsule Take 1 capsule (300 mg total) by mouth at bedtime. 90 capsule 2  . glucose blood test strip Use as instructed 100 each 12  . hydrOXYzine (VISTARIL) 100 MG capsule TAKE 1 CAPSULE BY MOUTH THREE TIMES DAILY AS NEEDED FOR ANXIETY 30 capsule 1  . ibuprofen (ADVIL,MOTRIN) 200 MG tablet Take 600 mg by mouth every 6 (six) hours as needed for moderate pain.    . Insulin Glargine-Lixisenatide (SOLIQUA) 100-33 UNT-MCG/ML SOPN Inject 19 Units into the skin every morning. 15 mL 2  . ipratropium-albuterol (DUONEB) 0.5-2.5 (3) MG/3ML SOLN Take 3 mLs by nebulization every 6 (six) hours. 360 mL 3  . lisinopril (PRINIVIL,ZESTRIL) 5 MG tablet Take 1 tablet (5 mg total) by mouth daily. 90 tablet 1  . niacin (NIASPAN) 500 MG CR tablet Take 1 tablet (500 mg total) by mouth 2 (two) times daily. 180 tablet 3  . omeprazole (PRILOSEC) 20 MG capsule Take 1 capsule (20 mg total) by mouth daily. 30 capsule 2  . Simethicone (GAS-X EXTRA STRENGTH PO) Take 1 tablet by mouth 3 (three) times daily as needed (gas).    Marland Kitchen tiotropium (SPIRIVA HANDIHALER) 18 MCG inhalation capsule Place 1 capsule (18 mcg total) into inhaler and inhale daily. 90 capsule 2  . VENTOLIN HFA 108 (90 Base) MCG/ACT inhaler INHALE 1 TO 2 PUFFS BY MOUTH EVERY 6 HOURS AS NEEDED FOR WHEEZING 18 g 0   No current facility-administered medications for this visit.     REVIEW OF SYSTEMS:   Constitutional: Denies fevers, chills or night sweats Eyes: Denies blurriness of vision Ears, nose, mouth, throat, and face: Denies mucositis or sore throat Respiratory: Denies cough,  dyspnea or wheezes Cardiovascular: Denies palpitation, chest discomfort or lower extremity swelling Gastrointestinal:  Denies nausea, heartburn or change in bowel habits Skin: Denies abnormal skin rashes Lymphatics: Denies new lymphadenopathy or easy bruising Neurological:Denies numbness, tingling or new weaknesses Behavioral/Psych: Mood is stable, no new changes  All other systems were reviewed with the patient and are negative.  PHYSICAL EXAMINATION: ECOG PERFORMANCE STATUS: 0 - Asymptomatic  Vitals:   11/14/19 1233  BP: (!) 122/52  Pulse: 82  Resp: 18  Temp: 98.2 F (36.8 C)  SpO2: 93%   Filed Weights   11/14/19 1233  Weight: 150 lb 3.2 oz (68.1 kg)    GENERAL:alert, no distress and comfortable NEURO: alert & oriented x 3 with fluent speech, no focal motor/sensory deficits  LABORATORY DATA:  I have reviewed the data as listed     Component Value Date/Time   NA 134 04/10/2019  1500   K 5.1 04/10/2019 1500   CL 98 04/10/2019 1500   CO2 20 04/10/2019 1500   GLUCOSE 191 (H) 04/10/2019 1500   GLUCOSE 317 (H) 03/10/2018 0054   BUN 12 04/10/2019 1500   CREATININE 0.58 04/10/2019 1500   CREATININE 0.45 (L) 12/03/2014 1542   CALCIUM 10.0 04/10/2019 1500   PROT 6.1 (L) 03/10/2018 0139   ALBUMIN 3.5 03/10/2018 0139   AST 17 03/10/2018 0139   ALT 15 03/10/2018 0139   ALKPHOS 91 03/10/2018 0139   BILITOT 0.6 03/10/2018 0139   GFRNONAA 102 04/10/2019 1500   GFRNONAA >89 12/03/2014 1542   GFRAA 117 04/10/2019 1500   GFRAA >89 12/03/2014 1542    No results found for: SPEP, UPEP  Lab Results  Component Value Date   WBC 12.0 (H) 11/14/2019   NEUTROABS 6.9 11/14/2019   HGB 15.8 (H) 11/14/2019   HCT 47.4 (H) 11/14/2019   MCV 88.3 11/14/2019   PLT 382 11/14/2019      Chemistry      Component Value Date/Time   NA 134 04/10/2019 1500   K 5.1 04/10/2019 1500   CL 98 04/10/2019 1500   CO2 20 04/10/2019 1500   BUN 12 04/10/2019 1500   CREATININE 0.58 04/10/2019  1500   CREATININE 0.45 (L) 12/03/2014 1542      Component Value Date/Time   CALCIUM 10.0 04/10/2019 1500   ALKPHOS 91 03/10/2018 0139   AST 17 03/10/2018 0139   ALT 15 03/10/2018 0139   BILITOT 0.6 03/10/2018 0139

## 2019-11-17 ENCOUNTER — Telehealth: Payer: Self-pay | Admitting: Hematology and Oncology

## 2019-11-17 NOTE — Telephone Encounter (Signed)
Scheduled appt per 5/21 sch message - pt aware of appt date and time

## 2019-11-18 ENCOUNTER — Other Ambulatory Visit: Payer: Self-pay | Admitting: Internal Medicine

## 2019-11-18 NOTE — Telephone Encounter (Signed)
Need refill on albuterol inhaler ;pt contact [place] ([place]), [place] - [address]

## 2019-11-19 MED ORDER — VENTOLIN HFA 108 (90 BASE) MCG/ACT IN AERS: 1.0000 | INHALATION_SPRAY | Freq: Four times a day (QID) | RESPIRATORY_TRACT | 0 refills | Status: DC | PRN

## 2019-11-20 ENCOUNTER — Encounter: Payer: 59 | Admitting: Internal Medicine

## 2019-12-04 ENCOUNTER — Telehealth: Payer: Self-pay | Admitting: *Deleted

## 2019-12-04 ENCOUNTER — Encounter: Payer: 59 | Admitting: Internal Medicine

## 2019-12-04 NOTE — Telephone Encounter (Signed)
Pt did not come to her appt this afternoon w/Dr Ronnald Ramp. Called pt to re-schedule, no answer - left message to call the office.

## 2019-12-09 ENCOUNTER — Telehealth: Payer: Self-pay

## 2019-12-09 NOTE — Telephone Encounter (Signed)
-----   Message from Heath Lark, MD sent at 12/09/2019  2:57 PM EDT ----- Regarding: can she come in earlier on 7/23? If she wants to come in late I can see her late on 7/21

## 2019-12-09 NOTE — Telephone Encounter (Signed)
Called and given below message. She verbalized understanding. Moved appts to earlier time. She is aware of times. Sent scheduling message to schedule phlebotomy after am appts.

## 2019-12-10 ENCOUNTER — Telehealth: Payer: Self-pay | Admitting: Hematology and Oncology

## 2019-12-10 NOTE — Telephone Encounter (Signed)
Scheduled per 6/15 sch message. RN Hassan Rowan stated pt is aware of appt added on 7/23.

## 2019-12-23 ENCOUNTER — Telehealth: Payer: Self-pay | Admitting: *Deleted

## 2019-12-23 NOTE — Telephone Encounter (Signed)
Pharmacy would like to change ventolin to proair or proventlin. Please send changes to [facility]

## 2019-12-24 ENCOUNTER — Other Ambulatory Visit: Payer: Self-pay | Admitting: Internal Medicine

## 2019-12-24 DIAGNOSIS — J449 Chronic obstructive pulmonary disease, unspecified: Secondary | ICD-10-CM

## 2019-12-24 MED ORDER — ALBUTEROL SULFATE HFA 108 (90 BASE) MCG/ACT IN AERS: 2.0000 | INHALATION_SPRAY | Freq: Four times a day (QID) | RESPIRATORY_TRACT | 2 refills | Status: DC | PRN

## 2019-12-24 NOTE — Telephone Encounter (Signed)
Changed to ventolin, thanks.

## 2019-12-30 ENCOUNTER — Telehealth: Payer: Self-pay | Admitting: *Deleted

## 2019-12-30 NOTE — Telephone Encounter (Signed)
Pharmacy states albuterol needs to be changed to [facility] HFA due to insurance, could you please send a new script to [facility]

## 2019-12-31 MED ORDER — ALBUTEROL SULFATE HFA 108 (90 BASE) MCG/ACT IN AERS: 1.0000 | INHALATION_SPRAY | Freq: Four times a day (QID) | RESPIRATORY_TRACT | 1 refills | Status: DC | PRN

## 2019-12-31 NOTE — Telephone Encounter (Signed)
Prescription for [facility] sent to [facility].

## 2020-01-16 ENCOUNTER — Other Ambulatory Visit: Payer: 59

## 2020-01-16 ENCOUNTER — Inpatient Hospital Stay: Payer: 59 | Attending: Hematology and Oncology

## 2020-01-16 ENCOUNTER — Ambulatory Visit: Payer: 59 | Admitting: Hematology and Oncology

## 2020-01-16 ENCOUNTER — Encounter: Payer: Self-pay | Admitting: Hematology and Oncology

## 2020-01-16 ENCOUNTER — Inpatient Hospital Stay: Payer: 59 | Admitting: Hematology and Oncology

## 2020-01-16 ENCOUNTER — Inpatient Hospital Stay: Payer: 59

## 2020-02-09 ENCOUNTER — Other Ambulatory Visit: Payer: Self-pay | Admitting: Internal Medicine

## 2020-02-09 DIAGNOSIS — F329 Major depressive disorder, single episode, unspecified: Secondary | ICD-10-CM

## 2020-02-09 DIAGNOSIS — F419 Anxiety disorder, unspecified: Secondary | ICD-10-CM

## 2020-02-26 ENCOUNTER — Encounter: Payer: 59 | Admitting: Student

## 2020-03-11 ENCOUNTER — Ambulatory Visit: Payer: 59 | Admitting: Student

## 2020-03-11 ENCOUNTER — Other Ambulatory Visit: Payer: Self-pay

## 2020-03-11 ENCOUNTER — Encounter: Payer: Self-pay | Admitting: Student

## 2020-03-11 VITALS — BP 135/54 | HR 78 | Temp 98.1°F | Ht 62.0 in | Wt 153.5 lb

## 2020-03-11 DIAGNOSIS — E119 Type 2 diabetes mellitus without complications: Secondary | ICD-10-CM

## 2020-03-11 DIAGNOSIS — D751 Secondary polycythemia: Secondary | ICD-10-CM | POA: Diagnosis not present

## 2020-03-11 DIAGNOSIS — F419 Anxiety disorder, unspecified: Secondary | ICD-10-CM

## 2020-03-11 DIAGNOSIS — Z0000 Encounter for general adult medical examination without abnormal findings: Secondary | ICD-10-CM

## 2020-03-11 DIAGNOSIS — J449 Chronic obstructive pulmonary disease, unspecified: Secondary | ICD-10-CM

## 2020-03-11 DIAGNOSIS — K469 Unspecified abdominal hernia without obstruction or gangrene: Secondary | ICD-10-CM

## 2020-03-11 DIAGNOSIS — R1031 Right lower quadrant pain: Secondary | ICD-10-CM

## 2020-03-11 DIAGNOSIS — F32A Depression, unspecified: Secondary | ICD-10-CM

## 2020-03-11 DIAGNOSIS — Z23 Encounter for immunization: Secondary | ICD-10-CM

## 2020-03-11 DIAGNOSIS — Z794 Long term (current) use of insulin: Secondary | ICD-10-CM

## 2020-03-11 DIAGNOSIS — K2100 Gastro-esophageal reflux disease with esophagitis, without bleeding: Secondary | ICD-10-CM

## 2020-03-11 DIAGNOSIS — F17200 Nicotine dependence, unspecified, uncomplicated: Secondary | ICD-10-CM

## 2020-03-11 DIAGNOSIS — K429 Umbilical hernia without obstruction or gangrene: Secondary | ICD-10-CM

## 2020-03-11 DIAGNOSIS — K589 Irritable bowel syndrome without diarrhea: Secondary | ICD-10-CM

## 2020-03-11 DIAGNOSIS — E785 Hyperlipidemia, unspecified: Secondary | ICD-10-CM

## 2020-03-11 LAB — POCT GLYCOSYLATED HEMOGLOBIN (HGB A1C): Hemoglobin A1C: 9.4 % — AB (ref 4.0–5.6)

## 2020-03-11 LAB — GLUCOSE, CAPILLARY: Glucose-Capillary: 120 mg/dL — ABNORMAL HIGH (ref 70–99)

## 2020-03-11 MED ORDER — OMEPRAZOLE 20 MG PO CPDR: 20.0000 mg | DELAYED_RELEASE_CAPSULE | Freq: Every day | ORAL | 2 refills | Status: DC

## 2020-03-11 MED ORDER — DULOXETINE HCL 20 MG PO CPEP: 20.0000 mg | ORAL_CAPSULE | Freq: Every day | ORAL | 2 refills | Status: DC

## 2020-03-11 MED ORDER — LISINOPRIL 5 MG PO TABS: 5.0000 mg | ORAL_TABLET | Freq: Every day | ORAL | 1 refills | Status: DC

## 2020-03-11 MED ORDER — SPIRIVA HANDIHALER 18 MCG IN CAPS: 18.0000 ug | ORAL_CAPSULE | Freq: Every day | RESPIRATORY_TRACT | 2 refills | Status: DC

## 2020-03-11 MED ORDER — ADVAIR DISKUS 250-50 MCG/DOSE IN AEPB: INHALATION_SPRAY | RESPIRATORY_TRACT | 2 refills | Status: DC

## 2020-03-11 MED ORDER — HYDROXYZINE PAMOATE 100 MG PO CAPS: ORAL_CAPSULE | ORAL | 0 refills | Status: DC

## 2020-03-11 MED ORDER — CITALOPRAM HYDROBROMIDE 40 MG PO TABS: 40.0000 mg | ORAL_TABLET | Freq: Every day | ORAL | 0 refills | Status: DC

## 2020-03-11 MED ORDER — ALBUTEROL SULFATE HFA 108 (90 BASE) MCG/ACT IN AERS: 1.0000 | INHALATION_SPRAY | Freq: Four times a day (QID) | RESPIRATORY_TRACT | 1 refills | Status: DC | PRN

## 2020-03-11 MED ORDER — ALPRAZOLAM 0.5 MG PO TABS: ORAL_TABLET | ORAL | 0 refills | Status: DC

## 2020-03-11 MED ORDER — TRUE METRIX METER DEVI: 1.0000 | Freq: Three times a day (TID) | 3 refills | Status: DC

## 2020-03-11 NOTE — Patient Instructions (Addendum)
Vanessa [name],  It was a pleasure seeing you today!  Today we discussed your diabetes. Your A1c today was 9.4, which has improved since your last visit. We will continue the [place] at 19 units. Remember, you can adjust the pen by 2-4 units once a week. We want your morning, fasting blood sugars to be between 70-120.  We discussed your hernia today. I have sent in a referral for a surgery appointment. They will contact you to set up the appointment.  We are also drawing a few labs today to check your blood counts, kidney function, and cholesterol. We will call you with the results and if any medication changes are necessary.  We look forward to seeing you next time. Please call our clinic at [phone] if you have any questions or concerns. The best time to call is Monday-Friday from 9am-4pm, but there is someone available 24/7 at the same number. If you need medication refills, please notify your pharmacy one week in advance and they will send [place] a request.  Thank you for letting us take part in your care. Wishing you the best!  Thank you, Dr. Sanjuan Dame, MD

## 2020-03-11 NOTE — Assessment & Plan Note (Signed)
Patient states she previously saw general surgery for hernia and was told surgery would be more painful than beneficial. She states that the hernia has worsened over the last year. She says every time she lifts something or moves around she can feel it growing and is sometimes very painful. Mentions it is sometimes associated with nausea. Patient is adamant that the hernia is affecting her everyday life.  A/P: -Discussed with patient that surgery can be very difficult especially with someone who has multiple co-morbidities. Agreed to send in referral, as patient's condition has worsened and is affecting her everyday life. -Referral for general surgery

## 2020-03-11 NOTE — Assessment & Plan Note (Addendum)
ADDENDUM: Patient referred to [facility] Brunner for further diabetes education, management. Per patient phone call, she has been referred several times and does not wish to have diabetes education.  A1c today 9.4. Mentions she controls her sugars best she can, usually glucometer reads under 170. 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 She has not had eye exam in over 5 years. No acute vision changes, but says she sometimes has to strain to read.   A/P: -Continue Soliqua 19 units per day. Discussed with patient that she can adjust  2-4 units once weekly. Explained that she should adjust until she reaches fasting sugars under 120 in the morning. -Referral for retinal exam

## 2020-03-11 NOTE — Assessment & Plan Note (Signed)
Patient previously seen by Dr. Alvy Bimler, noted to have polycythemia secondary to smoking and COPD. Patient reveals she is undergoing phlebotomy every 3 weeks and asking whether she should continue. Denies HA, CP, blurry vision.  A/P: -Discussed with patient she does not have to have blood drawn every 3 weeks and the best treatment would be smoking cessation. -F/u CBC today

## 2020-03-11 NOTE — Assessment & Plan Note (Signed)
Discussed tobacco cessation during visit today. She states she is not interested in quitting, as she enjoys it too much. Patient verbalizes understanding that smoking increases risk of cardiovascular events, such as heart attack and stroke. Offered cessation techniques, such as patches and medication, but patient prefers to continue smoking.

## 2020-03-11 NOTE — Progress Notes (Signed)
   CC: diabetes follow-up  HPI:  [name] is a 59 y.o. with medical history as listed below who presents to clinic for diabetes follow-up and worsening hernia.  Please see problem-based list for further details.   Past Medical History:  Diagnosis Date  . Asthma    No PFT performed  . COPD (chronic obstructive pulmonary disease) ([place])   . Depression   . Diabetes mellitus 2002  . GERD (gastroesophageal reflux disease)   . Helicobacter pylori (H. pylori) infection    s/p triple therapy  . Hepatic hemangioma   . Hyperlipidemia   . Hypertriglyceridemia   . Panic attacks    mostly Agaraphobia   Review of Systems:  As per HPI.  Physical Exam:  Vitals:   03/11/20 1513  BP: (!) 135/54  Pulse: 78  Temp: 98.1 F (36.7 C)  TempSrc: Oral  SpO2: 94%  Weight: 153 lb 8 oz (69.6 kg)  Height: 5\' 2"  (1.575 m)   General: Siting in chair, no acute distress CV: Regular rate, rhythm. No murmurs, gallops, rubs Pulm: Mild expiratory rhonchi bilaterally. No wheezing, rales. Abd: Protuberant, non-tender. Large hernia present in lower abdomen. Normal bowel sounds.  Assessment & Plan:   See Encounters Tab for problem based charting.  Patient seen with Dr. Heber Vienna

## 2020-03-11 NOTE — Assessment & Plan Note (Signed)
Placed referral for diabetic eye exam. States she has appointment later this month for mammogram and pap smear. She received her flu vaccine today and reports she previously had two doses of COVID-19 vaccine.

## 2020-03-11 NOTE — Assessment & Plan Note (Addendum)
Patient reports she has not taken cholesterol medicine recently. Discussed importance of cholesterol medication given she is diabetic and current tobacco smoker. Will get lipid panel today and start medication as needed.  ADDENDUM: Lipid panel results as below. 10-year ASCVD risk 36%. Will require high-intensity statin. States she previously tolerated Lipitor well.  Lipid Panel     Component Value Date/Time   CHOL 282 (H) 03/11/2020 1644   TRIG 392 (H) 03/11/2020 1644   HDL 26 (L) 03/11/2020 1644   CHOLHDL 10.8 (H) 03/11/2020 1644   CHOLHDL 10.3 12/03/2014 1542   VLDL NOT CALC 12/03/2014 1542   LDLCALC 178 (H) 03/11/2020 1644   LDLDIRECT 158 (H) 12/03/2014 1542   LABVLDL 78 (H) 03/11/2020 1644    A/P: -Lipitor 20mg  per day for 14 days then increase to 40mg  per day.

## 2020-03-12 LAB — CBC
Hematocrit: 49.7 % — ABNORMAL HIGH (ref 34.0–46.6)
Hemoglobin: 16.8 g/dL — ABNORMAL HIGH (ref 11.1–15.9)
MCH: 27.8 pg (ref 26.6–33.0)
MCHC: 33.8 g/dL (ref 31.5–35.7)
MCV: 82 fL (ref 79–97)
Platelets: 525 10*3/uL — ABNORMAL HIGH (ref 150–450)
RBC: 6.05 x10E6/uL — ABNORMAL HIGH (ref 3.77–5.28)
RDW: 12.8 % (ref 11.7–15.4)
WBC: 14 10*3/uL — ABNORMAL HIGH (ref 3.4–10.8)

## 2020-03-12 LAB — LIPID PANEL
Chol/HDL Ratio: 10.8 ratio — ABNORMAL HIGH (ref 0.0–4.4)
Cholesterol, Total: 282 mg/dL — ABNORMAL HIGH (ref 100–199)
HDL: 26 mg/dL — ABNORMAL LOW (ref >39)
LDL Chol Calc (NIH): 178 mg/dL — ABNORMAL HIGH (ref 0–99)
Triglycerides: 392 mg/dL — ABNORMAL HIGH (ref 0–149)
VLDL Cholesterol Cal: 78 mg/dL — ABNORMAL HIGH (ref 5–40)

## 2020-03-12 LAB — BMP8+ANION GAP
Anion Gap: 18 mmol/L (ref 10.0–18.0)
BUN/Creatinine Ratio: 17 (ref 9–23)
BUN: 8 mg/dL (ref 6–24)
CO2: 22 mmol/L (ref 20–29)
Calcium: 10.1 mg/dL (ref 8.7–10.2)
Chloride: 97 mmol/L (ref 96–106)
Creatinine, Ser: 0.47 mg/dL — ABNORMAL LOW (ref 0.57–1.00)
GFR calc Af Amer: 125 mL/min/{1.73_m2} (ref >59)
GFR calc non Af Amer: 108 mL/min/{1.73_m2} (ref >59)
Glucose: 97 mg/dL (ref 65–99)
Potassium: 4.7 mmol/L (ref 3.5–5.2)
Sodium: 137 mmol/L (ref 134–144)

## 2020-03-15 ENCOUNTER — Telehealth: Payer: Self-pay

## 2020-03-15 MED ORDER — ATORVASTATIN CALCIUM 20 MG PO TABS: ORAL_TABLET | ORAL | 0 refills | Status: DC

## 2020-03-15 NOTE — Telephone Encounter (Signed)
Called patient 9/20 and discussed lab work from last week.

## 2020-03-15 NOTE — Telephone Encounter (Signed)
Requesting to speak with Dr. Collene Gobble, please call pt back.

## 2020-03-15 NOTE — Telephone Encounter (Signed)
Return pt's call - stated Dr Collene Gobble called her Friday but when she called back, our office was closed. I will send this message to him

## 2020-03-15 NOTE — Addendum Note (Signed)
Addended bySanjuan Dame on: 03/15/2020 11:21 AM   Modules accepted: Orders

## 2020-03-16 NOTE — Progress Notes (Signed)
Internal Medicine Clinic Attending  I saw and evaluated the patient.  I personally confirmed the key portions of the history and exam documented by Dr. Braswell and I reviewed pertinent patient test results.  The assessment, diagnosis, and plan were formulated together and I agree with the documentation in the resident's note.  

## 2020-03-24 NOTE — Addendum Note (Signed)
Addended by: Hulan Fray on: 03/24/2020 05:18 PM   Modules accepted: Orders

## 2020-05-05 ENCOUNTER — Other Ambulatory Visit: Payer: Self-pay | Admitting: General Surgery

## 2020-05-05 DIAGNOSIS — R1084 Generalized abdominal pain: Secondary | ICD-10-CM

## 2020-05-06 ENCOUNTER — Telehealth: Payer: Self-pay | Admitting: *Deleted

## 2020-05-06 NOTE — Telephone Encounter (Signed)
Called and scheduled the patient for a new patient appt with Dr Denman George on 11/17 at 11:15 am. Patient given the policy for mask and visitors. Patient given the address and phone number of clinic

## 2020-05-07 ENCOUNTER — Other Ambulatory Visit: Payer: Self-pay

## 2020-05-07 DIAGNOSIS — J449 Chronic obstructive pulmonary disease, unspecified: Secondary | ICD-10-CM

## 2020-05-07 MED ORDER — ALBUTEROL SULFATE HFA 108 (90 BASE) MCG/ACT IN AERS: 1.0000 | INHALATION_SPRAY | Freq: Four times a day (QID) | RESPIRATORY_TRACT | 1 refills | Status: DC | PRN

## 2020-05-07 NOTE — Telephone Encounter (Signed)
albuterol [facility] HFA) 108 (90 Base) MCG/ACT inhaler, REFILL REQUEST @  [place] ([place]), [place] - [address] Phone:  [phone]  Fax:  [phone]

## 2020-05-11 ENCOUNTER — Encounter: Payer: Self-pay | Admitting: Gynecologic Oncology

## 2020-05-12 ENCOUNTER — Other Ambulatory Visit: Payer: Self-pay

## 2020-05-12 ENCOUNTER — Encounter: Payer: Self-pay | Admitting: Gynecologic Oncology

## 2020-05-12 ENCOUNTER — Inpatient Hospital Stay: Payer: 59

## 2020-05-12 ENCOUNTER — Telehealth: Payer: Self-pay

## 2020-05-12 ENCOUNTER — Inpatient Hospital Stay: Payer: 59 | Attending: Gynecologic Oncology | Admitting: Gynecologic Oncology

## 2020-05-12 VITALS — BP 127/48 | HR 89 | Temp 97.4°F | Resp 18 | Ht 62.0 in | Wt 154.2 lb

## 2020-05-12 DIAGNOSIS — R140 Abdominal distension (gaseous): Secondary | ICD-10-CM

## 2020-05-12 DIAGNOSIS — D751 Secondary polycythemia: Secondary | ICD-10-CM | POA: Diagnosis present

## 2020-05-12 DIAGNOSIS — F17210 Nicotine dependence, cigarettes, uncomplicated: Secondary | ICD-10-CM | POA: Insufficient documentation

## 2020-05-12 DIAGNOSIS — Z794 Long term (current) use of insulin: Secondary | ICD-10-CM

## 2020-05-12 DIAGNOSIS — R1900 Intra-abdominal and pelvic swelling, mass and lump, unspecified site: Secondary | ICD-10-CM

## 2020-05-12 DIAGNOSIS — E119 Type 2 diabetes mellitus without complications: Secondary | ICD-10-CM

## 2020-05-12 DIAGNOSIS — K429 Umbilical hernia without obstruction or gangrene: Secondary | ICD-10-CM

## 2020-05-12 DIAGNOSIS — R970 Elevated carcinoembryonic antigen [CEA]: Secondary | ICD-10-CM | POA: Insufficient documentation

## 2020-05-12 LAB — COMPREHENSIVE METABOLIC PANEL
ALT: 7 U/L (ref 0–44)
AST: 9 U/L — ABNORMAL LOW (ref 15–41)
Albumin: 3.6 g/dL (ref 3.5–5.0)
Alkaline Phosphatase: 120 U/L (ref 38–126)
Anion gap: 7 (ref 5–15)
BUN: 6 mg/dL (ref 6–20)
CO2: 27 mmol/L (ref 22–32)
Calcium: 9.3 mg/dL (ref 8.9–10.3)
Chloride: 103 mmol/L (ref 98–111)
Creatinine, Ser: 0.67 mg/dL (ref 0.44–1.00)
GFR, Estimated: >60 mL/min (ref >60)
Glucose, Bld: 177 mg/dL — ABNORMAL HIGH (ref 70–99)
Potassium: 4.3 mmol/L (ref 3.5–5.1)
Sodium: 137 mmol/L (ref 135–145)
Total Bilirubin: 0.4 mg/dL (ref 0.3–1.2)
Total Protein: 7.1 g/dL (ref 6.5–8.1)

## 2020-05-12 LAB — HEMOGLOBIN A1C
Hgb A1c MFr Bld: 9.1 % — ABNORMAL HIGH (ref 4.8–5.6)
Mean Plasma Glucose: 214.47 mg/dL

## 2020-05-12 NOTE — Patient Instructions (Signed)
Plan to have a CT scan of the abdomen and pelvis and we will contact you with the results and recommendations moving forward. We will check a hemoglobin A1C today along with a metabolic panel and contact you with the results. If your hemoglobin A1C (picture of how your blood sugar has been running over the past 3 months) is greater than 8, we will have to postpone surgery and get you to see Dr. Collene Gobble for management and improvement in your blood sugar levels.   Surgery date options include June 10, 2020 at [facility] with Dr. Denman George and in [place] at [place] on December 21 or 28, 2021. You will need to have a colonoscopy prior to surgery as well given the elevated CEA level. When a surgery date has been scheduled, we will arrange for you to meet with Joylene John NP in the office to go over your pre-operative instructions.  YOU WILL NEED TO STOP YOUR ASPIRIN 10 DAYS BEFORE SURGERY.

## 2020-05-12 NOTE — Telephone Encounter (Signed)
Patient's A1c 9.1 drawn today at Oncology. Hubbard Hartshorn, BSN, RN-BC

## 2020-05-12 NOTE — Telephone Encounter (Signed)
It appears oncology RN called and informed patient of results, they recommended she have an appointment with [place] to improve her A1c prior to the procedure. If we could schedule this patient an appointment to be seen for diabetes management with [place] and Butch Penny, that would be great. Thank you!

## 2020-05-12 NOTE — Progress Notes (Signed)
Consult Note: Gyn-Onc  Consult was requested by Dr. Ronita Hipps for the evaluation of Vanessa [name] 59 y.o. female  CC:  Chief Complaint  Patient presents with  . Pelvic mass    Assessment/Plan:  Vanessa [name]  is a 59 y.o.  year old with a large (20cm) abdominopelvic mass likely arising from her ovary. It is associated with a normal CA 125 but mildly elevated CEA. She is due for colonoscopy. She has a mild umbilical hernia.   I explained to the patient that I have a low suspicion of malignancy based on the information I have thus far, however with many missing pieces that will need to be filled in.  I am scheduling her for the CT scan of the abdomen and pelvis that she previously canceled as this is essential to assess the location size and features of the mass and for surgical planning.  She will need a colonoscopy preoperatively given the elevated CEA to ensure there is not an occult colon cancer.  I explained that in the setting of a normal colonoscopy, the CEA elevation is most likely due to a mucinous process in the ovarian cyst.    She has a history of poorly controlled diabetes with her last hemoglobin A1c greater than 9% in September 2021.  I suspect this is still elevated.  She did have some adjustments in her insulin at that time.  We will recheck hemoglobin A1c today.  I explained that I typically only offer emergent surgery if hemoglobin A1c is greater than 8% and she will need optimization of her blood glucose prior to surgery if it is greater than that level.  She is on aspirin 81 1 mg daily and she will need to stop this 10 days preoperatively.  A tentative surgery date has been made for late December pending the results of her additional work-up.  Based on my physical exam the limited information I have today, I anticipate this will likely be a mini laparotomy with BSO possible staging with hysterectomy with an umbilical hernia repair.  HPI: Vanessa [name] is a 59 year old P3 who  was seen in consultation at the request of Dr Ronita Hipps for evaluation of a large ovarian cystic mass.  The patient reported noting progressive abdominal distention throughout the summer and fall 2021.  She had been told that this was possibly an umbilical hernia and therefore she saw Dr.Toth who thought that she most likely had an abdominal diastases and recommended CT scan of the abdomen and pelvis which was scheduled for May 25, 2020.  In the meantime she scheduled a routine annual surveillance visit with her gynecologist, Dr. Ronita Hipps, and at that time the abdominopelvic mass was appreciated on examination and she received a transvaginal ultrasound scan with abdominal component that was administered in the office on 05/05/2020.  This revealed a uterus that was poorly seen.  The left and right ovaries could not be discretely seen.  However there was a cystic complex mass beginning above the umbilicus and extending down into both adnexa is.  It measured 22 x 17 x 18.6 cm.  There is no increased blood flow seen.  CEA was drawn at that visit which was mildly elevated at 6.4.  The Ca1 25 drawn on that same day was normal at 19.3.  The patient was told that she needed surgery and therefore canceled her CT scan that had been scheduled for late November.  Review of prior scans from 2014 show that  she had a 7 cm simple appearing cystic mass in the adnexa seen incidentally on the 2014 CT scan there was performed for back pain and identified renal calculus.  She had normal tumor markers and stable findings on repeat imaging in 2017 and 2018.  She had been recommended to continue 6 monthly follow-ups and had failed to present for these on a regular basis.  The patient has multiple medical comorbidities including poorly controlled diabetes mellitus for which she uses insulin.  Her most recent hemoglobin A1c was on 03/11/2020 and was 9.4%.  Following that visit, her PCP Dr. Collene Gobble, adjusted her insulin.  She  fails to take her blood glucose levels regularly at home.  Her diabetes is complicated by peripheral neuropathy.  She has hypercholesterolemia and hypertension.  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  She is a tobacco abuser and smokes more than 1 pack/day.  This is complicated by COPD.    Her performance status is fairly good per patient reporting.  She reports some mild shortness of breath when scaling stairs but can ambulate within the house without shortness of breath.  She does not use oxygen at home and has not been recommended to do so.  She denies chest pain or lower extremity claudication symptoms.  Her last colonoscopy was with Dr. Henrene Pastor in 2018 which revealed multiple polyps which were removed with pathology benign.  Her surgical history is remarkable for 3 prior cesarean sections.  She lives with her husband who himself is a very poor health and has had multiple heart attacks, but is physically independent with activities of daily living.  She does not work outside of the home.   Current Meds:  Outpatient Encounter Medications as of 05/12/2020  Medication Sig  . acetaminophen (TYLENOL) 500 MG tablet Take 1,000 mg by mouth every 6 (six) hours as needed for mild pain.  Marland Kitchen ADVAIR DISKUS 250-50 MCG/DOSE AEPB INHALE ONE DOSE BY MOUTH TWICE DAILY.  Marland Kitchen albuterol (PROAIR HFA) 108 (90 Base) MCG/ACT inhaler Inhale 1-2 puffs into the lungs every 6 (six) hours as needed for wheezing or shortness of breath.  . ALPRAZolam (XANAX) 0.5 MG tablet TAKE 1 TABLET BY MOUTH AT BEDTIME AS NEEDED FOR ANXIETY  . aspirin 81 MG tablet Take 1 tablet (81 mg total) by mouth daily.  Marland Kitchen atorvastatin (LIPITOR) 40 MG tablet Take 40 mg by mouth daily.  . cetirizine (ZYRTEC) 10 MG tablet Take 1 tablet (10 mg total) by mouth daily.  . cimetidine (TAGAMET) 200 MG tablet Take 200 mg by mouth 2 (two) times daily.  . citalopram (CELEXA) 40 MG tablet Take 1 tablet (40 mg  total) by mouth daily.  Marland Kitchen dicyclomine (BENTYL) 20 MG tablet Take 20 mg by mouth every 6 (six) hours.  . gabapentin (NEURONTIN) 300 MG capsule Take 300 mg by mouth at bedtime.  Marland Kitchen glucose blood test strip Use as instructed  . hydrOXYzine (VISTARIL) 100 MG capsule TAKE 1 CAPSULE BY MOUTH THREE TIMES DAILY AS NEEDED FOR ANXIETY  . ibuprofen (ADVIL,MOTRIN) 200 MG tablet Take 600 mg by mouth every 6 (six) hours as needed for moderate pain.  . Insulin Glargine-Lixisenatide (SOLIQUA) 100-33 UNT-MCG/ML SOPN Inject 19 Units into the skin every morning.  Marland Kitchen lisinopril (ZESTRIL) 5 MG tablet Take 1 tablet (5 mg total) by mouth daily.  Marland Kitchen tiotropium (SPIRIVA HANDIHALER) 18 MCG inhalation capsule Place 1 capsule (18 mcg total) into inhaler and inhale daily.  . diphenhydrAMINE (BENADRYL)  25 mg capsule Take 50 mg by mouth every 6 (six) hours as needed for allergies. (Patient not taking: Reported on 05/11/2020)  . fluticasone (FLONASE) 50 MCG/ACT nasal spray Place 2 sprays into both nostrils daily. (Patient not taking: Reported on 05/11/2020)  . Simethicone (GAS-X EXTRA STRENGTH PO) Take 1 tablet by mouth 3 (three) times daily as needed (gas). (Patient not taking: Reported on 05/11/2020)  . [DISCONTINUED] atorvastatin (LIPITOR) 20 MG tablet Take 1 tablet (20 mg total) by mouth daily for 14 days, THEN 2 tablets (40 mg total) daily.  . [DISCONTINUED] dicyclomine (BENTYL) 20 MG tablet Take 1 tablet (20 mg total) by mouth 3 (three) times daily before meals.  . [DISCONTINUED] gabapentin (NEURONTIN) 300 MG capsule Take 1 capsule (300 mg total) by mouth at bedtime.  . [DISCONTINUED] ipratropium-albuterol (DUONEB) 0.5-2.5 (3) MG/3ML SOLN Take 3 mLs by nebulization every 6 (six) hours.  . [DISCONTINUED] niacin (NIASPAN) 500 MG CR tablet Take 1 tablet (500 mg total) by mouth 2 (two) times daily.  . [DISCONTINUED] omeprazole (PRILOSEC) 20 MG capsule Take 1 capsule (20 mg total) by mouth daily.   No facility-administered  encounter medications on file as of 05/12/2020.    Allergy:  Allergies  Allergen Reactions  . Jardiance [Empagliflozin] Other (See Comments)    Severe yeast infection  . Latex Rash  . Avandia [Rosiglitazone]     Headaches     Social Hx:   Social History   Socioeconomic History  . Marital status: Married    Spouse name: Not on file  . Number of children: Not on file  . Years of education: Not on file  . Highest education level: Not on file  Occupational History  . Not on file  Tobacco Use  . Smoking status: Current Every Day Smoker    Packs/day: 1.50    Years: 43.00    Pack years: 64.50    Types: Cigarettes  . Smokeless tobacco: Never Used  Vaping Use  . Vaping Use: Never used  Substance and Sexual Activity  . Alcohol use: No    Alcohol/week: 0.0 standard drinks    Comment: none  . Drug use: No  . Sexual activity: Not Currently  Other Topics Concern  . Not on file  Social History Narrative   Drinks at least a pot of coffee every day.    Social Determinants of Health   Financial Resource Strain:   . Difficulty of Paying Living Expenses: Not on file  Food Insecurity:   . Worried About Charity fundraiser in the Last Year: Not on file  . Ran Out of Food in the Last Year: Not on file  Transportation Needs:   . Lack of Transportation (Medical): Not on file  . Lack of Transportation (Non-Medical): Not on file  Physical Activity:   . Days of Exercise per Week: Not on file  . Minutes of Exercise per Session: Not on file  Stress:   . Feeling of Stress : Not on file  Social Connections:   . Frequency of Communication with Friends and Family: Not on file  . Frequency of Social Gatherings with Friends and Family: Not on file  . Attends Religious Services: Not on file  . Active Member of Clubs or Organizations: Not on file  . Attends Archivist Meetings: Not on file  . Marital Status: Not on file  Intimate Partner Violence:   . Fear of Current or  Ex-Partner: Not on file  . Emotionally Abused: Not  on file  . Physically Abused: Not on file  . Sexually Abused: Not on file    Past Surgical Hx:  Past Surgical History:  Procedure Laterality Date  . CESAREAN SECTION     with 3 children  . OVARIAN CYST REMOVAL     Age 64  . TUBAL LIGATION      Past Medical Hx:  Past Medical History:  Diagnosis Date  . Asthma    No PFT performed  . COPD (chronic obstructive pulmonary disease) ([place])   . Depression   . Diabetes mellitus 2002  . GERD (gastroesophageal reflux disease)   . Helicobacter pylori (H. pylori) infection    s/p triple therapy  . Hepatic hemangioma   . Hyperlipidemia   . Hypertriglyceridemia   . Panic attacks    mostly Agaraphobia    Past Gynecological History:  See HPI, cesarean section x 3. Patient's last menstrual period was 12/24/2013.  Family Hx:  Family History  Problem Relation Age of Onset  . Colon polyps Mother        benign  . Ovarian cancer Mother   . Breast cancer Maternal Aunt   . Diabetes Maternal Uncle   . Colon cancer Neg Hx   . Stomach cancer Neg Hx   . Endometrial cancer Neg Hx   . Prostate cancer Neg Hx   . Pancreatic cancer Neg Hx     Review of Systems:  Constitutional  Feels uncomfortable ENT Cholesterol deposits in eyelids Skin/Breast  No rash, sores, jaundice, itching, dryness Cardiovascular  No chest pain, Pulmonary  + cough and wheeze.  Gastro Intestinal  + abd distension Genito Urinary  No frequency, urgency, dysuria, no bleeding Musculo Skeletal  No myalgia, arthralgia, joint swelling or pain  Neurologic  No weakness, numbness, change in gait,  Psychology  No depression, anxiety, insomnia.   Vitals:  Blood pressure (!) 127/48, pulse 89, temperature (!) 97.4 F (36.3 C), temperature source Tympanic, resp. rate 18, height 5\' 2"  (1.575 m), weight 154 lb 3.2 oz (69.9 kg), last menstrual period 12/24/2013, SpO2 94 %.  Physical Exam: Cholesterol deposits in the  eyelids Neck  Supple NROM, without any enlargements.  Lymph Node Survey No cervical supraclavicular or inguinal adenopathy Cardiovascular  Well perfused peripheries Lungs  No increased WOB at rest Skin  No rash/lesions/breakdown  Psychiatry  Alert and oriented to person, place, and time  Abdomen  Normoactive bowel sounds, abdomen soft, non-tender and distended with firm, smooth nonmobile abdominal mass (cystic, compressible).  Back No CVA tenderness Genito Urinary  Vulva/vagina: Normal external female genitalia.  No lesions. No discharge or bleeding.  Bladder/urethra:  No lesions or masses, well supported bladder  Vagina: smooth, no lesions  Cervix: Normal appearing, no lesions.  Uterus:  Small, mobile, no parametrial involvement or nodularity.  Adnexa: abdominal mass filling pelvis. Rectal  Good tone, no masses no cul de sac nodularity.  Extremities  No bilateral cyanosis, clubbing or edema.  60 minutes of total time was spent for this patient encounter, including preparation, face-to-face counseling with the patient and coordination of care, review of imaging (results and images), communication with the referring provider and documentation of the encounter.   Thereasa Solo, MD  05/12/2020, 11:57 AM

## 2020-05-12 NOTE — Telephone Encounter (Signed)
Told Vanessa [name] that her Hgb A1c  Remains elevated at 9.1.  She nees to contact (PCP)  Dr. Collene Gobble to get optimized before surgery in December. Vanessa [name] stated that she contacted his office this after noon and she is waiting for a return call from his nurse.

## 2020-05-12 NOTE — Telephone Encounter (Signed)
Please call pt back regarding A1C level.

## 2020-05-13 ENCOUNTER — Other Ambulatory Visit: Payer: Self-pay | Admitting: Dietician

## 2020-05-13 ENCOUNTER — Telehealth: Payer: Self-pay | Admitting: Internal Medicine

## 2020-05-13 DIAGNOSIS — Z794 Long term (current) use of insulin: Secondary | ICD-10-CM

## 2020-05-13 DIAGNOSIS — E119 Type 2 diabetes mellitus without complications: Secondary | ICD-10-CM

## 2020-05-13 NOTE — Telephone Encounter (Signed)
We put her on our schedule for tomorrow afternoon as well!

## 2020-05-13 NOTE — Telephone Encounter (Signed)
Great thank you!

## 2020-05-13 NOTE — Telephone Encounter (Signed)
Patient notified of info below. Appt scheduled for tomorrow at 3:15 with [employer] Team. Hubbard Hartshorn, BSN, RN-BC

## 2020-05-14 ENCOUNTER — Ambulatory Visit: Payer: 59 | Admitting: Dietician

## 2020-05-14 ENCOUNTER — Telehealth: Payer: Self-pay | Admitting: Internal Medicine

## 2020-05-14 ENCOUNTER — Encounter: Payer: 59 | Admitting: Internal Medicine

## 2020-05-14 ENCOUNTER — Telehealth: Payer: Self-pay | Admitting: Oncology

## 2020-05-14 ENCOUNTER — Telehealth: Payer: Self-pay

## 2020-05-14 ENCOUNTER — Encounter: Payer: Self-pay | Admitting: Internal Medicine

## 2020-05-14 NOTE — Telephone Encounter (Signed)
Left a message with Dr. Blanch Media nurse at [facility] to see if colonoscopy can be done sooner.  Patient has a tentative surgery date of 06/22/20 with Dr. Denman George.

## 2020-05-14 NOTE — Telephone Encounter (Signed)
Told Vanessa [name] that this office is aware of the 07-02-19 appointment with Dr. Henrene Pastor.  Told her that the Nurse Navigator [name] is reaching out to Dr. Blanch Media office to see about getting he appointment prior to 06-22-20 surgery. Told her that [name] will call her with an update when she has heard back from Dr. Blanch Media office.

## 2020-05-17 ENCOUNTER — Telehealth: Payer: Self-pay

## 2020-05-17 NOTE — Telephone Encounter (Signed)
Hi [name] - I hope it is ok to contact you by chat message. I am the Gyn Oncology Nurse Navigator at the [facility]. Vanessa [name] is going to have surgery with Dr. Denman George on 06/22/20 for a pelvic mass. Dr. Denman George would like her to have the colonoscopy prior to the surgery because she has elevated CEA levels. Right now the colonoscopy is scheduled for 07/02/19 with Dr. Henrene Pastor. Is there any way to move this up? Thank you!   Pts previsit moved to 05/26/20@10am , colon moved to 06/04/20@8am . Pt aware of new appts.

## 2020-05-21 ENCOUNTER — Other Ambulatory Visit: Payer: Self-pay

## 2020-05-21 ENCOUNTER — Ambulatory Visit (HOSPITAL_COMMUNITY)
Admission: RE | Admit: 2020-05-21 | Discharge: 2020-05-21 | Disposition: A | Discharge status: Home / Self Care | Payer: 59 | Source: Ambulatory Visit | Attending: Gynecologic Oncology | Admitting: Gynecologic Oncology

## 2020-05-21 DIAGNOSIS — R140 Abdominal distension (gaseous): Secondary | ICD-10-CM | POA: Insufficient documentation

## 2020-05-21 MED ORDER — IOHEXOL 300 MG/ML  SOLN
100.0000 mL | Freq: Once | INTRAMUSCULAR | Status: AC | PRN
  Administered 2020-05-21: 100 mL via INTRAVENOUS

## 2020-05-25 ENCOUNTER — Other Ambulatory Visit: Payer: 59

## 2020-05-26 ENCOUNTER — Encounter: Payer: Self-pay | Admitting: Internal Medicine

## 2020-05-26 ENCOUNTER — Ambulatory Visit (AMBULATORY_SURGERY_CENTER): Payer: Self-pay

## 2020-05-26 ENCOUNTER — Other Ambulatory Visit: Payer: Self-pay

## 2020-05-26 VITALS — Ht 62.0 in | Wt 154.0 lb

## 2020-05-26 DIAGNOSIS — Z86010 Personal history of colonic polyps: Secondary | ICD-10-CM

## 2020-05-26 MED ORDER — PLENVU 140 G PO SOLR: 1.0000 | ORAL | 0 refills | Status: DC

## 2020-05-26 NOTE — Progress Notes (Signed)
No allergies to soy or egg Pt is not on blood thinners or diet pills Denies issues with sedation/intubation Denies atrial flutter/fib Denies constipation     Pt is aware of Covid safety and care partner requirements.   Is scheduled for surgery 12/28 for ovarian cyst.

## 2020-05-27 ENCOUNTER — Other Ambulatory Visit: Payer: Self-pay | Admitting: Gynecologic Oncology

## 2020-05-27 ENCOUNTER — Telehealth: Payer: Self-pay

## 2020-05-27 ENCOUNTER — Telehealth: Payer: Self-pay | Admitting: Oncology

## 2020-05-27 DIAGNOSIS — R1900 Intra-abdominal and pelvic swelling, mass and lump, unspecified site: Secondary | ICD-10-CM

## 2020-05-27 NOTE — Telephone Encounter (Signed)
Told Ms Abdo that the CT is showing the pelvic mass which is known. There is small  nodule see in her left lung which is felt to be benign. It could be from infection or inflammation.  Nodule on adrenal gland that is felt to be benign since it has been there since 2017 but recommend following it. Her PCP will need to follow the lung and adrenal nodules. Continue to work with PCP for glucose control. Will send a copy of the scan to Dr. Collene Gobble. Pt verbalized understanding.

## 2020-05-27 NOTE — Telephone Encounter (Signed)
Called Vanessa [name] and advised her to try to move up her PCP appointment to give more time for blood glucose control before surgery on 06/22/20.  She said she will call Dr. Lorelee Cover office and will let us know if she is not able to move the appointment up.

## 2020-06-01 ENCOUNTER — Encounter: Payer: 59 | Admitting: Internal Medicine

## 2020-06-03 ENCOUNTER — Telehealth: Payer: Self-pay | Admitting: Internal Medicine

## 2020-06-03 ENCOUNTER — Encounter: Payer: Self-pay | Admitting: Internal Medicine

## 2020-06-03 NOTE — Telephone Encounter (Signed)
Patient vomited after starting Plenvu prep.  Still has half of the initial dose to go.  Unable to leave the house and get alternative preparation.  Have advised her to try to drink the prep slower.  If she vomits again tonight she does not have to try the second half and she can contact us in the morning about rescheduling with different prep.

## 2020-06-04 ENCOUNTER — Encounter: Payer: 59 | Admitting: Internal Medicine

## 2020-06-07 ENCOUNTER — Encounter: Payer: 59 | Admitting: Student

## 2020-06-08 ENCOUNTER — Telehealth: Payer: Self-pay | Admitting: Internal Medicine

## 2020-06-08 ENCOUNTER — Telehealth: Payer: Self-pay | Admitting: *Deleted

## 2020-06-08 NOTE — Telephone Encounter (Signed)
Patient called and asked for the fax number to send FMLA forms. Patient given the office fax number. Per patient her colonoscopy was canceled on 12/10 due to not being able to finish the bowel prep. Called [facility] and scheduled at to 12/23 at 9:30 am with an 8:30 am arrive. The GI office will contact patient with new bowel prep medicine.

## 2020-06-08 NOTE — Telephone Encounter (Signed)
Rescheduled patient with Gyn office requesting to send an alternative prep medication for she had trouble with the Plenvu please advise

## 2020-06-08 NOTE — Telephone Encounter (Signed)
Returned pts call.  She attempted to drink Plenvu last week for colonoscopy and had vomiting.  She is rescheduled for 06/17/20 and needs a new prep.  Will leave new instructions and a sample for Sutab at the 3rd floor front desk.  Reviewed the prep instructions with her over the phone.  She will pick up tomorrow and call back if she has questions.

## 2020-06-10 ENCOUNTER — Telehealth: Payer: Self-pay

## 2020-06-10 ENCOUNTER — Telehealth: Payer: Self-pay | Admitting: *Deleted

## 2020-06-10 NOTE — Telephone Encounter (Signed)
Patient returned TC.  Patient was reminded last dose of aspirin will be tomorrow, December 17th.  Patient verbalized understanding of instructions.   Patient stated she had appointment with PCP to discuss blood sugars but at door screening had temperature of 100F and was "turned away". Patient stated she has not made attempt to reschedule appointment bc she has "a lot of appointments".  No adjustments have been made to improve blood sugars per patient.  Patient encouraged to call PCP and make appointment.  Patient informed A1C will be rechecked at preop appointment on 06/17/2020.  Patient verbalized to all information provided.  Patient has colonoscopy on 06/14/2020.

## 2020-06-10 NOTE — Telephone Encounter (Signed)
TC to patient to remind her to stop aspirin on 06/11/2020 and to discuss blood sugars.  No answer, left message to return call.

## 2020-06-10 NOTE — Telephone Encounter (Signed)
Patient called back and was scheduled for a lab appt tomorrow

## 2020-06-11 ENCOUNTER — Other Ambulatory Visit: Payer: Self-pay

## 2020-06-11 ENCOUNTER — Telehealth: Payer: Self-pay

## 2020-06-11 ENCOUNTER — Inpatient Hospital Stay: Payer: 59 | Attending: Gynecologic Oncology

## 2020-06-11 DIAGNOSIS — D751 Secondary polycythemia: Secondary | ICD-10-CM | POA: Insufficient documentation

## 2020-06-11 DIAGNOSIS — F17210 Nicotine dependence, cigarettes, uncomplicated: Secondary | ICD-10-CM | POA: Insufficient documentation

## 2020-06-11 DIAGNOSIS — E119 Type 2 diabetes mellitus without complications: Secondary | ICD-10-CM

## 2020-06-11 LAB — CBC WITH DIFFERENTIAL/PLATELET
Abs Immature Granulocytes: 0.04 10*3/uL (ref 0.00–0.07)
Basophils Absolute: 0.2 10*3/uL — ABNORMAL HIGH (ref 0.0–0.1)
Basophils Relative: 1 %
Eosinophils Absolute: 0.6 10*3/uL — ABNORMAL HIGH (ref 0.0–0.5)
Eosinophils Relative: 4 %
HCT: 49.5 % — ABNORMAL HIGH (ref 36.0–46.0)
Hemoglobin: 16.6 g/dL — ABNORMAL HIGH (ref 12.0–15.0)
Immature Granulocytes: 0 %
Lymphocytes Relative: 23 %
Lymphs Abs: 3.2 10*3/uL (ref 0.7–4.0)
MCH: 28.9 pg (ref 26.0–34.0)
MCHC: 33.5 g/dL (ref 30.0–36.0)
MCV: 86.1 fL (ref 80.0–100.0)
Monocytes Absolute: 0.8 10*3/uL (ref 0.1–1.0)
Monocytes Relative: 6 %
Neutro Abs: 9.2 10*3/uL — ABNORMAL HIGH (ref 1.7–7.7)
Neutrophils Relative %: 66 %
Platelets: 463 10*3/uL — ABNORMAL HIGH (ref 150–400)
RBC: 5.75 MIL/uL — ABNORMAL HIGH (ref 3.87–5.11)
RDW: 13.5 % (ref 11.5–15.5)
WBC: 14 10*3/uL — ABNORMAL HIGH (ref 4.0–10.5)
nRBC: 0 % (ref 0.0–0.2)

## 2020-06-11 LAB — HEMOGLOBIN A1C
Hgb A1c MFr Bld: 9.3 % — ABNORMAL HIGH (ref 4.8–5.6)
Mean Plasma Glucose: 220.21 mg/dL

## 2020-06-11 NOTE — Telephone Encounter (Signed)
Called to offer appt to see Dr. Alvy Bimler at 1:45 pm with phlebotomy to follow. She is agreeable. Appts scheduled and she is aware of appt times.

## 2020-06-11 NOTE — Telephone Encounter (Signed)
Told Ms Cronce that her Hgb A1c was higher today at 9.3. She needs to contact her PCP to improve control of blood sugar for surgery.

## 2020-06-11 NOTE — Patient Instructions (Signed)
DUE TO COVID-19 ONLY ONE VISITOR IS ALLOWED TO COME WITH YOU AND STAY IN THE WAITING ROOM ONLY DURING PRE OP AND PROCEDURE DAY OF SURGERY. THE 1 VISITOR  MAY VISIT WITH YOU AFTER SURGERY IN YOUR PRIVATE ROOM DURING VISITING HOURS ONLY!  YOU NEED TO HAVE A COVID 19 TEST ON_12/27______ @_______ , THIS TEST MUST BE DONE BEFORE SURGERY,  COVID TESTING SITE [address] [name] [address], IT IS ON THE RIGHT GOING OUT [address] APPROXIMATELY  2 MINUTES PAST [employer] ON THE RIGHT. ONCE YOUR COVID TEST IS COMPLETED,  PLEASE BEGIN THE QUARANTINE INSTRUCTIONS AS OUTLINED IN YOUR HANDOUT.                Vanessa [name]    Your procedure is scheduled on: 06/22/20   Report to [facility] Main  Entrance   Report to  Short Stay at 5:30 AM     Call this number if you have problems the morning of surgery [name], NO CHEWING GUM [name].   No food after midnight.    You may have clear liquid until 4:30 AM.  . Nothing by mouth after 4:30 AM.    Take these medicines the morning of surgery with A SIP OF WATER: Use your inhaler and bring it with you .  How to Manage Your Diabetes Before and After Surgery  Why is it important to control my blood sugar before and after surgery? . Improving blood sugar levels before and after surgery helps healing and can limit problems. . A way of improving blood sugar control is eating a healthy diet by: o  Eating less sugar and carbohydrates o  Increasing activity/exercise o  Talking with your doctor about reaching your blood sugar goals . High blood sugars (greater than 180 mg/dL) can raise your risk of infections and slow your recovery, so you will need to focus on controlling your diabetes during the weeks before surgery. . Make sure that the doctor who takes care of your diabetes knows about your planned surgery including the date and location.  How do  I manage my blood sugar before surgery? . Check your blood sugar at least 4 times a day, starting 2 days before surgery, to make sure that the level is not too high or low. o Check your blood sugar the morning of your surgery when you wake up and every 2 hours until you get to the Short Stay unit. . If your blood sugar is less than 70 mg/dL, you will need to treat for low blood sugar: o Do not take insulin. o Treat a low blood sugar (less than 70 mg/dL) with  cup of clear juice (cranberry or apple), 4 glucose tablets, OR glucose gel. o Recheck blood sugar in 15 minutes after treatment (to make sure it is greater than 70 mg/dL). If your blood sugar is not greater than 70 mg/dL on recheck, call [phone] for further instructions. . Report your blood sugar to the short stay nurse when you get to Short Stay.  . If you are admitted to the hospital after surgery: o Your blood sugar will be checked by the staff and you will probably be given insulin after surgery (instead of oral diabetes medicines) to make sure you have good blood sugar levels. o The goal for blood sugar control after surgery is 80-180 mg/dL.   WHAT DO I  DO ABOUT MY DIABETES MEDICATION?  Marland Kitchen Do not take oral diabetes medicines (pills) the morning of surgery.  . THE NIGHT BEFORE SURGERY, take 0    units of       insulin.       . THE MORNING OF SURGERY, take  0  units of         insulin. .                                   You may not have any metal on your body including hair pins and              piercings  Do not wear jewelry, make-up, lotions, powders or perfumes, deodorant             Do not wear nail polish on your fingernails.  Do not shave  48 hours prior to surgery.                Do not bring valuables to the hospital. [name].  Contacts, dentures or bridgework may not be worn into surgery.       Patients discharged the day of surgery will not be allowed to  drive home. IF YOU ARE HAVING SURGERY AND GOING HOME THE SAME DAY, YOU MUST HAVE AN ADULT TO DRIVE YOU HOME AND BE WITH YOU FOR 24 HOURS. YOU MAY GO HOME BY TAXI OR UBER OR ORTHERWISE, BUT AN ADULT MUST ACCOMPANY YOU HOME AND STAY WITH YOU FOR 24 HOURS.  Name and phone number of your driver:  Special Instructions: N/A              Please read over the following fact sheets you were given: _____________________________________________________________________             [facility] - Preparing for Surgery Before surgery, you can play an important role.  Because skin is not sterile, your skin needs to be as free of germs as possible.  You can reduce the number of germs on your skin by washing with CHG (chlorahexidine gluconate) soap before surgery.  CHG is an antiseptic cleaner which kills germs and bonds with the skin to continue killing germs even after washing. Please DO NOT use if you have an allergy to CHG or antibacterial soaps.  If your skin becomes reddened/irritated stop using the CHG and inform your nurse when you arrive at Short Stay. Do not shave (including legs and underarms) for at least 48 hours prior to the first CHG shower.  You may shave your face/neck. Please follow these instructions carefully:  1.  Shower with CHG Soap the night before surgery and the  morning of Surgery.  2.  If you choose to wash your hair, wash your hair first as usual with your  normal  shampoo.  3.  After you shampoo, rinse your hair and body thoroughly to remove the  shampoo.                           4.  Use CHG as you would any other liquid soap.  You can apply chg directly  to the skin and wash  Gently with a scrungie or clean washcloth.  5.  Apply the CHG Soap to your body ONLY FROM THE NECK DOWN.   Do not use on face/ open                           Wound or open sores. Avoid contact with eyes, ears mouth and genitals (private parts).                       Wash face,  Genitals  (private parts) with your normal soap.             6.  Wash thoroughly, paying special attention to the area where your surgery  will be performed.  7.  Thoroughly rinse your body with warm water from the neck down.  8.  DO NOT shower/wash with your normal soap after using and rinsing off  the CHG Soap.                9.  Pat yourself dry with a clean towel.            10.  Wear clean pajamas.            11.  Place clean sheets on your bed the night of your first shower and do not  sleep with pets. Day of Surgery : Do not apply any lotions/deodorants the morning of surgery.  Please wear clean clothes to the hospital/surgery center.  FAILURE TO FOLLOW THESE INSTRUCTIONS MAY RESULT IN THE CANCELLATION OF YOUR SURGERY PATIENT SIGNATURE_________________________________  NURSE SIGNATURE__________________________________  ________________________________________________________________________

## 2020-06-11 NOTE — Telephone Encounter (Signed)
She called and left a message. Requesting appt.  Dr. Denman George canceled her 12/28 surgery due to a high hgb.

## 2020-06-11 NOTE — Telephone Encounter (Signed)
Told Vanessa [name] that her Hgb today is 16.6. Dr. Alvy Bimler states that her blood is to thick to have a major surgery at this time.  She would be at high risk for complications. She needs to call Dr. Calton Dach office to make an appointment to see her and get an appointment for a phlebotomy.  Her surgery is being cancelled for 06-22-20. She needs to keep the colonoscopy appointment. Her HgbA1c is not back yet today.  This may need to be lower as well for surgery.  Will call her with the results. Could not give her a time frame for surgery to be rescheduled as it is dependent on the blood levels. Pt verbalized understanding.

## 2020-06-14 ENCOUNTER — Ambulatory Visit: Payer: 59 | Admitting: Hematology and Oncology

## 2020-06-14 ENCOUNTER — Encounter (HOSPITAL_COMMUNITY)
Admission: RE | Admit: 2020-06-14 | Discharge: 2020-06-14 | Disposition: A | Discharge status: Home / Self Care | Payer: 59 | Source: Ambulatory Visit | Attending: Anesthesiology | Admitting: Anesthesiology

## 2020-06-14 ENCOUNTER — Inpatient Hospital Stay: Payer: 59

## 2020-06-14 ENCOUNTER — Telehealth: Payer: Self-pay

## 2020-06-14 ENCOUNTER — Telehealth: Payer: Self-pay | Admitting: Internal Medicine

## 2020-06-14 ENCOUNTER — Telehealth: Payer: Self-pay | Admitting: *Deleted

## 2020-06-14 NOTE — Telephone Encounter (Signed)
Unable to find proof of pt's covid vaccination in the her chart; LMOM to have her call back to verify her covid vaccine status

## 2020-06-14 NOTE — Telephone Encounter (Signed)
Pt confirmed that she has had covid vaccines.

## 2020-06-14 NOTE — Telephone Encounter (Signed)
She called and left a message to call her about canceling today's appt.  Left a message asking her to call the office back.

## 2020-06-14 NOTE — Telephone Encounter (Signed)
She called and left a message to call her.  Called back. She is canceling today's appts. She has a colonoscopy on 12/22 and is afraid it will be too much. Appts canceled and sent scheduling message to reschedule after the new year per her request.

## 2020-06-15 ENCOUNTER — Telehealth: Payer: Self-pay | Admitting: Hematology and Oncology

## 2020-06-15 NOTE — Telephone Encounter (Signed)
Scheduled appt per 12/20 sch msg - called pt - no answer. Left message for patient on vmail with appt date and time

## 2020-06-17 ENCOUNTER — Ambulatory Visit (AMBULATORY_SURGERY_CENTER): Payer: 59 | Admitting: Internal Medicine

## 2020-06-17 ENCOUNTER — Encounter: Payer: Self-pay | Admitting: Internal Medicine

## 2020-06-17 ENCOUNTER — Other Ambulatory Visit: Payer: Self-pay

## 2020-06-17 VITALS — BP 122/64 | HR 88 | Temp 97.5°F | Resp 17 | Ht 62.0 in | Wt 154.0 lb

## 2020-06-17 DIAGNOSIS — Z86010 Personal history of colonic polyps: Secondary | ICD-10-CM

## 2020-06-17 MED ORDER — SODIUM CHLORIDE 0.9 % IV SOLN: 500.0000 mL | Freq: Once | INTRAVENOUS | Status: DC

## 2020-06-17 NOTE — Progress Notes (Signed)
Medical history reviewed with no changes noted. VS assessed by N.S. BG 310.

## 2020-06-17 NOTE — Progress Notes (Signed)
PT taken to PACU. Monitors in place. VSS. Report given to RN. 

## 2020-06-17 NOTE — Patient Instructions (Signed)
Handout provided on diverticulosis.   Repeat colonoscopy in 10 years for surveillance.   YOU HAD AN ENDOSCOPIC PROCEDURE TODAY AT [facility]:   Refer to the procedure report that was given to you for any specific questions about what was found during the examination.  If the procedure report does not answer your questions, please call your gastroenterologist to clarify.  If you requested that your care partner not be given the details of your procedure findings, then the procedure report has been included in a sealed envelope for you to review at your convenience later.  YOU SHOULD EXPECT: Some feelings of bloating in the abdomen. Passage of more gas than usual.  Walking can help get rid of the air that was put into your GI tract during the procedure and reduce the bloating. If you had a lower endoscopy (such as a colonoscopy or flexible sigmoidoscopy) you may notice spotting of blood in your stool or on the toilet paper. If you underwent a bowel prep for your procedure, you may not have a normal bowel movement for a few days.  Please Note:  You might notice some irritation and congestion in your nose or some drainage.  This is from the oxygen used during your procedure.  There is no need for concern and it should clear up in a day or so.  SYMPTOMS TO REPORT IMMEDIATELY:   Following lower endoscopy (colonoscopy or flexible sigmoidoscopy):  Excessive amounts of blood in the stool  Significant tenderness or worsening of abdominal pains  Swelling of the abdomen that is new, acute  Fever of 100F or higher  For urgent or emergent issues, a gastroenterologist can be reached at any hour by calling [phone]. Do not use MyChart messaging for urgent concerns.    DIET:  We do recommend a small meal at first, but then you may proceed to your regular diet.  Drink plenty of fluids but you should avoid alcoholic beverages for 24 hours.  ACTIVITY:  You should plan to take it easy  for the rest of today and you should NOT DRIVE or use heavy machinery until tomorrow (because of the sedation medicines used during the test).    FOLLOW UP: Our staff will call the number listed on your records 48-72 hours following your procedure to check on you and address any questions or concerns that you may have regarding the information given to you following your procedure. If we do not reach you, we will leave a message.  We will attempt to reach you two times.  During this call, we will ask if you have developed any symptoms of COVID 19. If you develop any symptoms (ie: fever, flu-like symptoms, shortness of breath, cough etc.) before then, please call [phone].  If you test positive for Covid 19 in the 2 weeks post procedure, please call and report this information to [place].    If any biopsies were taken you will be contacted by phone or by letter within the next 1-3 weeks.  Please call us at [phone] if you have not heard about the biopsies in 3 weeks.    SIGNATURES/CONFIDENTIALITY: You and/or your care partner have signed paperwork which will be entered into your electronic medical record.  These signatures attest to the fact that that the information above on your After Visit Summary has been reviewed and is understood.  Full responsibility of the confidentiality of this discharge information lies with you and/or your care-partner.

## 2020-06-17 NOTE — Progress Notes (Signed)
pts blood sugar 310.  Reported to Dr Henrene Pastor.  No orders received.

## 2020-06-17 NOTE — Op Note (Signed)
[place] Patient Name: Vanessa [name] Procedure Date: 06/17/2020 9:53 AM MRN: [id number] Endoscopist: Docia Chuck. Henrene Pastor , MD Age: 59 Referring MD:  Date of Birth: Sep 16, 1960 Gender: Female Account #: [id number] Procedure:                Colonoscopy Indications:              High risk colon cancer surveillance: Personal                            history of non-advanced adenoma (diminutive adenoma                            April 2018). This examination today is being done                            because the patient has a large pelvic mass and is                            anticipating gynecologic oncology surgery and a                            request has been made to assure that the process is                            not primary colonic. Medicines:                Monitored Anesthesia Care Procedure:                Pre-Anesthesia Assessment:                           - Prior to the procedure, a History and Physical                            was performed, and patient medications and                            allergies were reviewed. The patient's tolerance of                            previous anesthesia was also reviewed. The risks                            and benefits of the procedure and the sedation                            options and risks were discussed with the patient.                            All questions were answered, and informed consent                            was obtained. Prior Anticoagulants: The patient has  taken no previous anticoagulant or antiplatelet                            agents. ASA Grade Assessment: II - A patient with                            mild systemic disease. After reviewing the risks                            and benefits, the patient was deemed in                            satisfactory condition to undergo the procedure.                           After obtaining informed consent, the  colonoscope                            was passed under direct vision. Throughout the                            procedure, the patient's blood pressure, pulse, and                            oxygen saturations were monitored continuously. The                            Olympus CF-HQ190 [id number]) Colonoscope was                            introduced through the anus and advanced to the the                            cecum, identified by appendiceal orifice and                            ileocecal valve. The ileocecal valve, appendiceal                            orifice, and rectum were photographed. The quality                            of the bowel preparation was excellent. The                            colonoscopy was performed without difficulty. The                            patient tolerated the procedure well. The bowel                            preparation used was SUPREP via split dose  instruction. Scope In: 10:06:07 AM Scope Out: 10:19:32 AM Scope Withdrawal Time: 0 hours 9 minutes 56 seconds  Total Procedure Duration: 0 hours 13 minutes 25 seconds  Findings:                 Multiple diverticula were found in the sigmoid                            colon.                           The exam was otherwise without abnormality on                            direct and retroflexion views. Complications:            No immediate complications. Estimated blood loss:                            None. Estimated Blood Loss:     Estimated blood loss: none. Impression:               - Diverticulosis in the sigmoid colon.                           - The examination was otherwise normal on direct                            and retroflexion views.                           - No specimens collected. Recommendation:           - Repeat colonoscopy in 10 years for surveillance.                           - Patient has a contact number available for                             emergencies. The signs and symptoms of potential                            delayed complications were discussed with the                            patient. Return to normal activities tomorrow.                            Written discharge instructions were provided to the                            patient.                           - Resume previous diet.                           - Continue present medications. Docia Chuck. Henrene Pastor, MD 06/17/2020 10:27:27 AM  This report has been signed electronically.

## 2020-06-21 ENCOUNTER — Telehealth: Payer: Self-pay

## 2020-06-21 NOTE — Telephone Encounter (Signed)
NO ANSWER, MESSAGE LEFT FOR PATIENT. 

## 2020-06-22 ENCOUNTER — Ambulatory Visit: Admit: 2020-06-22 | Payer: 59 | Admitting: Gynecologic Oncology

## 2020-06-22 DIAGNOSIS — R1900 Intra-abdominal and pelvic swelling, mass and lump, unspecified site: Secondary | ICD-10-CM

## 2020-06-22 DIAGNOSIS — R970 Elevated carcinoembryonic antigen [CEA]: Secondary | ICD-10-CM

## 2020-06-22 SURGERY — SALPINGO-OOPHORECTOMY, OPEN
Anesthesia: General

## 2020-06-28 ENCOUNTER — Other Ambulatory Visit: Payer: Self-pay | Admitting: Internal Medicine

## 2020-06-28 DIAGNOSIS — J449 Chronic obstructive pulmonary disease, unspecified: Secondary | ICD-10-CM

## 2020-06-28 NOTE — Telephone Encounter (Signed)
  albuterol (PROAIR HFA) 108 (90 Base) MCG/ACT inhaler, refill request @  [id number] - [place] ([place]), [place] - [address] Phone:  [phone]  Fax:  [phone]

## 2020-06-30 ENCOUNTER — Other Ambulatory Visit: Payer: Self-pay | Admitting: Student

## 2020-06-30 DIAGNOSIS — F32A Depression, unspecified: Secondary | ICD-10-CM

## 2020-06-30 DIAGNOSIS — F419 Anxiety disorder, unspecified: Secondary | ICD-10-CM

## 2020-07-01 ENCOUNTER — Encounter: Payer: 59 | Admitting: Internal Medicine

## 2020-07-01 NOTE — Addendum Note (Signed)
Addended by: Neomia Dear on: 07/01/2020 06:43 PM   Modules accepted: Orders

## 2020-07-02 ENCOUNTER — Inpatient Hospital Stay: Payer: 59 | Attending: Gynecologic Oncology | Admitting: Hematology and Oncology

## 2020-07-02 ENCOUNTER — Encounter: Payer: Self-pay | Admitting: Hematology and Oncology

## 2020-07-02 ENCOUNTER — Inpatient Hospital Stay: Payer: 59

## 2020-07-07 ENCOUNTER — Telehealth: Payer: Self-pay | Admitting: Oncology

## 2020-07-07 NOTE — Telephone Encounter (Signed)
I sent new LOS Scheduler can call her Thanks

## 2020-07-07 NOTE — Telephone Encounter (Signed)
Called Vanessa [name] regarding her missed appointment with Dr. Alvy Bimler on 1/7.  She said she was exposed to Covid on Christmas day and and that was day 13 of quarantine.  She said she has tried to call twice to reschedule.    Also encouraged her to keep her appointment with internal medicine on Friday to discuss her diabetes control.  She will need both of these done as well as a HGB A1C rechecked before surgery can be rescheduled.  She verbalized understanding and agreement.

## 2020-07-09 ENCOUNTER — Encounter: Payer: 59 | Admitting: Student

## 2020-07-09 ENCOUNTER — Telehealth: Payer: Self-pay | Admitting: *Deleted

## 2020-07-09 ENCOUNTER — Telehealth: Payer: Self-pay | Admitting: Hematology and Oncology

## 2020-07-09 NOTE — Telephone Encounter (Signed)
Call to patient to see if she would like to reschedule her missed appointment for today.  Message was left that the Clinics had called.  Sander Nephew, RN 07/09/2020 11:11 AM.

## 2020-07-09 NOTE — Telephone Encounter (Signed)
Scheduled apt per 1/12 sch msg - unable to reach pt. Left message for patient with appt date and time

## 2020-07-14 ENCOUNTER — Encounter: Payer: 59 | Admitting: Gynecologic Oncology

## 2020-07-15 ENCOUNTER — Inpatient Hospital Stay: Payer: 59

## 2020-07-15 ENCOUNTER — Telehealth: Payer: Self-pay

## 2020-07-15 ENCOUNTER — Inpatient Hospital Stay: Payer: 59 | Admitting: Hematology and Oncology

## 2020-07-15 NOTE — Telephone Encounter (Signed)
She called to cancel today's appts due to a family emergency. Appts canceled, she said she will call back to reschedule.

## 2020-07-22 ENCOUNTER — Other Ambulatory Visit: Payer: Self-pay | Admitting: Internal Medicine

## 2020-07-22 DIAGNOSIS — J449 Chronic obstructive pulmonary disease, unspecified: Secondary | ICD-10-CM

## 2020-07-26 ENCOUNTER — Telehealth: Payer: Self-pay

## 2020-07-26 NOTE — Telephone Encounter (Signed)
She called and left a message to call her .  Called back. She is wanting to reschedule her missed lab, Dr. Alvy Bimler appt and phlebotomy.  Scheduling message sent to reschedule appts.

## 2020-07-27 ENCOUNTER — Telehealth: Payer: Self-pay | Admitting: Hematology and Oncology

## 2020-07-27 NOTE — Telephone Encounter (Signed)
Scheduled appt per 1/31 sch msg - pt is aware of appt date and time

## 2020-08-06 ENCOUNTER — Inpatient Hospital Stay: Payer: 59 | Admitting: Hematology and Oncology

## 2020-08-06 ENCOUNTER — Inpatient Hospital Stay: Payer: 59 | Attending: Gynecologic Oncology

## 2020-08-06 ENCOUNTER — Inpatient Hospital Stay: Payer: 59

## 2020-08-06 DIAGNOSIS — J449 Chronic obstructive pulmonary disease, unspecified: Secondary | ICD-10-CM | POA: Insufficient documentation

## 2020-08-06 DIAGNOSIS — R1900 Intra-abdominal and pelvic swelling, mass and lump, unspecified site: Secondary | ICD-10-CM | POA: Insufficient documentation

## 2020-08-06 DIAGNOSIS — F17210 Nicotine dependence, cigarettes, uncomplicated: Secondary | ICD-10-CM | POA: Insufficient documentation

## 2020-08-06 DIAGNOSIS — D751 Secondary polycythemia: Secondary | ICD-10-CM | POA: Insufficient documentation

## 2020-08-09 ENCOUNTER — Telehealth: Payer: Self-pay

## 2020-08-09 NOTE — Telephone Encounter (Signed)
She called and left a message to call her back. She did not show up for appt Friday.  Called back and given message from Dr. Jacklynn Lewis, I will only allow one more time reschedule. Tell her is she cancel or no show we will not reschedule and she will be reassign to new MD in the future  If she agrees please reschedule to tomorrow or next week. She verbalized understanding and agrees to above.  Scheduling message sent to reschedule.

## 2020-08-11 ENCOUNTER — Telehealth: Payer: Self-pay | Admitting: Hematology and Oncology

## 2020-08-11 NOTE — Telephone Encounter (Signed)
Scheduled appt per 2/14 sch msg - pt is aware of appt on 2/18

## 2020-08-13 ENCOUNTER — Other Ambulatory Visit: Payer: Self-pay

## 2020-08-13 ENCOUNTER — Other Ambulatory Visit: Payer: Self-pay | Admitting: Hematology and Oncology

## 2020-08-13 ENCOUNTER — Encounter: Payer: Self-pay | Admitting: Hematology and Oncology

## 2020-08-13 ENCOUNTER — Inpatient Hospital Stay: Payer: 59

## 2020-08-13 ENCOUNTER — Telehealth: Payer: Self-pay | Admitting: Hematology and Oncology

## 2020-08-13 ENCOUNTER — Inpatient Hospital Stay: Payer: 59 | Admitting: Hematology and Oncology

## 2020-08-13 VITALS — BP 129/76 | HR 94 | Temp 97.8°F | Resp 18

## 2020-08-13 VITALS — BP 122/58 | HR 90 | Temp 97.6°F | Resp 18 | Ht 62.0 in | Wt 155.6 lb

## 2020-08-13 DIAGNOSIS — D751 Secondary polycythemia: Secondary | ICD-10-CM

## 2020-08-13 DIAGNOSIS — F17210 Nicotine dependence, cigarettes, uncomplicated: Secondary | ICD-10-CM | POA: Diagnosis not present

## 2020-08-13 DIAGNOSIS — R1900 Intra-abdominal and pelvic swelling, mass and lump, unspecified site: Secondary | ICD-10-CM

## 2020-08-13 DIAGNOSIS — F17200 Nicotine dependence, unspecified, uncomplicated: Secondary | ICD-10-CM | POA: Diagnosis not present

## 2020-08-13 DIAGNOSIS — J449 Chronic obstructive pulmonary disease, unspecified: Secondary | ICD-10-CM | POA: Diagnosis not present

## 2020-08-13 LAB — CBC WITH DIFFERENTIAL/PLATELET
Abs Immature Granulocytes: 0.05 10*3/uL (ref 0.00–0.07)
Basophils Absolute: 0.1 10*3/uL (ref 0.0–0.1)
Basophils Relative: 1 %
Eosinophils Absolute: 0.4 10*3/uL (ref 0.0–0.5)
Eosinophils Relative: 3 %
HCT: 49.4 % — ABNORMAL HIGH (ref 36.0–46.0)
Hemoglobin: 16.6 g/dL — ABNORMAL HIGH (ref 12.0–15.0)
Immature Granulocytes: 0 %
Lymphocytes Relative: 29 %
Lymphs Abs: 3.8 10*3/uL (ref 0.7–4.0)
MCH: 29.6 pg (ref 26.0–34.0)
MCHC: 33.6 g/dL (ref 30.0–36.0)
MCV: 88.2 fL (ref 80.0–100.0)
Monocytes Absolute: 0.8 10*3/uL (ref 0.1–1.0)
Monocytes Relative: 6 %
Neutro Abs: 8 10*3/uL — ABNORMAL HIGH (ref 1.7–7.7)
Neutrophils Relative %: 61 %
Platelets: 388 10*3/uL (ref 150–400)
RBC: 5.6 MIL/uL — ABNORMAL HIGH (ref 3.87–5.11)
RDW: 13.2 % (ref 11.5–15.5)
WBC: 13.2 10*3/uL — ABNORMAL HIGH (ref 4.0–10.5)
nRBC: 0 % (ref 0.0–0.2)

## 2020-08-13 NOTE — Progress Notes (Signed)
Vanessa NOTE  Sanjuan Dame, MD  ASSESSMENT & PLAN:  Polycythemia, secondary We discussed the importance of getting her hemoglobin down before she proceed with gynecological procedure We discussed the importance of compliance and the importance of smoking cessation I recommend phlebotomy every 2 weeks to keep hemoglobin under 14  Tobacco dependence She does not appear interested to quit smoking at this point We discussed the importance of smoking cessation in anticipation for future surgery  Pelvic mass She was evaluated by gynecologist her other risk factors, surgery was postponed I will informed the gynecologist about her progress and as soon as we can get her hemoglobin under 14, we will reschedule her surgery   Orders Placed This Encounter  Procedures  . CBC with Differential/Platelet    Standing Status:   Standing    Number of Occurrences:   22    Standing Expiration Date:   08/13/2021    The total time spent in the appointment was 20 minutes encounter with patients including review of chart and various tests results, discussions about plan of care and coordination of care plan   All questions were answered. The patient knows to call the clinic with any problems, questions or concerns. No barriers to learning was detected.    Vanessa Lark, MD 2/18/20221:25 PM  INTERVAL HISTORY: Vanessa [name] 60 y.o. 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 Her husband, Vanessa [name] is available with her at the appointment today  She was found to have abnormal CBC from recent  visit to her primary care doctor's office On review of her electronic records from 2007 until her most recent blood work from October 14, 2019, hemoglobin fluctuated from 14.7 to as high as 18.1  She denies intermittent headaches, frequent leg cramps and occasional chest pain.  She has shortness of breath and chronic fatigue secondary to COPD She has been diagnosed with COPD for at least 5 years but does not need oxygen She has been smoking since the age of 17, currently at 2 packs of cigarettes per day Her husband used to smoke but quit many years ago when he was diagnosed with a heart attack According to her husband, the patient is reluctant to quit because this is her coping strategy  She never suffer from diagnosis of blood clot.  There is no prior diagnosis of obstructive sleep apnea.  She underwent extensive evaluation in April 2021 and was found to have secondary polycythemia.  JAK2 mutation/erythropoietin levels were normal She was started on phlebotomy. At the end of 2021, she was found to have a pelvic mass but unable to get surgery due to her uncontrolled diabetes and high hemoglobin  I have reviewed the past medical history, past surgical history, social history and family history with the patient and they are unchanged from previous note.  ALLERGIES:  is allergic to jardiance [empagliflozin], latex, and avandia [rosiglitazone].  MEDICATIONS:  Current Outpatient Medications  Medication Sig Dispense Refill  . acetaminophen (TYLENOL) 500 MG tablet Take 1,000 mg by mouth every 6 (six) hours as needed for mild pain.    Marland Kitchen ADVAIR DISKUS 250-50 MCG/DOSE AEPB  INHALE ONE DOSE BY MOUTH TWICE DAILY. (Patient taking differently: Inhale 1 puff into the lungs in the morning and at bedtime. INHALE ONE DOSE BY MOUTH TWICE DAILY.) 180 each 2  . albuterol (VENTOLIN HFA) 108 (90 Base) MCG/ACT inhaler INHALE 1 TO 2 PUFFS BY MOUTH EVERY 6 HOURS AS NEEDED FOR WHEEZING FOR SHORTNESS OF BREATH 9 g 0  .  ALPRAZolam (XANAX) 0.5 MG tablet TAKE 1 TABLET BY MOUTH AT BEDTIME AS NEEDED FOR ANXIETY (Patient taking differently: Take 0.5 mg by mouth at bedtime as needed for anxiety. TAKE 1 TABLET BY MOUTH AT BEDTIME AS NEEDED FOR ANXIETY) 30 tablet 0  . aspirin 81 MG tablet Take 1 tablet (81 mg total) by mouth daily. 30 tablet 2  . atorvastatin (LIPITOR) 40 MG tablet Take 40 mg by mouth daily.    . cetirizine (ZYRTEC) 10 MG tablet Take 1 tablet (10 mg total) by mouth daily. (Patient not taking: Reported on 06/17/2020) 30 tablet 2  . cimetidine (TAGAMET) 200 MG tablet Take 200 mg by mouth 2 (two) times daily.    . citalopram (CELEXA) 40 MG tablet Take 1 tablet (40 mg total) by mouth daily. 90 tablet 0  . dicyclomine (BENTYL) 20 MG tablet Take 20 mg by mouth every 6 (six) hours.    . fluticasone (FLONASE) 50 MCG/ACT nasal spray Place 2 sprays into both nostrils daily. (Patient not taking: Reported on 06/17/2020) 15.8 mL 2  . gabapentin (NEURONTIN) 300 MG capsule Take 300 mg by mouth at bedtime.    Marland Kitchen glucose blood test strip Use as instructed 100 each 12  . hydrOXYzine (VISTARIL) 100 MG capsule TAKE 1 CAPSULE BY MOUTH THREE TIMES DAILY AS NEEDED FOR ANXIETY (Patient taking differently: Take 100 mg by mouth in the morning, at noon, and at bedtime. TAKE 1 CAPSULE BY MOUTH THREE TIMES DAILY FOR ANXIETY) 30 capsule 0  . ibuprofen (ADVIL,MOTRIN) 200 MG tablet Take 600 mg by mouth every 6 (six) hours as needed for moderate pain.    . Insulin Glargine-Lixisenatide (SOLIQUA) 100-33 UNT-MCG/ML SOPN Inject 19 Units into the skin every morning. 15 mL 2  . lisinopril (ZESTRIL) 5 MG tablet Take 1 tablet (5 mg total) by mouth daily. 90 tablet 1  . tiotropium (SPIRIVA HANDIHALER) 18 MCG inhalation capsule Place 1 capsule (18 mcg total) into inhaler and inhale daily. 90 capsule 2   No current facility-administered medications for this visit.     REVIEW OF SYSTEMS:   Constitutional: Denies fevers, chills or night  sweats Eyes: Denies blurriness of vision Ears, nose, mouth, throat, and face: Denies mucositis or sore throat Respiratory: Denies cough, dyspnea or wheezes Cardiovascular: Denies palpitation, chest discomfort or lower extremity swelling Gastrointestinal:  Denies nausea, heartburn or change in bowel habits Skin: Denies abnormal skin rashes Lymphatics: Denies new lymphadenopathy or easy bruising Neurological:Denies numbness, tingling or new weaknesses Behavioral/Psych: Mood is stable, no new changes  All other systems were reviewed with the patient and are negative.  PHYSICAL EXAMINATION: ECOG PERFORMANCE STATUS: 1 - Symptomatic but completely ambulatory  Vitals:   08/13/20 1229  BP: (!) 122/58  Pulse: 90  Resp: 18  Temp: 97.6 F (36.4 C)  SpO2: 91%   Filed Weights   08/13/20 1229  Weight: 155 lb 9.6 oz (70.6 kg)    GENERAL:alert, no distress and comfortable ABDOMEN:abdomen soft, but appear grossly distended Musculoskeletal:no cyanosis of digits and no clubbing  NEURO: alert & oriented x 3 with fluent speech, no focal motor/sensory deficits  LABORATORY DATA:  I have reviewed the data as listed     Component Value Date/Time   NA 137 05/12/2020 1220   NA 137 03/11/2020 1644   K 4.3 05/12/2020 1220   CL 103 05/12/2020 1220   CO2 27 05/12/2020 1220   GLUCOSE 177 (H) 05/12/2020 1220   BUN 6 05/12/2020 1220   BUN 8 03/11/2020 1644   CREATININE 0.67 05/12/2020 1220   CREATININE 0.45 (L) 12/03/2014 1542   CALCIUM 9.3 05/12/2020 1220   PROT 7.1 05/12/2020 1220   ALBUMIN 3.6 05/12/2020 1220   AST 9 (L) 05/12/2020 1220   ALT 7 05/12/2020 1220   ALKPHOS 120 05/12/2020 1220   BILITOT 0.4 05/12/2020 1220   GFRNONAA >60 05/12/2020 1220   GFRNONAA >89 12/03/2014 1542   GFRAA 125 03/11/2020 1644   GFRAA >89 12/03/2014 1542    No results found for: SPEP, UPEP  Lab Results  Component Value Date   WBC 13.2 (H) 08/13/2020   NEUTROABS 8.0 (H) 08/13/2020   HGB 16.6 (H)  08/13/2020   HCT 49.4 (H) 08/13/2020   MCV 88.2 08/13/2020   PLT 388 08/13/2020      Chemistry      Component Value Date/Time   NA 137 05/12/2020 1220   NA 137 03/11/2020 1644   K 4.3 05/12/2020 1220   CL 103 05/12/2020 1220   CO2 27 05/12/2020 1220   BUN 6 05/12/2020 1220   BUN 8 03/11/2020 1644   CREATININE 0.67 05/12/2020 1220   CREATININE 0.45 (L) 12/03/2014 1542      Component Value Date/Time   CALCIUM 9.3 05/12/2020 1220   ALKPHOS 120 05/12/2020 1220   AST 9 (L) 05/12/2020 1220   ALT 7 05/12/2020 1220   BILITOT 0.4 05/12/2020 1220

## 2020-08-13 NOTE — Assessment & Plan Note (Signed)
We discussed the importance of getting her hemoglobin down before she proceed with gynecological procedure We discussed the importance of compliance and the importance of smoking cessation I recommend phlebotomy every 2 weeks to keep hemoglobin under 14

## 2020-08-13 NOTE — Progress Notes (Signed)
Pt meets parameters for Phlebotomy per Dr Francis Gaines. 18g IV Phlembotomy needle placed and started at 1258, completed at 1308 514g removed. Provided pt with snack and beverage, remained stable for 30 minutes post observation. VSS

## 2020-08-13 NOTE — Patient Instructions (Signed)

## 2020-08-13 NOTE — Telephone Encounter (Signed)
Scheduled appts per /218 sch msg. Gave pt a print out of AVS.  

## 2020-08-13 NOTE — Assessment & Plan Note (Signed)
She does not appear interested to quit smoking at this point We discussed the importance of smoking cessation in anticipation for future surgery

## 2020-08-13 NOTE — Assessment & Plan Note (Signed)
She was evaluated by gynecologist her other risk factors, surgery was postponed I will informed the gynecologist about her progress and as soon as we can get her hemoglobin under 14, we will reschedule her surgery

## 2020-08-27 ENCOUNTER — Other Ambulatory Visit: Payer: Self-pay

## 2020-08-27 ENCOUNTER — Inpatient Hospital Stay: Payer: 59 | Attending: Gynecologic Oncology

## 2020-08-27 ENCOUNTER — Inpatient Hospital Stay: Payer: 59

## 2020-08-27 VITALS — BP 118/60 | HR 92 | Temp 98.7°F | Resp 18

## 2020-08-27 DIAGNOSIS — D751 Secondary polycythemia: Secondary | ICD-10-CM | POA: Diagnosis not present

## 2020-08-27 DIAGNOSIS — F17210 Nicotine dependence, cigarettes, uncomplicated: Secondary | ICD-10-CM | POA: Insufficient documentation

## 2020-08-27 LAB — CBC WITH DIFFERENTIAL/PLATELET
Abs Immature Granulocytes: 0.06 10*3/uL (ref 0.00–0.07)
Basophils Absolute: 0.1 10*3/uL (ref 0.0–0.1)
Basophils Relative: 1 %
Eosinophils Absolute: 0.6 10*3/uL — ABNORMAL HIGH (ref 0.0–0.5)
Eosinophils Relative: 4 %
HCT: 47.1 % — ABNORMAL HIGH (ref 36.0–46.0)
Hemoglobin: 16.1 g/dL — ABNORMAL HIGH (ref 12.0–15.0)
Immature Granulocytes: 0 %
Lymphocytes Relative: 23 %
Lymphs Abs: 3.2 10*3/uL (ref 0.7–4.0)
MCH: 29.8 pg (ref 26.0–34.0)
MCHC: 34.2 g/dL (ref 30.0–36.0)
MCV: 87.2 fL (ref 80.0–100.0)
Monocytes Absolute: 0.8 10*3/uL (ref 0.1–1.0)
Monocytes Relative: 6 %
Neutro Abs: 9.3 10*3/uL — ABNORMAL HIGH (ref 1.7–7.7)
Neutrophils Relative %: 66 %
Platelets: 466 10*3/uL — ABNORMAL HIGH (ref 150–400)
RBC: 5.4 MIL/uL — ABNORMAL HIGH (ref 3.87–5.11)
RDW: 13.6 % (ref 11.5–15.5)
WBC: 14.1 10*3/uL — ABNORMAL HIGH (ref 4.0–10.5)
nRBC: 0 % (ref 0.0–0.2)

## 2020-08-27 NOTE — Progress Notes (Signed)
Patient presented today for phlebotomy per MD order. An 18 gauge needle was placed in the left forearm and 535 mls of blood was removed. Catheter was removed fully intact.  Patent remained 30 minutes post observation, nourishment offered and declined.   Discharged in stable condition with no complaints.

## 2020-08-27 NOTE — Patient Instructions (Signed)

## 2020-09-06 ENCOUNTER — Other Ambulatory Visit: Payer: Self-pay

## 2020-09-06 DIAGNOSIS — J449 Chronic obstructive pulmonary disease, unspecified: Secondary | ICD-10-CM

## 2020-09-06 DIAGNOSIS — F419 Anxiety disorder, unspecified: Secondary | ICD-10-CM

## 2020-09-06 DIAGNOSIS — F32A Depression, unspecified: Secondary | ICD-10-CM

## 2020-09-06 MED ORDER — ALBUTEROL SULFATE HFA 108 (90 BASE) MCG/ACT IN AERS
INHALATION_SPRAY | RESPIRATORY_TRACT | 0 refills | Status: DC
Start: 2020-09-06 — End: 2020-10-06

## 2020-09-06 MED ORDER — HYDROXYZINE PAMOATE 100 MG PO CAPS: ORAL_CAPSULE | ORAL | 0 refills | Status: DC

## 2020-09-06 NOTE — Telephone Encounter (Signed)
Need refill on albuterol (VENTOLIN HFA) 108 (90 Base) MCG/ACT inhaler hydrOXYzine (VISTARIL) 100 MG capsule ;pt contact [facility] ([place]), [place] - [address]

## 2020-09-10 ENCOUNTER — Inpatient Hospital Stay: Payer: 59

## 2020-09-10 ENCOUNTER — Telehealth: Payer: Self-pay

## 2020-09-10 NOTE — Telephone Encounter (Signed)
Called regarding no show for appt today. She said she called earlier this morning and spoke with a scheduler to cancel appts. The scheduler was supposed to cancel appts. She was not feeling well when she woke up. Instructed to call the office to cancel appts if she is not coming to appts. . She verbalized understanding.

## 2020-09-24 ENCOUNTER — Inpatient Hospital Stay: Payer: 59

## 2020-09-24 ENCOUNTER — Telehealth: Payer: Self-pay

## 2020-09-24 NOTE — Telephone Encounter (Signed)
She called and left a message to cancel her lab and phlebotomy today. Appts canceled.

## 2020-09-27 NOTE — Telephone Encounter (Signed)
Vanessa [name], can you call and ask if she is interested to continue phlebotomy? She has multiple cancellations and no shows

## 2020-09-27 NOTE — Telephone Encounter (Signed)
Spoke with patient, she does want to continue phlebotomies.  RN verified upcoming appointments, and educated on importance of keeping appointments.

## 2020-10-06 ENCOUNTER — Other Ambulatory Visit: Payer: Self-pay

## 2020-10-06 ENCOUNTER — Other Ambulatory Visit: Payer: Self-pay | Admitting: Student

## 2020-10-06 DIAGNOSIS — J449 Chronic obstructive pulmonary disease, unspecified: Secondary | ICD-10-CM

## 2020-10-06 MED ORDER — ALBUTEROL SULFATE HFA 108 (90 BASE) MCG/ACT IN AERS
INHALATION_SPRAY | RESPIRATORY_TRACT | 0 refills | Status: DC
Start: 2020-10-06 — End: 2020-11-05

## 2020-10-06 NOTE — Telephone Encounter (Signed)
Pt is requesting her albuterol (VENTOLIN HFA) 108 (90 Base) MCG/ACT inhaler   sent to  [facility] ([place]), [place] - [address] Phone:  [phone]  Fax:  [phone]

## 2020-10-08 ENCOUNTER — Other Ambulatory Visit: Payer: Self-pay

## 2020-10-08 ENCOUNTER — Inpatient Hospital Stay: Payer: 59

## 2020-10-08 ENCOUNTER — Inpatient Hospital Stay: Payer: 59 | Attending: Gynecologic Oncology

## 2020-10-08 VITALS — BP 127/69 | HR 93 | Temp 98.5°F | Resp 18 | Ht 62.0 in | Wt 157.0 lb

## 2020-10-08 DIAGNOSIS — F17210 Nicotine dependence, cigarettes, uncomplicated: Secondary | ICD-10-CM | POA: Diagnosis present

## 2020-10-08 DIAGNOSIS — D751 Secondary polycythemia: Secondary | ICD-10-CM | POA: Diagnosis present

## 2020-10-08 LAB — CBC WITH DIFFERENTIAL/PLATELET
Abs Immature Granulocytes: 0.04 10*3/uL (ref 0.00–0.07)
Basophils Absolute: 0.1 10*3/uL (ref 0.0–0.1)
Basophils Relative: 1 %
Eosinophils Absolute: 0.4 10*3/uL (ref 0.0–0.5)
Eosinophils Relative: 4 %
HCT: 46.2 % — ABNORMAL HIGH (ref 36.0–46.0)
Hemoglobin: 15.2 g/dL — ABNORMAL HIGH (ref 12.0–15.0)
Immature Granulocytes: 0 %
Lymphocytes Relative: 25 %
Lymphs Abs: 2.9 10*3/uL (ref 0.7–4.0)
MCH: 28.7 pg (ref 26.0–34.0)
MCHC: 32.9 g/dL (ref 30.0–36.0)
MCV: 87.2 fL (ref 80.0–100.0)
Monocytes Absolute: 0.7 10*3/uL (ref 0.1–1.0)
Monocytes Relative: 6 %
Neutro Abs: 7.3 10*3/uL (ref 1.7–7.7)
Neutrophils Relative %: 64 %
Platelets: 460 10*3/uL — ABNORMAL HIGH (ref 150–400)
RBC: 5.3 MIL/uL — ABNORMAL HIGH (ref 3.87–5.11)
RDW: 13.4 % (ref 11.5–15.5)
WBC: 11.5 10*3/uL — ABNORMAL HIGH (ref 4.0–10.5)
nRBC: 0 % (ref 0.0–0.2)

## 2020-10-08 NOTE — Patient Instructions (Signed)

## 2020-10-08 NOTE — Progress Notes (Signed)
.  Vanessa [name] presents today for phlebotomy per MD orders. Phlebotomy procedure started at 1216 and ended at 1229. 501 grams removed via 18 G IV needle in LFA. IV needle removed intact. Patient observed for 15 minutes after procedure without any incident - patient declined to stay for full observation period. Refreshments offered - patient declined at this time.  Patient tolerated procedure well.

## 2020-10-19 ENCOUNTER — Telehealth: Payer: Self-pay | Admitting: Oncology

## 2020-10-19 ENCOUNTER — Telehealth: Payer: Self-pay

## 2020-10-19 NOTE — Telephone Encounter (Signed)
She called and left a message checking on the referral back Dr. Denman George.

## 2020-10-19 NOTE — Telephone Encounter (Signed)
Left a message for Vanessa [name] advising her that we are waiting to see recommendations from her PCP regarding medication adjustments to help with her blood sugar.  Also advised once we have this, we will call to schedule her to see Dr. Denman George to discuss surgery scheduling.  Requested a return call with any questions.

## 2020-10-19 NOTE — Telephone Encounter (Signed)
I will reach out to Dr. Denman George again today

## 2020-10-20 ENCOUNTER — Encounter: Payer: 59 | Admitting: Student

## 2020-10-26 ENCOUNTER — Telehealth: Payer: Self-pay

## 2020-10-26 NOTE — Telephone Encounter (Signed)
Call placed back to pt after her VM left with [facility]. Pt verbalized that she rescheduled her appt with her PCP to 11/03/20. This RN discussed the importance of keeping scheduled appts with PCP so her surgery can be scheduled. PT verbalizes understanding that her PCP has to evaluate her glocuse and hemoglobin before surgery is scheduled. PT verbalizes understanding of importance of keeping PCP appts.

## 2020-11-03 ENCOUNTER — Encounter: Payer: 59 | Admitting: Internal Medicine

## 2020-11-05 ENCOUNTER — Other Ambulatory Visit: Payer: Self-pay

## 2020-11-05 DIAGNOSIS — J449 Chronic obstructive pulmonary disease, unspecified: Secondary | ICD-10-CM

## 2020-11-05 MED ORDER — ALBUTEROL SULFATE HFA 108 (90 BASE) MCG/ACT IN AERS: INHALATION_SPRAY | RESPIRATORY_TRACT | 0 refills | Status: DC

## 2020-11-05 NOTE — Telephone Encounter (Signed)
  albuterol (VENTOLIN HFA) 108 (90 Base) MCG/ACT inhaler, REFILL REQUEST @  [id number] - [place] ([place]), [place] - [address] Phone:  [phone]  Fax:  [phone]     

## 2020-11-20 ENCOUNTER — Other Ambulatory Visit: Payer: Self-pay | Admitting: Internal Medicine

## 2020-12-06 ENCOUNTER — Other Ambulatory Visit: Payer: Self-pay

## 2020-12-06 DIAGNOSIS — J449 Chronic obstructive pulmonary disease, unspecified: Secondary | ICD-10-CM

## 2020-12-06 MED ORDER — ALBUTEROL SULFATE HFA 108 (90 BASE) MCG/ACT IN AERS: INHALATION_SPRAY | RESPIRATORY_TRACT | 11 refills | Status: DC

## 2020-12-06 NOTE — Telephone Encounter (Signed)
  albuterol (VENTOLIN HFA) 108 (90 Base) MCG/ACT inhaler, REFILL REQUEST @  [place] ([place]), [place] - [address] Phone:  [phone]  Fax:  [phone]

## 2020-12-13 ENCOUNTER — Encounter: Payer: 59 | Admitting: Student

## 2020-12-15 ENCOUNTER — Encounter: Payer: 59 | Admitting: Student

## 2020-12-23 ENCOUNTER — Encounter: Payer: 59 | Admitting: Student

## 2021-03-01 ENCOUNTER — Telehealth: Payer: Self-pay | Admitting: *Deleted

## 2021-03-01 NOTE — Telephone Encounter (Signed)
The following medication records are no longer available for ordering. Place a new order with a different medication record. ADVAIR DISKUS 250-50 MCG/DOSE AEPB

## 2021-03-03 ENCOUNTER — Other Ambulatory Visit: Payer: Self-pay | Admitting: Student

## 2021-03-03 DIAGNOSIS — J449 Chronic obstructive pulmonary disease, unspecified: Secondary | ICD-10-CM

## 2021-03-03 MED ORDER — FLUTICASONE-SALMETEROL 250-50 MCG/ACT IN AEPB: 1.0000 | INHALATION_SPRAY | Freq: Two times a day (BID) | RESPIRATORY_TRACT | 2 refills | Status: DC

## 2021-03-03 NOTE — Telephone Encounter (Signed)
Needs a refill on Advair dikus 250 / 50 mcg Inhale 1 dose twice daily Thanks

## 2021-04-27 ENCOUNTER — Encounter: Payer: 59 | Admitting: Student

## 2021-05-03 ENCOUNTER — Encounter: Payer: 59 | Admitting: Student

## 2021-05-09 ENCOUNTER — Other Ambulatory Visit: Payer: Self-pay | Admitting: Student

## 2021-05-09 DIAGNOSIS — J449 Chronic obstructive pulmonary disease, unspecified: Secondary | ICD-10-CM

## 2021-05-12 ENCOUNTER — Encounter: Payer: 59 | Admitting: Student

## 2021-05-26 ENCOUNTER — Ambulatory Visit (INDEPENDENT_AMBULATORY_CARE_PROVIDER_SITE_OTHER): Payer: 59 | Admitting: *Deleted

## 2021-05-26 DIAGNOSIS — Z23 Encounter for immunization: Secondary | ICD-10-CM | POA: Diagnosis not present

## 2021-06-15 ENCOUNTER — Encounter: Payer: 59 | Admitting: Student

## 2021-07-26 ENCOUNTER — Other Ambulatory Visit: Payer: Self-pay | Admitting: Student

## 2021-07-26 DIAGNOSIS — J449 Chronic obstructive pulmonary disease, unspecified: Secondary | ICD-10-CM

## 2021-07-26 NOTE — Telephone Encounter (Signed)
Last OV 02/2020. I called pt to schedule an appt - she stated her husband is currently in the hospital. Call transferred to front office - appt scheduled with Dr Coy Saunas 07/29/21.

## 2021-07-28 ENCOUNTER — Other Ambulatory Visit: Payer: Self-pay | Admitting: Student

## 2021-07-28 ENCOUNTER — Other Ambulatory Visit: Payer: Self-pay

## 2021-07-28 DIAGNOSIS — J449 Chronic obstructive pulmonary disease, unspecified: Secondary | ICD-10-CM

## 2021-07-28 MED ORDER — SPIRIVA HANDIHALER 18 MCG IN CAPS: ORAL_CAPSULE | RESPIRATORY_TRACT | 0 refills | Status: DC

## 2021-07-29 ENCOUNTER — Encounter: Payer: 59 | Admitting: Student

## 2021-07-29 NOTE — Addendum Note (Signed)
Addended by: Velora Heckler on: 07/29/2021 10:27 AM   Modules accepted: Orders

## 2021-09-19 ENCOUNTER — Encounter: Payer: 59 | Admitting: Student

## 2021-09-21 ENCOUNTER — Encounter: Payer: Self-pay | Admitting: Student

## 2021-09-21 ENCOUNTER — Other Ambulatory Visit: Payer: Self-pay

## 2021-09-21 ENCOUNTER — Ambulatory Visit: Payer: 59 | Admitting: Student

## 2021-09-21 VITALS — BP 139/61 | HR 79 | Temp 98.0°F | Ht 62.0 in | Wt 155.9 lb

## 2021-09-21 DIAGNOSIS — E119 Type 2 diabetes mellitus without complications: Secondary | ICD-10-CM | POA: Diagnosis not present

## 2021-09-21 DIAGNOSIS — F17210 Nicotine dependence, cigarettes, uncomplicated: Secondary | ICD-10-CM

## 2021-09-21 DIAGNOSIS — D751 Secondary polycythemia: Secondary | ICD-10-CM | POA: Diagnosis not present

## 2021-09-21 DIAGNOSIS — Z794 Long term (current) use of insulin: Secondary | ICD-10-CM | POA: Diagnosis not present

## 2021-09-21 DIAGNOSIS — F419 Anxiety disorder, unspecified: Secondary | ICD-10-CM

## 2021-09-21 DIAGNOSIS — R1900 Intra-abdominal and pelvic swelling, mass and lump, unspecified site: Secondary | ICD-10-CM | POA: Diagnosis not present

## 2021-09-21 DIAGNOSIS — F32A Depression, unspecified: Secondary | ICD-10-CM

## 2021-09-21 DIAGNOSIS — K589 Irritable bowel syndrome without diarrhea: Secondary | ICD-10-CM

## 2021-09-21 DIAGNOSIS — I10 Essential (primary) hypertension: Secondary | ICD-10-CM

## 2021-09-21 DIAGNOSIS — Z0000 Encounter for general adult medical examination without abnormal findings: Secondary | ICD-10-CM

## 2021-09-21 DIAGNOSIS — J302 Other seasonal allergic rhinitis: Secondary | ICD-10-CM

## 2021-09-21 DIAGNOSIS — E785 Hyperlipidemia, unspecified: Secondary | ICD-10-CM

## 2021-09-21 DIAGNOSIS — F17200 Nicotine dependence, unspecified, uncomplicated: Secondary | ICD-10-CM

## 2021-09-21 DIAGNOSIS — J309 Allergic rhinitis, unspecified: Secondary | ICD-10-CM

## 2021-09-21 LAB — POCT GLYCOSYLATED HEMOGLOBIN (HGB A1C): Hemoglobin A1C: 7.3 % — AB (ref 4.0–5.6)

## 2021-09-21 LAB — GLUCOSE, CAPILLARY: Glucose-Capillary: 151 mg/dL — ABNORMAL HIGH (ref 70–99)

## 2021-09-21 MED ORDER — LISINOPRIL 10 MG PO TABS: 10.0000 mg | ORAL_TABLET | Freq: Every day | ORAL | 0 refills | Status: DC

## 2021-09-21 MED ORDER — HYDROXYZINE PAMOATE 100 MG PO CAPS: 100.0000 mg | ORAL_CAPSULE | Freq: Three times a day (TID) | ORAL | 0 refills | Status: AC | PRN

## 2021-09-21 MED ORDER — CETIRIZINE HCL 10 MG PO TABS: 10.0000 mg | ORAL_TABLET | Freq: Every day | ORAL | 2 refills | Status: DC

## 2021-09-21 MED ORDER — DICYCLOMINE HCL 20 MG PO TABS: 20.0000 mg | ORAL_TABLET | Freq: Three times a day (TID) | ORAL | 0 refills | Status: DC

## 2021-09-21 NOTE — Patient Instructions (Signed)
[name], it was a pleasure seeing you today! ? ?Today we discussed: ?- I have re-ordered your blood pressure, stomach, allergy, and anxiety medications. ? ?- I am going to get lab work today. I will call you with the results. ? ?- Congrats on your A1c! Keep up the great work! ? ?I have ordered the following labs today: ? ? ?Lab Orders    ?     Glucose, capillary    ?     CBC with Diff    ?     BMP8+Anion Gap    ?     Lipid Profile    ?     Microalbumin / Creatinine Urine Ratio    ?     POC Hbg A1C     ? ? ?Referrals ordered today:  ? ? ?Referral Orders    ?     Ambulatory referral to Gynecology     ? ? ?Follow-up: 3 months  ? ?Please make sure to arrive 15 minutes prior to your next appointment. If you arrive late, you may be asked to reschedule.  ? ?We look forward to seeing you next time. Please call our clinic at [phone] if you have any questions or concerns. The best time to call is Monday-Friday from 9am-4pm, but there is someone available 24/7. If after hours or the weekend, call the main hospital number and ask for the Internal Medicine Resident On-Call. If you need medication refills, please notify your pharmacy one week in advance and they will send [place] a request. ? ?Thank you for letting us take part in your care. Wishing you the best! ? ?Thank you, ?Sanjuan Dame, MD ? ?

## 2021-09-22 LAB — LIPID PANEL
Chol/HDL Ratio: 8.3 ratio — ABNORMAL HIGH (ref 0.0–4.4)
Cholesterol, Total: 267 mg/dL — ABNORMAL HIGH (ref 100–199)
HDL: 32 mg/dL — ABNORMAL LOW (ref >39)
LDL Chol Calc (NIH): 196 mg/dL — ABNORMAL HIGH (ref 0–99)
Triglycerides: 201 mg/dL — ABNORMAL HIGH (ref 0–149)
VLDL Cholesterol Cal: 39 mg/dL (ref 5–40)

## 2021-09-22 LAB — CBC WITH DIFFERENTIAL/PLATELET
Basophils Absolute: 0.1 10*3/uL (ref 0.0–0.2)
Basos: 1 %
EOS (ABSOLUTE): 0.4 10*3/uL (ref 0.0–0.4)
Eos: 3 %
Hematocrit: 49.4 % — ABNORMAL HIGH (ref 34.0–46.6)
Hemoglobin: 17 g/dL — ABNORMAL HIGH (ref 11.1–15.9)
Immature Grans (Abs): 0 10*3/uL (ref 0.0–0.1)
Immature Granulocytes: 0 %
Lymphocytes Absolute: 3.1 10*3/uL (ref 0.7–3.1)
Lymphs: 25 %
MCH: 30.4 pg (ref 26.6–33.0)
MCHC: 34.4 g/dL (ref 31.5–35.7)
MCV: 88 fL (ref 79–97)
Monocytes Absolute: 0.7 10*3/uL (ref 0.1–0.9)
Monocytes: 6 %
Neutrophils Absolute: 7.9 10*3/uL — ABNORMAL HIGH (ref 1.4–7.0)
Neutrophils: 65 %
Platelets: 464 10*3/uL — ABNORMAL HIGH (ref 150–450)
RBC: 5.6 x10E6/uL — ABNORMAL HIGH (ref 3.77–5.28)
RDW: 12.1 % (ref 11.7–15.4)
WBC: 12.2 10*3/uL — ABNORMAL HIGH (ref 3.4–10.8)

## 2021-09-22 LAB — BMP8+ANION GAP
Anion Gap: 18 mmol/L (ref 10.0–18.0)
BUN/Creatinine Ratio: 17 (ref 12–28)
BUN: 8 mg/dL (ref 8–27)
CO2: 23 mmol/L (ref 20–29)
Calcium: 9.8 mg/dL (ref 8.7–10.3)
Chloride: 101 mmol/L (ref 96–106)
Creatinine, Ser: 0.47 mg/dL — ABNORMAL LOW (ref 0.57–1.00)
Glucose: 134 mg/dL — ABNORMAL HIGH (ref 70–99)
Potassium: 4.6 mmol/L (ref 3.5–5.2)
Sodium: 142 mmol/L (ref 134–144)
eGFR: 109 mL/min/{1.73_m2} (ref >59)

## 2021-09-22 NOTE — Progress Notes (Signed)
? ?CC: diabetes follow-up ? ?HPI: ? ?VanessaVanessa [name] is a 61 y.o. person with medical history as below presenting to [facility] for diabetes follow-up. ? ?Please see problem-based list for further details, assessments, and plans. ? ?Past Medical History:  ?Diagnosis Date  ? Asthma   ? No PFT performed  ? Blood transfusion without reported diagnosis   ? has to donate blood due to having "thick" blood.  ? COPD (chronic obstructive pulmonary disease) ([place])   ? Depression   ? Diabetes mellitus 2002  ? GERD (gastroesophageal reflux disease)   ? Helicobacter pylori (H. pylori) infection   ? s/p triple therapy  ? Hepatic hemangioma   ? Hyperlipidemia   ? Hypertriglyceridemia   ? Panic attacks   ? mostly [name]  ? Ventral hernia   ? ?Review of Systems:  As per HPI ? ?Physical Exam: ? ?Vitals:  ? 09/21/21 1439 09/21/21 1524  ?BP: (!) 145/48 139/61  ?Pulse: 86 79  ?Temp: 98 ?F (36.7 ?C)   ?TempSrc: Oral   ?SpO2: 90%   ?Weight: 155 lb 14.4 oz (70.7 kg)   ?Height: '5\' 2"'$  (1.575 m)   ? ?General: Resting comfortably in no acute distress ?HENT: Normocephalic, atraumatic. L ear canal without significant cerumen, no signs of active inflammation. Ear drum in tact. ?CV: Regular rate, rhythm. No murmurs appreciated. ?Pulm: Normal respiratory effort on room air. Clear to ausculation bilaterally. ?MSK: Normal bulk, tone. No pitting edema bilateral lower extremities.  ?Skin: Warm, dry. No rashes or lesions. ?Neuro: Awake, alert, conversing appropriately. ?Psych: Normal mood, affect, speech. ? ?Assessment & Plan:  ? ?Pelvic mass ?Patient was seen by her gynecologist in November of 2021 for large ovarian cystic mass. There were plans for mini laparotomy with BSO possible staging with hysterectomy with an umbilical hernia repair, however given her co-morbidities this has been pushed back. She was told she needed better control of her diabetes and polycythemia before proceeding with surgery. She currently denies fevers, weight changes, night  sweats. We will re-check A1c and CBC today. In addition, she reports her previous gynecologist is no longer practicing and is requesting a new referral. ? ?- Follow-up A1c, CBC ?- Referral gynecologist today ? ?Healthcare maintenance ?- Patient denied foot exam today.  ?- Will hold off PAP today, placing gyn referral ?- Ophthalmology referral placed ? ?Polycythemia, secondary ?Patient has not had phlebotomy in about a year. She continues to smoke a pack daily. We discussed smoking cessation, she is not currently interested. I have recommended she return to Dr. Alvy Bimler and repeat phlebotomy.  ? ?- Repeat CBC today ? ?HLD (hyperlipidemia) ?Patient reports compliance with atorvastatin. We will repeat lipid panel today. ? ?ADDENDUM: ?LDL worsening, 196 today from 170 one year ago. I have re-iterated the importance of compliance of atorvastatin and will add ezetimibe today. Plan to repeat lipid panel in 29mo22yr, can consider PSCK9i if continues to be uncontrolled. ? ?- Atorvastatin '40mg'$  daily ?- Ezetimibe '10mg'$  daily ? ?Anxiety and depression ?Patient has had difficult time recently, as her husband recently had a leg amputation. This has put a tremendous amount of pressure on Vanessa [name] having to do all the chores around the house in addition to helping him with his ADL's/IADL's. She endorses frequently feeling overwhelmed and feeling on edge. She has been compliant with daily Celexa, but is requesting to re-start hydroxyzine three times daily. She denies any active SI or HI today.  ? ?Will continue with daily SSRI and add hydroxyzine as needed.  Can consider switching SSRI at next visit if continues to be very anxious. ? ?- Celexa '40mg'$  daily ?- Hydroxyzine '100mg'$  three times daily as needed ? ?Tobacco dependence ?Vanessa [name] reports she is still smoking 1 ppd. She is currently not interested in cessation. Will continue to monitor.  ? ?Diabetes mellitus, type II, insulin dependent ([place]) ?Vanessa [name] is returning to  clinic to discuss diabetes. Her last A1c 9.4% one year ago. Since this time, patient reports she has not been taking her insulin regularly. The last time she took her insulin was a few months ago. She denies polyuria, polydipsia, change in chronic neuropathy. 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  ? ?A1c 7.3% today. I congratulated Vanessa [name] on successful lowering of her sugars. She is not interested in re-starting metformin or SGLT2i. We will continue with lifestyle modifications and re-check A1c in three months. ? ?- Re-check A1c in three months ?- BMP today ?- Pending urine microalbumin/creatinine ratio ?- Ophthalmology referral ? ?Essential hypertension, benign ?Patient has not been taking her antihypertensive and is requesting a re-fill. She denies chest pain, dizziness, dyspnea. ? ?- Continue lisinopril '10mg'$  daily ?- BMP today ? ?Seasonal allergies ?Vanessa [name] reports she suffers from seasonal allergies, specifically intermittent left ear pain, watery eyes, sore throat. She [name] she was previously well-controlled on Zyrtec. She denies fevers, chills, body aches, cough, hearing loss, tinnitus, vision changes.  ? ?- Cetirizine '10mg'$  daily ? ? ?Patient discussed with Dr. Daryll Drown ? ?Sanjuan Dame, MD ?Internal Medicine PGY-2 ?Pager: [phone] ? ?

## 2021-09-23 DIAGNOSIS — J302 Other seasonal allergic rhinitis: Secondary | ICD-10-CM | POA: Insufficient documentation

## 2021-09-23 MED ORDER — EZETIMIBE 10 MG PO TABS: 10.0000 mg | ORAL_TABLET | Freq: Every day | ORAL | 2 refills | Status: DC

## 2021-09-23 NOTE — Assessment & Plan Note (Addendum)
Patient reports compliance with atorvastatin. We will repeat lipid panel today. ? ?ADDENDUM: ?LDL worsening, 196 today from 170 one year ago. I have re-iterated the importance of compliance of atorvastatin and will add ezetimibe today. Plan to repeat lipid panel in 58mo61yr, can consider PSCK9i if continues to be uncontrolled. ? ?- Atorvastatin '40mg'$  daily ?- Ezetimibe '10mg'$  daily ?

## 2021-09-23 NOTE — Assessment & Plan Note (Signed)
Vanessa [name] reports she is still smoking 1 ppd. She is currently not interested in cessation. Will continue to monitor.  ?

## 2021-09-23 NOTE — Assessment & Plan Note (Signed)
-   Patient denied foot exam today.  ?- Will hold off PAP today, placing gyn referral ?- Ophthalmology referral placed ?

## 2021-09-23 NOTE — Assessment & Plan Note (Signed)
Vanessa [name] is returning to clinic to discuss diabetes. Her last A1c 9.4% one year ago. Since this time, patient reports she has not been taking her insulin regularly. The last time she took her insulin was a few months ago. She denies polyuria, polydipsia, change in chronic neuropathy. 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  ? ?A1c 7.3% today. I congratulated Vanessa [name] on successful lowering of her sugars. She is not interested in re-starting metformin or SGLT2i. We will continue with lifestyle modifications and re-check A1c in three months. ? ?- Re-check A1c in three months ?- BMP today ?- Pending urine microalbumin/creatinine ratio ?- Ophthalmology referral ?

## 2021-09-23 NOTE — Assessment & Plan Note (Signed)
Vanessa [name] reports she suffers from seasonal allergies, specifically intermittent left ear pain, watery eyes, sore throat. She mentions she was previously well-controlled on Zyrtec. She denies fevers, chills, body aches, cough, hearing loss, tinnitus, vision changes.  ? ?- Cetirizine '10mg'$  daily ?

## 2021-09-23 NOTE — Assessment & Plan Note (Addendum)
Patient was seen by her gynecologist in November of 2021 for large ovarian cystic mass. There were plans for mini laparotomy with BSO possible staging with hysterectomy with an umbilical hernia repair, however given her co-morbidities this has been pushed back. She was told she needed better control of her diabetes and polycythemia before proceeding with surgery. She currently denies fevers, weight changes, night sweats. We will re-check A1c and CBC today. In addition, she reports her previous gynecologist is no longer practicing and is requesting a new referral. ? ?- Follow-up A1c, CBC ?- Referral gynecologist today ?

## 2021-09-23 NOTE — Assessment & Plan Note (Signed)
Patient has had difficult time recently, as her husband recently had a leg amputation. This has put a tremendous amount of pressure on Ms. Vanessa [name]. Mentions having to do all the chores around the house in addition to helping him with his ADL's/IADL's. She endorses frequently feeling overwhelmed and feeling on edge. She has been compliant with daily Celexa, but is requesting to re-start hydroxyzine three times daily. She denies any active SI or HI today.  ? ?Will continue with daily SSRI and add hydroxyzine as needed. Can consider switching SSRI at next visit if continues to be very anxious. ? ?- Celexa '40mg'$  daily ?- Hydroxyzine '100mg'$  three times daily as needed ?

## 2021-09-23 NOTE — Assessment & Plan Note (Signed)
Patient has not been taking her antihypertensive and is requesting a re-fill. She denies chest pain, dizziness, dyspnea. ? ?- Continue lisinopril '10mg'$  daily ?- BMP today ?

## 2021-09-23 NOTE — Assessment & Plan Note (Signed)
Patient has not had phlebotomy in about a year. She continues to smoke a pack daily. We discussed smoking cessation, she is not currently interested. I have recommended she return to Dr. Alvy Bimler and repeat phlebotomy.  ? ?- Repeat CBC today ?

## 2021-09-27 IMAGING — CT CT ABD-PELV W/ CM
2 of 5 series · 15 of 46 positions shown, 17 images · IV contrast (OMNIPAQUE)
Comparison: December 14, 2015

CLINICAL DATA: Abdominal distension, large cystic abdominal mass on
ultrasound

EXAM:
CT ABDOMEN AND PELVIS WITH CONTRAST
TECHNIQUE: Multidetector CT imaging of the abdomen and pelvis was performed
using the standard protocol following bolus administration of
intravenous contrast.
CONTRAST:  100mL OMNIPAQUE IOHEXOL 300 MG/ML  SOLN

[Series 2: axial st · axial · 0.93mm/px · z∈[+1029,+1439]mm · 12 of 96 slices shown, 14 images]
[im 7/96  soft-tissue]
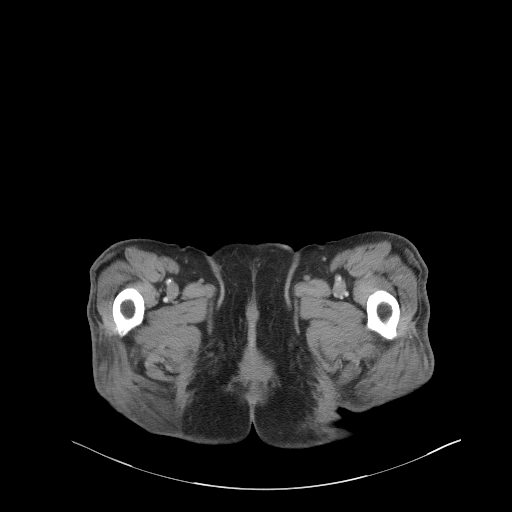
[im 7/96  bone]
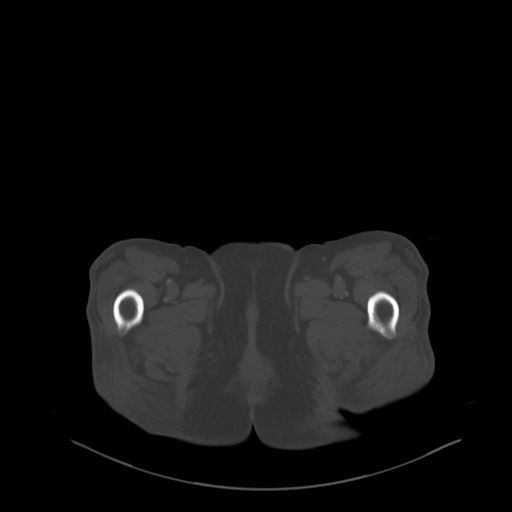
[im 14/96  soft-tissue]
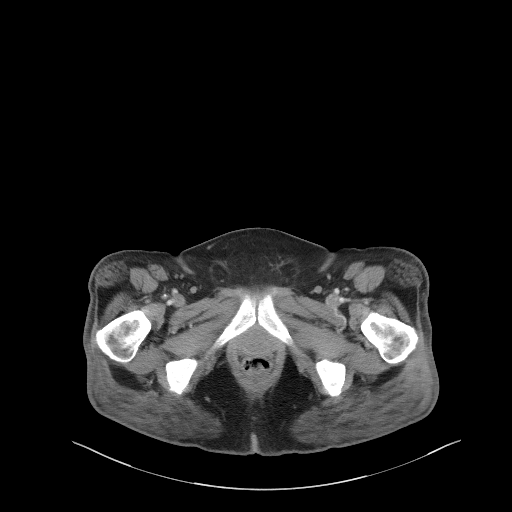
[im 21/96  soft-tissue]
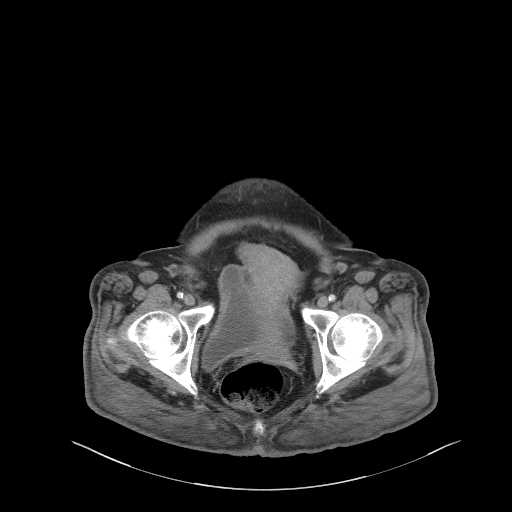
[im 28/96  soft-tissue]
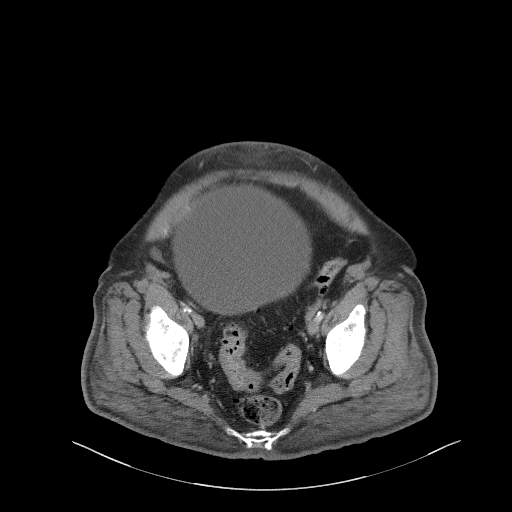
[im 34/96  soft-tissue]
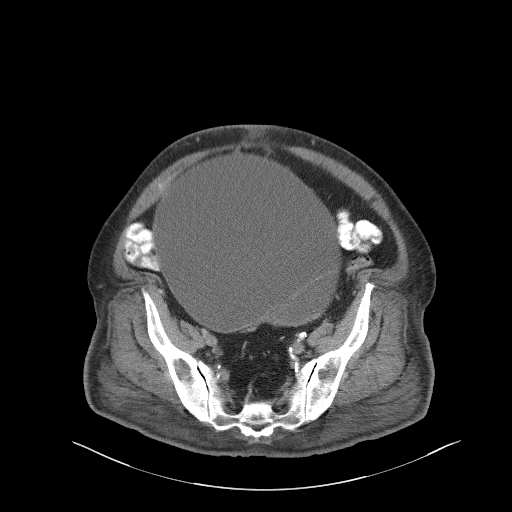
[im 41/96  soft-tissue]
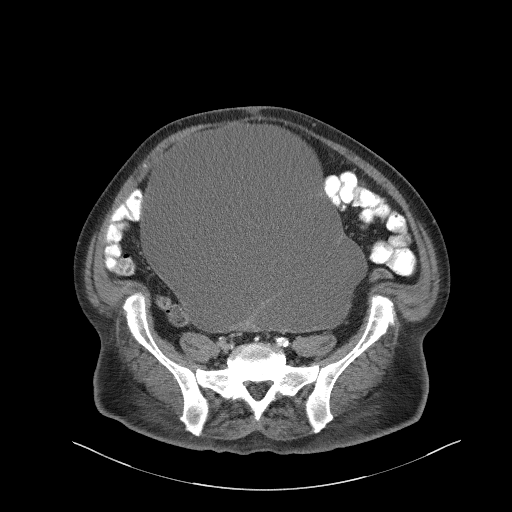
[im 55/96  soft-tissue]
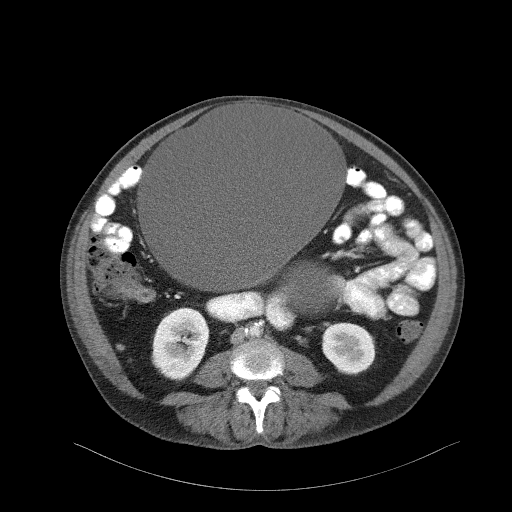
[im 62/96  soft-tissue]
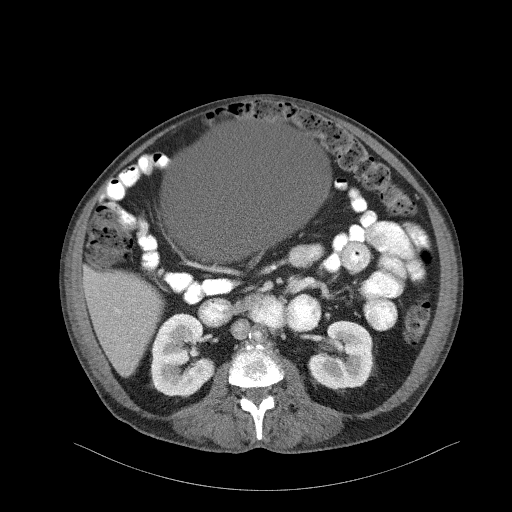
[im 68/96  soft-tissue]
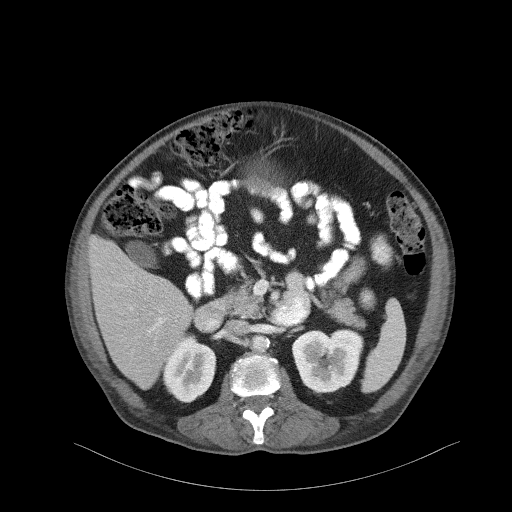
[im 68/96  bone]
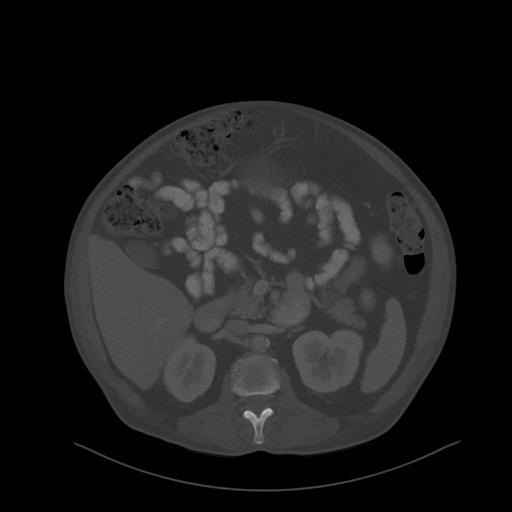
[im 75/96  soft-tissue]
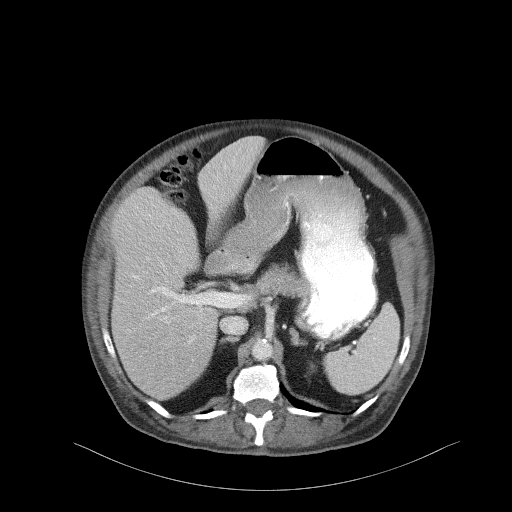
[im 82/96  soft-tissue]
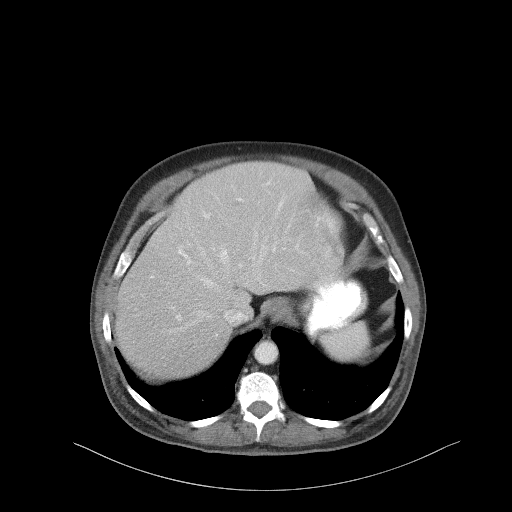
[im 89/96  soft-tissue]
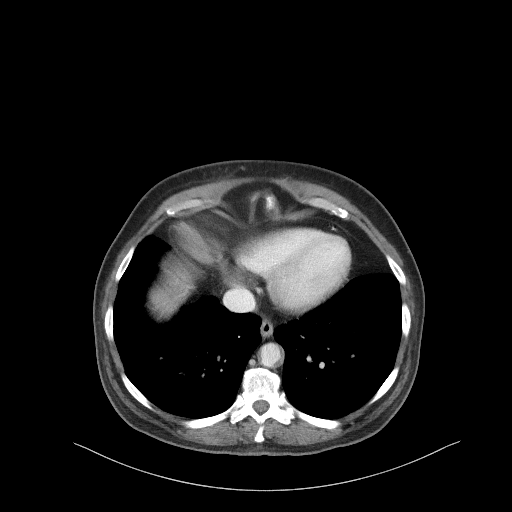

[Series 4: coronal st · coronal · 1.00mm/px · 3 of 113 slices shown]
[im 38/113  soft-tissue]
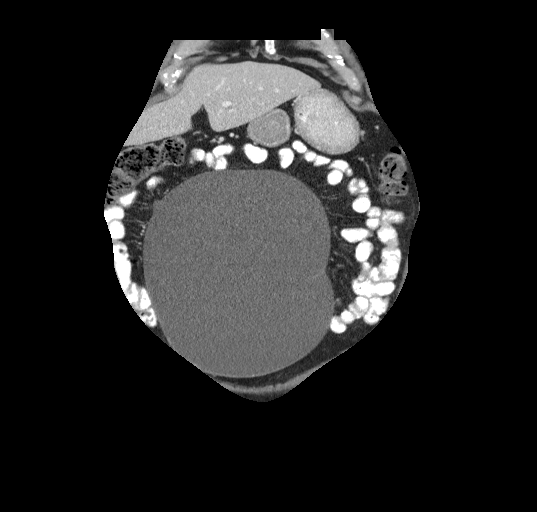
[im 50/113  soft-tissue]
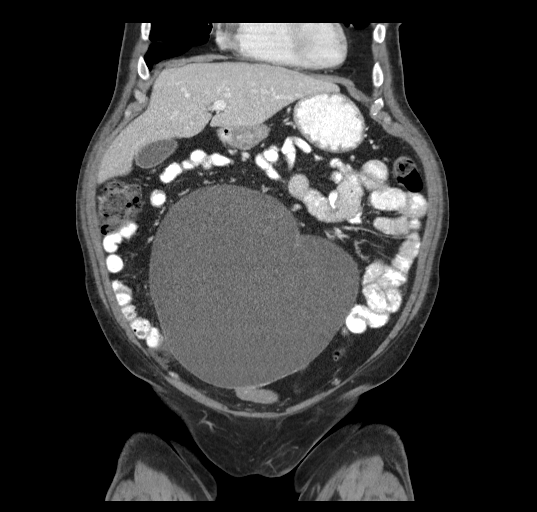
[im 63/113  soft-tissue]
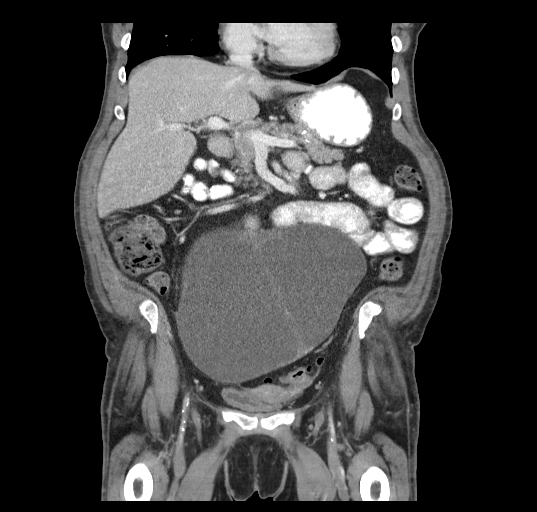

[15 of 46 positions shown; findings below may reference images not displayed]

FINDINGS: Lower chest: Is is is mild ground-glass and septal thickening at the
lung bases. Small LEFT lower lobe pulmonary nodule 5 mm (image 5,
series 6) no consolidation. No pleural effusion.

Hepatobiliary: No focal, suspicious hepatic lesion. Portal vein is
patent. No pericholecystic stranding or sign of biliary duct
dilation.

Pancreas: Normal

Spleen: Normal

Adrenals/Urinary Tract: LEFT adrenal nodule 2.1 x 2.1 cm, density
less than 130 Hounsfield units, slightly enlarged compared to the
previous study where it measured approximately 1.7 x 1.7 cm. RIGHT
adrenal is normal. Symmetric renal enhancement. Is no
hydronephrosis. Urinary bladder under distended

Stomach/Bowel: Gastrointestinal tract without signs of acute
process. Normal appendix. Bowel is displaced by a large mass arising
from the pelvis. See below. Diverticular disease of the sigmoid
colon.

Vascular/Lymphatic: No adenopathy in the pelvis largest lymph node
along the RIGHT pelvic sidewall, 8 mm. Marked atherosclerotic plaque
in the abdominal aorta both calcified and noncalcified without signs
of aneurysmal dilation. No retroperitoneal adenopathy.

Reproductive: Cystic mass arises from the pelvis, appears to arise
from RIGHT ovary based on tracking of the ovarian vasculature into
the more "solid" portion of this lesion which is a rim of soft
tissue along the posterolateral margin on the LEFT that measures
approximately 6 mm greatest thickness along approximately 4 cm of
length of the lesion. This cystic mass in total measuring 23 x 18 by
approximately 21 cm extending from the pelvis into the low abdomen.

Small amount of fluid along the RIGHT lateral margin (image 58,
series 2)

Aside from the dependent soft tissue density described above there
is no frank nodularity visible within the lesion on CT.

No ascites in the upper abdomen. Small "tendrils" extending from the
lesion into the fat of the omentum along the anterior margin (image
61 of series 2) these areas are less than a cm and are quite subtle

Other: Small amount of fluid adjacent to the lesion as described. No
ascites in the upper abdomen.

Musculoskeletal: No acute bone finding. No destructive bone process.
IMPRESSION: 1. Large cystic ovarian neoplasm arises from the RIGHT ovary with
signs of potential very early peritoneal involvement as described.
2. Small LEFT lower lobe pulmonary nodule is nonspecific. Question
of septal thickening which is mildly irregular though with limited
assessment. This may be due to infection or inflammation. Consider
dedicated imaging of the chest for full assessment as the irregular
appearance of some of the septations could be seen in the setting of
lymphangitic carcinomatosis, which would be unlikely given the
appearance of this cystic ovarian lesion.
3. LEFT adrenal nodule, slightly enlarged compared to the previous
study where it measured approximately 1.7 x 1.7 cm. This may
represent an adenoma. Would suggest adrenal protocol CT for further
evaluation. Given relative stability since 3146 this is likely an
adrenal adenoma but in the current context of suspected neoplasm
further assessment is suggested. Enlargement less than 0.5 cm is
encouraging.
4. Marked atherosclerotic plaque in the abdominal aorta.

Aortic Atherosclerosis ([id number]).

## 2021-10-02 ENCOUNTER — Other Ambulatory Visit: Payer: Self-pay | Admitting: Student

## 2021-10-02 DIAGNOSIS — J449 Chronic obstructive pulmonary disease, unspecified: Secondary | ICD-10-CM

## 2021-10-06 ENCOUNTER — Telehealth: Payer: Self-pay | Admitting: Student

## 2021-10-06 NOTE — Telephone Encounter (Signed)
Pt is calling to f/u on her Gyn Referral from her OV 09/21/2021.The pt's referral was placed and sent to the [facility] for her Gyn Referral.  ? ?Pt called back to  clarify her Referral Request.  The patient states her  Oncology GYN Dr. Has retired and she needs to be Referred the Cancer IT sales professional not a regular GYN. ? ?Per previous visit she was last seen for a Pelvic Mass on 05/12/2020 with their clinic. ? ? ?Please advise if a New Referral can be placed to [facility]. ?

## 2021-10-06 NOTE — Addendum Note (Signed)
Addended bySanjuan Dame on: 10/06/2021 08:14 PM ? ? Modules accepted: Orders ? ?

## 2021-10-11 NOTE — Progress Notes (Signed)
Internal Medicine Clinic Attending ? ?Case discussed with Dr. Braswell  at the time of the visit.  We reviewed the resident?s history and exam and pertinent patient test results.  I agree with the assessment, diagnosis, and plan of care documented in the resident?s note.  ?

## 2021-10-12 ENCOUNTER — Telehealth: Payer: Self-pay

## 2021-10-12 NOTE — Telephone Encounter (Signed)
Called patient and scheduled apt for 10/31/21 @ 4pm ?

## 2021-10-28 ENCOUNTER — Encounter: Payer: Self-pay | Admitting: Gynecologic Oncology

## 2021-10-31 ENCOUNTER — Encounter: Payer: Self-pay | Admitting: Gynecologic Oncology

## 2021-10-31 ENCOUNTER — Inpatient Hospital Stay: Payer: 59 | Attending: Gynecologic Oncology | Admitting: Gynecologic Oncology

## 2021-10-31 ENCOUNTER — Other Ambulatory Visit: Payer: Self-pay

## 2021-10-31 VITALS — BP 146/51 | HR 108 | Temp 98.5°F | Resp 18 | Ht 62.0 in | Wt 159.0 lb

## 2021-10-31 DIAGNOSIS — R970 Elevated carcinoembryonic antigen [CEA]: Secondary | ICD-10-CM | POA: Diagnosis not present

## 2021-10-31 DIAGNOSIS — R109 Unspecified abdominal pain: Secondary | ICD-10-CM | POA: Insufficient documentation

## 2021-10-31 DIAGNOSIS — D751 Secondary polycythemia: Secondary | ICD-10-CM | POA: Diagnosis not present

## 2021-10-31 DIAGNOSIS — K429 Umbilical hernia without obstruction or gangrene: Secondary | ICD-10-CM | POA: Diagnosis not present

## 2021-10-31 DIAGNOSIS — E7800 Pure hypercholesterolemia, unspecified: Secondary | ICD-10-CM | POA: Diagnosis not present

## 2021-10-31 DIAGNOSIS — E785 Hyperlipidemia, unspecified: Secondary | ICD-10-CM | POA: Insufficient documentation

## 2021-10-31 DIAGNOSIS — R350 Frequency of micturition: Secondary | ICD-10-CM | POA: Insufficient documentation

## 2021-10-31 DIAGNOSIS — R1900 Intra-abdominal and pelvic swelling, mass and lump, unspecified site: Secondary | ICD-10-CM | POA: Insufficient documentation

## 2021-10-31 DIAGNOSIS — F17210 Nicotine dependence, cigarettes, uncomplicated: Secondary | ICD-10-CM | POA: Insufficient documentation

## 2021-10-31 DIAGNOSIS — J449 Chronic obstructive pulmonary disease, unspecified: Secondary | ICD-10-CM | POA: Diagnosis not present

## 2021-10-31 DIAGNOSIS — K219 Gastro-esophageal reflux disease without esophagitis: Secondary | ICD-10-CM | POA: Diagnosis not present

## 2021-10-31 DIAGNOSIS — F32A Depression, unspecified: Secondary | ICD-10-CM | POA: Diagnosis not present

## 2021-10-31 DIAGNOSIS — E1121 Type 2 diabetes mellitus with diabetic nephropathy: Secondary | ICD-10-CM | POA: Diagnosis not present

## 2021-10-31 DIAGNOSIS — R140 Abdominal distension (gaseous): Secondary | ICD-10-CM | POA: Insufficient documentation

## 2021-10-31 DIAGNOSIS — Z79899 Other long term (current) drug therapy: Secondary | ICD-10-CM | POA: Diagnosis not present

## 2021-10-31 DIAGNOSIS — I10 Essential (primary) hypertension: Secondary | ICD-10-CM | POA: Diagnosis not present

## 2021-10-31 DIAGNOSIS — Z720 Tobacco use: Secondary | ICD-10-CM

## 2021-10-31 NOTE — Patient Instructions (Addendum)
Plan to have a CT scan to re-evaluate the pelvic mass seen on previous imaging. We will also plan to order lab work (tumor markers) today and will contact you with the results. We will also work on getting you back to meet with Dr. Alvy Bimler for treatment of your polycythemia (high blood levels) prior to surgery. Continue your efforts on controlling your blood sugar.  ?

## 2021-10-31 NOTE — H&P (View-Only) (Signed)
Gynecologic Oncology Return Clinic Visit  10/31/2021  Reason for Visit: Reestablishing care in the setting of a large abdominopelvic mass  Treatment History: The patient reported noting progressive abdominal distention throughout the summer and fall 2021.  She had been told that this was possibly an umbilical hernia and therefore she saw Dr.Toth who thought that she most likely had an abdominal diastases and recommended CT scan of the abdomen and pelvis which was scheduled for May 25, 2020.   In the meantime she scheduled a routine annual surveillance visit with her gynecologist, Dr. Ronita Hipps, and at that time the abdominopelvic mass was appreciated on examination and she received a transvaginal ultrasound scan with abdominal component that was administered in the office on 05/05/2020.  This revealed a uterus that was poorly seen.  The left and right ovaries could not be discretely seen.  However there was a cystic complex mass beginning above the umbilicus and extending down into both adnexa is.  It measured 22 x 17 x 18.6 cm.  There is no increased blood flow seen.   CEA was drawn at that visit which was mildly elevated at 6.4.  The Ca1 25 drawn on that same day was normal at 19.3.   The patient was told that she needed surgery and therefore canceled her CT scan that had been scheduled for late November.   Review of prior scans from 2014 show that she had a 7 cm simple appearing cystic mass in the adnexa seen incidentally on the 2014 CT scan there was performed for back pain and identified renal calculus.  She had normal tumor markers and stable findings on repeat imaging in 2017 and 2018.  She had been recommended to continue 6 monthly follow-ups and had failed to present for these on a regular basis.   The patient has multiple medical comorbidities including poorly controlled diabetes mellitus for which she uses insulin.  Her most recent hemoglobin A1c was on 03/11/2020 and was 9.4%.  Following  that visit, her PCP Dr. Collene Gobble, adjusted her insulin.  She fails to take her blood glucose levels regularly at home.  Her diabetes is complicated by peripheral neuropathy.   She has hypercholesterolemia and hypertension.  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  She is a tobacco abuser and smokes more than 1 pack/day.  This is complicated by COPD.     Her performance status is fairly good per patient reporting.  She reports some mild shortness of breath when scaling stairs but can ambulate within the house without shortness of breath.  She does not use oxygen at home and has not been recommended to do so.  She denies chest pain or lower extremity claudication symptoms.   Her last colonoscopy was with Dr. Henrene Pastor in 2018 which revealed multiple polyps which were removed with pathology benign.  The patient had an updated colonoscopy performed at the end of 2021 showing multiple diverticula within the sigmoid colon, otherwise normal exam.  The patient was previously seeing Dr. 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  She was ultimately scheduled for surgery previously with phlebotomy prior to lower her hemoglobin prior to surgery.  She called before surgery but unfortunately because of a temperature at the time that she went to see her primary care provider, has not had any adjustment to her diabetes medications.  She also was found to still have a hemoglobin of 16.6 and commendation was made to  delay surgery.  Unfortunately, the patient did not come to multiple appointments for phlebotomy in order to get her hemoglobin down in anticipation of surgery.  She has been lost to follow-up within our system since April 2022.  Interval History: Today, presents noting that she has had increasing abdominal girth, abdominal pain and discomfort.  Despite being hungry, she can often not eat more than 1 meal a day and often gets full  after only a couple of bites.  She reports regular bowel function.  Has increased pelvic pressure and urinary frequency.  Denies any vaginal bleeding or discharge.  Continues to have low back pain.  Last POC Hgb A1c was 7.3% in March.  Notes that she was taken off of her diabetes medication by her primary care provider.  She is watching her diet.  Does not check her blood sugar at home.  CBC in March showed Hgb 17, Hct 49.4.   He continues to smoke 2 packs of cigarettes per day.  Past Medical/Surgical History: Past Medical History:  Diagnosis Date   Asthma    No PFT performed   Blood transfusion without reported diagnosis    has to donate blood due to having "thick" blood.   COPD (chronic obstructive pulmonary disease) (HCC)    Depression    Diabetes mellitus 2002   GERD (gastroesophageal reflux disease)    Helicobacter pylori (H. pylori) infection    s/p triple therapy   Hepatic hemangioma    Hyperlipidemia    Hypertriglyceridemia    Panic attacks    mostly Agaraphobia   Ventral hernia     Past Surgical History:  Procedure Laterality Date   CESAREAN SECTION     with 3 children   COLONOSCOPY  2018   OVARIAN CYST REMOVAL     Age 64   TUBAL LIGATION     UPPER GASTROINTESTINAL ENDOSCOPY  2018    Family History  Problem Relation Age of Onset   Colon polyps Mother        benign   Ovarian cancer Mother    Breast cancer Maternal Aunt    Diabetes Maternal Uncle    Colon cancer Neg Hx    Stomach cancer Neg Hx    Endometrial cancer Neg Hx    Prostate cancer Neg Hx    Pancreatic cancer Neg Hx    Esophageal cancer Neg Hx    Rectal cancer Neg Hx     Social History   Socioeconomic History   Marital status: Married    Spouse name: Not on file   Number of children: Not on file   Years of education: Not on file   Highest education level: Not on file  Occupational History   Not on file  Tobacco Use   Smoking status: Every Day    Packs/day: 1.50    Years: 43.00     Pack years: 64.50    Types: Cigarettes   Smokeless tobacco: Never  Vaping Use   Vaping Use: Never used  Substance and Sexual Activity   Alcohol use: No    Alcohol/week: 0.0 standard drinks    Comment: none   Drug use: No   Sexual activity: Not Currently  Other Topics Concern   Not on file  Social History Narrative   Drinks at least a pot of coffee every day.    Social Determinants of Health   Financial Resource Strain: Not on file  Food Insecurity: Not on file  Transportation Needs: Not on file  Physical  Activity: Not on file  Stress: Not on file  Social Connections: Not on file    Current Medications:  Current Outpatient Medications:    acetaminophen (TYLENOL) 500 MG tablet, Take 1,000 mg by mouth every 6 (six) hours as needed for mild pain., Disp: , Rfl:    ADVAIR DISKUS 250-50 MCG/ACT AEPB, INHALE 1 INHALATION BY  MOUTH INTO THE LUNGS IN THE MORNING AND AT BEDTIME, Disp: 180 each, Rfl: 3   albuterol (VENTOLIN HFA) 108 (90 Base) MCG/ACT inhaler, INHALE 1 TO 2 PUFFS BY MOUTH EVERY 6 HOURS AS NEEDED FOR WHEEZING FOR SHORTNESS OF BREATH, Disp: 9 g, Rfl: 11   ALPRAZolam (XANAX) 0.5 MG tablet, TAKE 1 TABLET BY MOUTH AT BEDTIME AS NEEDED FOR ANXIETY (Patient taking differently: Take 0.5 mg by mouth at bedtime as needed for anxiety. TAKE 1 TABLET BY MOUTH AT BEDTIME AS NEEDED FOR ANXIETY), Disp: 30 tablet, Rfl: 0   aspirin 81 MG tablet, Take 1 tablet (81 mg total) by mouth daily., Disp: 30 tablet, Rfl: 2   atorvastatin (LIPITOR) 40 MG tablet, Take 40 mg by mouth daily., Disp: , Rfl:    cetirizine (ZYRTEC) 10 MG tablet, Take 1 tablet (10 mg total) by mouth daily., Disp: 30 tablet, Rfl: 2   cimetidine (TAGAMET) 200 MG tablet, Take 200 mg by mouth 2 (two) times daily., Disp: , Rfl:    citalopram (CELEXA) 40 MG tablet, Take 1 tablet (40 mg total) by mouth daily., Disp: 90 tablet, Rfl: 0   dicyclomine (BENTYL) 20 MG tablet, Take 1 tablet (20 mg total) by mouth 4 (four) times daily -   before meals and at bedtime., Disp: 90 tablet, Rfl: 0   ezetimibe (ZETIA) 10 MG tablet, Take 1 tablet (10 mg total) by mouth daily., Disp: 90 tablet, Rfl: 2   glucose blood test strip, Use as instructed, Disp: 100 each, Rfl: 12   ibuprofen (ADVIL,MOTRIN) 200 MG tablet, Take 600 mg by mouth every 6 (six) hours as needed for moderate pain., Disp: , Rfl:    tiotropium (SPIRIVA HANDIHALER) 18 MCG inhalation capsule, PLACE ONE CAPSULE INTO INHALER AND INHALE DAILY. Strength: 18 mcg, Disp: 30 capsule, Rfl: 0   fluticasone (FLONASE) 50 MCG/ACT nasal spray, Place 2 sprays into both nostrils daily. (Patient not taking: Reported on 06/17/2020), Disp: 15.8 mL, Rfl: 2   lisinopril (ZESTRIL) 10 MG tablet, Take 1 tablet (10 mg total) by mouth daily., Disp: 30 tablet, Rfl: 0  Review of Systems: Denies appetite changes, fevers, chills, fatigue, unexplained weight changes. Denies hearing loss, neck lumps or masses, mouth sores, ringing in ears or voice changes. Denies cough or wheezing.  Denies shortness of breath. Denies chest pain or palpitations. Denies leg swelling. Denies abdominal distention, pain, blood in stools, constipation, diarrhea, nausea, vomiting, or early satiety. Denies pain with intercourse, dysuria, frequency, hematuria or incontinence. Denies hot flashes, pelvic pain, vaginal bleeding or vaginal discharge.   Denies joint pain, back pain or muscle pain/cramps. Denies itching, rash, or wounds. Denies dizziness, headaches, numbness or seizures. Denies swollen lymph nodes or glands, denies easy bruising or bleeding. Denies anxiety, depression, confusion, or decreased concentration.  Physical Exam: BP (!) 146/51 (BP Location: Right Arm, Patient Position: Sitting)   Pulse (!) 108   Temp 98.5 F (36.9 C) (Oral)   Resp 18   Ht '5\' 2"'$  (1.575 m)   Wt 159 lb (72.1 kg)   LMP 12/24/2013   SpO2 95%   BMI 29.08 kg/m  General: Alert, oriented, no  acute distress. HEENT: Normocephalic, atraumatic,  sclera anicteric. Chest: Clear to auscultation bilaterally.  Bilateral expiratory wheezes. Cardiovascular: Regular rate and rhythm, no murmurs. Abdomen: Abdomen is markedly enlarged, distended.  Normoactive bowel sounds.  Mass fills the pelvis and much of the upper abdomen, nonmobile, smooth.   Extremities: Grossly normal range of motion.  Warm, well perfused.  No edema bilaterally. Skin: No rashes or lesions noted. Lymphatics: No cervical, supraclavicular, or inguinal adenopathy. GU: Normal appearing external genitalia without erythema, excoriation, or lesions.  Speculum exam reveals mildly atrophic vaginal mucosa.  Bimanual exam reveals enlarged smooth mass, fills the pelvis and spans at least 8-10 cm above the umbilicus.  Minimal mobility given size.    Laboratory & Radiologic Studies: CBC    Component Value Date/Time   WBC 12.2 (H) 09/21/2021 1543   WBC 11.5 (H) 10/08/2020 1148   RBC 5.60 (H) 09/21/2021 1543   RBC 5.30 (H) 10/08/2020 1148   HGB 17.0 (H) 09/21/2021 1543   HCT 49.4 (H) 09/21/2021 1543   PLT 464 (H) 09/21/2021 1543   MCV 88 09/21/2021 1543   MCH 30.4 09/21/2021 1543   MCH 28.7 10/08/2020 1148   MCHC 34.4 09/21/2021 1543   MCHC 32.9 10/08/2020 1148   RDW 12.1 09/21/2021 1543   LYMPHSABS 3.1 09/21/2021 1543   MONOABS 0.7 10/08/2020 1148   EOSABS 0.4 09/21/2021 1543   BASOSABS 0.1 09/21/2021 1543      Latest Ref Rng & Units 09/21/2021    3:43 PM 05/12/2020   12:20 PM 03/11/2020    4:44 PM  CMP  Glucose 70 - 99 mg/dL 134   177   97    BUN 8 - 27 mg/dL '8   6   8    '$ Creatinine 0.57 - 1.00 mg/dL 0.47   0.67   0.47    Sodium 134 - 144 mmol/L 142   137   137    Potassium 3.5 - 5.2 mmol/L 4.6   4.3   4.7    Chloride 96 - 106 mmol/L 101   103   97    CO2 20 - 29 mmol/L '23   27   22    '$ Calcium 8.7 - 10.3 mg/dL 9.8   9.3   10.1    Total Protein 6.5 - 8.1 g/dL  7.1     Total Bilirubin 0.3 - 1.2 mg/dL  0.4     Alkaline Phos 38 - 126 U/L  120     AST 15 - 41 U/L  9      ALT 0 - 44 U/L  7        Assessment & Plan: Vanessa [name] is a 61 y.o. woman with a large abdominopelvic mass.   Patient is representing today to establish care.  She continues to have significant symptoms related to her large abdominal pelvic mass.  We discussed findings from imaging, which is a year and a half old now, that show a large mostly cystic mass that appears to be coming from one of her ovaries.  We overall suspicion for malignancy is low and I recommend that we proceed with surgery for diagnostic and therapeutic purposes.  The patient was noted to have a mildly elevated CEA at the time of her initial work-up.  She has subsequently undergone colonoscopy which was negative with the exception of some diverticula.  We will plan to repeat a CEA.  Given interval since her last imaging, I am recommending proceeding with CT scan  of the abdomen and pelvis for surgical planning.  This will help reassess the size as well as features of the mass that may increase her lower suspicion for malignancy.  Patient's diabetes is under much better control.  She is off medications now and her last hemoglobin A1c was 7.3%.  I encouraged her to continue with dietary intervention to keep blood sugars low.  Patient was previously seen Dr. Alvy Bimler for her polycythemia.  Given a hemoglobin of 17, I am going to have the patient reevaluated for phlebotomy before surgery to decrease her hemoglobin and risk associated with surgery.  We will await recommendations from Dr. Alvy Bimler in terms of how soon we could get the surgery scheduled.   We will plan to proceed, based on prior imaging and exam findings, with mini laparotomy for controlled/contained cyst drainage as long as imaging continues to support low risk of malignancy.  Discussed recommendation to proceed with robotic BSO after controlled cyst decompression, hysterectomy and staging if indicated by pathology.  Depending on findings at the time of surgery, patient  may also benefit from a primary umbilical hernia repair.  Discussed that her rectus diastases is likely in the setting of prior pregnancies but now her large mass.  36 minutes of total time was spent for this patient encounter, including preparation, face-to-face counseling with the patient and coordination of care, and documentation of the encounter.  Jeral Pinch, MD  Division of Gynecologic Oncology  Department of Obstetrics and Gynecology  [facility]

## 2021-10-31 NOTE — Progress Notes (Signed)
Gynecologic Oncology Return Clinic Visit ? ?10/31/2021 ? ?Reason for Visit: Reestablishing care in the setting of a large abdominopelvic mass ? ?Treatment History: ?The patient reported noting progressive abdominal distention throughout the summer and fall 2021.  She had been told that this was possibly an umbilical hernia and therefore she saw Dr.Toth who thought that she most likely had an abdominal diastases and recommended CT scan of the abdomen and pelvis which was scheduled for May 25, 2020. ?  ?In the meantime she scheduled a routine annual surveillance visit with her gynecologist, Dr. Ronita Hipps, and at that time the abdominopelvic mass was appreciated on examination and she received a transvaginal ultrasound scan with abdominal component that was administered in the office on 05/05/2020.  This revealed a uterus that was poorly seen.  The left and right ovaries could not be discretely seen.  However there was a cystic complex mass beginning above the umbilicus and extending down into both adnexa is.  It measured 22 x 17 x 18.6 cm.  There is no increased blood flow seen. ?  ?CEA was drawn at that visit which was mildly elevated at 6.4.  The Ca1 25 drawn on that same day was normal at 19.3. ?  ?The patient was told that she needed surgery and therefore canceled her CT scan that had been scheduled for late November. ?  ?Review of prior scans from 2014 show that she had a 7 cm simple appearing cystic mass in the adnexa seen incidentally on the 2014 CT scan there was performed for back pain and identified renal calculus.  She had normal tumor markers and stable findings on repeat imaging in 2017 and 2018.  She had been recommended to continue 6 monthly follow-ups and had failed to present for these on a regular basis. ?  ?The patient has multiple medical comorbidities including poorly controlled diabetes mellitus for which she uses insulin.  Her most recent hemoglobin A1c was on 03/11/2020 and was 9.4%.  Following  that visit, her PCP Dr. Collene Gobble, adjusted her insulin.  She fails to take her blood glucose levels regularly at home.  Her diabetes is complicated by peripheral neuropathy. ?  ?She has hypercholesterolemia and hypertension.  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  She is a tobacco abuser and smokes more than 1 pack/day.  This is complicated by COPD.   ?  ?Her performance status is fairly good per patient reporting.  She reports some mild shortness of breath when scaling stairs but can ambulate within the house without shortness of breath.  She does not use oxygen at home and has not been recommended to do so.  She denies chest pain or lower extremity claudication symptoms. ?  ?Her last colonoscopy was with Dr. Henrene Pastor in 2018 which revealed multiple polyps which were removed with pathology benign.  The patient had an updated colonoscopy performed at the end of 2021 showing multiple diverticula within the sigmoid colon, otherwise normal exam. ? ?The patient was previously seeing Dr. 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  She was ultimately scheduled for surgery previously with phlebotomy prior to lower her hemoglobin prior to surgery.  She called before surgery but unfortunately because of a temperature at the time that she went to see her primary care provider, has not had any adjustment to her diabetes medications.  She also was found to still have a hemoglobin of 16.6 and commendation was made to  delay surgery.  Unfortunately, the patient did not come to multiple appointments for phlebotomy in order to get her hemoglobin down in anticipation of surgery.  She has been lost to follow-up within our system since April 2022. ? ?Interval History: ?Today, presents noting that she has had increasing abdominal girth, abdominal pain and discomfort.  Despite being hungry, she can often not eat more than 1 meal a day and often gets full  after only a couple of bites.  She reports regular bowel function.  Has increased pelvic pressure and urinary frequency.  Denies any vaginal bleeding or discharge.  Continues to have low back pain. ? ?Last POC Hgb A1c was 7.3% in March.  Notes that she was taken off of her diabetes medication by her primary care provider.  She is watching her diet.  Does not check her blood sugar at home. ? ?CBC in March showed Hgb 17, Hct 49.4.  ? ?He continues to smoke 2 packs of cigarettes per day. ? ?Past Medical/Surgical History: ?Past Medical History:  ?Diagnosis Date  ? Asthma   ? No PFT performed  ? Blood transfusion without reported diagnosis   ? has to donate blood due to having "thick" blood.  ? COPD (chronic obstructive pulmonary disease) ([place])   ? Depression   ? Diabetes mellitus 2002  ? GERD (gastroesophageal reflux disease)   ? Helicobacter pylori (H. pylori) infection   ? s/p triple therapy  ? Hepatic hemangioma   ? Hyperlipidemia   ? Hypertriglyceridemia   ? Panic attacks   ? mostly [employer]  ? Ventral hernia   ? ? ?Past Surgical History:  ?Procedure Laterality Date  ? CESAREAN SECTION    ? with 3 children  ? COLONOSCOPY  2018  ? OVARIAN CYST REMOVAL    ? Age 17  ? TUBAL LIGATION    ? UPPER GASTROINTESTINAL ENDOSCOPY  2018  ? ? ?Family History  ?Problem Relation Age of Onset  ? Colon polyps Mother   ?     benign  ? Ovarian cancer Mother   ? Breast cancer Maternal Aunt   ? Diabetes Maternal Uncle   ? Colon cancer Neg Hx   ? Stomach cancer Neg Hx   ? Endometrial cancer Neg Hx   ? Prostate cancer Neg Hx   ? Pancreatic cancer Neg Hx   ? Esophageal cancer Neg Hx   ? Rectal cancer Neg Hx   ? ? ?Social History  ? ?Socioeconomic History  ? Marital status: Married  ?  Spouse name: Not on file  ? Number of children: Not on file  ? Years of education: Not on file  ? Highest education level: Not on file  ?Occupational History  ? Not on file  ?Tobacco Use  ? Smoking status: Every Day  ?  Packs/day: 1.50  ?  Years: 43.00  ?   Pack years: 64.50  ?  Types: Cigarettes  ? Smokeless tobacco: Never  ?Vaping Use  ? Vaping Use: Never used  ?Substance and Sexual Activity  ? Alcohol use: No  ?  Alcohol/week: 0.0 standard drinks  ?  Comment: none  ? Drug use: No  ? Sexual activity: Not Currently  ?Other Topics Concern  ? Not on file  ?Social History Narrative  ? Drinks at least a pot of coffee every day.   ? ?Social Determinants of Health  ? ?Financial Resource Strain: Not on file  ?Food Insecurity: Not on file  ?Transportation Needs: Not on file  ?Physical  Activity: Not on file  ?Stress: Not on file  ?Social Connections: Not on file  ? ? ?Current Medications: ? ?Current Outpatient Medications:  ?  acetaminophen (TYLENOL) 500 MG tablet, Take 1,000 mg by mouth every 6 (six) hours as needed for mild pain., Disp: , Rfl:  ?  ADVAIR DISKUS 250-50 MCG/ACT AEPB, INHALE 1 INHALATION BY  MOUTH INTO THE LUNGS IN THE MORNING AND AT BEDTIME, Disp: 180 each, Rfl: 3 ?  albuterol (VENTOLIN HFA) 108 (90 Base) MCG/ACT inhaler, INHALE 1 TO 2 PUFFS BY MOUTH EVERY 6 HOURS AS NEEDED FOR WHEEZING FOR SHORTNESS OF BREATH, Disp: 9 g, Rfl: 11 ?  ALPRAZolam (XANAX) 0.5 MG tablet, TAKE 1 TABLET BY MOUTH AT BEDTIME AS NEEDED FOR ANXIETY (Patient taking differently: Take 0.5 mg by mouth at bedtime as needed for anxiety. TAKE 1 TABLET BY MOUTH AT BEDTIME AS NEEDED FOR ANXIETY), Disp: 30 tablet, Rfl: 0 ?  aspirin 81 MG tablet, Take 1 tablet (81 mg total) by mouth daily., Disp: 30 tablet, Rfl: 2 ?  atorvastatin (LIPITOR) 40 MG tablet, Take 40 mg by mouth daily., Disp: , Rfl:  ?  cetirizine (ZYRTEC) 10 MG tablet, Take 1 tablet (10 mg total) by mouth daily., Disp: 30 tablet, Rfl: 2 ?  cimetidine (TAGAMET) 200 MG tablet, Take 200 mg by mouth 2 (two) times daily., Disp: , Rfl:  ?  citalopram (CELEXA) 40 MG tablet, Take 1 tablet (40 mg total) by mouth daily., Disp: 90 tablet, Rfl: 0 ?  dicyclomine (BENTYL) 20 MG tablet, Take 1 tablet (20 mg total) by mouth 4 (four) times daily -   before meals and at bedtime., Disp: 90 tablet, Rfl: 0 ?  ezetimibe (ZETIA) 10 MG tablet, Take 1 tablet (10 mg total) by mouth daily., Disp: 90 tablet, Rfl: 2 ?  glucose blood test strip, Use as instructed,

## 2021-11-02 ENCOUNTER — Telehealth: Payer: Self-pay | Admitting: Gynecologic Oncology

## 2021-11-02 ENCOUNTER — Telehealth: Payer: Self-pay | Admitting: Oncology

## 2021-11-02 NOTE — Telephone Encounter (Signed)
Called Vanessa [name] and she would like to see Dr. Alvy Bimler and schedule phlebotomy on Friday, 11/04/21.  Advised we will call her back with appointments. ?

## 2021-11-02 NOTE — Telephone Encounter (Signed)
.  Called patient to schedule appointment per 5/10 inbasket, patient is aware of date and time.   ?

## 2021-11-03 ENCOUNTER — Telehealth: Payer: Self-pay | Admitting: Hematology and Oncology

## 2021-11-03 ENCOUNTER — Other Ambulatory Visit: Payer: Self-pay | Admitting: Hematology and Oncology

## 2021-11-03 DIAGNOSIS — D751 Secondary polycythemia: Secondary | ICD-10-CM

## 2021-11-03 NOTE — Telephone Encounter (Signed)
.  Called patient to schedule appointment per 5/11 inbasket, patient is aware of date and time.   ?

## 2021-11-03 NOTE — Telephone Encounter (Signed)
High priority LOS Sent ?

## 2021-11-04 ENCOUNTER — Inpatient Hospital Stay: Payer: 59

## 2021-11-04 ENCOUNTER — Other Ambulatory Visit: Payer: Self-pay

## 2021-11-04 ENCOUNTER — Encounter: Payer: Self-pay | Admitting: Hematology and Oncology

## 2021-11-04 ENCOUNTER — Inpatient Hospital Stay: Payer: 59 | Admitting: Hematology and Oncology

## 2021-11-04 VITALS — BP 112/73 | HR 90 | Temp 97.7°F | Resp 16

## 2021-11-04 DIAGNOSIS — D751 Secondary polycythemia: Secondary | ICD-10-CM | POA: Diagnosis not present

## 2021-11-04 DIAGNOSIS — J410 Simple chronic bronchitis: Secondary | ICD-10-CM

## 2021-11-04 DIAGNOSIS — F17200 Nicotine dependence, unspecified, uncomplicated: Secondary | ICD-10-CM

## 2021-11-04 DIAGNOSIS — R1900 Intra-abdominal and pelvic swelling, mass and lump, unspecified site: Secondary | ICD-10-CM | POA: Diagnosis not present

## 2021-11-04 LAB — CBC WITH DIFFERENTIAL/PLATELET
Abs Immature Granulocytes: 0.03 10*3/uL (ref 0.00–0.07)
Basophils Absolute: 0.1 10*3/uL (ref 0.0–0.1)
Basophils Relative: 1 %
Eosinophils Absolute: 0.4 10*3/uL (ref 0.0–0.5)
Eosinophils Relative: 4 %
HCT: 51.5 % — ABNORMAL HIGH (ref 36.0–46.0)
Hemoglobin: 17.4 g/dL — ABNORMAL HIGH (ref 12.0–15.0)
Immature Granulocytes: 0 %
Lymphocytes Relative: 27 %
Lymphs Abs: 3.2 10*3/uL (ref 0.7–4.0)
MCH: 30.3 pg (ref 26.0–34.0)
MCHC: 33.8 g/dL (ref 30.0–36.0)
MCV: 89.6 fL (ref 80.0–100.0)
Monocytes Absolute: 0.7 10*3/uL (ref 0.1–1.0)
Monocytes Relative: 6 %
Neutro Abs: 7.3 10*3/uL (ref 1.7–7.7)
Neutrophils Relative %: 62 %
Platelets: 402 10*3/uL — ABNORMAL HIGH (ref 150–400)
RBC: 5.75 MIL/uL — ABNORMAL HIGH (ref 3.87–5.11)
RDW: 12.6 % (ref 11.5–15.5)
WBC: 11.6 10*3/uL — ABNORMAL HIGH (ref 4.0–10.5)
nRBC: 0 % (ref 0.0–0.2)

## 2021-11-04 LAB — BASIC METABOLIC PANEL - CANCER CENTER ONLY
Anion gap: 5 (ref 5–15)
BUN: 9 mg/dL (ref 6–20)
CO2: 30 mmol/L (ref 22–32)
Calcium: 9.8 mg/dL (ref 8.9–10.3)
Chloride: 101 mmol/L (ref 98–111)
Creatinine: 0.56 mg/dL (ref 0.44–1.00)
GFR, Estimated: >60 mL/min (ref >60)
Glucose, Bld: 207 mg/dL — ABNORMAL HIGH (ref 70–99)
Potassium: 4.4 mmol/L (ref 3.5–5.1)
Sodium: 136 mmol/L (ref 135–145)

## 2021-11-04 LAB — CEA (IN HOUSE-CHCC): CEA (CHCC-In House): 14.4 ng/mL — ABNORMAL HIGH (ref 0.00–5.00)

## 2021-11-04 NOTE — Assessment & Plan Note (Signed)
Her abdomen is profoundly distended and she is known to have pelvic mass ?The patient is noncompliant and was lost to follow-up ?She is scheduled for CT imaging next week ?I will defer to GYN surgeon for further management ?

## 2021-11-04 NOTE — Assessment & Plan Note (Signed)
Unfortunately, she is still smoking heavily ?Does not appear to me that she is willing to quit ?

## 2021-11-04 NOTE — Progress Notes (Signed)
Vanessa [name] presents today for phlebotomy per MD orders. ?Phlebotomy procedure started at 1140 and ended at 1151.  ?An 18G IV needle was used to the [place].  ?512 grams removed. ?Patient provided snack and beverage.  ?Patient observed for 20 minutes after procedure without any incident--Pt declined to stay for full 30 minute post-observation period.  ?Patient tolerated procedure well. ?IV needle removed intact. ?Ambulatory to lobby. ? ? ?

## 2021-11-04 NOTE — Progress Notes (Signed)
? ?[place] ?OFFICE PROGRESS NOTE ? ?Vanessa Dame, MD ? ?ASSESSMENT & PLAN:  ?Polycythemia, secondary ?There is no doubt in my mind, her polycythemia is secondary to smoking and COPD ?Serum erythropoietin level has been drawn by her primary care doctor and it was within normal limits ?JAK2 mutation is negative ?I recommend 1 unit of blood to be removed today and again in 2-3 weeks to reach the goal of hemoglobin less than 15 to reduce risk of thrombosis ?I spent a lot of time discussing with the patient the importance of nicotine cessation ? ?Tobacco dependence ?Unfortunately, she is still smoking heavily ?Does not appear to me that she is willing to quit ? ?Pelvic mass ?Her abdomen is profoundly distended and she is known to have pelvic mass ?The patient is noncompliant and was lost to follow-up ?She is scheduled for CT imaging next week ?I will defer to GYN surgeon for further management ? ?No orders of the defined types were placed in this encounter. ? ? ?The total time spent in the appointment was 20 minutes encounter with patients including review of chart and various tests results, discussions about plan of care and coordination of care plan ? ? All questions were answered. The patient knows to call the clinic with any problems, questions or concerns. No barriers to learning was detected. ? ? ? Vanessa Lark, MD ?5/12/202312:04 PM ? ?INTERVAL HISTORY: ?Vanessa [name] 61 y.o. female returns for further evaluation ?She was lost to follow-up due to noncompliance ?She was evaluated by myself for secondary polycythemia due to tobacco use ?She has large pelvic mass suspicious for malignancy and is currently being evaluated by GYN surgeon ?Unfortunately, since her last visit, she continues to smoke ? ?SUMMARY OF HEMATOLOGIC HISTORY: ? ?Vanessa [name] is here because of elevated hemoglobin. ?Her husband, Vanessa [name] is available with her at the appointment today ? ?She was found to have abnormal CBC from  recent visit to her primary care doctor's office ?On review of her electronic records from 2007 until her most recent blood work from October 14, 2019, hemoglobin fluctuated from 14.7 to as high as 18.1 ? ?She denies intermittent headaches, frequent leg cramps and occasional chest pain.  ?She has shortness of breath and chronic fatigue secondary to COPD ?She has been diagnosed with COPD for at least 5 years but does not need oxygen ?She has been smoking since the age of 71, currently at 2 packs of cigarettes per day ?Her husband used to smoke but quit many years ago when he was diagnosed with a heart attack ?According to her husband, the patient is reluctant to quit because this is her coping strategy ? ?She never suffer from diagnosis of blood clot.  ?There is no prior diagnosis of obstructive sleep apnea.  ?She underwent extensive evaluation in April 2021 and was found to have secondary polycythemia.  JAK2 mutation/erythropoietin levels were normal ?She was started on phlebotomy. ?At the end of 2021, she was found to have a pelvic mass but unable to get surgery due to her uncontrolled diabetes and high hemoglobin ?She was subsequently loss to follow-up ? ?I have reviewed the past medical history, past surgical history, social history and family history with the patient and they are unchanged from previous note. ? ?ALLERGIES:  is allergic to jardiance [empagliflozin], latex, and avandia [rosiglitazone]. ? ?MEDICATIONS:  ?Current Outpatient Medications  ?Medication Sig Dispense Refill  ? acetaminophen (TYLENOL) 500 MG tablet Take 1,000 mg by mouth every 6 (six)  hours as needed for mild pain.    ? ADVAIR DISKUS 250-50 MCG/ACT AEPB INHALE 1 INHALATION BY  MOUTH INTO THE LUNGS IN THE MORNING AND AT BEDTIME 180 each 3  ? albuterol (VENTOLIN HFA) 108 (90 Base) MCG/ACT inhaler INHALE 1 TO 2 PUFFS BY MOUTH EVERY 6 HOURS AS NEEDED FOR WHEEZING FOR SHORTNESS OF BREATH 9 g 11  ? ALPRAZolam (XANAX) 0.5 MG tablet TAKE 1 TABLET BY  MOUTH AT BEDTIME AS NEEDED FOR ANXIETY (Patient taking differently: Take 0.5 mg by mouth at bedtime as needed for anxiety. TAKE 1 TABLET BY MOUTH AT BEDTIME AS NEEDED FOR ANXIETY) 30 tablet 0  ? aspirin 81 MG tablet Take 1 tablet (81 mg total) by mouth daily. 30 tablet 2  ? atorvastatin (LIPITOR) 40 MG tablet Take 40 mg by mouth daily.    ? cetirizine (ZYRTEC) 10 MG tablet Take 1 tablet (10 mg total) by mouth daily. 30 tablet 2  ? cimetidine (TAGAMET) 200 MG tablet Take 200 mg by mouth 2 (two) times daily.    ? citalopram (CELEXA) 40 MG tablet Take 1 tablet (40 mg total) by mouth daily. 90 tablet 0  ? dicyclomine (BENTYL) 20 MG tablet Take 1 tablet (20 mg total) by mouth 4 (four) times daily -  before meals and at bedtime. 90 tablet 0  ? ezetimibe (ZETIA) 10 MG tablet Take 1 tablet (10 mg total) by mouth daily. 90 tablet 2  ? fluticasone (FLONASE) 50 MCG/ACT nasal spray Place 2 sprays into both nostrils daily. (Patient not taking: Reported on 06/17/2020) 15.8 mL 2  ? glucose blood test strip Use as instructed 100 each 12  ? ibuprofen (ADVIL,MOTRIN) 200 MG tablet Take 600 mg by mouth every 6 (six) hours as needed for moderate pain.    ? lisinopril (ZESTRIL) 10 MG tablet Take 1 tablet (10 mg total) by mouth daily. 30 tablet 0  ? tiotropium (SPIRIVA HANDIHALER) 18 MCG inhalation capsule PLACE ONE CAPSULE INTO INHALER AND INHALE DAILY. Strength: 18 mcg 30 capsule 0  ? ?No current facility-administered medications for this visit.  ?  ? ?REVIEW OF SYSTEMS:   ?Constitutional: Denies fevers, chills or night sweats ?Eyes: Denies blurriness of vision ?Ears, nose, mouth, throat, and face: Denies mucositis or sore throat ?Respiratory: Denies cough, dyspnea or wheezes ?Cardiovascular: Denies palpitation, chest discomfort or lower extremity swelling ?Gastrointestinal:  Denies nausea, heartburn or change in bowel habits ?Skin: Denies abnormal skin rashes ?Lymphatics: Denies new lymphadenopathy or easy bruising ?Neurological:Denies  numbness, tingling or new weaknesses ?Behavioral/Psych: Mood is stable, no new changes  ?All other systems were reviewed with the patient and are negative. ? ?PHYSICAL EXAMINATION: ?ECOG PERFORMANCE STATUS: 1 - Symptomatic but completely ambulatory ? ?Vitals:  ? 11/04/21 1051  ?BP: (!) 142/55  ?Pulse: 85  ?Resp: 18  ?Temp: 97.6 ?F (36.4 ?C)  ?SpO2: 92%  ? ?Filed Weights  ? 11/04/21 1051  ?Weight: 154 lb 12.2 oz (70.2 kg)  ? ? ?GENERAL:alert, no distress and comfortable ?NEURO: alert & oriented x 3 with fluent speech, no focal motor/sensory deficits ? ?LABORATORY DATA:  ?I have reviewed the data as listed ? ?   ?Component Value Date/Time  ? NA 136 11/04/2021 1019  ? NA 142 09/21/2021 1543  ? K 4.4 11/04/2021 1019  ? CL 101 11/04/2021 1019  ? CO2 30 11/04/2021 1019  ? GLUCOSE 207 (H) 11/04/2021 1019  ? BUN 9 11/04/2021 1019  ? BUN 8 09/21/2021 1543  ? CREATININE 0.56 11/04/2021 1019  ?  CREATININE 0.45 (L) 12/03/2014 1542  ? CALCIUM 9.8 11/04/2021 1019  ? PROT 7.1 05/12/2020 1220  ? ALBUMIN 3.6 05/12/2020 1220  ? AST 9 (L) 05/12/2020 1220  ? ALT 7 05/12/2020 1220  ? ALKPHOS 120 05/12/2020 1220  ? BILITOT 0.4 05/12/2020 1220  ? GFRNONAA >60 11/04/2021 1019  ? GFRNONAA >89 12/03/2014 1542  ? GFRAA 125 03/11/2020 1644  ? GFRAA >89 12/03/2014 1542  ? ? ?No results found for: SPEP, UPEP ? ?Lab Results  ?Component Value Date  ? WBC 11.6 (H) 11/04/2021  ? NEUTROABS 7.3 11/04/2021  ? HGB 17.4 (H) 11/04/2021  ? HCT 51.5 (H) 11/04/2021  ? MCV 89.6 11/04/2021  ? PLT 402 (H) 11/04/2021  ? ? ?  Chemistry   ?   ?Component Value Date/Time  ? NA 136 11/04/2021 1019  ? NA 142 09/21/2021 1543  ? K 4.4 11/04/2021 1019  ? CL 101 11/04/2021 1019  ? CO2 30 11/04/2021 1019  ? BUN 9 11/04/2021 1019  ? BUN 8 09/21/2021 1543  ? CREATININE 0.56 11/04/2021 1019  ? CREATININE 0.45 (L) 12/03/2014 1542  ?    ?Component Value Date/Time  ? CALCIUM 9.8 11/04/2021 1019  ? ALKPHOS 120 05/12/2020 1220  ? AST 9 (L) 05/12/2020 1220  ? ALT 7 05/12/2020 1220   ? BILITOT 0.4 05/12/2020 1220  ?  ? ?

## 2021-11-04 NOTE — Assessment & Plan Note (Signed)
There is no doubt in my mind, her polycythemia is secondary to smoking and COPD ?Serum erythropoietin level has been drawn by her primary care doctor and it was within normal limits ?JAK2 mutation is negative ?I recommend 1 unit of blood to be removed today and again in 2-3 weeks to reach the goal of hemoglobin less than 15 to reduce risk of thrombosis ?I spent a lot of time discussing with the patient the importance of nicotine cessation ?

## 2021-11-04 NOTE — Patient Instructions (Signed)

## 2021-11-05 LAB — CA 125: Cancer Antigen (CA) 125: 26.2 U/mL (ref 0.0–38.1)

## 2021-11-08 ENCOUNTER — Encounter: Payer: Self-pay | Admitting: Hematology and Oncology

## 2021-11-08 ENCOUNTER — Other Ambulatory Visit: Payer: 59

## 2021-11-09 ENCOUNTER — Ambulatory Visit (HOSPITAL_COMMUNITY)
Admission: RE | Admit: 2021-11-09 | Discharge: 2021-11-09 | Disposition: A | Discharge status: Home / Self Care | Payer: 59 | Source: Ambulatory Visit | Attending: Gynecologic Oncology | Admitting: Gynecologic Oncology

## 2021-11-09 DIAGNOSIS — R1900 Intra-abdominal and pelvic swelling, mass and lump, unspecified site: Secondary | ICD-10-CM | POA: Insufficient documentation

## 2021-11-09 MED ORDER — SODIUM CHLORIDE (PF) 0.9 % IJ SOLN
INTRAMUSCULAR | Status: AC
  Filled 2021-11-09: qty 50

## 2021-11-09 MED ORDER — IOHEXOL 300 MG/ML  SOLN
100.0000 mL | Freq: Once | INTRAMUSCULAR | Status: AC | PRN
  Administered 2021-11-09: 100 mL via INTRAVENOUS

## 2021-11-11 ENCOUNTER — Other Ambulatory Visit: Payer: Self-pay | Admitting: Student

## 2021-11-11 DIAGNOSIS — J449 Chronic obstructive pulmonary disease, unspecified: Secondary | ICD-10-CM

## 2021-11-17 ENCOUNTER — Telehealth: Payer: Self-pay | Admitting: Oncology

## 2021-11-17 ENCOUNTER — Telehealth: Payer: Self-pay | Admitting: Internal Medicine

## 2021-11-17 NOTE — Telephone Encounter (Signed)
Left a message for Dr. Blanch Media nurse regarding elevated CEA level and if any procedures would be recommended or to proceed with surgery.

## 2021-11-17 NOTE — Telephone Encounter (Signed)
Inbound call from [name]  at Dr. Lulu Riding office wanting to speak with nurse about patient. Requesting a call back, please advise.

## 2021-11-18 ENCOUNTER — Other Ambulatory Visit: Payer: Self-pay

## 2021-11-18 ENCOUNTER — Telehealth: Payer: Self-pay | Admitting: Gynecologic Oncology

## 2021-11-18 ENCOUNTER — Encounter: Payer: Self-pay | Admitting: Hematology and Oncology

## 2021-11-18 ENCOUNTER — Other Ambulatory Visit: Payer: Self-pay | Admitting: Gynecologic Oncology

## 2021-11-18 ENCOUNTER — Inpatient Hospital Stay: Payer: 59

## 2021-11-18 ENCOUNTER — Inpatient Hospital Stay: Payer: 59 | Admitting: Hematology and Oncology

## 2021-11-18 ENCOUNTER — Telehealth: Payer: Self-pay | Admitting: *Deleted

## 2021-11-18 VITALS — BP 129/63 | HR 93 | Temp 98.4°F | Resp 20

## 2021-11-18 DIAGNOSIS — D751 Secondary polycythemia: Secondary | ICD-10-CM | POA: Diagnosis not present

## 2021-11-18 DIAGNOSIS — J410 Simple chronic bronchitis: Secondary | ICD-10-CM

## 2021-11-18 DIAGNOSIS — F17200 Nicotine dependence, unspecified, uncomplicated: Secondary | ICD-10-CM | POA: Diagnosis not present

## 2021-11-18 DIAGNOSIS — R1900 Intra-abdominal and pelvic swelling, mass and lump, unspecified site: Secondary | ICD-10-CM

## 2021-11-18 LAB — CBC WITH DIFFERENTIAL/PLATELET
Abs Immature Granulocytes: 0.03 10*3/uL (ref 0.00–0.07)
Basophils Absolute: 0.1 10*3/uL (ref 0.0–0.1)
Basophils Relative: 1 %
Eosinophils Absolute: 0.4 10*3/uL (ref 0.0–0.5)
Eosinophils Relative: 3 %
HCT: 49.6 % — ABNORMAL HIGH (ref 36.0–46.0)
Hemoglobin: 16.8 g/dL — ABNORMAL HIGH (ref 12.0–15.0)
Immature Granulocytes: 0 %
Lymphocytes Relative: 28 %
Lymphs Abs: 3.2 10*3/uL (ref 0.7–4.0)
MCH: 30.3 pg (ref 26.0–34.0)
MCHC: 33.9 g/dL (ref 30.0–36.0)
MCV: 89.5 fL (ref 80.0–100.0)
Monocytes Absolute: 0.7 10*3/uL (ref 0.1–1.0)
Monocytes Relative: 6 %
Neutro Abs: 7 10*3/uL (ref 1.7–7.7)
Neutrophils Relative %: 62 %
Platelets: 424 10*3/uL — ABNORMAL HIGH (ref 150–400)
RBC: 5.54 MIL/uL — ABNORMAL HIGH (ref 3.87–5.11)
RDW: 12.9 % (ref 11.5–15.5)
WBC: 11.4 10*3/uL — ABNORMAL HIGH (ref 4.0–10.5)
nRBC: 0 % (ref 0.0–0.2)

## 2021-11-18 NOTE — Assessment & Plan Note (Signed)
Unfortunately, she is still smoking  Does not appear to me that she is willing to quit

## 2021-11-18 NOTE — Telephone Encounter (Signed)
Spoke with Remo Lipps at Dr.Perry's office who was returning [employer]. Informed him that the pt's CEA is 14.40 and if any procedures would be recommended or to proceed with surgery. Remo Lipps stated he will set up a follow up appt with the pt to discuss the lab results and any procedure or surgical recommendations.

## 2021-11-18 NOTE — Progress Notes (Signed)
Vanessa [name] presents today for phlebotomy per MD orders. Phlebotomy procedure started at 1301 and ended at 1307. 515 grams removed. 16 gauge phlebotomy kit used. Patient observed for 30 minutes after procedure without any incident. Patient tolerated procedure well. IV needle removed intact.  Food and fluids given.

## 2021-11-18 NOTE — Telephone Encounter (Signed)
Spoke with pt today to inform her that per Joylene John, NP her last dose of aspirin needs to be the day before the surgery. Pt verbalized understanding.

## 2021-11-18 NOTE — Progress Notes (Signed)
[facility] OFFICE PROGRESS NOTE  Sanjuan Dame, MD  ASSESSMENT & PLAN:  Polycythemia, secondary There is no doubt in my mind, her polycythemia is secondary to smoking and COPD Serum erythropoietin level has been drawn by her primary care doctor and it was within normal limits JAK2 mutation is negative I recommend 1 unit of blood to be removed today and again in approximately 4 weeks to reach the goal of hemoglobin less than 16 to reduce risk of thrombosis I spent a lot of time discussing with the patient the importance of nicotine cessation  Tobacco dependence Unfortunately, she is still smoking  Does not appear to me that she is willing to quit  Pelvic mass Recent CEA was elevated but this could be related to her ongoing smoking CA1 25 is normal I have reviewed the plan with GYN surgeon who plan for future procedure  No orders of the defined types were placed in this encounter.   The total time spent in the appointment was 20 minutes encounter with patients including review of chart and various tests results, discussions about plan of care and coordination of care plan   All questions were answered. The patient knows to call the clinic with any problems, questions or concerns. No barriers to learning was detected.    Heath Lark, MD 5/26/20231:06 PM  INTERVAL HISTORY: KESLEIGH MORSON 61 y.o. female returns for follow-up for secondary polycythemia The patient expressed concern that she has not have surgery done yet to alleviate her abdominal distention Unfortunately, she is still smoking She smoked because she is stressed with her social situation.  Her mother fell and broke her hip recently.  Her husband is unwell She tolerated recent phlebotomy well  SUMMARY OF HEMATOLOGIC HISTORY:  TAYANA SHANKLE is here because of elevated hemoglobin. Her husband, [name] is available with her at the appointment today  She was found to have abnormal CBC from recent visit  to her primary care doctor's office On review of her electronic records from 2007 until her most recent blood work from October 14, 2019, hemoglobin fluctuated from 14.7 to as high as 18.1  She denies intermittent headaches, frequent leg cramps and occasional chest pain.  She has shortness of breath and chronic fatigue secondary to COPD She has been diagnosed with COPD for at least 5 years but does not need oxygen She has been smoking since the age of 49, currently at 2 packs of cigarettes per day Her husband used to smoke but quit many years ago when he was diagnosed with a heart attack According to her husband, the patient is reluctant to quit because this is her coping strategy  She never suffer from diagnosis of blood clot.  There is no prior diagnosis of obstructive sleep apnea.  She underwent extensive evaluation in April 2021 and was found to have secondary polycythemia.  JAK2 mutation/erythropoietin levels were normal She was started on phlebotomy. At the end of 2021, she was found to have a pelvic mass but unable to get surgery due to her uncontrolled diabetes and high hemoglobin She was subsequently loss to follow-up The patient returns in May for repeat phlebotomy in anticipation for GYN surgery  I have reviewed the past medical history, past surgical history, social history and family history with the patient and they are unchanged from previous note.  ALLERGIES:  is allergic to jardiance [empagliflozin], latex, and avandia [rosiglitazone].  MEDICATIONS:  Current Outpatient Medications  Medication Sig Dispense Refill  acetaminophen (TYLENOL) 500 MG tablet Take 1,000 mg by mouth every 6 (six) hours as needed for mild pain.     ADVAIR DISKUS 250-50 MCG/ACT AEPB INHALE 1 INHALATION BY  MOUTH INTO THE LUNGS IN THE MORNING AND AT BEDTIME 180 each 3   albuterol (VENTOLIN HFA) 108 (90 Base) MCG/ACT inhaler INHALE 1 TO 2 PUFFS BY MOUTH EVERY 6 HOURS AS NEEDED FOR WHEEZING FOR SHORTNESS  OF BREATH 9 g 11   ALPRAZolam (XANAX) 0.5 MG tablet TAKE 1 TABLET BY MOUTH AT BEDTIME AS NEEDED FOR ANXIETY (Patient taking differently: Take 0.5 mg by mouth at bedtime as needed for anxiety. TAKE 1 TABLET BY MOUTH AT BEDTIME AS NEEDED FOR ANXIETY) 30 tablet 0   aspirin 81 MG tablet Take 1 tablet (81 mg total) by mouth daily. 30 tablet 2   atorvastatin (LIPITOR) 40 MG tablet Take 40 mg by mouth daily.     cetirizine (ZYRTEC) 10 MG tablet Take 1 tablet (10 mg total) by mouth daily. 30 tablet 2   cimetidine (TAGAMET) 200 MG tablet Take 200 mg by mouth 2 (two) times daily.     citalopram (CELEXA) 40 MG tablet Take 1 tablet (40 mg total) by mouth daily. 90 tablet 0   dicyclomine (BENTYL) 20 MG tablet Take 1 tablet (20 mg total) by mouth 4 (four) times daily -  before meals and at bedtime. 90 tablet 0   ezetimibe (ZETIA) 10 MG tablet Take 1 tablet (10 mg total) by mouth daily. 90 tablet 2   fluticasone (FLONASE) 50 MCG/ACT nasal spray Place 2 sprays into both nostrils daily. (Patient not taking: Reported on 06/17/2020) 15.8 mL 2   glucose blood test strip Use as instructed 100 each 12   ibuprofen (ADVIL,MOTRIN) 200 MG tablet Take 600 mg by mouth every 6 (six) hours as needed for moderate pain.     lisinopril (ZESTRIL) 10 MG tablet Take 1 tablet (10 mg total) by mouth daily. 30 tablet 0   tiotropium (SPIRIVA HANDIHALER) 18 MCG inhalation capsule PLACE ONE CAPSULE INTO INHALER AND INHALE DAILY 30 capsule 0   No current facility-administered medications for this visit.     REVIEW OF SYSTEMS:   Constitutional: Denies fevers, chills or night sweats Eyes: Denies blurriness of vision Ears, nose, mouth, throat, and face: Denies mucositis or sore throat Respiratory: Denies cough, dyspnea or wheezes Cardiovascular: Denies palpitation, chest discomfort or lower extremity swelling Gastrointestinal:  Denies nausea, heartburn or change in bowel habits Skin: Denies abnormal skin rashes Lymphatics: Denies new  lymphadenopathy or easy bruising Neurological:Denies numbness, tingling or new weaknesses Behavioral/Psych: Mood is stable, no new changes  All other systems were reviewed with the patient and are negative.  PHYSICAL EXAMINATION: ECOG PERFORMANCE STATUS: 0 - Asymptomatic  Vitals:   11/18/21 1215  BP: (!) 121/58  Pulse: 91  Resp: 18  Temp: 98.3 F (36.8 C)  SpO2: 94%   Filed Weights   11/18/21 1215  Weight: 152 lb 6.4 oz (69.1 kg)    GENERAL:alert, no distress and comfortable ABDOMEN:abdomen is grossly distended Musculoskeletal:no cyanosis of digits and no clubbing  NEURO: alert & oriented x 3 with fluent speech, no focal motor/sensory deficits  LABORATORY DATA:  I have reviewed the data as listed     Component Value Date/Time   NA 136 11/04/2021 1019   NA 142 09/21/2021 1543   K 4.4 11/04/2021 1019   CL 101 11/04/2021 1019   CO2 30 11/04/2021 1019   GLUCOSE 207 (H) 11/04/2021  1019   BUN 9 11/04/2021 1019   BUN 8 09/21/2021 1543   CREATININE 0.56 11/04/2021 1019   CREATININE 0.45 (L) 12/03/2014 1542   CALCIUM 9.8 11/04/2021 1019   PROT 7.1 05/12/2020 1220   ALBUMIN 3.6 05/12/2020 1220   AST 9 (L) 05/12/2020 1220   ALT 7 05/12/2020 1220   ALKPHOS 120 05/12/2020 1220   BILITOT 0.4 05/12/2020 1220   GFRNONAA >60 11/04/2021 1019   GFRNONAA >89 12/03/2014 1542   GFRAA 125 03/11/2020 1644   GFRAA >89 12/03/2014 1542    No results found for: SPEP, UPEP  Lab Results  Component Value Date   WBC 11.4 (H) 11/18/2021   NEUTROABS 7.0 11/18/2021   HGB 16.8 (H) 11/18/2021   HCT 49.6 (H) 11/18/2021   MCV 89.5 11/18/2021   PLT 424 (H) 11/18/2021      Chemistry      Component Value Date/Time   NA 136 11/04/2021 1019   NA 142 09/21/2021 1543   K 4.4 11/04/2021 1019   CL 101 11/04/2021 1019   CO2 30 11/04/2021 1019   BUN 9 11/04/2021 1019   BUN 8 09/21/2021 1543   CREATININE 0.56 11/04/2021 1019   CREATININE 0.45 (L) 12/03/2014 1542      Component Value  Date/Time   CALCIUM 9.8 11/04/2021 1019   ALKPHOS 120 05/12/2020 1220   AST 9 (L) 05/12/2020 1220   ALT 7 05/12/2020 1220   BILITOT 0.4 05/12/2020 1220

## 2021-11-18 NOTE — Telephone Encounter (Signed)
Called patient to follow-up.  Left message asking her to please contact the office at her earliest convenience.  Also informed her I had several dates for potential surgery that I would like to discuss with her as well.

## 2021-11-18 NOTE — Progress Notes (Signed)
Please place orders for PAT appointment scheduled 11/22/21.

## 2021-11-18 NOTE — Assessment & Plan Note (Signed)
Recent CEA was elevated but this could be related to her ongoing smoking CA1 25 is normal I have reviewed the plan with GYN surgeon who plan for future procedure

## 2021-11-18 NOTE — Telephone Encounter (Signed)
Pt made aware of Dr. Berline Lopes office reaching out to Dr Henrene Pastor office  in regard to elevated Labs and requesting info on Follow Up. Pt was scheduled for an office visit with Ellouise Newer PA on 12/07/2021 at 9:00 AM : Pt made aware. Pt verbalized understanding with all questions answered.

## 2021-11-18 NOTE — Telephone Encounter (Signed)
Spoke to [place] at  Dr. Berline Lopes office: [name] stated that Vanessa [name] had called and left a message for Dr. Blanch Media nurse yesterday  regarding elevated CEA level and if any procedures would be recommended or to proceed with surgery. Pt was called to set up an office visit: Left message for pt to call back

## 2021-11-18 NOTE — Assessment & Plan Note (Signed)
There is no doubt in my mind, her polycythemia is secondary to smoking and COPD Serum erythropoietin level has been drawn by her primary care doctor and it was within normal limits JAK2 mutation is negative I recommend 1 unit of blood to be removed today and again in approximately 4 weeks to reach the goal of hemoglobin less than 16 to reduce risk of thrombosis I spent a lot of time discussing with the patient the importance of nicotine cessation

## 2021-11-18 NOTE — Patient Instructions (Signed)

## 2021-11-18 NOTE — Progress Notes (Signed)
COVID Vaccine Completed:  Date of COVID positive in last 90 days:  PCP - Hadassah Pais, MD Cardiologist -   Chest x-ray -  EKG -  Stress Test -  ECHO -  Cardiac Cath -  Pacemaker/ICD device last checked: Spinal Cord Stimulator:  Bowel Prep -   Sleep Study -  CPAP -   Fasting Blood Sugar -  Checks Blood Sugar _____ times a day  Blood Thinner Instructions: Aspirin Instructions: ASA 81 Last Dose:  Activity level:  Can go up a flight of stairs and perform activities of daily living without stopping and without symptoms of chest pain or shortness of breath.   Able to exercise without symptoms  Unable to go up a flight of stairs without symptoms of      Anesthesia review:   Patient denies shortness of breath, fever, cough and chest pain at PAT appointment   Patient verbalized understanding of instructions that were given to them at the PAT appointment. Patient was also instructed that they will need to review over the PAT instructions again at home before surgery.

## 2021-11-18 NOTE — Patient Instructions (Signed)
DUE TO COVID-19 ONLY TWO VISITORS  (aged 61 and older)  ARE ALLOWED TO COME WITH YOU AND STAY IN THE WAITING ROOM ONLY DURING PRE OP AND PROCEDURE.   **NO VISITORS ARE ALLOWED IN THE SHORT STAY AREA OR RECOVERY ROOM!!**   Your procedure is scheduled on: 11/23/21   Report to [facility] Main Entrance    Report to admitting at 12:15 PM   Call this number if you have problems the morning of surgery [phone]   Do not eat food :After Midnight.   After Midnight you may have the following liquids until 11:30 AM DAY OF SURGERY  Water Black Coffee (sugar ok, NO MILK/CREAM OR CREAMERS)  Tea (sugar ok, NO MILK/CREAM OR CREAMERS) regular and decaf                             Plain Jell-O (NO RED)                                           Fruit ices (not with fruit pulp, NO RED)                                     Popsicles (NO RED)                                                                  Juice: apple, WHITE grape, WHITE cranberry Sports drinks like Gatorade (NO RED) Clear broth(vegetable,chicken,beef)  FOLLOW BOWEL PREP AND ANY ADDITIONAL PRE OP INSTRUCTIONS YOU RECEIVED FROM YOUR SURGEON'S OFFICE!!!     Oral Hygiene is also important to reduce your risk of infection.                                    Remember - BRUSH YOUR TEETH THE MORNING OF SURGERY WITH YOUR REGULAR TOOTHPASTE   Do NOT smoke after Midnight   Take these medicines the morning of surgery with A SIP OF WATER: Tylenol, Inhalers, Xanax, Atorvastatin, Zyrtec, Cimetidine, Citalopram, Zetia  DO NOT TAKE ANY ORAL DIABETIC MEDICATIONS DAY OF YOUR SURGERY  How to Manage Your Diabetes Before and After Surgery  Why is it important to control my blood sugar before and after surgery? Improving blood sugar levels before and after surgery helps healing and can limit problems. A way of improving blood sugar control is eating a healthy diet by:  Eating less sugar and carbohydrates  Increasing activity/exercise   Talking with your doctor about reaching your blood sugar goals High blood sugars (greater than 180 mg/dL) can raise your risk of infections and slow your recovery, so you will need to focus on controlling your diabetes during the weeks before surgery. Make sure that the doctor who takes care of your diabetes knows about your planned surgery including the date and location.  How do I manage my blood sugar before surgery? Check your blood sugar at least 4 times a day, starting 2 days before surgery, to  make sure that the level is not too high or low. Check your blood sugar the morning of your surgery when you wake up and every 2 hours until you get to the Short Stay unit. If your blood sugar is less than 70 mg/dL, you will need to treat for low blood sugar: Do not take insulin. Treat a low blood sugar (less than 70 mg/dL) with  cup of clear juice (cranberry or apple), 4 glucose tablets, OR glucose gel. Recheck blood sugar in 15 minutes after treatment (to make sure it is greater than 70 mg/dL). If your blood sugar is not greater than 70 mg/dL on recheck, call [phone] for further instructions. Report your blood sugar to the short stay nurse when you get to Short Stay.  If you are admitted to the hospital after surgery: Your blood sugar will be checked by the staff and you will probably be given insulin after surgery (instead of oral diabetes medicines) to make sure you have good blood sugar levels. The goal for blood sugar control after surgery is 80-180 mg/dL.    Reviewed and Endorsed by [facility] Patient Education Committee, August 2015                               You may not have any metal on your body including hair pins, jewelry, and body piercing             Do not wear make-up, lotions, powders, perfumes, or deodorant  Do not wear nail polish including gel and S&S, artificial/acrylic nails, or any other type of covering on natural nails including finger and toenails. If you have  artificial nails, gel coating, etc. that needs to be removed by a nail salon please have this removed prior to surgery or surgery may need to be canceled/ delayed if the surgeon/ anesthesia feels like they are unable to be safely monitored.   Do not shave  48 hours prior to surgery.    Do not bring valuables to the hospital. [place].   Contacts, dentures or bridgework may not be worn into surgery.    Patients discharged on the day of surgery will not be allowed to drive home.  Someone NEEDS to stay with you for the first 24 hours after anesthesia.              Please read over the following fact sheets you were given: IF YOU HAVE QUESTIONS ABOUT YOUR PRE-OP INSTRUCTIONS PLEASE CALL [facility] - Preparing for Surgery Before surgery, you can play an important role.  Because skin is not sterile, your skin needs to be as free of germs as possible.  You can reduce the number of germs on your skin by washing with CHG (chlorahexidine gluconate) soap before surgery.  CHG is an antiseptic cleaner which kills germs and bonds with the skin to continue killing germs even after washing. Please DO NOT use if you have an allergy to CHG or antibacterial soaps.  If your skin becomes reddened/irritated stop using the CHG and inform your nurse when you arrive at Short Stay. Do not shave (including legs and underarms) for at least 48 hours prior to the first CHG shower.  You may shave your face/neck.  Please follow these instructions carefully:  1.  Shower  with CHG Soap the night before surgery and the  morning of surgery.  2.  If you choose to wash your hair, wash your hair first as usual with your normal  shampoo.  3.  After you shampoo, rinse your hair and body thoroughly to remove the shampoo.                             4.  Use CHG as you would any other liquid soap.  You can apply chg directly to the skin and wash.  Gently with a  scrungie or clean washcloth.  5.  Apply the CHG Soap to your body ONLY FROM THE NECK DOWN.   Do   not use on face/ open                           Wound or open sores. Avoid contact with eyes, ears mouth and   genitals (private parts).                       Wash face,  Genitals (private parts) with your normal soap.             6.  Wash thoroughly, paying special attention to the area where your    surgery  will be performed.  7.  Thoroughly rinse your body with warm water from the neck down.  8.  DO NOT shower/wash with your normal soap after using and rinsing off the CHG Soap.                9.  Pat yourself dry with a clean towel.            10.  Wear clean pajamas.            11.  Place clean sheets on your bed the night of your first shower and do not  sleep with pets. Day of Surgery : Do not apply any lotions/deodorants the morning of surgery.  Please wear clean clothes to the hospital/surgery center.  FAILURE TO FOLLOW THESE INSTRUCTIONS MAY RESULT IN THE CANCELLATION OF YOUR SURGERY  PATIENT SIGNATURE_________________________________  NURSE SIGNATURE__________________________________  ________________________________________________________________________  WHAT IS A BLOOD TRANSFUSION? Blood Transfusion Information  A transfusion is the replacement of blood or some of its parts. Blood is made up of multiple cells which provide different functions. Red blood cells carry oxygen and are used for blood loss replacement. White blood cells fight against infection. Platelets control bleeding. Plasma helps clot blood. Other blood products are available for specialized needs, such as hemophilia or other clotting disorders. BEFORE THE TRANSFUSION  Who gives blood for transfusions?  Healthy volunteers who are fully evaluated to make sure their blood is safe. This is blood bank blood. Transfusion therapy is the safest it has ever been in the practice of medicine. Before blood is taken from  a donor, a complete history is taken to make sure that person has no history of diseases nor engages in risky social behavior (examples are intravenous drug use or sexual activity with multiple partners). The donor's travel history is screened to minimize risk of transmitting infections, such as malaria. The donated blood is tested for signs of infectious diseases, such as HIV and hepatitis. The blood is then tested to be sure it is compatible with you in order to minimize the chance of a transfusion reaction. If you or a  relative donates blood, this is often done in anticipation of surgery and is not appropriate for emergency situations. It takes many days to process the donated blood. RISKS AND COMPLICATIONS Although transfusion therapy is very safe and saves many lives, the main dangers of transfusion include:  Getting an infectious disease. Developing a transfusion reaction. This is an allergic reaction to something in the blood you were given. Every precaution is taken to prevent this. The decision to have a blood transfusion has been considered carefully by your caregiver before blood is given. Blood is not given unless the benefits outweigh the risks. AFTER THE TRANSFUSION Right after receiving a blood transfusion, you will usually feel much better and more energetic. This is especially true if your red blood cells have gotten low (anemic). The transfusion raises the level of the red blood cells which carry oxygen, and this usually causes an energy increase. The nurse administering the transfusion will monitor you carefully for complications. HOME CARE INSTRUCTIONS  No special instructions are needed after a transfusion. You may find your energy is better. Speak with your caregiver about any limitations on activity for underlying diseases you may have. SEEK MEDICAL CARE IF:  Your condition is not improving after your transfusion. You develop redness or irritation at the intravenous (IV)  site. SEEK IMMEDIATE MEDICAL CARE IF:  Any of the following symptoms occur over the next 12 hours: Shaking chills. You have a temperature by mouth above 102 F (38.9 C), not controlled by medicine. Chest, back, or muscle pain. People around you feel you are not acting correctly or are confused. Shortness of breath or difficulty breathing. Dizziness and fainting. You get a rash or develop hives. You have a decrease in urine output. Your urine turns a dark color or changes to pink, red, or brown. Any of the following symptoms occur over the next 10 days: You have a temperature by mouth above 102 F (38.9 C), not controlled by medicine. Shortness of breath. Weakness after normal activity. The white part of the eye turns yellow (jaundice). You have a decrease in the amount of urine or are urinating less often. Your urine turns a dark color or changes to pink, red, or brown. Document Released: 06/09/2000 Document Revised: 09/04/2011 Document Reviewed: 01/27/2008 [facility] Patient Information 2014 [place], [place].  _______________________________________________________________________

## 2021-11-22 ENCOUNTER — Encounter (HOSPITAL_COMMUNITY): Payer: Self-pay

## 2021-11-22 ENCOUNTER — Other Ambulatory Visit: Payer: Self-pay

## 2021-11-22 ENCOUNTER — Inpatient Hospital Stay (HOSPITAL_BASED_OUTPATIENT_CLINIC_OR_DEPARTMENT_OTHER): Payer: 59 | Admitting: Gynecologic Oncology

## 2021-11-22 ENCOUNTER — Encounter (HOSPITAL_COMMUNITY)
Admission: RE | Admit: 2021-11-22 | Discharge: 2021-11-22 | Disposition: A | Discharge status: Home / Self Care | Payer: 59 | Source: Ambulatory Visit | Attending: Gynecologic Oncology | Admitting: Gynecologic Oncology

## 2021-11-22 ENCOUNTER — Encounter: Payer: Self-pay | Admitting: Hematology and Oncology

## 2021-11-22 VITALS — BP 127/41 | HR 76 | Resp 16 | Ht 62.0 in | Wt 151.3 lb

## 2021-11-22 VITALS — BP 133/61 | HR 96 | Temp 98.6°F | Resp 16 | Ht 62.0 in | Wt 148.4 lb

## 2021-11-22 DIAGNOSIS — Z01818 Encounter for other preprocedural examination: Secondary | ICD-10-CM | POA: Diagnosis present

## 2021-11-22 DIAGNOSIS — E119 Type 2 diabetes mellitus without complications: Secondary | ICD-10-CM | POA: Diagnosis not present

## 2021-11-22 DIAGNOSIS — R1900 Intra-abdominal and pelvic swelling, mass and lump, unspecified site: Secondary | ICD-10-CM

## 2021-11-22 DIAGNOSIS — R970 Elevated carcinoembryonic antigen [CEA]: Secondary | ICD-10-CM | POA: Diagnosis not present

## 2021-11-22 DIAGNOSIS — K760 Fatty (change of) liver, not elsewhere classified: Secondary | ICD-10-CM

## 2021-11-22 DIAGNOSIS — I10 Essential (primary) hypertension: Secondary | ICD-10-CM

## 2021-11-22 DIAGNOSIS — Z794 Long term (current) use of insulin: Secondary | ICD-10-CM | POA: Diagnosis not present

## 2021-11-22 DIAGNOSIS — G8918 Other acute postprocedural pain: Secondary | ICD-10-CM

## 2021-11-22 HISTORY — DX: Personal history of urinary calculi: Z87.442

## 2021-11-22 HISTORY — DX: Essential (primary) hypertension: I10

## 2021-11-22 HISTORY — DX: Pneumonia, unspecified organism: J18.9

## 2021-11-22 LAB — COMPREHENSIVE METABOLIC PANEL
ALT: 15 U/L (ref 0–44)
AST: 17 U/L (ref 15–41)
Albumin: 4.1 g/dL (ref 3.5–5.0)
Alkaline Phosphatase: 89 U/L (ref 38–126)
Anion gap: 9 (ref 5–15)
BUN: 10 mg/dL (ref 6–20)
CO2: 26 mmol/L (ref 22–32)
Calcium: 9.3 mg/dL (ref 8.9–10.3)
Chloride: 102 mmol/L (ref 98–111)
Creatinine, Ser: 0.62 mg/dL (ref 0.44–1.00)
GFR, Estimated: >60 mL/min (ref >60)
Glucose, Bld: 152 mg/dL — ABNORMAL HIGH (ref 70–99)
Potassium: 4.2 mmol/L (ref 3.5–5.1)
Sodium: 137 mmol/L (ref 135–145)
Total Bilirubin: 0.6 mg/dL (ref 0.3–1.2)
Total Protein: 7.5 g/dL (ref 6.5–8.1)

## 2021-11-22 LAB — CBC
HCT: 46.6 % — ABNORMAL HIGH (ref 36.0–46.0)
Hemoglobin: 15.9 g/dL — ABNORMAL HIGH (ref 12.0–15.0)
MCH: 31.5 pg (ref 26.0–34.0)
MCHC: 34.1 g/dL (ref 30.0–36.0)
MCV: 92.3 fL (ref 80.0–100.0)
Platelets: 463 10*3/uL — ABNORMAL HIGH (ref 150–400)
RBC: 5.05 MIL/uL (ref 3.87–5.11)
RDW: 13 % (ref 11.5–15.5)
WBC: 12.3 10*3/uL — ABNORMAL HIGH (ref 4.0–10.5)
nRBC: 0 % (ref 0.0–0.2)

## 2021-11-22 LAB — HEMOGLOBIN A1C
Hgb A1c MFr Bld: 7.8 % — ABNORMAL HIGH (ref 4.8–5.6)
Mean Plasma Glucose: 177.16 mg/dL

## 2021-11-22 LAB — GLUCOSE, CAPILLARY: Glucose-Capillary: 157 mg/dL — ABNORMAL HIGH (ref 70–99)

## 2021-11-22 MED ORDER — SENNOSIDES-DOCUSATE SODIUM 8.6-50 MG PO TABS: 2.0000 | ORAL_TABLET | Freq: Every day | ORAL | 0 refills | Status: DC

## 2021-11-22 MED ORDER — TRAMADOL HCL 50 MG PO TABS: 50.0000 mg | ORAL_TABLET | Freq: Four times a day (QID) | ORAL | 0 refills | Status: DC | PRN

## 2021-11-22 NOTE — Patient Instructions (Addendum)
Preparing for your Surgery  Plan for surgery on Nov 23, 2021 with Dr. Jeral Pinch at [place] will be scheduled for mini laparotomy (slightly larger incison) for controlled ovarian cyst drainage followed by robotic assisted laparoscopic bilateral salpingo-oophorectomy (removal of both ovaries and fallopian tubes), possible robotic assisted total hysterectomy (removal of the uterus and cervix) if mass is a borderline tumor or cancer, possible staging if a cancer is identified, possible umbilical hernia repair.   Pre-operative Testing -(DONE) You will receive a phone call from presurgical testing at [facility] to arrange for a pre-operative appointment and lab work.  -Bring your insurance card, copy of an advanced directive if applicable, medication list  -At that visit, you will be asked to sign a consent for a possible blood transfusion in case a transfusion becomes necessary during surgery.  The need for a blood transfusion is rare but having consent is a necessary part of your care.     -You should not be taking blood thinners or aspirin at least ten days prior to surgery unless instructed by your surgeon.  -Do not take supplements such as fish oil (omega 3), red yeast rice, turmeric before your surgery. You want to avoid medications with aspirin in them including headache powders such as BC or Goody's), Excedrin migraine.  Day Before Surgery at [place] will be asked to take in a light diet the day before surgery. You will be advised you can have clear liquids up until 3 hours before your surgery.    Eat a light diet the day before surgery.  Examples including soups, broths, toast, yogurt, mashed potatoes.  AVOID GAS PRODUCING FOODS. Things to avoid include carbonated beverages (fizzy beverages, sodas), raw fruits and raw vegetables (uncooked), or beans.   If your bowels are filled with gas, your surgeon will have difficulty visualizing your pelvic organs which  increases your surgical risks.  You should be able to go home the same day if you are doing well.   Your role in recovery Your role is to become active as soon as directed by your doctor, while still giving yourself time to heal.  Rest when you feel tired. You will be asked to do the following in order to speed your recovery:  - Cough and breathe deeply. This helps to clear and expand your lungs and can prevent pneumonia after surgery.  - [place]. Do mild physical activity. Walking or moving your legs help your circulation and body functions return to normal. Do not try to get up or walk alone the first time after surgery.   -If you develop swelling on one leg or the other, pain in the back of your leg, redness/warmth in one of your legs, please call the office or go to the Emergency Room to have a doppler to rule out a blood clot. For shortness of breath, chest pain-seek care in the Emergency Room as soon as possible. - Actively manage your pain. Managing your pain lets you move in comfort. We will ask you to rate your pain on a scale of zero to 10. It is your responsibility to tell your doctor or nurse where and how much you hurt so your pain can be treated.  Special Considerations -If you are diabetic, you may be placed on insulin after surgery to have closer control over your blood sugars to promote healing and recovery.  This does not mean that you will be discharged on insulin.  If applicable, your oral antidiabetics will be resumed when you are tolerating a solid diet.  -Your final pathology results from surgery should be available around one week after surgery and the results will be relayed to you when available.  -Dr. Lahoma Crocker is the surgeon that assists your GYN Oncologist with surgery.  If you end up staying the night, the next day after your surgery you will either see Dr. Berline Lopes or Dr. Lahoma Crocker.  -FMLA forms can be faxed to [phone] and  please allow 5-7 business days for completion.  Pain Management After Surgery -You have been prescribed your pain medication and bowel regimen medications before surgery so that you can have these available when you are discharged from the hospital. The pain medication is for use ONLY AFTER surgery and a new prescription will not be given.   -Make sure that you have Tylenol and Ibuprofen IF YOU ARE ABLE TO TAKE THESE MEDICATIONS at home to use on a regular basis after surgery for pain control. We recommend alternating the medications every hour to six hours since they work differently and are processed in the body differently for pain relief.  -Review the attached handout on narcotic use and their risks and side effects.   Bowel Regimen -You have been prescribed Sennakot-S to take nightly to prevent constipation especially if you are taking the narcotic pain medication intermittently.  It is important to prevent constipation and drink adequate amounts of liquids. You can stop taking this medication when you are not taking pain medication and you are back on your normal bowel routine.  Risks of Surgery Risks of surgery are low but include bleeding, infection, damage to surrounding structures, re-operation, blood clots, and very rarely death.   Blood Transfusion Information (For the consent to be signed before surgery)  We will be checking your blood type before surgery so in case of emergencies, we will know what type of blood you would need.                                            WHAT IS A BLOOD TRANSFUSION?  A transfusion is the replacement of blood or some of its parts. Blood is made up of multiple cells which provide different functions. Red blood cells carry oxygen and are used for blood loss replacement. White blood cells fight against infection. Platelets control bleeding. Plasma helps clot blood. Other blood products are available for specialized needs, such as hemophilia or  other clotting disorders. BEFORE THE TRANSFUSION  Who gives blood for transfusions?  You may be able to donate blood to be used at a later date on yourself (autologous donation). Relatives can be asked to donate blood. This is generally not any safer than if you have received blood from a stranger. The same precautions are taken to ensure safety when a relative's blood is donated. Healthy volunteers who are fully evaluated to make sure their blood is safe. This is blood bank blood. Transfusion therapy is the safest it has ever been in the practice of medicine. Before blood is taken from a donor, a complete history is taken to make sure that person has no history of diseases nor engages in risky social behavior (examples are intravenous drug use or sexual activity with multiple partners). The donor's travel history is screened to minimize risk of transmitting infections, such as malaria. The donated  blood is tested for signs of infectious diseases, such as HIV and hepatitis. The blood is then tested to be sure it is compatible with you in order to minimize the chance of a transfusion reaction. If you or a relative donates blood, this is often done in anticipation of surgery and is not appropriate for emergency situations. It takes many days to process the donated blood. RISKS AND COMPLICATIONS Although transfusion therapy is very safe and saves many lives, the main dangers of transfusion include:  Getting an infectious disease. Developing a transfusion reaction. This is an allergic reaction to something in the blood you were given. Every precaution is taken to prevent this. The decision to have a blood transfusion has been considered carefully by your caregiver before blood is given. Blood is not given unless the benefits outweigh the risks.  AFTER SURGERY INSTRUCTIONS  Return to work: 4-6 weeks if applicable  You will have a white honeycomb dressing over your larger incision. This dressing can be  removed 5 days after surgery and you do not need to reapply a new dressing. Once you remove the dressing, you will notice that you have the surgical glue (dermabond) on the incision and this will peel off on its own. You can get this dressing wet in the shower the days after surgery prior to removal on the 5th day.   Activity: 1. Be up and out of the bed during the day.  Take a nap if needed.  You may walk up steps but be careful and use the hand rail.  Stair climbing will tire you more than you think, you may need to stop part way and rest.   2. No lifting or straining for 6 weeks over 10 pounds. No pushing, pulling, straining for 6 weeks.  3. No driving for around 1 week(s).  Do not drive if you are taking narcotic pain medicine and make sure that your reaction time has returned.   4. You can shower as soon as the next day after surgery. Shower daily.  Use your regular soap and water (not directly on the incision) and pat your incision(s) dry afterwards; don't rub.  No tub baths or submerging your body in water until cleared by your surgeon. If you have the soap that was given to you by pre-surgical testing that was used before surgery, you do not need to use it afterwards because this can irritate your incisions.   5. No sexual activity and nothing in the vagina for 4 weeks, 8 weeks if you have a hysterectomy.  6. You may experience a small amount of clear drainage from your incisions, which is normal.  If the drainage persists, increases, or changes color please call the office.  7. Do not use creams, lotions, or ointments such as neosporin on your incisions after surgery until advised by your surgeon because they can cause removal of the dermabond glue on your incisions.    8. You may experience vaginal spotting after surgery or around the 6-8 week mark from surgery when the stitches at the top of the vagina begin to dissolve.  The spotting is normal but if you experience heavy bleeding, call  our office.  9. Take Tylenol or ibuprofen if you are able to take these medications first for pain and only use narcotic pain medication for severe pain not relieved by the Tylenol or Ibuprofen.  Monitor your Tylenol intake to a max of 4,000 mg in a 24 hour period. You can alternate  these medications after surgery.  Diet: 1. Low sodium Heart Healthy Diet is recommended but you are cleared to resume your normal (before surgery) diet after your procedure.  2. It is safe to use a laxative, such as Miralax or Colace, if you have difficulty moving your bowels. You have been prescribed Sennakot-S to take at bedtime every evening after surgery to keep bowel movements regular and to prevent constipation.    Wound Care: 1. Keep clean and dry.  Shower daily.  Reasons to call the Doctor: Fever - Oral temperature greater than 100.4 degrees Fahrenheit Foul-smelling vaginal discharge Difficulty urinating Nausea and vomiting Increased pain at the site of the incision that is unrelieved with pain medicine. Difficulty breathing with or without chest pain New calf pain especially if only on one side Sudden, continuing increased vaginal bleeding with or without clots.   Contacts: For questions or concerns you should contact:  Dr. Jeral Pinch at [phone]  Joylene John, NP at [phone]  After Hours: call [phone] and have the GYN Oncologist paged/contacted (after 5 pm or on the weekends).  Messages sent via mychart are for non-urgent matters and are not responded to after hours so for urgent needs, please call the after hours number.

## 2021-11-22 NOTE — Progress Notes (Signed)
Patient here for a pre-operative appointment prior to her scheduled surgery on Nov 23, 2021. She is scheduled for mini laparotomy for controlled ovarian cyst drainage followed by robotic assisted laparoscopic bilateral salpingo-oophorectomy, possible robotic assisted total hysterectomy if mass is a borderline tumor or cancer, possible staging if a cancer is identified, possible umbilical hernia repair, possible laparotomy (larger incision on the abdomen if needed).  She had her pre-admission testing appointment this am at [facility].  The surgery was discussed in detail.  See after visit summary for additional details. Visual aids used to discuss items related to surgery including the incentive spirometer, sequential compression stockings, foley catheter, female reproductive system to discuss surgery in detail.      Discussed post-op pain management in detail including the aspects of the enhanced recovery pathway.  Advised her that a new prescription would be sent in for tramadol and it is only to be used for after her upcoming surgery.  We discussed the use of tylenol post-op and to monitor for a maximum of 4,000 mg in a 24 hour period.  Also prescribed sennakot to be used after surgery and to hold if having loose stools.  Discussed bowel regimen in detail.    Discussed the use of SCDs and measures to take at home to prevent DVT including frequent mobility.  Reportable signs and symptoms of DVT discussed. Post-operative instructions discussed and expectations for after surgery. Incisional care discussed as well including reportable signs and symptoms including erythema, drainage, wound separation.     5 minutes spent with the patient.  Verbalizing understanding of material discussed. No needs or concerns voiced at the end of the visit.   Advised patient to call for any needs.  Advised that her post-operative medications had been prescribed and could be picked up at any time.    This appointment is included  in the global surgical bundle as pre-operative teaching and has no charge.

## 2021-11-23 ENCOUNTER — Ambulatory Visit (HOSPITAL_COMMUNITY): Payer: 59 | Admitting: Certified Registered Nurse Anesthetist

## 2021-11-23 ENCOUNTER — Encounter (HOSPITAL_COMMUNITY): Payer: Self-pay | Admitting: Gynecologic Oncology

## 2021-11-23 ENCOUNTER — Ambulatory Visit (HOSPITAL_BASED_OUTPATIENT_CLINIC_OR_DEPARTMENT_OTHER): Payer: 59 | Admitting: Certified Registered Nurse Anesthetist

## 2021-11-23 ENCOUNTER — Other Ambulatory Visit: Payer: Self-pay

## 2021-11-23 ENCOUNTER — Ambulatory Visit (HOSPITAL_COMMUNITY)
Admission: RE | Admit: 2021-11-23 | Discharge: 2021-11-24 | Disposition: A | Discharge status: Home / Self Care | Payer: 59 | Source: Ambulatory Visit | Attending: Gynecologic Oncology | Admitting: Gynecologic Oncology

## 2021-11-23 ENCOUNTER — Encounter (HOSPITAL_COMMUNITY)
Admission: RE | Disposition: A | Discharge status: Home / Self Care | Payer: Self-pay | Source: Ambulatory Visit | Attending: Gynecologic Oncology

## 2021-11-23 DIAGNOSIS — R1900 Intra-abdominal and pelvic swelling, mass and lump, unspecified site: Secondary | ICD-10-CM

## 2021-11-23 DIAGNOSIS — D259 Leiomyoma of uterus, unspecified: Secondary | ICD-10-CM | POA: Insufficient documentation

## 2021-11-23 DIAGNOSIS — N888 Other specified noninflammatory disorders of cervix uteri: Secondary | ICD-10-CM | POA: Diagnosis not present

## 2021-11-23 DIAGNOSIS — E7800 Pure hypercholesterolemia, unspecified: Secondary | ICD-10-CM | POA: Insufficient documentation

## 2021-11-23 DIAGNOSIS — J449 Chronic obstructive pulmonary disease, unspecified: Secondary | ICD-10-CM | POA: Diagnosis not present

## 2021-11-23 DIAGNOSIS — E1142 Type 2 diabetes mellitus with diabetic polyneuropathy: Secondary | ICD-10-CM | POA: Insufficient documentation

## 2021-11-23 DIAGNOSIS — K760 Fatty (change of) liver, not elsewhere classified: Secondary | ICD-10-CM

## 2021-11-23 DIAGNOSIS — J189 Pneumonia, unspecified organism: Secondary | ICD-10-CM | POA: Diagnosis not present

## 2021-11-23 DIAGNOSIS — Z86010 Personal history of colonic polyps: Secondary | ICD-10-CM | POA: Diagnosis not present

## 2021-11-23 DIAGNOSIS — D3911 Neoplasm of uncertain behavior of right ovary: Secondary | ICD-10-CM | POA: Insufficient documentation

## 2021-11-23 DIAGNOSIS — K660 Peritoneal adhesions (postprocedural) (postinfection): Secondary | ICD-10-CM | POA: Diagnosis not present

## 2021-11-23 DIAGNOSIS — I10 Essential (primary) hypertension: Secondary | ICD-10-CM | POA: Diagnosis not present

## 2021-11-23 DIAGNOSIS — R970 Elevated carcinoembryonic antigen [CEA]: Secondary | ICD-10-CM | POA: Diagnosis not present

## 2021-11-23 DIAGNOSIS — D45 Polycythemia vera: Secondary | ICD-10-CM | POA: Diagnosis not present

## 2021-11-23 DIAGNOSIS — F17210 Nicotine dependence, cigarettes, uncomplicated: Secondary | ICD-10-CM | POA: Insufficient documentation

## 2021-11-23 DIAGNOSIS — E1165 Type 2 diabetes mellitus with hyperglycemia: Secondary | ICD-10-CM | POA: Insufficient documentation

## 2021-11-23 DIAGNOSIS — Z794 Long term (current) use of insulin: Secondary | ICD-10-CM | POA: Insufficient documentation

## 2021-11-23 DIAGNOSIS — E119 Type 2 diabetes mellitus without complications: Secondary | ICD-10-CM

## 2021-11-23 DIAGNOSIS — N840 Polyp of corpus uteri: Secondary | ICD-10-CM | POA: Diagnosis not present

## 2021-11-23 DIAGNOSIS — Z01818 Encounter for other preprocedural examination: Secondary | ICD-10-CM

## 2021-11-23 HISTORY — PX: ROBOTIC ASSISTED TOTAL HYSTERECTOMY: SHX6085

## 2021-11-23 HISTORY — PX: ROBOTIC ASSISTED BILATERAL SALPINGO OOPHERECTOMY: SHX6078

## 2021-11-23 LAB — TYPE AND SCREEN
ABO/RH(D): O POS
Antibody Screen: NEGATIVE

## 2021-11-23 LAB — GLUCOSE, CAPILLARY
Glucose-Capillary: 164 mg/dL — ABNORMAL HIGH (ref 70–99)
Glucose-Capillary: 179 mg/dL — ABNORMAL HIGH (ref 70–99)
Glucose-Capillary: 199 mg/dL — ABNORMAL HIGH (ref 70–99)

## 2021-11-23 LAB — ABO/RH: ABO/RH(D): O POS

## 2021-11-23 SURGERY — SALPINGO-OOPHORECTOMY, BILATERAL, ROBOT-ASSISTED
Anesthesia: General

## 2021-11-23 MED ORDER — MOMETASONE FURO-FORMOTEROL FUM 200-5 MCG/ACT IN AERO
2.0000 | INHALATION_SPRAY | Freq: Two times a day (BID) | RESPIRATORY_TRACT | Status: DC
  Filled 2021-11-23: qty 8.8

## 2021-11-23 MED ORDER — ALPRAZOLAM 0.5 MG PO TABS
0.5000 mg | ORAL_TABLET | Freq: Every day | ORAL | Status: DC
  Administered 2021-11-23: 0.5 mg via ORAL
  Filled 2021-11-23: qty 1

## 2021-11-23 MED ORDER — ACETAMINOPHEN 500 MG PO TABS
1000.0000 mg | ORAL_TABLET | ORAL | Status: AC
  Administered 2021-11-23: 1000 mg via ORAL

## 2021-11-23 MED ORDER — DEXAMETHASONE SODIUM PHOSPHATE 10 MG/ML IJ SOLN
INTRAMUSCULAR | Status: AC
  Filled 2021-11-23: qty 1

## 2021-11-23 MED ORDER — PROPOFOL 10 MG/ML IV BOLUS
INTRAVENOUS | Status: DC | PRN
  Administered 2021-11-23: 140 mg via INTRAVENOUS

## 2021-11-23 MED ORDER — ENOXAPARIN SODIUM 40 MG/0.4ML IJ SOSY
40.0000 mg | PREFILLED_SYRINGE | INTRAMUSCULAR | Status: DC
  Administered 2021-11-24: 40 mg via SUBCUTANEOUS
  Filled 2021-11-23: qty 0.4

## 2021-11-23 MED ORDER — POVIDONE-IODINE 10 % EX SWAB
2.0000 "application " | Freq: Once | CUTANEOUS | Status: AC
  Administered 2021-11-23: 2 via TOPICAL

## 2021-11-23 MED ORDER — LIDOCAINE 2% (20 MG/ML) 5 ML SYRINGE
INTRAMUSCULAR | Status: DC | PRN
  Administered 2021-11-23: 50 mg via INTRAVENOUS

## 2021-11-23 MED ORDER — CELECOXIB 200 MG PO CAPS: 200.0000 mg | ORAL_CAPSULE | ORAL | Status: DC

## 2021-11-23 MED ORDER — HYDROMORPHONE HCL 1 MG/ML IJ SOLN: 0.5000 mg | INTRAMUSCULAR | Status: DC | PRN

## 2021-11-23 MED ORDER — SUGAMMADEX SODIUM 200 MG/2ML IV SOLN: INTRAVENOUS | Status: DC | PRN

## 2021-11-23 MED ORDER — INSULIN ASPART 100 UNIT/ML IJ SOLN
0.0000 [IU] | Freq: Three times a day (TID) | INTRAMUSCULAR | Status: DC
  Administered 2021-11-24: 5 [IU] via SUBCUTANEOUS

## 2021-11-23 MED ORDER — INSULIN ASPART 100 UNIT/ML IJ SOLN: 0.0000 [IU] | Freq: Every day | INTRAMUSCULAR | Status: DC

## 2021-11-23 MED ORDER — IBUPROFEN 400 MG PO TABS: 600.0000 mg | ORAL_TABLET | Freq: Four times a day (QID) | ORAL | Status: DC | PRN

## 2021-11-23 MED ORDER — ROCURONIUM BROMIDE 10 MG/ML (PF) SYRINGE
PREFILLED_SYRINGE | INTRAVENOUS | Status: AC
  Filled 2021-11-23: qty 10

## 2021-11-23 MED ORDER — MIDAZOLAM HCL 2 MG/2ML IJ SOLN
INTRAMUSCULAR | Status: DC | PRN
  Administered 2021-11-23: 2 mg via INTRAVENOUS

## 2021-11-23 MED ORDER — CEFAZOLIN SODIUM-DEXTROSE 2-4 GM/100ML-% IV SOLN: 2.0000 g | INTRAVENOUS | Status: DC

## 2021-11-23 MED ORDER — LIDOCAINE HCL (PF) 2 % IJ SOLN
INTRAMUSCULAR | Status: AC
  Filled 2021-11-23: qty 5

## 2021-11-23 MED ORDER — UMECLIDINIUM BROMIDE 62.5 MCG/ACT IN AEPB
1.0000 | INHALATION_SPRAY | Freq: Every day | RESPIRATORY_TRACT | Status: DC
  Filled 2021-11-23: qty 7

## 2021-11-23 MED ORDER — BUPIVACAINE HCL 0.25 % IJ SOLN
INTRAMUSCULAR | Status: AC
  Filled 2021-11-23: qty 1

## 2021-11-23 MED ORDER — TRAMADOL HCL 50 MG PO TABS
100.0000 mg | ORAL_TABLET | Freq: Four times a day (QID) | ORAL | Status: DC | PRN
  Administered 2021-11-24: 100 mg via ORAL
  Filled 2021-11-23: qty 2

## 2021-11-23 MED ORDER — SCOPOLAMINE 1 MG/3DAYS TD PT72
1.0000 | MEDICATED_PATCH | TRANSDERMAL | Status: DC
  Administered 2021-11-23: 1.5 mg via TRANSDERMAL

## 2021-11-23 MED ORDER — ONDANSETRON HCL 4 MG PO TABS: 4.0000 mg | ORAL_TABLET | Freq: Four times a day (QID) | ORAL | Status: DC | PRN

## 2021-11-23 MED ORDER — LORATADINE 10 MG PO TABS
10.0000 mg | ORAL_TABLET | Freq: Every day | ORAL | Status: DC
  Administered 2021-11-23 – 2021-11-24 (×2): 10 mg via ORAL
  Filled 2021-11-23 (×2): qty 1

## 2021-11-23 MED ORDER — CELECOXIB 200 MG PO CAPS
ORAL_CAPSULE | ORAL | Status: AC
  Filled 2021-11-23: qty 1

## 2021-11-23 MED ORDER — ACETAMINOPHEN 500 MG PO TABS
1000.0000 mg | ORAL_TABLET | Freq: Four times a day (QID) | ORAL | Status: DC
  Administered 2021-11-24 (×3): 1000 mg via ORAL
  Filled 2021-11-23 (×3): qty 2

## 2021-11-23 MED ORDER — STERILE WATER FOR IRRIGATION IR SOLN
Status: DC | PRN
  Administered 2021-11-23: 1000 mL

## 2021-11-23 MED ORDER — BUPIVACAINE HCL 0.25 % IJ SOLN
INTRAMUSCULAR | Status: DC | PRN
  Administered 2021-11-23: 20 mL
  Administered 2021-11-23: 30 mL

## 2021-11-23 MED ORDER — OXYCODONE HCL 5 MG PO TABS
5.0000 mg | ORAL_TABLET | ORAL | Status: DC | PRN
  Administered 2021-11-23 – 2021-11-24 (×2): 5 mg via ORAL
  Filled 2021-11-23 (×2): qty 1

## 2021-11-23 MED ORDER — SUCCINYLCHOLINE CHLORIDE 200 MG/10ML IV SOSY
PREFILLED_SYRINGE | INTRAVENOUS | Status: AC
Start: 2021-11-23 — End: ?
  Filled 2021-11-23: qty 10

## 2021-11-23 MED ORDER — TIOTROPIUM BROMIDE MONOHYDRATE 18 MCG IN CAPS: 18.0000 ug | ORAL_CAPSULE | Freq: Every day | RESPIRATORY_TRACT | Status: DC

## 2021-11-23 MED ORDER — ACETAMINOPHEN 500 MG PO TABS
ORAL_TABLET | ORAL | Status: AC
  Filled 2021-11-23: qty 2

## 2021-11-23 MED ORDER — DEXAMETHASONE SODIUM PHOSPHATE 10 MG/ML IJ SOLN
INTRAMUSCULAR | Status: DC | PRN
  Administered 2021-11-23: 4 mg via INTRAVENOUS

## 2021-11-23 MED ORDER — INSULIN GLARGINE-YFGN 100 UNIT/ML ~~LOC~~ SOLN
12.0000 [IU] | Freq: Every day | SUBCUTANEOUS | Status: DC
  Administered 2021-11-23: 12 [IU] via SUBCUTANEOUS
  Filled 2021-11-23 (×2): qty 0.12

## 2021-11-23 MED ORDER — ONDANSETRON HCL 4 MG/2ML IJ SOLN
INTRAMUSCULAR | Status: AC
  Filled 2021-11-23: qty 2

## 2021-11-23 MED ORDER — ALBUTEROL SULFATE (2.5 MG/3ML) 0.083% IN NEBU: 3.0000 mL | INHALATION_SOLUTION | Freq: Four times a day (QID) | RESPIRATORY_TRACT | Status: DC | PRN

## 2021-11-23 MED ORDER — FENTANYL CITRATE (PF) 100 MCG/2ML IJ SOLN
INTRAMUSCULAR | Status: AC
  Filled 2021-11-23: qty 2

## 2021-11-23 MED ORDER — SUGAMMADEX SODIUM 200 MG/2ML IV SOLN
INTRAVENOUS | Status: DC | PRN
  Administered 2021-11-23: 200 mg via INTRAVENOUS

## 2021-11-23 MED ORDER — ORAL CARE MOUTH RINSE: 15.0000 mL | Freq: Once | OROMUCOSAL | Status: AC

## 2021-11-23 MED ORDER — ONDANSETRON HCL 4 MG/2ML IJ SOLN: 4.0000 mg | Freq: Four times a day (QID) | INTRAMUSCULAR | Status: DC | PRN

## 2021-11-23 MED ORDER — CITALOPRAM HYDROBROMIDE 20 MG PO TABS
40.0000 mg | ORAL_TABLET | Freq: Every day | ORAL | Status: DC
  Administered 2021-11-24: 40 mg via ORAL
  Filled 2021-11-23: qty 2

## 2021-11-23 MED ORDER — HYDROMORPHONE HCL 1 MG/ML IJ SOLN
INTRAMUSCULAR | Status: AC
  Administered 2021-11-23: 0.25 mg via INTRAVENOUS
  Filled 2021-11-23: qty 1

## 2021-11-23 MED ORDER — LORATADINE 10 MG PO TABS: 10.0000 mg | ORAL_TABLET | Freq: Every day | ORAL | Status: DC

## 2021-11-23 MED ORDER — PROPOFOL 10 MG/ML IV BOLUS
INTRAVENOUS | Status: AC
  Filled 2021-11-23: qty 20

## 2021-11-23 MED ORDER — HEPARIN SODIUM (PORCINE) 5000 UNIT/ML IJ SOLN
5000.0000 [IU] | INTRAMUSCULAR | Status: AC
  Administered 2021-11-23: 5000 [IU] via SUBCUTANEOUS

## 2021-11-23 MED ORDER — KCL IN DEXTROSE-NACL 20-5-0.45 MEQ/L-%-% IV SOLN
INTRAVENOUS | Status: DC
  Filled 2021-11-23 (×2): qty 1000

## 2021-11-23 MED ORDER — 0.9 % SODIUM CHLORIDE (POUR BTL) OPTIME
TOPICAL | Status: DC | PRN
  Administered 2021-11-23: 1000 mL

## 2021-11-23 MED ORDER — LIP MEDEX EX OINT
TOPICAL_OINTMENT | CUTANEOUS | Status: AC
  Administered 2021-11-23: 1
  Filled 2021-11-23: qty 7

## 2021-11-23 MED ORDER — CHLORHEXIDINE GLUCONATE 0.12 % MT SOLN
15.0000 mL | Freq: Once | OROMUCOSAL | Status: AC
  Administered 2021-11-23: 15 mL via OROMUCOSAL

## 2021-11-23 MED ORDER — DEXAMETHASONE SODIUM PHOSPHATE 4 MG/ML IJ SOLN: 4.0000 mg | INTRAMUSCULAR | Status: DC

## 2021-11-23 MED ORDER — SENNOSIDES-DOCUSATE SODIUM 8.6-50 MG PO TABS
2.0000 | ORAL_TABLET | Freq: Every day | ORAL | Status: DC
  Administered 2021-11-23: 2 via ORAL
  Filled 2021-11-23: qty 2

## 2021-11-23 MED ORDER — PHENYLEPHRINE 80 MCG/ML (10ML) SYRINGE FOR IV PUSH (FOR BLOOD PRESSURE SUPPORT)
PREFILLED_SYRINGE | INTRAVENOUS | Status: AC
  Filled 2021-11-23: qty 10

## 2021-11-23 MED ORDER — CEFAZOLIN SODIUM-DEXTROSE 2-4 GM/100ML-% IV SOLN
INTRAVENOUS | Status: AC
  Filled 2021-11-23: qty 100

## 2021-11-23 MED ORDER — MIDAZOLAM HCL 2 MG/2ML IJ SOLN
INTRAMUSCULAR | Status: AC
  Filled 2021-11-23: qty 2

## 2021-11-23 MED ORDER — FENTANYL CITRATE (PF) 100 MCG/2ML IJ SOLN
INTRAMUSCULAR | Status: DC | PRN
  Administered 2021-11-23 (×6): 50 ug via INTRAVENOUS

## 2021-11-23 MED ORDER — LACTATED RINGERS IV SOLN: INTRAVENOUS | Status: DC

## 2021-11-23 MED ORDER — BUPIVACAINE LIPOSOME 1.3 % IJ SUSP
INTRAMUSCULAR | Status: DC | PRN
  Administered 2021-11-23: 20 mL

## 2021-11-23 MED ORDER — CEFAZOLIN SODIUM-DEXTROSE 2-4 GM/100ML-% IV SOLN
2.0000 g | INTRAVENOUS | Status: AC
  Administered 2021-11-23: 2 g via INTRAVENOUS

## 2021-11-23 MED ORDER — PHENYLEPHRINE 80 MCG/ML (10ML) SYRINGE FOR IV PUSH (FOR BLOOD PRESSURE SUPPORT)
PREFILLED_SYRINGE | INTRAVENOUS | Status: DC | PRN
  Administered 2021-11-23: 160 ug via INTRAVENOUS
  Administered 2021-11-23 (×3): 80 ug via INTRAVENOUS

## 2021-11-23 MED ORDER — CELECOXIB 200 MG PO CAPS
200.0000 mg | ORAL_CAPSULE | ORAL | Status: AC
  Administered 2021-11-23: 200 mg via ORAL

## 2021-11-23 MED ORDER — GABAPENTIN 300 MG PO CAPS
ORAL_CAPSULE | ORAL | Status: AC
  Filled 2021-11-23: qty 1

## 2021-11-23 MED ORDER — BUPIVACAINE LIPOSOME 1.3 % IJ SUSP
INTRAMUSCULAR | Status: AC
  Filled 2021-11-23: qty 20

## 2021-11-23 MED ORDER — GABAPENTIN 300 MG PO CAPS
300.0000 mg | ORAL_CAPSULE | ORAL | Status: AC
  Administered 2021-11-23: 300 mg via ORAL

## 2021-11-23 MED ORDER — ATORVASTATIN CALCIUM 40 MG PO TABS
40.0000 mg | ORAL_TABLET | Freq: Every day | ORAL | Status: DC
  Administered 2021-11-24: 40 mg via ORAL
  Filled 2021-11-23: qty 1

## 2021-11-23 MED ORDER — HYDROMORPHONE HCL 1 MG/ML IJ SOLN
0.2500 mg | INTRAMUSCULAR | Status: DC | PRN
  Administered 2021-11-23: 0.25 mg via INTRAVENOUS

## 2021-11-23 MED ORDER — ONDANSETRON HCL 4 MG/2ML IJ SOLN
INTRAMUSCULAR | Status: DC | PRN
  Administered 2021-11-23: 4 mg via INTRAVENOUS

## 2021-11-23 MED ORDER — SCOPOLAMINE 1 MG/3DAYS TD PT72
MEDICATED_PATCH | TRANSDERMAL | Status: AC
  Filled 2021-11-23: qty 1

## 2021-11-23 MED ORDER — ROCURONIUM BROMIDE 10 MG/ML (PF) SYRINGE
PREFILLED_SYRINGE | INTRAVENOUS | Status: DC | PRN
  Administered 2021-11-23: 30 mg via INTRAVENOUS
  Administered 2021-11-23: 50 mg via INTRAVENOUS

## 2021-11-23 MED ORDER — LACTATED RINGERS IR SOLN
Status: DC | PRN
  Administered 2021-11-23: 1000 mL

## 2021-11-23 MED ORDER — HEPARIN SODIUM (PORCINE) 5000 UNIT/ML IJ SOLN
INTRAMUSCULAR | Status: AC
  Filled 2021-11-23: qty 1

## 2021-11-23 SURGICAL SUPPLY — 87 items
APPLICATOR SURGIFLO ENDO (HEMOSTASIS) IMPLANT
BACTOSHIELD CHG 4% 4OZ (MISCELLANEOUS) ×1
BAG LAPAROSCOPIC 12 15 PORT 16 (BASKET) IMPLANT
BAG RETRIEVAL 10 (BASKET) ×1
BAG RETRIEVAL 12/15 (BASKET) ×6
BAG URINE DRAIN 2000ML AR STRL (UROLOGICAL SUPPLIES) ×1 IMPLANT
BLADE SURG SZ10 CARB STEEL (BLADE) ×1 IMPLANT
CNTNR URN SCR LID CUP LEK RST (MISCELLANEOUS) IMPLANT
CONT SPEC 4OZ STRL OR WHT (MISCELLANEOUS) ×3
COVER BACK TABLE 60X90IN (DRAPES) ×3 IMPLANT
COVER TIP SHEARS 8 DVNC (MISCELLANEOUS) ×2 IMPLANT
COVER TIP SHEARS 8MM DA VINCI (MISCELLANEOUS) ×3
DERMABOND ADVANCED (GAUZE/BANDAGES/DRESSINGS) ×1
DERMABOND ADVANCED .7 DNX12 (GAUZE/BANDAGES/DRESSINGS) ×2 IMPLANT
DRAPE ARM DVNC X/XI (DISPOSABLE) ×8 IMPLANT
DRAPE COLUMN DVNC XI (DISPOSABLE) ×2 IMPLANT
DRAPE DA VINCI XI ARM (DISPOSABLE) ×12
DRAPE DA VINCI XI COLUMN (DISPOSABLE) ×3
DRAPE SHEET LG 3/4 BI-LAMINATE (DRAPES) ×3 IMPLANT
DRAPE SURG IRRIG POUCH 19X23 (DRAPES) ×3 IMPLANT
DRSG OPSITE POSTOP 3X4 (GAUZE/BANDAGES/DRESSINGS) ×1 IMPLANT
DRSG OPSITE POSTOP 4X6 (GAUZE/BANDAGES/DRESSINGS) IMPLANT
DRSG OPSITE POSTOP 4X8 (GAUZE/BANDAGES/DRESSINGS) IMPLANT
ELECT PENCIL ROCKER SW 15FT (MISCELLANEOUS) IMPLANT
ELECT REM PT RETURN 15FT ADLT (MISCELLANEOUS) ×3 IMPLANT
GAUZE 4X4 16PLY ~~LOC~~+RFID DBL (SPONGE) ×3 IMPLANT
GLOVE BIO SURGEON STRL SZ 6 (GLOVE) ×8 IMPLANT
GLOVE BIO SURGEON STRL SZ 6.5 (GLOVE) ×4 IMPLANT
GLOVE SURG SS PI 6.0 STRL IVOR (GLOVE) ×4 IMPLANT
GLOVE SURG SS PI 6.5 STRL IVOR (GLOVE) ×2 IMPLANT
GOWN STRL REUS W/ TWL LRG LVL3 (GOWN DISPOSABLE) ×8 IMPLANT
GOWN STRL REUS W/TWL LRG LVL3 (GOWN DISPOSABLE) ×12
HOLDER FOLEY CATH W/STRAP (MISCELLANEOUS) ×1 IMPLANT
IRRIG SUCT STRYKERFLOW 2 WTIP (MISCELLANEOUS) ×3
IRRIGATION SUCT STRKRFLW 2 WTP (MISCELLANEOUS) ×2 IMPLANT
IV LACTATED RINGERS 1000ML (IV SOLUTION) ×1 IMPLANT
KIT PROCEDURE DA VINCI SI (MISCELLANEOUS)
KIT PROCEDURE DVNC SI (MISCELLANEOUS) IMPLANT
KIT TURNOVER KIT A (KITS) IMPLANT
LIGASURE IMPACT 36 18CM CVD LR (INSTRUMENTS) ×1 IMPLANT
MANIPULATOR ADVINCU DEL 3.0 PL (MISCELLANEOUS) IMPLANT
MANIPULATOR ADVINCU DEL 3.5 PL (MISCELLANEOUS) IMPLANT
MANIPULATOR UTERINE 4.5 ZUMI (MISCELLANEOUS) ×1 IMPLANT
NDL HYPO 21X1.5 SAFETY (NEEDLE) ×2 IMPLANT
NDL SPNL 18GX3.5 QUINCKE PK (NEEDLE) IMPLANT
NEEDLE HYPO 21X1.5 SAFETY (NEEDLE) ×3 IMPLANT
NEEDLE SPNL 18GX3.5 QUINCKE PK (NEEDLE) IMPLANT
NS IRRIG 1000ML POUR BTL (IV SOLUTION) ×1 IMPLANT
OBTURATOR OPTICAL STANDARD 8MM (TROCAR) ×3
OBTURATOR OPTICAL STND 8 DVNC (TROCAR) ×2
OBTURATOR OPTICALSTD 8 DVNC (TROCAR) ×2 IMPLANT
PACK ROBOT GYN CUSTOM WL (TRAY / TRAY PROCEDURE) ×3 IMPLANT
PAD POSITIONING PINK XL (MISCELLANEOUS) ×3 IMPLANT
PENCIL SMOKE EVAC W/HOLSTER (ELECTROSURGICAL) ×1 IMPLANT
PORT ACCESS TROCAR AIRSEAL 12 (TROCAR) ×2 IMPLANT
PORT ACCESS TROCAR AIRSEAL 5M (TROCAR) ×1
SCRUB CHG 4% DYNA-HEX 4OZ (MISCELLANEOUS) ×2 IMPLANT
SEAL CANN UNIV 5-8 DVNC XI (MISCELLANEOUS) ×8 IMPLANT
SEAL XI 5MM-8MM UNIVERSAL (MISCELLANEOUS) ×12
SET TRI-LUMEN FLTR TB AIRSEAL (TUBING) ×3 IMPLANT
SPIKE FLUID TRANSFER (MISCELLANEOUS) ×3 IMPLANT
SPONGE T-LAP 18X18 ~~LOC~~+RFID (SPONGE) ×1 IMPLANT
SURGIFLO W/THROMBIN 8M KIT (HEMOSTASIS) IMPLANT
SUT MNCRL AB 4-0 PS2 18 (SUTURE) ×2 IMPLANT
SUT PDS AB 1 TP1 96 (SUTURE) ×2 IMPLANT
SUT VIC AB 0 CT1 27 (SUTURE)
SUT VIC AB 0 CT1 27XBRD ANTBC (SUTURE) IMPLANT
SUT VIC AB 2-0 CT1 27 (SUTURE) ×3
SUT VIC AB 2-0 CT1 TAPERPNT 27 (SUTURE) IMPLANT
SUT VIC AB 2-0 SH 27 (SUTURE) ×6
SUT VIC AB 2-0 SH 27X BRD (SUTURE) IMPLANT
SUT VIC AB 4-0 PS2 18 (SUTURE) ×6 IMPLANT
SYR 10ML LL (SYRINGE) IMPLANT
SYR BULB IRRIG 60ML STRL (SYRINGE) ×1 IMPLANT
SYS BAG RETRIEVAL 10MM (BASKET) ×2
SYS WOUND ALEXIS 18CM MED (MISCELLANEOUS) ×3
SYSTEM BAG RETRIEVAL 10MM (BASKET) IMPLANT
SYSTEM WOUND ALEXIS 18CM MED (MISCELLANEOUS) IMPLANT
TOWEL OR NON WOVEN STRL DISP B (DISPOSABLE) IMPLANT
TRAP SPECIMEN MUCUS 40CC (MISCELLANEOUS) IMPLANT
TRAY FOL W/BAG SLVR 16FR STRL (SET/KITS/TRAYS/PACK) IMPLANT
TRAY FOLEY MTR SLVR 16FR STAT (SET/KITS/TRAYS/PACK) ×2 IMPLANT
TRAY FOLEY W/BAG SLVR 16FR LF (SET/KITS/TRAYS/PACK) ×3
TROCAR Z-THREAD FIOS 5X100MM (TROCAR) IMPLANT
UNDERPAD 30X36 HEAVY ABSORB (UNDERPADS AND DIAPERS) ×6 IMPLANT
WATER STERILE IRR 1000ML POUR (IV SOLUTION) ×3 IMPLANT
YANKAUER SUCT BULB TIP 10FT TU (MISCELLANEOUS) ×1 IMPLANT

## 2021-11-23 NOTE — Interval H&P Note (Signed)
History and Physical Interval Note:  11/23/2021 2:13 PM  Vanessa [name]  has presented today for surgery, with the diagnosis of large pelvic mass, elevated cea level.  The various methods of treatment have been discussed with the patient and family. After consideration of risks, benefits and other options for treatment, the patient has consented to  Procedure(s): XI ROBOTIC ASSISTED BILATERAL SALPINGO OOPHORECTOMY, POSSIBLE MINI LAPAROTOMY (Bilateral) POSSIBLE XI ROBOTIC ASSISTED TOTAL HYSTERECTOMY, POSSIBLE STAGING, POSSIBLE UMBILICAL HERNIA REPAIR (N/A) as a surgical intervention.  The patient's history has been reviewed, patient examined, no change in status, stable for surgery.  I have reviewed the patient's chart and labs.  Questions were answered to the patient's satisfaction.     [name]

## 2021-11-23 NOTE — Anesthesia Preprocedure Evaluation (Signed)
Anesthesia Evaluation  Patient identified by MRN, date of birth, ID band Patient awake    Reviewed: Allergy & Precautions, NPO status , Patient's Chart, lab work & pertinent test results  Airway Mallampati: II  TM Distance: >3 FB     Dental   Pulmonary asthma , pneumonia, COPD, Current Smoker and Patient abstained from smoking.,    breath sounds clear to auscultation       Cardiovascular hypertension,  Rhythm:Regular Rate:Normal     Neuro/Psych    GI/Hepatic Neg liver ROS, GERD  ,  Endo/Other  diabetes  Renal/GU      Musculoskeletal   Abdominal   Peds  Hematology   Anesthesia Other Findings   Reproductive/Obstetrics                             Anesthesia Physical Anesthesia Plan  ASA: 3  Anesthesia Plan: General   Post-op Pain Management:    Induction: Intravenous  PONV Risk Score and Plan: 3 and Ondansetron, Dexamethasone and Midazolam  Airway Management Planned: Oral ETT  Additional Equipment:   Intra-op Plan:   Post-operative Plan: Extubation in OR  Informed Consent: I have reviewed the patients History and Physical, chart, labs and discussed the procedure including the risks, benefits and alternatives for the proposed anesthesia with the patient or authorized representative who has indicated his/her understanding and acceptance.     Dental advisory given  Plan Discussed with: CRNA and Anesthesiologist  Anesthesia Plan Comments:         Anesthesia Quick Evaluation

## 2021-11-23 NOTE — Op Note (Signed)
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   Anesthesia: GET  Urine Output: 125cc  Operative Findings: On EUA, massive fluid-filled cyst filling the abdomen.  Mini laparotomy made inferior to the umbilicus with contained cyst drainage of approximately 8 L, clear.  Upon intra-abdominal entry with the laparoscope, normal upper abdominal survey.  Adhesions of the omentum to the anterior abdominal wall from below the level of the umbilicus to several centimeters above the pubic symphysis.  Omentum also adherent to the left adnexa.  Some thickening of the infracolic omentum no discrete masses.  No ascites.  Right ovary smooth, decompressed although still with some cystic component of her mass.  Normal-appearing right fallopian tube although disrupted approximately halfway along its length.  Normal-appearing left ovary and fallopian tube.  Uterus 6-8 cm, normal in appearance.  Significant adhesion of the bladder to the cervix and lower uterine segment was noted with distortion of the anatomy due to very narrow upper vagina.  During creation of the bladder flap, there was some difficulty finding the plane and there was disruption of the bladder serosa noted.  This was oversewn.  No gross adenopathy.  No peritoneal nodularity.  Appendix normal in appearance. Cystoscopy with intact bladder  dome, efflux from bilateral ureteral orifices. 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  No areas of invasion noted although prior to final pathology.  Estimated Blood Loss:  50cc      Total IV Fluids: see I&O flowsheet         Specimens: uterus, cervix, bilateral tubes and ovaries, pelvic washings, peritoneal biopsies, omentum         Complications:  None apparent; patient tolerated the procedure well.         Disposition: PACU - hemodynamically stable.  Procedure Details  The patient was seen in the Holding Room. The risks, benefits, complications, treatment options, and expected outcomes were discussed with the patient.  The patient concurred with the proposed plan, giving informed consent.  The site of surgery properly noted/marked. The patient was identified as Vanessa [name] and the procedure verified as a Robotic-assisted bilateral salpingo-oophorectomy, staging as indicated.  After induction of anesthesia, the patient was draped and prepped in the usual sterile manner. Patient was placed in supine position after anesthesia and draped and prepped in the usual sterile manner as follows: Her arms were tucked to her side with all appropriate precautions.  The shoulders were stabilized with padded shoulder blocks applied to the acromium processes.  The patient was placed in the semi-lithotomy position in [place].  The perineum and vagina were prepped with CholoraPrep. The patient was draped after the CholoraPrep had been allowed to dry for 3 minutes.  A Time Out was held and the above information confirmed.  The urethra was prepped with Betadine. Foley catheter was placed.  OG tube placement was confirmed and to suction.   In approximately 9-76 cm infraumbilical midline incision was then made with a scalpel and carried down to the fascia with monopolar electrocautery.  The fascia was incised with monopolar electrocautery and the peritoneum entered  sharply.  Peritoneal incision was extended along the length of the skin incision.  This retractor was then placed in the incision.  Pelvic washings were then collected.  A pursestring stitch was placed on the surface of the cystic mass using 2-0 Vicryl.  The gallbladder trocar was then placed within the pursestring stitch and used to puncture the ovarian cyst for cyst drainage.  After approximately 8 L had been drained and no further fluid was appreciated, the gallbladder trocar was removed and the pursestring stitch was used to tie down and close the small incision that had been made.  The laparoscopic cap is on the Alexis retractor and a robotic trocar of the introducer was placed through the laparoscopic.  The abdomen was then insufflated.  Camera was placed through the trocar and the abdomen and pelvis were inspected.  Next, a 10 mm skin incision was made 1 cm below the subcostal margin in the midclavicular line and a 10-12 mm port was inserted under direct visualization.  At this point and all points during the procedure, the patient's intra-abdominal pressure did not exceed 15 mmHg. Next, an 8 mm skin incision were made to right and left port about 10 cm lateral to the midline incision in the right and left mid abdomen.  A fourth arm was placed on the right.  All ports were placed under direct visualization.  The patient was placed in steep Trendelenburg.  The robot was docked in the normal manner.  Attention was first turned to lysis of adhesions.  Bilateral bipolar electrocautery were used to lyse the adhesions of the omentum to the anterior abdominal wall and left adnexa, freeing the omentum.  The right peritoneum was then opened parallel to the IP ligament to open the retroperitoneal space.  The round ligament was preserved.  The ureter was noted to be along the medial leaf of the broad ligament.  The peritoneum above the ureter was incised, stretched.  The fallopian tube and utero-ovarian ligament  were then skeletonized, cauterized, and transected.  The infundibulopelvic ligament was similarly skeletonized, cauterized, and transected, freeing the right ovary.  All robotic instruments were then removed from the abdomen and the robot was undocked.  The laparoscopic System was taken off and the right ovary was brought out through the mini laparotomy and handed off the field to be sent to pathology.  The laparoscopic Was then replaced, but it robotic trocar without it inserter was placed, and insufflation of the abdomen was again achieved.  The robot was redocked, and all instruments inserted again under direct visualization.  Attention was turned to the left. The peritoneum was opened parallel to the IP ligament to open the retroperitoneal space. The round ligament was preserved. The ureter was noted to be on the medial leaf of the broad ligament.  The peritoneum above the ureter was incised and stretched and the infundibulopelvic ligament was skeletonized, cauterized and cut.  The left adnexa was placed in Endo Catch bag to be removed through the mini laparotomy incision.  Although the distal small bowel was not grasped with the ProGrasp, during some manipulation of the bowel for visualization of the right adnexa, there was concern for unseen serosal damage to a limited area of the ileum.  This bowel was inspected with no visible damage apparent.  The area was oversewn with several interrupted sutures of 2-0 Vicryl.  At this point the frozen section returned.  Decision was  made to proceed with total hysterectomy, peritoneal biopsies, and omentectomy.  A sterile speculum was placed in the vagina.  The cervix was grasped with a single-tooth tenaculum. The cervix was dilated with Kennon Rounds dilators.  Dilation of the cervix, posterior uterine perforation was noted.  The manipulator was ultimately able to be placed within the uterus.  The ZUMI uterine manipulator with a small colpotomizer ring was placed some  difficulty given very narrow vaginal apex.  A pneum occluder balloon was placed over the manipulator.  The posterior peritoneum was taken down to the level of the KOH ring.  The anterior peritoneum was also taken down.  The bladder flap was created to the level of the KOH ring.  This dissection was quite difficult, and in trying to establish the plane between the uterus/cervix and the bladder, there was some disruption of the bladder serosa.  The uterine artery on the right side was skeletonized, cauterized and cut in the normal manner.  A similar procedure was performed on the left.  The colpotomy was made and the uterus, cervix were amputated and the Endo Catch bag to be delivered through the mini laparotomy.  Pedicles were inspected and excellent hemostasis was achieved.    The colpotomy at the vaginal cuff was closed with Vicryl on a CT1 needle in a running manner.  Attention was then turned to the disrupted area of serosa.  2-0 Vicryl was used in running fashion to oversew the bladder serosa along the midline and left dome.  At this point, given complex bladder dissection, the bladder was instilled with approximately 2 cc of sterile fluid and cystoscopy was performed with findings as noted below.  Foley catheter was replaced after cystoscopy completed.  Irrigation was used and excellent hemostasis was achieved.    Continual biopsies were taken.  Robotic instruments were removed under direct visulaization.  The robot was undocked.  Laparoscopic System was removed.  The uterine cervix specimen was delivered in its Endo Catch bag as well as the left adnexa.  The omentum was then delivered through the incision and an infracolic omentectomy was performed using monopolar and bipolar electrocautery.  This was then handed off the field.  Alexis retractor was removed.  The fascial incision was closed with 1 running #1 looped PDS, tied at the apex.  The PDS knot was buried using several interrupted sutures  of 4-0 Vicryl.  The subcutaneous tissue was irrigated.  Exparel was injected for local anesthesia.  The fascia at the 10-12 mm port was closed with 0 Vicryl on a UR-5 needle.  The subcuticular tissue of all incisions was closed with 4-0 Vicryl and the skin was closed with 4-0 Monocryl in a subcuticular manner.  Dermabond was applied.  Honeycomb dressing was applied to the mini laparotomy site.  The vagina was swabbed with  minimal bleeding noted.   All sponge, lap and needle counts were correct x  3.   The patient was transferred to the recovery room in stable condition.  Jeral Pinch, MD

## 2021-11-23 NOTE — Anesthesia Procedure Notes (Signed)
Procedure Name: Intubation Date/Time: 11/23/2021 3:33 PM Performed by: Niel Hummer, CRNA Pre-anesthesia Checklist: Patient identified, Emergency Drugs available, Suction available and Patient being monitored Patient Re-evaluated:Patient Re-evaluated prior to induction Oxygen Delivery Method: Circle system utilized Preoxygenation: Pre-oxygenation with 100% oxygen Induction Type: IV induction Ventilation: Mask ventilation without difficulty and Oral airway inserted - appropriate to patient size Laryngoscope Size: Mac and 4 Grade View: Grade I Tube type: Oral Tube size: 7.0 mm Number of attempts: 1 Airway Equipment and Method: Stylet Placement Confirmation: positive ETCO2, breath sounds checked- equal and bilateral and ETT inserted through vocal cords under direct vision Secured at: 23 cm Tube secured with: Tape Dental Injury: Teeth and Oropharynx as per pre-operative assessment

## 2021-11-23 NOTE — Brief Op Note (Signed)
11/23/2021  6:47 PM  PATIENT:  [name]  61 y.o. female  PRE-OPERATIVE DIAGNOSIS:  large pelvic mass, elevated cea level  POST-OPERATIVE DIAGNOSIS:  large pelvic mass, elevated cea level  PROCEDURE:  Procedure(s): XI ROBOTIC ASSISTED BILATERAL SALPINGO OOPHORECTOMY,  MINI LAPAROTOMY (Bilateral) XI ROBOTIC ASSISTED TOTAL HYSTERECTOMY, STAGING, CYSTOSCOPY (N/A)  SURGEON:  Surgeon(s) and Role:    [name], MD - Primary    Lahoma Crocker, MD - Assisting  EBL:  50 mL   BLOOD ADMINISTERED:none  8L drained from cystic mass  DRAINS: none   LOCAL MEDICATIONS USED:  marcaine, exparel  SPECIMEN:  right adnexa, uterus, cervix, left adnexa, pelvic washings, omentum, peritoneal biopsies  DISPOSITION OF SPECIMEN:  PATHOLOGY  COUNTS:  YES  TOURNIQUET:  * No tourniquets in log *  DICTATION: .Note written in EPIC  PLAN OF CARE: Admit for overnight observation  PATIENT DISPOSITION:  PACU - hemodynamically stable.   Delay start of Pharmacological VTE agent (>24hrs) due to surgical blood loss or risk of bleeding: not applicable

## 2021-11-23 NOTE — Anesthesia Postprocedure Evaluation (Signed)
Anesthesia Post Note  Patient: Vanessa [name]  Procedure(s) Performed: XI ROBOTIC ASSISTED BILATERAL SALPINGO OOPHORECTOMY,  MINI LAPAROTOMY (Bilateral) XI ROBOTIC ASSISTED TOTAL HYSTERECTOMY, STAGING, CYSTOSCOPY     Patient location during evaluation: PACU Anesthesia Type: General Level of consciousness: awake Pain management: pain level controlled Vital Signs Assessment: post-procedure vital signs reviewed and stable Respiratory status: spontaneous breathing Cardiovascular status: stable Postop Assessment: no apparent nausea or vomiting Anesthetic complications: no   No notable events documented.  Last Vitals:  Vitals:   11/23/21 1930 11/23/21 1945  BP: (!) 169/69 (!) 160/76  Pulse: 97 (!) 101  Resp: (!) 21 17  Temp:    SpO2: 94% 94%    Last Pain:  Vitals:   11/23/21 1949  TempSrc:   PainSc: 6                  Lakela Kuba

## 2021-11-23 NOTE — Discharge Instructions (Signed)
AFTER SURGERY INSTRUCTIONS   Return to work: 4-6 weeks if applicable   You will have a white honeycomb dressing over your larger incision. This dressing can be removed 5 days after surgery and you do not need to reapply a new dressing. Once you remove the dressing, you will notice that you have the surgical glue (dermabond) on the incision and this will peel off on its own. You can get this dressing wet in the shower the days after surgery prior to removal on the 5th day.   You are going to need to take once a day Xarelto, blood thinner, to prevent blood clots after surgery. You need to start this tomorrow, November 25, 2021 and take at the same time each day. You will be on this for a total of 2 weeks.   Activity: 1. Be up and out of the bed during the day.  Take a nap if needed.  You may walk up steps but be careful and use the hand rail.  Stair climbing will tire you more than you think, you may need to stop part way and rest.    2. No lifting or straining for 6 weeks over 10 pounds. No pushing, pulling, straining for 6 weeks.   3. No driving for around 1 week(s).  Do not drive if you are taking narcotic pain medicine and make sure that your reaction time has returned.    4. You can shower as soon as the next day after surgery. Shower daily.  Use your regular soap and water (not directly on the incision) and pat your incision(s) dry afterwards; don't rub.  No tub baths or submerging your body in water until cleared by your surgeon. If you have the soap that was given to you by pre-surgical testing that was used before surgery, you do not need to use it afterwards because this can irritate your incisions.    5. No sexual activity and nothing in the vagina for 8 weeks since you had a hysterectomy.   6. You may experience a small amount of clear drainage from your incisions, which is normal.  If the drainage persists, increases, or changes color please call the office.   7. Do not use creams,  lotions, or ointments such as neosporin on your incisions after surgery until advised by your surgeon because they can cause removal of the dermabond glue on your incisions.     8. You may experience vaginal spotting after surgery or around the 6-8 week mark from surgery when the stitches at the top of the vagina begin to dissolve.  The spotting is normal but if you experience heavy bleeding, call our office.   9. Take Tylenol or ibuprofen if you are able to take these medications first for pain and only use narcotic pain medication for severe pain not relieved by the Tylenol or Ibuprofen.  Monitor your Tylenol intake to a max of 4,000 mg in a 24 hour period. You can alternate these medications after surgery.   Diet: 1. Low sodium Heart Healthy Diet is recommended but you are cleared to resume your normal (before surgery) diet after your procedure.   2. It is safe to use a laxative, such as Miralax or Colace, if you have difficulty moving your bowels. You have been prescribed Sennakot-S to take at bedtime every evening after surgery to keep bowel movements regular and to prevent constipation.     Wound Care: 1. Keep clean and dry.  Shower daily.  Reasons to call the Doctor: Fever - Oral temperature greater than 100.4 degrees Fahrenheit Foul-smelling vaginal discharge Difficulty urinating Nausea and vomiting Increased pain at the site of the incision that is unrelieved with pain medicine. Difficulty breathing with or without chest pain New calf pain especially if only on one side Sudden, continuing increased vaginal bleeding with or without clots.   Contacts: For questions or concerns you should contact:   Dr. Jeral Pinch at [phone]   Joylene John, NP at [phone]   After Hours: call [phone] and have the GYN Oncologist paged/contacted (after 5 pm or on the weekends).   Messages sent via mychart are for non-urgent matters and are not responded to after hours so for  urgent needs, please call the after hours number.

## 2021-11-23 NOTE — Transfer of Care (Signed)
Immediate Anesthesia Transfer of Care Note  Patient: Vanessa [name]  Procedure(s) Performed: Procedure(s): XI ROBOTIC ASSISTED BILATERAL SALPINGO OOPHORECTOMY,  MINI LAPAROTOMY (Bilateral) XI ROBOTIC ASSISTED TOTAL HYSTERECTOMY, STAGING, CYSTOSCOPY (N/A)  Patient Location: PACU  Anesthesia Type:General  Level of Consciousness: Alert, Awake, Oriented  Airway & Oxygen Therapy: Patient Spontanous Breathing  Post-op Assessment: Report given to RN  Post vital signs: Reviewed and stable  Last Vitals:  Vitals:   11/23/21 1255 11/23/21 1856  BP: 122/63   Pulse: 84 (!) 102  Resp: 16 20  Temp: 36.9 C   SpO2: 10% 272%    Complications: No apparent anesthesia complications

## 2021-11-24 ENCOUNTER — Other Ambulatory Visit (HOSPITAL_COMMUNITY): Payer: Self-pay

## 2021-11-24 ENCOUNTER — Telehealth: Payer: Self-pay | Admitting: *Deleted

## 2021-11-24 ENCOUNTER — Encounter (HOSPITAL_COMMUNITY): Payer: Self-pay | Admitting: Gynecologic Oncology

## 2021-11-24 ENCOUNTER — Encounter: Payer: Self-pay | Admitting: Hematology and Oncology

## 2021-11-24 DIAGNOSIS — R1900 Intra-abdominal and pelvic swelling, mass and lump, unspecified site: Secondary | ICD-10-CM | POA: Diagnosis not present

## 2021-11-24 LAB — BASIC METABOLIC PANEL
Anion gap: 7 (ref 5–15)
BUN: 12 mg/dL (ref 6–20)
CO2: 26 mmol/L (ref 22–32)
Calcium: 8.6 mg/dL — ABNORMAL LOW (ref 8.9–10.3)
Chloride: 103 mmol/L (ref 98–111)
Creatinine, Ser: 0.64 mg/dL (ref 0.44–1.00)
GFR, Estimated: >60 mL/min (ref >60)
Glucose, Bld: 270 mg/dL — ABNORMAL HIGH (ref 70–99)
Potassium: 4.8 mmol/L (ref 3.5–5.1)
Sodium: 136 mmol/L (ref 135–145)

## 2021-11-24 LAB — GLUCOSE, CAPILLARY
Glucose-Capillary: 233 mg/dL — ABNORMAL HIGH (ref 70–99)
Glucose-Capillary: 83 mg/dL (ref 70–99)

## 2021-11-24 LAB — CBC
HCT: 38.7 % (ref 36.0–46.0)
Hemoglobin: 12.6 g/dL (ref 12.0–15.0)
MCH: 30.1 pg (ref 26.0–34.0)
MCHC: 32.6 g/dL (ref 30.0–36.0)
MCV: 92.4 fL (ref 80.0–100.0)
Platelets: 379 10*3/uL (ref 150–400)
RBC: 4.19 MIL/uL (ref 3.87–5.11)
RDW: 12.7 % (ref 11.5–15.5)
WBC: 18.4 10*3/uL — ABNORMAL HIGH (ref 4.0–10.5)
nRBC: 0 % (ref 0.0–0.2)

## 2021-11-24 MED ORDER — RIVAROXABAN 10 MG PO TABS
10.0000 mg | ORAL_TABLET | Freq: Every day | ORAL | 0 refills | Status: DC
  Filled 2021-11-24: qty 28, 28d supply, fill #0

## 2021-11-24 MED ORDER — RIVAROXABAN 10 MG PO TABS
10.0000 mg | ORAL_TABLET | Freq: Every day | ORAL | 0 refills | Status: DC
  Filled 2021-11-24: qty 14, 14d supply, fill #0

## 2021-11-24 NOTE — Discharge Summary (Signed)
Physician Discharge Summary  Patient ID: Vanessa [name] MRN: [id number] DOB/AGE: Jul 27, 1960 61 y.o.  Admit date: 11/23/2021 Discharge date: 11/24/2021  Admission Diagnoses: Pelvic mass  Discharge Diagnoses:  Principal Problem:   Pelvic mass   Discharged Condition:  The patient is in good condition and stable for discharge.    Hospital Course: On 11/23/2021, the patient underwent the following: Procedure(s): XI ROBOTIC ASSISTED BILATERAL SALPINGO OOPHORECTOMY, MINI LAPAROTOMY, XI ROBOTIC ASSISTED TOTAL HYSTERECTOMY, STAGING, CYSTOSCOPY. The postoperative course was uneventful.  She was discharged to home on postoperative day 1 tolerating a regular diet, ambulating, pain controlled, voiding without difficulty.   Consults: None  Significant Diagnostic Studies: AM Labs  Treatments: surgery: see above  Discharge Exam (am assessment by Dr. Berline Lopes): Blood pressure (!) 117/54, pulse 91, temperature 98.3 F (36.8 C), temperature source Oral, resp. rate 18, height 5' 0.2" (1.529 m), weight 151 lb 3.8 oz (68.6 kg), last menstrual period 12/24/2013, SpO2 93 %. General: alert, cooperative, and no distress Resp: lungs clear except for mild expiratory wheezing Cardio: regular rate and rhythm, S1, S2 normal, no murmur, click, rub or gallop GI: incision: lap sites to the abdomen with dermabond intact with no drainage, mini lap incision with op site dressing intact with no drainage noted underneath and abdomen soft, appropriately tender, active bowel sounds Extremities: extremities normal, atraumatic, no cyanosis or edema  Disposition: Discharge disposition: 01-Home or Self Care       Discharge Instructions     Call MD for:  difficulty breathing, headache or visual disturbances   Complete by: As directed    Call MD for:  extreme fatigue   Complete by: As directed    Call MD for:  hives   Complete by: As directed    Call MD for:  persistant dizziness or light-headedness   Complete by: As  directed    Call MD for:  persistant nausea and vomiting   Complete by: As directed    Call MD for:  redness, tenderness, or signs of infection (pain, swelling, redness, odor or green/yellow discharge around incision site)   Complete by: As directed    Call MD for:  severe uncontrolled pain   Complete by: As directed    Call MD for:  temperature >100.4   Complete by: As directed    Diet - low sodium heart healthy   Complete by: As directed    Discharge wound care:   Complete by: As directed    You will have a white honeycomb dressing over your larger incision. This dressing can be removed 5 days after surgery and you do not need to reapply a new dressing. Once you remove the dressing, you will notice that you have the surgical glue (dermabond) on the incision and this will peel off on its own. You can get this dressing wet in the shower the days after surgery prior to removal on the 5th day.   Driving Restrictions   Complete by: As directed    No driving for around 1 week(s).  Do not take narcotics and drive. You need to make sure your reaction time has returned.   Increase activity slowly   Complete by: As directed    Lifting restrictions   Complete by: As directed    No lifting greater than 10 lbs, pushing, pulling, straining for 6 weeks.   Sexual Activity Restrictions   Complete by: As directed    No sexual activity, nothing in the vagina, for 8 weeks.  Allergies as of 11/24/2021       Reactions   Jardiance [empagliflozin] Other (See Comments)   Severe yeast infection   Latex Rash   Avandia [rosiglitazone]    Headaches        Medication List     STOP taking these medications    aspirin 81 MG tablet       TAKE these medications    acetaminophen 500 MG tablet Commonly known as: TYLENOL Take 1,000 mg by mouth every 6 (six) hours as needed for mild pain.   Advair Diskus 250-50 MCG/ACT Aepb Generic drug: fluticasone-salmeterol INHALE 1 INHALATION BY  MOUTH  INTO THE LUNGS IN THE MORNING AND AT BEDTIME   albuterol 108 (90 Base) MCG/ACT inhaler Commonly known as: VENTOLIN HFA INHALE 1 TO 2 PUFFS BY MOUTH EVERY 6 HOURS AS NEEDED FOR WHEEZING FOR SHORTNESS OF BREATH   ALPRAZolam 0.5 MG tablet Commonly known as: XANAX TAKE 1 TABLET BY MOUTH AT BEDTIME AS NEEDED FOR ANXIETY What changed:  how much to take how to take this when to take this additional instructions   atorvastatin 40 MG tablet Commonly known as: LIPITOR Take 40 mg by mouth daily.   cetirizine 10 MG tablet Commonly known as: ZYRTEC Take 1 tablet (10 mg total) by mouth daily.   cimetidine 200 MG tablet Commonly known as: TAGAMET Take 200 mg by mouth 2 (two) times daily.   citalopram 40 MG tablet Commonly known as: CELEXA Take 1 tablet (40 mg total) by mouth daily.   dicyclomine 20 MG tablet Commonly known as: BENTYL Take 1 tablet (20 mg total) by mouth 4 (four) times daily -  before meals and at bedtime.   ezetimibe 10 MG tablet Commonly known as: Zetia Take 1 tablet (10 mg total) by mouth daily.   fluticasone 50 MCG/ACT nasal spray Commonly known as: Flonase Place 2 sprays into both nostrils daily.   glucose blood test strip Use as instructed   hydrOXYzine 100 MG capsule Commonly known as: VISTARIL Take 100 mg by mouth in the morning, at noon, and at bedtime.   lisinopril 10 MG tablet Commonly known as: ZESTRIL Take 1 tablet (10 mg total) by mouth daily.   senna-docusate 8.6-50 MG tablet Commonly known as: Senokot-S Take 2 tablets by mouth at bedtime. For AFTER surgery, do not take if having diarrhea   Spiriva HandiHaler 18 MCG inhalation capsule Generic drug: tiotropium PLACE ONE CAPSULE INTO INHALER AND INHALE DAILY   traMADol 50 MG tablet Commonly known as: ULTRAM Take 1 tablet (50 mg total) by mouth every 6 (six) hours as needed for severe pain. For AFTER surgery only, do not take and drive   Xarelto 10 MG Tabs tablet Generic drug:  rivaroxaban Take 1 tablet (10 mg total) by mouth daily. Start taking on: November 25, 2021               Discharge Care Instructions  (From admission, onward)           Start     Ordered   11/24/21 0000  Discharge wound care:       Comments: You will have a white honeycomb dressing over your larger incision. This dressing can be removed 5 days after surgery and you do not need to reapply a new dressing. Once you remove the dressing, you will notice that you have the surgical glue (dermabond) on the incision and this will peel off on its own. You can get this dressing wet in  the shower the days after surgery prior to removal on the 5th day.   11/24/21 1240            Follow-up Information     [name], MD Follow up on 11/29/2021.   Specialty: Gynecologic Oncology Why: at 4:40pm will be a PHONE visit to check in with Dr. Berline Lopes and discuss final pathology. IN PERSON visit will be on 12/23/21 at 3:45pm at the [facility]. Contact information: [address] [place] [address] [phone]                 Greater than thirty minutes were spend for face to face discharge instructions and discharge orders/summary in EPIC.   Signed: Dorothyann Gibbs 11/24/2021, 1:33 PM

## 2021-11-24 NOTE — Telephone Encounter (Signed)
Spoke to pt this morning to inform her that Vanessa John, Np sent in a prescription for Xeralto for 14 days. The pharmacy will deliver it to her inpatient room and bill her $35. Pt stated she is fine with the delivery method and cost.

## 2021-11-24 NOTE — Progress Notes (Signed)
1 Day Post-Op Procedure(s) (LRB): XI ROBOTIC ASSISTED BILATERAL SALPINGO OOPHORECTOMY,  MINI LAPAROTOMY (Bilateral) XI ROBOTIC ASSISTED TOTAL HYSTERECTOMY, STAGING, CYSTOSCOPY (N/A)  Subjective: Patient reports mild soreness in chest. No nausea or emesis. Tolerating diet. Voiding since catheter removal. No flatus. Pain is managed with prn medications. Steady on feet when ambulating. No shortness of breath. Patient was eating graham crackers throughout the night. No concerns voiced.   Objective: Vital signs in last 24 hours: Temp:  [97.7 F (36.5 C)-98.5 F (36.9 C)] 98.5 F (36.9 C) (06/01 0500) Pulse Rate:  [84-109] 95 (06/01 0500) Resp:  [16-21] 18 (06/01 0500) BP: (118-175)/(42-98) 118/42 (06/01 0500) SpO2:  [91 %-100 %] 95 % (06/01 0500) Weight:  [151 lb 3.8 oz (68.6 kg)] 151 lb 3.8 oz (68.6 kg) (05/31 1316) Last BM Date : 11/21/21  Intake/Output from previous day: 05/31 0701 - 06/01 0700 In: 3574.4 [P.O.:1110; I.V.:2364.4; IV Piggyback:100] Out: 0272 [ZDGUY:4034; Blood:50]  Physical Examination (performed by Dr. Berline Lopes): General: alert, cooperative, and no distress Resp: lungs clear except for mild expiratory wheezing Cardio: regular rate and rhythm, S1, S2 normal, no murmur, click, rub or gallop GI: incision: lap sites to the abdomen with dermabond intact with no drainage, mini lap incision with op site dressing intact with no drainage noted underneath and abdomen soft, appropriately tender, active bowel sounds Extremities: extremities normal, atraumatic, no cyanosis or edema  Labs: WBC/Hgb/Hct/Plts:  18.4/12.6/38.7/379 (06/01 0446) BUN/Cr/glu/ALT/AST/amyl/lip:  12/0.64/--/--/--/--/-- (06/01 0446)  Assessment: 61 y.o. s/p Procedure(s): XI ROBOTIC ASSISTED BILATERAL SALPINGO OOPHORECTOMY,  MINI LAPAROTOMY XI ROBOTIC ASSISTED TOTAL HYSTERECTOMY, STAGING, CYSTOSCOPY: stable Pain:  Pain is well-controlled on PRN medications.  Heme: Hgb 12.6 and Hct 38.7 this am- appropriate  compared with preop values and surgical losses.  ID: WBC 18.4 this am. Received decadron intraop. No evidence of infection.  CV: BP and HR stable. Continue to monitor with ordered vital sign assessments.  GI:  Tolerating po: Yes. Antiemetics ordered if needed.  GU: Voiding since foley removal. Creatinine 0.64 this am. Adequate output reported overnight.  FEN: No critical labs on am Bmet.  Endo: Diabetes mellitus Type II, under fair control..  CBG: CBG (last 3)  Recent Labs    11/23/21 1859 11/23/21 2129 11/24/21 0729  GLUCAP 179* 199* 233*     Prophylaxis: Lovenox ordered. SCDs.  Plan: IV to saline lock If blood glucose levels improve and meeting milestones, plan for possible discharge later today Patient will need to be on home DVT prophylaxis (xarelto sent to [facility] to begin tomorrow) Encourage IS, ambulating Continue plan of care per Dr. Berline Lopes When discharged, she will be discharged home   LOS: 0 days    Ameya Vowell D Linley Moxley 11/24/2021, 8:50 AM

## 2021-11-24 NOTE — Progress Notes (Signed)
  Transition of Care [facility]) Screening Note   Patient Details  Name: Vanessa [name] Date of Birth: [birth year]   Transition of Care [facility]) CM/SW Contact:    Lennart Pall, LCSW Phone Number: 11/24/2021, 11:35 AM    Transition of Care Department [facility]) has reviewed patient and no TOC needs have been identified at this time. We will continue to monitor patient advancement through interdisciplinary progression rounds. If new patient transition needs arise, please place a TOC consult.

## 2021-11-24 NOTE — Plan of Care (Signed)
Instructions were reviewed with patient. All questions were answered. Patient was transported to main entrance by wheelchair. ° °

## 2021-11-25 ENCOUNTER — Telehealth: Payer: Self-pay | Admitting: *Deleted

## 2021-11-25 LAB — SURGICAL PATHOLOGY

## 2021-11-25 NOTE — Telephone Encounter (Signed)
Spoke with Vanessa [name] this morning. She states she is eating, drinking and urinating well. She has not had a BM yet but is passing gas. She stated she is walking around a lot to help with the gas. She is not taking senokot as prescribed and encouraged her to drink plenty of water. Encourage pt to go buy some senokot and take it and she can also take miralax as well to help her have a BM. She denies fever or chills. Incisions are dry and intact. She states she's not having pain but more soreness in her abdomen area. The soreness is an 8/10. Her pain is controlled with Tylenol and Tramadol.     Instructed to call office with any fever, chills, purulent drainage, uncontrolled pain or any other questions or concerns. Patient verbalizes understanding.   Pt aware of post op appointments as well as the office number [phone] and after hours number [phone] to call if she has any questions or concerns

## 2021-11-25 NOTE — Telephone Encounter (Signed)
Fax FMLA paperwork  

## 2021-11-29 ENCOUNTER — Encounter: Payer: Self-pay | Admitting: Gynecologic Oncology

## 2021-11-29 ENCOUNTER — Inpatient Hospital Stay: Payer: 59 | Attending: Gynecologic Oncology | Admitting: Gynecologic Oncology

## 2021-11-29 DIAGNOSIS — K219 Gastro-esophageal reflux disease without esophagitis: Secondary | ICD-10-CM | POA: Insufficient documentation

## 2021-11-29 DIAGNOSIS — J449 Chronic obstructive pulmonary disease, unspecified: Secondary | ICD-10-CM | POA: Insufficient documentation

## 2021-11-29 DIAGNOSIS — E1121 Type 2 diabetes mellitus with diabetic nephropathy: Secondary | ICD-10-CM | POA: Insufficient documentation

## 2021-11-29 DIAGNOSIS — Z90722 Acquired absence of ovaries, bilateral: Secondary | ICD-10-CM

## 2021-11-29 DIAGNOSIS — E785 Hyperlipidemia, unspecified: Secondary | ICD-10-CM | POA: Insufficient documentation

## 2021-11-29 DIAGNOSIS — E7800 Pure hypercholesterolemia, unspecified: Secondary | ICD-10-CM | POA: Insufficient documentation

## 2021-11-29 DIAGNOSIS — D3911 Neoplasm of uncertain behavior of right ovary: Secondary | ICD-10-CM | POA: Insufficient documentation

## 2021-11-29 DIAGNOSIS — Z90710 Acquired absence of both cervix and uterus: Secondary | ICD-10-CM

## 2021-11-29 DIAGNOSIS — D751 Secondary polycythemia: Secondary | ICD-10-CM | POA: Insufficient documentation

## 2021-11-29 DIAGNOSIS — F17210 Nicotine dependence, cigarettes, uncomplicated: Secondary | ICD-10-CM | POA: Insufficient documentation

## 2021-11-29 DIAGNOSIS — I10 Essential (primary) hypertension: Secondary | ICD-10-CM | POA: Insufficient documentation

## 2021-11-29 DIAGNOSIS — F32A Depression, unspecified: Secondary | ICD-10-CM | POA: Insufficient documentation

## 2021-11-29 DIAGNOSIS — Z79899 Other long term (current) drug therapy: Secondary | ICD-10-CM | POA: Insufficient documentation

## 2021-11-29 DIAGNOSIS — R1900 Intra-abdominal and pelvic swelling, mass and lump, unspecified site: Secondary | ICD-10-CM

## 2021-11-29 LAB — CYTOLOGY - NON PAP

## 2021-11-29 NOTE — Progress Notes (Signed)
Gynecologic Oncology Telehealth Note: Gyn-Onc  I connected with [name] on 11/29/21 at  4:40 PM EDT by telephone and verified that I am speaking with the correct person using two identifiers.  I discussed the limitations, risks, security and privacy concerns of performing an evaluation and management service by telemedicine and the availability of in-person appointments. I also discussed with the patient that there may be a patient responsible charge related to this service. The patient expressed understanding and agreed to proceed.  Other persons participating in the visit and their role in the encounter: None.  Patient's location: Home Provider's location: [facility]  Reason for Visit: Follow-up after surgery, treatment discussion  Treatment History: 11/23/21: Robotic TLH/BSO, staging in the setting of adnexal mass, frozen section c/w at least borderline tumor  Interval History: Little sore, no pain. Endorses normal bowel function. Denies urinary symptoms. Denies vaginal bleeding or discharge. Appetite good, no nausea or emesis.   Past Medical/Surgical History: Past Medical History:  Diagnosis Date   Asthma    No PFT performed   Blood transfusion without reported diagnosis    has to donate blood due to having "thick" blood.   COPD (chronic obstructive pulmonary disease) (HCC)    Depression    Diabetes mellitus 2002   GERD (gastroesophageal reflux disease)    Helicobacter pylori (H. pylori) infection    s/p triple therapy   Hepatic hemangioma    History of kidney stones    Hyperlipidemia    Hypertension    Hypertriglyceridemia    Panic attacks    mostly Agaraphobia   Pneumonia    Ventral hernia     Past Surgical History:  Procedure Laterality Date   CESAREAN SECTION     with 3 children   COLONOSCOPY  2018   OVARIAN CYST REMOVAL     Age 5   ROBOTIC ASSISTED BILATERAL SALPINGO OOPHERECTOMY Bilateral 11/23/2021   Procedure: XI ROBOTIC ASSISTED BILATERAL  SALPINGO OOPHORECTOMY,  MINI LAPAROTOMY;  Surgeon: [name], MD;  Location: [facility];  Service: Gynecology;  Laterality: Bilateral;   ROBOTIC ASSISTED TOTAL HYSTERECTOMY N/A 11/23/2021   Procedure: XI ROBOTIC ASSISTED TOTAL HYSTERECTOMY, STAGING, CYSTOSCOPY;  Surgeon: [name], MD;  Location: [facility];  Service: Gynecology;  Laterality: N/A;   TUBAL LIGATION     UPPER GASTROINTESTINAL ENDOSCOPY  2018    Family History  Problem Relation Age of Onset   Colon polyps Mother        benign   Ovarian cancer Mother    Breast cancer Maternal Aunt    Diabetes Maternal Uncle    Colon cancer Neg Hx    Stomach cancer Neg Hx    Endometrial cancer Neg Hx    Prostate cancer Neg Hx    Pancreatic cancer Neg Hx    Esophageal cancer Neg Hx    Rectal cancer Neg Hx     Social History   Socioeconomic History   Marital status: Married    Spouse name: Not on file   Number of children: Not on file   Years of education: Not on file   Highest education level: Not on file  Occupational History   Not on file  Tobacco Use   Smoking status: Every Day    Packs/day: 1.00    Years: 43.00    Pack years: 43.00    Types: Cigarettes   Smokeless tobacco: Never  Vaping Use   Vaping Use: Never used  Substance and Sexual Activity   Alcohol  use: No    Alcohol/week: 0.0 standard drinks    Comment: none   Drug use: No   Sexual activity: Not Currently  Other Topics Concern   Not on file  Social History Narrative   Drinks at least a pot of coffee every day.    Social Determinants of Health   Financial Resource Strain: Not on file  Food Insecurity: Not on file  Transportation Needs: Not on file  Physical Activity: Not on file  Stress: Not on file  Social Connections: Not on file    Current Medications:  Current Outpatient Medications:    acetaminophen (TYLENOL) 500 MG tablet, Take 1,000 mg by mouth every 6 (six) hours as needed for mild pain., Disp: , Rfl:    ADVAIR DISKUS 250-50  MCG/ACT AEPB, INHALE 1 INHALATION BY  MOUTH INTO THE LUNGS IN THE MORNING AND AT BEDTIME, Disp: 180 each, Rfl: 3   albuterol (VENTOLIN HFA) 108 (90 Base) MCG/ACT inhaler, INHALE 1 TO 2 PUFFS BY MOUTH EVERY 6 HOURS AS NEEDED FOR WHEEZING FOR SHORTNESS OF BREATH, Disp: 9 g, Rfl: 11   ALPRAZolam (XANAX) 0.5 MG tablet, TAKE 1 TABLET BY MOUTH AT BEDTIME AS NEEDED FOR ANXIETY (Patient taking differently: Take 0.5 mg by mouth at bedtime.), Disp: 30 tablet, Rfl: 0   atorvastatin (LIPITOR) 40 MG tablet, Take 40 mg by mouth daily., Disp: , Rfl:    cetirizine (ZYRTEC) 10 MG tablet, Take 1 tablet (10 mg total) by mouth daily., Disp: 30 tablet, Rfl: 2   cimetidine (TAGAMET) 200 MG tablet, Take 200 mg by mouth 2 (two) times daily., Disp: , Rfl:    citalopram (CELEXA) 40 MG tablet, Take 1 tablet (40 mg total) by mouth daily., Disp: 90 tablet, Rfl: 0   dicyclomine (BENTYL) 20 MG tablet, Take 1 tablet (20 mg total) by mouth 4 (four) times daily -  before meals and at bedtime., Disp: 90 tablet, Rfl: 0   ezetimibe (ZETIA) 10 MG tablet, Take 1 tablet (10 mg total) by mouth daily., Disp: 90 tablet, Rfl: 2   fluticasone (FLONASE) 50 MCG/ACT nasal spray, Place 2 sprays into both nostrils daily. (Patient not taking: Reported on 06/17/2020), Disp: 15.8 mL, Rfl: 2   glucose blood test strip, Use as instructed, Disp: 100 each, Rfl: 12   hydrOXYzine (VISTARIL) 100 MG capsule, Take 100 mg by mouth in the morning, at noon, and at bedtime., Disp: , Rfl:    lisinopril (ZESTRIL) 10 MG tablet, Take 1 tablet (10 mg total) by mouth daily., Disp: 30 tablet, Rfl: 0   rivaroxaban (XARELTO) 10 MG TABS tablet, Take 1 tablet (10 mg total) by mouth daily., Disp: 14 tablet, Rfl: 0   senna-docusate (SENOKOT-S) 8.6-50 MG tablet, Take 2 tablets by mouth at bedtime. For AFTER surgery, do not take if having diarrhea, Disp: 30 tablet, Rfl: 0   tiotropium (SPIRIVA HANDIHALER) 18 MCG inhalation capsule, PLACE ONE CAPSULE INTO INHALER AND INHALE DAILY,  Disp: 30 capsule, Rfl: 0   traMADol (ULTRAM) 50 MG tablet, Take 1 tablet (50 mg total) by mouth every 6 (six) hours as needed for severe pain. For AFTER surgery only, do not take and drive, Disp: 10 tablet, Rfl: 0  Review of Symptoms: Pertinent positives as per HPI.  Physical Exam: LMP 12/24/2013  Deferred given limitations of phone visit.  Laboratory & Radiologic Studies: A. FALLOPIAN TUBE  OVARY, RIGHT, SALPINGECTOMY:  Borderline mucinous tumor of an ovary.  There are no diagnostic features of invasive malignancy.  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   B. UTERUS, CERVIX, LEFT TUBE AND OVARY:  (Weight: 56 grams.)  Leiomyomata of the uterus.  Negative for malignancy in the myomas.  Benign endometrial polyp.  Atrophic endometrium.  Negative for endometrial hyperplasia and malignancy.  Uterine cervix with a few Nabothian cysts.  Negative for SIL and glandular neoplasia in the uterine cervix.  Accompanying left ovary with a calcified corpus albicans.  Normal accompanying fallopian tube.   C. PARACOLIC GUTTER, RIGHT, BIOPSY:  Benign adipose tissue with overlying normal peritoneum.  Negative for neoplasm.   D. PARACOLIC GUTTER, LEFT, BIOPSY:  Normal peritoneum and adjacent normal adipose tissue.  Negative for neoplasm.   E. PELVIC SIDEWALL, LEFT, BIOPSY:  Normal peritoneum and adjacent normal adipose tissue.  Negative for neoplasm.   F. PELVIC SIDEWALL, RIGHT, BIOPSY:  Normal peritoneum and adjacent normal adipose tissue.  Negative for neoplasm.    I.  CUL DE SAC, ANTERIOR, BIOPSY:  Normal peritoneum and adjacent normal adipose tissue.  Negative for neoplasm.   J. OMENTUM:  A portion of omentum with moderate vascular ectasia.  Negative for neoplasm.   Assessment & Plan: Vanessa [name] is a 61 y.o. 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  Patient is doing well and meeting postoperative  milestones.  Reviewed continued expectations.  Discussed final pathology.  Patient be with news regarding borderline tumor.  Suspect this was the cause of her elevated CEA.  We will plan to repeat this at her in person postoperative visit.  Discussed that no adjuvant treatment is recommended but that we will have surveillance visits more frequently due to the borderline tumor diagnosis.  I discussed the assessment and treatment plan with the patient. The patient was provided with an opportunity to ask questions and all were answered. The patient agreed with the plan and demonstrated an understanding of the instructions.   The patient was advised to call back or see an in-person evaluation if the symptoms worsen or if the condition fails to improve as anticipated.   12 minutes of total time was spent for this patient encounter, including preparation, telephone counseling with the patient and coordination of care, and documentation of the encounter.   Jeral Pinch, MD  Division of Gynecologic Oncology  Department of Obstetrics and Gynecology  [facility]

## 2021-11-30 ENCOUNTER — Telehealth: Payer: Self-pay | Admitting: *Deleted

## 2021-11-30 NOTE — Telephone Encounter (Signed)
Attempted to reach the patient to schedule a lab appt with her post op appt. LMOM to call the office back. Labs can be drawn either on the 6/263 or 6/30; it's up to the patient.

## 2021-11-30 NOTE — Telephone Encounter (Signed)
Patient stated she would like all labs to be drawn together on 6/23.

## 2021-12-02 ENCOUNTER — Other Ambulatory Visit: Payer: Self-pay | Admitting: Hematology and Oncology

## 2021-12-02 DIAGNOSIS — C569 Malignant neoplasm of unspecified ovary: Secondary | ICD-10-CM | POA: Insufficient documentation

## 2021-12-05 ENCOUNTER — Telehealth: Payer: Self-pay | Admitting: Genetic Counselor

## 2021-12-05 NOTE — Telephone Encounter (Signed)
Called patient to discuss genetics referral per Dr. Alvy Bimler.  Appt scheduled 6/23 at 10am before her other appts that day.

## 2021-12-07 ENCOUNTER — Ambulatory Visit: Payer: 59 | Admitting: Physician Assistant

## 2021-12-16 ENCOUNTER — Inpatient Hospital Stay: Payer: 59

## 2021-12-16 ENCOUNTER — Inpatient Hospital Stay (HOSPITAL_BASED_OUTPATIENT_CLINIC_OR_DEPARTMENT_OTHER): Payer: 59 | Admitting: Hematology and Oncology

## 2021-12-16 ENCOUNTER — Encounter: Payer: Self-pay | Admitting: Genetic Counselor

## 2021-12-16 ENCOUNTER — Encounter: Payer: Self-pay | Admitting: Hematology and Oncology

## 2021-12-16 ENCOUNTER — Inpatient Hospital Stay (HOSPITAL_BASED_OUTPATIENT_CLINIC_OR_DEPARTMENT_OTHER): Payer: 59 | Admitting: Genetic Counselor

## 2021-12-16 ENCOUNTER — Other Ambulatory Visit: Payer: Self-pay

## 2021-12-16 ENCOUNTER — Other Ambulatory Visit: Payer: Self-pay | Admitting: Genetic Counselor

## 2021-12-16 DIAGNOSIS — Z8041 Family history of malignant neoplasm of ovary: Secondary | ICD-10-CM

## 2021-12-16 DIAGNOSIS — E1121 Type 2 diabetes mellitus with diabetic nephropathy: Secondary | ICD-10-CM | POA: Diagnosis not present

## 2021-12-16 DIAGNOSIS — E7800 Pure hypercholesterolemia, unspecified: Secondary | ICD-10-CM | POA: Diagnosis not present

## 2021-12-16 DIAGNOSIS — J449 Chronic obstructive pulmonary disease, unspecified: Secondary | ICD-10-CM | POA: Diagnosis not present

## 2021-12-16 DIAGNOSIS — I10 Essential (primary) hypertension: Secondary | ICD-10-CM | POA: Diagnosis not present

## 2021-12-16 DIAGNOSIS — K219 Gastro-esophageal reflux disease without esophagitis: Secondary | ICD-10-CM | POA: Diagnosis not present

## 2021-12-16 DIAGNOSIS — D3911 Neoplasm of uncertain behavior of right ovary: Secondary | ICD-10-CM

## 2021-12-16 DIAGNOSIS — Z803 Family history of malignant neoplasm of breast: Secondary | ICD-10-CM | POA: Diagnosis not present

## 2021-12-16 DIAGNOSIS — E785 Hyperlipidemia, unspecified: Secondary | ICD-10-CM | POA: Diagnosis not present

## 2021-12-16 DIAGNOSIS — F32A Depression, unspecified: Secondary | ICD-10-CM | POA: Diagnosis not present

## 2021-12-16 DIAGNOSIS — Z79899 Other long term (current) drug therapy: Secondary | ICD-10-CM | POA: Diagnosis not present

## 2021-12-16 DIAGNOSIS — C569 Malignant neoplasm of unspecified ovary: Secondary | ICD-10-CM

## 2021-12-16 DIAGNOSIS — D751 Secondary polycythemia: Secondary | ICD-10-CM | POA: Diagnosis present

## 2021-12-16 DIAGNOSIS — F17200 Nicotine dependence, unspecified, uncomplicated: Secondary | ICD-10-CM | POA: Diagnosis not present

## 2021-12-16 DIAGNOSIS — F17210 Nicotine dependence, cigarettes, uncomplicated: Secondary | ICD-10-CM | POA: Diagnosis not present

## 2021-12-16 LAB — GENETIC SCREENING ORDER

## 2021-12-16 NOTE — Assessment & Plan Note (Signed)
She was found to have borderline tumor that would not require adjuvant treatment She has appointment to follow-up with her surgeon next week Genetic testing is pending

## 2021-12-18 ENCOUNTER — Other Ambulatory Visit: Payer: Self-pay | Admitting: Student

## 2021-12-18 DIAGNOSIS — J449 Chronic obstructive pulmonary disease, unspecified: Secondary | ICD-10-CM

## 2021-12-20 ENCOUNTER — Encounter: Payer: Self-pay | Admitting: Gynecologic Oncology

## 2021-12-21 ENCOUNTER — Encounter: Payer: 59 | Admitting: Student

## 2021-12-22 ENCOUNTER — Other Ambulatory Visit (HOSPITAL_COMMUNITY): Payer: Self-pay

## 2021-12-22 ENCOUNTER — Other Ambulatory Visit: Payer: Self-pay | Admitting: Gynecologic Oncology

## 2021-12-22 DIAGNOSIS — D751 Secondary polycythemia: Secondary | ICD-10-CM

## 2021-12-22 DIAGNOSIS — D3911 Neoplasm of uncertain behavior of right ovary: Secondary | ICD-10-CM

## 2021-12-23 ENCOUNTER — Inpatient Hospital Stay (HOSPITAL_BASED_OUTPATIENT_CLINIC_OR_DEPARTMENT_OTHER): Payer: 59 | Admitting: Gynecologic Oncology

## 2021-12-23 ENCOUNTER — Inpatient Hospital Stay: Payer: 59

## 2021-12-23 ENCOUNTER — Other Ambulatory Visit: Payer: Self-pay

## 2021-12-23 VITALS — BP 134/56 | HR 99 | Resp 16 | Ht 62.0 in | Wt 139.8 lb

## 2021-12-23 DIAGNOSIS — D751 Secondary polycythemia: Secondary | ICD-10-CM | POA: Diagnosis not present

## 2021-12-23 DIAGNOSIS — D3911 Neoplasm of uncertain behavior of right ovary: Secondary | ICD-10-CM

## 2021-12-23 DIAGNOSIS — Z90710 Acquired absence of both cervix and uterus: Secondary | ICD-10-CM

## 2021-12-23 DIAGNOSIS — R970 Elevated carcinoembryonic antigen [CEA]: Secondary | ICD-10-CM

## 2021-12-23 DIAGNOSIS — Z90722 Acquired absence of ovaries, bilateral: Secondary | ICD-10-CM

## 2021-12-23 DIAGNOSIS — Z7189 Other specified counseling: Secondary | ICD-10-CM

## 2021-12-23 DIAGNOSIS — R1900 Intra-abdominal and pelvic swelling, mass and lump, unspecified site: Secondary | ICD-10-CM

## 2021-12-23 LAB — CBC WITH DIFFERENTIAL/PLATELET
Abs Immature Granulocytes: 0.07 10*3/uL (ref 0.00–0.07)
Basophils Absolute: 0.1 10*3/uL (ref 0.0–0.1)
Basophils Relative: 1 %
Eosinophils Absolute: 0.5 10*3/uL (ref 0.0–0.5)
Eosinophils Relative: 4 %
HCT: 42.9 % (ref 36.0–46.0)
Hemoglobin: 14.4 g/dL (ref 12.0–15.0)
Immature Granulocytes: 1 %
Lymphocytes Relative: 26 %
Lymphs Abs: 3.5 10*3/uL (ref 0.7–4.0)
MCH: 30.3 pg (ref 26.0–34.0)
MCHC: 33.6 g/dL (ref 30.0–36.0)
MCV: 90.1 fL (ref 80.0–100.0)
Monocytes Absolute: 0.8 10*3/uL (ref 0.1–1.0)
Monocytes Relative: 6 %
Neutro Abs: 8.4 10*3/uL — ABNORMAL HIGH (ref 1.7–7.7)
Neutrophils Relative %: 62 %
Platelets: 399 10*3/uL (ref 150–400)
RBC: 4.76 MIL/uL (ref 3.87–5.11)
RDW: 13.2 % (ref 11.5–15.5)
WBC: 13.3 10*3/uL — ABNORMAL HIGH (ref 4.0–10.5)
nRBC: 0 % (ref 0.0–0.2)

## 2021-12-23 NOTE — Patient Instructions (Signed)
It was good to see you today.  You are healing well from surgery.  Remember, no heavy lifting for at least 6 weeks after surgery and nothing in the vagina for 8.  We will plan on visits every 6 months.  I will see you back at the end of the year early January.  Please call back sometime in October or November to get that visit scheduled as my clinic schedule is not out past September at this time.  I will release your CEA blood test from today when it comes back in MyChart.  If you develop any new and concerning symptoms before your next visit with me, such as abdominal pain, unintentional weight loss, please call to see me sooner.

## 2021-12-23 NOTE — Progress Notes (Signed)
Gynecologic Oncology Return Clinic Visit  12/23/21  Reason for Visit: follow-up after recent surgery, treatment discussion  Treatment History: Oncology History  Ovarian cancer ([place])  11/23/2021 Pathology Results   FINAL MICROSCOPIC DIAGNOSIS:   A. FALLOPIAN TUBE  OVARY, RIGHT, SALPINGECTOMY:  Borderline mucinous tumor of an ovary.  There are no diagnostic features of invasive malignancy.  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   B. UTERUS, CERVIX, LEFT TUBE AND OVARY:  (Weight: 56 grams.)  Leiomyomata of the uterus.  Negative for malignancy in the myomas.  Benign endometrial polyp.  Atrophic endometrium.  Negative for endometrial hyperplasia and malignancy.  Uterine cervix with a few Nabothian cysts.  Negative for SIL and glandular neoplasia in the uterine cervix.  Accompanying left ovary with a calcified corpus albicans.  Normal accompanying fallopian tube.   C. PARACOLIC GUTTER, RIGHT, BIOPSY:  Benign adipose tissue with overlying normal peritoneum.  Negative for neoplasm.   D. PARACOLIC GUTTER, LEFT, BIOPSY:  Normal peritoneum and adjacent normal adipose tissue.  Negative for neoplasm.   E. PELVIC SIDEWALL, LEFT, BIOPSY:  Normal peritoneum and adjacent normal adipose tissue.  Negative for neoplasm.   F. PELVIC SIDEWALL, RIGHT, BIOPSY:  Normal peritoneum and adjacent normal adipose tissue.  Negative for neoplasm.    I.  CUL DE SAC, ANTERIOR, BIOPSY:  Normal peritoneum and adjacent normal adipose tissue.  Negative for neoplasm.   J. OMENTUM:  A portion of omentum with moderate vascular ectasia.  Negative for neoplasm.     12/02/2021 Initial Diagnosis   Ovarian cancer ([place])   12/02/2021 Cancer Staging   Staging form: Ovary, Fallopian Tube, and Primary Peritoneal Carcinoma, AJCC 8th Edition - Pathologic stage from 12/02/2021: FIGO Stage I (pT1, pN0, cM0) - Signed by Heath Lark, MD on 12/02/2021 Stage prefix: Initial diagnosis    11/23/21:  Robotic TLH/BSO, staging in the setting of adnexal mass, frozen section c/w at least borderline tumor  Interval History: Doing well.  Has some soreness at several of her incisions.  Denies any vaginal bleeding or discharge.  Reports regular bowel and bladder function.  Is trying to be careful about lifting, but her children have gone back to work and she has had to do some lifting.  Past Medical/Surgical History: Past Medical History:  Diagnosis Date   Asthma    No PFT performed   Blood transfusion without reported diagnosis    has to donate blood due to having "thick" blood.   COPD (chronic obstructive pulmonary disease) (HCC)    Depression    Diabetes mellitus 2002   GERD (gastroesophageal reflux disease)    Helicobacter pylori (H. pylori) infection    s/p triple therapy   Hepatic hemangioma    History of kidney stones    Hyperlipidemia    Hypertension    Hypertriglyceridemia    Panic attacks    mostly Agaraphobia   Pneumonia    Ventral hernia     Past Surgical History:  Procedure Laterality Date   CESAREAN SECTION     with 3 children   COLONOSCOPY  2018   OVARIAN CYST REMOVAL     Age 62   ROBOTIC ASSISTED BILATERAL SALPINGO OOPHERECTOMY Bilateral 11/23/2021   Procedure: XI ROBOTIC ASSISTED BILATERAL SALPINGO OOPHORECTOMY,  MINI LAPAROTOMY;  Surgeon: [name], MD;  Location: [facility];  Service: Gynecology;  Laterality: Bilateral;   ROBOTIC ASSISTED TOTAL HYSTERECTOMY N/A 11/23/2021   Procedure: XI ROBOTIC ASSISTED TOTAL HYSTERECTOMY, STAGING, CYSTOSCOPY;  Surgeon: [name], MD;  Location: [facility];  Service: Gynecology;  Laterality: N/A;   TUBAL LIGATION     UPPER GASTROINTESTINAL ENDOSCOPY  2018    Family History  Problem Relation Age of Onset   Colon polyps Mother        benign   Ovarian cancer Mother 51   Breast cancer Maternal Aunt        dx late 75s   Diabetes Maternal Uncle    Colon cancer Neg Hx    Stomach cancer Neg Hx    Endometrial  cancer Neg Hx    Prostate cancer Neg Hx    Pancreatic cancer Neg Hx    Esophageal cancer Neg Hx    Rectal cancer Neg Hx     Social History   Socioeconomic History   Marital status: Married    Spouse name: Not on file   Number of children: Not on file   Years of education: Not on file   Highest education level: Not on file  Occupational History   Not on file  Tobacco Use   Smoking status: Every Day    Packs/day: 1.00    Years: 43.00    Total pack years: 43.00    Types: Cigarettes   Smokeless tobacco: Never  Vaping Use   Vaping Use: Never used  Substance and Sexual Activity   Alcohol use: No    Alcohol/week: 0.0 standard drinks of alcohol    Comment: none   Drug use: No   Sexual activity: Not Currently  Other Topics Concern   Not on file  Social History Narrative   Drinks at least a pot of coffee every day.    Social Determinants of Health   Financial Resource Strain: Not on file  Food Insecurity: Not on file  Transportation Needs: Not on file  Physical Activity: Not on file  Stress: Not on file  Social Connections: Not on file    Current Medications:  Current Outpatient Medications:    acetaminophen (TYLENOL) 500 MG tablet, Take 1,000 mg by mouth every 6 (six) hours as needed for mild pain., Disp: , Rfl:    ADVAIR DISKUS 250-50 MCG/ACT AEPB, INHALE 1 INHALATION BY  MOUTH INTO THE LUNGS IN THE MORNING AND AT BEDTIME, Disp: 180 each, Rfl: 3   albuterol (VENTOLIN HFA) 108 (90 Base) MCG/ACT inhaler, INHALE 1 TO 2 PUFFS BY MOUTH EVERY 6 HOURS AS NEEDED FOR WHEEZING OR SHORTNESS OF BREATH, Disp: 9 g, Rfl: 0   ALPRAZolam (XANAX) 0.5 MG tablet, TAKE 1 TABLET BY MOUTH AT BEDTIME AS NEEDED FOR ANXIETY (Patient taking differently: Take 0.5 mg by mouth at bedtime.), Disp: 30 tablet, Rfl: 0   atorvastatin (LIPITOR) 40 MG tablet, Take 40 mg by mouth daily., Disp: , Rfl:    cetirizine (ZYRTEC) 10 MG tablet, Take 1 tablet (10 mg total) by mouth daily., Disp: 30 tablet, Rfl: 2    cimetidine (TAGAMET) 200 MG tablet, Take 200 mg by mouth 2 (two) times daily., Disp: , Rfl:    citalopram (CELEXA) 40 MG tablet, Take 1 tablet (40 mg total) by mouth daily., Disp: 90 tablet, Rfl: 0   dicyclomine (BENTYL) 20 MG tablet, Take 1 tablet (20 mg total) by mouth 4 (four) times daily -  before meals and at bedtime., Disp: 90 tablet, Rfl: 0   ezetimibe (ZETIA) 10 MG tablet, Take 1 tablet (10 mg total) by mouth daily., Disp: 90 tablet, Rfl: 2   glucose blood test strip, Use as instructed, Disp: 100 each, Rfl: 12  hydrOXYzine (VISTARIL) 100 MG capsule, Take 100 mg by mouth in the morning, at noon, and at bedtime., Disp: , Rfl:    tiotropium (SPIRIVA HANDIHALER) 18 MCG inhalation capsule, PLACE ONE CAPSULE INTO INHALER AND INHALE DAILY, Disp: 30 capsule, Rfl: 0   lisinopril (ZESTRIL) 10 MG tablet, Take 1 tablet (10 mg total) by mouth daily., Disp: 30 tablet, Rfl: 0  Review of Systems: + Fatigue, anxiety, depression. Denies appetite changes, fevers, chills, unexplained weight changes. Denies hearing loss, neck lumps or masses, mouth sores, ringing in ears or voice changes. Denies cough or wheezing.  Denies shortness of breath. Denies chest pain or palpitations. Denies leg swelling. Denies abdominal distention, pain, blood in stools, constipation, diarrhea, nausea, vomiting, or early satiety. Denies pain with intercourse, dysuria, frequency, hematuria or incontinence. Denies hot flashes, pelvic pain, vaginal bleeding or vaginal discharge.   Denies joint pain, back pain or muscle pain/cramps. Denies itching, rash, or wounds. Denies dizziness, headaches, numbness or seizures. Denies swollen lymph nodes or glands, denies easy bruising or bleeding. Denies confusion, or decreased concentration.  Physical Exam: BP (!) 134/56 (BP Location: Left Arm, Patient Position: Sitting)   Pulse 99   Resp 16   Ht '5\' 2"'$  (1.575 m)   Wt 139 lb 12.8 oz (63.4 kg)   LMP 12/24/2013   SpO2 91%   BMI 25.57  kg/m  General: Alert, oriented, no acute distress. HEENT: Normocephalic, atraumatic, sclera anicteric. Chest: Unlabored breathing on room air. Abdomen: soft, nontender.  Normoactive bowel sounds.  No masses or hepatosplenomegaly appreciated.  Well-healed incisions, remaining Dermabond removed.  Some laxity along the anterior abdominal wall. Extremities: Grossly normal range of motion.  Warm, well perfused.  No edema bilaterally. Skin: No rashes or lesions noted. GU: Normal appearing external genitalia without erythema, excoriation, or lesions.  Speculum exam reveals cuff intact, suture visible.  No bleeding or discharge.  Bimanual exam reveals cuff intact, no fluctuance or significant tenderness with palpation.    Laboratory & Radiologic Studies: CBC    Component Value Date/Time   WBC 13.3 (H) 12/23/2021 1447   RBC 4.76 12/23/2021 1447   HGB 14.4 12/23/2021 1447   HGB 17.0 (H) 09/21/2021 1543   HCT 42.9 12/23/2021 1447   HCT 49.4 (H) 09/21/2021 1543   PLT 399 12/23/2021 1447   PLT 464 (H) 09/21/2021 1543   MCV 90.1 12/23/2021 1447   MCV 88 09/21/2021 1543   MCH 30.3 12/23/2021 1447   MCHC 33.6 12/23/2021 1447   RDW 13.2 12/23/2021 1447   RDW 12.1 09/21/2021 1543   LYMPHSABS 3.5 12/23/2021 1447   LYMPHSABS 3.1 09/21/2021 1543   MONOABS 0.8 12/23/2021 1447   EOSABS 0.5 12/23/2021 1447   EOSABS 0.4 09/21/2021 1543   BASOSABS 0.1 12/23/2021 1447   BASOSABS 0.1 09/21/2021 1543     Assessment & Plan: Vanessa [name] is a 61 y.o. 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  The patient is doing well and meeting postoperative milestones.  Discussed again the importance of no heavy lifting to prevent hernia at any of her incisions until out the vaginal incision to heal.   Discussed final pathology again.  Patient was given a copy of her pathology report.  CEA was repeated today as I suspect this was elevated secondary to her borderline mucinous  tumor.  Discussed that there are not established guidelines for surveillance in the setting of borderline tumor.  Given completion surgery performed, I recommend that we  proceed with visits every 6 months to include review of symptoms and exam.  We will also get a CEA as this was a tumor marker for her.  We reviewed signs and symptoms that should prompt a phone call before neck scheduled visit.  CBC repeated today.  Hemoglobin and hematocrit normal.  28 minutes of total time was spent for this patient encounter, including preparation, face-to-face counseling with the patient and coordination of care, and documentation of the encounter.  Jeral Pinch, MD  Division of Gynecologic Oncology  Department of Obstetrics and Gynecology  [facility]

## 2021-12-26 ENCOUNTER — Encounter: Payer: 59 | Admitting: Student

## 2021-12-26 ENCOUNTER — Telehealth: Payer: Self-pay | Admitting: Gynecologic Oncology

## 2021-12-26 LAB — CEA (IN HOUSE-CHCC): CEA (CHCC-In House): 6.44 ng/mL — ABNORMAL HIGH (ref 0.00–5.00)

## 2021-12-26 NOTE — Telephone Encounter (Signed)
Called the patient with CEA test from last week.  Tumor marker has fallen significantly since surgery but is still elevated above the upper limits of normal.  Recommend getting her into see GI, which she had been somewhat resistant to do before surgery.  Patient amenable now.  I will ask my office to help arrange follow-up.  Jeral Pinch MD Gynecologic Oncology

## 2021-12-28 ENCOUNTER — Telehealth: Payer: Self-pay

## 2021-12-28 NOTE — Telephone Encounter (Signed)
Per Dr.Tucker, pt needs to follow up with [facility] regarding her CEA being elevated.   Pt is scheduled with Alonza Bogus, PA on August 3 @ 9:00am. Pt is aware.

## 2022-01-02 ENCOUNTER — Ambulatory Visit: Payer: Self-pay | Admitting: Genetic Counselor

## 2022-01-02 ENCOUNTER — Ambulatory Visit: Payer: 59 | Admitting: Gynecologic Oncology

## 2022-01-02 ENCOUNTER — Telehealth: Payer: Self-pay | Admitting: Genetic Counselor

## 2022-01-02 ENCOUNTER — Encounter: Payer: 59 | Admitting: Student

## 2022-01-02 ENCOUNTER — Encounter: Payer: Self-pay | Admitting: Genetic Counselor

## 2022-01-02 DIAGNOSIS — Z1379 Encounter for other screening for genetic and chromosomal anomalies: Secondary | ICD-10-CM | POA: Insufficient documentation

## 2022-01-02 NOTE — Progress Notes (Signed)
HPI:   Ms. Delgrande was previously seen in the [place] clinic due to a family history of breast and ovarian cancer and concerns regarding a hereditary predisposition to cancer. Please refer to our prior cancer genetics clinic note for more information regarding our discussion, assessment and recommendations, at the time. Ms. Saintil recent genetic test results were disclosed to her, as were recommendations warranted by these results. These results and recommendations are discussed in more detail below.  CANCER HISTORY:  Oncology History  Ovarian cancer ([place])  11/23/2021 Pathology Results   FINAL MICROSCOPIC DIAGNOSIS:   A. FALLOPIAN TUBE  OVARY, RIGHT, SALPINGECTOMY:  Borderline mucinous tumor of an ovary.  There are no diagnostic features of invasive malignancy.  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   B. UTERUS, CERVIX, LEFT TUBE AND OVARY:  (Weight: 56 grams.)  Leiomyomata of the uterus.  Negative for malignancy in the myomas.  Benign endometrial polyp.  Atrophic endometrium.  Negative for endometrial hyperplasia and malignancy.  Uterine cervix with a few Nabothian cysts.  Negative for SIL and glandular neoplasia in the uterine cervix.  Accompanying left ovary with a calcified corpus albicans.  Normal accompanying fallopian tube.   C. PARACOLIC GUTTER, RIGHT, BIOPSY:  Benign adipose tissue with overlying normal peritoneum.  Negative for neoplasm.   D. PARACOLIC GUTTER, LEFT, BIOPSY:  Normal peritoneum and adjacent normal adipose tissue.  Negative for neoplasm.   E. PELVIC SIDEWALL, LEFT, BIOPSY:  Normal peritoneum and adjacent normal adipose tissue.  Negative for neoplasm.   F. PELVIC SIDEWALL, RIGHT, BIOPSY:  Normal peritoneum and adjacent normal adipose tissue.  Negative for neoplasm.    I.  CUL DE SAC, ANTERIOR, BIOPSY:  Normal peritoneum and adjacent normal adipose tissue.  Negative for neoplasm.   J. OMENTUM:  A portion of  omentum with moderate vascular ectasia.  Negative for neoplasm.     12/02/2021 Initial Diagnosis   Ovarian cancer ([place])   12/02/2021 Cancer Staging   Staging form: Ovary, Fallopian Tube, and Primary Peritoneal Carcinoma, AJCC 8th Edition - Pathologic stage from 12/02/2021: FIGO Stage I (pT1, pN0, cM0) - Signed by Heath Lark, MD on 12/02/2021 Stage prefix: Initial diagnosis     FAMILY HISTORY:  We obtained a detailed, 4-generation family history.  Significant diagnoses are listed below:      Family History  Problem Relation Age of Onset   Colon polyps Mother     Ovarian cancer Mother 57   Breast cancer Maternal Aunt          dx late 78s       Ms. Roppolo's mother and maternal aunt were unavailable for genetic testing at this point in time. Ms. Hauth is unaware of previous family history of genetic testing for hereditary cancer risks. There is no reported Ashkenazi Jewish ancestry. There is no known consanguinity.  GENETIC TEST RESULTS:  The [employer] CustomNext-Cancer +RNAinsight Panel found no pathogenic mutations.   The CustomNext-Cancer+RNAinsight panel offered by [facility] includes sequencing and rearrangement analysis for the following 47 genes:  APC, ATM, AXIN2, BARD1, BMPR1A, BRCA1, BRCA2, BRIP1, CDH1, CDK4, CDKN2A, CHEK2, DICER1, EPCAM, GREM1, HOXB13, MEN1, MLH1, MSH2, MSH3, MSH6, MUTYH, NBN, NF1, NF2, NTHL1, PALB2, PMS2, POLD1, POLE, PTEN, RAD51C, RAD51D, RECQL, RET, SDHA, SDHAF2, SDHB, SDHC, SDHD, SMAD4, SMARCA4, STK11, TP53, TSC1, TSC2, and VHL.  RNA data is routinely analyzed for use in variant interpretation for all genes.   The test report has been scanned into EPIC and is located under the  Molecular Pathology section of the Results Review tab.  A portion of the result report is included below for reference. Genetic testing reported out on December 29, 2021.     Even though a pathogenic variant was not identified, possible explanations for the cancer in the family may  include: There may be no hereditary risk for cancer in the family. The cancers in Ms. Eilts;s family may be sporadic/familial or due to other genetic and environmental factors. There may be a gene mutation in one of these genes that current testing methods cannot detect but that chance is small. There could be another gene that has not yet been discovered, or that we have not yet tested, that is responsible for the cancer diagnoses in the family.  It is also possible there is a hereditary cause for the cancer in the family that Ms. Senseney did not inherit.   Therefore, it is important to remain in touch with cancer genetics in the future so that we can continue to offer Ms. Lynam the most up to date genetic testing.     ADDITIONAL GENETIC TESTING:  We discussed with Ms. Schwall that her genetic testing was fairly extensive.  If there are additional relevant genes identified to increase cancer risk that can be analyzed in the future, we would be happy to discuss and coordinate this testing at that time.      CANCER SCREENING RECOMMENDATIONS:  Ms. Liberman test result is considered negative (normal).  This means that we have not identified a hereditary cause for her family history of cancer at this time.   An individual's cancer risk and medical management are not determined by genetic test results alone. Overall cancer risk assessment incorporates additional factors, including personal medical history, family history, and any available genetic information that may result in a personalized plan for cancer prevention and surveillance. Therefore, it is recommended she continue to follow the cancer management and screening guidelines provided by her oncology and primary healthcare provider.   RECOMMENDATIONS FOR FAMILY MEMBERS:   Since she did not inherit a identifiable mutation in a cancer predisposition gene included on this panel, her children could not have inherited a known mutation from her in one  of these genes. Individuals in this family might be at some increased risk of developing cancer, over the general population risk, due to the family history of cancer.  Individuals in the family should notify their providers of the family history of cancer. We recommend women in this family have a yearly mammogram beginning at age 84, or 44 years younger than the earliest onset of cancer, an annual clinical breast exam, and perform monthly breast self-exams.   Other members of the family may still carry a pathogenic variant in one of these genes that Ms. Weng did not inherit. Based on the family history, we recommend her mother, who was diagnosed with ovarian cancer at age 48, have genetic counseling and testing. Ms. Bangura will let us know if we can be of any assistance in coordinating genetic counseling and/or testing for this family member.  Ms. Rhudy stated that her mother has other health issues and may not want genetic testing at this point in time.   FOLLOW-UP:  Lastly, we discussed with Ms. Ezzell that cancer genetics is a rapidly advancing field and it is possible that new genetic tests will be appropriate for her and/or her family members in the future. We encouraged her to remain in contact with cancer genetics on an annual  basis so we can update her personal and family histories and let her know of advances in cancer genetics that may benefit this family.   Our contact number was provided. Ms. Appenzeller questions were answered to her satisfaction, and she knows she is welcome to call us at anytime with additional questions or concerns.   Amberrose Friebel M. Joette Catching, Nina, [facility] Genetic Counselor [email or web address] (P) [phone]

## 2022-01-02 NOTE — Telephone Encounter (Signed)
Revealed negative genetic testing.  Discussed that we do not know why there is cancer in the family. It could be sporadic/famillial, due to a change in a gene that she did not inherit, due to a different gene that we are not testing, or maybe our current technology may not be able to pick something up.  It will be important for her to keep in contact with genetics to keep up with whether additional testing may be needed.  Recommended GT for her mother w/ a history of ovarian cancer.

## 2022-01-17 ENCOUNTER — Other Ambulatory Visit: Payer: Self-pay | Admitting: Student

## 2022-01-17 DIAGNOSIS — J449 Chronic obstructive pulmonary disease, unspecified: Secondary | ICD-10-CM

## 2022-01-26 ENCOUNTER — Ambulatory Visit: Payer: 59 | Admitting: Gastroenterology

## 2022-02-16 ENCOUNTER — Other Ambulatory Visit: Payer: Self-pay | Admitting: Student

## 2022-02-16 DIAGNOSIS — J449 Chronic obstructive pulmonary disease, unspecified: Secondary | ICD-10-CM

## 2022-02-20 ENCOUNTER — Other Ambulatory Visit: Payer: Self-pay | Admitting: *Deleted

## 2022-02-20 DIAGNOSIS — J449 Chronic obstructive pulmonary disease, unspecified: Secondary | ICD-10-CM

## 2022-02-20 MED ORDER — SPIRIVA HANDIHALER 18 MCG IN CAPS: ORAL_CAPSULE | RESPIRATORY_TRACT | 3 refills | Status: DC

## 2022-03-17 ENCOUNTER — Telehealth: Payer: Self-pay

## 2022-03-17 NOTE — Telephone Encounter (Signed)
Pt called with a concern of the past 2-3 months, periodically at the end of urinating, it sounds like "when a kid blows bubbles in their drink" she gets off the toilet and the urine in the toilet is "fizzy has bubbles in it". She has no pain,pressure or burning. No fever/chills. No odor/discharge. No vaginal bleeding/spotting.  She is wondering if having the bladder sewn during the surgery in May if there could be air in the bladder? She is confused as to why this is happening.   Message sent to Berline Lopes and Joylene John NP for advise.

## 2022-03-17 NOTE — Telephone Encounter (Signed)
Pt states blood sugars have been normal, no blood in urine and urine does have a gold color. She does drink water but not a lot. She also sees Dr. Collene Gobble, her last visit she had to cancel. She states she will call his office on Monday and get in to see him. He also treats her diabetes. Appointment offered to see Dr. Berline Lopes at the beginning of October pt declined stating : I will give it another week or so.   Advised her to call us back and update [place] once she sees Dr. Collene Gobble Pt voiced an understanding.

## 2022-03-21 ENCOUNTER — Telehealth: Payer: Self-pay

## 2022-03-21 NOTE — Telephone Encounter (Signed)
Pt called today to give an update from call last week. Pt states she got in to see Dr. Collene Gobble on 10/23, there was nothing sooner.She states she used to be on Metformin, but no longer. She did take one of her husbands Metformin.  She states she still has the bubbles in her urine but not as much. She has not been checking her sugars but has increased her water intake.   Pt aware I will notify the providers here in the office.

## 2022-03-22 ENCOUNTER — Encounter: Payer: Self-pay | Admitting: Hematology and Oncology

## 2022-03-22 ENCOUNTER — Telehealth: Payer: Self-pay

## 2022-03-22 NOTE — Telephone Encounter (Addendum)
She called and left a message requesting appt for labs and see Dr. Alvy Bimler. Last seen in the office 12/16/21.

## 2022-03-22 NOTE — Telephone Encounter (Signed)
Called patient's PCP requesting earlier evaluation for diabetes. Patient's appointment changed from 10/23 to 10/6 @ 8:45 AM.  Patient aware.

## 2022-03-23 ENCOUNTER — Telehealth: Payer: Self-pay | Admitting: Hematology and Oncology

## 2022-03-23 ENCOUNTER — Encounter: Payer: Self-pay | Admitting: Hematology and Oncology

## 2022-03-23 NOTE — Telephone Encounter (Signed)
Just be clear, the only reason I am seeing her is for phlebotomy I will send LOS to see her back in about 2-3 weeks,

## 2022-03-23 NOTE — Telephone Encounter (Signed)
Scheduled per 9/28 in basket, message has been left

## 2022-03-23 NOTE — Telephone Encounter (Signed)
Called and left below message. Appts already scheduled and left a message with scheduled appts. Ask her to call the office for questions.

## 2022-03-31 ENCOUNTER — Encounter: Payer: 59 | Admitting: Internal Medicine

## 2022-04-06 ENCOUNTER — Inpatient Hospital Stay: Payer: 59 | Admitting: Hematology and Oncology

## 2022-04-06 ENCOUNTER — Inpatient Hospital Stay: Payer: 59

## 2022-04-11 ENCOUNTER — Encounter: Payer: 59 | Admitting: Student

## 2022-04-11 ENCOUNTER — Encounter: Payer: Self-pay | Admitting: Student

## 2022-04-11 NOTE — Progress Notes (Deleted)
Last visit 09/21/2021 HTN-lisinopril 10 mg daily, BMP today COPD-Advair, albuterol, Spiriva, check on PFTs*** Ovarian cancer- FIGO Stage I (pT1, pN0, cM0).  Borderline mucinous tumor of the right ovary 11/23/2021 robotic total laparoscopic hysterectomy/bilateral salpingo-oophorectomy Tobacco cessation-patient consistently not interested Polycythemia-CBC on 12/23/2021 with hemoglobin of 14.4 HLD-atorvastatin 40 mg daily, ezetimibe 10 mg daily.  Lipid panel on 09/21/2021 with total cholesterol 267 HDL 32 and LDL 196.  Lipid panel today Anxiety and depression-Celexa 40 mg daily, hydroxyzine 100 mg 3 times daily as needed.  Xanax?   [facility] appointment

## 2022-04-17 ENCOUNTER — Encounter: Payer: 59 | Admitting: Student

## 2022-04-21 ENCOUNTER — Inpatient Hospital Stay: Payer: 59 | Attending: Student

## 2022-04-21 ENCOUNTER — Inpatient Hospital Stay: Payer: 59

## 2022-04-21 ENCOUNTER — Inpatient Hospital Stay: Payer: 59 | Admitting: Hematology and Oncology

## 2022-05-11 ENCOUNTER — Inpatient Hospital Stay: Payer: 59

## 2022-05-11 ENCOUNTER — Other Ambulatory Visit: Payer: Self-pay

## 2022-05-11 ENCOUNTER — Inpatient Hospital Stay: Payer: 59 | Attending: Student

## 2022-05-11 ENCOUNTER — Inpatient Hospital Stay: Payer: 59 | Admitting: Hematology and Oncology

## 2022-05-11 ENCOUNTER — Encounter: Payer: Self-pay | Admitting: Hematology and Oncology

## 2022-05-11 VITALS — BP 131/50 | HR 96 | Temp 98.7°F | Resp 18 | Ht 62.0 in | Wt 144.6 lb

## 2022-05-11 VITALS — BP 119/83 | HR 103 | Temp 98.0°F | Resp 18

## 2022-05-11 DIAGNOSIS — J449 Chronic obstructive pulmonary disease, unspecified: Secondary | ICD-10-CM | POA: Diagnosis not present

## 2022-05-11 DIAGNOSIS — J410 Simple chronic bronchitis: Secondary | ICD-10-CM

## 2022-05-11 DIAGNOSIS — F17200 Nicotine dependence, unspecified, uncomplicated: Secondary | ICD-10-CM | POA: Diagnosis not present

## 2022-05-11 DIAGNOSIS — Z7982 Long term (current) use of aspirin: Secondary | ICD-10-CM | POA: Insufficient documentation

## 2022-05-11 DIAGNOSIS — D751 Secondary polycythemia: Secondary | ICD-10-CM

## 2022-05-11 DIAGNOSIS — F17210 Nicotine dependence, cigarettes, uncomplicated: Secondary | ICD-10-CM | POA: Diagnosis not present

## 2022-05-11 LAB — CBC WITH DIFFERENTIAL/PLATELET
Abs Immature Granulocytes: 0.04 10*3/uL (ref 0.00–0.07)
Basophils Absolute: 0.1 10*3/uL (ref 0.0–0.1)
Basophils Relative: 1 %
Eosinophils Absolute: 0.4 10*3/uL (ref 0.0–0.5)
Eosinophils Relative: 3 %
HCT: 50.2 % — ABNORMAL HIGH (ref 36.0–46.0)
Hemoglobin: 17.2 g/dL — ABNORMAL HIGH (ref 12.0–15.0)
Immature Granulocytes: 0 %
Lymphocytes Relative: 25 %
Lymphs Abs: 2.9 10*3/uL (ref 0.7–4.0)
MCH: 30.8 pg (ref 26.0–34.0)
MCHC: 34.3 g/dL (ref 30.0–36.0)
MCV: 90 fL (ref 80.0–100.0)
Monocytes Absolute: 0.7 10*3/uL (ref 0.1–1.0)
Monocytes Relative: 6 %
Neutro Abs: 7.2 10*3/uL (ref 1.7–7.7)
Neutrophils Relative %: 65 %
Platelets: 372 10*3/uL (ref 150–400)
RBC: 5.58 MIL/uL — ABNORMAL HIGH (ref 3.87–5.11)
RDW: 14.1 % (ref 11.5–15.5)
WBC: 11.3 10*3/uL — ABNORMAL HIGH (ref 4.0–10.5)
nRBC: 0 % (ref 0.0–0.2)

## 2022-05-11 NOTE — Assessment & Plan Note (Addendum)
Hemoglobin is trending up since her surgery due to ongoing smoking I explained to the patient the difference between secondary polycythemia versus primary polycythemia vera We will proceed with phlebotomy I recommend the patient to continue on aspirin I plan to see her again in 3 months for further follow-up

## 2022-05-11 NOTE — Progress Notes (Signed)
Patient hemoglobin is 17.2- meets the greater than 15 requirement for her phlebotmy. 18 g needle used in her Left FA- needle removed post tx. Saw Dr. Alvy Bimler today- treatment planned. VSS- BP 136/61 (BP Location: Right Arm, Patient Position: Sitting)   Pulse 92   Temp 98.4 F (36.9 C) (Oral)   Resp 18   LMP 12/24/2013   SpO2 (!) 89%  Phlebotomy started at 1345 and ended at 1356. 502gm removed. Patient provided food and fluids. Patient tolerated well. VS post treatment- BP 119/83 (BP Location: Right Arm, Patient Position: Sitting)   Pulse (!) 103   Temp 98 F (36.7 C) (Oral)   Resp 18   LMP 12/24/2013   SpO2 91%   Stayed for 15 minutes observation and declined to stay longer.

## 2022-05-11 NOTE — Progress Notes (Signed)
[place] OFFICE PROGRESS NOTE  Sanjuan Dame, MD  ASSESSMENT & PLAN:  Polycythemia, secondary Hemoglobin is trending up since her surgery due to ongoing smoking I explained to the patient the difference between secondary polycythemia versus primary polycythemia vera We will proceed with phlebotomy I recommend the patient to continue on aspirin I plan to see her again in 3 months for further follow-up   Tobacco dependence We discussed importance of nicotine cessation The patient appears motivated to quit on her own  No orders of the defined types were placed in this encounter.   The total time spent in the appointment was 20 minutes encounter with patients including review of chart and various tests results, discussions about plan of care and coordination of care plan   All questions were answered. The patient knows to call the clinic with any problems, questions or concerns. No barriers to learning was detected.    Heath Lark, MD 11/16/20232:46 PM  INTERVAL HISTORY: Vanessa [name] 61 y.o. 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 Her husband, [name] is available with her at the appointment today  She was found to have abnormal CBC from recent visit to her primary care doctor's office On review of her electronic records from 2007 until her most recent blood work from October 14, 2019, hemoglobin fluctuated from 14.7 to as high as 18.1  She denies intermittent headaches, frequent leg cramps and occasional chest pain.  She has shortness of breath and chronic fatigue secondary to COPD She has been diagnosed with COPD for at least 5 years but does not need oxygen She has been smoking since the age of 43, currently at 2 packs of cigarettes per day Her  husband used to smoke but quit many years ago when he was diagnosed with a heart attack According to her husband, the patient is reluctant to quit because this is her coping strategy  She never suffer from diagnosis of blood clot.  There is no prior diagnosis of obstructive sleep apnea.  She underwent extensive evaluation in April 2021 and was found to have secondary polycythemia.  JAK2 mutation/erythropoietin levels were normal She was started on phlebotomy. 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  ALLERGIES:  is allergic to jardiance [empagliflozin], latex, and avandia [rosiglitazone].  MEDICATIONS:  Current Outpatient Medications  Medication Sig Dispense Refill   acetaminophen (TYLENOL) 500 MG tablet Take 1,000 mg by mouth every 6 (six) hours as needed for mild pain.     ADVAIR DISKUS 250-50 MCG/ACT AEPB INHALE 1 INHALATION BY  MOUTH INTO THE LUNGS IN THE MORNING AND AT BEDTIME 180 each 3   albuterol (VENTOLIN HFA) 108 (90 Base) MCG/ACT inhaler INHALE 1 TO 2 PUFFS BY MOUTH EVERY 6 HOURS AS NEEDED FOR WHEEZING FOR SHORTNESS OF BREATH 18 g 3   ALPRAZolam (XANAX) 0.5 MG tablet TAKE 1 TABLET  BY MOUTH AT BEDTIME AS NEEDED FOR ANXIETY (Patient taking differently: Take 0.5 mg by mouth at bedtime.) 30 tablet 0   atorvastatin (LIPITOR) 40 MG tablet Take 40 mg by mouth daily.     cetirizine (ZYRTEC) 10 MG tablet Take 1 tablet (10 mg total) by mouth daily. 30 tablet 2   cimetidine (TAGAMET) 200 MG tablet Take 200 mg by mouth 2 (two) times daily.      citalopram (CELEXA) 40 MG tablet Take 1 tablet (40 mg total) by mouth daily. 90 tablet 0   dicyclomine (BENTYL) 20 MG tablet Take 1 tablet (20 mg total) by mouth 4 (four) times daily -  before meals and at bedtime. 90 tablet 0   ezetimibe (ZETIA) 10 MG tablet Take 1 tablet (10 mg total) by mouth daily. 90 tablet 2   glucose blood test strip Use as instructed 100 each 12   hydrOXYzine (VISTARIL) 100 MG capsule Take 100 mg by mouth in the morning, at noon, and at bedtime.     lisinopril (ZESTRIL) 10 MG tablet Take 1 tablet (10 mg total) by mouth daily. 30 tablet 0   tiotropium (SPIRIVA HANDIHALER) 18 MCG inhalation capsule PLACE ONE CAPSULE INTO INHALER AND INHALE DAILY 90 capsule 3   No current facility-administered medications for this visit.     REVIEW OF SYSTEMS:   Constitutional: Denies fevers, chills or night sweats Eyes: Denies blurriness of vision Ears, nose, mouth, throat, and face: Denies mucositis or sore throat Respiratory: Denies cough, dyspnea or wheezes Cardiovascular: Denies palpitation, chest discomfort or lower extremity swelling Gastrointestinal:  Denies nausea, heartburn or change in bowel habits Skin: Denies abnormal skin rashes Lymphatics: Denies new lymphadenopathy or easy bruising Neurological:Denies numbness, tingling or new weaknesses Behavioral/Psych: Mood is stable, no new changes  All other systems were reviewed with the patient and are negative.  PHYSICAL EXAMINATION: ECOG PERFORMANCE STATUS: 1 - Symptomatic but completely ambulatory  Vitals:   05/11/22 1246  BP: (!) 131/50  Pulse: 96  Resp: 18  Temp: 98.7 F (37.1 C)  SpO2: (!) 88%   Filed Weights   05/11/22 1246  Weight: 144 lb 9.6 oz (65.6 kg)    GENERAL:alert, no distress and comfortable.  She looks plethoric NEURO: alert & oriented x 3 with fluent speech, no focal motor/sensory deficits  LABORATORY DATA:  I have reviewed the data as listed     Component Value Date/Time   NA 136  11/24/2021 0446   NA 142 09/21/2021 1543   K 4.8 11/24/2021 0446   CL 103 11/24/2021 0446   CO2 26 11/24/2021 0446   GLUCOSE 270 (H) 11/24/2021 0446   BUN 12 11/24/2021 0446   BUN 8 09/21/2021 1543   CREATININE 0.64 11/24/2021 0446   CREATININE 0.56 11/04/2021 1019   CREATININE 0.45 (L) 12/03/2014 1542   CALCIUM 8.6 (L) 11/24/2021 0446   PROT 7.5 11/22/2021 0951   ALBUMIN 4.1 11/22/2021 0951   AST 17 11/22/2021 0951   ALT 15 11/22/2021 0951   ALKPHOS 89 11/22/2021 0951   BILITOT 0.6 11/22/2021 0951   GFRNONAA >60 11/24/2021 0446   GFRNONAA >60 11/04/2021 1019   GFRNONAA >89 12/03/2014 1542   GFRAA 125 03/11/2020 1644   GFRAA >89 12/03/2014 1542    No results found for: "SPEP", "UPEP"  Lab Results  Component Value Date   WBC 11.3 (H) 05/11/2022   NEUTROABS 7.2 05/11/2022   HGB 17.2 (H) 05/11/2022   HCT 50.2 (H) 05/11/2022   MCV 90.0 05/11/2022  PLT 372 05/11/2022      Chemistry      Component Value Date/Time   NA 136 11/24/2021 0446   NA 142 09/21/2021 1543   K 4.8 11/24/2021 0446   CL 103 11/24/2021 0446   CO2 26 11/24/2021 0446   BUN 12 11/24/2021 0446   BUN 8 09/21/2021 1543   CREATININE 0.64 11/24/2021 0446   CREATININE 0.56 11/04/2021 1019   CREATININE 0.45 (L) 12/03/2014 1542      Component Value Date/Time   CALCIUM 8.6 (L) 11/24/2021 0446   ALKPHOS 89 11/22/2021 0951   AST 17 11/22/2021 0951   ALT 15 11/22/2021 0951   BILITOT 0.6 11/22/2021 0951

## 2022-05-11 NOTE — Assessment & Plan Note (Signed)
We discussed importance of nicotine cessation The patient appears motivated to quit on her own

## 2022-05-22 ENCOUNTER — Other Ambulatory Visit: Payer: Self-pay | Admitting: Student

## 2022-05-22 DIAGNOSIS — J449 Chronic obstructive pulmonary disease, unspecified: Secondary | ICD-10-CM

## 2022-05-22 NOTE — Telephone Encounter (Signed)
Next appt scheduled 05/29/22 with Dr Nikki Dom.

## 2022-05-29 ENCOUNTER — Encounter: Payer: Self-pay | Admitting: Student

## 2022-05-29 ENCOUNTER — Ambulatory Visit: Payer: 59 | Admitting: Student

## 2022-05-29 VITALS — BP 143/60 | HR 106 | Temp 98.0°F | Wt 142.6 lb

## 2022-05-29 DIAGNOSIS — E119 Type 2 diabetes mellitus without complications: Secondary | ICD-10-CM | POA: Diagnosis not present

## 2022-05-29 DIAGNOSIS — J449 Chronic obstructive pulmonary disease, unspecified: Secondary | ICD-10-CM | POA: Diagnosis not present

## 2022-05-29 DIAGNOSIS — J309 Allergic rhinitis, unspecified: Secondary | ICD-10-CM

## 2022-05-29 DIAGNOSIS — F418 Other specified anxiety disorders: Secondary | ICD-10-CM | POA: Diagnosis not present

## 2022-05-29 DIAGNOSIS — F17210 Nicotine dependence, cigarettes, uncomplicated: Secondary | ICD-10-CM

## 2022-05-29 DIAGNOSIS — E785 Hyperlipidemia, unspecified: Secondary | ICD-10-CM

## 2022-05-29 DIAGNOSIS — F32A Depression, unspecified: Secondary | ICD-10-CM

## 2022-05-29 DIAGNOSIS — I739 Peripheral vascular disease, unspecified: Secondary | ICD-10-CM

## 2022-05-29 DIAGNOSIS — Z794 Long term (current) use of insulin: Secondary | ICD-10-CM | POA: Diagnosis not present

## 2022-05-29 DIAGNOSIS — Z23 Encounter for immunization: Secondary | ICD-10-CM | POA: Diagnosis not present

## 2022-05-29 DIAGNOSIS — I10 Essential (primary) hypertension: Secondary | ICD-10-CM

## 2022-05-29 DIAGNOSIS — E1151 Type 2 diabetes mellitus with diabetic peripheral angiopathy without gangrene: Secondary | ICD-10-CM

## 2022-05-29 DIAGNOSIS — K219 Gastro-esophageal reflux disease without esophagitis: Secondary | ICD-10-CM

## 2022-05-29 LAB — POCT GLYCOSYLATED HEMOGLOBIN (HGB A1C): Hemoglobin A1C: 10.4 % — AB (ref 4.0–5.6)

## 2022-05-29 LAB — GLUCOSE, CAPILLARY: Glucose-Capillary: 264 mg/dL — ABNORMAL HIGH (ref 70–99)

## 2022-05-29 MED ORDER — CITALOPRAM HYDROBROMIDE 40 MG PO TABS: 40.0000 mg | ORAL_TABLET | Freq: Every day | ORAL | 3 refills | Status: DC

## 2022-05-29 MED ORDER — HYDROXYZINE PAMOATE 100 MG PO CAPS: 100.0000 mg | ORAL_CAPSULE | Freq: Three times a day (TID) | ORAL | 3 refills | Status: DC

## 2022-05-29 MED ORDER — LISINOPRIL 10 MG PO TABS: 10.0000 mg | ORAL_TABLET | Freq: Every day | ORAL | 3 refills | Status: DC

## 2022-05-29 MED ORDER — ATORVASTATIN CALCIUM 40 MG PO TABS: 40.0000 mg | ORAL_TABLET | Freq: Every day | ORAL | 3 refills | Status: DC

## 2022-05-29 MED ORDER — CIMETIDINE 200 MG PO TABS: 200.0000 mg | ORAL_TABLET | Freq: Two times a day (BID) | ORAL | 3 refills | Status: DC

## 2022-05-29 MED ORDER — SOLIQUA 100-33 UNT-MCG/ML ~~LOC~~ SOPN: 20.0000 [IU] | PEN_INJECTOR | Freq: Every day | SUBCUTANEOUS | 11 refills | Status: DC

## 2022-05-29 MED ORDER — CETIRIZINE HCL 10 MG PO TABS: 10.0000 mg | ORAL_TABLET | Freq: Every day | ORAL | 3 refills | Status: DC

## 2022-05-29 NOTE — Progress Notes (Unsigned)
   CC: Urinary frequency  HPI:  Vanessa [name] is a 61 y.o. female with PMH as below who presents to the clinic for follow-up and has complaints of polyuria and polydipsia.  Please see encounters tab for problem-based charting.  Past Medical History:  Diagnosis Date   Asthma    No PFT performed   Blood transfusion without reported diagnosis    has to donate blood due to having "thick" blood.   COPD (chronic obstructive pulmonary disease) (HCC)    Depression    Diabetes mellitus 2002   GERD (gastroesophageal reflux disease)    Helicobacter pylori (H. pylori) infection    s/p triple therapy   Hepatic hemangioma    History of kidney stones    Hyperlipidemia    Hypertension    Hypertriglyceridemia    Panic attacks    mostly Agaraphobia   Pneumonia    Ventral hernia    Review of Systems:   A comprehensive review of systems was negative except for: Weight gain, polyuria, polydipsia, calf pain with walking    Physical Exam:  Vitals:   05/29/22 1502  BP: (!) 143/60  Pulse: (!) 106  Temp: 98 F (36.7 C)  TempSrc: Oral  SpO2: (!) 89%  Weight: 142 lb 9.6 oz (64.7 kg)    Constitutional: Obese middle-aged female. In no acute distress. HENT: Normocephalic, atraumatic,  Eyes: Sclera non-icteric, EOM intact Cardio:Regular rate and rhythm. 2+ bilateral radial pulses.  Patient refused evaluation of pedal pulses. Pulm: Diffuse expiratory wheezes. Normal work of breathing on room air. Abdomen: Soft, non-tender, non-distended, positive bowel sounds. VOJ:[id number] Lower extremity exam Neuro:Alert and oriented x3. No focal deficit noted. Psych:Pleasant mood and affect.   Assessment & Plan:   No problem-specific Assessment & Plan notes found for this encounter.    Patient seen with Dr. Maury Dus, [facility] Resident PGY-1 Pager: [phone]

## 2022-05-29 NOTE — Patient Instructions (Signed)
  Thank you, Ms.[name], for allowing [place] to provide your care today. Today we discussed . . .  > Diabetes       - We will restart you on Soliqua 20 units daily for the increase in your blood sugar. You have done very well on this medication in the past.  Please check your blood sugar at home and change the dose of the medication by 2 units every week to get to a morning blood sugar below 120.   I have ordered the following labs for you:   Lab Orders         Glucose, capillary         POC Hbg A1C       Tests ordered today:  None   Referrals ordered today:   Referral Orders  No referral(s) requested today      I have ordered the following medication/changed the following medications:   Stop the following medications: Medications Discontinued During This Encounter  Medication Reason   dicyclomine (BENTYL) 20 MG tablet Patient has not taken in last 30 days   citalopram (CELEXA) 40 MG tablet Reorder   atorvastatin (LIPITOR) 40 MG tablet Reorder   cimetidine (TAGAMET) 200 MG tablet Reorder   cetirizine (ZYRTEC) 10 MG tablet Reorder   lisinopril (ZESTRIL) 10 MG tablet Reorder   hydrOXYzine (VISTARIL) 100 MG capsule Reorder     Start the following medications: Meds ordered this encounter  Medications   citalopram (CELEXA) 40 MG tablet    Sig: Take 1 tablet (40 mg total) by mouth daily.    Dispense:  90 tablet    Refill:  3   hydrOXYzine (VISTARIL) 100 MG capsule    Sig: Take 1 capsule (100 mg total) by mouth in the morning, at noon, and at bedtime.    Dispense:  30 capsule    Refill:  3   atorvastatin (LIPITOR) 40 MG tablet    Sig: Take 1 tablet (40 mg total) by mouth daily.    Dispense:  90 tablet    Refill:  3   cetirizine (ZYRTEC) 10 MG tablet    Sig: Take 1 tablet (10 mg total) by mouth daily.    Dispense:  90 tablet    Refill:  3   cimetidine (TAGAMET) 200 MG tablet    Sig: Take 1 tablet (200 mg total) by mouth 2 (two) times daily.    Dispense:  180 tablet     Refill:  3   lisinopril (ZESTRIL) 10 MG tablet    Sig: Take 1 tablet (10 mg total) by mouth daily.    Dispense:  90 tablet    Refill:  3   Insulin Glargine-Lixisenatide (SOLIQUA) 100-33 UNT-MCG/ML SOPN    Sig: Inject 20 Units into the skin daily.    Dispense:  3 mL    Refill:  11      Follow up: 3 months    Remember:     Should you have any questions or concerns please call the internal medicine clinic at [phone].     Johny Blamer, [place]

## 2022-05-30 ENCOUNTER — Telehealth: Payer: Self-pay

## 2022-05-30 LAB — MICROALBUMIN / CREATININE URINE RATIO
Creatinine, Urine: 23 mg/dL
Microalb/Creat Ratio: 1310 mg/g creat — ABNORMAL HIGH (ref 0–29)
Microalbumin, Urine: 301.2 ug/mL

## 2022-05-30 NOTE — Telephone Encounter (Signed)
Pt is returning a phone call to the doctor. Please call back.

## 2022-05-31 DIAGNOSIS — Z9889 Other specified postprocedural states: Secondary | ICD-10-CM | POA: Insufficient documentation

## 2022-05-31 DIAGNOSIS — I739 Peripheral vascular disease, unspecified: Secondary | ICD-10-CM | POA: Insufficient documentation

## 2022-05-31 NOTE — Progress Notes (Signed)
Called and spoke to the patient to inform her about her urine microalbumin creatinine ratio that has increased to 1300.  This was slightly expected with her increase in A1c off of insulin to 10.4.  We will recheck this again after 3 months back on Soliqua.  All questions were answered.

## 2022-05-31 NOTE — Assessment & Plan Note (Signed)
Blood pressure today 143/60.  No reported issues on lisinopril. - Continue lisinopril 10 mg daily

## 2022-05-31 NOTE — Assessment & Plan Note (Addendum)
A1c today is up to 10.4 from 7.8.  She had previously been off of insulin prior to her last A1c and so was continued off of it afterwards.  Unfortunately her diet was not stable and she has recently gained weight back.  She has been having polyuria and polydipsia.  She is urinating 2-3 times per hour.  She is willing to restart Soliqua so we will do that.  She will titrate it every for 5 days x 2 units based on her morning sugars. - Start Soliqua 20 units nightly -Urine microalbumin creatinine ratio today -Return in 3 months for repeat A1c

## 2022-05-31 NOTE — Assessment & Plan Note (Signed)
Patient with overall stable COPD.  She continues on Advair, Spiriva, and and albuterol inhalers.  Her oxygen saturation was again low today at 89% on room air which is not unusual for her.  She denies any changes in her baseline dyspnea.  She continues to smoke cigarettes and is not interested in quitting at this time.  On exam she does have bilateral wheezing. - Continue on an Advair, Spiriva, and albuterol

## 2022-05-31 NOTE — Assessment & Plan Note (Signed)
Patient describes claudication symptoms with bilateral calf pain that is most prominent when walking.  She is not able to walk from the parking lot to the building without having pain.  We discussed her risk factors for peripheral artery disease including smoking which she is not interested in cessation at this time.  We discussed the risk of critical limb ischemia and amputation.  She states her understanding.  She refuses lower extremity physical exam so I was unable to evaluate for diminished pulses or skin changes on her lower extremities.  From what I could see the lower extremities did not appear edematous.  She does not have claudication symptoms at rest at this time.  We discussed the need for further evaluation which she states she does not want to do at this time. - We will continue to monitor and encourage risk factor modification

## 2022-06-05 NOTE — Progress Notes (Signed)
Internal Medicine Clinic Attending  I saw and evaluated the patient.  I personally confirmed the key portions of the history and exam documented by the resident  and I reviewed pertinent patient test results.  The assessment, diagnosis, and plan were formulated together and I agree with the documentation in the resident's note.  

## 2022-06-30 ENCOUNTER — Other Ambulatory Visit (HOSPITAL_COMMUNITY): Payer: Self-pay

## 2022-07-13 ENCOUNTER — Telehealth: Payer: Self-pay | Admitting: Hematology and Oncology

## 2022-07-13 ENCOUNTER — Telehealth: Payer: Self-pay

## 2022-07-13 NOTE — Telephone Encounter (Signed)
Per 1/18 IB, message left

## 2022-07-13 NOTE — Telephone Encounter (Signed)
She had difficulties keeping her appt in the past I can see her on 1/29 if that works for her let me know and I will put in LOS

## 2022-07-13 NOTE — Telephone Encounter (Signed)
Called and ask if 1/29 would work for appt. She would like the 1/29 appt.

## 2022-07-13 NOTE — Telephone Encounter (Signed)
Returned her call. Her next appt is 2/16. She is requesting a earlier appt for lab and phlebotomy. She is feeling very tired and has no energy.

## 2022-07-13 NOTE — Telephone Encounter (Signed)
LOS sent 

## 2022-07-24 ENCOUNTER — Inpatient Hospital Stay: Payer: 59

## 2022-07-24 ENCOUNTER — Telehealth: Payer: Self-pay

## 2022-07-24 ENCOUNTER — Inpatient Hospital Stay: Payer: 59 | Admitting: Hematology and Oncology

## 2022-07-24 NOTE — Telephone Encounter (Signed)
Returned her call. She is canceling today's appts due to having covid. She had a positive covid test on 1/25. Appts canceled.

## 2022-08-05 ENCOUNTER — Inpatient Hospital Stay (HOSPITAL_BASED_OUTPATIENT_CLINIC_OR_DEPARTMENT_OTHER)
Admission: EM | Admit: 2022-08-05 | Discharge: 2022-08-09 | DRG: 193 | Disposition: A | Discharge status: Home / Self Care | Payer: 59 | Attending: Internal Medicine | Admitting: Internal Medicine

## 2022-08-05 ENCOUNTER — Encounter (HOSPITAL_BASED_OUTPATIENT_CLINIC_OR_DEPARTMENT_OTHER): Payer: Self-pay

## 2022-08-05 ENCOUNTER — Other Ambulatory Visit: Payer: Self-pay

## 2022-08-05 ENCOUNTER — Telehealth: Payer: 59 | Admitting: Nurse Practitioner

## 2022-08-05 ENCOUNTER — Emergency Department (HOSPITAL_BASED_OUTPATIENT_CLINIC_OR_DEPARTMENT_OTHER): Payer: 59

## 2022-08-05 DIAGNOSIS — J189 Pneumonia, unspecified organism: Principal | ICD-10-CM

## 2022-08-05 DIAGNOSIS — D751 Secondary polycythemia: Secondary | ICD-10-CM | POA: Diagnosis present

## 2022-08-05 DIAGNOSIS — F419 Anxiety disorder, unspecified: Secondary | ICD-10-CM | POA: Diagnosis present

## 2022-08-05 DIAGNOSIS — I10 Essential (primary) hypertension: Secondary | ICD-10-CM | POA: Diagnosis present

## 2022-08-05 DIAGNOSIS — F17210 Nicotine dependence, cigarettes, uncomplicated: Secondary | ICD-10-CM | POA: Diagnosis present

## 2022-08-05 DIAGNOSIS — E119 Type 2 diabetes mellitus without complications: Secondary | ICD-10-CM | POA: Diagnosis present

## 2022-08-05 DIAGNOSIS — F32A Depression, unspecified: Secondary | ICD-10-CM

## 2022-08-05 DIAGNOSIS — Z7951 Long term (current) use of inhaled steroids: Secondary | ICD-10-CM

## 2022-08-05 DIAGNOSIS — J9601 Acute respiratory failure with hypoxia: Secondary | ICD-10-CM | POA: Diagnosis present

## 2022-08-05 DIAGNOSIS — E785 Hyperlipidemia, unspecified: Secondary | ICD-10-CM | POA: Diagnosis present

## 2022-08-05 DIAGNOSIS — T380X5A Adverse effect of glucocorticoids and synthetic analogues, initial encounter: Secondary | ICD-10-CM | POA: Diagnosis present

## 2022-08-05 DIAGNOSIS — Z8616 Personal history of COVID-19: Secondary | ICD-10-CM | POA: Diagnosis not present

## 2022-08-05 DIAGNOSIS — Z83719 Family history of colon polyps, unspecified: Secondary | ICD-10-CM

## 2022-08-05 DIAGNOSIS — K219 Gastro-esophageal reflux disease without esophagitis: Secondary | ICD-10-CM | POA: Diagnosis present

## 2022-08-05 DIAGNOSIS — R0602 Shortness of breath: Secondary | ICD-10-CM | POA: Diagnosis not present

## 2022-08-05 DIAGNOSIS — Z833 Family history of diabetes mellitus: Secondary | ICD-10-CM

## 2022-08-05 DIAGNOSIS — J449 Chronic obstructive pulmonary disease, unspecified: Secondary | ICD-10-CM | POA: Insufficient documentation

## 2022-08-05 DIAGNOSIS — E781 Pure hyperglyceridemia: Secondary | ICD-10-CM | POA: Diagnosis present

## 2022-08-05 DIAGNOSIS — J188 Other pneumonia, unspecified organism: Secondary | ICD-10-CM | POA: Diagnosis not present

## 2022-08-05 DIAGNOSIS — Z8041 Family history of malignant neoplasm of ovary: Secondary | ICD-10-CM

## 2022-08-05 DIAGNOSIS — Z91040 Latex allergy status: Secondary | ICD-10-CM

## 2022-08-05 DIAGNOSIS — R7981 Abnormal blood-gas level: Secondary | ICD-10-CM | POA: Diagnosis not present

## 2022-08-05 DIAGNOSIS — J441 Chronic obstructive pulmonary disease with (acute) exacerbation: Secondary | ICD-10-CM

## 2022-08-05 DIAGNOSIS — Z794 Long term (current) use of insulin: Secondary | ICD-10-CM | POA: Diagnosis not present

## 2022-08-05 DIAGNOSIS — Z888 Allergy status to other drugs, medicaments and biological substances status: Secondary | ICD-10-CM

## 2022-08-05 DIAGNOSIS — Z79899 Other long term (current) drug therapy: Secondary | ICD-10-CM

## 2022-08-05 DIAGNOSIS — J440 Chronic obstructive pulmonary disease with acute lower respiratory infection: Secondary | ICD-10-CM | POA: Diagnosis present

## 2022-08-05 DIAGNOSIS — Z803 Family history of malignant neoplasm of breast: Secondary | ICD-10-CM

## 2022-08-05 LAB — BASIC METABOLIC PANEL
Anion gap: 9 (ref 5–15)
BUN: 8 mg/dL (ref 8–23)
CO2: 28 mmol/L (ref 22–32)
Calcium: 9.7 mg/dL (ref 8.9–10.3)
Chloride: 99 mmol/L (ref 98–111)
Creatinine, Ser: 0.47 mg/dL (ref 0.44–1.00)
GFR, Estimated: >60 mL/min (ref >60)
Glucose, Bld: 280 mg/dL — ABNORMAL HIGH (ref 70–99)
Potassium: 4.6 mmol/L (ref 3.5–5.1)
Sodium: 136 mmol/L (ref 135–145)

## 2022-08-05 LAB — RESPIRATORY PANEL BY PCR

## 2022-08-05 LAB — CBC
HCT: 47.8 % — ABNORMAL HIGH (ref 36.0–46.0)
Hemoglobin: 15.1 g/dL — ABNORMAL HIGH (ref 12.0–15.0)
MCH: 27.6 pg (ref 26.0–34.0)
MCHC: 31.6 g/dL (ref 30.0–36.0)
MCV: 87.2 fL (ref 80.0–100.0)
Platelets: 657 10*3/uL — ABNORMAL HIGH (ref 150–400)
RBC: 5.48 MIL/uL — ABNORMAL HIGH (ref 3.87–5.11)
RDW: 13.7 % (ref 11.5–15.5)
WBC: 14 10*3/uL — ABNORMAL HIGH (ref 4.0–10.5)
nRBC: 0 % (ref 0.0–0.2)

## 2022-08-05 LAB — CBG MONITORING, ED: Glucose-Capillary: 263 mg/dL — ABNORMAL HIGH (ref 70–99)

## 2022-08-05 LAB — GLUCOSE, CAPILLARY: Glucose-Capillary: 443 mg/dL — ABNORMAL HIGH (ref 70–99)

## 2022-08-05 MED ORDER — IPRATROPIUM-ALBUTEROL 0.5-2.5 (3) MG/3ML IN SOLN: 3.0000 mL | Freq: Once | RESPIRATORY_TRACT | Status: DC

## 2022-08-05 MED ORDER — INSULIN ASPART 100 UNIT/ML IJ SOLN
5.0000 [IU] | Freq: Once | INTRAMUSCULAR | Status: AC
  Administered 2022-08-05: 5 [IU] via SUBCUTANEOUS

## 2022-08-05 MED ORDER — METHYLPREDNISOLONE SODIUM SUCC 40 MG IJ SOLR
40.0000 mg | Freq: Every day | INTRAMUSCULAR | Status: DC
  Administered 2022-08-06: 40 mg via INTRAVENOUS
  Filled 2022-08-05: qty 1

## 2022-08-05 MED ORDER — SODIUM CHLORIDE 0.9 % IV SOLN
500.0000 mg | Freq: Once | INTRAVENOUS | Status: AC
  Administered 2022-08-05: 500 mg via INTRAVENOUS
  Filled 2022-08-05: qty 5

## 2022-08-05 MED ORDER — TIOTROPIUM BROMIDE MONOHYDRATE 18 MCG IN CAPS: 18.0000 ug | ORAL_CAPSULE | Freq: Every day | RESPIRATORY_TRACT | Status: DC

## 2022-08-05 MED ORDER — IPRATROPIUM-ALBUTEROL 0.5-2.5 (3) MG/3ML IN SOLN
3.0000 mL | Freq: Once | RESPIRATORY_TRACT | Status: AC
  Administered 2022-08-05: 3 mL via RESPIRATORY_TRACT
  Filled 2022-08-05: qty 3

## 2022-08-05 MED ORDER — INSULIN ASPART 100 UNIT/ML IJ SOLN
0.0000 [IU] | Freq: Three times a day (TID) | INTRAMUSCULAR | Status: DC
  Administered 2022-08-06: 5 [IU] via SUBCUTANEOUS
  Administered 2022-08-06: 15 [IU] via SUBCUTANEOUS
  Administered 2022-08-06: 5 [IU] via SUBCUTANEOUS
  Administered 2022-08-07: 3 [IU] via SUBCUTANEOUS
  Administered 2022-08-07 (×2): 5 [IU] via SUBCUTANEOUS
  Administered 2022-08-07: 3 [IU] via SUBCUTANEOUS
  Administered 2022-08-08: 5 [IU] via SUBCUTANEOUS
  Administered 2022-08-08: 2 [IU] via SUBCUTANEOUS
  Administered 2022-08-08: 3 [IU] via SUBCUTANEOUS
  Administered 2022-08-09: 2 [IU] via SUBCUTANEOUS

## 2022-08-05 MED ORDER — UMECLIDINIUM BROMIDE 62.5 MCG/ACT IN AEPB
1.0000 | INHALATION_SPRAY | Freq: Every day | RESPIRATORY_TRACT | Status: DC
  Administered 2022-08-06 – 2022-08-09 (×4): 1 via RESPIRATORY_TRACT
  Filled 2022-08-05: qty 7

## 2022-08-05 MED ORDER — HYDROCOD POLI-CHLORPHE POLI ER 10-8 MG/5ML PO SUER
5.0000 mL | Freq: Once | ORAL | Status: AC
  Administered 2022-08-05: 5 mL via ORAL
  Filled 2022-08-05: qty 5

## 2022-08-05 MED ORDER — SODIUM CHLORIDE 0.9 % IV SOLN
1.0000 g | INTRAVENOUS | Status: DC
  Administered 2022-08-06: 1 g via INTRAVENOUS
  Filled 2022-08-05: qty 10

## 2022-08-05 MED ORDER — ATORVASTATIN CALCIUM 40 MG PO TABS
40.0000 mg | ORAL_TABLET | Freq: Every day | ORAL | Status: DC
  Administered 2022-08-06 – 2022-08-09 (×4): 40 mg via ORAL
  Filled 2022-08-05 (×4): qty 1

## 2022-08-05 MED ORDER — LISINOPRIL 10 MG PO TABS
10.0000 mg | ORAL_TABLET | Freq: Every day | ORAL | Status: DC
  Administered 2022-08-06 – 2022-08-09 (×4): 10 mg via ORAL
  Filled 2022-08-05 (×4): qty 1

## 2022-08-05 MED ORDER — SODIUM CHLORIDE 0.9 % IV SOLN
500.0000 mg | INTRAVENOUS | Status: DC
  Administered 2022-08-06: 500 mg via INTRAVENOUS
  Filled 2022-08-05: qty 5

## 2022-08-05 MED ORDER — IPRATROPIUM-ALBUTEROL 0.5-2.5 (3) MG/3ML IN SOLN
RESPIRATORY_TRACT | Status: AC
  Administered 2022-08-05: 3 mL
  Filled 2022-08-05: qty 3

## 2022-08-05 MED ORDER — FAMOTIDINE 20 MG PO TABS
20.0000 mg | ORAL_TABLET | Freq: Two times a day (BID) | ORAL | Status: DC
  Administered 2022-08-05 – 2022-08-09 (×8): 20 mg via ORAL
  Filled 2022-08-05 (×8): qty 1

## 2022-08-05 MED ORDER — CITALOPRAM HYDROBROMIDE 20 MG PO TABS
40.0000 mg | ORAL_TABLET | Freq: Every day | ORAL | Status: DC
  Administered 2022-08-06 – 2022-08-09 (×4): 40 mg via ORAL
  Filled 2022-08-05 (×4): qty 2

## 2022-08-05 MED ORDER — EZETIMIBE 10 MG PO TABS
10.0000 mg | ORAL_TABLET | Freq: Every day | ORAL | Status: DC
  Administered 2022-08-06 – 2022-08-09 (×4): 10 mg via ORAL
  Filled 2022-08-05 (×4): qty 1

## 2022-08-05 MED ORDER — ENOXAPARIN SODIUM 40 MG/0.4ML IJ SOSY
40.0000 mg | PREFILLED_SYRINGE | INTRAMUSCULAR | Status: DC
  Administered 2022-08-06 – 2022-08-09 (×4): 40 mg via SUBCUTANEOUS
  Filled 2022-08-05 (×4): qty 0.4

## 2022-08-05 MED ORDER — HYDROXYZINE HCL 50 MG PO TABS
100.0000 mg | ORAL_TABLET | Freq: Three times a day (TID) | ORAL | Status: DC
  Administered 2022-08-05 – 2022-08-07 (×5): 100 mg via ORAL
  Filled 2022-08-05 (×5): qty 2

## 2022-08-05 MED ORDER — LORATADINE 10 MG PO TABS
10.0000 mg | ORAL_TABLET | Freq: Every day | ORAL | Status: DC
  Administered 2022-08-06 – 2022-08-09 (×4): 10 mg via ORAL
  Filled 2022-08-05 (×4): qty 1

## 2022-08-05 MED ORDER — MAGNESIUM SULFATE 2 GM/50ML IV SOLN
2.0000 g | Freq: Once | INTRAVENOUS | Status: AC
  Administered 2022-08-05: 2 g via INTRAVENOUS
  Filled 2022-08-05: qty 50

## 2022-08-05 MED ORDER — MOMETASONE FURO-FORMOTEROL FUM 200-5 MCG/ACT IN AERO
2.0000 | INHALATION_SPRAY | Freq: Two times a day (BID) | RESPIRATORY_TRACT | Status: DC
  Administered 2022-08-06 – 2022-08-09 (×7): 2 via RESPIRATORY_TRACT
  Filled 2022-08-05: qty 8.8

## 2022-08-05 MED ORDER — METHYLPREDNISOLONE SODIUM SUCC 125 MG IJ SOLR
125.0000 mg | Freq: Every day | INTRAMUSCULAR | Status: DC
  Administered 2022-08-05: 125 mg via INTRAVENOUS
  Filled 2022-08-05: qty 2

## 2022-08-05 MED ORDER — IPRATROPIUM-ALBUTEROL 0.5-2.5 (3) MG/3ML IN SOLN: 3.0000 mL | Freq: Four times a day (QID) | RESPIRATORY_TRACT | Status: DC | PRN

## 2022-08-05 MED ORDER — SODIUM CHLORIDE 0.9 % IV SOLN
1.0000 g | Freq: Once | INTRAVENOUS | Status: AC
  Administered 2022-08-05: 1 g via INTRAVENOUS
  Filled 2022-08-05: qty 10

## 2022-08-05 NOTE — Assessment & Plan Note (Signed)
-  continue Lisinopril

## 2022-08-05 NOTE — Assessment & Plan Note (Signed)
>>  ASSESSMENT AND PLAN FOR HTN (HYPERTENSION) WRITTEN ON 08/05/2022  9:59 PM BY TU, CHING T, DO  -continue Lisinopril

## 2022-08-05 NOTE — Patient Instructions (Signed)
Vanessa [name], thank you for joining Chevis Pretty, FNP for today's virtual visit.  While this provider is not your primary care provider (PCP), if your PCP is located in our provider database this encounter information will be shared with them immediately following your visit.   [facility] account gives you access to today's visit and all your visits, tests, and labs performed at [facility] " click here if you don't have a [facility] account or go to [email or web address]  Consent: (Patient) Vanessa [name] provided verbal consent for this virtual visit at the beginning of the encounter.  Current Medications:  Current Outpatient Medications:    acetaminophen (TYLENOL) 500 MG tablet, Take 1,000 mg by mouth every 6 (six) hours as needed for mild pain., Disp: , Rfl:    ADVAIR DISKUS 250-50 MCG/ACT AEPB, INHALE 1 INHALATION BY  MOUTH INTO THE LUNGS IN THE MORNING AND AT BEDTIME, Disp: 180 each, Rfl: 3   albuterol (VENTOLIN HFA) 108 (90 Base) MCG/ACT inhaler, INHALE 1 TO 2 PUFFS INTO LUNGS EVERY 6 HOURS AS NEEDED FOR WHEEZING FOR SHORTNESS OF BREATH, Disp: 9 g, Rfl: 3   ALPRAZolam (XANAX) 0.5 MG tablet, TAKE 1 TABLET BY MOUTH AT BEDTIME AS NEEDED FOR ANXIETY (Patient taking differently: Take 0.5 mg by mouth at bedtime.), Disp: 30 tablet, Rfl: 0   atorvastatin (LIPITOR) 40 MG tablet, Take 1 tablet (40 mg total) by mouth daily., Disp: 90 tablet, Rfl: 3   cetirizine (ZYRTEC) 10 MG tablet, Take 1 tablet (10 mg total) by mouth daily., Disp: 90 tablet, Rfl: 3   cimetidine (TAGAMET) 200 MG tablet, Take 1 tablet (200 mg total) by mouth 2 (two) times daily., Disp: 180 tablet, Rfl: 3   citalopram (CELEXA) 40 MG tablet, Take 1 tablet (40 mg total) by mouth daily., Disp: 90 tablet, Rfl: 3   ezetimibe (ZETIA) 10 MG tablet, Take 1 tablet (10 mg total) by mouth daily., Disp: 90 tablet, Rfl: 2   glucose blood test strip, Use as instructed, Disp: 100 each, Rfl: 12   hydrOXYzine  (VISTARIL) 100 MG capsule, Take 1 capsule (100 mg total) by mouth in the morning, at noon, and at bedtime., Disp: 30 capsule, Rfl: 3   Insulin Glargine-Lixisenatide (SOLIQUA) 100-33 UNT-MCG/ML SOPN, Inject 20 Units into the skin daily., Disp: 3 mL, Rfl: 11   lisinopril (ZESTRIL) 10 MG tablet, Take 1 tablet (10 mg total) by mouth daily., Disp: 90 tablet, Rfl: 3   tiotropium (SPIRIVA HANDIHALER) 18 MCG inhalation capsule, PLACE ONE CAPSULE INTO INHALER AND INHALE DAILY, Disp: 90 capsule, Rfl: 3   Medications ordered in this encounter:  No orders of the defined types were placed in this encounter.    *If you need refills on other medications prior to your next appointment, please contact your pharmacy*  Follow-Up: Call back or seek an in-person evaluation if the symptoms worsen or if the condition fails to improve as anticipated.  [place] [phone]  Other Instructions To ED ASAP   If you have been instructed to have an in-person evaluation today at a local Urgent Care facility, please use the link below. It will take you to a list of all of our available [facility], including address, phone number and hours of operation. Please do not delay care.  [facility]  If you or a family member do not have a primary care provider, use the link below to schedule a visit and establish  care. When you choose a [facility] primary care physician or advanced practice provider, you gain a long-term partner in health. Find a Primary Care Provider  Learn more about [facility] in-office and virtual care options: [facility]

## 2022-08-05 NOTE — Assessment & Plan Note (Addendum)
-  continue statin and zetia

## 2022-08-05 NOTE — Progress Notes (Signed)
Plan of Care Note for accepted transfer   Patient: Vanessa [name] MRN: [id number]   [place]: 08/05/2022  Facility requesting transfer: [facility] Requesting Provider: Quentin Mulling, PA Reason for transfer: Acute hypoxic respiratory failure Facility course: 62 yo F with heavy tobacco use, T2DM, HTN, COPD, IBS, HTN, recent COVID on 1/21 who presents with increasing shortness of breath.   Has been out of inhaler for a week.   In ED, afebrile, 83% room air. No oxygen use at baseline. Initially required 3L but now on HFNC 5L.   Wheezing improved with duoneb.   CXR showed multifocal pneumonia. WBC 14.   Has received IV solu-medrol and started on IV Rocephin and Azithromycin. I have also asked PA to consider giving IV Mg.     Plan of care: The patient is accepted for admission to Progressive unit, at [facility] or [facility] depending on beds.  ED PA to continue care of pt while she remains in ED.   Author: Orene Desanctis, DO 08/05/2022  Check [email or web address] for on-call coverage.  Nursing staff, Please call [facility] number on Amion as soon as patient's arrival, so appropriate admitting provider can evaluate the pt.

## 2022-08-05 NOTE — H&P (Addendum)
History and Physical    Patient: Vanessa [name] [id number] DOB: 1961/02/03 DOA: 08/05/2022 DOS: the patient was seen and examined on 08/05/2022 PCP: Sanjuan Dame, MD  Patient coming from:  [facility]  Chief Complaint:  Chief Complaint  Patient presents with   Shortness of Breath   HPI: Vanessa [name] is a 62 y.o. female with medical history significant of heavy tobacco use (2 packs daily), T2DM, HTN, COPD, IBS, HTN, recent COVID on 1/21 who presents with increasing shortness of breath and cough.    Pt had initial symptoms of sinus congestion, persistent dry cough and was diagnosed with COVID on 1/21. She did not get antiviral and tried to treat with OTC medication. Symptoms of coughing and shortness of breath persisted. She also ran out of her Spiriva and Advair for the past week. Also continued to smoke 2 packs daily until a week ago.   In ED, afebrile, 83% room air. No oxygen use at baseline. Initially required 3L but now on HFNC 5L.    Wheezing improved with duoneb.    CXR showed multifocal/atypical pneumonia. WBC 14.    Has received IV solu-medrol, IV Mg and started on IV Rocephin and Azithromycin.    She was then transfer to [facility] for further management.  Review of Systems: As mentioned in the history of present illness. All other systems reviewed and are negative. Past Medical History:  Diagnosis Date   Asthma    No PFT performed   Blood transfusion without reported diagnosis    has to donate blood due to having "thick" blood.   COPD (chronic obstructive pulmonary disease) (HCC)    Depression    Diabetes mellitus 2002   GERD (gastroesophageal reflux disease)    Helicobacter pylori (H. pylori) infection    s/p triple therapy   Hepatic hemangioma    History of kidney stones    Hyperlipidemia    Hypertension    Hypertriglyceridemia    Panic attacks    mostly Agaraphobia   Pneumonia    Ventral hernia    Past Surgical History:  Procedure Laterality Date    CESAREAN SECTION     with 3 children   COLONOSCOPY  2018   OVARIAN CYST REMOVAL     Age 69   ROBOTIC ASSISTED BILATERAL SALPINGO OOPHERECTOMY Bilateral 11/23/2021   Procedure: XI ROBOTIC ASSISTED BILATERAL SALPINGO OOPHORECTOMY,  MINI LAPAROTOMY;  Surgeon: [name], MD;  Location: [facility];  Service: Gynecology;  Laterality: Bilateral;   ROBOTIC ASSISTED TOTAL HYSTERECTOMY N/A 11/23/2021   Procedure: XI ROBOTIC ASSISTED TOTAL HYSTERECTOMY, STAGING, CYSTOSCOPY;  Surgeon: [name], MD;  Location: [facility];  Service: Gynecology;  Laterality: N/A;   TUBAL LIGATION     UPPER GASTROINTESTINAL ENDOSCOPY  2018   Social History:  reports that she has been smoking cigarettes. She has a 43.00 pack-year smoking history. She has never used smokeless tobacco. She reports that she does not drink alcohol and does not use drugs.  Allergies  Allergen Reactions   Jardiance [Empagliflozin] Other (See Comments)    Severe yeast infection   Latex Rash   Avandia [Rosiglitazone]     Headaches     Family History  Problem Relation Age of Onset   Colon polyps Mother        benign   Ovarian cancer Mother 17   Breast cancer Maternal Aunt        dx late 68s   Diabetes Maternal Uncle    Colon cancer  Neg Hx    Stomach cancer Neg Hx    Endometrial cancer Neg Hx    Prostate cancer Neg Hx    Pancreatic cancer Neg Hx    Esophageal cancer Neg Hx    Rectal cancer Neg Hx     Prior to Admission medications   Medication Sig Start Date End Date Taking? Authorizing Provider  acetaminophen (TYLENOL) 500 MG tablet Take 1,000 mg by mouth every 6 (six) hours as needed for mild pain.    [provider]  ADVAIR DISKUS 250-50 MCG/ACT AEPB INHALE 1 INHALATION BY  MOUTH INTO THE LUNGS IN THE MORNING AND AT BEDTIME 10/04/21   Sanjuan Dame, MD  albuterol (VENTOLIN HFA) 108 (90 Base) MCG/ACT inhaler INHALE 1 TO 2 PUFFS INTO LUNGS EVERY 6 HOURS AS NEEDED FOR WHEEZING FOR SHORTNESS OF BREATH  05/22/22   Sanjuan Dame, MD  ALPRAZolam (XANAX) 0.5 MG tablet TAKE 1 TABLET BY MOUTH AT BEDTIME AS NEEDED FOR ANXIETY Patient taking differently: Take 0.5 mg by mouth at bedtime. 03/11/20   Sanjuan Dame, MD  atorvastatin (LIPITOR) 40 MG tablet Take 1 tablet (40 mg total) by mouth daily. 05/29/22   Johny Blamer, DO  cetirizine (ZYRTEC) 10 MG tablet Take 1 tablet (10 mg total) by mouth daily. 05/29/22   Johny Blamer, DO  cimetidine (TAGAMET) 200 MG tablet Take 1 tablet (200 mg total) by mouth 2 (two) times daily. 05/29/22   Johny Blamer, DO  citalopram (CELEXA) 40 MG tablet Take 1 tablet (40 mg total) by mouth daily. 05/29/22   Johny Blamer, DO  ezetimibe (ZETIA) 10 MG tablet Take 1 tablet (10 mg total) by mouth daily. 09/23/21 06/20/22  Sanjuan Dame, MD  glucose blood test strip Use as instructed 10/12/16   Kalman Shan Ratliff, DO  hydrOXYzine (VISTARIL) 100 MG capsule Take 1 capsule (100 mg total) by mouth in the morning, at noon, and at bedtime. 05/29/22   Johny Blamer, DO  Insulin Glargine-Lixisenatide (SOLIQUA) 100-33 UNT-MCG/ML SOPN Inject 20 Units into the skin daily. 05/29/22   Johny Blamer, DO  lisinopril (ZESTRIL) 10 MG tablet Take 1 tablet (10 mg total) by mouth daily. 05/29/22   Johny Blamer, DO  tiotropium (SPIRIVA HANDIHALER) 18 MCG inhalation capsule PLACE ONE CAPSULE INTO INHALER AND INHALE DAILY 02/20/22   Sanjuan Dame, MD    Physical Exam: Vitals:   08/05/22 1730 08/05/22 1800 08/05/22 1900 08/05/22 2016  BP: (!) 125/43 (!) 113/57 (!) 119/47 107/87  Pulse: 83 88 88 87  Resp:  (!) 22 17 20  $ Temp:   98.4 F (36.9 C) 98.2 F (36.8 C)  TempSrc:   Oral Oral  SpO2: 92% 93% 93% 92%  Weight:    63.2 kg  Height:    5' 2"$  (1.575 m)  Constitutional: NAD, calm, comfortable, nontoxic appearing elderly female sitting upright in bed Eyes: lids and conjunctivae normal ENMT: Mucous membranes are moist. Neck: normal, supple Respiratory: Left basilar  crackles but no wheezing.  Increased labored respiration with speaking.  No accessory muscle use.  On high flow nasal cannula 5 L. Cardiovascular: Regular rate and rhythm, no murmurs / rubs / gallops. No extremity edema.  Abdomen: no tenderness, Bowel sounds positive.  Musculoskeletal: no clubbing / cyanosis. No joint deformity upper and lower extremities. Normal muscle tone.  Skin: no rashes, lesions, ulcers. No induration Neurologic: CN 2-12 grossly intact. Strength 5/5 in all 4.  Psychiatric: Normal judgment and insight. Alert and oriented x 3. Normal mood.  Data Reviewed:  See HPI  Assessment and Plan: * Acute respiratory failure with hypoxia ([place]) -secondary to multifocal pneumonia and COPD exacerbation requiring HFNC 5 L on presentation -Continue IV Rocephin and azithromycin. Also check respiratory viral panel. - Continue daily IV Solu-Medrol -continue PRN duonebs -wean as tolerated with goal O2 of 88-92% -continue home bronchodilator  HTN (hypertension) -continue Lisinopril  Anxiety and depression -continue Citalopram and hydroxyzine  HLD (hyperlipidemia) -continue statin and zetia  Diabetes mellitus, type II, insulin dependent ([place]) -place on moderate SSI ACHS -Last A1c in 05/2022 at 10.4      Advance Care Planning:   Code Status: Full Code   Consults: none  Family Communication: daughter at bedside  Severity of Illness: The appropriate patient status for this patient is INPATIENT. Inpatient status is judged to be reasonable and necessary in order to provide the required intensity of service to ensure the patient's safety. The patient's presenting symptoms, physical exam findings, and initial radiographic and laboratory data in the context of their chronic comorbidities is felt to place them at high risk for further clinical deterioration. Furthermore, it is not anticipated that the patient will be medically stable for discharge from the hospital within 2 midnights  of admission.   * I certify that at the point of admission it is my clinical judgment that the patient will require inpatient hospital care spanning beyond 2 midnights from the point of admission due to high intensity of service, high risk for further deterioration and high frequency of surveillance required.*  Author: Orene Desanctis, DO 08/05/2022 10:02 PM  For on call review [email or web address].

## 2022-08-05 NOTE — ED Triage Notes (Signed)
Dx with COVID on 1/21. Symptoms have progressively gotten worse. Shortness of breath, productive cough +subjective fever

## 2022-08-05 NOTE — Progress Notes (Signed)
Virtual Visit Consent   Vanessa [name], you are scheduled for a virtual visit with Vanessa [name], [place], a [facility] provider, today.     Just as with appointments in the office, your consent must be obtained to participate.  Your consent will be active for this visit and any virtual visit you may have with one of our providers in the next 365 days.     If you have a MyChart account, a copy of this consent can be sent to you electronically.  All virtual visits are billed to your insurance company just like a traditional visit in the office.    As this is a virtual visit, video technology does not allow for your provider to perform a traditional examination.  This may limit your provider's ability to fully assess your condition.  If your provider identifies any concerns that need to be evaluated in person or the need to arrange testing (such as labs, EKG, etc.), we will make arrangements to do so.     Although advances in technology are sophisticated, we cannot ensure that it will always work on either your end or our end.  If the connection with a video visit is poor, the visit may have to be switched to a telephone visit.  With either a video or telephone visit, we are not always able to ensure that we have a secure connection.     I need to obtain your verbal consent now.   Are you willing to proceed with your visit today? YES   Vanessa [name] has provided verbal consent on 08/05/2022 for a virtual visit (video or telephone).   Vanessa Hassell Done, FNP   Date: 08/05/2022 1:21 PM   Virtual Visit via Video Note   I, Vanessa [name], connected with Vanessa [name] ([id number], [birth year]) on 08/05/22 at  1:30 PM EST by a video-enabled telemedicine application and verified that I am speaking with the correct person using two identifiers.  Location: Patient: Virtual Visit Location Patient: Home Provider: Virtual Visit Location Provider: Mobile   I discussed the limitations of evaluation  and management by telemedicine and the availability of in person appointments. The patient expressed understanding and agreed to proceed.    History of Present Illness: Vanessa [name] is a 62 y.o. who identifies as a female who was assigned female at birth, and is being seen today for pneumonia.  HPI: Vanessa [name] is with patient. They state on 07/16/22 patient had covid. Since then she has not improved. Her symptoms have rebound and gotten worse. Daughter is a paramedic and listen to her lungs and she sounds like pneumonia. Pulse ox 84-88 in room air.   Pneumonia She complains of cough, shortness of breath and sputum production. Associated symptoms include a fever.    Review of Systems  Constitutional:  Positive for chills and fever.  HENT:  Positive for congestion.   Respiratory:  Positive for cough, sputum production and shortness of breath.   Cardiovascular:  Positive for orthopnea.    Problems:  Patient Active Problem List   Diagnosis Date Noted   Claudication of lower extremity ([place]) 05/31/2022   Genetic testing 01/02/2022   Ovarian cancer ([place]) 12/02/2021   Seasonal allergies 09/23/2021   Pelvic mass 05/12/2020   Elevated CEA 05/12/2020   Polycythemia, secondary [id number]   Umbilical hernia without obstruction and without gangrene 08/09/2017   Essential hypertension, benign 03/11/2014   Hepatic steatosis 05/30/2013   Irritable bowel syndrome (IBS) 01/09/2013   Healthcare  maintenance 01/09/2013   Baker's cyst of knee 03/20/2011   COPD (chronic obstructive pulmonary disease) ([place]) 12/01/2010   Anxiety and depression 07/23/2008   Tobacco dependence 10/23/2007   HLD (hyperlipidemia) 06/14/2006   ASTHMA 06/14/2006   GERD 06/14/2006   Diabetes mellitus, type II, insulin dependent ([place]) 06/26/2002    Allergies:  Allergies  Allergen Reactions   Jardiance [Empagliflozin] Other (See Comments)    Severe yeast infection   Latex Rash   Avandia [Rosiglitazone]     Headaches     Medications:  Current Outpatient Medications:    acetaminophen (TYLENOL) 500 MG tablet, Take 1,000 mg by mouth every 6 (six) hours as needed for mild pain., Disp: , Rfl:    ADVAIR DISKUS 250-50 MCG/ACT AEPB, INHALE 1 INHALATION BY  MOUTH INTO THE LUNGS IN THE MORNING AND AT BEDTIME, Disp: 180 each, Rfl: 3   albuterol (VENTOLIN HFA) 108 (90 Base) MCG/ACT inhaler, INHALE 1 TO 2 PUFFS INTO LUNGS EVERY 6 HOURS AS NEEDED FOR WHEEZING FOR SHORTNESS OF BREATH, Disp: 9 g, Rfl: 3   ALPRAZolam (XANAX) 0.5 MG tablet, TAKE 1 TABLET BY MOUTH AT BEDTIME AS NEEDED FOR ANXIETY (Patient taking differently: Take 0.5 mg by mouth at bedtime.), Disp: 30 tablet, Rfl: 0   atorvastatin (LIPITOR) 40 MG tablet, Take 1 tablet (40 mg total) by mouth daily., Disp: 90 tablet, Rfl: 3   cetirizine (ZYRTEC) 10 MG tablet, Take 1 tablet (10 mg total) by mouth daily., Disp: 90 tablet, Rfl: 3   cimetidine (TAGAMET) 200 MG tablet, Take 1 tablet (200 mg total) by mouth 2 (two) times daily., Disp: 180 tablet, Rfl: 3   citalopram (CELEXA) 40 MG tablet, Take 1 tablet (40 mg total) by mouth daily., Disp: 90 tablet, Rfl: 3   ezetimibe (ZETIA) 10 MG tablet, Take 1 tablet (10 mg total) by mouth daily., Disp: 90 tablet, Rfl: 2   glucose blood test strip, Use as instructed, Disp: 100 each, Rfl: 12   hydrOXYzine (VISTARIL) 100 MG capsule, Take 1 capsule (100 mg total) by mouth in the morning, at noon, and at bedtime., Disp: 30 capsule, Rfl: 3   Insulin Glargine-Lixisenatide (SOLIQUA) 100-33 UNT-MCG/ML SOPN, Inject 20 Units into the skin daily., Disp: 3 mL, Rfl: 11   lisinopril (ZESTRIL) 10 MG tablet, Take 1 tablet (10 mg total) by mouth daily., Disp: 90 tablet, Rfl: 3   tiotropium (SPIRIVA HANDIHALER) 18 MCG inhalation capsule, PLACE ONE CAPSULE INTO INHALER AND INHALE DAILY, Disp: 90 capsule, Rfl: 3  Observations/Objective: Patient is well-developed, well-nourished in no acute distress.  Resting comfortably  at home.  Head is  normocephalic, atraumatic.  No labored breathing.  Speech is clear and coherent with logical content.  Patient is alert and oriented at baseline.  Patient pale  Assessment and Plan:  [name] in today with chief complaint of Pneumonia   1. SOB (shortness of breath) 2. Low oxygen saturation  Due to low oxygen saturation and SOB- NEEDS  TO GO TO THE ED ASAP- daughter agreed to take her.     The above assessment and management plan was discussed with the patient. The patient verbalized understanding of and has agreed to the management plan. Patient is aware to call the clinic if symptoms persist or worsen. Patient is aware when to return to the clinic for a follow-up visit. Patient educated on when it is appropriate to go to the emergency department.   Vanessa Hassell Done, FNP    Follow Up Instructions: I discussed the  assessment and treatment plan with the patient. The patient was provided an opportunity to ask questions and all were answered. The patient agreed with the plan and demonstrated an understanding of the instructions.  A copy of instructions were sent to the patient via MyChart.  The patient was advised to call back or seek an in-person evaluation if the symptoms worsen or if the condition fails to improve as anticipated.  Time:  I spent 6 minutes with the patient via telehealth technology discussing the above problems/concerns.    Vanessa Hassell Done, FNP

## 2022-08-05 NOTE — Assessment & Plan Note (Addendum)
-  secondary to multifocal pneumonia and COPD exacerbation requiring HFNC 5 L on presentation -Continue IV Rocephin and azithromycin. Also check respiratory viral panel. - Continue daily IV Solu-Medrol -continue PRN duonebs -wean as tolerated with goal O2 of 88-92% -continue home bronchodilator

## 2022-08-05 NOTE — Progress Notes (Signed)
RT placed patient on oxygen at 3L due to the fact her saturation was 83% RA. Patient appears to be tolerating well with a saturation at 93%

## 2022-08-05 NOTE — ED Notes (Signed)
Patient placed on a 5L HFNC from a 3L [place]. Her saturations were 88% on 3L [place]. Patient appears to be tolerating well at this time with no distress and a saturation of 92%.

## 2022-08-05 NOTE — ED Notes (Signed)
External female catheter placed at the request of patient and patient's family

## 2022-08-05 NOTE — Assessment & Plan Note (Signed)
-  continue Citalopram and hydroxyzine

## 2022-08-05 NOTE — ED Provider Notes (Signed)
[facility] Provider Note   CSN: [id number] Arrival date & time: 08/05/22  1408     History  Chief Complaint  Patient presents with   Shortness of Breath    KIMORIA EBARB is a 62 y.o. female with past medical history significant for diabetes, COPD, tobacco dependence, hypertension, polycythemia secondary to heavy tobacco abuse, hyperlipidemia, asthma who presents with concern for shortness of breath, productive cough, chills, with recent COVID diagnosis on 1/21.  Patient reports that she normally smokes around 1 to 2 packs a day, has not smoked anything since Sunday.  She reports that she is out of her Advair, Spiriva inhalers for the last week.  Additionally is out of her insulin, not sure what her blood sugars running at this time.  She missed her last serial blood draw for her polycythemia, she is not taking any additional treatments for polycythemia at this time.  She denies any chest pain at rest, reports that she gets some bilateral rib pain with coughing.  She denies any nausea, vomiting, abdominal pain.  On initial exam patient is on 3 L nasal cannula, does not normally require any oxygen at baseline.   Shortness of Breath      Home Medications Prior to Admission medications   Medication Sig Start Date End Date Taking? Authorizing Provider  acetaminophen (TYLENOL) 500 MG tablet Take 1,000 mg by mouth every 6 (six) hours as needed for mild pain.    [provider]  ADVAIR DISKUS 250-50 MCG/ACT AEPB INHALE 1 INHALATION BY  MOUTH INTO THE LUNGS IN THE MORNING AND AT BEDTIME 10/04/21   Sanjuan Dame, MD  albuterol (VENTOLIN HFA) 108 (90 Base) MCG/ACT inhaler INHALE 1 TO 2 PUFFS INTO LUNGS EVERY 6 HOURS AS NEEDED FOR WHEEZING FOR SHORTNESS OF BREATH 05/22/22   Sanjuan Dame, MD  ALPRAZolam (XANAX) 0.5 MG tablet TAKE 1 TABLET BY MOUTH AT BEDTIME AS NEEDED FOR ANXIETY Patient taking differently: Take 0.5 mg by mouth at bedtime.  03/11/20   Sanjuan Dame, MD  atorvastatin (LIPITOR) 40 MG tablet Take 1 tablet (40 mg total) by mouth daily. 05/29/22   Johny Blamer, DO  cetirizine (ZYRTEC) 10 MG tablet Take 1 tablet (10 mg total) by mouth daily. 05/29/22   Johny Blamer, DO  cimetidine (TAGAMET) 200 MG tablet Take 1 tablet (200 mg total) by mouth 2 (two) times daily. 05/29/22   Johny Blamer, DO  citalopram (CELEXA) 40 MG tablet Take 1 tablet (40 mg total) by mouth daily. 05/29/22   Johny Blamer, DO  ezetimibe (ZETIA) 10 MG tablet Take 1 tablet (10 mg total) by mouth daily. 09/23/21 06/20/22  Sanjuan Dame, MD  glucose blood test strip Use as instructed 10/12/16   Kalman Shan Ratliff, DO  hydrOXYzine (VISTARIL) 100 MG capsule Take 1 capsule (100 mg total) by mouth in the morning, at noon, and at bedtime. 05/29/22   Johny Blamer, DO  Insulin Glargine-Lixisenatide (SOLIQUA) 100-33 UNT-MCG/ML SOPN Inject 20 Units into the skin daily. 05/29/22   Johny Blamer, DO  lisinopril (ZESTRIL) 10 MG tablet Take 1 tablet (10 mg total) by mouth daily. 05/29/22   Johny Blamer, DO  tiotropium (SPIRIVA HANDIHALER) 18 MCG inhalation capsule PLACE ONE CAPSULE INTO INHALER AND INHALE DAILY 02/20/22   Sanjuan Dame, MD      Allergies    Jardiance [empagliflozin], Latex, and Avandia [rosiglitazone]    Review of Systems   Review of Systems  Respiratory:  Positive for shortness of breath.  All other systems reviewed and are negative.   Physical Exam Updated Vital Signs BP (!) 113/57 (BP Location: Left Arm)   Pulse 88   Temp 98.3 F (36.8 C) (Oral)   Resp (!) 22   Ht 5' 2"$  (1.575 m)   Wt 65.3 kg   LMP 12/24/2013   SpO2 93%   BMI 26.34 kg/m  Physical Exam Vitals and nursing note reviewed.  Constitutional:      General: She is not in acute distress.    Appearance: Normal appearance. She is ill-appearing.  HENT:     Head: Normocephalic and atraumatic.  Eyes:     General:        Right eye: No discharge.         Left eye: No discharge.  Cardiovascular:     Rate and Rhythm: Regular rhythm. Tachycardia present.     Heart sounds: No murmur heard.    No friction rub. No gallop.  Pulmonary:     Effort: Pulmonary effort is normal. Tachypnea present.     Breath sounds: Normal breath sounds.     Comments: Creased respiratory effort, scattered expiratory wheezing, she sounds improved after DuoNeb, but still having some increased work of breathing, requiring oxygen to maintain stable oxygen saturation. Abdominal:     General: Bowel sounds are normal.     Palpations: Abdomen is soft.  Skin:    General: Skin is warm and dry.     Capillary Refill: Capillary refill takes less than 2 seconds.  Neurological:     Mental Status: She is alert and oriented to person, place, and time.  Psychiatric:        Mood and Affect: Mood normal.        Behavior: Behavior normal.     ED Results / Procedures / Treatments   Labs (all labs ordered are listed, but only abnormal results are displayed) Labs Reviewed  BASIC METABOLIC PANEL - Abnormal; Notable for the following components:      Result Value   Glucose, Bld 280 (*)    All other components within normal limits  CBC - Abnormal; Notable for the following components:   WBC 14.0 (*)    RBC 5.48 (*)    Hemoglobin 15.1 (*)    HCT 47.8 (*)    Platelets 657 (*)    All other components within normal limits  CBG MONITORING, ED - Abnormal; Notable for the following components:   Glucose-Capillary 263 (*)    All other components within normal limits    EKG EKG Interpretation  Date/Time:  Saturday August 05 2022 14:52:38 EST Ventricular Rate:  89 PR Interval:  147 QRS Duration: 84 QT Interval:  359 QTC Calculation: 437 R Axis:   100 Text Interpretation: Sinus rhythm Anteroseptal infarct, age indeterminate Confirmed by Lavenia Atlas [id number]) on 08/05/2022 4:38:13 PM  Radiology DG Chest Port 1 View  Result Date: 08/05/2022 CLINICAL DATA:  Shortness of  breath. COVID positive 1/21 with progressive symptoms. Cough and congestion. EXAM: PORTABLE CHEST 1 VIEW COMPARISON:  Chest radiograph 03/10/2018 FINDINGS: Heart is upper normal in size. Normal mediastinal contours with mild aortic atherosclerosis. There are ill-defined opacities in the lung bases, left greater than right. Background interstitial and bronchial thickening which appears chronic. Mild chronic hyperinflation. Blunting of the costophrenic angles is favored to be due to hyperinflation rather than pleural effusions. No pneumothorax. Suspect small bilateral cervical ribs. IMPRESSION: 1. Ill-defined opacities in the lung bases, left greater than right, suspicious for multifocal pneumonia,  including COVID-19. 2. Background interstitial and bronchial thickening which appears chronic. 3. Borderline cardiomegaly. Electronically Signed   By: Keith Rake M.D.   On: 08/05/2022 15:30    Procedures .Critical Care  Performed by: Anselmo Pickler, PA-C Authorized by: Anselmo Pickler, PA-C   Critical care provider statement:    Critical care time (minutes):  35   Critical care was necessary to treat or prevent imminent or life-threatening deterioration of the following conditions:  Respiratory failure   Critical care was time spent personally by me on the following activities:  Development of treatment plan with patient or surrogate, discussions with consultants, evaluation of patient's response to treatment, examination of patient, ordering and review of laboratory studies, ordering and review of radiographic studies, ordering and performing treatments and interventions, pulse oximetry, re-evaluation of patient's condition and review of old charts   Care discussed with: admitting provider       Medications Ordered in ED Medications  ipratropium-albuterol (DUONEB) 0.5-2.5 (3) MG/3ML nebulizer solution 3 mL (3 mLs Nebulization Not Given 08/05/22 1455)  methylPREDNISolone sodium succinate  (SOLU-MEDROL) 125 mg/2 mL injection 125 mg (125 mg Intravenous Given 08/05/22 1524)  magnesium sulfate IVPB 2 g 50 mL (has no administration in time range)  ipratropium-albuterol (DUONEB) 0.5-2.5 (3) MG/3ML nebulizer solution (3 mLs  Given 08/05/22 1453)  cefTRIAXone (ROCEPHIN) 1 g in sodium chloride 0.9 % 100 mL IVPB (0 g Intravenous Stopped 08/05/22 1724)  azithromycin (ZITHROMAX) 500 mg in sodium chloride 0.9 % 250 mL IVPB (500 mg Intravenous New Bag/Given 08/05/22 1727)  ipratropium-albuterol (DUONEB) 0.5-2.5 (3) MG/3ML nebulizer solution 3 mL (3 mLs Nebulization Given 08/05/22 1647)    ED Course/ Medical Decision Making/ A&P Clinical Course as of 08/05/22 1841  Sat Aug 05, 2022  1638 Known covid 1/21 [CP]    Clinical Course User Index [CP] Anselmo Pickler, PA-C                             Medical Decision Making Amount and/or Complexity of Data Reviewed Labs: ordered. Radiology: ordered.  Risk Prescription drug management.   This patient is a 62 y.o. female who presents to the ED for concern of shortness of breath, congestion, this involves an extensive number of treatment options, and is a complaint that carries with it a high risk of complications and morbidity. The emergent differential diagnosis prior to evaluation includes, but is not limited to,  asthma exacerbation, COPD exacerbation, acute upper respiratory infection, acute bronchitis, chronic bronchitis, interstitial lung disease, ARDS, PE, pneumonia, atypical ACS, carbon monoxide poisoning, spontaneous pneumothorax, new CHF vs CHF exacerbation, versus other . This is not an exhaustive differential.   Past Medical History / Co-morbidities / Social History: COPD, asthma, polycythemia, hypertension, hyperlipidemia, diabetes  Additional history: Chart reviewed. Pertinent results include: Reviewed outpatient oncology notes, patient gets serial phlebotomy as treatment for her polycythemia, they think that it is induced by  her heavy tobacco abuse.,  Reviewed lab work, imaging from admission back in May of last year for removal of large fibroid which led to total hysterectomy.  Physical Exam: Physical exam performed. The pertinent findings include: On exam patient with some wheezing, increased work of breathing, tachycardia, tachypnea, which improved after DuoNeb x 1, she is still requiring oxygen to maintain stable oxygen saturation 93%, she was 83% on room air on arrival.  No tenderness palpation of the chest wall.  Lab Tests: I ordered, and personally interpreted labs.  The pertinent results include: CBC notable for leukocytosis, white blood cells 14, her CBG is elevated to 63, she does have a pan cytosis likely secondary to her polycythemia, hemoglobin 15.1, platelets 657.  BMP shows hyperglycemia, glucose 280, otherwise unremarkable.   Imaging Studies: I ordered imaging studies including plain film chest x-ray. I independently visualized and interpreted imaging which showed ill-defined opacities of bilateral lungs, left greater than right suspicious for atypical pneumonia. I agree with the radiologist interpretation.   Cardiac Monitoring:  The patient was maintained on a cardiac monitor.  My attending physician Dr. Dina Rich viewed and interpreted the cardiac monitored which showed an underlying rhythm of: NSR. I agree with this interpretation.   Medications: I ordered medication including DuoNeb, Solu-Medrol for COPD exacerbation, ceftriaxone, azithromycin for atypical pneumonia process. Reevaluation of the patient after these medicines showed that the patient did have some improvement after administration of DuoNeb for her overall breathing, but she is still requiring nasal cannula to maintain stable oxygen saturation.  Consultations Obtained: I requested consultation with the hospitalist, spoke with Dr. Flossie Buffy,  and discussed lab and imaging findings as well as pertinent plan - they recommend: Admission for acute  hypoxic respiratory failure with COPD exacerbation, CAP, status post COVID   Disposition: After consideration of the diagnostic results and the patients response to treatment, I feel that patient would benefit from admission at this time.   I discussed this case with my attending physician Dr. Dina Rich who cosigned this note including patient's presenting symptoms, physical exam, and planned diagnostics and interventions. Attending physician stated agreement with plan or made changes to plan which were implemented.    Final Clinical Impression(s) / ED Diagnoses Final diagnoses:  Pneumonia of left lower lobe due to infectious organism    Rx / DC Orders ED Discharge Orders     None         Anselmo Pickler, PA-C 08/05/22 1841    Lorelle Gibbs, DO 08/05/22 2347

## 2022-08-05 NOTE — Assessment & Plan Note (Signed)
-  place on moderate SSI ACHS -Last A1c in 05/2022 at 10.4

## 2022-08-06 LAB — BASIC METABOLIC PANEL
Anion gap: 15 (ref 5–15)
BUN: 9 mg/dL (ref 8–23)
CO2: 20 mmol/L — ABNORMAL LOW (ref 22–32)
Calcium: 8.7 mg/dL — ABNORMAL LOW (ref 8.9–10.3)
Chloride: 98 mmol/L (ref 98–111)
Creatinine, Ser: 0.56 mg/dL (ref 0.44–1.00)
GFR, Estimated: >60 mL/min (ref >60)
Glucose, Bld: 397 mg/dL — ABNORMAL HIGH (ref 70–99)
Potassium: 5.1 mmol/L (ref 3.5–5.1)
Sodium: 133 mmol/L — ABNORMAL LOW (ref 135–145)

## 2022-08-06 LAB — CBC
HCT: 45.7 % (ref 36.0–46.0)
Hemoglobin: 14.6 g/dL (ref 12.0–15.0)
MCH: 28.2 pg (ref 26.0–34.0)
MCHC: 31.9 g/dL (ref 30.0–36.0)
MCV: 88.2 fL (ref 80.0–100.0)
Platelets: 489 10*3/uL — ABNORMAL HIGH (ref 150–400)
RBC: 5.18 MIL/uL — ABNORMAL HIGH (ref 3.87–5.11)
RDW: 13.7 % (ref 11.5–15.5)
WBC: 13.3 10*3/uL — ABNORMAL HIGH (ref 4.0–10.5)
nRBC: 0 % (ref 0.0–0.2)

## 2022-08-06 LAB — GLUCOSE, CAPILLARY
Glucose-Capillary: 423 mg/dL — ABNORMAL HIGH (ref 70–99)
Glucose-Capillary: 522 mg/dL (ref 70–99)
Glucose-Capillary: 104 mg/dL — ABNORMAL HIGH (ref 70–99)
Glucose-Capillary: 215 mg/dL — ABNORMAL HIGH (ref 70–99)

## 2022-08-06 LAB — HIV ANTIBODY (ROUTINE TESTING W REFLEX): HIV Screen 4th Generation wRfx: NONREACTIVE

## 2022-08-06 MED ORDER — AZITHROMYCIN 250 MG PO TABS
500.0000 mg | ORAL_TABLET | Freq: Every day | ORAL | Status: DC
  Administered 2022-08-07 – 2022-08-09 (×3): 500 mg via ORAL
  Filled 2022-08-06 (×3): qty 2

## 2022-08-06 MED ORDER — SODIUM CHLORIDE 0.9 % IV SOLN
1.0000 g | INTRAVENOUS | Status: DC
  Administered 2022-08-07 – 2022-08-09 (×3): 1 g via INTRAVENOUS
  Filled 2022-08-06 (×3): qty 10

## 2022-08-06 MED ORDER — INSULIN ASPART 100 UNIT/ML IJ SOLN
20.0000 [IU] | Freq: Once | INTRAMUSCULAR | Status: AC
  Administered 2022-08-06: 20 [IU] via SUBCUTANEOUS

## 2022-08-06 MED ORDER — INSULIN GLARGINE-YFGN 100 UNIT/ML ~~LOC~~ SOLN
20.0000 [IU] | Freq: Every day | SUBCUTANEOUS | Status: DC
  Administered 2022-08-06 – 2022-08-08 (×3): 20 [IU] via SUBCUTANEOUS
  Filled 2022-08-06 (×4): qty 0.2

## 2022-08-06 MED ORDER — GUAIFENESIN 100 MG/5ML PO LIQD
5.0000 mL | ORAL | Status: DC | PRN
  Administered 2022-08-06 – 2022-08-07 (×2): 5 mL via ORAL
  Filled 2022-08-06 (×2): qty 10

## 2022-08-06 MED ORDER — ACETAMINOPHEN 325 MG PO TABS
650.0000 mg | ORAL_TABLET | Freq: Four times a day (QID) | ORAL | Status: DC | PRN
  Administered 2022-08-06 – 2022-08-09 (×5): 650 mg via ORAL
  Filled 2022-08-06 (×5): qty 2

## 2022-08-06 NOTE — Progress Notes (Signed)
NAME:  Vanessa [name], MRN:  [id number], DOB:  1960/07/19, LOS: 1 ADMISSION DATE:  08/05/2022  Subjective  Patient evaluated at bedside this AM. Patient reports she is feeling better today. Continues to have some cough but shortness of breath feels better. Describes having COVID in late January and never fully recovering from it. Over the last few days her respiratory status worsened. Does note that she's been out of her inhaler for the last week.  Objective   Blood pressure (!) 125/47, pulse 69, temperature 98.2 F (36.8 C), temperature source Oral, resp. rate 20, height 5' 2"$  (1.575 m), weight 63.2 kg, last menstrual period 12/24/2013, SpO2 90 %.     Intake/Output Summary (Last 24 hours) at 08/06/2022 1612 Last data filed at 08/05/2022 1843 Gross per 24 hour  Intake 350 ml  Output --  Net 350 ml   Filed Weights   08/05/22 1527 08/05/22 2016  Weight: 65.3 kg 63.2 kg   Physical Exam: General: Resting comfortably in no acute distress CV: Regular rate, rhythm. No murmurs appreciated. Warm extremities. Pulm: Normal work of breathing on 6L [place]. No wheezing, rales, rhonchi appreciated.  Abdomen: Soft, non-tender, non-distended. Normoactive bowel sounds. MSK: Normal bulk, tone. No peripheral edema appreciated.  Neuro: Awake, alert, conversing appropriately. Grossly in tact.  Psych: Normal mood, affect, speech.   Labs       Latest Ref Rng & Units 08/06/2022    2:13 AM 08/05/2022    3:10 PM 05/11/2022   12:08 PM  CBC  WBC 4.0 - 10.5 K/uL 13.3  14.0  11.3   Hemoglobin 12.0 - 15.0 g/dL 14.6  15.1  17.2   Hematocrit 36.0 - 46.0 % 45.7  47.8  50.2   Platelets 150 - 400 K/uL 489  657  372       Latest Ref Rng & Units 08/06/2022    2:13 AM 08/05/2022    3:10 PM 11/24/2021    4:46 AM  BMP  Glucose 70 - 99 mg/dL 397  280  270   BUN 8 - 23 mg/dL 9  8  12   $ Creatinine 0.44 - 1.00 mg/dL 0.56  0.47  0.64   Sodium 135 - 145 mmol/L 133  136  136   Potassium 3.5 - 5.1 mmol/L 5.1  4.6  4.8    Chloride 98 - 111 mmol/L 98  99  103   CO2 22 - 32 mmol/L 20  28  26   $ Calcium 8.9 - 10.3 mg/dL 8.7  9.7  8.6     Summary   Vanessa [name] is 62yo person with type II diabetes mellitus, COPD, previous tobacco use disorder, hypertension, recent COVID-19 infection admitted 2/10 with acute hypoxic respiratory failure likely from multi-focal pneumonia.   Assessment & Plan:  Principal Problem:   Acute respiratory failure with hypoxia (HCC) Active Problems:   Diabetes mellitus, type II, insulin dependent (HCC)   HLD (hyperlipidemia)   Anxiety and depression   COPD with acute exacerbation (HCC)   Multifocal pneumonia   HTN (hypertension)  #Acute hypoxic respiratory failure 2/2 multifocal pneumonia #Chronic obstructive pulmonary disease This morning patient hemodynamically stable, afebrile. On exam she appears comfortable in no respiratory distress and has no wheezing or rhonchi. Suspect that the majority of her symptoms are caused by pneumonia rather than COPD exacerbation. Will plan to hold any further systemic steroids and continue with antibiotics. If no improvement over the next few days might need to consider broadening for MRSA coverage given  recent viral illness. - Hold further methylprednisolone - Continue IV ceftriaxone, azithromycin - Robitussin every 4 hours as needed - Continue Dulera, Incruse daily - DuoNebs every 6 hours as needed - SpO2 goal 88-92%  #Type II diabetes mellitus Last A1c 10.4% on 12/23. On arrival patient had significantly elevated sugars in 400-500's. No clinical signs of DKA or on lab work. At home she is on [place] 20u daily.  - Semglee 20u daily - SSI  #Tobacco use disorder Patient reports she quit smoking roughly one week ago. Mentions she was tired of things tasting like ashes and has not had any cravings since quitting. Congratulated her on on this success and let her know we have medications that can help if her cravings return. - Monitor for  cravings  #Hypertension - Continue home lisinopril  #Anxiety #Depression - Continue home citalopram and hydroxyzine  Best practice:  DIET: HH IVF: n/a DVT PPX: lovenox BOWEL: na CODE: FULL FAM COM: discussed over the phone with patient's daughter  Sanjuan Dame, MD Internal Medicine Resident PGY-3 PAGER: [phone] 08/06/2022 4:12 PM  If after hours (below), please contact on-call pager: [phone] 5PM-7AM Monday-Friday 1PM-7AM Saturday-Sunday

## 2022-08-06 NOTE — Progress Notes (Signed)
IMTS patient, discussed with teaching service, they'll see today. Will sign off.  Please let us know if additional assistance needed. Fayrene Helper, MD [facility]

## 2022-08-06 NOTE — Hospital Course (Signed)
Vanessa [name] is a 62 y.o. female with pertinent PMH of T2DM, COPD, TUD who recently quit smoking, HTN, and recent [place] who presented with progressive DOE and cough and is admitted for acute hypoxemic respiratory failure 2/2 multifocal pneumonia.     #Acute hypoxemic respiratory failure 2/2 multifocal pneumonia #Chronic obstructive pulmonary disease Presented with oxygen saturation down to 83% on room air and required 5 L nasal cannula.  She was initially treated for COPD exacerbation with steroids and antibiotics.  It was found that she had not been taking her inhalers at home due to not having them.  Steroids were subsequently stopped and she was given 5 days of ceftriaxone and azithromycin as well as Dulera and Incruse.  We were able to get her down to 2 L at rest and 3 L with activity prior to discharge.  She was discharged with home oxygen with plans to wean as tolerated.    #Type II diabetes mellitus She was recently started on Soliqua 20 units and has been doing well on this.  She was given Semglee 20 units daily here and sliding scale insulin.  Although her sugars were high after receiving IV steroids they did level out and she was discharged home to resume Soliqua 20 units daily.   #Leukocytosis After receiving steroids patient developed a leukocytosis up to 17 without fevers.  This trended down prior to discharge and after discontinuing steroids.    #Tobacco use disorder Patient reports she quit smoking on 07/30/2022.  She was started on Wellbutrin here, did well on this.  She will continue this after discharge and we congratulated her on her continued tobacco cessation.  #Hypertension Continued home lisinopril 10 mg daily   #Anxiety #Depression Continued home citalopram

## 2022-08-07 DIAGNOSIS — F17210 Nicotine dependence, cigarettes, uncomplicated: Secondary | ICD-10-CM | POA: Diagnosis not present

## 2022-08-07 DIAGNOSIS — J449 Chronic obstructive pulmonary disease, unspecified: Secondary | ICD-10-CM

## 2022-08-07 DIAGNOSIS — J188 Other pneumonia, unspecified organism: Secondary | ICD-10-CM | POA: Diagnosis not present

## 2022-08-07 DIAGNOSIS — J9601 Acute respiratory failure with hypoxia: Secondary | ICD-10-CM

## 2022-08-07 LAB — GLUCOSE, CAPILLARY
Glucose-Capillary: 203 mg/dL — ABNORMAL HIGH (ref 70–99)
Glucose-Capillary: 171 mg/dL — ABNORMAL HIGH (ref 70–99)
Glucose-Capillary: 168 mg/dL — ABNORMAL HIGH (ref 70–99)
Glucose-Capillary: 231 mg/dL — ABNORMAL HIGH (ref 70–99)

## 2022-08-07 LAB — BASIC METABOLIC PANEL
Anion gap: 9 (ref 5–15)
BUN: 11 mg/dL (ref 8–23)
CO2: 28 mmol/L (ref 22–32)
Calcium: 8.9 mg/dL (ref 8.9–10.3)
Chloride: 98 mmol/L (ref 98–111)
Creatinine, Ser: 0.46 mg/dL (ref 0.44–1.00)
GFR, Estimated: >60 mL/min (ref >60)
Glucose, Bld: 200 mg/dL — ABNORMAL HIGH (ref 70–99)
Potassium: 4.1 mmol/L (ref 3.5–5.1)
Sodium: 135 mmol/L (ref 135–145)

## 2022-08-07 LAB — CBC
HCT: 42.9 % (ref 36.0–46.0)
Hemoglobin: 13.9 g/dL (ref 12.0–15.0)
MCH: 28 pg (ref 26.0–34.0)
MCHC: 32.4 g/dL (ref 30.0–36.0)
MCV: 86.5 fL (ref 80.0–100.0)
Platelets: 458 10*3/uL — ABNORMAL HIGH (ref 150–400)
RBC: 4.96 MIL/uL (ref 3.87–5.11)
RDW: 13.7 % (ref 11.5–15.5)
WBC: 17.1 10*3/uL — ABNORMAL HIGH (ref 4.0–10.5)
nRBC: 0 % (ref 0.0–0.2)

## 2022-08-07 MED ORDER — BUPROPION HCL ER (SR) 150 MG PO TB12
150.0000 mg | ORAL_TABLET | Freq: Every day | ORAL | Status: AC
  Administered 2022-08-07 – 2022-08-09 (×3): 150 mg via ORAL
  Filled 2022-08-07 (×3): qty 1

## 2022-08-07 MED ORDER — GUAIFENESIN 100 MG/5ML PO LIQD
5.0000 mL | ORAL | Status: DC
  Administered 2022-08-07 – 2022-08-09 (×8): 5 mL via ORAL
  Filled 2022-08-07 (×10): qty 10

## 2022-08-07 MED ORDER — BUPROPION HCL ER (SR) 150 MG PO TB12: 150.0000 mg | ORAL_TABLET | Freq: Two times a day (BID) | ORAL | Status: DC

## 2022-08-07 NOTE — Progress Notes (Signed)
PT Cancellation Note  Patient Details Name: Vanessa [name] MRN: [id number] DOB: 1961/06/20   Cancelled Treatment:    Reason Eval/Treat Not Completed: (P) Other (comment) Pt eating breakfast with family in room, and request PT follow back later this afternoon.  Vanessa [name] B. Migdalia Dk PT, DPT Acute Rehabilitation Services Please use secure chat or  Call Office [phone]    [place] 08/07/2022, 12:00 PM

## 2022-08-07 NOTE — Inpatient Diabetes Management (Signed)
Inpatient Diabetes Program Recommendations  AACE/ADA: New Consensus Statement on Inpatient Glycemic Control (2015)  Target Ranges:  Prepandial:   less than 140 mg/dL      Peak postprandial:   less than 180 mg/dL (1-2 hours)      Critically ill patients:  140 - 180 mg/dL   Lab Results  Component Value Date   GLUCAP 171 (H) 08/07/2022   HGBA1C 10.4 (A) 05/29/2022    Review of Glycemic Control  Diabetes history: type 2 Outpatient Diabetes medications: Soliqua 20 units daily Current orders for Inpatient glycemic control: Semglee 20 units daily, Novolog 0-15 units correction scale TID & HS  Inpatient Diabetes Program Recommendations:   Spoke with patient about her diabetes. States that she takes [place] 20 units daily (has been on this and insurance will cover it for her) States that she had not taken it over a 3 week period when she got sick and had not been checking blood sugars and was not eating well. States that she has issues with her diabetes control, but knows what she needs to do to improve her A1C of 10.4%. Suggested that she needs to check her blood sugars more often when sick and let the PCP know what the blood sugars have been. There may need to be an adjustment of insulin dosage.   Patient discussed that she does eat CHO and meats, rice, pasta. She is aware that protein with CHO is important, but she cannot eat whole grain breads. Recommended more vegetables and salads. States that she does drink diet drinks.   Harvel Ricks RN BSN CDE Diabetes Coordinator Pager: [phone]  8am-5pm

## 2022-08-07 NOTE — Progress Notes (Signed)
HD#2 Subjective:   Summary: Vanessa [name] is a 62 y.o. female with pertinent PMH of T2DM, COPD, TUD who recently quit smoking, HTN, and recent [place] who presented with progressive DOE and cough and is admitted for acute hypoxemic respiratory failure 2/2 multifocal pneumonia.   Overnight Events: None   Pt notes improvement in overall symptoms overnight. She is now on 4 L [place] with stable sats 90+. She states the robitussin is working well for her cough. She does get SOB when she takes off the O2 to go to the bathroom but is comfortable otherwise. When talking further she has not been on hydroxyzine at home for over a month.   Objective:  Vital signs in last 24 hours: Vitals:   08/07/22 0005 08/07/22 0535 08/07/22 0748 08/07/22 0811  BP: (!) 122/52 (!) 141/42 (!) 104/53   Pulse: 77 62 68   Resp: 20 20 20   $ Temp: 98.1 F (36.7 C) 98.1 F (36.7 C) 98.3 F (36.8 C)   TempSrc: Oral Oral Oral   SpO2: 91% 90% 92% 93%  Weight:      Height:       Supplemental O2: Nasal Cannula SpO2: 93 % O2 Flow Rate (L/min): 4 L/min FiO2 (%): 92 %   Physical Exam:  Constitutional: comfortable appearing female with [place], in no acute distress Cardiovascular: regular rate and rhythm, no m/r/g Pulmonary/Chest: normal work of breathing, decreased lung sounds most prominent in the lower lung fields, no wheezing  Abdominal: soft, non-tender, non-distended, positive bowel sounds MSK: normal bulk and tone Neurological: alert & oriented x 3, No focal deficits noted Skin: warm and dry  Filed Weights   08/05/22 1527 08/05/22 2016  Weight: 65.3 kg 63.2 kg    No intake or output data in the 24 hours ending 08/07/22 1309 Net IO Since Admission: 350 mL [08/07/22 1309]  Pertinent Labs:    Latest Ref Rng & Units 08/07/2022    7:53 AM 08/06/2022    2:13 AM 08/05/2022    3:10 PM  CBC  WBC 4.0 - 10.5 K/uL 17.1  13.3  14.0   Hemoglobin 12.0 - 15.0 g/dL 13.9  14.6  15.1   Hematocrit 36.0 - 46.0 % 42.9  45.7   47.8   Platelets 150 - 400 K/uL 458  489  657        Latest Ref Rng & Units 08/07/2022    7:53 AM 08/06/2022    2:13 AM 08/05/2022    3:10 PM  CMP  Glucose 70 - 99 mg/dL 200  397  280   BUN 8 - 23 mg/dL 11  9  8   $ Creatinine 0.44 - 1.00 mg/dL 0.46  0.56  0.47   Sodium 135 - 145 mmol/L 135  133  136   Potassium 3.5 - 5.1 mmol/L 4.1  5.1  4.6   Chloride 98 - 111 mmol/L 98  98  99   CO2 22 - 32 mmol/L 28  20  28   $ Calcium 8.9 - 10.3 mg/dL 8.9  8.7  9.7     Assessment/Plan:   Principal Problem:   Acute respiratory failure with hypoxia (HCC) Active Problems:   Diabetes mellitus, type II, insulin dependent (HCC)   HLD (hyperlipidemia)   Anxiety and depression   COPD with acute exacerbation (HCC)   Multifocal pneumonia   HTN (hypertension)   Patient Summary: Vanessa [name] is a 62 y.o. female with pertinent PMH of T2DM, COPD, TUD who recently  quit smoking, HTN, and recent COVID19 who presented with progressive DOE and cough and is admitted for acute hypoxemic respiratory failure 2/2 multifocal pneumonia, on hospital day 2   #Acute hypoxemic respiratory failure 2/2 multifocal pneumonia #Chronic obstructive pulmonary disease She improved overnight with decreased O2 requirements. Down to 4 L [place] from 6 L, not on O2 at home. Suspect that the majority of her symptoms are caused by pneumonia rather than COPD exacerbation. Will plan to hold any further systemic steroids and continue with antibiotics for a 5 day course. If she worsens or stagnates we will consider broadening for MRSA coverage given recent viral illness. - Hold further methylprednisolone (give 125 mg on 2/10 and 40 mg 2/11) - Continue IV ceftriaxone, azithromycin, day 3 - Robitussin every 4 hours  - Continue Dulera, Incruse daily - DuoNebs every 6 hours as needed - SpO2 goal 88-92%   #Type II diabetes mellitus Sugars remain elevated at 200+ but this is likely 2/2 steroids over the past 2 days. - Semglee 20u daily -  SSI  #Leukocytosis WBC up to 17 from 13 2/2 steroids, remains afebrile and no tachycardia.    #Tobacco use disorder Patient reports she quit smoking on 07/30/2022. Denies any cravings here and is not currently interested in patches. Would like to try Wellbutrin as she is worried about resuming smoking after she leaves the hospital. - start Wellbutrin 150 mg daily for 3 days, BID after that    #Hypertension - Continue home lisinopril 10 mg daily   #Anxiety #Depression - Continue home citalopram and d/c hydroxyzine as she was not taking this at home  Diet: Heart Healthy IVF: None, VTE: Enoxaparin Code: Full PT/OT recs: Pending, . Family Update: Updated in room   Dispo: Anticipated discharge to Home or rehab in 1-3 days pending PT eval and further improvement with decreased O2 requirements. Goal at least to have her on 3 L [place], preferably room air.   Johny Blamer, DO Internal Medicine Resident PGY-1 Pager: [phone]  Please contact the on call pager after 5 pm and on weekends at [phone].

## 2022-08-07 NOTE — Evaluation (Signed)
Physical Therapy Evaluation Patient Details Name: Vanessa [name] MRN: [id number] DOB: 1960-08-08 Today's Date: 08/07/2022  History of Present Illness  62 y.o. female who presents to [facility] ED 2/10 with increasing shortness of breath and cough. Found to be 83%SpO2 on RA, and CXR showed multifocal/atypical pneumonia. WBC 14.Transferred to [facility] and admitted for acute hypoxic respiratory failure.  PMH: heavy tobacco use (2 packs daily), T2DM, HTN, COPD, IBS, HTN, recent COVID on 1/21  Clinical Impression  PTA pt living with husband in mobile home with ramped entrance, home has been made handicap accessible after husband had BKA last year. Pt reports independence with house hold ambulation, sometimes requires scooter use in stores due to fatigue. Pt performs ADLs and iADLs without assist. Pt is currently limited in safe mobility due to new supplemental 4L O2 requirement in presence of decreased strength, balance and endurance. Pt requires mod I for bed mobility and transfers and min guard for ambulation in room. Pt will not have any additional PT ore equipment needs at discharge, however PT will continue to see acutely to work on balance and endurance.        Recommendations for follow up therapy are one component of a multi-disciplinary discharge planning process, led by the attending physician.  Recommendations may be updated based on patient status, additional functional criteria and insurance authorization.  Follow Up Recommendations No PT follow up         Patient can return home with the following  A little help with walking and/or transfers;A little help with bathing/dressing/bathroom;Assistance with cooking/housework;Assist for transportation;Help with stairs or ramp for entrance    Equipment Recommendations None recommended by PT  Recommendations for Other Services  OT consult (for energy conservation with ADLs)    Functional Status Assessment Patient has had a recent decline in their  functional status and demonstrates the ability to make significant improvements in function in a reasonable and predictable amount of time.     Precautions / Restrictions Precautions Precautions: Fall Precaution Comments: reports falls occasionally due to "cats under foot" Restrictions Weight Bearing Restrictions: No      Mobility  Bed Mobility Overal bed mobility: Modified Independent             General bed mobility comments: HoB eleveated and use of bed rail to pull to EoB    Transfers Overall transfer level: Modified independent Equipment used: None               General transfer comment: uses bedrail to push up to standing and steady herself    Ambulation/Gait Ambulation/Gait assistance: Min guard Gait Distance (Feet): 80 Feet Assistive device: None Gait Pattern/deviations: Step-through pattern, Decreased step length - right, Decreased step length - left Gait velocity: variable Gait velocity interpretation: <1.31 ft/sec, indicative of household ambulator   General Gait Details: increased lateral sway and occasional wider step to keep balance, no outside assist needed, min guard for safety, balance improved with distance     Balance Overall balance assessment: Needs assistance Sitting-balance support: No upper extremity supported, Feet supported, Feet unsupported Sitting balance-Leahy Scale: Good     Standing balance support: No upper extremity supported Standing balance-Leahy Scale: Fair Standing balance comment: static standing balance better than dynamic balance               High Level Balance Comments: unsteadiness with turns ambulating bed to door x5             Pertinent Vitals/Pain Pain Assessment Pain Assessment:  0-10 Pain Score: 7  Pain Location: headache Pain Descriptors / Indicators: Headache    Home Living Family/patient expects to be discharged to:: Private residence Living Arrangements: Spouse/significant  other Available Help at Discharge: Available PRN/intermittently;Family Type of Home: Mobile home Home Access: Ramped entrance       Home Layout: One level Home Equipment: Conservation officer, nature (2 wheels);Shower seat;BSC/3in1;Wheelchair - manual;Grab bars - tub/shower;Grab bars - toilet;Hand held shower head      Prior Function Prior Level of Function : Independent/Modified Independent;Needs assist             Mobility Comments: ambulates without AD, has a level of agoraphobia and does not go out often, is able to ambulate limited community distances although also reports use of scooter on occasion. ADLs Comments: independent        Extremity/Trunk Assessment   Upper Extremity Assessment Upper Extremity Assessment: Generalized weakness    Lower Extremity Assessment Lower Extremity Assessment: Generalized weakness       Communication   Communication: No difficulties  Cognition Arousal/Alertness: Awake/alert Behavior During Therapy: Anxious Overall Cognitive Status: Within Functional Limits for tasks assessed                                 General Comments: deals with agoraphobia        General Comments General comments (skin integrity, edema, etc.): SpO2 on 4L O2 via [place] with ambulation 91%O2        Assessment/Plan    PT Assessment Patient needs continued PT services  PT Problem List Decreased balance;Decreased mobility       PT Treatment Interventions Gait training;Functional mobility training;Therapeutic activities;Therapeutic exercise;Balance training;Patient/family education    PT Goals (Current goals can be found in the Care Plan section)  Acute Rehab PT Goals Patient Stated Goal: be home Sunday for her granddaughter's birthday PT Goal Formulation: With patient Time For Goal Achievement: 08/21/22 Potential to Achieve Goals: Good    Frequency Min 3X/week        AM-PAC PT "6 Clicks" Mobility  Outcome Measure Help needed turning from  your back to your side while in a flat bed without using bedrails?: None Help needed moving from lying on your back to sitting on the side of a flat bed without using bedrails?: None Help needed moving to and from a bed to a chair (including a wheelchair)?: None Help needed standing up from a chair using your arms (e.g., wheelchair or bedside chair)?: None Help needed to walk in hospital room?: A Little Help needed climbing 3-5 steps with a railing? : A Little 6 Click Score: 22    End of Session Equipment Utilized During Treatment: Gait belt;Oxygen Activity Tolerance: Patient tolerated treatment well Patient left: in bed;with call bell/phone within reach;with bed alarm set Nurse Communication: Mobility status;Patient requests pain meds PT Visit Diagnosis: Unsteadiness on feet (R26.81);Muscle weakness (generalized) (M62.81);History of falling (Z91.81)    Time: [id number] PT Time Calculation (min) (ACUTE ONLY): 17 min   Charges:   PT Evaluation $PT Eval Low Complexity: 1 Low          Loretta Doutt B. Migdalia Dk PT, DPT Acute Rehabilitation Services Please use secure chat or  Call Office [phone]   [place] 08/07/2022, 3:11 PM

## 2022-08-08 DIAGNOSIS — J9601 Acute respiratory failure with hypoxia: Secondary | ICD-10-CM | POA: Diagnosis not present

## 2022-08-08 DIAGNOSIS — J188 Other pneumonia, unspecified organism: Secondary | ICD-10-CM | POA: Diagnosis not present

## 2022-08-08 DIAGNOSIS — F17210 Nicotine dependence, cigarettes, uncomplicated: Secondary | ICD-10-CM | POA: Diagnosis not present

## 2022-08-08 DIAGNOSIS — J449 Chronic obstructive pulmonary disease, unspecified: Secondary | ICD-10-CM | POA: Diagnosis not present

## 2022-08-08 LAB — BASIC METABOLIC PANEL
Anion gap: 9 (ref 5–15)
BUN: 12 mg/dL (ref 8–23)
CO2: 27 mmol/L (ref 22–32)
Calcium: 8.5 mg/dL — ABNORMAL LOW (ref 8.9–10.3)
Chloride: 99 mmol/L (ref 98–111)
Creatinine, Ser: 0.53 mg/dL (ref 0.44–1.00)
GFR, Estimated: >60 mL/min (ref >60)
Glucose, Bld: 167 mg/dL — ABNORMAL HIGH (ref 70–99)
Potassium: 4.3 mmol/L (ref 3.5–5.1)
Sodium: 135 mmol/L (ref 135–145)

## 2022-08-08 LAB — CBC
HCT: 43.2 % (ref 36.0–46.0)
Hemoglobin: 13.9 g/dL (ref 12.0–15.0)
MCH: 28 pg (ref 26.0–34.0)
MCHC: 32.2 g/dL (ref 30.0–36.0)
MCV: 87.1 fL (ref 80.0–100.0)
Platelets: 463 10*3/uL — ABNORMAL HIGH (ref 150–400)
RBC: 4.96 MIL/uL (ref 3.87–5.11)
RDW: 13.7 % (ref 11.5–15.5)
WBC: 14.5 10*3/uL — ABNORMAL HIGH (ref 4.0–10.5)
nRBC: 0 % (ref 0.0–0.2)

## 2022-08-08 LAB — GLUCOSE, CAPILLARY
Glucose-Capillary: 97 mg/dL (ref 70–99)
Glucose-Capillary: 126 mg/dL — ABNORMAL HIGH (ref 70–99)
Glucose-Capillary: 192 mg/dL — ABNORMAL HIGH (ref 70–99)
Glucose-Capillary: 225 mg/dL — ABNORMAL HIGH (ref 70–99)

## 2022-08-08 NOTE — Progress Notes (Addendum)
Mobility Specialist Progress Note:   08/08/22 1000  Mobility  Activity Ambulated with assistance in hallway  Level of Assistance Contact guard assist, steadying assist  Assistive Device None  Distance Ambulated (ft) 200 ft  Activity Response Tolerated well  Mobility Referral Yes  $Mobility charge 1 Mobility    Pt agreeable to mobility session. Required no minG assist for safety. Pt required 4LO2 to maintain SpO2 >89%. Pt left with all needs met.     Nelta Numbers Mobility Specialist Please contact via SecureChat or  Rehab office at [phone]

## 2022-08-08 NOTE — Progress Notes (Signed)
SATURATION QUALIFICATIONS: (This note is used to comply with regulatory documentation for home oxygen)  Patient Saturations on Room Air at Rest = 88%  Patient Saturations on Room Air while Ambulating = N/A%  Patient Saturations on 4 Liters of oxygen while Ambulating = 92%  Please briefly explain why patient needs home oxygen:  [place] Specialist Please contact via SecureChat or  Rehab office at [phone]

## 2022-08-08 NOTE — Progress Notes (Signed)
HD#3 Subjective:   Summary: Vanessa [name] is a 62 y.o. female with pertinent PMH of T2DM, COPD, TUD who recently quit smoking, HTN, and recent [place] who presented with progressive DOE and cough and is admitted for acute hypoxemic respiratory failure 2/2 multifocal pneumonia.   Overnight Events: None  Patient is doing well this morning without any new or worsening symptoms.  She walked with the nurses on 2 L and had no issues.  She is also gone up to the bathroom without oxygen and denies any significant dyspnea.  Objective:  Vital signs in last 24 hours: Vitals:   08/07/22 2057 08/08/22 0518 08/08/22 0752 08/08/22 0950  BP: (!) 126/46 (!) 141/64 130/81   Pulse: 70 67 61   Resp: 20 20 19   $ Temp: 98.5 F (36.9 C) 98.3 F (36.8 C) 97.9 F (36.6 C)   TempSrc: Oral Oral Oral   SpO2: 92% 94% 93% 91%  Weight:      Height:       Supplemental O2: Nasal Cannula SpO2: 91 % O2 Flow Rate (L/min): 2 L/min FiO2 (%): 92 %   Physical Exam:  Constitutional: comfortable appearing female with [place], in no acute distress HEENT: Xanthelasmas around bilateral eyes Cardiovascular: regular rate and rhythm, no m/r/g Pulmonary/Chest: normal work of breathing, decreased lung sounds most prominent in the lower lung fields, no wheezing  Abdominal: soft, non-tender, prominent ventral hernia  MSK: normal bulk and tone Neurological: alert & oriented x 3, No focal deficits noted Skin: warm and dry  Filed Weights   08/05/22 1527 08/05/22 2016  Weight: 65.3 kg 63.2 kg    No intake or output data in the 24 hours ending 08/08/22 1437 Net IO Since Admission: 350 mL [08/08/22 1437]  Pertinent Labs:    Latest Ref Rng & Units 08/08/2022    1:48 AM 08/07/2022    7:53 AM 08/06/2022    2:13 AM  CBC  WBC 4.0 - 10.5 K/uL 14.5  17.1  13.3   Hemoglobin 12.0 - 15.0 g/dL 13.9  13.9  14.6   Hematocrit 36.0 - 46.0 % 43.2  42.9  45.7   Platelets 150 - 400 K/uL 463  458  489        Latest Ref Rng & Units  08/08/2022    1:48 AM 08/07/2022    7:53 AM 08/06/2022    2:13 AM  CMP  Glucose 70 - 99 mg/dL 167  200  397   BUN 8 - 23 mg/dL 12  11  9   $ Creatinine 0.44 - 1.00 mg/dL 0.53  0.46  0.56   Sodium 135 - 145 mmol/L 135  135  133   Potassium 3.5 - 5.1 mmol/L 4.3  4.1  5.1   Chloride 98 - 111 mmol/L 99  98  98   CO2 22 - 32 mmol/L 27  28  20   $ Calcium 8.9 - 10.3 mg/dL 8.5  8.9  8.7     Assessment/Plan:   Principal Problem:   Acute respiratory failure with hypoxia (HCC) Active Problems:   Diabetes mellitus, type II, insulin dependent (HCC)   HLD (hyperlipidemia)   Anxiety and depression   COPD with acute exacerbation (HCC)   Multifocal pneumonia   HTN (hypertension)   Patient Summary: Vanessa [name] is a 62 y.o. female with pertinent PMH of T2DM, COPD, TUD who recently quit smoking, HTN, and recent [place] who presented with progressive DOE and cough and is admitted for acute hypoxemic  respiratory failure 2/2 multifocal pneumonia, on hospital day 3   #Acute hypoxemic respiratory failure 2/2 multifocal pneumonia #Chronic obstructive pulmonary disease Down to 2 L this morning and turned out to 1 during our visit without significant drop in her sats.  Did well with ambulatory pulse ox on 2 L.  We will try to wean her to room air today with plans to discharge in the morning. - Hold further methylprednisolone (give 125 mg on 2/10 and 40 mg 2/11) - Continue IV ceftriaxone, azithromycin, day 4/5 - Robitussin every 4 hours  - Continue Dulera, Incruse daily - DuoNebs every 6 hours as needed - SpO2 goal 88-92%   #Type II diabetes mellitus Fasting blood glucose down to 160. - Semglee 20u daily - SSI  #Leukocytosis WBC down from 17-14.5 . 2/2 steroids, remains afebrile and no tachycardia.    #Tobacco use disorder Patient reports she quit smoking on 07/30/2022. Denies any cravings here and is not currently interested in patches. Would like to try Wellbutrin as she is worried about resuming  smoking after she leaves the hospital. - Wellbutrin 150 mg daily for 3 days (day 3 will be 2/14), BID after that    #Hypertension - Continue home lisinopril 10 mg daily   #Anxiety #Depression - Continue home citalopram  Diet: Heart Healthy IVF: None, VTE: Enoxaparin Code: Full PT/OT recs: No PT follow-up   Dispo: Anticipated discharge to Home  in 1 days pending continued improvement and titration down to room air.  Johny Blamer, DO Internal Medicine Resident PGY-1 Pager: [phone]  Please contact the on call pager after 5 pm and on weekends at [phone].

## 2022-08-08 NOTE — Evaluation (Addendum)
Occupational Therapy Evaluation Patient Details Name: Vanessa [name] MRN: [id number] DOB: 14-May-1961 Today's Date: 08/08/2022   History of Present Illness 62 y.o. female who presents to [facility] ED 2/10 with increasing shortness of breath and cough. Found to be 83%SpO2 on RA, and CXR showed multifocal/atypical pneumonia. WBC 14.Transferred to [facility] and admitted for acute hypoxic respiratory failure.  PMH: heavy tobacco use (2 packs daily), T2DM, HTN, COPD, IBS, HTN, recent COVID on 1/21   Clinical Impression   Pt presents with impaired endurance and strength. PTa pt lived at home with her husband and was Ind with ADLs/selfcare, IADLs and mobility. Pt currently requires Sup with ADLs and functional activities and would benefit from 1-2 more acute OT sessions for ECT (handouts have been provided by OT). O2 SATs at rest 935%on 4L O2 O2 SATs 86-91% with activity in room on 4 L O2   Recommendations for follow up therapy are one component of a multi-disciplinary discharge planning process, led by the attending physician.  Recommendations may be updated based on patient status, additional functional criteria and insurance authorization.   Follow Up Recommendations  No OT follow up     Assistance Recommended at Discharge    Patient can return home with the following A little help with bathing/dressing/bathroom;A little help with walking and/or transfers;Assistance with cooking/housework    Functional Status Assessment     Equipment Recommendations  Tub/shower seat    Recommendations for Other Services       Precautions / Restrictions Precautions Precautions: Fall Precaution Comments: reports falls occasionally due to "cats under foot" Restrictions Weight Bearing Restrictions: No      Mobility Bed Mobility Overal bed mobility: Modified Independent                  Transfers Overall transfer level: Modified independent Equipment used: None                       Balance Overall balance assessment: Needs assistance Sitting-balance support: No upper extremity supported, Feet supported, Feet unsupported Sitting balance-Leahy Scale: Good     Standing balance support: No upper extremity supported, Single extremity supported Standing balance-Leahy Scale: Fair                             ADL either performed or assessed with clinical judgement   ADL                                         General ADL Comments: supervision with LB ADLs, energy conservation education provided with handouts     Vision Ability to See in Adequate Light: 0 Adequate Patient Visual Report: No change from baseline       Perception     Praxis      Pertinent Vitals/Pain Pain Assessment Pain Assessment: No/denies pain     Hand Dominance Right   Extremity/Trunk Assessment Upper Extremity Assessment Upper Extremity Assessment: Generalized weakness   Lower Extremity Assessment Lower Extremity Assessment: Defer to PT evaluation   Cervical / Trunk Assessment Cervical / Trunk Assessment: Normal   Communication Communication Communication: No difficulties   Cognition Arousal/Alertness: Awake/alert Behavior During Therapy: WFL for tasks assessed/performed Overall Cognitive Status: Within Functional Limits for tasks assessed  General Comments       Exercises     Shoulder Instructions      Home Living Family/patient expects to be discharged to:: Private residence Living Arrangements: Spouse/significant other Available Help at Discharge: Available PRN/intermittently;Family Type of Home: Mobile home Home Access: Ramped entrance     Home Layout: One level     Bathroom Shower/Tub: Occupational psychologist: Standard Bathroom Accessibility: Yes   Home Equipment: Conservation officer, nature (2 wheels);Shower seat;BSC/3in1;Wheelchair - manual;Grab bars - tub/shower;Grab bars  - toilet;Hand held shower head          Prior Functioning/Environment Prior Level of Function : Independent/Modified Independent;Needs assist             Mobility Comments: ambulates without AD, has a level of agoraphobia and does not go out often, is able to ambulate limited community distances although also reports use of scooter on occasion. ADLs Comments: independent        OT Problem List: Decreased activity tolerance;Decreased knowledge of use of DME or AE      OT Treatment/Interventions: Self-care/ADL training;Energy conservation;Therapeutic activities;Patient/family education    OT Goals(Current goals can be found in the care plan section) Acute Rehab OT Goals Patient Stated Goal: go home today OT Goal Formulation: With patient Time For Goal Achievement: 08/22/22 Potential to Achieve Goals: Good  OT Frequency: Min 2X/week    Co-evaluation              AM-PAC OT "6 Clicks" Daily Activity     Outcome Measure Help from another person eating meals?: None Help from another person taking care of personal grooming?: None Help from another person toileting, which includes using toliet, bedpan, or urinal?: A Little Help from another person bathing (including washing, rinsing, drying)?: A Little Help from another person to put on and taking off regular upper body clothing?: None Help from another person to put on and taking off regular lower body clothing?: A Little 6 Click Score: 21   End of Session Equipment Utilized During Treatment: Oxygen  Activity Tolerance: Patient tolerated treatment well Patient left: in bed (seated EOB)  OT Visit Diagnosis: Unsteadiness on feet (R26.81);Muscle weakness (generalized) (M62.81)                Time: [id number] OT Time Calculation (min): 26 min Charges:  OT General Charges $OT Visit: 1 Visit OT Evaluation $OT Eval Low Complexity: 1 Low OT Treatments $Therapeutic Activity: 8-22 mins   Britt Bottom 08/08/2022, 12:51 PM

## 2022-08-09 ENCOUNTER — Telehealth: Payer: Self-pay

## 2022-08-09 ENCOUNTER — Encounter: Payer: Self-pay | Admitting: Hematology and Oncology

## 2022-08-09 DIAGNOSIS — J9601 Acute respiratory failure with hypoxia: Secondary | ICD-10-CM | POA: Diagnosis not present

## 2022-08-09 DIAGNOSIS — F17210 Nicotine dependence, cigarettes, uncomplicated: Secondary | ICD-10-CM | POA: Diagnosis not present

## 2022-08-09 LAB — GLUCOSE, CAPILLARY
Glucose-Capillary: 77 mg/dL (ref 70–99)
Glucose-Capillary: 124 mg/dL — ABNORMAL HIGH (ref 70–99)

## 2022-08-09 MED ORDER — GUAIFENESIN 100 MG/5ML PO LIQD: 5.0000 mL | ORAL | 0 refills | Status: DC

## 2022-08-09 MED ORDER — BUPROPION HCL ER (SR) 150 MG PO TB12: 150.0000 mg | ORAL_TABLET | Freq: Two times a day (BID) | ORAL | 0 refills | Status: DC

## 2022-08-09 MED ORDER — PEN NEEDLES 31G X 5 MM MISC: 1.0000 | Freq: Every day | 0 refills | Status: AC

## 2022-08-09 MED ORDER — FAMOTIDINE 20 MG PO TABS: 20.0000 mg | ORAL_TABLET | Freq: Two times a day (BID) | ORAL | 3 refills | Status: DC

## 2022-08-09 NOTE — Telephone Encounter (Signed)
Pa for pt ( FAMOTIDINE 20 MG TAB )  came through on cover my meds was submitted with  hospital  notes and labs .Marland Kitchen Awaiting approval or denial

## 2022-08-09 NOTE — Care Management (Signed)
  Transition of Care [facility]) Screening Note   Patient Details  Name: Vanessa [name] Date of Birth: 1960/08/28   Transition of [facility]) CM/SW Contact:    Bethena Roys, RN Phone Number: 08/09/2022, 11:50 AM    Transition of Care Department [facility]) has reviewed the patient. Patient presented for Acute respiratory failure with hypoxia. Patient has support of daughter. Case Manager discussed oxygen DME company with the daughter and she did not have a preference. Agreeable to [employer]- referral made and the DME will be delivered to the room prior to transition home. No further home needs identified at this time.

## 2022-08-09 NOTE — Telephone Encounter (Signed)
DECISION ;    DENIED      Why was my request denied?   This request was denied because you did not meet the following requirements:   The requested medication and/or diagnosis are not a covered benefit and are excluded from coverage in accordance with the terms and conditions of your plan benefit. Therefore, this request has been administratively denied.       ( PRESCRIBER  AND PCP  NOTIFIED  AND COPY PLACED TO SCANNED  TO CHART )

## 2022-08-09 NOTE — Discharge Summary (Signed)
Name: Vanessa [name] MRN: [id number] DOB: 04-11-1961 62 y.o. PCP: Sanjuan Dame, MD  Date of Admission: 08/05/2022  2:43 PM Date of Discharge: 08/09/2022 Attending Physician: Dr. Jimmye Norman  Discharge Diagnosis: Principal Problem:   Acute respiratory failure with hypoxia [facility]) Active Problems:   Diabetes mellitus, type II, insulin dependent (Vanessa [name])   HLD (hyperlipidemia)   Anxiety and depression   COPD with acute exacerbation (Vanessa [name])   Multifocal pneumonia   HTN (hypertension)    Discharge Medications: Allergies as of 08/09/2022       Reactions   Jardiance [empagliflozin] Other (See Comments)   Severe yeast infection   Latex Rash   Avandia [rosiglitazone] Other (See Comments)   Headaches        Medication List     STOP taking these medications    cimetidine 200 MG tablet Commonly known as: TAGAMET Replaced by: famotidine 20 MG tablet       TAKE these medications    acetaminophen 500 MG tablet Commonly known as: TYLENOL Take 1,000 mg by mouth 2 (two) times daily as needed for mild pain or moderate pain.   Advair Diskus 250-50 MCG/ACT Aepb Generic drug: fluticasone-salmeterol INHALE 1 INHALATION BY  MOUTH INTO THE LUNGS IN THE MORNING AND AT BEDTIME What changed: See the new instructions.   albuterol 108 (90 Base) MCG/ACT inhaler Commonly known as: VENTOLIN HFA INHALE 1 TO 2 PUFFS INTO LUNGS EVERY 6 HOURS AS NEEDED FOR WHEEZING FOR SHORTNESS OF BREATH What changed:  how much to take how to take this when to take this reasons to take this additional instructions   atorvastatin 40 MG tablet Commonly known as: LIPITOR Take 1 tablet (40 mg total) by mouth daily.   buPROPion 150 MG 12 hr tablet Commonly known as: WELLBUTRIN SR Take 1 tablet (150 mg total) by mouth 2 (two) times daily. Start taking on: August 10, 2022   cetirizine 10 MG tablet Commonly known as: ZYRTEC Take 1 tablet (10 mg total) by mouth daily.   citalopram 40 MG tablet Commonly  known as: CELEXA Take 1 tablet (40 mg total) by mouth daily.   famotidine 20 MG tablet Commonly known as: PEPCID Take 1 tablet (20 mg total) by mouth 2 (two) times daily. Replaces: cimetidine 200 MG tablet   glucose blood test strip Use as instructed   guaiFENesin 100 MG/5ML liquid Commonly known as: ROBITUSSIN Take 5 mLs by mouth every 4 (four) hours.   lisinopril 10 MG tablet Commonly known as: ZESTRIL Take 1 tablet (10 mg total) by mouth daily.   Pen Needles 31G X 5 MM Misc 1 each by Does not apply route daily. Use to inject insulin   Soliqua 100-33 UNT-MCG/ML Sopn Generic drug: Insulin Glargine-Lixisenatide Inject 20 Units into the skin daily.   Spiriva HandiHaler 18 MCG inhalation capsule Generic drug: tiotropium PLACE ONE CAPSULE INTO INHALER AND INHALE DAILY What changed:  how much to take how to take this when to take this additional instructions               Durable Medical Equipment  (From admission, onward)           Start     Ordered   08/09/22 1207  DME Oxygen  Once       Comments: Use 2-3 L O2 with [place] as needed with target spO2 of 90%. Wean as tolerated.  Question Answer Comment  Length of Need 6 Months   Mode or (Route) Nasal cannula  Liters per Minute 2   Oxygen delivery system Gas      08/09/22 1206            Disposition and follow-up:   [name] was discharged from [facility] in Good condition.  At the hospital follow up visit please address:  1.  Follow-up:  a.  Improvement or discontinuation of supplemental oxygen after discharge.  She had not previously been on oxygen.  Also ensure that she has her inhalers and she is taking them as directed.    b.  Patient be due for an A1c in the next month after she was started on Soliqua.   c.  Reevaluate tobacco cessation as she quit on 07/30/2022 and was started on Wellbutrin here.  2.  Labs / imaging needed at time of follow-up: None but consider an  A1c  3.  Pending labs/ test needing follow-up: None   Follow-up Appointments:  Follow-up Information     [facility] Follow up.   Why: Oxygen to be delivered to the room Contact information: [address] [place] [address] [phone]                Follow-up at the [facility] with Dr. Collene Gobble on 08/15/2022 8:45 AM.  Hospital Course by problem list: Vanessa [name] is a 62 y.o. female with pertinent PMH of T2DM, COPD, TUD who recently quit smoking, HTN, and recent [place] who presented with progressive DOE and cough and is admitted for acute hypoxemic respiratory failure 2/2 multifocal pneumonia.     #Acute hypoxemic respiratory failure 2/2 multifocal pneumonia #Chronic obstructive pulmonary disease Presented with oxygen saturation down to 83% on room air and required 5 L nasal cannula.  She was initially treated for COPD exacerbation with steroids and antibiotics.  It was found that she had not been taking her inhalers at home due to not having them.  Steroids were subsequently stopped and she was given 5 days of ceftriaxone and azithromycin as well as Dulera and Incruse.  We were able to get her down to 2 L at rest and 3 L with activity prior to discharge.  She was discharged with home oxygen with plans to wean as tolerated.    #Type II diabetes mellitus She was recently started on Soliqua 20 units and has been doing well on this.  She was given Semglee 20 units daily here and sliding scale insulin.  Although her sugars were high after receiving IV steroids they did level out and she was discharged home to resume Soliqua 20 units daily.   #Leukocytosis After receiving steroids patient developed a leukocytosis up to 17 without fevers.  This trended down prior to discharge and after discontinuing steroids.    #Tobacco use disorder Patient reports she quit smoking on 07/30/2022.  She was started on Wellbutrin here, did well on this.  She will continue this after discharge  and we congratulated her on her continued tobacco cessation.  #Hypertension Continued home lisinopril 10 mg daily   #Anxiety #Depression Continued home citalopram   Discharge Subjective: Patient is doing well this morning but is frustrated that she is not unable to get off the oxygen completely.  She denies any new or worsening issues and is not having any trouble breathing on 2 L at rest.  She is ready to go home.  Discharge Exam:   BP (!) 145/57 (BP Location: Right Arm)   Pulse 68   Temp 98.5 F (36.9 C) (  Oral)   Resp 16   Ht 5' 2"$  (1.575 m)   Wt 63.2 kg   LMP 12/24/2013   SpO2 92%   BMI 25.48 kg/m  Constitutional: comfortable appearing female with [place], in no acute distress HEENT: Xanthelasmas around bilateral eyes Cardiovascular: regular rate and rhythm, no m/r/g Pulmonary/Chest: normal work of breathing, mildly decreased lung sounds most prominent in the lower lung fields, no wheezing  Abdominal: soft, non-tender, prominent ventral hernia  MSK: normal bulk and tone Neurological: alert & oriented x 3, No focal deficits noted Skin: warm and dry  Pertinent Labs, Studies, and Procedures:     Latest Ref Rng & Units 08/08/2022    1:48 AM 08/07/2022    7:53 AM 08/06/2022    2:13 AM  CBC  WBC 4.0 - 10.5 K/uL 14.5  17.1  13.3   Hemoglobin 12.0 - 15.0 g/dL 13.9  13.9  14.6   Hematocrit 36.0 - 46.0 % 43.2  42.9  45.7   Platelets 150 - 400 K/uL 463  458  489        Latest Ref Rng & Units 08/08/2022    1:48 AM 08/07/2022    7:53 AM 08/06/2022    2:13 AM  CMP  Glucose 70 - 99 mg/dL 167  200  397   BUN 8 - 23 mg/dL 12  11  9   $ Creatinine 0.44 - 1.00 mg/dL 0.53  0.46  0.56   Sodium 135 - 145 mmol/L 135  135  133   Potassium 3.5 - 5.1 mmol/L 4.3  4.1  5.1   Chloride 98 - 111 mmol/L 99  98  98   CO2 22 - 32 mmol/L 27  28  20   $ Calcium 8.9 - 10.3 mg/dL 8.5  8.9  8.7     DG Chest Port 1 View  Result Date: 08/05/2022 CLINICAL DATA:  Shortness of breath. COVID positive 1/21  with progressive symptoms. Cough and congestion. EXAM: PORTABLE CHEST 1 VIEW COMPARISON:  Chest radiograph 03/10/2018 FINDINGS: Heart is upper normal in size. Normal mediastinal contours with mild aortic atherosclerosis. There are ill-defined opacities in the lung bases, left greater than right. Background interstitial and bronchial thickening which appears chronic. Mild chronic hyperinflation. Blunting of the costophrenic angles is favored to be due to hyperinflation rather than pleural effusions. No pneumothorax. Suspect small bilateral cervical ribs. IMPRESSION: 1. Ill-defined opacities in the lung bases, left greater than right, suspicious for multifocal pneumonia, including COVID-19. 2. Background interstitial and bronchial thickening which appears chronic. 3. Borderline cardiomegaly. Electronically Signed   By: Keith Rake M.D.   On: 08/05/2022 15:30     Discharge Instructions: Discharge Instructions     Call MD for:  difficulty breathing, headache or visual disturbances   Complete by: As directed    Call MD for:  extreme fatigue   Complete by: As directed    Call MD for:  persistant dizziness or light-headedness   Complete by: As directed    Call MD for:  persistant nausea and vomiting   Complete by: As directed    Call MD for:  severe uncontrolled pain   Complete by: As directed    Call MD for:  temperature >100.4   Complete by: As directed    Diet - low sodium heart healthy   Complete by: As directed    Increase activity slowly   Complete by: As directed        Signed: Johny Blamer, DO 08/09/2022, 4:51 PM  Pager: [phone]

## 2022-08-09 NOTE — Progress Notes (Signed)
SATURATION QUALIFICATIONS: (This note is used to comply with regulatory documentation for home oxygen)  Patient Saturations on Room Air at Rest = 88%  Patient Saturations on Room Air while Ambulating = N/A%  Patient Saturations on 3 Liters of oxygen while Ambulating = 90%  Please briefly explain why patient needs home oxygen:  [place] Specialist Please contact via SecureChat or  Rehab office at [phone]

## 2022-08-09 NOTE — Progress Notes (Addendum)
Received order to ambulate patient on day shift with 2L oxygen with pulse ox then inform Johny Blamer, DO once the procedure is done. For possible DC today.

## 2022-08-09 NOTE — Progress Notes (Signed)
Occupational Therapy Treatment Patient Details Name: Vanessa [name] MRN: [id number] DOB: [birth year] Today's Date: 08/09/2022   History of present illness 62 y.o. female who presents to [facility] ED 2/10 with increasing shortness of breath and cough. Found to be 83%SpO2 on RA, and CXR showed multifocal/atypical pneumonia. WBC 14.Transferred to [facility] and admitted for acute hypoxic respiratory failure.  PMH: heavy tobacco use (2 packs daily), T2DM, HTN, COPD, IBS, HTN, recent COVID on 1/21   OT comments  Pt has made good progress with all adls and is at mod I level with all adls. Pt has all necessary equipment at home and has been educated in energy conservation techniques for d/c. Pt has met both goals. Pt on 2L O2 on arrival with O2 sats at 91%.  If pt does any activity in room at all, O2 must be bumped to 3L to maintain O2 sats over 90%.  Will d/c OT at this time.  All goals met and pt does not want Ot services any longer.   Recommendations for follow up therapy are one component of a multi-disciplinary discharge planning process, led by the attending physician.  Recommendations may be updated based on patient status, additional functional criteria and insurance authorization.    Follow Up Recommendations  No OT follow up     Assistance Recommended at Discharge Intermittent Supervision/Assistance  Patient can return home with the following  Assistance with cooking/housework   Equipment Recommendations  Other (comment) (pt has built in shower chair)    Recommendations for Other Services      Precautions / Restrictions Precautions Precautions: Other (comment) (watch O2 sats) Restrictions Weight Bearing Restrictions: No       Mobility Bed Mobility Overal bed mobility: Modified Independent             General bed mobility comments: HOB elevated    Transfers Overall transfer level: Modified independent Equipment used: None               General transfer comment: uses  bedrail to push up to standing and steady herself     Balance Overall balance assessment: No apparent balance deficits (not formally assessed)                                         ADL either performed or assessed with clinical judgement   ADL Overall ADL's : Modified independent                                       General ADL Comments: Pt overall mod I with all basic adls in her room. Assist only given to manage O2 line. Pt O2 at 91% on 2 L O2 in room. Daughter states her baseline is 92% on RA.  Pt got up to do mobility and dropped. Pt able to maintain over 90% if on 3L today.    Extremity/Trunk Assessment Upper Extremity Assessment Upper Extremity Assessment: Overall WFL for tasks assessed   Lower Extremity Assessment Lower Extremity Assessment: Defer to PT evaluation        Vision   Vision Assessment?: No apparent visual deficits   Perception Perception Perception: Within Functional Limits   Praxis      Cognition Arousal/Alertness: Awake/alert Behavior During Therapy: WFL for tasks assessed/performed Overall Cognitive Status: Within Functional Limits for tasks  assessed                                          Exercises      Shoulder Instructions       General Comments Pt is independent with all basic adls but does continue to require 3L of O2 durin activity.  Pt given energy consevation hand out and has all necessary equipment at home.  Reviewed all things pt could do at home to attempt to conserve energy to better manage her O2 sats after d/c.    Pertinent Vitals/ Pain       Pain Assessment Pain Assessment: No/denies pain Pain Score: 0-No pain  Home Living                                          Prior Functioning/Environment              Frequency  Min 2X/week        Progress Toward Goals  OT Goals(current goals can now be found in the care plan section)  Progress  towards OT goals: Goals met/education completed, patient discharged from OT  Acute Rehab OT Goals Patient Stated Goal: to go home today OT Goal Formulation: With patient Time For Goal Achievement: 08/22/22 Potential to Achieve Goals: Good ADL Goals Additional ADL Goal #1: Pt will walk to bathroom and complete all toileting with mod I Additional ADL Goal #2: Pt will state 3 things she can do at home to conserve energy during adls with no assist.  Plan Discharge plan remains appropriate    Co-evaluation                 AM-PAC OT "6 Clicks" Daily Activity     Outcome Measure   Help from another person eating meals?: None Help from another person taking care of personal grooming?: None Help from another person toileting, which includes using toliet, bedpan, or urinal?: None Help from another person bathing (including washing, rinsing, drying)?: None Help from another person to put on and taking off regular upper body clothing?: None Help from another person to put on and taking off regular lower body clothing?: None 6 Click Score: 24    End of Session Equipment Utilized During Treatment: Oxygen  OT Visit Diagnosis: Unsteadiness on feet (R26.81);Muscle weakness (generalized) (M62.81)   Activity Tolerance Patient tolerated treatment well   Patient Left in bed   Nurse Communication Other (comment) (O2 sats at 90% or above when on 3 L during activity.)        Time: [id number] OT Time Calculation (min): 11 min  Charges: OT General Charges $OT Visit: 1 Visit OT Treatments $Self Care/Home Management : 8-22 mins   Glenford Peers 08/09/2022, 9:36 AM

## 2022-08-09 NOTE — Discharge Instructions (Addendum)
Vanessa [name],  You were recently admitted to [facility] for difficulty breathing.   Continue taking your home medications with the following changes  Start taking Wellbutrin 150 mg twice daily   You should seek further medical care if your breathing gets worse, you have worsening cough, fevers, or any other symptoms that worry you.  We recommend that you see your primary care doctor in about a week to make sure that you continue to improve. We are so glad that you are feeling better.  Sincerely, Johny Blamer, DO

## 2022-08-09 NOTE — Plan of Care (Signed)
  Problem: Education: Goal: Knowledge of General Education information will improve Description: Including pain rating scale, medication(s)/side effects and non-pharmacologic comfort measures Outcome: Progressing   Problem: Health Behavior/Discharge Planning: Goal: Ability to manage health-related needs will improve Outcome: Progressing   Problem: Clinical Measurements: Goal: Respiratory complications will improve Outcome: Progressing   Problem: Clinical Measurements: Goal: Cardiovascular complication will be avoided Outcome: Progressing   Problem: Safety: Goal: Ability to remain free from injury will improve Outcome: Progressing   Problem: Pain Managment: Goal: General experience of comfort will improve Outcome: Progressing   Problem: Skin Integrity: Goal: Risk for impaired skin integrity will decrease Outcome: Progressing

## 2022-08-09 NOTE — Plan of Care (Signed)
All goals met. 

## 2022-08-09 NOTE — Progress Notes (Addendum)
Mobility Specialist Progress Note:   08/09/22 0905  Mobility  Activity Ambulated with assistance in hallway  Level of Assistance Standby assist, set-up cues, supervision of patient - no hands on  Assistive Device None  Distance Ambulated (ft) 300 ft  Activity Response Tolerated well  Mobility Referral Yes  $Mobility charge 1 Mobility   Pt agreeable to mobility session. Required no physical assist, only supervision for safety. SpO2 90% on 3LO2. Pt back in bed with all needs met, back on 2LO2.  Nelta Numbers Mobility Specialist Please contact via SecureChat or  Rehab office at [phone]

## 2022-08-10 ENCOUNTER — Telehealth: Payer: Self-pay

## 2022-08-10 NOTE — Transitions of Care (Post Inpatient/ED Visit) (Signed)
   08/10/2022  Name: Vanessa [name] MRN: [id number] DOB: [birth year]  Today's TOC FU Call Status: Today's TOC FU Call Status:: Successful TOC FU Call Competed TOC FU Call Complete Date: 08/10/22  Transition Care Management Follow-up Telephone Call Date of Discharge: 08/09/22 Discharge Facility: [facility] Type of Discharge: Inpatient Admission Primary Inpatient Discharge Diagnosis:: pneumonia How have you been since you were released from the hospital?: Better Any questions or concerns?: No  Items Reviewed: Did you receive and understand the discharge instructions provided?: Yes Medications obtained and verified?: Yes (Medications Reviewed) Any new allergies since your discharge?: No Dietary orders reviewed?: NA Do you have support at home?: Yes People in Home: spouse  Home Care and Equipment/Supplies: [place] Ordered?: NA Any new equipment or medical supplies ordered?: Yes Name of Medical supply agency?: [facility] Were you able to get the equipment/medical supplies?: Yes Do you have any questions related to the use of the equipment/supplies?: No  Functional Questionnaire: Do you need assistance with bathing/showering or dressing?: No Do you need assistance with meal preparation?: No Do you need assistance with eating?: No Do you have difficulty maintaining continence: No Do you need assistance with getting out of bed/getting out of a chair/moving?: No Do you have difficulty managing or taking your medications?: No  Folllow up appointments reviewed: PCP Follow-up appointment confirmed?: Yes Date of PCP follow-up appointment?: 08/15/22 Follow-up Provider: [facility] Follow-up appointment confirmed?: NA Do you need transportation to your follow-up appointment?: No Do you understand care options if your condition(s) worsen?: Yes-patient verbalized understanding    Page, [place] Nurse Health Advisor Direct Dial [phone]

## 2022-08-11 ENCOUNTER — Ambulatory Visit: Payer: 59 | Admitting: Hematology and Oncology

## 2022-08-11 ENCOUNTER — Other Ambulatory Visit: Payer: 59

## 2022-08-11 ENCOUNTER — Ambulatory Visit: Payer: 59

## 2022-08-14 ENCOUNTER — Other Ambulatory Visit: Payer: Self-pay | Admitting: Student

## 2022-08-14 DIAGNOSIS — J449 Chronic obstructive pulmonary disease, unspecified: Secondary | ICD-10-CM

## 2022-08-15 ENCOUNTER — Encounter: Payer: 59 | Admitting: Student

## 2022-08-22 ENCOUNTER — Encounter: Payer: 59 | Admitting: Student

## 2022-08-30 ENCOUNTER — Encounter: Payer: 59 | Admitting: Student

## 2022-09-18 ENCOUNTER — Other Ambulatory Visit: Payer: Self-pay | Admitting: Student

## 2022-09-18 DIAGNOSIS — J449 Chronic obstructive pulmonary disease, unspecified: Secondary | ICD-10-CM

## 2022-09-18 MED ORDER — ALBUTEROL SULFATE HFA 108 (90 BASE) MCG/ACT IN AERS: 2.0000 | INHALATION_SPRAY | RESPIRATORY_TRACT | 2 refills | Status: DC | PRN

## 2022-09-19 ENCOUNTER — Telehealth: Payer: Self-pay

## 2022-09-19 NOTE — Telephone Encounter (Signed)
Incoming fax from pharmacy  Medication:Albuterol HFA 34 MCG  Message to prescriber" Please indicate how often patient is able to use. Need to bill insurance.Thanks!"

## 2022-09-20 MED ORDER — ALBUTEROL SULFATE HFA 108 (90 BASE) MCG/ACT IN AERS: 2.0000 | INHALATION_SPRAY | Freq: Four times a day (QID) | RESPIRATORY_TRACT | 2 refills | Status: DC | PRN

## 2022-09-20 NOTE — Addendum Note (Signed)
Addended bySanjuan Dame on: 09/20/2022 11:02 AM   Modules accepted: Orders

## 2022-09-28 ENCOUNTER — Other Ambulatory Visit: Payer: Self-pay | Admitting: Student

## 2022-10-05 ENCOUNTER — Encounter: Payer: 59 | Admitting: Student

## 2022-10-17 ENCOUNTER — Encounter: Payer: 59 | Admitting: Internal Medicine

## 2022-10-25 ENCOUNTER — Encounter: Payer: Self-pay | Admitting: Internal Medicine

## 2022-10-25 ENCOUNTER — Ambulatory Visit (INDEPENDENT_AMBULATORY_CARE_PROVIDER_SITE_OTHER): Payer: 59 | Admitting: Internal Medicine

## 2022-10-25 ENCOUNTER — Other Ambulatory Visit: Payer: Self-pay

## 2022-10-25 VITALS — BP 127/62 | HR 106 | Temp 98.6°F | Resp 32 | Ht 62.0 in | Wt 145.4 lb

## 2022-10-25 DIAGNOSIS — R300 Dysuria: Secondary | ICD-10-CM

## 2022-10-25 DIAGNOSIS — Z794 Long term (current) use of insulin: Secondary | ICD-10-CM | POA: Diagnosis not present

## 2022-10-25 DIAGNOSIS — E119 Type 2 diabetes mellitus without complications: Secondary | ICD-10-CM

## 2022-10-25 DIAGNOSIS — E785 Hyperlipidemia, unspecified: Secondary | ICD-10-CM

## 2022-10-25 LAB — POCT URINALYSIS DIPSTICK
Bilirubin, UA: NEGATIVE
Glucose, UA: NEGATIVE
Ketones, UA: NEGATIVE
Nitrite, UA: NEGATIVE
Protein, UA: POSITIVE — AB
Spec Grav, UA: 1.01 (ref 1.010–1.025)
Urobilinogen, UA: 0.2 E.U./dL
pH, UA: 6 (ref 5.0–8.0)

## 2022-10-25 LAB — GLUCOSE, CAPILLARY: Glucose-Capillary: 197 mg/dL — ABNORMAL HIGH (ref 70–99)

## 2022-10-25 LAB — POCT GLYCOSYLATED HEMOGLOBIN (HGB A1C): Hemoglobin A1C: 10.6 % — AB (ref 4.0–5.6)

## 2022-10-25 MED ORDER — SULFAMETHOXAZOLE-TRIMETHOPRIM 800-160 MG PO TABS: 1.0000 | ORAL_TABLET | Freq: Two times a day (BID) | ORAL | 0 refills | Status: AC

## 2022-10-25 MED ORDER — DULERA 100-5 MCG/ACT IN AERO: 2.0000 | INHALATION_SPRAY | Freq: Every day | RESPIRATORY_TRACT | 2 refills | Status: DC | PRN

## 2022-10-25 MED ORDER — ATORVASTATIN CALCIUM 80 MG PO TABS
40.0000 mg | ORAL_TABLET | Freq: Every day | ORAL | 2 refills | Status: DC
Start: 2022-10-25 — End: 2022-11-02

## 2022-10-25 MED ORDER — BUPROPION HCL ER (SR) 150 MG PO TB12: 150.0000 mg | ORAL_TABLET | Freq: Two times a day (BID) | ORAL | 2 refills | Status: DC

## 2022-10-25 NOTE — Progress Notes (Signed)
Discussed urine findings with patient. We will treat with bactrim 3 days.  A1c elevated 10.6. I have discussed this with patient and encouraged she up-titrate her soliquia as able. Instructions provided.

## 2022-10-25 NOTE — Patient Instructions (Signed)
Dear Mrs. Vanessa [name],  Thank you for trusting [place] with your care.   Please continue to increase the [facility] as able depending on your blood sugar checks. Ideally, the blood sugar check in the morning before food should be 80-120. If your blood sugars are above this goal, please increase by 2 units. If you are well above this level 200-300, you may increase by 4 units each week.  We will increase your cholesterol medicine to 80 mg and recheck in 3 months.  Please take the bactrim twice daily for 3 days. I have refilled the wellbutrin to help you stop smoking.  I have refilled your dulera. Please return in 3 months.

## 2022-10-25 NOTE — Progress Notes (Signed)
NA

## 2022-10-26 ENCOUNTER — Telehealth: Payer: Self-pay

## 2022-10-26 LAB — URINALYSIS, COMPLETE
Bilirubin, UA: NEGATIVE
Glucose, UA: NEGATIVE
Ketones, UA: NEGATIVE
Nitrite, UA: NEGATIVE
RBC, UA: NEGATIVE
Specific Gravity, UA: 1.007 (ref 1.005–1.030)
Urobilinogen, Ur: 0.2 mg/dL (ref 0.2–1.0)
pH, UA: 6.5 (ref 5.0–7.5)

## 2022-10-26 LAB — MICROSCOPIC EXAMINATION
Casts: NONE SEEN /lpf
WBC, UA: >30 /hpf — AB (ref 0–5)

## 2022-10-26 NOTE — Telephone Encounter (Signed)
Decision:Denied Plan member ID: [id number] Case number: [id number] Prescriber name: Vanessa [name] Prescriber fax: [phone] NOTICE OF DENIAL Dear Vanessa [name], On behalf of [employer], [employer] is responsible for reviewing pharmacy services provided to [name]. We received a request from your prescriber for coverage of Dulera Aer 100-73mcg. We reviewed all of the information you and/or your doctor sent to [place] and sent the information to an appropriate physician specialist if needed. Unfortunately, we must deny coverage for Vanessa [name]. Why was my request denied? This request was denied because you did not meet the following requirements: The requested medication and/or diagnosis are not a covered benefit and excluded from coverage in accordance with the terms and conditions of your plan benefit. Therefore, the request has been administratively denied. If applicable, below are some alternative medications to consider: SYMBICORT

## 2022-10-26 NOTE — Telephone Encounter (Signed)
Prior Authorization for patient (Dulera) came through on cover my meds was submitted with last office notes awaiting approval or denial 

## 2022-10-27 LAB — URINE CULTURE

## 2022-10-30 MED ORDER — BUDESONIDE-FORMOTEROL FUMARATE 80-4.5 MCG/ACT IN AERO: 2.0000 | INHALATION_SPRAY | Freq: Two times a day (BID) | RESPIRATORY_TRACT | 12 refills | Status: DC

## 2022-10-30 NOTE — Addendum Note (Signed)
Addended by: Rocky Morel on: 10/30/2022 10:01 AM   Modules accepted: Orders

## 2022-11-02 MED ORDER — ATORVASTATIN CALCIUM 80 MG PO TABS
80.0000 mg | ORAL_TABLET | Freq: Every day | ORAL | 2 refills | Status: DC
Start: 2022-11-02 — End: 2023-12-13

## 2022-11-02 NOTE — Progress Notes (Signed)
I discussed the care of Ms. Vantassell with Dr. Burnice Logan but he is unable to document note.  No charge encounter.  Her A1c is uncontrolled and we have opted to continue to uptitrate her Soliqua and given her instructions to increase by 2 to 4 units based on fasting blood glucose may change once a week.  She is experiencing some UTI symptoms and received a 3 day course of bactrim.  Statin intensity increased to 80mg  daily of atorvastatin.  [place] and [facility] refilled.

## 2022-11-10 ENCOUNTER — Encounter: Payer: Self-pay | Admitting: Hematology and Oncology

## 2022-11-19 ENCOUNTER — Other Ambulatory Visit: Payer: Self-pay | Admitting: Student

## 2022-11-19 DIAGNOSIS — J449 Chronic obstructive pulmonary disease, unspecified: Secondary | ICD-10-CM

## 2022-11-21 ENCOUNTER — Telehealth: Payer: Self-pay | Admitting: *Deleted

## 2022-11-21 ENCOUNTER — Ambulatory Visit (INDEPENDENT_AMBULATORY_CARE_PROVIDER_SITE_OTHER): Payer: 59

## 2022-11-21 ENCOUNTER — Ambulatory Visit: Payer: 59 | Admitting: Podiatry

## 2022-11-21 DIAGNOSIS — M86071 Acute hematogenous osteomyelitis, right ankle and foot: Secondary | ICD-10-CM | POA: Diagnosis not present

## 2022-11-21 DIAGNOSIS — L97512 Non-pressure chronic ulcer of other part of right foot with fat layer exposed: Secondary | ICD-10-CM

## 2022-11-21 DIAGNOSIS — L97519 Non-pressure chronic ulcer of other part of right foot with unspecified severity: Secondary | ICD-10-CM

## 2022-11-21 MED ORDER — AMOXICILLIN-POT CLAVULANATE 875-125 MG PO TABS: 1.0000 | ORAL_TABLET | Freq: Two times a day (BID) | ORAL | 0 refills | Status: DC

## 2022-11-21 NOTE — Patient Instructions (Signed)
Go to the Emergency Room due to the infection in the right foot

## 2022-11-21 NOTE — Telephone Encounter (Signed)
Pt is requesting her med ... It was sent ouver since 5/26

## 2022-11-21 NOTE — Telephone Encounter (Signed)
Patient was told to go to ER for infected toe, could not go today because of getting her husband's medications taken care for him but will go in the morning.

## 2022-11-21 NOTE — Progress Notes (Signed)
Subjective:   Patient ID: Vanessa [name], female   DOB: 62 y.o.   MRN: [id number]   HPI Chief Complaint  Patient presents with   Foot Ulcer    Rm 12 Right foot ulcer near 5th met x 1 week. Pain with broken skin, edema, drainage and bleeding. Pt is diabetic. Last A1c 10.6 29/3226   62 year old female presents the office due to bunions.  She states that she pulled a piece of broken skin off a couple weeks ago and since then the wound is progressing and getting worse.  She does not report any fevers or chills that she has seen swelling.   Review of Systems  All other systems reviewed and are negative.  Past Medical History:  Diagnosis Date   Asthma    No PFT performed   Blood transfusion without reported diagnosis    has to donate blood due to having "thick" blood.   COPD (chronic obstructive pulmonary disease) (HCC)    Depression    Diabetes mellitus 2002   GERD (gastroesophageal reflux disease)    Helicobacter pylori (H. pylori) infection    s/p triple therapy   Hepatic hemangioma    History of kidney stones    Hyperlipidemia    Hypertension    Hypertriglyceridemia    Panic attacks    mostly Agaraphobia   Pneumonia    Ventral hernia     Past Surgical History:  Procedure Laterality Date   CESAREAN SECTION     with 3 children   COLONOSCOPY  2018   OVARIAN CYST REMOVAL     Age 49   ROBOTIC ASSISTED BILATERAL SALPINGO OOPHERECTOMY Bilateral 11/23/2021   Procedure: XI ROBOTIC ASSISTED BILATERAL SALPINGO OOPHORECTOMY,  MINI LAPAROTOMY;  Surgeon: Carver Fila, MD;  Location: [facility];  Service: Gynecology;  Laterality: Bilateral;   ROBOTIC ASSISTED TOTAL HYSTERECTOMY N/A 11/23/2021   Procedure: XI ROBOTIC ASSISTED TOTAL HYSTERECTOMY, STAGING, CYSTOSCOPY;  Surgeon: Carver Fila, MD;  Location: [facility];  Service: Gynecology;  Laterality: N/A;   TUBAL LIGATION     UPPER GASTROINTESTINAL ENDOSCOPY  2018     Current Outpatient Medications:    amoxicillin-clavulanate  (AUGMENTIN) 875-125 MG tablet, Take 1 tablet by mouth 2 (two) times daily., Disp: 20 tablet, Rfl: 0   acetaminophen (TYLENOL) 500 MG tablet, Take 1,000 mg by mouth 2 (two) times daily as needed for mild pain or moderate pain., Disp: , Rfl:    albuterol (VENTOLIN HFA) 108 (90 Base) MCG/ACT inhaler, Inhale 2 puffs into the lungs every 6 (six) hours as needed for wheezing or shortness of breath., Disp: 18 g, Rfl: 2   atorvastatin (LIPITOR) 80 MG tablet, Take 1 tablet (80 mg total) by mouth daily., Disp: 90 tablet, Rfl: 2   budesonide-formoterol (SYMBICORT) 80-4.5 MCG/ACT inhaler, Inhale 2 puffs into the lungs 2 (two) times daily., Disp: 1 each, Rfl: 12   buPROPion (WELLBUTRIN SR) 150 MG 12 hr tablet, Take 1 tablet (150 mg total) by mouth 2 (two) times daily., Disp: 60 tablet, Rfl: 2   cetirizine (ZYRTEC) 10 MG tablet, Take 1 tablet (10 mg total) by mouth daily., Disp: 90 tablet, Rfl: 3   citalopram (CELEXA) 40 MG tablet, Take 1 tablet (40 mg total) by mouth daily., Disp: 90 tablet, Rfl: 3   famotidine (PEPCID) 20 MG tablet, Take 1 tablet (20 mg total) by mouth 2 (two) times daily., Disp: 60 tablet, Rfl: 3   glucose blood test strip, Use as instructed, Disp: 100 each, Rfl: 12  guaiFENesin (ROBITUSSIN) 100 MG/5ML liquid, Take 5 mLs by mouth every 4 (four) hours., Disp: 120 mL, Rfl: 0   Insulin Glargine-Lixisenatide (SOLIQUA) 100-33 UNT-MCG/ML SOPN, Inject 20 Units into the skin daily., Disp: 3 mL, Rfl: 11   Insulin Pen Needle (PEN NEEDLES) 31G X 5 MM MISC, 1 each by Does not apply route daily. Use to inject insulin, Disp: 100 each, Rfl: 0   lisinopril (ZESTRIL) 10 MG tablet, Take 1 tablet (10 mg total) by mouth daily., Disp: 90 tablet, Rfl: 3   tiotropium (SPIRIVA HANDIHALER) 18 MCG inhalation capsule, PLACE ONE CAPSULE INTO INHALER AND INHALE DAILY (Patient taking differently: Place 18 mcg into inhaler and inhale daily.), Disp: 90 capsule, Rfl: 3  Allergies  Allergen Reactions   Jardiance  [Empagliflozin] Other (See Comments)    Severe yeast infection   Latex Rash   Avandia [Rosiglitazone] Other (See Comments)    Headaches           Objective:  Physical Exam  General: AAO x3, NAD  Dermatological: On the right foot, sulcus of the toe plantarly going to the fifth metatarsal head is hyperkeratotic wound with some necrotic tissue present.  Ulceration goes along the interspace and starting to go to the lateral aspect of the fifth MPJ as well.  There is edema present in the right foot and mild erythema to the foot.  There is increased temperature compared to contralateral extremity.  There is no fluctuance or crepitation.       Vascular: Unable to palpate pulses in the foot.  Neruologic: Sensation decreased  Musculoskeletal: No pain on exam  Gait: Unassisted, Nonantalgic.       Assessment:   Ulceration with concern for osteomyelitis right foot, PAD     Plan:  -Treatment options discussed including all alternatives, risks, and complications -Etiology of symptoms were discussed -X-rays obtained and reviewed.  Radiodensity is noted on the fifth proximal phalanx as well as the metatarsal head concerning for early osteomyelitis. -As discussed with the patient in regards to her treatment options.  Given the worsening of the wound, decreased pulses in the infection I recommended the patient go to the emergency room for likely admission, IV antibiotics.  She is going need MRI as well as vascular workup as well.  Discussed likely toe amputation.  Unfortunately after discussion she is not able to the emergency room today although I discussed the importance of this with her.  She is to take care of her husband and get him settled before she goes to the hospital.  I would have started Augmentin and she is to go to the hospital as soon as she can.  Discussed with her the longer she waits with this wound but higher the chance of infection spreading, further amputation. -Surgical  shoe dispensed for offloading.  Vivi Barrack DPM

## 2022-11-21 NOTE — Telephone Encounter (Signed)
Patient has been updated that antibiotic was sent in, said that she picked up already.

## 2022-11-22 ENCOUNTER — Emergency Department (HOSPITAL_COMMUNITY): Payer: 59

## 2022-11-22 ENCOUNTER — Inpatient Hospital Stay (HOSPITAL_COMMUNITY): Payer: 59

## 2022-11-22 ENCOUNTER — Inpatient Hospital Stay (HOSPITAL_COMMUNITY)
Admission: EM | Admit: 2022-11-22 | Discharge: 2022-12-06 | DRG: 617 | Disposition: A | Discharge status: Home / Self Care | Payer: 59 | Attending: Internal Medicine | Admitting: Internal Medicine

## 2022-11-22 ENCOUNTER — Other Ambulatory Visit: Payer: Self-pay

## 2022-11-22 DIAGNOSIS — J449 Chronic obstructive pulmonary disease, unspecified: Secondary | ICD-10-CM | POA: Diagnosis not present

## 2022-11-22 DIAGNOSIS — I7090 Unspecified atherosclerosis: Secondary | ICD-10-CM | POA: Diagnosis not present

## 2022-11-22 DIAGNOSIS — F418 Other specified anxiety disorders: Secondary | ICD-10-CM | POA: Diagnosis not present

## 2022-11-22 DIAGNOSIS — I350 Nonrheumatic aortic (valve) stenosis: Secondary | ICD-10-CM | POA: Diagnosis present

## 2022-11-22 DIAGNOSIS — E781 Pure hyperglyceridemia: Secondary | ICD-10-CM | POA: Diagnosis present

## 2022-11-22 DIAGNOSIS — I70261 Atherosclerosis of native arteries of extremities with gangrene, right leg: Secondary | ICD-10-CM | POA: Diagnosis present

## 2022-11-22 DIAGNOSIS — K219 Gastro-esophageal reflux disease without esophagitis: Secondary | ICD-10-CM | POA: Diagnosis present

## 2022-11-22 DIAGNOSIS — I5020 Unspecified systolic (congestive) heart failure: Secondary | ICD-10-CM | POA: Diagnosis not present

## 2022-11-22 DIAGNOSIS — L02611 Cutaneous abscess of right foot: Secondary | ICD-10-CM | POA: Diagnosis present

## 2022-11-22 DIAGNOSIS — Z794 Long term (current) use of insulin: Secondary | ICD-10-CM

## 2022-11-22 DIAGNOSIS — E1152 Type 2 diabetes mellitus with diabetic peripheral angiopathy with gangrene: Secondary | ICD-10-CM | POA: Diagnosis present

## 2022-11-22 DIAGNOSIS — I50813 Acute on chronic right heart failure: Secondary | ICD-10-CM | POA: Diagnosis not present

## 2022-11-22 DIAGNOSIS — Z888 Allergy status to other drugs, medicaments and biological substances status: Secondary | ICD-10-CM | POA: Diagnosis not present

## 2022-11-22 DIAGNOSIS — J4489 Other specified chronic obstructive pulmonary disease: Secondary | ICD-10-CM | POA: Diagnosis present

## 2022-11-22 DIAGNOSIS — F32A Depression, unspecified: Secondary | ICD-10-CM

## 2022-11-22 DIAGNOSIS — I70202 Unspecified atherosclerosis of native arteries of extremities, left leg: Secondary | ICD-10-CM | POA: Diagnosis not present

## 2022-11-22 DIAGNOSIS — Z01818 Encounter for other preprocedural examination: Secondary | ICD-10-CM | POA: Diagnosis not present

## 2022-11-22 DIAGNOSIS — E1151 Type 2 diabetes mellitus with diabetic peripheral angiopathy without gangrene: Secondary | ICD-10-CM | POA: Diagnosis not present

## 2022-11-22 DIAGNOSIS — B952 Enterococcus as the cause of diseases classified elsewhere: Secondary | ICD-10-CM | POA: Diagnosis present

## 2022-11-22 DIAGNOSIS — I70235 Atherosclerosis of native arteries of right leg with ulceration of other part of foot: Secondary | ICD-10-CM

## 2022-11-22 DIAGNOSIS — I5022 Chronic systolic (congestive) heart failure: Secondary | ICD-10-CM | POA: Diagnosis present

## 2022-11-22 DIAGNOSIS — I745 Embolism and thrombosis of iliac artery: Secondary | ICD-10-CM | POA: Diagnosis present

## 2022-11-22 DIAGNOSIS — I96 Gangrene, not elsewhere classified: Secondary | ICD-10-CM | POA: Diagnosis not present

## 2022-11-22 DIAGNOSIS — F17210 Nicotine dependence, cigarettes, uncomplicated: Secondary | ICD-10-CM

## 2022-11-22 DIAGNOSIS — E1165 Type 2 diabetes mellitus with hyperglycemia: Secondary | ICD-10-CM | POA: Diagnosis present

## 2022-11-22 DIAGNOSIS — F410 Panic disorder [episodic paroxysmal anxiety] without agoraphobia: Secondary | ICD-10-CM | POA: Diagnosis present

## 2022-11-22 DIAGNOSIS — E669 Obesity, unspecified: Secondary | ICD-10-CM | POA: Diagnosis present

## 2022-11-22 DIAGNOSIS — F419 Anxiety disorder, unspecified: Secondary | ICD-10-CM | POA: Diagnosis not present

## 2022-11-22 DIAGNOSIS — B957 Other staphylococcus as the cause of diseases classified elsewhere: Secondary | ICD-10-CM | POA: Diagnosis present

## 2022-11-22 DIAGNOSIS — I110 Hypertensive heart disease with heart failure: Secondary | ICD-10-CM | POA: Diagnosis present

## 2022-11-22 DIAGNOSIS — Z9079 Acquired absence of other genital organ(s): Secondary | ICD-10-CM

## 2022-11-22 DIAGNOSIS — E11649 Type 2 diabetes mellitus with hypoglycemia without coma: Secondary | ICD-10-CM | POA: Diagnosis not present

## 2022-11-22 DIAGNOSIS — I10 Essential (primary) hypertension: Secondary | ICD-10-CM | POA: Diagnosis not present

## 2022-11-22 DIAGNOSIS — I959 Hypotension, unspecified: Secondary | ICD-10-CM | POA: Diagnosis not present

## 2022-11-22 DIAGNOSIS — Z91040 Latex allergy status: Secondary | ICD-10-CM

## 2022-11-22 DIAGNOSIS — Z90722 Acquired absence of ovaries, bilateral: Secondary | ICD-10-CM

## 2022-11-22 DIAGNOSIS — S91301A Unspecified open wound, right foot, initial encounter: Principal | ICD-10-CM

## 2022-11-22 DIAGNOSIS — M86171 Other acute osteomyelitis, right ankle and foot: Secondary | ICD-10-CM | POA: Diagnosis not present

## 2022-11-22 DIAGNOSIS — E785 Hyperlipidemia, unspecified: Secondary | ICD-10-CM | POA: Diagnosis not present

## 2022-11-22 DIAGNOSIS — L03115 Cellulitis of right lower limb: Secondary | ICD-10-CM | POA: Diagnosis present

## 2022-11-22 DIAGNOSIS — Z7951 Long term (current) use of inhaled steroids: Secondary | ICD-10-CM

## 2022-11-22 DIAGNOSIS — E11621 Type 2 diabetes mellitus with foot ulcer: Secondary | ICD-10-CM

## 2022-11-22 DIAGNOSIS — Z79899 Other long term (current) drug therapy: Secondary | ICD-10-CM

## 2022-11-22 DIAGNOSIS — Z716 Tobacco abuse counseling: Secondary | ICD-10-CM

## 2022-11-22 DIAGNOSIS — Z803 Family history of malignant neoplasm of breast: Secondary | ICD-10-CM

## 2022-11-22 DIAGNOSIS — E1169 Type 2 diabetes mellitus with other specified complication: Principal | ICD-10-CM

## 2022-11-22 DIAGNOSIS — M869 Osteomyelitis, unspecified: Secondary | ICD-10-CM | POA: Diagnosis present

## 2022-11-22 DIAGNOSIS — J189 Pneumonia, unspecified organism: Secondary | ICD-10-CM | POA: Diagnosis not present

## 2022-11-22 DIAGNOSIS — Z87442 Personal history of urinary calculi: Secondary | ICD-10-CM

## 2022-11-22 DIAGNOSIS — K589 Irritable bowel syndrome without diarrhea: Secondary | ICD-10-CM | POA: Diagnosis present

## 2022-11-22 DIAGNOSIS — L97519 Non-pressure chronic ulcer of other part of right foot with unspecified severity: Secondary | ICD-10-CM | POA: Diagnosis not present

## 2022-11-22 DIAGNOSIS — Z6829 Body mass index (BMI) 29.0-29.9, adult: Secondary | ICD-10-CM

## 2022-11-22 DIAGNOSIS — L859 Epidermal thickening, unspecified: Secondary | ICD-10-CM | POA: Diagnosis present

## 2022-11-22 DIAGNOSIS — Z8041 Family history of malignant neoplasm of ovary: Secondary | ICD-10-CM

## 2022-11-22 DIAGNOSIS — F4310 Post-traumatic stress disorder, unspecified: Secondary | ICD-10-CM | POA: Diagnosis not present

## 2022-11-22 DIAGNOSIS — I70221 Atherosclerosis of native arteries of extremities with rest pain, right leg: Secondary | ICD-10-CM | POA: Diagnosis not present

## 2022-11-22 DIAGNOSIS — Z833 Family history of diabetes mellitus: Secondary | ICD-10-CM

## 2022-11-22 DIAGNOSIS — Z90710 Acquired absence of both cervix and uterus: Secondary | ICD-10-CM

## 2022-11-22 DIAGNOSIS — E119 Type 2 diabetes mellitus without complications: Secondary | ICD-10-CM

## 2022-11-22 DIAGNOSIS — Z83719 Family history of colon polyps, unspecified: Secondary | ICD-10-CM

## 2022-11-22 DIAGNOSIS — Z7985 Long-term (current) use of injectable non-insulin antidiabetic drugs: Secondary | ICD-10-CM | POA: Diagnosis not present

## 2022-11-22 HISTORY — DX: Other complications of anesthesia, initial encounter: T88.59XA

## 2022-11-22 LAB — CBC WITH DIFFERENTIAL/PLATELET
Abs Immature Granulocytes: 0.05 10*3/uL (ref 0.00–0.07)
Basophils Absolute: 0.1 10*3/uL (ref 0.0–0.1)
Basophils Relative: 1 %
Eosinophils Absolute: 0.4 10*3/uL (ref 0.0–0.5)
Eosinophils Relative: 3 %
HCT: 50.3 % — ABNORMAL HIGH (ref 36.0–46.0)
Hemoglobin: 16 g/dL — ABNORMAL HIGH (ref 12.0–15.0)
Immature Granulocytes: 0 %
Lymphocytes Relative: 16 %
Lymphs Abs: 2.4 10*3/uL (ref 0.7–4.0)
MCH: 29.1 pg (ref 26.0–34.0)
MCHC: 31.8 g/dL (ref 30.0–36.0)
MCV: 91.6 fL (ref 80.0–100.0)
Monocytes Absolute: 0.8 10*3/uL (ref 0.1–1.0)
Monocytes Relative: 5 %
Neutro Abs: 11 10*3/uL — ABNORMAL HIGH (ref 1.7–7.7)
Neutrophils Relative %: 75 %
Platelets: 543 10*3/uL — ABNORMAL HIGH (ref 150–400)
RBC: 5.49 MIL/uL — ABNORMAL HIGH (ref 3.87–5.11)
RDW: 15.8 % — ABNORMAL HIGH (ref 11.5–15.5)
WBC: 14.7 10*3/uL — ABNORMAL HIGH (ref 4.0–10.5)
nRBC: 0 % (ref 0.0–0.2)

## 2022-11-22 LAB — HEPATIC FUNCTION PANEL
ALT: 10 U/L (ref 0–44)
AST: 11 U/L — ABNORMAL LOW (ref 15–41)
Albumin: 3.3 g/dL — ABNORMAL LOW (ref 3.5–5.0)
Alkaline Phosphatase: 124 U/L (ref 38–126)
Bilirubin, Direct: <0.1 mg/dL (ref 0.0–0.2)
Total Bilirubin: 0.6 mg/dL (ref 0.3–1.2)
Total Protein: 7.3 g/dL (ref 6.5–8.1)

## 2022-11-22 LAB — BASIC METABOLIC PANEL
Anion gap: 12 (ref 5–15)
BUN: 6 mg/dL — ABNORMAL LOW (ref 8–23)
CO2: 26 mmol/L (ref 22–32)
Calcium: 9.5 mg/dL (ref 8.9–10.3)
Chloride: 97 mmol/L — ABNORMAL LOW (ref 98–111)
Creatinine, Ser: 0.52 mg/dL (ref 0.44–1.00)
GFR, Estimated: >60 mL/min (ref >60)
Glucose, Bld: 194 mg/dL — ABNORMAL HIGH (ref 70–99)
Potassium: 3.9 mmol/L (ref 3.5–5.1)
Sodium: 135 mmol/L (ref 135–145)

## 2022-11-22 LAB — GLUCOSE, CAPILLARY
Glucose-Capillary: 120 mg/dL — ABNORMAL HIGH (ref 70–99)
Glucose-Capillary: 106 mg/dL — ABNORMAL HIGH (ref 70–99)

## 2022-11-22 LAB — MAGNESIUM: Magnesium: 1.9 mg/dL (ref 1.7–2.4)

## 2022-11-22 LAB — C-REACTIVE PROTEIN: CRP: 14.7 mg/dL — ABNORMAL HIGH (ref <1.0)

## 2022-11-22 LAB — PROTIME-INR
INR: 1 (ref 0.8–1.2)
Prothrombin Time: 13.3 seconds (ref 11.4–15.2)

## 2022-11-22 LAB — LACTIC ACID, PLASMA
Lactic Acid, Venous: 1.9 mmol/L (ref 0.5–1.9)
Lactic Acid, Venous: 1.7 mmol/L (ref 0.5–1.9)

## 2022-11-22 LAB — SEDIMENTATION RATE: Sed Rate: 22 mm/hr (ref 0–22)

## 2022-11-22 MED ORDER — CITALOPRAM HYDROBROMIDE 20 MG PO TABS
40.0000 mg | ORAL_TABLET | Freq: Every day | ORAL | Status: DC
  Administered 2022-11-23 – 2022-12-06 (×13): 40 mg via ORAL
  Filled 2022-11-22: qty 2
  Filled 2022-11-22: qty 4
  Filled 2022-11-22 (×4): qty 2
  Filled 2022-11-22: qty 4
  Filled 2022-11-22 (×7): qty 2

## 2022-11-22 MED ORDER — VANCOMYCIN HCL IN DEXTROSE 1-5 GM/200ML-% IV SOLN
1000.0000 mg | Freq: Once | INTRAVENOUS | Status: AC
  Administered 2022-11-22: 1000 mg via INTRAVENOUS
  Filled 2022-11-22: qty 200

## 2022-11-22 MED ORDER — UMECLIDINIUM BROMIDE 62.5 MCG/ACT IN AEPB
1.0000 | INHALATION_SPRAY | Freq: Every day | RESPIRATORY_TRACT | Status: DC
  Administered 2022-11-23 – 2022-12-06 (×12): 1 via RESPIRATORY_TRACT
  Filled 2022-11-22 (×5): qty 7

## 2022-11-22 MED ORDER — LISINOPRIL 10 MG PO TABS
10.0000 mg | ORAL_TABLET | Freq: Every day | ORAL | Status: DC
  Administered 2022-11-23 – 2022-11-29 (×7): 10 mg via ORAL
  Filled 2022-11-22 (×8): qty 1

## 2022-11-22 MED ORDER — BUPROPION HCL ER (SR) 150 MG PO TB12
150.0000 mg | ORAL_TABLET | Freq: Two times a day (BID) | ORAL | Status: DC
  Administered 2022-11-22 – 2022-12-06 (×26): 150 mg via ORAL
  Filled 2022-11-22 (×28): qty 1

## 2022-11-22 MED ORDER — ATORVASTATIN CALCIUM 80 MG PO TABS
80.0000 mg | ORAL_TABLET | Freq: Every day | ORAL | Status: DC
  Administered 2022-11-23 – 2022-12-06 (×13): 80 mg via ORAL
  Filled 2022-11-22: qty 1
  Filled 2022-11-22: qty 2
  Filled 2022-11-22 (×5): qty 1
  Filled 2022-11-22: qty 2
  Filled 2022-11-22 (×5): qty 1

## 2022-11-22 MED ORDER — METRONIDAZOLE 500 MG/100ML IV SOLN
500.0000 mg | Freq: Once | INTRAVENOUS | Status: AC
  Administered 2022-11-22: 500 mg via INTRAVENOUS
  Filled 2022-11-22: qty 100

## 2022-11-22 MED ORDER — INSULIN GLARGINE-YFGN 100 UNIT/ML ~~LOC~~ SOLN
15.0000 [IU] | Freq: Every day | SUBCUTANEOUS | Status: DC
  Administered 2022-11-22 – 2022-11-24 (×3): 15 [IU] via SUBCUTANEOUS
  Filled 2022-11-22 (×6): qty 0.15

## 2022-11-22 MED ORDER — ENOXAPARIN SODIUM 40 MG/0.4ML IJ SOSY
40.0000 mg | PREFILLED_SYRINGE | INTRAMUSCULAR | Status: DC
  Administered 2022-11-23 – 2022-12-06 (×12): 40 mg via SUBCUTANEOUS
  Filled 2022-11-22 (×13): qty 0.4

## 2022-11-22 MED ORDER — ENOXAPARIN SODIUM 30 MG/0.3ML IJ SOSY: 30.0000 mg | PREFILLED_SYRINGE | INTRAMUSCULAR | Status: DC

## 2022-11-22 MED ORDER — ACETAMINOPHEN 500 MG PO TABS
1000.0000 mg | ORAL_TABLET | Freq: Three times a day (TID) | ORAL | Status: DC
  Administered 2022-11-22 – 2022-12-06 (×36): 1000 mg via ORAL
  Filled 2022-11-22 (×39): qty 2

## 2022-11-22 MED ORDER — FLUTICASONE FUROATE-VILANTEROL 100-25 MCG/ACT IN AEPB
1.0000 | INHALATION_SPRAY | Freq: Every day | RESPIRATORY_TRACT | Status: DC
  Administered 2022-11-23 – 2022-12-06 (×12): 1 via RESPIRATORY_TRACT
  Filled 2022-11-22 (×3): qty 28

## 2022-11-22 MED ORDER — ONDANSETRON HCL 4 MG/2ML IJ SOLN
4.0000 mg | Freq: Four times a day (QID) | INTRAMUSCULAR | Status: DC | PRN
  Administered 2022-11-22 (×2): 4 mg via INTRAVENOUS
  Filled 2022-11-22 (×2): qty 2

## 2022-11-22 MED ORDER — VANCOMYCIN HCL 1250 MG/250ML IV SOLN
1250.0000 mg | INTRAVENOUS | Status: DC
  Administered 2022-11-23 – 2022-11-26 (×4): 1250 mg via INTRAVENOUS
  Filled 2022-11-22 (×4): qty 250

## 2022-11-22 MED ORDER — SODIUM CHLORIDE 0.9 % IV SOLN: INTRAVENOUS | Status: AC

## 2022-11-22 MED ORDER — HYDROMORPHONE HCL 1 MG/ML IJ SOLN
1.0000 mg | INTRAMUSCULAR | Status: DC | PRN
  Administered 2022-11-22 – 2022-11-30 (×14): 1 mg via INTRAVENOUS
  Filled 2022-11-22 (×13): qty 1

## 2022-11-22 MED ORDER — SODIUM CHLORIDE 0.9 % IV BOLUS (SEPSIS)
1000.0000 mL | Freq: Once | INTRAVENOUS | Status: AC
  Administered 2022-11-22: 1000 mL via INTRAVENOUS

## 2022-11-22 MED ORDER — TIOTROPIUM BROMIDE MONOHYDRATE 18 MCG IN CAPS: 18.0000 ug | ORAL_CAPSULE | Freq: Every day | RESPIRATORY_TRACT | Status: DC

## 2022-11-22 MED ORDER — ALBUTEROL SULFATE HFA 108 (90 BASE) MCG/ACT IN AERS: 2.0000 | INHALATION_SPRAY | Freq: Four times a day (QID) | RESPIRATORY_TRACT | Status: DC | PRN

## 2022-11-22 MED ORDER — ALBUTEROL SULFATE (2.5 MG/3ML) 0.083% IN NEBU
2.5000 mg | INHALATION_SOLUTION | Freq: Four times a day (QID) | RESPIRATORY_TRACT | Status: DC | PRN
  Administered 2022-11-26: 2.5 mg via RESPIRATORY_TRACT
  Filled 2022-11-22 (×3): qty 3

## 2022-11-22 MED ORDER — HYDROMORPHONE HCL 1 MG/ML IJ SOLN
1.0000 mg | Freq: Once | INTRAMUSCULAR | Status: AC
  Administered 2022-11-22: 1 mg via INTRAVENOUS
  Filled 2022-11-22: qty 1

## 2022-11-22 MED ORDER — NICOTINE 21 MG/24HR TD PT24: 21.0000 mg | MEDICATED_PATCH | Freq: Every day | TRANSDERMAL | Status: DC | PRN

## 2022-11-22 MED ORDER — PANTOPRAZOLE SODIUM 40 MG PO TBEC
40.0000 mg | DELAYED_RELEASE_TABLET | Freq: Every day | ORAL | Status: DC
  Administered 2022-11-22 – 2022-12-06 (×14): 40 mg via ORAL
  Filled 2022-11-22 (×14): qty 1

## 2022-11-22 MED ORDER — SODIUM CHLORIDE 0.9 % IV SOLN
2.0000 g | Freq: Three times a day (TID) | INTRAVENOUS | Status: DC
  Administered 2022-11-22 – 2022-11-30 (×23): 2 g via INTRAVENOUS
  Filled 2022-11-22 (×24): qty 12.5

## 2022-11-22 MED ORDER — INSULIN ASPART 100 UNIT/ML IJ SOLN
0.0000 [IU] | INTRAMUSCULAR | Status: DC
  Administered 2022-11-23: 2 [IU] via SUBCUTANEOUS

## 2022-11-22 MED ORDER — INSULIN GLARGINE-YFGN 100 UNIT/ML ~~LOC~~ SOLN
20.0000 [IU] | Freq: Every day | SUBCUTANEOUS | Status: DC
Start: 2022-11-22 — End: 2022-11-22

## 2022-11-22 NOTE — ED Notes (Signed)
Patient SPO2 @ 83%RA, patient states she wears o2 at home on and off. Patient placed on 3liters via [place]. SPO2 @ 95%. MD made aware. Will continue to monitor

## 2022-11-22 NOTE — H&P (Cosign Needed)
Date: 11/22/2022               Patient Name:  Vanessa [name] MRN: [id number]  DOB: Feb 02, 1961 Age / Sex: 62 y.o., female   PCP: Evlyn Kanner, MD         Medical Service: Internal Medicine Teaching Service         Attending Physician: Dr. Reymundo Poll, MD      First Contact: Dr. Rocky Morel, DO Pager [phone]    Second Contact: Dr. Sharrell Ku, MD Pager [phone]         After Hours (After 5p/  First Contact Pager: [phone]  weekends / holidays): Second Contact Pager: [phone]   SUBJECTIVE   Chief Complaint: Right foot wound  History of Present Illness:  Vanessa [name] is a 62 year old female with past medical history of T2DM, COPD, HTN, and IBS who presents with progressively worsening ulcer on her right foot.  About 3 weeks ago she pulled a broken piece of skin off her foot and this wound has progressively gotten worse.  She also noticed a painful red wound on the medial first MTP on the right foot as well that developed last week.  She does have some ankle pain but existed because she was try to walk and avoid putting pressure on her wound.  No midfoot pain and no redness or pain up her leg.  She was recently seen in our clinic on 10/25/2022 and was prescribed 3 days of Bactrim for UTI.  She has not had any fevers, chills, lymphatic tracking, pain going up her leg, or any other sores.  She was seen by podiatry yesterday who discussed treatment options including toe amputation after getting an x-ray which was concerning for early osteomyelitis.  She was started on Augmentin and sent to the ED today.  ED Course: Hemodynamically stable in the ED.  Blood cultures and MRI of the foot were ordered.  She was started on metronidazole and vancomycin.  [facility] was paged for admission.  Past Medical History T2DM COPD Hypertension IBS Anxiety/depression GERD Hyperlipidemia  Meds:  Albuterol as needed Atorvastatin 80 mg daily Symbicort 80-4.5 mcg 2 puffs twice daily Bupropion 150 mg  twice daily Cetirizine 10 mg daily Citalopram 40 mg daily Soliqua 30 units nightly Lisinopril 10 mg daily Spiriva 18 mcg daily  Past Surgical History Past Surgical History:  Procedure Laterality Date   CESAREAN SECTION     with 3 children   COLONOSCOPY  2018   OVARIAN CYST REMOVAL     Age 9   ROBOTIC ASSISTED BILATERAL SALPINGO OOPHERECTOMY Bilateral 11/23/2021   Procedure: XI ROBOTIC ASSISTED BILATERAL SALPINGO OOPHORECTOMY,  MINI LAPAROTOMY;  Surgeon: Carver Fila, MD;  Location: [facility];  Service: Gynecology;  Laterality: Bilateral;   ROBOTIC ASSISTED TOTAL HYSTERECTOMY N/A 11/23/2021   Procedure: XI ROBOTIC ASSISTED TOTAL HYSTERECTOMY, STAGING, CYSTOSCOPY;  Surgeon: Carver Fila, MD;  Location: [facility];  Service: Gynecology;  Laterality: N/A;   TUBAL LIGATION     UPPER GASTROINTESTINAL ENDOSCOPY  2018    Social:  Lives at home with her husband, 2 dogs, and 1 cat. Level of Function: Independent in ADLs and IADLs PCP: Evlyn Kanner, MD Substances: 60+ pack year history and currently smokes 1.5-2 packs/day.  Denies alcohol or drug use.  Family History:  Family History  Problem Relation Age of Onset   Colon polyps Mother        benign   Ovarian cancer Mother 21   Breast cancer Maternal Aunt  dx late 78s   Diabetes Maternal Uncle    Colon cancer Neg Hx    Stomach cancer Neg Hx    Endometrial cancer Neg Hx    Prostate cancer Neg Hx    Pancreatic cancer Neg Hx    Esophageal cancer Neg Hx    Rectal cancer Neg Hx      Allergies: Allergies as of 11/22/2022 - Review Complete 11/22/2022  Allergen Reaction Noted   Jardiance [empagliflozin] Other (See Comments) 03/10/2018   Latex Rash 05/11/2020   Avandia [rosiglitazone] Other (See Comments) 06/14/2006    Review of Systems: A complete ROS was negative except as per HPI.   OBJECTIVE:   Physical Exam: Blood pressure (!) 113/58, pulse 99, temperature 98.3 F (36.8 C), temperature source Oral,  resp. rate 16, height 5\' 2"  (1.575 m), weight 65.8 kg, last menstrual period 12/24/2013, SpO2 98 %.  Constitutional: Elderly female. In no acute distress. HENT: Normocephalic, atraumatic,  Eyes: Sclera non-icteric, PERRL, EOM intact Cardio:Regular rate and rhythm. No murmurs, rubs, or gallops. 2+ bilateral radial pulses.  Decreased bilateral dorsalis pedis pulses. Pulm: Coarse breath sounds bilaterally with no significant wheezing. Abdomen: Soft, non-tender, non-distended, positive bowel sounds. MSK/skin: Roughly 2 cm area of ulceration on the plantar aspect of the right foot beneath the fourth and fifth toes that extends laterally around the fifth toe base with thick skin surrounding and viable erythematous skin underlying on the edges of the wound.  Significant granulation tissue or purulent material underlying wound.  No midfoot tenderness.  There is mild warmth extending up the right foot and into the ankle but no significant warmth of the leg.  Round subcentimeter erythematous nodule on the medial first MTP on the right foot that is tender to palpation. Neuro:Alert and oriented x3. No focal deficit noted. Psych:Pleasant mood and affect.  Labs: CBC    Component Value Date/Time   WBC 14.7 (H) 11/22/2022 1045   RBC 5.49 (H) 11/22/2022 1045   HGB 16.0 (H) 11/22/2022 1045   HGB 17.0 (H) 09/21/2021 1543   HCT 50.3 (H) 11/22/2022 1045   HCT 49.4 (H) 09/21/2021 1543   PLT 543 (H) 11/22/2022 1045   PLT 464 (H) 09/21/2021 1543   MCV 91.6 11/22/2022 1045   MCV 88 09/21/2021 1543   MCH 29.1 11/22/2022 1045   MCHC 31.8 11/22/2022 1045   RDW 15.8 (H) 11/22/2022 1045   RDW 12.1 09/21/2021 1543   LYMPHSABS 2.4 11/22/2022 1045   LYMPHSABS 3.1 09/21/2021 1543   MONOABS 0.8 11/22/2022 1045   EOSABS 0.4 11/22/2022 1045   EOSABS 0.4 09/21/2021 1543   BASOSABS 0.1 11/22/2022 1045   BASOSABS 0.1 09/21/2021 1543     CMP     Component Value Date/Time   NA 135 11/22/2022 1045   NA 142 09/21/2021  1543   K 3.9 11/22/2022 1045   CL 97 (L) 11/22/2022 1045   CO2 26 11/22/2022 1045   GLUCOSE 194 (H) 11/22/2022 1045   BUN 6 (L) 11/22/2022 1045   BUN 8 09/21/2021 1543   CREATININE 0.52 11/22/2022 1045   CREATININE 0.56 11/04/2021 1019   CREATININE 0.45 (L) 12/03/2014 1542   CALCIUM 9.5 11/22/2022 1045   PROT 7.3 11/22/2022 1142   ALBUMIN 3.3 (L) 11/22/2022 1142   AST 11 (L) 11/22/2022 1142   ALT 10 11/22/2022 1142   ALKPHOS 124 11/22/2022 1142   BILITOT 0.6 11/22/2022 1142   GFRNONAA >60 11/22/2022 1045   GFRNONAA >60 11/04/2021 1019   GFRNONAA >89  12/03/2014 1542   GFRAA 125 03/11/2020 1644   GFRAA >89 12/03/2014 1542    Imaging: DG Chest Port 1 View Result Date: 11/22/2022 IMPRESSION: 1. Hazy bibasilar airspace opacities could represent atelectasis or infection. 2. Likely small bilateral pleural effusions. Electronically Signed   By: Lorenza Cambridge M.D.   On: 11/22/2022 12:45     EKG: personally reviewed my interpretation is sinus rhythm with possible left atrial enlargement. Consistent with prior EKG.  ASSESSMENT & PLAN:   Assessment & Plan by Problem: Principal Problem:   Osteomyelitis (HCC)   Vanessa [name] is a 62 y.o. female with pertinent PMH of T2DM, COPD, HTN, and IBS who presents with progressively worsening ulcer on her right foot and is admitted for concern of osteomyelitis.  Nonhealing plantar ulcer on the right foot Complicated by uncontrolled diabetes and peripheral artery disease Uncontrolled diabetes with an A1c of 10.6 and history of claudication symptoms for over a year and lower extremities.  X-ray of the right foot in the podiatry office yesterday showed signs of early osteomyelitis.  Started on vancomycin and cefepime and blood cultures were drawn.  She was also started on Augmentin by podiatry prior to coming to the ED.  Unlikely to get useful surgical cultures and since we are waiting on podiatry input we will continue on antibiotics.  Pending MRI of the  foot.  ED discussed the case with podiatry and vascular surgery who will see the patient.  Low suspicion for bacteremia, lactic acid 1.9-1.7.  CRP is elevated at 14.7 but ESR is normal at 22. - Appreciate vascular and podiatry evaluation and recommendations - Considering angiogram with possible revascularization as well as amputation - Continue vancomycin and cefepime - Follow blood cultures - Pain control with Tylenol 1000 mg every 8 hours and Dilaudid 1 mg IV every 2 hours as needed - N.p.o. at midnight  T2DM A1c 10/25/2022 was 10.6.  She is on Soliqua 30 units nightly at home.  She is n.p.o. right now but may be started on a diet if there is no plan for surgery today or tomorrow.  Will repeat A1c. - Semglee 15 units nightly and moderate SSI.  If diet started would increase to Semglee 20-25 units nightly  COPD Continue home inhaler equivalents with Breo Ellipta, Incruse Ellipta, and as needed albuterol.  Hypertension Normotensive here and is on lisinopril 10 mg daily at home. - Resume lisinopril 10 mg daily  Anxiety/depression Tobacco use disorder Will continue home medications of bupropion and citalopram. - Bupropion 150 mg twice daily, citalopram 40 mg daily, and nicotine patch daily as needed  HLD Continue home atorvastatin 80 mg daily.  Diet: Carb-Modified n.p.o. at midnight VTE: Enoxaparin Code: Full  Dispo: Admit patient to Inpatient with expected length of stay greater than 2 midnights.  Signed: Rocky Morel, DO Internal Medicine Resident PGY-1  11/22/2022, 2:03 PM   Dr. Rocky Morel, DO Pager [phone]

## 2022-11-22 NOTE — TOC Initial Note (Signed)
Transition of Care [facility]) - Initial/Assessment Note    Patient Details  Name: Vanessa [name] MRN: [id number] Date of Birth: [birth year]  [facility] CM/SW Contact:    Janae Bridgeman, RN Phone Number: 11/22/2022, 4:12 PM  Clinical Narrative:                 Attempted to meet with the patient at the bedside and patient was off the unit for a procedure.  No TOC needs determined at this time. CM will continue to follow the patient for any needs relating the patient's admission to the hospital for osteomyelitis.  The patient lives at home with family.        Patient Goals and CMS Choice            Expected Discharge Plan and Services                                              Prior Living Arrangements/Services                       Activities of Daily Living   ADL Screening (condition at time of admission) Patient's cognitive ability adequate to safely complete daily activities?: Yes Is the patient deaf or have difficulty hearing?: No Does the patient have difficulty seeing, even when wearing glasses/contacts?: No Does the patient have difficulty concentrating, remembering, or making decisions?: No  Permission Sought/Granted                  Emotional Assessment              Admission diagnosis:  Osteomyelitis (HCC) [M86.9] Wound, open, foot, right, initial encounter [S91.301A] Patient Active Problem List   Diagnosis Date Noted   Osteomyelitis (HCC) 11/22/2022   Acute respiratory failure with hypoxia (HCC) 08/05/2022   Multifocal pneumonia 08/05/2022   HTN (hypertension) 08/05/2022   Claudication of lower extremity (HCC) 05/31/2022   Genetic testing 01/02/2022   Ovarian cancer (HCC) 12/02/2021   Seasonal allergies 09/23/2021   Pelvic mass 05/12/2020   Elevated CEA 05/12/2020   Polycythemia, 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 ([place]), [place] - [address] [place] ([place]) [place] [address] Phone: [phone] Fax: [phone]  [facility] - [place], [place] - [address] [place] [address] Phone: [phone] Fax: [phone]     Social Determinants of Health (SDOH) Social History: SDOH Screenings   Food Insecurity: No Food Insecurity (11/22/2022)  Housing: Low Risk  (08/05/2022)  Transportation Needs: No Transportation Needs (08/05/2022)  Utilities: Not At Risk (11/22/2022)  Depression (PHQ2-9): High Risk (10/25/2022)  Social Connections: Moderately Isolated (10/25/2022)  Tobacco Use: High Risk (10/25/2022)   SDOH Interventions:     Readmission Risk Interventions  11/22/2022    4:12 PM  Readmission Risk Prevention Plan  Transportation Screening Complete  PCP or Specialist Appt within 5-7 Days Complete  Home Care Screening Complete  Medication Review (RN CM) Complete

## 2022-11-22 NOTE — ED Notes (Signed)
ED TO INPATIENT HANDOFF REPORT  ED Nurse Name and Phone #:   S Name/Age/Gender Izell Louisburg 62 y.o. female Room/Bed: 006C/006C  Code Status   Code Status: Full Code  Home/SNF/Other Home Patient oriented to: self, place, time, and situation Is this baseline? Yes   Triage Complete: Triage complete  Chief Complaint Osteomyelitis [facility]) [M86.9]  Triage Note Pt. Stated, Dr. Ardelle Anton sent me here to be admitted for care of the rt. Foot. I have an ulcer on bottom . Oxygen is low sometimes.   Allergies Allergies  Allergen Reactions   Jardiance [Empagliflozin] Other (See Comments)    Severe yeast infection   Latex Rash   Avandia [Rosiglitazone] Other (See Comments)    Headaches     Level of Care/Admitting Diagnosis ED Disposition     ED Disposition  Admit   Condition  --   Comment  Hospital Area: [facility] [[id number]]  Level of Care: Telemetry Medical [104]  May admit patient to [facility] or [facility] if equivalent level of care is available:: Yes  Covid Evaluation: Asymptomatic - no recent exposure (last 10 days) testing not required  Diagnosis: Osteomyelitis [facility]) [[id number]]  Admitting Physician: Reymundo Poll [[id number]]  Attending Physician: Arnetha Courser  Certification:: I certify this patient will need inpatient services for at least 2 midnights  Estimated Length of Stay: 3          B Medical/Surgery History Past Medical History:  Diagnosis Date   Asthma    No PFT performed   Blood transfusion without reported diagnosis    has to donate blood due to having "thick" blood.   COPD (chronic obstructive pulmonary disease) (HCC)    Depression    Diabetes mellitus 2002   GERD (gastroesophageal reflux disease)    Helicobacter pylori (H. pylori) infection    s/p triple therapy   Hepatic hemangioma    History of kidney stones    Hyperlipidemia    Hypertension    Hypertriglyceridemia    Panic attacks    mostly Agaraphobia    Pneumonia    Ventral hernia    Past Surgical History:  Procedure Laterality Date   CESAREAN SECTION     with 3 children   COLONOSCOPY  2018   OVARIAN CYST REMOVAL     Age 82   ROBOTIC ASSISTED BILATERAL SALPINGO OOPHERECTOMY Bilateral 11/23/2021   Procedure: XI ROBOTIC ASSISTED BILATERAL SALPINGO OOPHORECTOMY,  MINI LAPAROTOMY;  Surgeon: Carver Fila, MD;  Location: [facility];  Service: Gynecology;  Laterality: Bilateral;   ROBOTIC ASSISTED TOTAL HYSTERECTOMY N/A 11/23/2021   Procedure: XI ROBOTIC ASSISTED TOTAL HYSTERECTOMY, STAGING, CYSTOSCOPY;  Surgeon: Carver Fila, MD;  Location: [facility];  Service: Gynecology;  Laterality: N/A;   TUBAL LIGATION     UPPER GASTROINTESTINAL ENDOSCOPY  2018     A IV Location/Drains/Wounds Patient Lines/Drains/Airways Status     Active Line/Drains/Airways     Name Placement date Placement time Site Days   Peripheral IV 11/22/22 18 G Anterior;Right Forearm 11/22/22  1201  Forearm  less than 1   Peripheral IV 11/22/22 18 G Anterior;Left;Proximal Forearm 11/22/22  1201  Forearm  less than 1   NG/OG Vented/Dual Lumen Oral 11/23/21  1540  Oral  364   Incision - 4 Ports Abdomen 1: Left;Upper 2: Left;Lateral 3: Right;Lateral;Mid 4: Right;Lateral 11/23/21  1551  -- 364            Intake/Output Last 24 hours No intake or output data  in the 24 hours ending 11/22/22 1338  Labs/Imaging Results for orders placed or performed during the hospital encounter of 11/22/22 (from the past 48 hour(s))  CBC with Differential     Status: Abnormal   Collection Time: 11/22/22 10:45 AM  Result Value Ref Range   WBC 14.7 (H) 4.0 - 10.5 K/uL   RBC 5.49 (H) 3.87 - 5.11 MIL/uL   Hemoglobin 16.0 (H) 12.0 - 15.0 g/dL   HCT 16.1 (H) 09.6 - 04.5 %   MCV 91.6 80.0 - 100.0 fL   MCH 29.1 26.0 - 34.0 pg   MCHC 31.8 30.0 - 36.0 g/dL   RDW 40.9 (H) 81.1 - 91.4 %   Platelets 543 (H) 150 - 400 K/uL   nRBC 0.0 0.0 - 0.2 %   Neutrophils Relative % 75 %   Neutro  Abs 11.0 (H) 1.7 - 7.7 K/uL   Lymphocytes Relative 16 %   Lymphs Abs 2.4 0.7 - 4.0 K/uL   Monocytes Relative 5 %   Monocytes Absolute 0.8 0.1 - 1.0 K/uL   Eosinophils Relative 3 %   Eosinophils Absolute 0.4 0.0 - 0.5 K/uL   Basophils Relative 1 %   Basophils Absolute 0.1 0.0 - 0.1 K/uL   Immature Granulocytes 0 %   Abs Immature Granulocytes 0.05 0.00 - 0.07 K/uL    Comment: Performed at [facility], [address] [place], [place] [address]  Basic metabolic panel     Status: Abnormal   Collection Time: 11/22/22 10:45 AM  Result Value Ref Range   Sodium 135 135 - 145 mmol/L   Potassium 3.9 3.5 - 5.1 mmol/L   Chloride 97 (L) 98 - 111 mmol/L   CO2 26 22 - 32 mmol/L   Glucose, Bld 194 (H) 70 - 99 mg/dL    Comment: Glucose reference range applies only to samples taken after fasting for at least 8 hours.   BUN 6 (L) 8 - 23 mg/dL   Creatinine, Ser 6.21 0.44 - 1.00 mg/dL   Calcium 9.5 8.9 - 30.8 mg/dL   GFR, Estimated >65 >78 mL/min    Comment: (NOTE) Calculated using the CKD-EPI Creatinine Equation (2021)    Anion gap 12 5 - 15    Comment: Performed at [facility], [address] [place], [place] [address]  Protime-INR     Status: None   Collection Time: 11/22/22 11:42 AM  Result Value Ref Range   Prothrombin Time 13.3 11.4 - 15.2 seconds   INR 1.0 0.8 - 1.2    Comment: (NOTE) INR goal varies based on device and disease states. Performed at [facility], [address] [place], [place] [address]   C-reactive protein     Status: Abnormal   Collection Time: 11/22/22 11:42 AM  Result Value Ref Range   CRP 14.7 (H) <1.0 mg/dL    Comment: Performed at [facility], [address], [place], [place] [address]  Hepatic function panel     Status: Abnormal   Collection Time: 11/22/22 11:42 AM  Result Value Ref Range   Total Protein 7.3 6.5 - 8.1 g/dL   Albumin 3.3 (L) 3.5 - 5.0 g/dL   AST 11 (L) 15 - 41 U/L   ALT 10 0 - 44 U/L   Alkaline Phosphatase 124  38 - 126 U/L   Total Bilirubin 0.6 0.3 - 1.2 mg/dL   Bilirubin, Direct <0.1 0.0 - 0.2 mg/dL   Indirect Bilirubin NOT CALCULATED  0.3 - 0.9 mg/dL    Comment: Performed at [facility], [address] [place], [place] [address]  Magnesium     Status: None   Collection Time: 11/22/22 11:42 AM  Result Value Ref Range   Magnesium 1.9 1.7 - 2.4 mg/dL    Comment: Performed at [facility], [address], [place], [place] [address]  Lactic acid, plasma     Status: None   Collection Time: 11/22/22 11:54 AM  Result Value Ref Range   Lactic Acid, Venous 1.9 0.5 - 1.9 mmol/L    Comment: Performed at [facility], [address] [place], [place] [address]   DG Chest Port 1 View  Result Date: 11/22/2022 CLINICAL DATA:  Questionable sepsis - evaluate for abnormality EXAM: PORTABLE CHEST 1 VIEW COMPARISON:  CXR 08/05/22 FINDINGS: Hazy bibasilar airspace opacities could represent atelectasis or infection. Unchanged cardiac and mediastinal contours. No radiographically apparent displaced rib fractures. Visualized upper abdomen is unremarkable. Likely small bilateral pleural effusions. No pneumothorax IMPRESSION: 1. Hazy bibasilar airspace opacities could represent atelectasis or infection. 2. Likely small bilateral pleural effusions. Electronically Signed   By: Lorenza Cambridge M.D.   On: 11/22/2022 12:45    Pending Labs Unresulted Labs (From admission, onward)     Start     Ordered   11/22/22 1144  Sedimentation rate  Once,   URGENT        11/22/22 1145   11/22/22 1142  Lactic acid, plasma  (Septic presentation on arrival (screening labs, nursing and treatment orders for obvious sepsis))  Now then every 2 hours,   R (with STAT occurrences)      11/22/22 1145   11/22/22 1142  Blood Culture (routine x 2)  (Septic presentation on arrival (screening labs, nursing and treatment orders for obvious sepsis))  BLOOD CULTURE X 2,   STAT      11/22/22 1145   11/22/22 1142  Urinalysis, Routine w  reflex microscopic -Urine, Clean Catch  (Septic presentation on arrival (screening labs, nursing and treatment orders for obvious sepsis))  ONCE - URGENT,   URGENT       Question:  Specimen Source  Answer:  Urine, Clean Catch   11/22/22 1145            Vitals/Pain Today's Vitals   11/22/22 1028 11/22/22 1035 11/22/22 1218 11/22/22 1245  BP: 128/61   (!) 113/58  Pulse: (!) 107   99  Resp: 19   16  Temp: 98.3 F (36.8 C)     TempSrc: Oral     SpO2: 91%   98%  Weight:  65.8 kg    Height:  5\' 2"  (1.575 m)    PainSc:  7  7      Isolation Precautions No active isolations  Medications Medications  0.9 %  sodium chloride infusion ( Intravenous New Bag/Given 11/22/22 1321)  HYDROmorphone (DILAUDID) injection 1 mg (has no administration in time range)  ondansetron (ZOFRAN) injection 4 mg (4 mg Intravenous Given 11/22/22 1215)  ceFEPIme (MAXIPIME) 2 g in sodium chloride 0.9 % 100 mL IVPB (2 g Intravenous New Bag/Given 11/22/22 1323)  vancomycin (VANCOREADY) IVPB 1250 mg/250 mL (has no administration in time range)  enoxaparin (LOVENOX) injection 30 mg (has no administration in time range)  acetaminophen (TYLENOL) tablet 1,000 mg (has no administration in time range)  sodium chloride 0.9 % bolus 1,000 mL (0 mLs Intravenous Stopped 11/22/22 1313)  vancomycin (VANCOCIN) IVPB 1000 mg/200 mL premix (  0 mg Intravenous Stopped 11/22/22 1321)  metroNIDAZOLE (FLAGYL) IVPB 500 mg (0 mg Intravenous Stopped 11/22/22 1313)  HYDROmorphone (DILAUDID) injection 1 mg (1 mg Intravenous Given 11/22/22 1216)    Mobility walks     Focused Assessments    R Recommendations: See Admitting Provider Note  Report given to:   Additional Notes:

## 2022-11-22 NOTE — Hospital Course (Addendum)
Vanessa [name] is a 62 y.o. female with pertinent PMH of T2DM, COPD, HTN, and IBS who presents with progressively worsening ulcer on her right foot and is admitted for right foot osteomyelitis complicated by severe peripheral artery disease.  Osteomyelitis of the right foot fourth phalanx Cellulitis of the right foot Nonhealing plantar ulcer on the right foot Peripheral arterial disease Uncontrolled diabetes with an A1c of 9.8 and history of claudication symptoms for over a year and lower extremities. MRI compatible with osteomyelitis in the proximal phalanx of the fourth toe.  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 [facility] unable to study RLE due to iliac occlusion and obesity.  CTA abdomen/pelvis with runoff shows right common iliac artery occlusion with reconstitution of the proximal common femoral artery and diffuse SFA disease, otherwise nonocclusive stenosis in the infrarenal abdominal aorta, left common iliac artery, left SFA, and left popliteal artery.  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  Surgical cultures showed rare Staph simulans, E faecalis, and Prevotella.  These would all be covered by Augmentin and doxycycline which she was transitioned to after being on ceftriaxone.  Blood cultures showed no growth through 5 days.  She remained afebrile, hemodynamically stable, and with stable leukocytosis throughout her admission.  On the day of discharge she was on day 14 of antibiotics.  She will complete a ***day course of antibiotics on ***.  She will follow-up with vascular and podiatry outpatient.  HFmrEF During admission she had a TTE for cardiac clearance in preparation for vascular surgery.  This showed LVEF of 50 to 55%.  She was on maintenance fluids for couple days after surgery and  developed some pulmonary edema.  She required 4-5 L of supplemental oxygen through nasal cannula for a few days but responded well to IV Lasix and was stable for several days prior to discharge without the need for supplemental oxygen.  Does not need outpatient diuretic at this time.  T2DM A1c 10/25/2022 was 10.6 and 9.8 here.  She is on Soliqua 30 units nightly at home which she was recently started on in our clinic.  She does want a Dexcom.  She did not require nearly as much insulin during her inpatient stay but she was intermittently n.p.o. and likely had a different diet than at home.  She was discharged on 10 units of Soliqua nightly with plans to titrate up.   COPD Continued home inhaler equivalents with Breo Ellipta, Incruse Ellipta, and albuterol.  Will optimize with Trelegy on discharge.   Hypertension Predominantly normotensive here and is on lisinopril 10 mg daily at home.  She did have some hypotension after vascular surgery and lisinopril was held for few days and then restarted at 5 mg daily.  Plan to increase this in the outpatient setting if blood pressure is elevated.   Anxiety/depression Tobacco use disorder Unfortunately she had started smoking again after quitting several months ago.  She was counseled on the impact this has on her peripheral artery disease and her healing.  We continued home medications of bupropion 150 mg twice daily, citalopram 40 mg daily, and nicotine patch daily as needed.   HLD Repeat lipid panel showed LDL of 129. Continued home atorvastatin 80 mg daily and added ezetimibe 10 mg daily

## 2022-11-22 NOTE — ED Provider Notes (Signed)
[place] EMERGENCY DEPARTMENT AT [facility] Provider Note   CSN: [id number] Arrival date & time: 11/22/22  1022     History  Chief Complaint  Patient presents with   Recurrent Skin Infections    Vanessa [name] is a 62 y.o. female.  HPI Patient presents for worsening foot wound.  Medical history includes DM, HTN, HLD, anxiety, depression, IBS, COPD.  She is followed by podiatrist, Dr. Ardelle Anton.  She has had a worsening ulcer on her right foot over the past several weeks.  She was seen in podiatry office yesterday.  X-ray showed concern of early osteomyelitis.  She was advised to come to the ED for IV antibiotics.  She will need MRI and vascular workup.  Toe amputation has been discussed with podiatrist.  She was started on Augmentin yesterday.  Patient took antibiotic yesterday.  She has not taken any today.  Current pain is 7/10 in severity.  She denies any new onset of fevers or chills.    Home Medications Prior to Admission medications   Medication Sig Start Date End Date Taking? Authorizing Provider  acetaminophen (TYLENOL) 500 MG tablet Take 1,000 mg by mouth 2 (two) times daily as needed for mild pain or moderate pain.    [provider]  albuterol (VENTOLIN HFA) 108 (90 Base) MCG/ACT inhaler Inhale 2 puffs into the lungs every 6 (six) hours as needed for wheezing or shortness of breath. 09/20/22   Evlyn Kanner, MD  amoxicillin-clavulanate (AUGMENTIN) 875-125 MG tablet Take 1 tablet by mouth 2 (two) times daily. 11/21/22   Vivi Barrack, DPM  atorvastatin (LIPITOR) 80 MG tablet Take 1 tablet (80 mg total) by mouth daily. 11/02/22 01/31/23  Gust Rung, DO  budesonide-formoterol (SYMBICORT) 80-4.5 MCG/ACT inhaler Inhale 2 puffs into the lungs 2 (two) times daily. 10/30/22   Rocky Morel, DO  buPROPion [facility] SR) 150 MG 12 hr tablet Take 1 tablet (150 mg total) by mouth 2 (two) times daily. 10/25/22   Adron Bene, MD  cetirizine (ZYRTEC) 10 MG  tablet Take 1 tablet (10 mg total) by mouth daily. 05/29/22   Rocky Morel, DO  citalopram (CELEXA) 40 MG tablet Take 1 tablet (40 mg total) by mouth daily. 05/29/22   Rocky Morel, DO  famotidine (PEPCID) 20 MG tablet Take 1 tablet (20 mg total) by mouth 2 (two) times daily. 08/09/22   Rocky Morel, DO  glucose blood test strip Use as instructed 10/12/16   Geralyn Corwin Ratliff, DO  guaiFENesin (ROBITUSSIN) 100 MG/5ML liquid Take 5 mLs by mouth every 4 (four) hours. 08/09/22   Rocky Morel, DO  Insulin Glargine-Lixisenatide (SOLIQUA) 100-33 UNT-MCG/ML SOPN Inject 20 Units into the skin daily. 05/29/22   Rocky Morel, DO  Insulin Pen Needle (PEN NEEDLES) 31G X 5 MM MISC 1 each by Does not apply route daily. Use to inject insulin 08/09/22   Rocky Morel, DO  lisinopril (ZESTRIL) 10 MG tablet Take 1 tablet (10 mg total) by mouth daily. 05/29/22   Rocky Morel, DO  tiotropium (SPIRIVA HANDIHALER) 18 MCG inhalation capsule PLACE ONE CAPSULE INTO INHALER AND INHALE DAILY Patient taking differently: Place 18 mcg into inhaler and inhale daily. 02/20/22   Evlyn Kanner, MD      Allergies    Jardiance [empagliflozin], Latex, and Avandia [rosiglitazone]    Review of Systems   Review of Systems  Skin:  Positive for wound.  All other systems reviewed and are negative.   Physical Exam Updated Vital Signs BP  128/61   Pulse (!) 107   Temp 98.3 F (36.8 C) (Oral)   Resp 19   Ht 5\' 2"  (1.575 m)   Wt 65.8 kg   LMP 12/24/2013   SpO2 91%   BMI 26.52 kg/m  Physical Exam Vitals and nursing note reviewed.  Constitutional:      General: She is not in acute distress.    Appearance: Normal appearance. She is well-developed. She is not ill-appearing, toxic-appearing or diaphoretic.  HENT:     Head: Normocephalic and atraumatic.     Right Ear: External ear normal.     Left Ear: External ear normal.     Nose: Nose normal.     Mouth/Throat:     Mouth: Mucous membranes are moist.   Eyes:     Extraocular Movements: Extraocular movements intact.     Conjunctiva/sclera: Conjunctivae normal.  Cardiovascular:     Rate and Rhythm: Normal rate and regular rhythm.  Pulmonary:     Effort: Pulmonary effort is normal. No respiratory distress.  Abdominal:     General: There is no distension.     Palpations: Abdomen is soft.  Musculoskeletal:        General: No swelling.     Cervical back: Normal range of motion and neck supple.  Skin:    General: Skin is warm and dry.     Capillary Refill: Capillary refill takes less than 2 seconds.     Comments: Wound to area of fifth MTP on right foot  Neurological:     General: No focal deficit present.     Mental Status: She is alert and oriented to person, place, and time.  Psychiatric:        Mood and Affect: Mood normal.        Behavior: Behavior normal.        ED Results / Procedures / Treatments   Labs (all labs ordered are listed, but only abnormal results are displayed) Labs Reviewed  CBC WITH DIFFERENTIAL/PLATELET - Abnormal; Notable for the following components:      Result Value   WBC 14.7 (*)    RBC 5.49 (*)    Hemoglobin 16.0 (*)    HCT 50.3 (*)    RDW 15.8 (*)    Platelets 543 (*)    Neutro Abs 11.0 (*)    All other components within normal limits  BASIC METABOLIC PANEL - Abnormal; Notable for the following components:   Chloride 97 (*)    Glucose, Bld 194 (*)    BUN 6 (*)    All other components within normal limits  CULTURE, BLOOD (ROUTINE X 2)  CULTURE, BLOOD (ROUTINE X 2)  LACTIC ACID, PLASMA  LACTIC ACID, PLASMA  PROTIME-INR  URINALYSIS, ROUTINE W REFLEX MICROSCOPIC  SEDIMENTATION RATE  C-REACTIVE PROTEIN  HEPATIC FUNCTION PANEL  MAGNESIUM    EKG None  Radiology No results found.  Procedures Procedures    Medications Ordered in ED Medications  0.9 %  sodium chloride infusion (has no administration in time range)  sodium chloride 0.9 % bolus 1,000 mL (1,000 mLs Intravenous New  Bag/Given 11/22/22 1208)  vancomycin (VANCOCIN) IVPB 1000 mg/200 mL premix (1,000 mg Intravenous New Bag/Given 11/22/22 1215)  metroNIDAZOLE (FLAGYL) IVPB 500 mg (500 mg Intravenous New Bag/Given 11/22/22 1211)  HYDROmorphone (DILAUDID) injection 1 mg (has no administration in time range)  ondansetron (ZOFRAN) injection 4 mg (4 mg Intravenous Given 11/22/22 1215)  HYDROmorphone (DILAUDID) injection 1 mg (1 mg Intravenous Given 11/22/22 1216)  ED Course/ Medical Decision Making/ A&P                             Medical Decision Making Amount and/or Complexity of Data Reviewed Labs: ordered. Radiology: ordered. ECG/medicine tests: ordered.  Risk Prescription drug management.   This patient presents to the ED for concern of foot wound, this involves an extensive number of treatment options, and is a complaint that carries with it a high risk of complications and morbidity.  The differential diagnosis includes diabetic ulcer, osteomyelitis, septic arthritis, cellulitis, gangrene   Co morbidities that complicate the patient evaluation  DM, HTN, HLD, anxiety, depression, IBS, COPD   Additional history obtained:  Additional history obtained from N/A External records from outside source obtained and reviewed including EMR   Lab Tests:  I Ordered, and personally interpreted labs.  The pertinent results include: Leukocytosis is present.   Imaging Studies ordered:  I ordered imaging studies including chest x-ray, MRI of right foot I independently visualized and interpreted imaging which showed (pending at time of admission) I agree with the radiologist interpretation   Cardiac Monitoring: / EKG:  The patient was maintained on a cardiac monitor.  I personally viewed and interpreted the cardiac monitored which showed an underlying rhythm of: Sinus rhythm   Consultations Obtained:  I requested consultation with the vascular surgeon,  and discussed lab and imaging findings as well  as pertinent plan - they recommend: Will see in consult   Problem List / ED Course / Critical interventions / Medication management  Patient presents for worsening wound to right foot over the past several weeks.  She was seen by podiatry yesterday and there is concern of osteomyelitis.  On arrival in the ED, patient is overall well-appearing.  Vital signs are notable for mild area.  Current pain is 7/10 in severity.  Dilaudid was ordered for analgesia.  IV fluids and antibiotics were ordered for empiric treatment of osteomyelitis.  Laboratory workup was initiated.  MRI study was ordered.  I spoke with vascular surgeon who will see the patient in consult.  Patient was admitted to medicine for further management. I ordered medication including IV fluids for duration; antibiotics for treatment of osteomyelitis; Dilaudid for analgesia Reevaluation of the patient after these medicines showed that the patient improved I have reviewed the patients home medicines and have made adjustments as needed   Social Determinants of Health:  Has access to outpatient care        Final Clinical Impression(s) / ED Diagnoses Final diagnoses:  Wound, open, foot, right, initial encounter    Rx / DC Orders ED Discharge Orders     None         Gloris Manchester, MD 11/22/22 1242

## 2022-11-22 NOTE — Progress Notes (Addendum)
[facility] NOTE   MRN : [id number]  Reason for Consult: right foot tissue loss Referring Physician: ED  History of Present Illness: 62 y/o female with 3-4 week history of right foot non healing wounds.  She states she had a piece of dead skin on the foot and pulled it off.  It has progressed and become bigger.  She denies fever and chills.  She has minimal pain.  She denies previous non healing wounds.  She does describe buttock and thigh claudication as well as calf claudication for more than a year.  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  She was seen by DR. Wagoner DPM 11/21/22.  X-rays obtained and reviewed. Radiodensity is noted on the fifth proximal phalanx as well as the metatarsal head concerning for early osteomyelitis. He suggested to go to the ED for work up and IV antibiotics.      Past medical history includes:   hyperlipidemia, HTN, COPD.  She denies CAD.  Tobacco abuse daily.       Current Facility-Administered Medications  Medication Dose Route Frequency Provider Last Rate Last Admin   0.9 %  sodium chloride infusion   Intravenous Continuous Gloris Manchester, MD 150 mL/hr at 11/22/22 1321 New Bag at 11/22/22 1321   acetaminophen (TYLENOL) tablet 1,000 mg  1,000 mg Oral Q8H Rocky Morel, DO       albuterol (PROVENTIL) (2.5 MG/3ML) 0.083% nebulizer solution 2.5 mg  2.5 mg Nebulization Q6H PRN Reome, Earle J, RPH       [START ON 11/23/2022] atorvastatin (LIPITOR) tablet 80 mg  80 mg Oral Daily Rocky Morel, DO       buPROPion [facility] SR) 12 hr tablet 150 mg  150 mg Oral BID Rocky Morel, DO       ceFEPIme (MAXIPIME) 2 g in sodium chloride 0.9 % 100 mL IVPB  2 g Intravenous Q8H Rumbarger, Faye Ramsay, RPH   Stopped at 11/22/22 1355   [START ON 11/23/2022] citalopram (CELEXA) tablet 40 mg  40 mg Oral Daily Rocky Morel, DO       [START ON 11/23/2022] enoxaparin (LOVENOX) injection 40 mg  40 mg Subcutaneous Q24H Reome, Earle J,  RPH       [START ON 11/23/2022] fluticasone furoate-vilanterol (BREO ELLIPTA) 100-25 MCG/ACT 1 puff  1 puff Inhalation Daily Rocky Morel, DO       HYDROmorphone (DILAUDID) injection 1 mg  1 mg Intravenous Q2H PRN Gloris Manchester, MD       insulin aspart (novoLOG) injection 0-15 Units  0-15 Units Subcutaneous Q4H Rocky Morel, DO       insulin glargine-yfgn [facility]) injection 15 Units  15 Units Subcutaneous QHS Rocky Morel, DO       nicotine (NICODERM CQ - dosed in mg/24 hours) patch 21 mg  21 mg Transdermal Daily PRN Rocky Morel, DO       ondansetron [facility]) injection 4 mg  4 mg Intravenous Q6H PRN Gloris Manchester, MD   4 mg at 11/22/22 1215   [START ON 11/23/2022] umeclidinium bromide (INCRUSE ELLIPTA) 62.5 MCG/ACT 1 puff  1 puff Inhalation Daily Reymundo Poll, MD       Melene Muller ON 11/23/2022] vancomycin (VANCOREADY) IVPB 1250 mg/250 mL  1,250 mg Intravenous Q24H Rumbarger, Rachel L, RPH        Pt meds include: Statin :Yes Betablocker: No ASA: No Other anticoagulants/antiplatelets: None  Past Medical History:  Diagnosis Date   Asthma    No PFT performed  Blood transfusion without reported diagnosis    has to donate blood due to having "thick" blood.   COPD (chronic obstructive pulmonary disease) (HCC)    Depression    Diabetes mellitus 2002   GERD (gastroesophageal reflux disease)    Helicobacter pylori (H. pylori) infection    s/p triple therapy   Hepatic hemangioma    History of kidney stones    Hyperlipidemia    Hypertension    Hypertriglyceridemia    Panic attacks    mostly Agaraphobia   Pneumonia    Ventral hernia     Past Surgical History:  Procedure Laterality Date   CESAREAN SECTION     with 3 children   COLONOSCOPY  2018   OVARIAN CYST REMOVAL     Age 9   ROBOTIC ASSISTED BILATERAL SALPINGO OOPHERECTOMY Bilateral 11/23/2021   Procedure: XI ROBOTIC ASSISTED BILATERAL SALPINGO OOPHORECTOMY,  MINI LAPAROTOMY;  Surgeon: Carver Fila, MD;   Location: [facility];  Service: Gynecology;  Laterality: Bilateral;   ROBOTIC ASSISTED TOTAL HYSTERECTOMY N/A 11/23/2021   Procedure: XI ROBOTIC ASSISTED TOTAL HYSTERECTOMY, STAGING, CYSTOSCOPY;  Surgeon: Carver Fila, MD;  Location: [facility];  Service: Gynecology;  Laterality: N/A;   TUBAL LIGATION     UPPER GASTROINTESTINAL ENDOSCOPY  2018    Social History Social History   Tobacco Use   Smoking status: Every Day    Packs/day: 1.00    Years: 43.00    Additional pack years: 0.00    Total pack years: 43.00    Types: Cigarettes   Smokeless tobacco: Never   Tobacco comments:    Wants Wellbutrin   Vaping Use   Vaping Use: Never used  Substance Use Topics   Alcohol use: No    Alcohol/week: 0.0 standard drinks of alcohol    Comment: none   Drug use: No    Family History Family History  Problem Relation Age of Onset   Colon polyps Mother        benign   Ovarian cancer Mother 3   Breast cancer Maternal Aunt        dx late 61s   Diabetes Maternal Uncle    Colon cancer Neg Hx    Stomach cancer Neg Hx    Endometrial cancer Neg Hx    Prostate cancer Neg Hx    Pancreatic cancer Neg Hx    Esophageal cancer Neg Hx    Rectal cancer Neg Hx     Allergies  Allergen Reactions   Jardiance [Empagliflozin] Other (See Comments)    Severe yeast infection   Latex Rash   Avandia [Rosiglitazone] Other (See Comments)    Headaches      REVIEW OF SYSTEMS  General: [ ]  Weight loss, [ ]  Fever, [ ]  chills Neurologic: [ ]  Dizziness, [ ]  Blackouts, [ ]  Seizure [ ]  Stroke, [ ]  "Mini stroke", [ ]  Slurred speech, [ ]  Temporary blindness; [ ]  weakness in arms or legs, [ ]  Hoarseness [ ]  Dysphagia Cardiac: [ ]  Chest pain/pressure, [ ]  Shortness of breath at rest [x ] Shortness of breath with exertion, [ ]  Atrial fibrillation or irregular heartbeat  Vascular: [ ]  Pain in legs with walking, [ ]  Pain in legs at rest, [ ]  Pain in legs at night,  [x ] Non-healing ulcer, [ ]  Blood clot in  vein/DVT,   Pulmonary: [ ]  Home oxygen, [ ]  Productive cough, [ ]  Coughing up blood, [ ]  Asthma,  [ ]  Wheezing [ x] COPD  Musculoskeletal:  [ ]  Arthritis, [ ]  Low back pain, [ ]  Joint pain Hematologic: [ ]  Easy Bruising, [ ]  Anemia; [ ]  Hepatitis Gastrointestinal: [ ]  Blood in stool, [ ]  Gastroesophageal Reflux/heartburn, Urinary: [ ]  chronic Kidney disease, [ ]  on HD - [ ]  MWF or [ ]  TTHS, [ ]  Burning with urination, [ ]  Difficulty urinating Skin: [ ]  Rashes, [ x] Wounds Psychological: [x ] Anxiety, [ ]  Depression  Physical Examination Vitals:   11/22/22 1028 11/22/22 1035 11/22/22 1245  BP: 128/61  (!) 113/58  Pulse: (!) 107  99  Resp: 19  16  Temp: 98.3 F (36.8 C)    TempSrc: Oral    SpO2: 91%  98%  Weight:  65.8 kg   Height:  5\' 2"  (1.575 m)    Body mass index is 26.52 kg/m.  General:  WDWN in NAD HENT: WNL Eyes: Pupils equal Pulmonary: normal non-labored breathing , without Rales, rhonchi,  wheezing Cardiac: RRR, without  Murmurs, rubs or gallops; No carotid bruits Abdomen: soft, NT, no masses Skin: ulcers noted;  mild right foot cellulitis;    Vascular Exam/Pulses:Doppler PT B LE left > right, left femoral artery palpable , non palpable right femoral pulses   Musculoskeletal: no muscle wasting or atrophy; no edema  Neurologic: A&O X 3; Appropriate Affect ;  SENSATION: normal; MOTOR FUNCTION: grossly intact and equal Symmetric Speech is fluent/normal   Significant Diagnostic Studies: CBC Lab Results  Component Value Date   WBC 14.7 (H) 11/22/2022   HGB 16.0 (H) 11/22/2022   HCT 50.3 (H) 11/22/2022   MCV 91.6 11/22/2022   PLT 543 (H) 11/22/2022    BMET    Component Value Date/Time   NA 135 11/22/2022 1045   NA 142 09/21/2021 1543   K 3.9 11/22/2022 1045   CL 97 (L) 11/22/2022 1045   CO2 26 11/22/2022 1045   GLUCOSE 194 (H) 11/22/2022 1045   BUN 6 (L) 11/22/2022 1045   BUN 8 09/21/2021 1543   CREATININE 0.52 11/22/2022 1045   CREATININE 0.56  11/04/2021 1019   CREATININE 0.45 (L) 12/03/2014 1542   CALCIUM 9.5 11/22/2022 1045   GFRNONAA >60 11/22/2022 1045   GFRNONAA >60 11/04/2021 1019   GFRNONAA >89 12/03/2014 1542   GFRAA 125 03/11/2020 1644   GFRAA >89 12/03/2014 1542   Estimated Creatinine Clearance: 65.8 mL/min (by C-G formula based on SCr of 0.52 mg/dL).  COAG Lab Results  Component Value Date   INR 1.0 11/22/2022     Non-Invasive Vascular Imaging:  CTA abd/pelvis 11/09/21: There is 60% aortic stenosis of the infrarenal abdominal aorta secondary to noncalcified plaque.  ASSESSMENT/PLAN:  62 y/o for 4 week history of non healing wound right 5th toe/MTP.  There is mild erythema and edema.  She describes buttock, upper thigh, and calf claudication for > 1 year causing her to be sedentary combined with long term tobacco abuse.  She would benefit from angiogram with possible intervention to include possible aotrtic/iliac stenosis with buttock claudication symptoms.    She has had previous abdominal surgery 11/23/21.   NPO past MN order has been placed.    Mosetta Pigeon 11/22/2022 2:30 PM  I agree with the above.  I have seen and evaluated the patient.  Briefly, this is a 62 year old female with 3 to 4-week history of right foot wounds.  She has been seen and evaluated by podiatry and has presented to the emergency department.  She has concerns for early osteomyelitis  on the right.  She does not have a palpable right femoral pulse.  The left femoral pulse is palpable.  She does have monophasic right sided Doppler signals.  She does endorse hip and buttock as well as calf claudication for over a year.  She is a heavy smoker.  I discussed with the patient that she will need angiography to define her anatomy and to intervene if indicated.  This is for limb salvage.  I suspect that this will be a aortoiliac intervention based off prior CT scans as well as a nonpalpable right femoral pulse.  If intervention is indicated,  this will likely require stenting.  She may also have outflow disease which could require stenting and/or atherectomy as this has superior results and angioplasty alone.  I am going to order baseline ABIs.  Unfortunately, the schedule is full tomorrow and so she will be scheduled for Friday.  She will need to be n.p.o. after midnight Thursday night.  She is already on a statin and receiving IV antibiotics.  [name]

## 2022-11-22 NOTE — Progress Notes (Signed)
Pharmacy Antibiotic Note  DASHIRA JHAVERI is a 62 y.o. female admitted on 11/22/2022 with cellulitis and osteomyelitis .  Pharmacy has been consulted for vancomycin and cefepime dosing. Pt is afebrile but WBC is elevated at 14.7. SCr is WNL.   Plan: Vancomycin 1g IV x 1 then 1250mg  IV Q24  Cefepime 2g IV Q8H F/u renal fxn, C&S, clinical status and peak/trough at SS  Height: 5\' 2"  (157.5 cm) Weight: 65.8 kg (145 lb) IBW/kg (Calculated) : 50.1  Temp (24hrs), Avg:98.3 F (36.8 C), Min:98.3 F (36.8 C), Max:98.3 F (36.8 C)  Recent Labs  Lab 11/22/22 1045  WBC 14.7*  CREATININE 0.52    Estimated Creatinine Clearance: 65.8 mL/min (by C-G formula based on SCr of 0.52 mg/dL).    Allergies  Allergen Reactions   Jardiance [Empagliflozin] Other (See Comments)    Severe yeast infection   Latex Rash   Avandia [Rosiglitazone] Other (See Comments)    Headaches     Antimicrobials this admission: Vanc 5/29>> Cefepime 5/29>> Flagyl x 1 5/29  Dose adjustments this admission: N/A  Microbiology results: Pending  Thank you for allowing pharmacy to be a part of this patient's care.  Marvelous Woolford, Drake Leach 11/22/2022 1:12 PM

## 2022-11-22 NOTE — ED Triage Notes (Addendum)
Pt. Stated, Dr. Ardelle Anton sent me here to be admitted for care of the rt. Foot. I have an ulcer on bottom . Oxygen is low sometimes.

## 2022-11-23 ENCOUNTER — Inpatient Hospital Stay (HOSPITAL_COMMUNITY): Payer: 59

## 2022-11-23 DIAGNOSIS — J449 Chronic obstructive pulmonary disease, unspecified: Secondary | ICD-10-CM | POA: Diagnosis not present

## 2022-11-23 DIAGNOSIS — I7090 Unspecified atherosclerosis: Secondary | ICD-10-CM

## 2022-11-23 DIAGNOSIS — E1169 Type 2 diabetes mellitus with other specified complication: Secondary | ICD-10-CM | POA: Diagnosis not present

## 2022-11-23 DIAGNOSIS — M86171 Other acute osteomyelitis, right ankle and foot: Secondary | ICD-10-CM | POA: Diagnosis not present

## 2022-11-23 DIAGNOSIS — Z7985 Long-term (current) use of injectable non-insulin antidiabetic drugs: Secondary | ICD-10-CM

## 2022-11-23 DIAGNOSIS — I96 Gangrene, not elsewhere classified: Secondary | ICD-10-CM

## 2022-11-23 DIAGNOSIS — I10 Essential (primary) hypertension: Secondary | ICD-10-CM | POA: Diagnosis not present

## 2022-11-23 DIAGNOSIS — E785 Hyperlipidemia, unspecified: Secondary | ICD-10-CM

## 2022-11-23 LAB — CBC WITH DIFFERENTIAL/PLATELET
Abs Immature Granulocytes: 0.05 10*3/uL (ref 0.00–0.07)
Basophils Absolute: 0.1 10*3/uL (ref 0.0–0.1)
Basophils Relative: 1 %
Eosinophils Absolute: 0.4 10*3/uL (ref 0.0–0.5)
Eosinophils Relative: 3 %
HCT: 39.5 % (ref 36.0–46.0)
Hemoglobin: 12.5 g/dL (ref 12.0–15.0)
Immature Granulocytes: 0 %
Lymphocytes Relative: 17 %
Lymphs Abs: 2.1 10*3/uL (ref 0.7–4.0)
MCH: 28.9 pg (ref 26.0–34.0)
MCHC: 31.6 g/dL (ref 30.0–36.0)
MCV: 91.4 fL (ref 80.0–100.0)
Monocytes Absolute: 0.8 10*3/uL (ref 0.1–1.0)
Monocytes Relative: 6 %
Neutro Abs: 9.6 10*3/uL — ABNORMAL HIGH (ref 1.7–7.7)
Neutrophils Relative %: 73 %
Platelets: 421 10*3/uL — ABNORMAL HIGH (ref 150–400)
RBC: 4.32 MIL/uL (ref 3.87–5.11)
RDW: 15.3 % (ref 11.5–15.5)
WBC: 13 10*3/uL — ABNORMAL HIGH (ref 4.0–10.5)
nRBC: 0 % (ref 0.0–0.2)

## 2022-11-23 LAB — HEMOGLOBIN A1C
Hgb A1c MFr Bld: 9.8 % — ABNORMAL HIGH (ref 4.8–5.6)
Mean Plasma Glucose: 235 mg/dL

## 2022-11-23 LAB — GLUCOSE, CAPILLARY
Glucose-Capillary: 109 mg/dL — ABNORMAL HIGH (ref 70–99)
Glucose-Capillary: 113 mg/dL — ABNORMAL HIGH (ref 70–99)
Glucose-Capillary: 118 mg/dL — ABNORMAL HIGH (ref 70–99)
Glucose-Capillary: 138 mg/dL — ABNORMAL HIGH (ref 70–99)
Glucose-Capillary: 78 mg/dL (ref 70–99)

## 2022-11-23 LAB — RENAL FUNCTION PANEL
Albumin: 2.6 g/dL — ABNORMAL LOW (ref 3.5–5.0)
Anion gap: 8 (ref 5–15)
BUN: 7 mg/dL — ABNORMAL LOW (ref 8–23)
CO2: 25 mmol/L (ref 22–32)
Calcium: 8.4 mg/dL — ABNORMAL LOW (ref 8.9–10.3)
Chloride: 101 mmol/L (ref 98–111)
Creatinine, Ser: 0.47 mg/dL (ref 0.44–1.00)
GFR, Estimated: >60 mL/min (ref >60)
Glucose, Bld: 97 mg/dL (ref 70–99)
Phosphorus: 4.3 mg/dL (ref 2.5–4.6)
Potassium: 4.4 mmol/L (ref 3.5–5.1)
Sodium: 134 mmol/L — ABNORMAL LOW (ref 135–145)

## 2022-11-23 MED ORDER — INSULIN ASPART 100 UNIT/ML IJ SOLN
0.0000 [IU] | Freq: Three times a day (TID) | INTRAMUSCULAR | Status: DC
  Administered 2022-11-25: 3 [IU] via SUBCUTANEOUS
  Administered 2022-11-27: 2 [IU] via SUBCUTANEOUS
  Administered 2022-11-30: 8 [IU] via SUBCUTANEOUS
  Administered 2022-12-01 (×2): 3 [IU] via SUBCUTANEOUS
  Administered 2022-12-02: 11 [IU] via SUBCUTANEOUS
  Administered 2022-12-03 (×2): 3 [IU] via SUBCUTANEOUS
  Administered 2022-12-03: 8 [IU] via SUBCUTANEOUS
  Administered 2022-12-04: 3 [IU] via SUBCUTANEOUS

## 2022-11-23 NOTE — Inpatient Diabetes Management (Addendum)
Inpatient Diabetes Program Recommendations  AACE/ADA: New Consensus Statement on Inpatient Glycemic Control (2015)  Target Ranges:  Prepandial:   less than 140 mg/dL      Peak postprandial:   less than 180 mg/dL (1-2 hours)      Critically ill patients:  140 - 180 mg/dL   Lab Results  Component Value Date   GLUCAP 118 (H) 11/23/2022   HGBA1C 9.8 (H) 11/22/2022    Review of Glycemic Control  Latest Reference Range & Units 11/22/22 17:27 11/22/22 20:12 11/23/22 00:26 11/23/22 04:21 11/23/22 12:13  Glucose-Capillary 70 - 99 mg/dL 409 (H) 811 (H) 914 (H) 78 118 (H)   Diabetes history: DM 2 Outpatient Diabetes medications: Soliqua 30 units Daily Current orders for Inpatient glycemic control:  Semglee 15 units qhs Novolog 0-15 units Q4 hours  A1c 9.8% on 5/29 down from 10.6% on 5/1  Glucose trends look okay currently while on a lower dose of insulin compared to what she is on at home.  Spoke with pt on the phone regarding A1c level and glucose control at home. Pt reports being at the doctor several weeks ago She recently got a new glucometer to check her glucose with at home. She normally checks her glucose 1-2 times a day. Pt reports glucose being in the 230/240's when she checks it. Pt reports eating what she wants but within a portion size, pt drinks diet beverages or water. I suggested to pt to call her PCP if she notices her trends to be elevated so adjustments can be made prior to her follow up visit. Pts daughter is inquiring about an insulin pump for pt for better glucose control outpatient. Informed pt of current A1c in the setting of her current diagnosis. Reviewed glucose and A1c goals. Pt has needed supplies at home. Will continue to follow glucose trends while here.   Thanks,  Christena Deem RN, MSN, BC-ADM Inpatient Diabetes Coordinator Team Pager [phone] (8a-5p)

## 2022-11-23 NOTE — Progress Notes (Addendum)
  Progress Note    11/23/2022 6:40 AM Hospital Day 1  Subjective:  sleeping; wakes easily.  Thought her procedure was today.  afebrile  Vitals:   11/23/22 0029 11/23/22 0424  BP: (!) 118/48 (!) 121/51  Pulse: 77 86  Resp: 18 18  Temp: 98.4 F (36.9 C) 98.8 F (37.1 C)  SpO2: 94% 93%    Physical Exam: General:  no distress Lungs:  non labored   CBC    Component Value Date/Time   WBC 14.7 (H) 11/22/2022 1045   RBC 5.49 (H) 11/22/2022 1045   HGB 16.0 (H) 11/22/2022 1045   HGB 17.0 (H) 09/21/2021 1543   HCT 50.3 (H) 11/22/2022 1045   HCT 49.4 (H) 09/21/2021 1543   PLT 543 (H) 11/22/2022 1045   PLT 464 (H) 09/21/2021 1543   MCV 91.6 11/22/2022 1045   MCV 88 09/21/2021 1543   MCH 29.1 11/22/2022 1045   MCHC 31.8 11/22/2022 1045   RDW 15.8 (H) 11/22/2022 1045   RDW 12.1 09/21/2021 1543   LYMPHSABS 2.4 11/22/2022 1045   LYMPHSABS 3.1 09/21/2021 1543   MONOABS 0.8 11/22/2022 1045   EOSABS 0.4 11/22/2022 1045   EOSABS 0.4 09/21/2021 1543   BASOSABS 0.1 11/22/2022 1045   BASOSABS 0.1 09/21/2021 1543    BMET    Component Value Date/Time   NA 135 11/22/2022 1045   NA 142 09/21/2021 1543   K 3.9 11/22/2022 1045   CL 97 (L) 11/22/2022 1045   CO2 26 11/22/2022 1045   GLUCOSE 194 (H) 11/22/2022 1045   BUN 6 (L) 11/22/2022 1045   BUN 8 09/21/2021 1543   CREATININE 0.52 11/22/2022 1045   CREATININE 0.56 11/04/2021 1019   CREATININE 0.45 (L) 12/03/2014 1542   CALCIUM 9.5 11/22/2022 1045   GFRNONAA >60 11/22/2022 1045   GFRNONAA >60 11/04/2021 1019   GFRNONAA >89 12/03/2014 1542   GFRAA 125 03/11/2020 1644   GFRAA >89 12/03/2014 1542    INR    Component Value Date/Time   INR 1.0 11/22/2022 1142    No intake or output data in the 24 hours ending 11/23/22 0640   Assessment/Plan:  62 y.o. female with right foot wound that is non healing  Hospital Day 1  -pt for angiogram tomorrow.  She does not have any questions. -npo/consent/labs ordered.  Talked with  Dr. Geraldo Pitter and pt is npo for angiogram.  Will change diet and make npo for tonight.     Doreatha Massed, PA-C [facility] [phone] 11/23/2022 6:40 AM   I agree with the above.  Will plan for fistulogram tomorrow as the schedule is full today  Wells Tabbitha Janvrin

## 2022-11-23 NOTE — Progress Notes (Signed)
ABI w/ TBI study completed.   Please see CV Proc for preliminary results.   Leonila Speranza, RDMS, RVT  

## 2022-11-23 NOTE — Consult Note (Signed)
PODIATRY CONSULTATION  NAME Vanessa BEADNELL MRN [id number] DOB 08/11/1960 DOA 11/22/2022   Reason for consult: Ulcer right foot.  Osteomyelitis right foot.  PAD right foot Chief Complaint  Patient presents with   Recurrent Skin Infections    Consulting physician: Gloris Manchester MD, [facility] ED  History of present illness: 62 y.o. female PMHx T2DM, COPD, HTN, PAD admitted for worsening wound complicated by ischemia to the right foot. PSxHx 2 pack/day smoker x 40+ yrs.  Patient seen in the podiatry clinic 11/21/2022 and at that time it was recommended that she goes to the emergency department for urgent workup and evaluation for critical limb ischemia with worsening ulcer to the right foot.  Both podiatry and vascular consulted.  Plan for angiogram tomorrow by vein and vascular.  Presenting this evening resting comfortably in bed  Past Medical History:  Diagnosis Date   Asthma    No PFT performed   Blood transfusion without reported diagnosis    has to donate blood due to having "thick" blood.   COPD (chronic obstructive pulmonary disease) (HCC)    Depression    Diabetes mellitus 2002   GERD (gastroesophageal reflux disease)    Helicobacter pylori (H. pylori) infection    s/p triple therapy   Hepatic hemangioma    History of kidney stones    Hyperlipidemia    Hypertension    Hypertriglyceridemia    Panic attacks    mostly Agaraphobia   Pneumonia    Ventral hernia        Latest Ref Rng & Units 11/23/2022    6:35 AM 11/22/2022   10:45 AM 08/08/2022    1:48 AM  CBC  WBC 4.0 - 10.5 K/uL 13.0  14.7  14.5   Hemoglobin 12.0 - 15.0 g/dL 40.9  81.1  91.4   Hematocrit 36.0 - 46.0 % 39.5  50.3  43.2   Platelets 150 - 400 K/uL 421  543  463        Latest Ref Rng & Units 11/23/2022    6:35 AM 11/22/2022   10:45 AM 08/08/2022    1:48 AM  BMP  Glucose 70 - 99 mg/dL 97  782  956   BUN 8 - 23 mg/dL 7  6  12    Creatinine 0.44 - 1.00 mg/dL 2.13  0.86  5.78   Sodium 135 - 145 mmol/L 134  135  135    Potassium 3.5 - 5.1 mmol/L 4.4  3.9  4.3   Chloride 98 - 111 mmol/L 101  97  99   CO2 22 - 32 mmol/L 25  26  27    Calcium 8.9 - 10.3 mg/dL 8.4  9.5  8.5       Physical Exam: General: The patient is alert and oriented x3 in no acute distress.   Dermatology: Doylene Canard appearance of the fifth digit right foot concerning for vascular ischemia.  Well adhered stable eschar noted along the plantar aspect of the forefoot sub 4th and 5th MTP. No drainage or malodor.  Skin is cold to touch.  Vascular: VAS [place] ABI W/WO TBI 11/23/2022 ABI Findings:  +---------+------------------+-----+-----------------------+--------+  Right   Rt Pressure (mmHg)IndexWaveform               Comment   +---------+------------------+-----+-----------------------+--------+  Brachial 117                                                     +---------+------------------+-----+-----------------------+--------+  PTA     0                 0.00 unable to obtain signal          +---------+------------------+-----+-----------------------+--------+  DP      0                 0.00 unable to obtain signal          +---------+------------------+-----+-----------------------+--------+  Great Toe0                 0.00 Absent                           +---------+------------------+-----+-----------------------+--------+   +---------+-----------------+-----+----------+-----------------------------  ----+  Left    Lt Pressure      IndexWaveform  Comment                                      (mmHg)                                                              +---------+-----------------+-----+----------+-----------------------------  ----+  Brachial                                 Unable to obtain pressures-                                                  fistula                             +---------+-----------------+-----+----------+-----------------------------  ----+  PTA      48               0.41 monophasic                                    +---------+-----------------+-----+----------+-----------------------------  ----+  DP      0                0.00 absent                                        +---------+-----------------+-----+----------+-----------------------------  ----+  Great Toe41               0.35 Abnormal                                      +---------+-----------------+-----+----------+-----------------------------  ----+  Summary:  Right: Resting right ankle-brachial index indicates critical limb  ischemia. The right toe-brachial index is absent.   Left: Resting left ankle-brachial index indicates severe left lower  extremity arterial disease. The left toe-brachial index is abnormal.    Neurological: Light touch and  protective threshold diminished  Musculoskeletal Exam: No structural deformity noted.  No prior amputations  MR FOOT RIGHT WO CONTRAST 11/22/2022: IMPRESSION: 1. Abnormal marrow edema in the proximal phalanx of the fourth toe, compatible with osteomyelitis. There is also some localized subcutaneous edema below the fourth MTP joint and in the soft tissues of the proximal fourth toe, cellulitis is a distinct possibility. 2. Mild intermetatarsal bursitis between the heads of the first and second metatarsals.  ASSESSMENT/PLAN OF CARE Critical limb ischemia/PAD Ulcer right forefoot with underlying osteomyelitis  -pt plan for angiogram tomorrow. -Patient will likely need wide excisional debridement vs. possible amputation of the fifth and possibly fourth digits pending impressions from Angio tomorrow. Will tentatively plan for over the weekend, likely Sunday, 11/26/2022. -will continue to follow     Thank you for the consult.  Please contact me directly via secure chat with any questions or concerns.     Felecia Shelling, DPM [facility]  Dr. Felecia Shelling, DPM    [address] [place], [place] [address]                Office [phone]  Fax [phone]

## 2022-11-23 NOTE — Progress Notes (Signed)
HD#1 Subjective:   Summary: Vanessa [name] is a 62 y.o. female with pertinent PMH of T2DM, COPD, HTN, and IBS who presents with progressively worsening ulcer on her right foot and is admitted for right foot osteomyelitis.  Stable today without any worsening foot pain.  Pain is still confined to her distal lateral right foot predominantly with some pain radiating up to her ankle.  Objective:  Vital signs in last 24 hours: Vitals:   11/22/22 1706 11/22/22 2000 11/23/22 0029 11/23/22 0424  BP: (!) 96/53 (!) 114/99 (!) 118/48 (!) 121/51  Pulse: 84 80 77 86  Resp: 20 18 18 18   Temp: 97.9 F (36.6 C) 97.8 F (36.6 C) 98.4 F (36.9 C) 98.8 F (37.1 C)  TempSrc:   Oral Oral  SpO2: 95% 95% 94% 93%  Weight:      Height:       Supplemental O2: Nasal Cannula SpO2: 93 % O2 Flow Rate (L/min): 2 L/min   Physical Exam:  Constitutional: Elderly female. In no acute distress. Cardio:Regular rate and rhythm. Pulm: Coarse breath sounds bilaterally with no significant wheezing. Abdomen: Soft, non-tender, non-distended, positive bowel sounds. MSK/skin: Roughly 2 cm area of ulceration on the plantar aspect of the right foot beneath the fourth and fifth toes that extends laterally around the fifth toe base with thick skin surrounding and viable erythematous skin underlying on the edges of the wound.  Significant granulation tissue or purulent material underlying wound.  No midfoot tenderness.  There is mild warmth extending up the right foot and into the ankle but no significant warmth of the leg.  Round subcentimeter erythematous nodule on the medial first MTP on the right foot that is tender to palpation. Neuro:Alert and oriented x3. No focal deficit noted. Psych:Pleasant mood and affect.  Filed Weights   11/22/22 1035  Weight: 65.8 kg     No intake or output data in the 24 hours ending 11/23/22 0611 Net IO Since Admission: No IO data has been entered for this period [11/23/22  0611]  Pertinent Labs:    Latest Ref Rng & Units 11/22/2022   10:45 AM 08/08/2022    1:48 AM 08/07/2022    7:53 AM  CBC  WBC 4.0 - 10.5 K/uL 14.7  14.5  17.1   Hemoglobin 12.0 - 15.0 g/dL 16.1  09.6  04.5   Hematocrit 36.0 - 46.0 % 50.3  43.2  42.9   Platelets 150 - 400 K/uL 543  463  458        Latest Ref Rng & Units 11/22/2022   11:42 AM 11/22/2022   10:45 AM 08/08/2022    1:48 AM  CMP  Glucose 70 - 99 mg/dL  409  811   BUN 8 - 23 mg/dL  6  12   Creatinine 9.14 - 1.00 mg/dL  7.82  9.56   Sodium 213 - 145 mmol/L  135  135   Potassium 3.5 - 5.1 mmol/L  3.9  4.3   Chloride 98 - 111 mmol/L  97  99   CO2 22 - 32 mmol/L  26  27   Calcium 8.9 - 10.3 mg/dL  9.5  8.5   Total Protein 6.5 - 8.1 g/dL 7.3     Total Bilirubin 0.3 - 1.2 mg/dL 0.6     Alkaline Phos 38 - 126 U/L 124     AST 15 - 41 U/L 11     ALT 0 - 44 U/L 10  Assessment/Plan:   Principal Problem:   Osteomyelitis [facility])   Patient Summary: Vanessa [name] is a 62 y.o. female with pertinent PMH of T2DM, COPD, HTN, and IBS who presents with progressively worsening ulcer on her right foot and is admitted for right foot osteomyelitis, on hospital day 1.   Osteomyelitis of the right foot fourth phalanx Cellulitis of the right foot Nonhealing plantar ulcer on the right foot Uncontrolled diabetes with an A1c of 9.8 and history of claudication symptoms for over a year and lower extremities.  MRI compatible with osteomyelitis in the proximal phalanx of the fourth toe.  Started on Augmentin by podiatry and then given vancomycin, cefepime, and metronidazole in the ED. Low suspicion for bacteremia, lactic acid 1.9-1.7.  CRP is elevated at 14.7 but ESR is normal at 22.  Vascular has evaluated the patient and plans for angiography of the right lower extremity tomorrow.  Will ensure podiatry sees her today to understand plan of care.  Unlikely to get useful biopsies after antibiotics and possibly will undergo revascularization procedure  prior to amputation. - Appreciate vascular and podiatry evaluation and recommendations - Right lower extremity angiogram tomorrow - Continue vancomycin and cefepime - Follow blood cultures - Pain control with Tylenol 1000 mg every 8 hours and Dilaudid 1 mg IV every 2 hours as needed - N.p.o. at midnight   T2DM A1c 10/25/2022 was 10.6 and 9.8 here.  She is on Soliqua 30 units nightly at home which she was recently started on in our clinic.  It appears to be having some beneficial effect but unfortunately her uncontrolled diabetes is contributing to her acute presentation. - Semglee 15 units nightly and moderate SSI   COPD Continue supplemental oxygen as needed and home inhaler equivalents with Breo Ellipta, Incruse Ellipta, and as needed albuterol.   Hypertension Normotensive here and is on lisinopril 10 mg daily at home. - lisinopril 10 mg daily   Anxiety/depression Tobacco use disorder Will continue home medications of bupropion and citalopram. - Bupropion 150 mg twice daily, citalopram 40 mg daily, and nicotine patch daily as needed   HLD Continue home atorvastatin 80 mg daily.  Diet: NPO VTE: Enoxaparin Code: Full   Dispo: Anticipated discharge home versus short-term rehab depending on further evaluation and management of osteomyelitis.  Rocky Morel, DO Internal Medicine Resident PGY-1 Pager: [phone]  Please contact the on call pager after 5 pm and on weekends at [phone].

## 2022-11-23 NOTE — TOC Initial Note (Signed)
Transition of Care [facility]) - Initial/Assessment Note    Patient Details  Name: Vanessa [name] MRN: [id number] Date of Birth: 1960-07-18  Transition of Care [facility]) CM/SW Contact:    Janae Bridgeman, RN Phone Number: 11/23/2022, 10:35 AM  Clinical Narrative:                 Cm met with the patient at the bedside to discuss TOC needs.  The patient lives at home with her husband and is independent of ADL's and continues to drive for transportation.  The patient was admitted for Right 5th toe/foot infection - blackened area noted under right 5th toe.  Patient is pending procedure tomorrow for angiogram per daughter in the room.  The patient is currently on room air - patient has nasal cannula at the bedside at 2L/min that she was not wearing during the assessment.  The patient is very pleasant and oriented x 4.  The patient states that she was recently on home oxygen after pneumonia but this has since been discontinued from the home.  DME at the home includes glucometer.  The patient's daughter requested MD be notified that she would like her mom assessed for Dexcom glucose system in outpatient setting if possible.  Dr. Geraldo Pitter notified.  The patient is present smoker - resources provided in the AVS.  No TOC needs at this time - I will continue to follow the patient for needs as patient progresses.  Expected Discharge Plan: Home/Self Care Barriers to Discharge: Continued Medical Work up   Patient Goals and CMS Choice Patient states their goals for this hospitalization and ongoing recovery are:: To return home CMS Medicare.gov Compare Post Acute Care list provided to:: Patient Choice offered to / list presented to : Patient [place] ownership interest in [facility].provided to:: Patient    Expected Discharge Plan and Services   Discharge Planning Services: CM Consult Post Acute Care Choice: Resumption of Svcs/PTA Provider                                         Prior Living Arrangements/Services     Patient language and need for interpreter reviewed:: Yes Do you feel safe going back to the place where you live?: Yes      Need for Family Participation in Patient Care: Yes (Comment) Care giver support system in place?: Yes (comment)   Criminal Activity/Legal Involvement Pertinent to Current Situation/Hospitalization: No - Comment as needed  Activities of Daily Living Home Assistive Devices/Equipment: None ADL Screening (condition at time of admission) Patient's cognitive ability adequate to safely complete daily activities?: Yes Is the patient deaf or have difficulty hearing?: No Does the patient have difficulty seeing, even when wearing glasses/contacts?: No Does the patient have difficulty concentrating, remembering, or making decisions?: No Patient able to express need for assistance with ADLs?: Yes Does the patient have difficulty dressing or bathing?: No Independently performs ADLs?: Yes (appropriate for developmental age) Does the patient have difficulty walking or climbing stairs?: No Weakness of Legs: None Weakness of Arms/Hands: None  Permission Sought/Granted Permission sought to share information with : Case Manager, Family Supports Permission granted to share information with : Yes, Verbal Permission Granted        Permission granted to share info w Relationship: family at the bedside     Emotional Assessment Appearance:: Appears stated age Attitude/Demeanor/Rapport: Gracious Affect (typically observed): Accepting  Orientation: : Oriented to Self, Oriented to Place, Oriented to  Time, Oriented to Situation Alcohol / Substance Use: Tobacco Use Psych Involvement: No (comment)  Admission diagnosis:  Osteomyelitis (HCC) [M86.9] Wound, open, foot, right, initial encounter [S91.301A] Patient Active Problem List   Diagnosis Date Noted   Osteomyelitis (HCC) 11/22/2022   Acute respiratory failure with hypoxia (HCC)  08/05/2022   Multifocal pneumonia 08/05/2022   HTN (hypertension) 08/05/2022   Claudication of lower extremity (HCC) 05/31/2022   Genetic testing 01/02/2022   Ovarian cancer (HCC) 12/02/2021   Seasonal allergies 09/23/2021   Pelvic mass 05/12/2020   Elevated CEA 05/12/2020   Polycythemia, 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   Umbilical hernia without obstruction and without gangrene 08/09/2017   Essential hypertension, benign 03/11/2014   Hepatic steatosis 05/30/2013   Irritable bowel syndrome (IBS) 01/09/2013   Healthcare maintenance 01/09/2013   Baker's cyst of knee 03/20/2011   COPD with acute exacerbation (HCC) 12/01/2010   Anxiety and depression 07/23/2008   Tobacco dependence 10/23/2007   HLD (hyperlipidemia) 06/14/2006   ASTHMA 06/14/2006   GERD 06/14/2006   Diabetes mellitus, type II, insulin dependent (HCC) 06/26/2002   PCP:  Evlyn Kanner, MD Pharmacy:   [facility] - [address] ([place]), [place] - [address] [place] ([place]) [place] [address] Phone: [phone] Fax: [phone]  [facility] - [place], [place] - [address] [place] [address] Phone: [phone] Fax: [phone]     Social Determinants of Health (SDOH) Social History: SDOH Screenings   Food Insecurity: No Food Insecurity (11/22/2022)  Housing: Low Risk  (11/22/2022)  Transportation Needs: No Transportation Needs (11/22/2022)  Utilities: Not At Risk (11/22/2022)  Depression (PHQ2-9): High Risk (10/25/2022)  Social Connections: Moderately Isolated (10/25/2022)  Tobacco Use: High Risk (10/25/2022)   SDOH Interventions:     Readmission Risk Interventions    11/23/2022   10:35 AM 11/22/2022    4:12 PM  Readmission Risk Prevention Plan  Transportation Screening Complete Complete  PCP or Specialist Appt within 5-7 Days Complete Complete  Home Care Screening Complete Complete  Medication Review (RN CM) Complete  Complete

## 2022-11-24 ENCOUNTER — Other Ambulatory Visit (HOSPITAL_COMMUNITY): Payer: Self-pay

## 2022-11-24 ENCOUNTER — Encounter (HOSPITAL_COMMUNITY)
Admission: EM | Disposition: A | Discharge status: Home / Self Care | Payer: Self-pay | Source: Home / Self Care | Attending: Internal Medicine

## 2022-11-24 DIAGNOSIS — E1169 Type 2 diabetes mellitus with other specified complication: Secondary | ICD-10-CM | POA: Diagnosis not present

## 2022-11-24 DIAGNOSIS — I70202 Unspecified atherosclerosis of native arteries of extremities, left leg: Secondary | ICD-10-CM

## 2022-11-24 DIAGNOSIS — I70235 Atherosclerosis of native arteries of right leg with ulceration of other part of foot: Secondary | ICD-10-CM

## 2022-11-24 DIAGNOSIS — J449 Chronic obstructive pulmonary disease, unspecified: Secondary | ICD-10-CM | POA: Diagnosis not present

## 2022-11-24 DIAGNOSIS — M86171 Other acute osteomyelitis, right ankle and foot: Secondary | ICD-10-CM | POA: Diagnosis not present

## 2022-11-24 DIAGNOSIS — I10 Essential (primary) hypertension: Secondary | ICD-10-CM | POA: Diagnosis not present

## 2022-11-24 HISTORY — PX: ABDOMINAL AORTOGRAM W/LOWER EXTREMITY: CATH118223

## 2022-11-24 LAB — CBC
HCT: 39.9 % (ref 36.0–46.0)
Hemoglobin: 12.6 g/dL (ref 12.0–15.0)
MCH: 29.4 pg (ref 26.0–34.0)
MCHC: 31.6 g/dL (ref 30.0–36.0)
MCV: 93 fL (ref 80.0–100.0)
Platelets: 410 10*3/uL — ABNORMAL HIGH (ref 150–400)
RBC: 4.29 MIL/uL (ref 3.87–5.11)
RDW: 15.1 % (ref 11.5–15.5)
WBC: 11.5 10*3/uL — ABNORMAL HIGH (ref 4.0–10.5)
nRBC: 0 % (ref 0.0–0.2)

## 2022-11-24 LAB — GLUCOSE, CAPILLARY
Glucose-Capillary: 110 mg/dL — ABNORMAL HIGH (ref 70–99)
Glucose-Capillary: 80 mg/dL (ref 70–99)
Glucose-Capillary: 84 mg/dL (ref 70–99)
Glucose-Capillary: 67 mg/dL — ABNORMAL LOW (ref 70–99)
Glucose-Capillary: 106 mg/dL — ABNORMAL HIGH (ref 70–99)

## 2022-11-24 LAB — CULTURE, BLOOD (ROUTINE X 2): Special Requests: ADEQUATE

## 2022-11-24 LAB — BASIC METABOLIC PANEL
Anion gap: 8 (ref 5–15)
BUN: 8 mg/dL (ref 8–23)
CO2: 25 mmol/L (ref 22–32)
Calcium: 8.6 mg/dL — ABNORMAL LOW (ref 8.9–10.3)
Chloride: 99 mmol/L (ref 98–111)
Creatinine, Ser: 0.39 mg/dL — ABNORMAL LOW (ref 0.44–1.00)
GFR, Estimated: >60 mL/min (ref >60)
Glucose, Bld: 92 mg/dL (ref 70–99)
Potassium: 3.9 mmol/L (ref 3.5–5.1)
Sodium: 132 mmol/L — ABNORMAL LOW (ref 135–145)

## 2022-11-24 SURGERY — ABDOMINAL AORTOGRAM W/LOWER EXTREMITY
Anesthesia: LOCAL

## 2022-11-24 MED ORDER — MIDAZOLAM HCL 2 MG/2ML IJ SOLN
INTRAMUSCULAR | Status: AC
  Filled 2022-11-24: qty 2

## 2022-11-24 MED ORDER — LABETALOL HCL 5 MG/ML IV SOLN: 10.0000 mg | INTRAVENOUS | Status: DC | PRN

## 2022-11-24 MED ORDER — HEPARIN (PORCINE) IN NACL 1000-0.9 UT/500ML-% IV SOLN
INTRAVENOUS | Status: DC | PRN
  Administered 2022-11-24 (×2): 500 mL

## 2022-11-24 MED ORDER — LIDOCAINE HCL (PF) 1 % IJ SOLN
INTRAMUSCULAR | Status: DC | PRN
  Administered 2022-11-24 (×2): 15 mL

## 2022-11-24 MED ORDER — ONDANSETRON HCL 4 MG/2ML IJ SOLN
INTRAMUSCULAR | Status: AC
  Filled 2022-11-24: qty 2

## 2022-11-24 MED ORDER — SODIUM CHLORIDE 0.9% FLUSH: 3.0000 mL | INTRAVENOUS | Status: DC | PRN

## 2022-11-24 MED ORDER — IODIXANOL 320 MG/ML IV SOLN
INTRAVENOUS | Status: DC | PRN
  Administered 2022-11-24: 110 mL

## 2022-11-24 MED ORDER — SODIUM CHLORIDE 0.9 % IV SOLN: 250.0000 mL | INTRAVENOUS | Status: DC | PRN

## 2022-11-24 MED ORDER — FENTANYL CITRATE (PF) 100 MCG/2ML IJ SOLN
INTRAMUSCULAR | Status: DC | PRN
  Administered 2022-11-24: 50 ug via INTRAVENOUS
  Administered 2022-11-24 (×2): 25 ug via INTRAVENOUS

## 2022-11-24 MED ORDER — SODIUM CHLORIDE 0.9% FLUSH
3.0000 mL | Freq: Two times a day (BID) | INTRAVENOUS | Status: DC
  Administered 2022-11-25 – 2022-12-05 (×12): 3 mL via INTRAVENOUS

## 2022-11-24 MED ORDER — LIDOCAINE HCL (PF) 1 % IJ SOLN
INTRAMUSCULAR | Status: AC
  Filled 2022-11-24: qty 30

## 2022-11-24 MED ORDER — ONDANSETRON HCL 4 MG/2ML IJ SOLN: 4.0000 mg | Freq: Four times a day (QID) | INTRAMUSCULAR | Status: DC | PRN

## 2022-11-24 MED ORDER — SENNOSIDES-DOCUSATE SODIUM 8.6-50 MG PO TABS
2.0000 | ORAL_TABLET | Freq: Every day | ORAL | Status: DC
  Administered 2022-11-24 – 2022-12-05 (×7): 2 via ORAL
  Filled 2022-11-24 (×9): qty 2

## 2022-11-24 MED ORDER — ONDANSETRON HCL 4 MG/2ML IJ SOLN
INTRAMUSCULAR | Status: DC | PRN
  Administered 2022-11-24: 4 mg via INTRAVENOUS

## 2022-11-24 MED ORDER — SODIUM CHLORIDE 0.9 % WEIGHT BASED INFUSION
1.0000 mL/kg/h | INTRAVENOUS | Status: AC
  Administered 2022-11-24: 1 mL/kg/h via INTRAVENOUS

## 2022-11-24 MED ORDER — ALBUTEROL SULFATE (2.5 MG/3ML) 0.083% IN NEBU
2.5000 mg | INHALATION_SOLUTION | Freq: Every day | RESPIRATORY_TRACT | Status: DC
  Administered 2022-11-25 – 2022-12-06 (×11): 2.5 mg via RESPIRATORY_TRACT
  Filled 2022-11-24 (×14): qty 3

## 2022-11-24 MED ORDER — FENTANYL CITRATE (PF) 100 MCG/2ML IJ SOLN
INTRAMUSCULAR | Status: AC
  Filled 2022-11-24: qty 2

## 2022-11-24 MED ORDER — HYDRALAZINE HCL 20 MG/ML IJ SOLN: 5.0000 mg | INTRAMUSCULAR | Status: DC | PRN

## 2022-11-24 MED ORDER — SODIUM CHLORIDE 0.9 % IV SOLN: INTRAVENOUS | Status: DC

## 2022-11-24 MED ORDER — HYDROMORPHONE HCL 1 MG/ML IJ SOLN
INTRAMUSCULAR | Status: AC
  Filled 2022-11-24: qty 1

## 2022-11-24 MED ORDER — MIDAZOLAM HCL 2 MG/2ML IJ SOLN
INTRAMUSCULAR | Status: DC | PRN
  Administered 2022-11-24 (×2): 1 mg via INTRAVENOUS

## 2022-11-24 MED ORDER — POLYETHYLENE GLYCOL 3350 17 G PO PACK
17.0000 g | PACK | Freq: Every day | ORAL | Status: DC
  Administered 2022-11-24 – 2022-12-05 (×6): 17 g via ORAL
  Filled 2022-11-24 (×9): qty 1

## 2022-11-24 MED ORDER — ACETAMINOPHEN 325 MG PO TABS
650.0000 mg | ORAL_TABLET | ORAL | Status: DC | PRN
  Filled 2022-11-24: qty 2

## 2022-11-24 SURGICAL SUPPLY — 16 items
CATH NAVICROSS ST 65CM (CATHETERS) IMPLANT
CATH OMNI FLUSH 5F 65CM (CATHETERS) IMPLANT
CATHETER NAVICROSS ST 65CM (CATHETERS) ×1
GUIDEWIRE ANGLED .035X150CM (WIRE) IMPLANT
KIT MICROPUNCTURE NIT STIFF (SHEATH) IMPLANT
KIT PV (KITS) ×1 IMPLANT
SHEATH PINNACLE 5F 10CM (SHEATH) IMPLANT
SHEATH PROBE COVER 6X72 (BAG) IMPLANT
STOPCOCK MORSE 400PSI 3WAY (MISCELLANEOUS) IMPLANT
SYR MEDRAD MARK 7 150ML (SYRINGE) ×1 IMPLANT
TRANSDUCER W/STOPCOCK (MISCELLANEOUS) ×1 IMPLANT
TRAY PV CATH (CUSTOM PROCEDURE TRAY) ×1 IMPLANT
TUBING CIL FLEX 10 FLL-RA (TUBING) IMPLANT
WIRE BENTSON .035X145CM (WIRE) IMPLANT
WIRE SHEPHERD 6G .018 (WIRE) IMPLANT
WIRE TORQFLEX AUST .018X40CM (WIRE) IMPLANT

## 2022-11-24 NOTE — TOC Benefit Eligibility Note (Signed)
Patient Product/process development scientist completed.    The patient is currently admitted and upon discharge could be taking Trelegy Ellipta 200-62.5-25 mcg/act Aepb  The current 30 day co-pay is $0.00.   The patient is currently admitted and upon discharge could be taking Engineer, maintenance.  The current 30 day co-pay is $0.00.   The patient is insured through [employer]   This test claim was processed through [employer]- copay amounts may vary at other pharmacies due to [employer], or as the patient moves through the different stages of their insurance plan.  Roland Earl, CPHT Pharmacy Patient Advocate Specialist [facility] Patient Advocate Team Direct Number: [phone]  Fax: [phone]

## 2022-11-24 NOTE — Op Note (Addendum)
DATE OF SERVICE: 11/24/2022  PATIENT:  Vanessa [name]  62 y.o. female  PRE-OPERATIVE DIAGNOSIS:  Atherosclerosis of native arteries of right lower extremity causing ulceration  POST-OPERATIVE DIAGNOSIS:  Same  PROCEDURE:   1) Ultrasound guided left common femoral artery access 2) Aortogram 3) Left lower extremity angiogram  4) Conscious sedation (35 minutes)   SURGEON:  Rande Brunt. Lenell Antu, MD  ASSISTANT: none  ANESTHESIA:   local and IV sedation  ESTIMATED BLOOD LOSS: minimal  LOCAL MEDICATIONS USED:  LIDOCAINE   COUNTS: confirmed correct.  PATIENT DISPOSITION:  PACU - hemodynamically stable.   Delay start of Pharmacological VTE agent (>24hrs) due to surgical blood loss or risk of bleeding: no  INDICATION FOR PROCEDURE: Vanessa [name] is a 61 y.o. female with right lower extremity peripheral arterial disease and ulceration. After careful discussion of risks, benefits, and alternatives the patient was offered angiography. The patient understood and wished to proceed.  OPERATIVE FINDINGS:  Renal arteries patent Diffusely diseased aorta Significant stenosis in terminal aorta (>60%) Completely occluded right iliac system No reconstitution of flow seen in the right femoral arteries, even during prolonged contrast run.  Left iliac arteries severely diseased. Greatest stenosis >75%.  I was unable to obtain micropuncture access of the right common femoral artery because of iliac occlusion and obesity, and so could not study the right lower extremity  Left lower extremity: Common femoral artery:  patent Profunda femoris artery: 50% stenosis with plaque at the origin  Superficial femoral artery:  50% stenosis with plaque at the origin; diffuse disease Popliteal artery: focal 50% stenosis behind the knee Anterior tibial artery: patent to the ankle Tibioperoneal trunk: patent Peroneal artery: patent to the ankle Posterior tibial artery: patent to the ankle Pedal circulation: not well  studied  DESCRIPTION OF PROCEDURE: After identification of the patient in the pre-operative holding area, the patient was transferred to the operating room. The patient was positioned supine on the operating room table. Anesthesia was induced. The groins was prepped and draped in standard fashion. A surgical pause was performed confirming correct patient, procedure, and operative location.  The left groin was anesthetized with subcutaneous injection of 1% lidocaine. Using ultrasound guidance, the left common femoral artery was accessed with micropuncture technique. Fluoroscopy was used to confirm cannulation over the femoral head. The 104F sheath was upsized to 77F.   A Benson wire was advanced into the distal aorta. Over the wire an omni flush catheter was advanced to the level of L2. Aortogram was performed - see above for details.   Retrograde angiography was performed via the left common femoral sheath. See above for details.  Conscious sedation was administered with the use of IV fentanyl and midazolam under continuous physician and nurse monitoring.  Heart rate, blood pressure, and oxygen saturation were continuously monitored.  Total sedation time was 35 minutes  Upon completion of the case instrument and sharps counts were confirmed correct. The patient was transferred to the PACU in good condition. I was present for all portions of the procedure.  PLAN: Needs CT angiogram of abdomen / pelvis with runoff to toes to evaluate for possible bypass targets. Suspect she will need ABF or extra-anatomic bypass to restore flow to right foot. High risk for limb loss and/or mortality based on extensive atherosclerosis and COPD with hypoxemia.  Rande Brunt. Lenell Antu, MD [facility] Phone Number: [phone] 11/24/2022 1:19 PM

## 2022-11-24 NOTE — Progress Notes (Signed)
SITE AREA: left femoral  SITE PRIOR TO REMOVAL:  LEVEL 0  PRESSURE APPLIED FOR: approximately 25 minutes, sheath removed by this RN, manual pressure taken over by Enrique Sack, 2H RN  MANUAL: yes  PATIENT STATUS DURING PULL: stable, 02 decreased with pain med given, [place] remains in place at 4L [place]  POST PULL SITE:  LEVEL 0  POST PULL INSTRUCTIONS GIVEN: yes, drsg x24 hrs, no sitting in hot tubs, bathtubs, or pools x 1 weeks, no running, jumping, step easy on stair and climbing into vehicles  POST PULL PULSES PRESENT: bilateral post tibial pulses dopplerable  DRESSING APPLIED: gauze with tegaderm  BEDREST BEGINS @ 1424  COMMENTS:

## 2022-11-24 NOTE — Progress Notes (Signed)
HD#2 Subjective:   Summary: Vanessa [name] is a 62 y.o. female with pertinent PMH of T2DM, COPD, HTN, and IBS who presents with progressively worsening ulcer on her right foot and is admitted for right foot osteomyelitis.  She is doing well this morning with stable right foot pain. No increase in pain or pain up her leg. She has not had a bowel movement in about 1 week and usually has about 2 per week at home. No abdominal pain.   Objective:  Vital signs in last 24 hours: Vitals:   11/23/22 1138 11/23/22 1623 11/23/22 1940 11/24/22 0401  BP:  (!) 121/44 (!) 106/57 (!) 131/55  Pulse:  91 82 72  Resp:  19 18 18   Temp:  99.9 F (37.7 C) 99 F (37.2 C) 98.4 F (36.9 C)  TempSrc:  Oral Oral Oral  SpO2: 94% (!) 88% 92% 93%  Weight:      Height:       Supplemental O2: Nasal Cannula SpO2: 93 % O2 Flow Rate (L/min): 2 L/min   Physical Exam:  Constitutional: Elderly female. In no acute distress. Cardio:Regular rate and rhythm. Pulm: Coarse breath sounds bilaterally with no significant wheezing. Abdomen: Soft, non-tender, non-distended, positive bowel sounds. MSK/skin: Roughly 2 cm area of ulceration on the plantar aspect of the right foot beneath the fourth and fifth toes that extends laterally around the fifth toe base with thick skin surrounding and viable erythematous skin underlying on the edges of the wound.  Significant granulation tissue or purulent material underlying wound.  No midfoot tenderness.  There is mild warmth extending up the right foot and into the ankle but no significant warmth of the leg.  Round subcentimeter erythematous nodule on the medial first MTP on the right foot that is tender to palpation. Neuro:Alert and oriented x3. No focal deficit noted. Psych:Pleasant mood and affect.  Filed Weights   11/22/22 1035  Weight: 65.8 kg      Intake/Output Summary (Last 24 hours) at 11/24/2022 0617 Last data filed at 11/23/2022 1853 Gross per 24 hour  Intake 240 ml   Output --  Net 240 ml   Net IO Since Admission: 240 mL [11/24/22 0617]  Pertinent Labs:    Latest Ref Rng & Units 11/23/2022    6:35 AM 11/22/2022   10:45 AM 08/08/2022    1:48 AM  CBC  WBC 4.0 - 10.5 K/uL 13.0  14.7  14.5   Hemoglobin 12.0 - 15.0 g/dL 40.9  81.1  91.4   Hematocrit 36.0 - 46.0 % 39.5  50.3  43.2   Platelets 150 - 400 K/uL 421  543  463        Latest Ref Rng & Units 11/23/2022    6:35 AM 11/22/2022   11:42 AM 11/22/2022   10:45 AM  CMP  Glucose 70 - 99 mg/dL 97   782   BUN 8 - 23 mg/dL 7   6   Creatinine 9.56 - 1.00 mg/dL 2.13   0.86   Sodium 578 - 145 mmol/L 134   135   Potassium 3.5 - 5.1 mmol/L 4.4   3.9   Chloride 98 - 111 mmol/L 101   97   CO2 22 - 32 mmol/L 25   26   Calcium 8.9 - 10.3 mg/dL 8.4   9.5   Total Protein 6.5 - 8.1 g/dL  7.3    Total Bilirubin 0.3 - 1.2 mg/dL  0.6    Alkaline Phos 38 -  126 U/L  124    AST 15 - 41 U/L  11    ALT 0 - 44 U/L  10      Assessment/Plan:   Principal Problem:   Osteomyelitis [facility])   Patient Summary: Vanessa [name] is a 62 y.o. female with pertinent PMH of T2DM, COPD, HTN, and IBS who presents with progressively worsening ulcer on her right foot and is admitted for right foot osteomyelitis, on hospital day 2.   Osteomyelitis of the right foot fourth phalanx Cellulitis of the right foot Nonhealing plantar ulcer on the right foot Uncontrolled diabetes with an A1c of 9.8 and history of claudication symptoms for over a year and lower extremities.  MRI compatible with osteomyelitis in the proximal phalanx of the fourth toe.  - Appreciate vascular and podiatry evaluation and recommendations - Right lower extremity angiogram today, wide excisional debridement versus fourth/fifth digit amputation over the weekend - Continue vancomycin and cefepime - Follow blood cultures - Pain control with Tylenol 1000 mg every 8 hours and Dilaudid 1 mg IV every 2 hours as needed   T2DM A1c 10/25/2022 was 10.6 and 9.8 here.  She is  on Soliqua 30 units nightly at home which she was recently started on in our clinic.  - Semglee 15 units nightly and moderate SSI   COPD Continue supplemental oxygen as needed and home inhaler equivalents with Breo Ellipta, Incruse Ellipta, and albuterol.   Hypertension Normotensive here and is on lisinopril 10 mg daily at home. - lisinopril 10 mg daily   Anxiety/depression Tobacco use disorder Will continue home medications of bupropion and citalopram. - Bupropion 150 mg twice daily, citalopram 40 mg daily, and nicotine patch daily as needed   HLD Continue home atorvastatin 80 mg daily. Repeat lipid panel.   Diet: NPO VTE: Enoxaparin Code: Full   Dispo: Anticipated discharge home versus short-term rehab depending on further evaluation and management of osteomyelitis.  Rocky Morel, DO Internal Medicine Resident PGY-1 Pager: [phone]  Please contact the on call pager after 5 pm and on weekends at [phone].

## 2022-11-24 NOTE — Progress Notes (Addendum)
  Progress Note    11/24/2022 9:05 AM Hospital Day 2  Subjective:  sleeping; wakes easily.  No complaints and ready for procedure.  Tm 99.9 now afebrile  Vitals:   11/24/22 0819 11/24/22 0841  BP:  108/74  Pulse:  85  Resp:  17  Temp:  98.4 F (36.9 C)  SpO2: 95% 94%    Physical Exam: General:  no distress Lungs:  non labored  CBC    Component Value Date/Time   WBC 11.5 (H) 11/24/2022 0536   RBC 4.29 11/24/2022 0536   HGB 12.6 11/24/2022 0536   HGB 17.0 (H) 09/21/2021 1543   HCT 39.9 11/24/2022 0536   HCT 49.4 (H) 09/21/2021 1543   PLT 410 (H) 11/24/2022 0536   PLT 464 (H) 09/21/2021 1543   MCV 93.0 11/24/2022 0536   MCV 88 09/21/2021 1543   MCH 29.4 11/24/2022 0536   MCHC 31.6 11/24/2022 0536   RDW 15.1 11/24/2022 0536   RDW 12.1 09/21/2021 1543   LYMPHSABS 2.1 11/23/2022 0635   LYMPHSABS 3.1 09/21/2021 1543   MONOABS 0.8 11/23/2022 0635   EOSABS 0.4 11/23/2022 0635   EOSABS 0.4 09/21/2021 1543   BASOSABS 0.1 11/23/2022 0635   BASOSABS 0.1 09/21/2021 1543    BMET    Component Value Date/Time   NA 132 (L) 11/24/2022 0536   NA 142 09/21/2021 1543   K 3.9 11/24/2022 0536   CL 99 11/24/2022 0536   CO2 25 11/24/2022 0536   GLUCOSE 92 11/24/2022 0536   BUN 8 11/24/2022 0536   BUN 8 09/21/2021 1543   CREATININE 0.39 (L) 11/24/2022 0536   CREATININE 0.56 11/04/2021 1019   CREATININE 0.45 (L) 12/03/2014 1542   CALCIUM 8.6 (L) 11/24/2022 0536   GFRNONAA >60 11/24/2022 0536   GFRNONAA >60 11/04/2021 1019   GFRNONAA >89 12/03/2014 1542   GFRAA 125 03/11/2020 1644   GFRAA >89 12/03/2014 1542    INR    Component Value Date/Time   INR 1.0 11/22/2022 1142     Intake/Output Summary (Last 24 hours) at 11/24/2022 0905 Last data filed at 11/24/2022 0800 Gross per 24 hour  Intake 240 ml  Output 1 ml  Net 239 ml     Assessment/Plan:  62 y.o. female with right foot wound that is non healing  Hospital Day 2  -plan for angiogram today.  Npo  currently. -pt does not have any questions.    Doreatha Massed, PA-C [facility] [phone] 11/24/2022 9:05 AM   VASCULAR STAFF ADDENDUM: I have independently interviewed and examined the patient. I agree with the above.   Rande Brunt. Lenell Antu, MD [facility] Phone Number: [phone] 11/24/2022 12:58 PM

## 2022-11-25 ENCOUNTER — Inpatient Hospital Stay (HOSPITAL_COMMUNITY): Payer: 59

## 2022-11-25 DIAGNOSIS — I110 Hypertensive heart disease with heart failure: Secondary | ICD-10-CM

## 2022-11-25 DIAGNOSIS — J449 Chronic obstructive pulmonary disease, unspecified: Secondary | ICD-10-CM | POA: Diagnosis not present

## 2022-11-25 DIAGNOSIS — M86171 Other acute osteomyelitis, right ankle and foot: Secondary | ICD-10-CM | POA: Diagnosis not present

## 2022-11-25 DIAGNOSIS — E1169 Type 2 diabetes mellitus with other specified complication: Secondary | ICD-10-CM | POA: Diagnosis not present

## 2022-11-25 LAB — CBC WITH DIFFERENTIAL/PLATELET
Abs Immature Granulocytes: 0.04 10*3/uL (ref 0.00–0.07)
Basophils Absolute: 0.1 10*3/uL (ref 0.0–0.1)
Basophils Relative: 1 %
Eosinophils Absolute: 0.3 10*3/uL (ref 0.0–0.5)
Eosinophils Relative: 2 %
HCT: 38.4 % (ref 36.0–46.0)
Hemoglobin: 12 g/dL (ref 12.0–15.0)
Immature Granulocytes: 0 %
Lymphocytes Relative: 18 %
Lymphs Abs: 2.4 10*3/uL (ref 0.7–4.0)
MCH: 28.8 pg (ref 26.0–34.0)
MCHC: 31.3 g/dL (ref 30.0–36.0)
MCV: 92.3 fL (ref 80.0–100.0)
Monocytes Absolute: 1 10*3/uL (ref 0.1–1.0)
Monocytes Relative: 8 %
Neutro Abs: 9.7 10*3/uL — ABNORMAL HIGH (ref 1.7–7.7)
Neutrophils Relative %: 71 %
Platelets: 398 10*3/uL (ref 150–400)
RBC: 4.16 MIL/uL (ref 3.87–5.11)
RDW: 14.6 % (ref 11.5–15.5)
WBC: 13.6 10*3/uL — ABNORMAL HIGH (ref 4.0–10.5)
nRBC: 0 % (ref 0.0–0.2)

## 2022-11-25 LAB — GLUCOSE, CAPILLARY
Glucose-Capillary: 63 mg/dL — ABNORMAL LOW (ref 70–99)
Glucose-Capillary: 86 mg/dL (ref 70–99)
Glucose-Capillary: 60 mg/dL — ABNORMAL LOW (ref 70–99)
Glucose-Capillary: 65 mg/dL — ABNORMAL LOW (ref 70–99)
Glucose-Capillary: 81 mg/dL (ref 70–99)
Glucose-Capillary: 124 mg/dL — ABNORMAL HIGH (ref 70–99)
Glucose-Capillary: 175 mg/dL — ABNORMAL HIGH (ref 70–99)
Glucose-Capillary: 103 mg/dL — ABNORMAL HIGH (ref 70–99)
Glucose-Capillary: 98 mg/dL (ref 70–99)

## 2022-11-25 LAB — LIPID PANEL
Cholesterol: 184 mg/dL (ref 0–200)
HDL: 25 mg/dL — ABNORMAL LOW (ref >40)
LDL Cholesterol: 129 mg/dL — ABNORMAL HIGH (ref 0–99)
Total CHOL/HDL Ratio: 7.4 RATIO
Triglycerides: 149 mg/dL (ref <150)
VLDL: 30 mg/dL (ref 0–40)

## 2022-11-25 LAB — RENAL FUNCTION PANEL
Albumin: 2.7 g/dL — ABNORMAL LOW (ref 3.5–5.0)
Anion gap: 11 (ref 5–15)
BUN: 8 mg/dL (ref 8–23)
CO2: 25 mmol/L (ref 22–32)
Calcium: 8.6 mg/dL — ABNORMAL LOW (ref 8.9–10.3)
Chloride: 96 mmol/L — ABNORMAL LOW (ref 98–111)
Creatinine, Ser: 0.5 mg/dL (ref 0.44–1.00)
GFR, Estimated: >60 mL/min (ref >60)
Glucose, Bld: 89 mg/dL (ref 70–99)
Phosphorus: 3.3 mg/dL (ref 2.5–4.6)
Potassium: 3.9 mmol/L (ref 3.5–5.1)
Sodium: 132 mmol/L — ABNORMAL LOW (ref 135–145)

## 2022-11-25 LAB — CULTURE, BLOOD (ROUTINE X 2): Culture: NO GROWTH

## 2022-11-25 MED ORDER — IOHEXOL 350 MG/ML SOLN
125.0000 mL | Freq: Once | INTRAVENOUS | Status: AC | PRN
  Administered 2022-11-25: 125 mL via INTRAVENOUS

## 2022-11-25 MED ORDER — OXYCODONE HCL 5 MG PO TABS
5.0000 mg | ORAL_TABLET | ORAL | Status: DC | PRN
  Administered 2022-11-25 – 2022-11-27 (×3): 5 mg via ORAL
  Filled 2022-11-25 (×4): qty 1

## 2022-11-25 MED ORDER — INSULIN GLARGINE-YFGN 100 UNIT/ML ~~LOC~~ SOLN
10.0000 [IU] | Freq: Every day | SUBCUTANEOUS | Status: DC
  Administered 2022-11-26 – 2022-11-27 (×2): 10 [IU] via SUBCUTANEOUS
  Filled 2022-11-25 (×4): qty 0.1

## 2022-11-25 NOTE — Progress Notes (Addendum)
  Progress Note    11/25/2022 8:39 AM 1 Day Post-Op  Subjective:  sleeping when I entered room. No complaints once  I was able to wake her   Vitals:   11/25/22 0305 11/25/22 0756  BP: (!) 125/50   Pulse: 79   Resp: 16   Temp: 98.7 F (37.1 C) 98.1 F (36.7 C)  SpO2: 100%    Physical Exam: Cardiac:  regular Lungs:  non labored  Incisions: left CF access site c/d/I without swelling or hematoma Extremities:  no palpable distal pulses, feet warm. Motor and sensation intact Abdomen: obese, soft Neurologic: alert and oriented  CBC    Component Value Date/Time   WBC 13.6 (H) 11/25/2022 0107   RBC 4.16 11/25/2022 0107   HGB 12.0 11/25/2022 0107   HGB 17.0 (H) 09/21/2021 1543   HCT 38.4 11/25/2022 0107   HCT 49.4 (H) 09/21/2021 1543   PLT 398 11/25/2022 0107   PLT 464 (H) 09/21/2021 1543   MCV 92.3 11/25/2022 0107   MCV 88 09/21/2021 1543   MCH 28.8 11/25/2022 0107   MCHC 31.3 11/25/2022 0107   RDW 14.6 11/25/2022 0107   RDW 12.1 09/21/2021 1543   LYMPHSABS 2.4 11/25/2022 0107   LYMPHSABS 3.1 09/21/2021 1543   MONOABS 1.0 11/25/2022 0107   EOSABS 0.3 11/25/2022 0107   EOSABS 0.4 09/21/2021 1543   BASOSABS 0.1 11/25/2022 0107   BASOSABS 0.1 09/21/2021 1543    BMET    Component Value Date/Time   NA 132 (L) 11/25/2022 0107   NA 142 09/21/2021 1543   K 3.9 11/25/2022 0107   CL 96 (L) 11/25/2022 0107   CO2 25 11/25/2022 0107   GLUCOSE 89 11/25/2022 0107   BUN 8 11/25/2022 0107   BUN 8 09/21/2021 1543   CREATININE 0.50 11/25/2022 0107   CREATININE 0.56 11/04/2021 1019   CREATININE 0.45 (L) 12/03/2014 1542   CALCIUM 8.6 (L) 11/25/2022 0107   GFRNONAA >60 11/25/2022 0107   GFRNONAA >60 11/04/2021 1019   GFRNONAA >89 12/03/2014 1542   GFRAA 125 03/11/2020 1644   GFRAA >89 12/03/2014 1542    INR    Component Value Date/Time   INR 1.0 11/22/2022 1142     Intake/Output Summary (Last 24 hours) at 11/25/2022 0839 Last data filed at 11/25/2022 0750 Gross per 24  hour  Intake 1835.6 ml  Output 1100 ml  Net 735.6 ml     Assessment/Plan:  62 y.o. female is s/p Aortogram, RLE angiogram 1 Day Post-Op   Left common femoral access site without swelling or hematoma Diffusely diseased aorto with significant stenosis in terminal aorta, complete occlusion of right iliac system with no reconstitution visualized.  Left iliac system also severely diseased. No endovascular revascularization options Order placed for CTA aorto-bifem with runoff Surgical planning pending results of CTA  Dory Horn [facility] [phone] 11/25/2022 8:39 AM  I have seen and evaluated the patient. I agree with the PA note as documented above.  62 year old female with critical limb ischemia with tissue loss of the right lower extremity.  Underwent angiogram yesterday with Dr. Lenell Antu and unable to access the right groin with right iliac occlusion.  Access of the left groin showed diseased aorta and diseased left iliac.  Plan is CTA aortofemoral runoff.  This is ordered as her creatinine is stable.  Further operative plans pending CTA.  Cephus Shelling, MD [facility] [place] Office: [phone]

## 2022-11-25 NOTE — Progress Notes (Signed)
Hypoglycemic Event  CBG: 65   Treatment: 8 oz juice/soda  Symptoms: Pale  Follow-up CBG: Time:1056 CBG Result:81  Possible Reasons for Event: Inadequate meal intake  Comments/MD notified:MD Guilloud Notified     Balinda Quails 11/25/2022 10:29 AM

## 2022-11-25 NOTE — Progress Notes (Addendum)
HD#3 Subjective:   Summary: Vanessa [name] is a 62 y.o. female with pertinent PMH of T2DM, COPD, HTN, and IBS who presents with progressively worsening ulcer on her right foot and is admitted for right foot osteomyelitis.  Aortogram by vascular surgery yesterday. Patient evaluated at the bedside laying comfortably in bed. States yesterday was "rough" she was unable to get much sleep last night. She continues to have pain in her right lower extremity but no other acute complaints. She denies any pain around the left CF access site. Discussed with her the findings from her aortogram and plan for imaging today by VVS.  Objective:  Vital signs in last 24 hours: Vitals:   11/24/22 2300 11/25/22 0200 11/25/22 0305 11/25/22 0500  BP: (!) 119/51  (!) 125/50   Pulse: 99  79   Resp: 20 15 16    Temp: 98.2 F (36.8 C)  98.7 F (37.1 C)   TempSrc: Oral  Oral   SpO2: 96%  100%   Weight:    67.8 kg  Height:       Supplemental O2: Nasal Cannula SpO2: 100 % O2 Flow Rate (L/min): 4 L/min   Physical Exam:  General: Pleasant, well-appearing woman laying in bed. No acute distress. CV: RRR. No murmurs, rubs, or gallops. No LE edema Pulmonary: Lungs CTAB. Normal effort. No wheezing or rales. Abdominal: Soft. NT/ND. Normal bowel sounds. No hematoma or tenderness on the left CFA access site. Extremities: Warm to touch. Nonpalpable right DP pulses.  Skin: An area of ulceration with surrounding granulation tissue on the plantar aspect of the R foot beneath the 4th and 5th digits with associated tenderness and warmth. (See media tab) Neuro: A&Ox3. Moves all extremities. Normal sensation. No focal deficit. Psych: Normal mood and affect  Filed Weights   11/22/22 1035 11/25/22 0500  Weight: 65.8 kg 67.8 kg      Intake/Output Summary (Last 24 hours) at 11/25/2022 0616 Last data filed at 11/25/2022 0530 Gross per 24 hour  Intake --  Output 1101 ml  Net -1101 ml   Net IO Since Admission: -861 mL  [11/25/22 0616]  Pertinent Labs:    Latest Ref Rng & Units 11/25/2022    1:07 AM 11/24/2022    5:36 AM 11/23/2022    6:35 AM  CBC  WBC 4.0 - 10.5 K/uL 13.6  11.5  13.0   Hemoglobin 12.0 - 15.0 g/dL 16.1  09.6  04.5   Hematocrit 36.0 - 46.0 % 38.4  39.9  39.5   Platelets 150 - 400 K/uL 398  410  421        Latest Ref Rng & Units 11/25/2022    1:07 AM 11/24/2022    5:36 AM 11/23/2022    6:35 AM  CMP  Glucose 70 - 99 mg/dL 89  92  97   BUN 8 - 23 mg/dL 8  8  7    Creatinine 0.44 - 1.00 mg/dL 4.09  8.11  9.14   Sodium 135 - 145 mmol/L 132  132  134   Potassium 3.5 - 5.1 mmol/L 3.9  3.9  4.4   Chloride 98 - 111 mmol/L 96  99  101   CO2 22 - 32 mmol/L 25  25  25    Calcium 8.9 - 10.3 mg/dL 8.6  8.6  8.4     Assessment/Plan:   Principal Problem:   Osteomyelitis [facility])   Patient Summary: Vanessa [name] is a 62 y.o. female with pertinent PMH of  T2DM, COPD, HTN, and IBS who presents with progressively worsening ulcer on her right foot and is admitted for right foot osteomyelitis and currently pending vascular or surgical intervention, on hospital day 3.  Osteomyelitis of the right foot fourth phalanx Cellulitis of the right foot Nonhealing plantar ulcer on the right foot Uncontrolled diabetes with an A1c of 9.8 and history of claudication symptoms for over a year and lower extremities. MRI compatible with osteomyelitis in the proximal phalanx of the fourth toe.  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 VVS unable to study RLE due to iliac occlusion and obesity. Plan for CT angiogram of the abdomen/pelvis with runoff to toes to evaluate for possible bypass targets. She remains afebrile, stable leukocytosis. Bcx NGTD.  - VVS following, appreciate recs --Podiatry plan to reevaluate after vascular intervention --CT angiogram of abdomen/pelvis with runoff today -  Continue vancomycin and cefepime - Bcx no growth in 2 days - Pain control with Tylenol 1000 mg every 8 hours and Dilaudid 1 mg IV every 2 hours as needed   T2DM A1c 10/25/2022 was 10.6 and 9.8 here.  She is on Soliqua 30 units nightly at home which she was recently started on in our clinic. She has had some low sugars in the 60s due to not eating. We will encourage patient to try to eat more of her meals and back off on her long-acting insulin she continues to have low sugars. - Semglee 15 units nightly and moderate SSI -- Continue CBG monitoring, CM diet   COPD No wheezing on exam.  Placed on 4 L overnight with O2 sats at 100%.  Weaned off to room air this morning. --Continue supplemental O2 for O2 goal 88-92% --Continue home inhaler equivalents with Breo Ellipta, Incruse Ellipta, and albuterol.   Hypertension Normotensive here and is on lisinopril 10 mg daily at home. - Continue lisinopril 10 mg daily   Anxiety/depression Tobacco use disorder Will continue home medications of bupropion and citalopram. - Bupropion 150 mg twice daily, citalopram 40 mg daily, and nicotine patch daily as needed   HLD Repeat lipid panel showed LDL of 129.   --Continue home atorvastatin 80 mg daily.   Diet: Carb-Modified VTE: Enoxaparin Code: Full  Dispo: Anticipated discharge home versus short-term rehab depending on further evaluation and management of osteomyelitis.  Steffanie Rainwater, MD 11/25/2022, 9:32 AM IM Resident, PGY-3 Pager: [phone] Internal Medicine Teaching Service [place] 41:10

## 2022-11-26 ENCOUNTER — Inpatient Hospital Stay (HOSPITAL_COMMUNITY): Payer: 59

## 2022-11-26 DIAGNOSIS — L97519 Non-pressure chronic ulcer of other part of right foot with unspecified severity: Secondary | ICD-10-CM | POA: Diagnosis not present

## 2022-11-26 DIAGNOSIS — Z01818 Encounter for other preprocedural examination: Secondary | ICD-10-CM | POA: Diagnosis not present

## 2022-11-26 DIAGNOSIS — I10 Essential (primary) hypertension: Secondary | ICD-10-CM

## 2022-11-26 DIAGNOSIS — L03115 Cellulitis of right lower limb: Secondary | ICD-10-CM | POA: Diagnosis not present

## 2022-11-26 DIAGNOSIS — M86171 Other acute osteomyelitis, right ankle and foot: Secondary | ICD-10-CM | POA: Diagnosis not present

## 2022-11-26 DIAGNOSIS — E1169 Type 2 diabetes mellitus with other specified complication: Secondary | ICD-10-CM | POA: Diagnosis not present

## 2022-11-26 LAB — ECHOCARDIOGRAM COMPLETE
Area-P 1/2: 4.49 cm2
Calc EF: 51.9 %
Height: 60.2 in
S' Lateral: 3.8 cm
Single Plane A2C EF: 47.6 %
Single Plane A4C EF: 51.7 %
Weight: 2391.55 oz

## 2022-11-26 LAB — CULTURE, BLOOD (ROUTINE X 2)

## 2022-11-26 LAB — VANCOMYCIN, RANDOM: Vancomycin Rm: 8 ug/mL

## 2022-11-26 LAB — GLUCOSE, CAPILLARY
Glucose-Capillary: 103 mg/dL — ABNORMAL HIGH (ref 70–99)
Glucose-Capillary: 93 mg/dL (ref 70–99)
Glucose-Capillary: 93 mg/dL (ref 70–99)
Glucose-Capillary: 117 mg/dL — ABNORMAL HIGH (ref 70–99)
Glucose-Capillary: 132 mg/dL — ABNORMAL HIGH (ref 70–99)

## 2022-11-26 LAB — VANCOMYCIN, PEAK: Vancomycin Pk: 18 ug/mL — ABNORMAL LOW (ref 30–40)

## 2022-11-26 MED ORDER — VANCOMYCIN HCL 1250 MG/250ML IV SOLN
1250.0000 mg | Freq: Two times a day (BID) | INTRAVENOUS | Status: DC
  Administered 2022-11-26 – 2022-11-30 (×8): 1250 mg via INTRAVENOUS
  Filled 2022-11-26 (×8): qty 250

## 2022-11-26 NOTE — Progress Notes (Signed)
Pharmacy Antibiotic Note  Vanessa [name] is a 62 y.o. female admitted on 11/22/2022 with right foot cellulitis and osteomyelitis.  Pharmacy has been consulted for Vancomycin and Cefepime dosing.  Patient is afebrile and white blood cell count has trended down slightly from peak of 14.7k to 13.6k today. Serum creatinine remains mostly unchanged from initial dosing and is at 0.50 mg/dL today. She will likely need vascular intervention and these plans are still being finalized.   Day #4 Vancomycin 1250 mg IV q24h. Vanc peak level 18 mcg/ml after this morning's dose. With low peak level, random level checked late this afternoon, as true trough due 6/3 expected to be very low or undetectable.  Random vanc level = 8 mcg/ml ~5 hours after peak level.  Calculated AUC 154, but hesitant to increase to calculated interval of q8h for target AUCs 400-550.  Plan: Increase Vancomycin 1250 mg IV q24h to q12h. Continue Cefepime 2gm IV q8h. Monitor renal function, culture data, clinical progress. Will recheck Vanc levels later this week if Vanc expected to continue.  Height: 5\' 2"  (157.5 cm) Weight: 67.8 kg (149 lb 7.6 oz) IBW/kg (Calculated) : 50.1  Temp (24hrs), Avg:98.8 F (37.1 C), Min:98 F (36.7 C), Max:99.2 F (37.3 C)  Recent Labs  Lab 11/22/22 1045 11/22/22 1154 11/22/22 1427 11/23/22 0635 11/24/22 0536 11/25/22 0107 11/26/22 1213 11/26/22 1720  WBC 14.7*  --   --  13.0* 11.5* 13.6*  --   --   CREATININE 0.52  --   --  0.47 0.39* 0.50  --   --   LATICACIDVEN  --  1.9 1.7  --   --   --   --   --   VANCOPEAK  --   --   --   --   --   --  18*  --   VANCORANDOM  --   --   --   --   --   --   --  8    Estimated Creatinine Clearance: 66.7 mL/min (by C-G formula based on SCr of 0.5 mg/dL).    Allergies  Allergen Reactions   Jardiance [Empagliflozin] Other (See Comments)    Severe yeast infection   Latex Rash   Avandia [Rosiglitazone] Other (See Comments)    Headaches      Antimicrobials this admission: Vancomycin 5/29>> Cefepime 5/29>> Metronidazole x 1 on 5/29  Dose adjustments this admission: 6/2:  Vanc peak = 18 mcg/ml, random level ~5 hrs later = 8 mcg/ml on Vanc 1250 mg IV q24h > increased to q12h  Microbiology results: 5/29 blood: no growth x 4 days to date  Thank you for allowing pharmacy to be a part of this patient's care.  Dennie Fetters, [place] 11/26/2022 7:36 PM

## 2022-11-26 NOTE — Progress Notes (Signed)
  Echocardiogram 2D Echocardiogram has been performed.  Vanessa [name] 11/26/2022, 4:23 PM

## 2022-11-26 NOTE — Progress Notes (Addendum)
  Progress Note    11/26/2022 9:51 AM 2 Days Post-Op  Subjective:  no major complaints   Vitals:   11/26/22 0500 11/26/22 0909  BP: 132/64 (!) 130/57  Pulse: 86   Resp: 14 20  Temp: 99.2 F (37.3 C) 98 F (36.7 C)  SpO2: 96% 90%   Physical Exam: Cardiac:  regular Lungs:  Non labored, on 4L [place] Extremities:  Left CF access without swelling or hematoma Abdomen:  obese, soft Neurologic: alert and oriented  CBC    Component Value Date/Time   WBC 13.6 (H) 11/25/2022 0107   RBC 4.16 11/25/2022 0107   HGB 12.0 11/25/2022 0107   HGB 17.0 (H) 09/21/2021 1543   HCT 38.4 11/25/2022 0107   HCT 49.4 (H) 09/21/2021 1543   PLT 398 11/25/2022 0107   PLT 464 (H) 09/21/2021 1543   MCV 92.3 11/25/2022 0107   MCV 88 09/21/2021 1543   MCH 28.8 11/25/2022 0107   MCHC 31.3 11/25/2022 0107   RDW 14.6 11/25/2022 0107   RDW 12.1 09/21/2021 1543   LYMPHSABS 2.4 11/25/2022 0107   LYMPHSABS 3.1 09/21/2021 1543   MONOABS 1.0 11/25/2022 0107   EOSABS 0.3 11/25/2022 0107   EOSABS 0.4 09/21/2021 1543   BASOSABS 0.1 11/25/2022 0107   BASOSABS 0.1 09/21/2021 1543    BMET    Component Value Date/Time   NA 132 (L) 11/25/2022 0107   NA 142 09/21/2021 1543   K 3.9 11/25/2022 0107   CL 96 (L) 11/25/2022 0107   CO2 25 11/25/2022 0107   GLUCOSE 89 11/25/2022 0107   BUN 8 11/25/2022 0107   BUN 8 09/21/2021 1543   CREATININE 0.50 11/25/2022 0107   CREATININE 0.56 11/04/2021 1019   CREATININE 0.45 (L) 12/03/2014 1542   CALCIUM 8.6 (L) 11/25/2022 0107   GFRNONAA >60 11/25/2022 0107   GFRNONAA >60 11/04/2021 1019   GFRNONAA >89 12/03/2014 1542   GFRAA 125 03/11/2020 1644   GFRAA >89 12/03/2014 1542    INR    Component Value Date/Time   INR 1.0 11/22/2022 1142     Intake/Output Summary (Last 24 hours) at 11/26/2022 0951 Last data filed at 11/26/2022 0900 Gross per 24 hour  Intake 3356.48 ml  Output 1600 ml  Net 1756.48 ml     Assessment/Plan:  62 y.o. female is s/p Aortogram, RLE  arteriogram 2 Days Post-Op   Left common femoral access site without swelling or hematoma Extensive distal aortic disease with complete occlusion of right iliac system CTA aortobifemoral with runoff showing extensive disease Echo ordered Will consult cardiology for preoperative clearance Discussed with patient need for extensive open revascularization Further surgical planning will defer to Dr. Sharyn Lull. Metro Kung, PA-C [facility] [phone] 11/26/2022 9:51 AM  I have seen and evaluated the patient. I agree with the PA note as documented above.  62 year old female with critical limb ischemia with tissue loss of the right lower extremity.  Unsuccessful endovascular attempt on Friday by Dr. Lenell Antu.  CTA completed yesterday showing multilevel disease including aortoiliac and infrainguinal occlusive disease.  She has very poor functional status at baseline and also has COPD.  Will order echocardiogram today for further risk stratification.  Likely needs open revascularization but need to weigh her risk benefits.  Cephus Shelling, MD [facility] [place] Office: [phone]

## 2022-11-26 NOTE — Progress Notes (Signed)
HD#4 Subjective:   Summary: Vanessa [name] is a 62 y.o. female with pertinent PMH of T2DM, COPD, HTN, and IBS who presents with progressively worsening ulcer on her right foot and is admitted for right foot osteomyelitis.  Doing this morning without any issues overnight.  She is on 4 L through nasal cannula and after returning down she desatted to the mid 80s.  She did not have any subjective symptoms.  She responded to increasing back to 4 L per nasal cannula.  She did not get her COPD inhalers yesterday for an unknown reason.  No new or worsening concerns today.  Objective:  Vital signs in last 24 hours: Vitals:   11/25/22 1625 11/25/22 1734 11/25/22 1942 11/26/22 0500  BP:  (!) 154/61 101/80 132/64  Pulse:   89 86  Resp:   19 14  Temp: 98.3 F (36.8 C)  98.9 F (37.2 C) 99.2 F (37.3 C)  TempSrc: Oral  Oral Oral  SpO2: 90%  90% 96%  Weight:    67.8 kg  Height:    5' 0.2" (1.529 m)   Supplemental O2: Nasal Cannula SpO2: 96 % O2 Flow Rate (L/min): 4 L/min   Physical Exam:  Constitutional: Elderly female. In no acute distress. Cardio:Regular rate and rhythm. Pulm: Coarse breath sounds bilaterally with no significant wheezing. MSK/skin: Roughly 2 cm area of ulceration on the plantar aspect of the right foot beneath the fourth and fifth toes that extends laterally around the fifth toe base with thick skin surrounding and viable erythematous skin underlying on the edges of the wound.  Significant granulation tissue or purulent material underlying wound.  No midfoot tenderness.  There is mild warmth extending up the right foot and into the ankle but no significant warmth of the leg.  Round subcentimeter erythematous nodule on the medial first MTP on the right foot that is tender to palpation. Neuro:Alert and oriented x3. No focal deficit noted.  Filed Weights   11/22/22 1035 11/25/22 0500 11/26/22 0500  Weight: 65.8 kg 67.8 kg 67.8 kg      Intake/Output Summary (Last 24 hours)  at 11/26/2022 0629 Last data filed at 11/25/2022 1600 Gross per 24 hour  Intake 4043.75 ml  Output 600 ml  Net 3443.75 ml   Net IO Since Admission: 2,582.75 mL [11/26/22 0629]  Pertinent Labs:    Latest Ref Rng & Units 11/25/2022    1:07 AM 11/24/2022    5:36 AM 11/23/2022    6:35 AM  CBC  WBC 4.0 - 10.5 K/uL 13.6  11.5  13.0   Hemoglobin 12.0 - 15.0 g/dL 40.9  81.1  91.4   Hematocrit 36.0 - 46.0 % 38.4  39.9  39.5   Platelets 150 - 400 K/uL 398  410  421        Latest Ref Rng & Units 11/25/2022    1:07 AM 11/24/2022    5:36 AM 11/23/2022    6:35 AM  CMP  Glucose 70 - 99 mg/dL 89  92  97   BUN 8 - 23 mg/dL 8  8  7    Creatinine 0.44 - 1.00 mg/dL 7.82  9.56  2.13   Sodium 135 - 145 mmol/L 132  132  134   Potassium 3.5 - 5.1 mmol/L 3.9  3.9  4.4   Chloride 98 - 111 mmol/L 96  99  101   CO2 22 - 32 mmol/L 25  25  25    Calcium 8.9 - 10.3 mg/dL  8.6  8.6  8.4     Assessment/Plan:   Principal Problem:   Osteomyelitis [facility])   Patient Summary: Vanessa [name] is a 62 y.o. female with pertinent PMH of T2DM, COPD, HTN, and IBS who presents with progressively worsening ulcer on her right foot and is admitted for right foot osteomyelitis and currently pending vascular or surgical intervention, on hospital day 4.  Osteomyelitis of the right foot fourth phalanx Cellulitis of the right foot Nonhealing plantar ulcer on the right foot Peripheral arterial disease Uncontrolled diabetes with an A1c of 9.8 and history of claudication symptoms for over a year and lower extremities. MRI compatible with osteomyelitis in the proximal phalanx of the fourth toe.  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 VVS unable to study RLE due to iliac occlusion and obesity.  CTA abdomen/pelvis with runoff shows right common iliac artery occlusion with reconstitution of the proximal  common femoral artery and diffuse SFA disease, otherwise nonocclusive stenosis in the infrarenal abdominal aorta, left common iliac artery, left SFA, and left popliteal artery.  She remains afebrile, stable leukocytosis. Bcx NGTD.  - VVS following, appreciate recs --Podiatry plan to reevaluate after vascular intervention - Continue vancomycin and cefepime - Bcx no growth in 3 days - Pain control with Tylenol 1000 mg every 8 hours and Dilaudid 1 mg IV every 2 hours as needed   T2DM A1c 10/25/2022 was 10.6 and 9.8 here.  She is on Soliqua 30 units nightly at home which she was recently started on in our clinic.  She does want a Dexcom which we will address closer to discharge or on follow-up.  Fasting glucose this morning 103. - Semglee 15 units nightly and moderate SSI -- Continue CBG monitoring, CM diet   COPD On 4 L nasal cannula overnight with saturations 90+.  Attempted to wean to room air this morning with saturations in the mid 80s.  No wheezing or subjective symptoms.  Will add incentive spirometer.  If she has any symptoms or change respiratory exam we will get a chest x-ray. --Continue supplemental O2 for O2 goal 88-92% and incentive spirometer  --Continue home inhaler equivalents with Breo Ellipta, Incruse Ellipta, and albuterol.   Hypertension Normotensive here and is on lisinopril 10 mg daily at home. - Continue lisinopril 10 mg daily   Anxiety/depression Tobacco use disorder Will continue home medications of bupropion and citalopram. - Bupropion 150 mg twice daily, citalopram 40 mg daily, and nicotine patch daily as needed   HLD Repeat lipid panel showed LDL of 129.   --Continue home atorvastatin 80 mg daily.   Diet: Carb-Modified VTE: Enoxaparin Code: Full  Dispo: Anticipated discharge home versus short-term rehab depending on further evaluation and management of osteomyelitis complicated by peripheral arterial disease.  Rocky Morel, DO 11/26/2022, 6:29 AM IM  Resident, PGY-3 Pager: [phone] Internal Medicine Teaching Service [place] 41:10

## 2022-11-26 NOTE — Progress Notes (Signed)
PHARMACIST LIPID MONITORING Vanessa [name] is a 62 y.o. female admitted on 11/22/2022 with osteomyelitis.  Pharmacy has been consulted to optimize lipid-lowering therapy with the indication of secondary prevention for clinical ASCVD.  Recent Labs:  Lipid Panel (last 6 months):   Lab Results  Component Value Date   CHOL 184 11/25/2022   TRIG 149 11/25/2022   HDL 25 (L) 11/25/2022   CHOLHDL 7.4 11/25/2022   VLDL 30 11/25/2022   LDLCALC 129 (H) 11/25/2022    Hepatic function panel (last 6 months):   Lab Results  Component Value Date   AST 11 (L) 11/22/2022   ALT 10 11/22/2022   ALKPHOS 124 11/22/2022   BILITOT 0.6 11/22/2022   BILIDIR <0.1 11/22/2022   IBILI NOT CALCULATED 11/22/2022    SCr (since admission):   Serum creatinine: 0.5 mg/dL 16/10/96 0454 Estimated creatinine clearance: 63.8 mL/min  Current therapy and lipid therapy tolerance Current lipid-lowering therapy: atorvastatin 80 mg once daily  Documented or reported allergies or intolerances to lipid-lowering therapies (if applicable): None  Assessment:   Patient agrees with changes to lipid-lowering therapy and is willing to start ezetimibe 10 mg once daily.   Plan:    1.Statin intensity (high intensity recommended for all patients regardless of the LDL):  No statin changes. The patient is already on a high intensity statin.  2.Add ezetimibe (if any one of the following):   On a high intensity statin with LDL > 70.  3.Refer to lipid clinic:   No  4.Follow-up with:  Primary care provider - Evlyn Kanner, MD  5.Follow-up labs after discharge:  Changes in lipid therapy were made. Check a lipid panel in 8-12 weeks then annually.     Blane Ohara, PharmD  PGY1 Pharmacy Resident

## 2022-11-26 NOTE — Progress Notes (Signed)
Patient O2 Sat is now in the 90s after the breading treatment.

## 2022-11-26 NOTE — Progress Notes (Addendum)
Pharmacy Antibiotic Note  Vanessa [name] is a 62 y.o. female admitted on 11/22/2022 with cellulitis and osteomyelitis .  Pharmacy has been consulted for vancomycin and cefepime dosing.   Patient is afebrile and white blood cell count has trended down slightly from peak of 14.7k to 13.6k today. Serum creatinine remains mostly unchanged from initial dosing and is at 0.50 mg/dL today. She will likely need vascular intervention and these plans are still being finalized.   Plan: Continue vancomycin 1250mg  IV Q24 - plan to adjust per levels on 6/2 and 6/3 Continue cefepime 2g IV Q8H Follow up signs and symptoms of infection, renal function, ability to narrow, and culture data   Height: 5' 0.2" (152.9 cm) Weight: 67.8 kg (149 lb 7.6 oz) IBW/kg (Calculated) : 45.96  Temp (24hrs), Avg:98.5 F (36.9 C), Min:97.9 F (36.6 C), Max:99.2 F (37.3 C)  Recent Labs  Lab 11/22/22 1045 11/22/22 1154 11/22/22 1427 11/23/22 0635 11/24/22 0536 11/25/22 0107  WBC 14.7*  --   --  13.0* 11.5* 13.6*  CREATININE 0.52  --   --  0.47 0.39* 0.50  LATICACIDVEN  --  1.9 1.7  --   --   --      Estimated Creatinine Clearance: 63.8 mL/min (by C-G formula based on SCr of 0.5 mg/dL).    Allergies  Allergen Reactions   Jardiance [Empagliflozin] Other (See Comments)    Severe yeast infection   Latex Rash   Avandia [Rosiglitazone] Other (See Comments)    Headaches     Antimicrobials this admission: Vanc 5/29>> Cefepime 5/29>> Flagyl x 1 5/29  Dose adjustments this admission: N/A  Microbiology results: 11/22/2022 Blood Culture: No growth x 3 days   Thank you for allowing pharmacy to be a part of this patient's care.  Blane Ohara, PharmD  PGY1 Pharmacy Resident

## 2022-11-26 NOTE — Progress Notes (Signed)
Patient on 5 L O2 started having breading difficulties after taking on bite of her food. Husband in the room call for a breading treatment.  Patient O2 saturation decreased to 77 %.  Albuterol 2.5 given. We'll continue to monitor.

## 2022-11-27 ENCOUNTER — Inpatient Hospital Stay (HOSPITAL_COMMUNITY): Payer: 59

## 2022-11-27 ENCOUNTER — Other Ambulatory Visit (HOSPITAL_COMMUNITY): Payer: Self-pay

## 2022-11-27 ENCOUNTER — Encounter (HOSPITAL_COMMUNITY): Payer: Self-pay | Admitting: Vascular Surgery

## 2022-11-27 DIAGNOSIS — J449 Chronic obstructive pulmonary disease, unspecified: Secondary | ICD-10-CM | POA: Diagnosis not present

## 2022-11-27 DIAGNOSIS — I110 Hypertensive heart disease with heart failure: Secondary | ICD-10-CM | POA: Diagnosis not present

## 2022-11-27 DIAGNOSIS — E1169 Type 2 diabetes mellitus with other specified complication: Secondary | ICD-10-CM | POA: Diagnosis not present

## 2022-11-27 DIAGNOSIS — M86171 Other acute osteomyelitis, right ankle and foot: Secondary | ICD-10-CM | POA: Diagnosis not present

## 2022-11-27 LAB — CBC WITH DIFFERENTIAL/PLATELET
Abs Immature Granulocytes: 0.03 10*3/uL (ref 0.00–0.07)
Basophils Absolute: 0.1 10*3/uL (ref 0.0–0.1)
Basophils Relative: 1 %
Eosinophils Absolute: 0.5 10*3/uL (ref 0.0–0.5)
Eosinophils Relative: 4 %
HCT: 36.6 % (ref 36.0–46.0)
Hemoglobin: 11.8 g/dL — ABNORMAL LOW (ref 12.0–15.0)
Immature Granulocytes: 0 %
Lymphocytes Relative: 17 %
Lymphs Abs: 2.1 10*3/uL (ref 0.7–4.0)
MCH: 30.3 pg (ref 26.0–34.0)
MCHC: 32.2 g/dL (ref 30.0–36.0)
MCV: 93.8 fL (ref 80.0–100.0)
Monocytes Absolute: 1.1 10*3/uL — ABNORMAL HIGH (ref 0.1–1.0)
Monocytes Relative: 9 %
Neutro Abs: 8.5 10*3/uL — ABNORMAL HIGH (ref 1.7–7.7)
Neutrophils Relative %: 69 %
Platelets: 376 10*3/uL (ref 150–400)
RBC: 3.9 MIL/uL (ref 3.87–5.11)
RDW: 14.4 % (ref 11.5–15.5)
WBC: 12.3 10*3/uL — ABNORMAL HIGH (ref 4.0–10.5)
nRBC: 0 % (ref 0.0–0.2)

## 2022-11-27 LAB — GLUCOSE, CAPILLARY
Glucose-Capillary: 149 mg/dL — ABNORMAL HIGH (ref 70–99)
Glucose-Capillary: 127 mg/dL — ABNORMAL HIGH (ref 70–99)
Glucose-Capillary: 99 mg/dL (ref 70–99)
Glucose-Capillary: 78 mg/dL (ref 70–99)

## 2022-11-27 LAB — RENAL FUNCTION PANEL
Albumin: 2.6 g/dL — ABNORMAL LOW (ref 3.5–5.0)
Anion gap: 8 (ref 5–15)
BUN: 6 mg/dL — ABNORMAL LOW (ref 8–23)
CO2: 29 mmol/L (ref 22–32)
Calcium: 8.4 mg/dL — ABNORMAL LOW (ref 8.9–10.3)
Chloride: 98 mmol/L (ref 98–111)
Creatinine, Ser: 0.39 mg/dL — ABNORMAL LOW (ref 0.44–1.00)
GFR, Estimated: >60 mL/min (ref >60)
Glucose, Bld: 191 mg/dL — ABNORMAL HIGH (ref 70–99)
Phosphorus: 2.8 mg/dL (ref 2.5–4.6)
Potassium: 3.8 mmol/L (ref 3.5–5.1)
Sodium: 135 mmol/L (ref 135–145)

## 2022-11-27 LAB — CULTURE, BLOOD (ROUTINE X 2): Culture: NO GROWTH

## 2022-11-27 MED ORDER — FUROSEMIDE 10 MG/ML IJ SOLN
40.0000 mg | Freq: Once | INTRAMUSCULAR | Status: AC
  Administered 2022-11-27: 40 mg via INTRAVENOUS
  Filled 2022-11-27: qty 4

## 2022-11-27 MED ORDER — EZETIMIBE 10 MG PO TABS
10.0000 mg | ORAL_TABLET | Freq: Every day | ORAL | Status: DC
  Administered 2022-11-27 – 2022-12-06 (×9): 10 mg via ORAL
  Filled 2022-11-27 (×9): qty 1

## 2022-11-27 NOTE — Progress Notes (Addendum)
  Progress Note    11/27/2022 7:54 AM 3 Days Post-Op  Subjective: no complaints    Vitals:   11/26/22 2123 11/27/22 0601  BP: (!) 154/55 (!) 163/54  Pulse: 93 94  Resp: (!) 21 19  Temp: 98.5 F (36.9 C) 98.6 F (37 C)  SpO2:  95%    Physical Exam: General: no acute distress Cardiac: regular Lungs:  nonlabored on 4L [place] Incisions:  left CF access soft without hematoma Extremities:  unchanged ulceration of right foot   CBC    Component Value Date/Time   WBC 12.3 (H) 11/27/2022 0219   RBC 3.90 11/27/2022 0219   HGB 11.8 (L) 11/27/2022 0219   HGB 17.0 (H) 09/21/2021 1543   HCT 36.6 11/27/2022 0219   HCT 49.4 (H) 09/21/2021 1543   PLT 376 11/27/2022 0219   PLT 464 (H) 09/21/2021 1543   MCV 93.8 11/27/2022 0219   MCV 88 09/21/2021 1543   MCH 30.3 11/27/2022 0219   MCHC 32.2 11/27/2022 0219   RDW 14.4 11/27/2022 0219   RDW 12.1 09/21/2021 1543   LYMPHSABS 2.1 11/27/2022 0219   LYMPHSABS 3.1 09/21/2021 1543   MONOABS 1.1 (H) 11/27/2022 0219   EOSABS 0.5 11/27/2022 0219   EOSABS 0.4 09/21/2021 1543   BASOSABS 0.1 11/27/2022 0219   BASOSABS 0.1 09/21/2021 1543    BMET    Component Value Date/Time   NA 135 11/27/2022 0219   NA 142 09/21/2021 1543   K 3.8 11/27/2022 0219   CL 98 11/27/2022 0219   CO2 29 11/27/2022 0219   GLUCOSE 191 (H) 11/27/2022 0219   BUN 6 (L) 11/27/2022 0219   BUN 8 09/21/2021 1543   CREATININE 0.39 (L) 11/27/2022 0219   CREATININE 0.56 11/04/2021 1019   CREATININE 0.45 (L) 12/03/2014 1542   CALCIUM 8.4 (L) 11/27/2022 0219   GFRNONAA >60 11/27/2022 0219   GFRNONAA >60 11/04/2021 1019   GFRNONAA >89 12/03/2014 1542   GFRAA 125 03/11/2020 1644   GFRAA >89 12/03/2014 1542    INR    Component Value Date/Time   INR 1.0 11/22/2022 1142     Intake/Output Summary (Last 24 hours) at 11/27/2022 0754 Last data filed at 11/27/2022 0300 Gross per 24 hour  Intake 2228.26 ml  Output 3000 ml  Net -771.74 ml      Assessment/Plan:  62  y.o. female 3 days post op, s/p: RLE angio for R foot wound   -L CF access site intact without hematoma -Right foot wound unchanged, will need extensive open revascularization -Echo yesterday demonstrated low normal EF 50-55%. Pending cardiology workup and pre-op clearance -Surgical plans per Dr.Brabham   Loel Dubonnet, PA-C [facility] [phone] 11/27/2022 7:54 AM   I have seen and evaluated the patient. I agree with the PA note as documented above.  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  Failed attempted endovascular intervention on Friday with Dr. Lenell Antu.  Will further discuss with Dr. Myra Gianotti who is back tomorrow operative plans moving forward.  Is a high risk surgical candidate with her COPD and poor baseline functional status.  Echo showed low normal EF of 50 to 55%.  Cephus Shelling, MD [facility] [place] Office: [phone]

## 2022-11-27 NOTE — TOC Benefit Eligibility Note (Signed)
Patient Product/process development scientist completed.    The patient is currently admitted and upon discharge could be taking Dexcom [employer].  Requires Prior Authorization  The patient is currently admitted and upon discharge could be taking [employer].  Requires Prior Authorization  The patient is insured through [employer] Medicare Part D   This test claim was processed through [facility]- copay amounts may vary at other pharmacies due to pharmacy/plan contracts, or as the patient moves through the different stages of their insurance plan.  Roland Earl, CPHT Pharmacy Patient Advocate Specialist [facility] Patient Advocate Team Direct Number: [phone]  Fax: [phone]

## 2022-11-27 NOTE — Progress Notes (Signed)
HD#5 Subjective:   Summary: Vanessa [name] is a 62 y.o. female with pertinent PMH of T2DM, COPD, HTN, and IBS who presents with progressively worsening ulcer on her right foot and is admitted for right foot osteomyelitis.  Has had some issues breathing yesterday and overnight.  Feels like there is more fluid on her.  Denies any productive cough but does have improvement when she takes her inhalers.  Objective:  Vital signs in last 24 hours: Vitals:   11/26/22 1800 11/26/22 1940 11/26/22 2123 11/27/22 0601  BP:   (!) 154/55 (!) 163/54  Pulse:   93 94  Resp:   (!) 21 19  Temp:   98.5 F (36.9 C) 98.6 F (37 C)  TempSrc:   Oral Oral  SpO2:  (!) 83%  95%  Weight:    69.1 kg  Height: 5\' 2"  (1.575 m)      Supplemental O2: Nasal Cannula SpO2: 95 % O2 Flow Rate (L/min): 5 L/min   Physical Exam:  Constitutional: Elderly female. In no acute distress. Cardio:Regular rate and rhythm. Pulm: Decreased breath sounds bilaterally with no significant wheezing. MSK/skin: Roughly 2 cm area of ulceration on the plantar aspect of the right foot beneath the fourth and fifth toes that extends laterally around the fifth toe base with thick skin surrounding and viable erythematous skin underlying on the edges of the wound.  Significant granulation tissue or purulent material underlying wound.  No midfoot tenderness.  There is mild warmth extending up the right foot and into the ankle but no significant warmth of the leg.  Round subcentimeter erythematous nodule on the medial first MTP on the right foot that is tender to palpation. Neuro:Alert and oriented x3. No focal deficit noted.  Filed Weights   11/25/22 0500 11/26/22 0500 11/27/22 0601  Weight: 67.8 kg 67.8 kg 69.1 kg      Intake/Output Summary (Last 24 hours) at 11/27/2022 0637 Last data filed at 11/27/2022 0300 Gross per 24 hour  Intake 2228.26 ml  Output 3000 ml  Net -771.74 ml   Net IO Since Admission: 1,811.01 mL [11/27/22  0637]  Pertinent Labs:    Latest Ref Rng & Units 11/27/2022    2:19 AM 11/25/2022    1:07 AM 11/24/2022    5:36 AM  CBC  WBC 4.0 - 10.5 K/uL 12.3  13.6  11.5   Hemoglobin 12.0 - 15.0 g/dL 09.8  11.9  14.7   Hematocrit 36.0 - 46.0 % 36.6  38.4  39.9   Platelets 150 - 400 K/uL 376  398  410        Latest Ref Rng & Units 11/27/2022    2:19 AM 11/25/2022    1:07 AM 11/24/2022    5:36 AM  CMP  Glucose 70 - 99 mg/dL 829  89  92   BUN 8 - 23 mg/dL 6  8  8    Creatinine 0.44 - 1.00 mg/dL 5.62  1.30  8.65   Sodium 135 - 145 mmol/L 135  132  132   Potassium 3.5 - 5.1 mmol/L 3.8  3.9  3.9   Chloride 98 - 111 mmol/L 98  96  99   CO2 22 - 32 mmol/L 29  25  25    Calcium 8.9 - 10.3 mg/dL 8.4  8.6  8.6     Assessment/Plan:   Principal Problem:   Osteomyelitis [facility])   Patient Summary: Vanessa [name] is a 62 y.o. female with pertinent PMH of T2DM,  COPD, HTN, and IBS who presents with progressively worsening ulcer on her right foot and is admitted for right foot osteomyelitis and currently pending vascular or surgical intervention, on hospital day 5.  Osteomyelitis of the right foot fourth phalanx Cellulitis of the right foot Nonhealing plantar ulcer on the right foot Peripheral arterial disease Uncontrolled diabetes with an A1c of 9.8 and history of claudication symptoms for over a year and lower extremities. MRI compatible with osteomyelitis in the proximal phalanx of the fourth toe.  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 VVS unable to study RLE due to iliac occlusion and obesity.  CTA abdomen/pelvis with runoff shows right common iliac artery occlusion with reconstitution of the proximal common femoral artery and diffuse SFA disease, otherwise nonocclusive stenosis in the infrarenal abdominal aorta, left common iliac artery, left SFA, and left popliteal artery.   She remains afebrile, stable leukocytosis. Bcx NGTD.  - VVS following, appreciate recs - Undergoing cardiac eval for open revascularization procedure --Podiatry plan to reevaluate after vascular intervention - Continue vancomycin and cefepime - Bcx no growth in 4 days - Pain control with Tylenol 1000 mg every 8 hours and Dilaudid 1 mg IV every 2 hours as needed   T2DM A1c 10/25/2022 was 10.6 and 9.8 here.  She is on Soliqua 30 units nightly at home which she was recently started on in our clinic.  She does want a Dexcom which we will address closer to discharge or on follow-up.  Fasting glucose this morning 149. - Semglee 15 units nightly and moderate SSI -- Continue CBG monitoring, CM diet   COPD On 5 L nasal cannula overnight with saturations 90+ which is up from 4 L yesterday.  Chest x-ray shows mild diffuse interstitial edema that an increase since 11/22/2022. --Continue supplemental O2 for O2 goal 88-92% and incentive spirometer - Will give dose of IV Lasix 40 mg --Continue home inhaler equivalents with Breo Ellipta, Incruse Ellipta, and albuterol.  Will optimize with Trelegy on discharge.   Hypertension Normotensive here and is on lisinopril 10 mg daily at home. - Continue lisinopril 10 mg daily   Anxiety/depression Tobacco use disorder Will continue home medications of bupropion and citalopram. - Bupropion 150 mg twice daily, citalopram 40 mg daily, and nicotine patch daily as needed   HLD Repeat lipid panel showed LDL of 129.   --Continue home atorvastatin 80 mg daily and add ezetimibe 10 mg daily  Diet: Carb-Modified VTE: Enoxaparin Code: Full  Dispo: Anticipated discharge home versus short-term rehab depending on further evaluation and management of osteomyelitis complicated by peripheral arterial disease.  Rocky Morel, DO 11/27/2022, 6:37 AM IM Resident, PGY-3 Pager: [phone] Internal Medicine Teaching Service [place] 41:10

## 2022-11-28 ENCOUNTER — Encounter (HOSPITAL_COMMUNITY): Payer: Self-pay | Admitting: Internal Medicine

## 2022-11-28 ENCOUNTER — Encounter: Payer: Self-pay | Admitting: *Deleted

## 2022-11-28 DIAGNOSIS — I50813 Acute on chronic right heart failure: Secondary | ICD-10-CM

## 2022-11-28 DIAGNOSIS — L03115 Cellulitis of right lower limb: Secondary | ICD-10-CM

## 2022-11-28 DIAGNOSIS — M86171 Other acute osteomyelitis, right ankle and foot: Secondary | ICD-10-CM | POA: Diagnosis not present

## 2022-11-28 DIAGNOSIS — E1151 Type 2 diabetes mellitus with diabetic peripheral angiopathy without gangrene: Secondary | ICD-10-CM | POA: Diagnosis not present

## 2022-11-28 DIAGNOSIS — E1169 Type 2 diabetes mellitus with other specified complication: Secondary | ICD-10-CM | POA: Diagnosis not present

## 2022-11-28 LAB — CBC WITH DIFFERENTIAL/PLATELET
Abs Immature Granulocytes: 0.04 10*3/uL (ref 0.00–0.07)
Basophils Absolute: 0.1 10*3/uL (ref 0.0–0.1)
Basophils Relative: 1 %
Eosinophils Absolute: 0.7 10*3/uL — ABNORMAL HIGH (ref 0.0–0.5)
Eosinophils Relative: 6 %
HCT: 37 % (ref 36.0–46.0)
Hemoglobin: 12 g/dL (ref 12.0–15.0)
Immature Granulocytes: 0 %
Lymphocytes Relative: 23 %
Lymphs Abs: 2.6 10*3/uL (ref 0.7–4.0)
MCH: 29.8 pg (ref 26.0–34.0)
MCHC: 32.4 g/dL (ref 30.0–36.0)
MCV: 91.8 fL (ref 80.0–100.0)
Monocytes Absolute: 1.1 10*3/uL — ABNORMAL HIGH (ref 0.1–1.0)
Monocytes Relative: 10 %
Neutro Abs: 6.9 10*3/uL (ref 1.7–7.7)
Neutrophils Relative %: 60 %
Platelets: 431 10*3/uL — ABNORMAL HIGH (ref 150–400)
RBC: 4.03 MIL/uL (ref 3.87–5.11)
RDW: 14.3 % (ref 11.5–15.5)
WBC: 11.4 10*3/uL — ABNORMAL HIGH (ref 4.0–10.5)
nRBC: 0 % (ref 0.0–0.2)

## 2022-11-28 LAB — GLUCOSE, CAPILLARY
Glucose-Capillary: 85 mg/dL (ref 70–99)
Glucose-Capillary: 59 mg/dL — ABNORMAL LOW (ref 70–99)
Glucose-Capillary: 81 mg/dL (ref 70–99)
Glucose-Capillary: 116 mg/dL — ABNORMAL HIGH (ref 70–99)
Glucose-Capillary: 146 mg/dL — ABNORMAL HIGH (ref 70–99)

## 2022-11-28 LAB — RENAL FUNCTION PANEL
Albumin: 2.6 g/dL — ABNORMAL LOW (ref 3.5–5.0)
Anion gap: 7 (ref 5–15)
BUN: 7 mg/dL — ABNORMAL LOW (ref 8–23)
CO2: 32 mmol/L (ref 22–32)
Calcium: 8.9 mg/dL (ref 8.9–10.3)
Chloride: 97 mmol/L — ABNORMAL LOW (ref 98–111)
Creatinine, Ser: 0.41 mg/dL — ABNORMAL LOW (ref 0.44–1.00)
GFR, Estimated: >60 mL/min (ref >60)
Glucose, Bld: 64 mg/dL — ABNORMAL LOW (ref 70–99)
Phosphorus: 3 mg/dL (ref 2.5–4.6)
Potassium: 3.5 mmol/L (ref 3.5–5.1)
Sodium: 136 mmol/L (ref 135–145)

## 2022-11-28 LAB — VANCOMYCIN, PEAK: Vancomycin Pk: 37 ug/mL (ref 30–40)

## 2022-11-28 MED ORDER — INSULIN GLARGINE-YFGN 100 UNIT/ML ~~LOC~~ SOLN
5.0000 [IU] | Freq: Every day | SUBCUTANEOUS | Status: DC
  Administered 2022-11-28 – 2022-11-29 (×2): 5 [IU] via SUBCUTANEOUS
  Filled 2022-11-28 (×4): qty 0.05

## 2022-11-28 MED ORDER — FUROSEMIDE 10 MG/ML IJ SOLN
40.0000 mg | Freq: Once | INTRAMUSCULAR | Status: AC
  Administered 2022-11-28: 40 mg via INTRAVENOUS
  Filled 2022-11-28: qty 4

## 2022-11-28 NOTE — H&P (View-Only) (Signed)
Progress Note    11/28/2022 9:25 AM 4 Days Post-Op  Subjective:  no complaints   Vitals:   11/28/22 0804 11/28/22 0805  BP:    Pulse:    Resp:    Temp:    SpO2: 94% 93%   Physical Exam: Lungs:  non labored Incisions:  L groin without hematoma Extremities: R plantar foot ulcer Neurologic: A&O  CBC    Component Value Date/Time   WBC 11.4 (H) 11/28/2022 0247   RBC 4.03 11/28/2022 0247   HGB 12.0 11/28/2022 0247   HGB 17.0 (H) 09/21/2021 1543   HCT 37.0 11/28/2022 0247   HCT 49.4 (H) 09/21/2021 1543   PLT 431 (H) 11/28/2022 0247   PLT 464 (H) 09/21/2021 1543   MCV 91.8 11/28/2022 0247   MCV 88 09/21/2021 1543   MCH 29.8 11/28/2022 0247   MCHC 32.4 11/28/2022 0247   RDW 14.3 11/28/2022 0247   RDW 12.1 09/21/2021 1543   LYMPHSABS 2.6 11/28/2022 0247   LYMPHSABS 3.1 09/21/2021 1543   MONOABS 1.1 (H) 11/28/2022 0247   EOSABS 0.7 (H) 11/28/2022 0247   EOSABS 0.4 09/21/2021 1543   BASOSABS 0.1 11/28/2022 0247   BASOSABS 0.1 09/21/2021 1543    BMET    Component Value Date/Time   NA 136 11/28/2022 0247   NA 142 09/21/2021 1543   K 3.5 11/28/2022 0247   CL 97 (L) 11/28/2022 0247   CO2 32 11/28/2022 0247   GLUCOSE 64 (L) 11/28/2022 0247   BUN 7 (L) 11/28/2022 0247   BUN 8 09/21/2021 1543   CREATININE 0.41 (L) 11/28/2022 0247   CREATININE 0.56 11/04/2021 1019   CREATININE 0.45 (L) 12/03/2014 1542   CALCIUM 8.9 11/28/2022 0247   GFRNONAA >60 11/28/2022 0247   GFRNONAA >60 11/04/2021 1019   GFRNONAA >89 12/03/2014 1542   GFRAA 125 03/11/2020 1644   GFRAA >89 12/03/2014 1542    INR    Component Value Date/Time   INR 1.0 11/22/2022 1142     Intake/Output Summary (Last 24 hours) at 11/28/2022 0925 Last data filed at 11/27/2022 2300 Gross per 24 hour  Intake 740 ml  Output 4700 ml  Net -3960 ml     Assessment/Plan:  61 y.o. female is s/p RLE angiogram 4 Days Post-Op   Left groin cath site without hematoma Right foot wound unchanged Tentative plans  for right axillary to femoral bypass graft by Dr. Shell Blanchette tomorrow.  She will be kept n.p.o. past midnight.  Dr. Moncerrat Burnstein will discuss surgical plans with the patient later today.   Matthew Eveland, PA-C [facility] [phone] 11/28/2022 9:25 AM   I had an extensive conversation with the patient and her husband.  We discussed her significant disease within her aorta as well as bilateral iliacs.  She has total occlusion of the right iliac system with reconstitution of the common femoral.  There is mild femoral-popliteal disease by CT scan with three-vessel runoff on the right.  I think her best option would be an aortobifemoral bypass graft, however with her COPD and oxygen requirement as well as extremely sedentary lifestyle and ongoing tobacco abuse, I am worried about her ability to tolerate an open abdominal operation.  In addition prior GYN surgery was associated with bowel and bladder injury as well as hernia all of which have been repaired.  I think she would have a difficult time with an intra-abdominal operation.  Therefore discussed proceeding with a right axillary to common femoral artery bypass graft to get   her inline flow.  I discussed the limited patency with this operation however our goal is only to heal her wound.  She does not have symptoms of claudication.  I discussed the risk for groin wound issues.  All questions were answered.  She will be scheduled for surgery tomorrow.  I will also reach out to Dr. Wagoner for further direction regarding her foot   Wells Matt Delpizzo 

## 2022-11-28 NOTE — Progress Notes (Addendum)
Progress Note    11/28/2022 9:25 AM 4 Days Post-Op  Subjective:  no complaints   Vitals:   11/28/22 0804 11/28/22 0805  BP:    Pulse:    Resp:    Temp:    SpO2: 94% 93%   Physical Exam: Lungs:  non labored Incisions:  L groin without hematoma Extremities: R plantar foot ulcer Neurologic: A&O  CBC    Component Value Date/Time   WBC 11.4 (H) 11/28/2022 0247   RBC 4.03 11/28/2022 0247   HGB 12.0 11/28/2022 0247   HGB 17.0 (H) 09/21/2021 1543   HCT 37.0 11/28/2022 0247   HCT 49.4 (H) 09/21/2021 1543   PLT 431 (H) 11/28/2022 0247   PLT 464 (H) 09/21/2021 1543   MCV 91.8 11/28/2022 0247   MCV 88 09/21/2021 1543   MCH 29.8 11/28/2022 0247   MCHC 32.4 11/28/2022 0247   RDW 14.3 11/28/2022 0247   RDW 12.1 09/21/2021 1543   LYMPHSABS 2.6 11/28/2022 0247   LYMPHSABS 3.1 09/21/2021 1543   MONOABS 1.1 (H) 11/28/2022 0247   EOSABS 0.7 (H) 11/28/2022 0247   EOSABS 0.4 09/21/2021 1543   BASOSABS 0.1 11/28/2022 0247   BASOSABS 0.1 09/21/2021 1543    BMET    Component Value Date/Time   NA 136 11/28/2022 0247   NA 142 09/21/2021 1543   K 3.5 11/28/2022 0247   CL 97 (L) 11/28/2022 0247   CO2 32 11/28/2022 0247   GLUCOSE 64 (L) 11/28/2022 0247   BUN 7 (L) 11/28/2022 0247   BUN 8 09/21/2021 1543   CREATININE 0.41 (L) 11/28/2022 0247   CREATININE 0.56 11/04/2021 1019   CREATININE 0.45 (L) 12/03/2014 1542   CALCIUM 8.9 11/28/2022 0247   GFRNONAA >60 11/28/2022 0247   GFRNONAA >60 11/04/2021 1019   GFRNONAA >89 12/03/2014 1542   GFRAA 125 03/11/2020 1644   GFRAA >89 12/03/2014 1542    INR    Component Value Date/Time   INR 1.0 11/22/2022 1142     Intake/Output Summary (Last 24 hours) at 11/28/2022 0925 Last data filed at 11/27/2022 2300 Gross per 24 hour  Intake 740 ml  Output 4700 ml  Net -3960 ml     Assessment/Plan:  62 y.o. female is s/p RLE angiogram 4 Days Post-Op   Left groin cath site without hematoma Right foot wound unchanged Tentative plans  for right axillary to femoral bypass graft by Dr. Myra Gianotti tomorrow.  She will be kept n.p.o. past midnight.  Dr. Myra Gianotti will discuss surgical plans with the patient later today.   Vanessa Rutter, PA-C [facility] [phone] 11/28/2022 9:25 AM   I had an extensive conversation with the patient and her husband.  We discussed her significant disease within her aorta as well as bilateral iliacs.  She has total occlusion of the right iliac system with reconstitution of the common femoral.  There is mild femoral-popliteal disease by CT scan with three-vessel runoff on the right.  I think her best option would be an aortobifemoral bypass graft, however with her COPD and oxygen requirement as well as extremely sedentary lifestyle and ongoing tobacco abuse, I am worried about her ability to tolerate an open abdominal operation.  In addition prior GYN surgery was associated with bowel and bladder injury as well as hernia all of which have been repaired.  I think she would have a difficult time with an intra-abdominal operation.  Therefore discussed proceeding with a right axillary to common femoral artery bypass graft to get  her inline flow.  I discussed the limited patency with this operation however our goal is only to heal her wound.  She does not have symptoms of claudication.  I discussed the risk for groin wound issues.  All questions were answered.  She will be scheduled for surgery tomorrow.  I will also reach out to Dr. Ardelle Anton for further direction regarding her foot   Vanessa [name]

## 2022-11-28 NOTE — Progress Notes (Signed)
HD#6 Subjective:   Summary: Vanessa [name] is a 62 y.o. female with pertinent PMH of T2DM, COPD, HTN, and IBS who presents with progressively worsening ulcer on her right foot and is admitted for right foot osteomyelitis.  Patient is doing okay this morning without any worsening respiratory issues.  No cough and no fevers.  She is breathing better compared to yesterday but does still think that there is some fluid on her.  She is also worried about the revascularization surgery and still wants to avoid amputation if at all possible.  Objective:  Vital signs in last 24 hours: Vitals:   11/27/22 1954 11/27/22 2306 11/28/22 0500 11/28/22 0614  BP: (!) 170/67 (!) 116/59  127/65  Pulse: 82 78    Resp: 17 17  13   Temp: 98 F (36.7 C) 98.2 F (36.8 C)  98.2 F (36.8 C)  TempSrc: Oral Oral  Oral  SpO2: 91% 91%  92%  Weight:   69.1 kg   Height:       Supplemental O2: Nasal Cannula SpO2: 92 % O2 Flow Rate (L/min): 2 L/min   Physical Exam:  Constitutional: Elderly female. In no acute distress. Cardio:Regular rate and rhythm. Pulm: Mildly decreased breath sounds in the bases bilaterally with no significant wheezing. MSK/skin: Roughly 2 cm area of ulceration on the plantar aspect of the right foot beneath the fourth and fifth toes that extends laterally around the fifth toe base with signs of gangrene in the fifth toe.  Erythema and warmth extending up the right foot and into the ankle but no significant warmth of the leg.  Neuro:Alert and oriented x3. No focal deficit noted.  Filed Weights   11/26/22 0500 11/27/22 0601 11/28/22 0500  Weight: 67.8 kg 69.1 kg 69.1 kg      Intake/Output Summary (Last 24 hours) at 11/28/2022 0630 Last data filed at 11/27/2022 2300 Gross per 24 hour  Intake 980 ml  Output 4700 ml  Net -3720 ml   Net IO Since Admission: -1,908.99 mL [11/28/22 0630]  Pertinent Labs:    Latest Ref Rng & Units 11/28/2022    2:47 AM 11/27/2022    2:19 AM 11/25/2022    1:07  AM  CBC  WBC 4.0 - 10.5 K/uL 11.4  12.3  13.6   Hemoglobin 12.0 - 15.0 g/dL 54.0  98.1  19.1   Hematocrit 36.0 - 46.0 % 37.0  36.6  38.4   Platelets 150 - 400 K/uL 431  376  398        Latest Ref Rng & Units 11/28/2022    2:47 AM 11/27/2022    2:19 AM 11/25/2022    1:07 AM  CMP  Glucose 70 - 99 mg/dL 64  478  89   BUN 8 - 23 mg/dL 7  6  8    Creatinine 0.44 - 1.00 mg/dL 2.95  6.21  3.08   Sodium 135 - 145 mmol/L 136  135  132   Potassium 3.5 - 5.1 mmol/L 3.5  3.8  3.9   Chloride 98 - 111 mmol/L 97  98  96   CO2 22 - 32 mmol/L 32  29  25   Calcium 8.9 - 10.3 mg/dL 8.9  8.4  8.6     Assessment/Plan:   Principal Problem:   Osteomyelitis [facility])   Patient Summary: Vanessa [name] is a 62 y.o. female with pertinent PMH of T2DM, COPD, HTN, and IBS who presents with progressively worsening ulcer on her right  foot and is admitted for right foot osteomyelitis and currently pending vascular or surgical intervention, on hospital day 6.  Osteomyelitis of the right foot fourth phalanx Cellulitis of the right foot Nonhealing plantar ulcer on the right foot Peripheral arterial disease Uncontrolled diabetes with an A1c of 9.8 and history of claudication symptoms for over a year and lower extremities. MRI compatible with osteomyelitis in the proximal phalanx of the fourth toe.  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 VVS unable to study RLE due to iliac occlusion and obesity.  CTA abdomen/pelvis with runoff shows right common iliac artery occlusion with reconstitution of the proximal common femoral artery and diffuse SFA disease, otherwise nonocclusive stenosis in the infrarenal abdominal aorta, left common iliac artery, left SFA, and left popliteal artery.  She remains afebrile, stable leukocytosis. Bcx NGTD.  - VVS following, appreciate recs - Undergoing cardiac eval  for open revascularization procedure --Podiatry plan to reevaluate after vascular intervention - Continue vancomycin and cefepime - Bcx no growth in 5 days - Pain control with Tylenol 1000 mg every 8 hours and Dilaudid 1 mg IV every 2 hours as needed  HFmrEF On 4-5 L nasal cannula for the past couple days with saturations 90+. Chest x-ray shows mild diffuse interstitial edema that an increase since 11/22/2022.  TTE for cardiac clearance showed LVEF of 50 to 55%.  Increased oxygen demands was likely caused by too much IV fluids.  Responded well to IV Lasix with 4.7 L urine output yesterday and down to 2 L nasal cannula. - Repeat dose of Lasix 40 mg IV and wean down to room air if possible   T2DM A1c 10/25/2022 was 10.6 and 9.8 here.  She is on Soliqua 30 units nightly at home which she was recently started on in our clinic.  She does want a Dexcom which we will address closer to discharge or on follow-up.  Fasting glucose in the 80s this morning.  Will decrease long-acting insulin to avoid any lows. - Semglee 5 units nightly and moderate SSI -- Continue CBG monitoring, CM diet   COPD On 2 L nasal cannula due to pulmonary edema as above. Not consistent with COPD exacerbation and treating for heart failure --Continue supplemental O2 for O2 goal 88-92% and incentive spirometer --Continue home inhaler equivalents with Breo Ellipta, Incruse Ellipta, and albuterol.  Will optimize with Trelegy on discharge.   Hypertension Normotensive here and is on lisinopril 10 mg daily at home. - Continue lisinopril 10 mg daily   Anxiety/depression Tobacco use disorder Will continue home medications of bupropion and citalopram. - Bupropion 150 mg twice daily, citalopram 40 mg daily, and nicotine patch daily as needed   HLD Repeat lipid panel showed LDL of 129.   --Continue home atorvastatin 80 mg daily and add ezetimibe 10 mg daily  Diet: Carb-Modified VTE: Enoxaparin Code: Full  Dispo: Anticipated  discharge home versus short-term rehab depending on further evaluation and management of osteomyelitis complicated by peripheral arterial disease.  Rocky Morel, DO 11/28/2022, 6:30 AM IM Resident, PGY-3 Pager: [phone] Internal Medicine Teaching Service [place] 41:10

## 2022-11-29 ENCOUNTER — Inpatient Hospital Stay (HOSPITAL_COMMUNITY): Payer: 59

## 2022-11-29 ENCOUNTER — Other Ambulatory Visit: Payer: Self-pay

## 2022-11-29 ENCOUNTER — Encounter (HOSPITAL_COMMUNITY): Payer: Self-pay | Admitting: Internal Medicine

## 2022-11-29 ENCOUNTER — Encounter (HOSPITAL_COMMUNITY)
Admission: EM | Disposition: A | Discharge status: Home / Self Care | Payer: Self-pay | Source: Home / Self Care | Attending: Internal Medicine

## 2022-11-29 DIAGNOSIS — M869 Osteomyelitis, unspecified: Secondary | ICD-10-CM

## 2022-11-29 DIAGNOSIS — L03115 Cellulitis of right lower limb: Secondary | ICD-10-CM | POA: Diagnosis not present

## 2022-11-29 DIAGNOSIS — I10 Essential (primary) hypertension: Secondary | ICD-10-CM

## 2022-11-29 DIAGNOSIS — E1151 Type 2 diabetes mellitus with diabetic peripheral angiopathy without gangrene: Secondary | ICD-10-CM | POA: Diagnosis not present

## 2022-11-29 DIAGNOSIS — F17210 Nicotine dependence, cigarettes, uncomplicated: Secondary | ICD-10-CM | POA: Diagnosis not present

## 2022-11-29 DIAGNOSIS — I70221 Atherosclerosis of native arteries of extremities with rest pain, right leg: Secondary | ICD-10-CM

## 2022-11-29 DIAGNOSIS — I96 Gangrene, not elsewhere classified: Secondary | ICD-10-CM

## 2022-11-29 DIAGNOSIS — M86171 Other acute osteomyelitis, right ankle and foot: Secondary | ICD-10-CM | POA: Diagnosis not present

## 2022-11-29 DIAGNOSIS — I70235 Atherosclerosis of native arteries of right leg with ulceration of other part of foot: Secondary | ICD-10-CM | POA: Diagnosis not present

## 2022-11-29 DIAGNOSIS — J189 Pneumonia, unspecified organism: Secondary | ICD-10-CM | POA: Diagnosis not present

## 2022-11-29 DIAGNOSIS — E1169 Type 2 diabetes mellitus with other specified complication: Secondary | ICD-10-CM | POA: Diagnosis not present

## 2022-11-29 HISTORY — PX: APPLICATION OF WOUND VAC: SHX5189

## 2022-11-29 HISTORY — PX: AXILLARY-FEMORAL BYPASS GRAFT: SHX894

## 2022-11-29 LAB — GLUCOSE, CAPILLARY
Glucose-Capillary: 76 mg/dL (ref 70–99)
Glucose-Capillary: 109 mg/dL — ABNORMAL HIGH (ref 70–99)
Glucose-Capillary: 60 mg/dL — ABNORMAL LOW (ref 70–99)
Glucose-Capillary: 136 mg/dL — ABNORMAL HIGH (ref 70–99)
Glucose-Capillary: 124 mg/dL — ABNORMAL HIGH (ref 70–99)
Glucose-Capillary: 221 mg/dL — ABNORMAL HIGH (ref 70–99)

## 2022-11-29 LAB — POCT I-STAT 7, (LYTES, BLD GAS, ICA,H+H)
Acid-Base Excess: 1 mmol/L (ref 0.0–2.0)
Bicarbonate: 30.8 mmol/L — ABNORMAL HIGH (ref 20.0–28.0)
Calcium, Ion: 1.24 mmol/L (ref 1.15–1.40)
HCT: 37 % (ref 36.0–46.0)
Hemoglobin: 12.6 g/dL (ref 12.0–15.0)
O2 Saturation: 99 %
Potassium: 4.3 mmol/L (ref 3.5–5.1)
Sodium: 138 mmol/L (ref 135–145)
TCO2: 33 mmol/L — ABNORMAL HIGH (ref 22–32)
pCO2 arterial: 76.7 mmHg (ref 32–48)
pH, Arterial: 7.212 — ABNORMAL LOW (ref 7.35–7.45)
pO2, Arterial: 157 mmHg — ABNORMAL HIGH (ref 83–108)

## 2022-11-29 LAB — RENAL FUNCTION PANEL
Albumin: 2.6 g/dL — ABNORMAL LOW (ref 3.5–5.0)
Anion gap: 9 (ref 5–15)
BUN: 7 mg/dL — ABNORMAL LOW (ref 8–23)
CO2: 30 mmol/L (ref 22–32)
Calcium: 8.7 mg/dL — ABNORMAL LOW (ref 8.9–10.3)
Chloride: 97 mmol/L — ABNORMAL LOW (ref 98–111)
Creatinine, Ser: 0.39 mg/dL — ABNORMAL LOW (ref 0.44–1.00)
GFR, Estimated: >60 mL/min (ref >60)
Glucose, Bld: 76 mg/dL (ref 70–99)
Phosphorus: 3.3 mg/dL (ref 2.5–4.6)
Potassium: 3.3 mmol/L — ABNORMAL LOW (ref 3.5–5.1)
Sodium: 136 mmol/L (ref 135–145)

## 2022-11-29 LAB — POCT ACTIVATED CLOTTING TIME
Activated Clotting Time: 244 seconds
Activated Clotting Time: 201 seconds

## 2022-11-29 LAB — SURGICAL PCR SCREEN
MRSA, PCR: NEGATIVE
Staphylococcus aureus: NEGATIVE

## 2022-11-29 LAB — CBC WITH DIFFERENTIAL/PLATELET
Abs Immature Granulocytes: 0.03 10*3/uL (ref 0.00–0.07)
Basophils Absolute: 0.1 10*3/uL (ref 0.0–0.1)
Basophils Relative: 1 %
Eosinophils Absolute: 0.6 10*3/uL — ABNORMAL HIGH (ref 0.0–0.5)
Eosinophils Relative: 6 %
HCT: 35.8 % — ABNORMAL LOW (ref 36.0–46.0)
Hemoglobin: 11.6 g/dL — ABNORMAL LOW (ref 12.0–15.0)
Immature Granulocytes: 0 %
Lymphocytes Relative: 18 %
Lymphs Abs: 1.9 10*3/uL (ref 0.7–4.0)
MCH: 29 pg (ref 26.0–34.0)
MCHC: 32.4 g/dL (ref 30.0–36.0)
MCV: 89.5 fL (ref 80.0–100.0)
Monocytes Absolute: 0.9 10*3/uL (ref 0.1–1.0)
Monocytes Relative: 9 %
Neutro Abs: 7.3 10*3/uL (ref 1.7–7.7)
Neutrophils Relative %: 66 %
Platelets: 428 10*3/uL — ABNORMAL HIGH (ref 150–400)
RBC: 4 MIL/uL (ref 3.87–5.11)
RDW: 14 % (ref 11.5–15.5)
WBC: 10.9 10*3/uL — ABNORMAL HIGH (ref 4.0–10.5)
nRBC: 0 % (ref 0.0–0.2)

## 2022-11-29 LAB — TYPE AND SCREEN
ABO/RH(D): O POS
Antibody Screen: NEGATIVE

## 2022-11-29 LAB — MAGNESIUM: Magnesium: 1.8 mg/dL (ref 1.7–2.4)

## 2022-11-29 LAB — VANCOMYCIN, TROUGH: Vancomycin Tr: 10 ug/mL — ABNORMAL LOW (ref 15–20)

## 2022-11-29 SURGERY — CREATION, BYPASS, ARTERIAL, AXILLARY TO BILATERAL FEMORAL, USING GRAFT
Anesthesia: General | Site: Groin | Laterality: Right

## 2022-11-29 MED ORDER — FENTANYL CITRATE (PF) 250 MCG/5ML IJ SOLN
INTRAMUSCULAR | Status: DC | PRN
  Administered 2022-11-29 (×2): 25 ug via INTRAVENOUS
  Administered 2022-11-29: 50 ug via INTRAVENOUS
  Administered 2022-11-29: 25 ug via INTRAVENOUS

## 2022-11-29 MED ORDER — SODIUM CHLORIDE 0.9 % IV SOLN: INTRAVENOUS | Status: DC

## 2022-11-29 MED ORDER — SURGIFLO WITH THROMBIN (HEMOSTATIC MATRIX KIT) OPTIME
TOPICAL | Status: DC | PRN
  Administered 2022-11-29: 1 via TOPICAL

## 2022-11-29 MED ORDER — ORAL CARE MOUTH RINSE: 15.0000 mL | Freq: Once | OROMUCOSAL | Status: AC

## 2022-11-29 MED ORDER — MAGNESIUM SULFATE 2 GM/50ML IV SOLN: 2.0000 g | Freq: Every day | INTRAVENOUS | Status: DC | PRN

## 2022-11-29 MED ORDER — MIDAZOLAM HCL 2 MG/2ML IJ SOLN
INTRAMUSCULAR | Status: AC
  Filled 2022-11-29: qty 2

## 2022-11-29 MED ORDER — DEXTROSE 50 % IV SOLN
INTRAVENOUS | Status: AC
  Filled 2022-11-29: qty 50

## 2022-11-29 MED ORDER — SUGAMMADEX SODIUM 200 MG/2ML IV SOLN
INTRAVENOUS | Status: DC | PRN
  Administered 2022-11-29: 200 mg via INTRAVENOUS

## 2022-11-29 MED ORDER — PROPOFOL 10 MG/ML IV BOLUS
INTRAVENOUS | Status: DC | PRN
  Administered 2022-11-29: 80 mg via INTRAVENOUS

## 2022-11-29 MED ORDER — CHLORHEXIDINE GLUCONATE 0.12 % MT SOLN
OROMUCOSAL | Status: AC
  Administered 2022-11-29: 15 mL via OROMUCOSAL
  Filled 2022-11-29: qty 15

## 2022-11-29 MED ORDER — ROCURONIUM BROMIDE 10 MG/ML (PF) SYRINGE
PREFILLED_SYRINGE | INTRAVENOUS | Status: DC | PRN
  Administered 2022-11-29: 20 mg via INTRAVENOUS
  Administered 2022-11-29: 60 mg via INTRAVENOUS

## 2022-11-29 MED ORDER — CHLORHEXIDINE GLUCONATE 0.12 % MT SOLN: 15.0000 mL | Freq: Once | OROMUCOSAL | Status: AC

## 2022-11-29 MED ORDER — LACTATED RINGERS IV SOLN: INTRAVENOUS | Status: DC | PRN

## 2022-11-29 MED ORDER — ROCURONIUM BROMIDE 10 MG/ML (PF) SYRINGE
PREFILLED_SYRINGE | INTRAVENOUS | Status: AC
  Filled 2022-11-29: qty 10

## 2022-11-29 MED ORDER — LACTATED RINGERS IV SOLN: INTRAVENOUS | Status: DC

## 2022-11-29 MED ORDER — INSULIN ASPART 100 UNIT/ML IJ SOLN: 0.0000 [IU] | INTRAMUSCULAR | Status: DC | PRN

## 2022-11-29 MED ORDER — HEPARIN 6000 UNIT IRRIGATION SOLUTION
Status: DC | PRN
  Administered 2022-11-29: 1

## 2022-11-29 MED ORDER — DEXTROSE 50 % IV SOLN
INTRAVENOUS | Status: DC | PRN
  Administered 2022-11-29: 12.5 g via INTRAVENOUS

## 2022-11-29 MED ORDER — LIDOCAINE 2% (20 MG/ML) 5 ML SYRINGE
INTRAMUSCULAR | Status: AC
  Filled 2022-11-29: qty 5

## 2022-11-29 MED ORDER — HEMOSTATIC AGENTS (NO CHARGE) OPTIME
TOPICAL | Status: DC | PRN
  Administered 2022-11-29: 1 via TOPICAL

## 2022-11-29 MED ORDER — ACETAMINOPHEN 500 MG PO TABS
1000.0000 mg | ORAL_TABLET | Freq: Once | ORAL | Status: AC
  Administered 2022-11-29: 1000 mg via ORAL
  Filled 2022-11-29: qty 2

## 2022-11-29 MED ORDER — BISACODYL 5 MG PO TBEC: 5.0000 mg | DELAYED_RELEASE_TABLET | Freq: Every day | ORAL | Status: DC | PRN

## 2022-11-29 MED ORDER — DOCUSATE SODIUM 100 MG PO CAPS
100.0000 mg | ORAL_CAPSULE | Freq: Every day | ORAL | Status: DC
  Administered 2022-11-30: 100 mg via ORAL
  Filled 2022-11-29: qty 1

## 2022-11-29 MED ORDER — DEXAMETHASONE SODIUM PHOSPHATE 10 MG/ML IJ SOLN
INTRAMUSCULAR | Status: DC | PRN
  Administered 2022-11-29: 5 mg via INTRAVENOUS

## 2022-11-29 MED ORDER — HEPARIN 6000 UNIT IRRIGATION SOLUTION
Status: AC
  Filled 2022-11-29: qty 500

## 2022-11-29 MED ORDER — ESMOLOL HCL 100 MG/10ML IV SOLN
INTRAVENOUS | Status: DC | PRN
  Administered 2022-11-29: 20 mg via INTRAVENOUS

## 2022-11-29 MED ORDER — ALUM & MAG HYDROXIDE-SIMETH 200-200-20 MG/5ML PO SUSP: 15.0000 mL | ORAL | Status: DC | PRN

## 2022-11-29 MED ORDER — PHENOL 1.4 % MT LIQD: 1.0000 | OROMUCOSAL | Status: DC | PRN

## 2022-11-29 MED ORDER — POTASSIUM CHLORIDE 10 MEQ/100ML IV SOLN
10.0000 meq | INTRAVENOUS | Status: AC
  Administered 2022-11-29 (×3): 10 meq via INTRAVENOUS
  Filled 2022-11-29 (×4): qty 100

## 2022-11-29 MED ORDER — HYDRALAZINE HCL 20 MG/ML IJ SOLN: 5.0000 mg | INTRAMUSCULAR | Status: DC | PRN

## 2022-11-29 MED ORDER — SENNOSIDES-DOCUSATE SODIUM 8.6-50 MG PO TABS: 1.0000 | ORAL_TABLET | Freq: Every evening | ORAL | Status: DC | PRN

## 2022-11-29 MED ORDER — PROTAMINE SULFATE 10 MG/ML IV SOLN
INTRAVENOUS | Status: DC | PRN
  Administered 2022-11-29 (×4): 10 mg via INTRAVENOUS

## 2022-11-29 MED ORDER — FENTANYL CITRATE (PF) 250 MCG/5ML IJ SOLN
INTRAMUSCULAR | Status: AC
  Filled 2022-11-29: qty 5

## 2022-11-29 MED ORDER — ONDANSETRON HCL 4 MG/2ML IJ SOLN
INTRAMUSCULAR | Status: AC
  Filled 2022-11-29: qty 2

## 2022-11-29 MED ORDER — DEXAMETHASONE SODIUM PHOSPHATE 10 MG/ML IJ SOLN
INTRAMUSCULAR | Status: AC
  Filled 2022-11-29: qty 1

## 2022-11-29 MED ORDER — PROMETHAZINE HCL 25 MG/ML IJ SOLN: 6.2500 mg | INTRAMUSCULAR | Status: DC | PRN

## 2022-11-29 MED ORDER — PHENYLEPHRINE HCL-NACL 20-0.9 MG/250ML-% IV SOLN
INTRAVENOUS | Status: DC | PRN
  Administered 2022-11-29: 25 ug/min via INTRAVENOUS

## 2022-11-29 MED ORDER — MIDAZOLAM HCL 2 MG/2ML IJ SOLN
INTRAMUSCULAR | Status: DC | PRN
  Administered 2022-11-29: 1 mg via INTRAVENOUS

## 2022-11-29 MED ORDER — GUAIFENESIN-DM 100-10 MG/5ML PO SYRP: 15.0000 mL | ORAL_SOLUTION | ORAL | Status: DC | PRN

## 2022-11-29 MED ORDER — SODIUM CHLORIDE 0.9 % IV SOLN: 500.0000 mL | Freq: Once | INTRAVENOUS | Status: DC | PRN

## 2022-11-29 MED ORDER — ALBUTEROL SULFATE HFA 108 (90 BASE) MCG/ACT IN AERS
INHALATION_SPRAY | RESPIRATORY_TRACT | Status: DC | PRN
  Administered 2022-11-29: 2 via RESPIRATORY_TRACT

## 2022-11-29 MED ORDER — AMISULPRIDE (ANTIEMETIC) 5 MG/2ML IV SOLN: 10.0000 mg | Freq: Once | INTRAVENOUS | Status: DC | PRN

## 2022-11-29 MED ORDER — LIDOCAINE 2% (20 MG/ML) 5 ML SYRINGE
INTRAMUSCULAR | Status: DC | PRN
  Administered 2022-11-29: 60 mg via INTRAVENOUS

## 2022-11-29 MED ORDER — DEXTROSE 50 % IV SOLN
12.5000 g | INTRAVENOUS | Status: AC
  Administered 2022-11-29: 12.5 g via INTRAVENOUS

## 2022-11-29 MED ORDER — ALBUTEROL SULFATE HFA 108 (90 BASE) MCG/ACT IN AERS
INHALATION_SPRAY | RESPIRATORY_TRACT | Status: AC
  Filled 2022-11-29: qty 6.7

## 2022-11-29 MED ORDER — POTASSIUM CHLORIDE CRYS ER 20 MEQ PO TBCR: 20.0000 meq | EXTENDED_RELEASE_TABLET | Freq: Every day | ORAL | Status: DC | PRN

## 2022-11-29 MED ORDER — 0.9 % SODIUM CHLORIDE (POUR BTL) OPTIME
TOPICAL | Status: DC | PRN
  Administered 2022-11-29: 2000 mL

## 2022-11-29 MED ORDER — ONDANSETRON HCL 4 MG/2ML IJ SOLN
INTRAMUSCULAR | Status: DC | PRN
  Administered 2022-11-29: 4 mg via INTRAVENOUS

## 2022-11-29 MED ORDER — PHENYLEPHRINE 80 MCG/ML (10ML) SYRINGE FOR IV PUSH (FOR BLOOD PRESSURE SUPPORT)
PREFILLED_SYRINGE | INTRAVENOUS | Status: DC | PRN
  Administered 2022-11-29: 80 ug via INTRAVENOUS

## 2022-11-29 MED ORDER — HYDROMORPHONE HCL 1 MG/ML IJ SOLN: 0.2500 mg | INTRAMUSCULAR | Status: DC | PRN

## 2022-11-29 MED ORDER — ASPIRIN 81 MG PO TBEC
81.0000 mg | DELAYED_RELEASE_TABLET | Freq: Every day | ORAL | Status: DC
  Administered 2022-11-30 – 2022-12-06 (×6): 81 mg via ORAL
  Filled 2022-11-29 (×7): qty 1

## 2022-11-29 MED ORDER — MEPERIDINE HCL 25 MG/ML IJ SOLN: 6.2500 mg | INTRAMUSCULAR | Status: DC | PRN

## 2022-11-29 SURGICAL SUPPLY — 57 items
ADH SKN CLS APL DERMABOND .7 (GAUZE/BANDAGES/DRESSINGS) ×4
AGENT HMST KT MTR STRL THRMB (HEMOSTASIS) ×2
BAG COUNTER SPONGE SURGICOUNT (BAG) ×2 IMPLANT
BAG SPNG CNTER NS LX DISP (BAG) ×2
CANISTER SUCT 3000ML PPV (MISCELLANEOUS) ×2 IMPLANT
CANNULA VESSEL 3MM 2 BLNT TIP (CANNULA) IMPLANT
CLIP TI MEDIUM 24 (CLIP) ×2 IMPLANT
CLIP TI WIDE RED SMALL 24 (CLIP) ×2 IMPLANT
DERMABOND ADVANCED .7 DNX12 (GAUZE/BANDAGES/DRESSINGS) ×2 IMPLANT
DRAIN SNY 10X20 3/4 PERF (WOUND CARE) IMPLANT
DRAPE INCISE IOBAN 66X45 STRL (DRAPES) ×2 IMPLANT
DRAPE X-RAY CASS 24X20 (DRAPES) IMPLANT
DRESSING PEEL AND PLC PRVNA 13 (GAUZE/BANDAGES/DRESSINGS) IMPLANT
DRSG PEEL AND PLACE PREVENA 13 (GAUZE/BANDAGES/DRESSINGS) ×2
ELECT REM PT RETURN 9FT ADLT (ELECTROSURGICAL) ×2
ELECTRODE REM PT RTRN 9FT ADLT (ELECTROSURGICAL) ×2 IMPLANT
EVACUATOR SILICONE 100CC (DRAIN) IMPLANT
GAUZE 4X4 16PLY ~~LOC~~+RFID DBL (SPONGE) IMPLANT
GLOVE SURG SS PI 7.5 STRL IVOR (GLOVE) ×6 IMPLANT
GOWN STRL REUS W/ TWL LRG LVL3 (GOWN DISPOSABLE) ×4 IMPLANT
GOWN STRL REUS W/ TWL XL LVL3 (GOWN DISPOSABLE) ×2 IMPLANT
GOWN STRL REUS W/TWL LRG LVL3 (GOWN DISPOSABLE) ×4
GOWN STRL REUS W/TWL XL LVL3 (GOWN DISPOSABLE) ×2
GRAFT PROPATEN W/RING 8X80X70 (Vascular Products) IMPLANT
GRAFT SKIN WND MICRO 38 (Tissue) IMPLANT
HEMOSTAT SNOW SURGICEL 2X4 (HEMOSTASIS) IMPLANT
INSERT FOGARTY SM (MISCELLANEOUS) IMPLANT
KIT BASIN OR (CUSTOM PROCEDURE TRAY) ×2 IMPLANT
KIT DRSG PREVENA PLUS 7DAY 125 (MISCELLANEOUS) IMPLANT
KIT TURNOVER KIT B (KITS) ×2 IMPLANT
LOOP VASCULAR MINI 18 RED (MISCELLANEOUS) ×2
NS IRRIG 1000ML POUR BTL (IV SOLUTION) ×4 IMPLANT
PACK PERIPHERAL VASCULAR (CUSTOM PROCEDURE TRAY) ×2 IMPLANT
PAD ARMBOARD 7.5X6 YLW CONV (MISCELLANEOUS) ×4 IMPLANT
PENCIL SMOKE EVACUATOR (MISCELLANEOUS) IMPLANT
SET COLLECT BLD 21X3/4 12 (NEEDLE) IMPLANT
SPONGE T-LAP 18X18 ~~LOC~~+RFID (SPONGE) IMPLANT
STOPCOCK 4 WAY LG BORE MALE ST (IV SETS) IMPLANT
SURGIFLO W/THROMBIN 8M KIT (HEMOSTASIS) IMPLANT
SUT GORETEX 5 0 TT13 24 (SUTURE) IMPLANT
SUT GORETEX 6.0 TT9 (SUTURE) IMPLANT
SUT PROLENE 5 0 C 1 24 (SUTURE) IMPLANT
SUT PROLENE 5 0 C 1 36 (SUTURE) IMPLANT
SUT PROLENE 6 0 BV (SUTURE) ×2 IMPLANT
SUT PROLENE 6 0 CC (SUTURE) IMPLANT
SUT SILK 2 0 SH (SUTURE) IMPLANT
SUT VIC AB 2-0 CT1 27 (SUTURE) ×4
SUT VIC AB 2-0 CT1 TAPERPNT 27 (SUTURE) ×4 IMPLANT
SUT VIC AB 3-0 SH 27 (SUTURE) ×4
SUT VIC AB 3-0 SH 27X BRD (SUTURE) ×4 IMPLANT
SUT VICRYL 4-0 PS2 18IN ABS (SUTURE) IMPLANT
TAPE UMBILICAL 1/8X30 (MISCELLANEOUS) IMPLANT
TOWEL GREEN STERILE (TOWEL DISPOSABLE) ×2 IMPLANT
TRAY FOLEY MTR SLVR 16FR STAT (SET/KITS/TRAYS/PACK) ×2 IMPLANT
TUBING EXTENTION W/L.L. (IV SETS) IMPLANT
VASCULAR TIE MINI RED 18IN STL (MISCELLANEOUS) IMPLANT
WATER STERILE IRR 1000ML POUR (IV SOLUTION) ×2 IMPLANT

## 2022-11-29 NOTE — Anesthesia Procedure Notes (Signed)
Arterial Line Insertion Start/End6/10/2022 1:12 PM, 11/29/2022 1:22 PM Performed by: Lewie Loron, MD, Randon Goldsmith, CRNA, CRNA  Patient location: Pre-op. Preanesthetic checklist: patient identified, IV checked, site marked, risks and benefits discussed, surgical consent, monitors and equipment checked, pre-op evaluation, timeout performed and anesthesia consent Lidocaine 1% used for infiltration Left, radial was placed Hand hygiene performed , maximum sterile barriers used  and Seldinger technique used  Attempts: 2 Procedure performed without using ultrasound guided technique. Following insertion, dressing applied and Biopatch. Post procedure assessment: normal  Patient tolerated the procedure well with no immediate complications.

## 2022-11-29 NOTE — Progress Notes (Addendum)
Given pt 12.5 mg of dextrose for CBG=60 per protocol. Called reported to short stay nurse.   Lawson Radar, RN

## 2022-11-29 NOTE — Transfer of Care (Signed)
Immediate Anesthesia Transfer of Care Note  Patient: Vanessa [name]  Procedure(s) Performed: RIGHT AXILLA ARTERY - RIGHT FEMORAL ARTERY BYPASS GRAFT USING 8mm X 70cm PROPATEN GORE GRAFT WITH REMOVABLE RINGS (Right) GROIN EXPOSURE OF RIGHT FEMORAL ARTERY (Right: Groin) APPLICATION OF WOUND VAC AND KERECIS MICRO WOUND GRAFT (Right: Groin)  Patient Location: PACU  Anesthesia Type:General  Level of Consciousness: awake, drowsy, and patient cooperative  Airway & Oxygen Therapy: Patient Spontanous Breathing and Patient connected to face mask oxygen  Post-op Assessment: Report given to RN and Post -op Vital signs reviewed and stable  Post vital signs: Reviewed and stable  Last Vitals:  Vitals Value Taken Time  BP 89/58 11/29/22 1630  Temp    Pulse 86 11/29/22 1635  Resp 16 11/29/22 1635  SpO2 96 % 11/29/22 1635  Vitals shown include unvalidated device data.  Last Pain:  Vitals:   11/29/22 1225  TempSrc: Oral  PainSc: 0-No pain      Patients Stated Pain Goal: 0 (11/29/22 0630)  Complications: No notable events documented.

## 2022-11-29 NOTE — Anesthesia Preprocedure Evaluation (Addendum)
Anesthesia Evaluation  Patient identified by MRN, date of birth, ID band Patient awake    Reviewed: Allergy & Precautions, NPO status , Patient's Chart, lab work & pertinent test results  Airway Mallampati: II  TM Distance: >3 FB     Dental   Pulmonary asthma , pneumonia, COPD, Current Smoker and Patient abstained from smoking.   breath sounds clear to auscultation       Cardiovascular hypertension, Pt. on medications  Rhythm:Regular Rate:Normal  Echo 11/2022  1. Left ventricular ejection fraction, by estimation, is 50 to 55%. The left ventricle has low normal function. The left ventricle has no regional wall motion abnormalities. Left ventricular diastolic parameters were normal.   2. Right ventricular systolic function is normal. The right ventricular size is normal. Tricuspid regurgitation signal is inadequate for assessing PA pressure.   3. Right atrial size was mildly dilated.   4. The mitral valve is normal in structure. No evidence of mitral valve regurgitation. No evidence of mitral stenosis.   5. The aortic valve was not well visualized. Aortic valve regurgitation is not visualized. No aortic stenosis is present.   6. The inferior vena cava is normal in size with greater than 50%  respiratory variability, suggesting right atrial pressure of 3 mmHg.     Neuro/Psych  PSYCHIATRIC DISORDERS Anxiety Depression       GI/Hepatic Neg liver ROS,GERD  Medicated,,  Endo/Other  diabetes    Renal/GU      Musculoskeletal   Abdominal   Peds  Hematology   Anesthesia Other Findings   Reproductive/Obstetrics                              Anesthesia Physical Anesthesia Plan  ASA: 3  Anesthesia Plan: General   Post-op Pain Management: Tylenol PO (pre-op)*   Induction: Intravenous  PONV Risk Score and Plan: 3 and Ondansetron, Dexamethasone, Midazolam and Treatment may vary due to age or medical  condition  Airway Management Planned: Oral ETT  Additional Equipment: Arterial line  Intra-op Plan:   Post-operative Plan: Extubation in OR  Informed Consent:   Plan Discussed with:   Anesthesia Plan Comments: (2 x PIV)        Anesthesia Quick Evaluation

## 2022-11-29 NOTE — Progress Notes (Addendum)
Pharmacy Antibiotic Note  Vanessa [name] is a 62 y.o. female admitted on 11/22/2022 with right foot cellulitis and osteomyelitis found to have severe PAD.  Pharmacy has been consulted for Vancomycin and Cefepime dosing.  Per vancomycin levels 6/4 and 6/5, AUC is 499 and at goal. VVS planning right axillary to femoral bypass graft 6/5.  Plan: Continue Vancomycin 1250 mg IV q12h Continue Cefepime 2gm IV q8h Monitor cultures, clinical status, renal function, vancomycin level Narrow abx as able and f/u duration    Height: 5\' 2"  (157.5 cm) Weight: 69.1 kg (152 lb 5.4 oz) IBW/kg (Calculated) : 50.1  Temp (24hrs), Avg:98.3 F (36.8 C), Min:97.8 F (36.6 C), Max:98.8 F (37.1 C)  Recent Labs  Lab 11/22/22 1154 11/22/22 1427 11/23/22 0635 11/24/22 0536 11/25/22 0107 11/26/22 1213 11/26/22 1720 11/27/22 0219 11/28/22 0247 11/28/22 2009 11/29/22 0604  WBC  --   --    < > 11.5* 13.6*  --   --  12.3* 11.4*  --  10.9*  CREATININE  --   --    < > 0.39* 0.50  --   --  0.39* 0.41*  --  0.39*  LATICACIDVEN 1.9 1.7  --   --   --   --   --   --   --   --   --   VANCOTROUGH  --   --   --   --   --   --   --   --   --   --  10*  VANCOPEAK  --   --   --   --   --  18*  --   --   --  37  --   VANCORANDOM  --   --   --   --   --   --  8  --   --   --   --    < > = values in this interval not displayed.     Estimated Creatinine Clearance: 67.3 mL/min (A) (by C-G formula based on SCr of 0.39 mg/dL (L)).    Allergies  Allergen Reactions   Jardiance [Empagliflozin] Other (See Comments)    Severe yeast infection   Latex Rash   Avandia [Rosiglitazone] Other (See Comments)    Headaches     Antimicrobials this admission: Vancomycin 5/29>> Cefepime 5/29>> Metronidazole x 1 on 5/29  Dose adjustments this admission: 6/2: VP 18, VR 5hr later= 8 on Vanc 1250 mg IV q24h > increased to q12h 6/5 VP 37, VT 10, AUC = 499  Microbiology results: 5/29 blood: ngtd  Thank you for allowing pharmacy to  be a part of this patient's care.  Alphia Moh, PharmD, BCPS, BCCP Clinical Pharmacist  Please check AMION for all [facility] phone numbers After 10:00 PM, call Main Pharmacy [phone]

## 2022-11-29 NOTE — Progress Notes (Signed)
  Subjective:  Patient ID: Vanessa [name], female    DOB: Jul 30, 1960,  MRN: [id number]  A 62 y.o. female with with history of severe peripheral vascular disease undergoing bypass today with gangrene to the right fourth and fifth digit.  Patient states she is doing okay minimal pain.  No nausea fever chills vomiting.  Patient is undergoing surgery today Objective:   Vitals:   11/29/22 0754 11/29/22 0855  BP: (!) 149/73   Pulse:    Resp: 15   Temp: 98.1 F (36.7 C)   SpO2:  98%   General AA&O x3. Normal mood and affect.  Vascular Dorsalis pedis and posterior tibial pulses nonpalpable Brisk capillary sluggish to all digits.  No pedal hair present.  Neurologic Epicritic sensation grossly intact.  Dermatologic Right fifth digit gangrene still demarcating.  No purulent drainage noted.  No erythema noted mild malodor present.  Orthopedic: MMT 5/5 in dorsiflexion, plantarflexion, inversion, and eversion. Normal joint ROM without pain or crepitus.    Assessment & Plan:  Patient was evaluated and treated and all questions answered.  Right fourth and fifth digit gangrene dry stable actively demarcating with history of severe peripheral vascular disease -All questions and concerns were discussed with the patient in extensive detail -At this time.  We are awaiting aortobifemoral bypass from vascular surgery -The digits are still actively demarcating without any clinical signs of infection. -Continue Betadine wet-to-dry dressing changes daily. -Will continue to clinically monitor her after the bypass graft. -Patient may need right fourth/fifth digit amputation if the gangrene worsens. -Weightbearing as tolerated with surgical shoe -Podiatry to continue following  Candelaria Stagers, DPM  Accessible via secure chat for questions or concerns.

## 2022-11-29 NOTE — Anesthesia Procedure Notes (Signed)
Procedure Name: Intubation Date/Time: 11/29/2022 1:41 PM  Performed by: Marena Chancy, CRNAPre-anesthesia Checklist: Patient identified, Emergency Drugs available, Suction available and Patient being monitored Patient Re-evaluated:Patient Re-evaluated prior to induction Oxygen Delivery Method: Circle System Utilized Preoxygenation: Pre-oxygenation with 100% oxygen Induction Type: IV induction Ventilation: Two handed mask ventilation required Laryngoscope Size: Mac and 4 Grade View: Grade I Tube type: Oral Tube size: 7.5 mm Number of attempts: 1 Airway Equipment and Method: Stylet and Oral airway Placement Confirmation: ETT inserted through vocal cords under direct vision, positive ETCO2 and breath sounds checked- equal and bilateral Secured at: 22 cm Tube secured with: Tape Dental Injury: Teeth and Oropharynx as per pre-operative assessment

## 2022-11-29 NOTE — Inpatient Diabetes Management (Signed)
Inpatient Diabetes Program Recommendations  AACE/ADA: New Consensus Statement on Inpatient Glycemic Control (2015)  Target Ranges:  Prepandial:   less than 140 mg/dL      Peak postprandial:   less than 180 mg/dL (1-2 hours)      Critically ill patients:  140 - 180 mg/dL   Lab Results  Component Value Date   GLUCAP 76 11/29/2022   HGBA1C 9.8 (H) 11/22/2022    Review of Glycemic Control  Latest Reference Range & Units 11/28/22 06:05 11/28/22 11:29 11/28/22 12:07 11/28/22 16:33 11/28/22 21:05 11/29/22 05:51  Glucose-Capillary 70 - 99 mg/dL 85 59 (L) 81 132 (H) 440 (H) 76  (L): Data is abnormally low (H): Data is abnormally high  Diabetes history: DM2 Outpatient Diabetes medications: Soliqua 30 units QD Current orders for Inpatient glycemic control: Novolog 0-15 units TID and Semglee 5 units QHS  Inpatient Diabetes Program Recommendations:    Please consider decreasing correction to:  Novolog 0-9 units TID.  Has not required rapid insulin since 11/27/22.    Will continue to follow while inpatient.  Thank you, Dulce Sellar, MSN, CDCES Diabetes Coordinator Inpatient Diabetes Program [phone] (team pager from 8a-5p)

## 2022-11-29 NOTE — Interval H&P Note (Signed)
History and Physical Interval Note:  11/29/2022 12:48 PM  Vanessa [name]  has presented today for surgery, with the diagnosis of PVD.  The various methods of treatment have been discussed with the patient and family. After consideration of risks, benefits and other options for treatment, the patient has consented to  Procedure(s): BYPASS GRAFT RIGHT AXILLA-FEMORAL (Right) as a surgical intervention.  The patient's history has been reviewed, patient examined, no change in status, stable for surgery.  I have reviewed the patient's chart and labs.  Questions were answered to the patient's satisfaction.     [name]

## 2022-11-29 NOTE — Progress Notes (Signed)
Pt came back to rm 24 from PACU. Reinitiated tele. Obtained VS. Call bell within reach.   Lawson Radar, RN

## 2022-11-29 NOTE — Progress Notes (Signed)
HD#7 Subjective:   Summary: Vanessa [name] is a 62 y.o. female with pertinent PMH of T2DM, COPD, HTN, and IBS who presents with progressively worsening ulcer on her right foot and is admitted for right foot osteomyelitis.  She is anxious about her surgery today but is ready to get through it.  She denies any new or worsening issues.  No dyspnea overnight.  Objective:  Vital signs in last 24 hours: Vitals:   11/28/22 1922 11/28/22 2320 11/29/22 0234 11/29/22 0500  BP: (!) 162/68 (!) 159/88 (!) 181/83   Pulse: 78 76 75   Resp: 20 18 16    Temp: 98.8 F (37.1 C) 97.8 F (36.6 C) 98.5 F (36.9 C)   TempSrc: Oral Oral Oral   SpO2: 96% 99% 98%   Weight:    69.1 kg  Height:       Supplemental O2: Nasal Cannula SpO2: 98 % O2 Flow Rate (L/min): 2 L/min   Physical Exam:  Constitutional: Elderly female. In no acute distress. Cardio:Regular rate and rhythm. Pulm: Normal work of breathing on 2 L nasal cannula Neuro:Alert and oriented x3. No focal deficit noted.  Filed Weights   11/27/22 0601 11/28/22 0500 11/29/22 0500  Weight: 69.1 kg 69.1 kg 69.1 kg      Intake/Output Summary (Last 24 hours) at 11/29/2022 0653 Last data filed at 11/28/2022 2000 Gross per 24 hour  Intake 240 ml  Output 1200 ml  Net -960 ml   Net IO Since Admission: -2,868.99 mL [11/29/22 0653]  Pertinent Labs:    Latest Ref Rng & Units 11/29/2022    6:04 AM 11/28/2022    2:47 AM 11/27/2022    2:19 AM  CBC  WBC 4.0 - 10.5 K/uL 10.9  11.4  12.3   Hemoglobin 12.0 - 15.0 g/dL 16.1  09.6  04.5   Hematocrit 36.0 - 46.0 % 35.8  37.0  36.6   Platelets 150 - 400 K/uL 428  431  376        Latest Ref Rng & Units 11/29/2022    6:04 AM 11/28/2022    2:47 AM 11/27/2022    2:19 AM  CMP  Glucose 70 - 99 mg/dL 76  64  409   BUN 8 - 23 mg/dL 7  7  6    Creatinine 0.44 - 1.00 mg/dL 8.11  9.14  7.82   Sodium 135 - 145 mmol/L 136  136  135   Potassium 3.5 - 5.1 mmol/L 3.3  3.5  3.8   Chloride 98 - 111 mmol/L 97  97  98    CO2 22 - 32 mmol/L 30  32  29   Calcium 8.9 - 10.3 mg/dL 8.7  8.9  8.4     Assessment/Plan:   Principal Problem:   Osteomyelitis (HCC)   Patient Summary: Vanessa [name] is a 62 y.o. female with pertinent PMH of T2DM, COPD, HTN, and IBS who presents with progressively worsening ulcer on her right foot and is admitted for right foot osteomyelitis and currently pending vascular or surgical intervention, on hospital day 7.  Osteomyelitis of the right foot fourth phalanx Cellulitis of the right foot Nonhealing plantar ulcer on the right foot Peripheral arterial disease Since admission patient has remained hemodynamically stable and wound has not progressed.  Unfortunately due to her severe arterial disease involving extensive portions of the aorta she will need extensive open revascularization.  Will be reevaluated by podiatry after recovery from this surgery.  Appreciate vascular and  podiatry.  Blood cultures no growth at 5 days. - Plan for axillary femoral bypass graft at 1:30 PM today - Continue vancomycin and cefepime - Pain control with Tylenol 1000 mg every 8 hours and Dilaudid 1 mg IV every 2 hours as needed  HFmrEF After getting fluids following contrasted studies she developed some pulmonary vascular congestion with increased oxygen requirements up to 5 L nasal cannula.  TTE for cardiac clearance showed LVEF of 50 to 55%.  Responded well to IV Lasix 40 mg x2. - wean down to room air if possible but will reevaluate after surgery today for any further diuresis   T2DM A1c 10/25/2022 was 10.6 and 9.8 here.  She is on Soliqua 30 units nightly at home which she was recently started on in our clinic.  She does want a Dexcom which we will address closer to discharge or on follow-up.  Fasting glucose in the 80s yesterday morning and 76 this morning.  Surgery today as above.  We have decreased her Semglee and will monitor closely today. - Semglee 5 units nightly and moderate SSI -- Continue CBG  monitoring, CM diet   COPD On 2 L nasal cannula due to pulmonary edema as above. Not consistent with COPD exacerbation and treating for heart failure --Continue supplemental O2 for O2 goal 88-92% and incentive spirometer --Continue home inhaler equivalents with Breo Ellipta, Incruse Ellipta, and albuterol.  Will optimize with Trelegy on discharge.   Hypertension Normotensive here and is on lisinopril 10 mg daily at home. - Continue lisinopril 10 mg daily   Anxiety/depression Tobacco use disorder Will continue home medications of bupropion and citalopram. - Bupropion 150 mg twice daily, citalopram 40 mg daily, and nicotine patch daily as needed   HLD Repeat lipid panel showed LDL of 129.   --Continue home atorvastatin 80 mg daily and add ezetimibe 10 mg daily  Diet: Carb-Modified VTE: Enoxaparin Code: Full  Dispo: Anticipated discharge home versus short-term rehab depending on bypass today and reevaluation by podiatry after.  Rocky Morel, DO 11/29/2022, 6:53 AM IM Resident, PGY-3 Pager: [phone] Internal Medicine Teaching Service Isaiah 41:10

## 2022-11-29 NOTE — Op Note (Signed)
Patient name: Vanessa [name] MRN: [id number] DOB: 02-25-1961 Sex: female  11/29/2022 Pre-operative Diagnosis: Right foot ulcer Post-operative diagnosis:  Same Surgeon:  [name] Assistants:  Clotilde Dieter, MD, Adonis Housekeeper, PA Procedure:   #1: Right axillary artery to common femoral artery bypass graft with 8 mm ringed PTFE   #2: Placement of skin substitute, Kerecis first 28 cm)   #3: Prevena wound VAC Anesthesia:  General Blood Loss:  Minimal Specimens:  None   Indications: This is a 62 year old female with right foot wound.  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  Because of her underlying COPD and overall medical issues with deconditioning, I do not feel that she is a candidate for open abdominal surgery.  In addition she has had complications from GYN surgery in the past and likely has a fair amount of adhesions.  Therefore I elected to proceed with a right axillary to common femoral bypass graft.  She is asymptomatic on the left side.  I discussed that this likely will have a short-term patency rate however the goal is to get her to heal her wound.  She understands this and wishes to proceed.  Procedure:  The patient was identified in the holding area and taken to [facility] OR ROOM 11  The patient was then placed supine on the table. general anesthesia was administered.  The patient was prepped and draped in the usual sterile fashion.  A time out was called and antibiotics were administered.  An assistant was necessary to expedite the procedure.  The PA help with exposure by providing suction and retraction.  She help with the anastomoses by following the suture and she help with closure.  Dr. Chestine Spore help with exposure of the right femoral artery  A transverse infraclavicular incision was made 1 fingerbreadth below the clavicle.  Cautery was used about subcutaneous tissue down to the fascia which was divided with cautery.  I then developed an avascular  plane between the pectoralis muscle fibers.  I then identified the subclavian vein which was retracted caudad.  There were multiple venous branches that were ligated.  Ultimately I identified the subclavian artery which was circumferentially exposed and encircled with Vesseloops.  Next, an oblique incision was made in the right groin.  Cautery used divide subcutaneous tissue down to the femoral sheath which was opened sharply.  The common femoral artery was dissected out from the inguinal ligament down to the bifurcation.  The superficial femoral and profundofemoral artery were individually isolated.  Next, I divided a portion of the pectoralis minor muscle.  A Gore tunneler was used to create a tunnel between the 2 incisions going anterior to the iliac crest.  A 8 mm external ring PTFE graft was brought through the tunnel.  The patient was then fully heparinized.  After the heparin circulated, the subclavian artery was occluded with vascular clamps.  A #11 blade was used to make an arteriotomy which was extended longitudinally with Potts scissors.  The graft was then spatulated to the size the arteriotomy.  A running anastomosis was created with 6-0 Prolene.  Prior to completion, the appropriate flushing maneuvers were performed and the anastomosis was completed.  There was excellent flow through the graft which was flushed with heparin saline and reoccluded.  Next, the common femoral and profundofemoral and superficial femoral artery were occluded.  #11 blade was used to make an arteriotomy in the common femoral artery.  There was a posterior plaque but  no significant stenosis.  I made sure the graft was cut to the appropriate length and there were no kinks.  It was spatulated to fit the size the arteriotomy.  A running anastomosis was graded with 6-0 Prolene.  Prior to completion, the appropriate flushing maneuvers were performed the anastomosis was completed.  Blood flow was reestablished to the right leg.   Hand-held Doppler was used to evaluate signals within the profundofemoral and superficial femoral artery which are multiphasic.  Next, 50 mg of protamine was used to reverse the heparin.  The wound was irrigated.  Hemostasis was achieved.  The femoral sheath was reapproximated with 2-0 Vicryl.  I then placed Kerecis powder in the wound and closed another layer with 3-0 Vicryl.  A another portion of Kerecis was placed and the skin was closed with a 4-0 Vicryl.  The axillary incision was closed by reapproximating the pectoral fascia with 2-0 Vicryl.  The subcutaneous tissue was closed with 3-0 Vicryl followed by subcuticular closure and Dermabond.  A Prevena wound VAC was placed in the groin.  The patient tolerated procedure well there were no immediate complications.  She was taken recovery in stable condition   Disposition: To PACU stable.   Juleen China, M.D., [facility] [place] Office: [phone] Pager:  [phone]

## 2022-11-29 NOTE — Progress Notes (Signed)
  Progress Note    11/29/2022 8:26 AM 5 Days Post-Op  Critical limb ischemia with tissue loss of RLE Scheduled for right Axillary femoral bypass today in OR with Dr. Myra Gianotti  Consent order placed NPO  She did not have any questions regarding her surgery today  Graceann Congress, PA-C [facility] [phone] 11/29/2022 8:26 AM

## 2022-11-30 ENCOUNTER — Encounter (HOSPITAL_COMMUNITY): Payer: Self-pay | Admitting: Surgery

## 2022-11-30 DIAGNOSIS — L03115 Cellulitis of right lower limb: Secondary | ICD-10-CM | POA: Diagnosis not present

## 2022-11-30 DIAGNOSIS — E1169 Type 2 diabetes mellitus with other specified complication: Secondary | ICD-10-CM | POA: Diagnosis not present

## 2022-11-30 DIAGNOSIS — M869 Osteomyelitis, unspecified: Secondary | ICD-10-CM | POA: Diagnosis not present

## 2022-11-30 DIAGNOSIS — M86171 Other acute osteomyelitis, right ankle and foot: Secondary | ICD-10-CM | POA: Diagnosis not present

## 2022-11-30 DIAGNOSIS — E1152 Type 2 diabetes mellitus with diabetic peripheral angiopathy with gangrene: Secondary | ICD-10-CM

## 2022-11-30 LAB — LIPID PANEL
Cholesterol: 113 mg/dL (ref 0–200)
HDL: 25 mg/dL — ABNORMAL LOW (ref >40)
LDL Cholesterol: 70 mg/dL (ref 0–99)
Total CHOL/HDL Ratio: 4.5 RATIO
Triglycerides: 92 mg/dL (ref <150)
VLDL: 18 mg/dL (ref 0–40)

## 2022-11-30 LAB — CBC WITH DIFFERENTIAL/PLATELET
Abs Immature Granulocytes: 0.06 10*3/uL (ref 0.00–0.07)
Basophils Absolute: 0.1 10*3/uL (ref 0.0–0.1)
Basophils Relative: 1 %
Eosinophils Absolute: 0.1 10*3/uL (ref 0.0–0.5)
Eosinophils Relative: 1 %
HCT: 34.4 % — ABNORMAL LOW (ref 36.0–46.0)
Hemoglobin: 11 g/dL — ABNORMAL LOW (ref 12.0–15.0)
Immature Granulocytes: 1 %
Lymphocytes Relative: 13 %
Lymphs Abs: 1.6 10*3/uL (ref 0.7–4.0)
MCH: 29.7 pg (ref 26.0–34.0)
MCHC: 32 g/dL (ref 30.0–36.0)
MCV: 93 fL (ref 80.0–100.0)
Monocytes Absolute: 0.6 10*3/uL (ref 0.1–1.0)
Monocytes Relative: 5 %
Neutro Abs: 9.9 10*3/uL — ABNORMAL HIGH (ref 1.7–7.7)
Neutrophils Relative %: 79 %
Platelets: 395 10*3/uL (ref 150–400)
RBC: 3.7 MIL/uL — ABNORMAL LOW (ref 3.87–5.11)
RDW: 14.2 % (ref 11.5–15.5)
WBC: 12.4 10*3/uL — ABNORMAL HIGH (ref 4.0–10.5)
nRBC: 0 % (ref 0.0–0.2)

## 2022-11-30 LAB — RENAL FUNCTION PANEL
Albumin: 2.4 g/dL — ABNORMAL LOW (ref 3.5–5.0)
Anion gap: 8 (ref 5–15)
BUN: 12 mg/dL (ref 8–23)
CO2: 28 mmol/L (ref 22–32)
Calcium: 8.6 mg/dL — ABNORMAL LOW (ref 8.9–10.3)
Chloride: 95 mmol/L — ABNORMAL LOW (ref 98–111)
Creatinine, Ser: 0.42 mg/dL — ABNORMAL LOW (ref 0.44–1.00)
GFR, Estimated: >60 mL/min (ref >60)
Glucose, Bld: 295 mg/dL — ABNORMAL HIGH (ref 70–99)
Phosphorus: 3.5 mg/dL (ref 2.5–4.6)
Potassium: 4 mmol/L (ref 3.5–5.1)
Sodium: 131 mmol/L — ABNORMAL LOW (ref 135–145)

## 2022-11-30 LAB — GLUCOSE, CAPILLARY
Glucose-Capillary: 297 mg/dL — ABNORMAL HIGH (ref 70–99)
Glucose-Capillary: 99 mg/dL (ref 70–99)
Glucose-Capillary: 100 mg/dL — ABNORMAL HIGH (ref 70–99)
Glucose-Capillary: 196 mg/dL — ABNORMAL HIGH (ref 70–99)

## 2022-11-30 MED ORDER — OXYCODONE HCL 5 MG PO TABS
5.0000 mg | ORAL_TABLET | ORAL | Status: DC | PRN
  Administered 2022-11-30 – 2022-12-06 (×9): 5 mg via ORAL
  Filled 2022-11-30 (×10): qty 1

## 2022-11-30 MED ORDER — HYDROMORPHONE HCL 1 MG/ML IJ SOLN: 0.5000 mg | INTRAMUSCULAR | Status: DC | PRN

## 2022-11-30 MED ORDER — HYDROMORPHONE HCL 1 MG/ML IJ SOLN
0.5000 mg | INTRAMUSCULAR | Status: DC | PRN
  Administered 2022-11-30 – 2022-12-05 (×13): 0.5 mg via INTRAVENOUS
  Filled 2022-11-30 (×13): qty 0.5

## 2022-11-30 MED ORDER — INSULIN GLARGINE-YFGN 100 UNIT/ML ~~LOC~~ SOLN
10.0000 [IU] | Freq: Every day | SUBCUTANEOUS | Status: DC
  Administered 2022-11-30: 10 [IU] via SUBCUTANEOUS
  Filled 2022-11-30 (×2): qty 0.1

## 2022-11-30 MED ORDER — SODIUM CHLORIDE 0.9 % IV SOLN
2.0000 g | INTRAVENOUS | Status: DC
  Administered 2022-11-30 – 2022-12-04 (×5): 2 g via INTRAVENOUS
  Filled 2022-11-30 (×5): qty 20

## 2022-11-30 NOTE — Progress Notes (Signed)
HD#8 Subjective:   Summary: Vanessa [name] is a 62 y.o. female with pertinent PMH of T2DM, COPD, HTN, and IBS who presents with progressively worsening ulcer on her right foot and is admitted for right foot osteomyelitis.  Doing well this morning without any issues after surgery yesterday. No increased right leg pain. No dyspnea.   Objective:  Vital signs in last 24 hours: Vitals:   11/29/22 1925 11/29/22 2330 11/30/22 0326 11/30/22 0808  BP: 95/65 94/61 (!) 102/58 (!) 91/43  Pulse: 82 67 60 66  Resp: 16 16 14 20   Temp:  98.1 F (36.7 C) 98 F (36.7 C) 98 F (36.7 C)  TempSrc: Oral Oral Oral Oral  SpO2: 96% 95% 100% 100%  Weight:      Height:       Supplemental O2: room air SpO2: 100 % O2 Flow Rate (L/min): 3 L/min   Physical Exam:  Constitutional: Elderly female. In no acute distress. Cardio:Regular rate and rhythm. Pulm: Normal work of breathing on room air MSK/skin: Roughly 2 cm area of ulceration on the plantar aspect of the right foot beneath the fourth and fifth toes that extends laterally around the fifth toe base. Improve gangrenous tissue on the right fifth toe.  Neuro:Alert and oriented x3. No focal deficit noted.  Filed Weights   11/27/22 0601 11/28/22 0500 11/29/22 0500  Weight: 69.1 kg 69.1 kg 69.1 kg      Intake/Output Summary (Last 24 hours) at 11/30/2022 1011 Last data filed at 11/30/2022 0439 Gross per 24 hour  Intake 2823.72 ml  Output 950 ml  Net 1873.72 ml   Net IO Since Admission: -995.27 mL [11/30/22 1011]  Pertinent Labs:    Latest Ref Rng & Units 11/30/2022    6:30 AM 11/29/2022    4:23 PM 11/29/2022    6:04 AM  CBC  WBC 4.0 - 10.5 K/uL 12.4   10.9   Hemoglobin 12.0 - 15.0 g/dL 81.1  91.4  78.2   Hematocrit 36.0 - 46.0 % 34.4  37.0  35.8   Platelets 150 - 400 K/uL 395   428        Latest Ref Rng & Units 11/30/2022    6:30 AM 11/29/2022    4:23 PM 11/29/2022    6:04 AM  CMP  Glucose 70 - 99 mg/dL 956   76   BUN 8 - 23 mg/dL 12   7    Creatinine 2.13 - 1.00 mg/dL 0.86   5.78   Sodium 469 - 145 mmol/L 131  138  136   Potassium 3.5 - 5.1 mmol/L 4.0  4.3  3.3   Chloride 98 - 111 mmol/L 95   97   CO2 22 - 32 mmol/L 28   30   Calcium 8.9 - 10.3 mg/dL 8.6   8.7     Assessment/Plan:   Principal Problem:   Toe osteomyelitis, right (HCC) Active Problems:   Gangrene of toe of right foot (HCC)   Patient Summary: Vanessa [name] is a 62 y.o. female with pertinent PMH of T2DM, COPD, HTN, and IBS who presents with progressively worsening ulcer on her right foot and is admitted for right foot osteomyelitis and currently pending vascular or surgical intervention, on hospital day 8.  Osteomyelitis of the right foot fourth phalanx with gangrenous fifth toe Cellulitis of the right foot, resolved Nonhealing plantar ulcer on the right foot Peripheral arterial disease Doing well postop axillary femoral bypass graft on 11/29/2022.  Gangrene  on her fifth toe right foot is improving.  Foot has good Doppler pulses and appears more perfused generally.  Will narrow antibiotics without signs of significant infection. -Plan for podiatry intervention after she is healed from vascular surgery - Ceftriaxone - Pain control with Tylenol 1000 mg every 8 hours and Dilaudid 1 mg IV every 2 hours as needed  HFmrEF TTE for cardiac clearance showed LVEF of 50 to 55%.  Had some dyspnea and new oxygen requirements after getting IV fluids but responded well to IV Lasix 40 mg x2.  She is on room air this morning with O2 sats of 98-100.   T2DM A1c 10/25/2022 was 10.6 and 9.8 here.  She is on Soliqua 30 units nightly at home which she was recently started on in our clinic.  She does want a Dexcom which we will address closer to discharge or on follow-up.  Had some low sugars and was n.p.o. for surgery yesterday.  Sugar is elevated this morning and we will plan to increase her Semglee tonight from 5 to 10 units. - Semglee 10 units nightly and moderate SSI --  Continue CBG monitoring, CM diet   COPD --incentive spirometer --Continue home inhaler equivalents with Breo Ellipta, Incruse Ellipta, and albuterol.  Will optimize with Trelegy on discharge.   Hypertension Normotensive here and is on lisinopril 10 mg daily at home. - Continue lisinopril 10 mg daily   Anxiety/depression Tobacco use disorder Will continue home medications of bupropion and citalopram.  Will need continued tobacco cessation counseling going forward. - Bupropion 150 mg twice daily, citalopram 40 mg daily, and nicotine patch daily as needed   HLD Repeat lipid panel showed LDL of 129.   --Continue home atorvastatin 80 mg daily and add ezetimibe 10 mg daily  Diet: Carb-Modified VTE: Enoxaparin Code: Full  Dispo: Anticipated discharge home versus short-term rehab depending on postop course after axillary to femoral bypass graft on 6/5 and planned podiatry intervention for her right foot wound.  Rocky Morel, DO 11/30/2022, 10:11 AM IM Resident, PGY-3 Pager: [phone] Internal Medicine Teaching Service Isaiah 41:10

## 2022-11-30 NOTE — Anesthesia Postprocedure Evaluation (Signed)
Anesthesia Post Note  Patient: Vanessa [name]  Procedure(s) Performed: RIGHT AXILLA ARTERY - RIGHT FEMORAL ARTERY BYPASS GRAFT USING 8mm X 70cm PROPATEN GORE GRAFT WITH REMOVABLE RINGS (Right) GROIN EXPOSURE OF RIGHT FEMORAL ARTERY (Right: Groin) APPLICATION OF WOUND VAC AND KERECIS MICRO WOUND GRAFT (Right: Groin)     Patient location during evaluation: PACU Anesthesia Type: General Level of consciousness: sedated and patient cooperative Pain management: pain level controlled Vital Signs Assessment: post-procedure vital signs reviewed and stable Respiratory status: spontaneous breathing Cardiovascular status: stable Anesthetic complications: no   No notable events documented.  Last Vitals:  Vitals:   11/30/22 1729 11/30/22 1942  BP: (!) 92/52 (!) 123/53  Pulse:  84  Resp: 18 20  Temp:  36.7 C  SpO2:  99%    Last Pain:  Vitals:   11/30/22 1942  TempSrc: Oral  PainSc:                  Lewie Loron

## 2022-11-30 NOTE — Evaluation (Signed)
Physical Therapy Evaluation Patient Details Name: Vanessa [name] MRN: [id number] DOB: [birth year] Today's Date: 11/30/2022  History of Present Illness  The pt is a 62 yo female presenting 5/29 with R foot wound. 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 No endovascular revascularization options. Now s/p R axillary to common femoral artery bypass on 6/5. PMH includes: uncontrolled DM II, PAD, COPD, HTN, tobacco use, and IBS.   Clinical Impression  Pt in bed upon arrival of PT, agreeable to evaluation at this time. Prior to admission the pt was independent with mobility in the home, reports use of electric scooter for longer-distance activity (such as walking around grocery store). The pt now presents with limitations in functional mobility, strength, stability, and activity tolerance due to above dx, resulting pain, and chronic debility, and will continue to benefit from skilled PT to address these deficits. The pt was able to complete bed mobility and initial sit-stand with supervision, but she did rely on UE support on furniture in room and rails in hallway for balance and to manage R foot pain with gait. Pt continued to deny need for DME through session however. The pt reports sensation in BLE is improved and there is no difference between RLE and LLE after surgery. She reports her main limitation is pain in her R foot due to gangrene of 4th and 5th toes. Anticipate pt will continue to progress towards her baseline level of mobility and will be safe to return home, however the pt would likely benefit from generally increased mobility and activity after d/c and will benefit from skilled PT acutely to progress endurance and establish walking program and HEP.         Recommendations for follow up therapy are one component of a multi-disciplinary discharge planning process, led by the  attending physician.  Recommendations may be updated based on patient status, additional functional criteria and insurance authorization.  Follow Up Recommendations       Assistance Recommended at Discharge Intermittent Supervision/Assistance  Patient can return home with the following  A little help with walking and/or transfers;A little help with bathing/dressing/bathroom;Assistance with cooking/housework;Assist for transportation;Help with stairs or ramp for entrance    Equipment Recommendations None recommended by PT  Recommendations for Other Services       Functional Status Assessment Patient has had a recent decline in their functional status and demonstrates the ability to make significant improvements in function in a reasonable and predictable amount of time.     Precautions / Restrictions Precautions Precautions: Fall Precaution Comments: pt with wound vac Restrictions Weight Bearing Restrictions: No      Mobility  Bed Mobility Overal bed mobility: Needs Assistance Bed Mobility: Supine to Sit     Supine to sit: Supervision, HOB elevated     General bed mobility comments: supervision for lines, pt elevating HOB and using bed rails to complete    Transfers Overall transfer level: Needs assistance   Transfers: Sit to/from Stand Sit to Stand: Min guard           General transfer comment: pt denies need for assist or DME, reaching for furniture in room to steady after standing. no instances of buckling in stance    Ambulation/Gait Ambulation/Gait assistance: Min guard Gait Distance (Feet): 75 Feet Assistive device: None (pt holding furniture in room and rails in hall, denies need for UE support) Gait Pattern/deviations: Step-to  pattern, Decreased stride length, Decreased weight shift to right, Decreased stance time - right, Decreased step length - left, Shuffle, Antalgic Gait velocity: decreased Gait velocity interpretation: <1.31 ft/sec, indicative of  household ambulator   General Gait Details: pt with minimal wt shift to R due to pain in toes. reports she has a shoe that helps in her pt belonging bag (did not mention until after ambulation). no overt buckling or LOB but reaching for UE support on objects and rails.     Balance Overall balance assessment: Needs assistance Sitting-balance support: No upper extremity supported, Feet supported Sitting balance-Leahy Scale: Good     Standing balance support: Single extremity supported, During functional activity Standing balance-Leahy Scale: Fair                               Pertinent Vitals/Pain Pain Assessment Pain Assessment: 0-10 Pain Score: 8  Pain Location: R lower abdomen Pain Descriptors / Indicators: Throbbing Pain Intervention(s): Limited activity within patient's tolerance, Monitored during session    Home Living Family/patient expects to be discharged to:: Private residence Living Arrangements: Spouse/significant other Available Help at Discharge: Available PRN/intermittently;Family Type of Home: Mobile home Home Access: Ramped entrance       Home Layout: One level (one step down to dining room and laundry room) Home Equipment: Agricultural consultant (2 wheels);Shower seat;BSC/3in1;Wheelchair - manual;Grab bars - tub/shower;Grab bars - toilet;Hand held shower head;Transport chair      Prior Function Prior Level of Function : Independent/Modified Independent             Mobility Comments: ambulates without AD, has a level of agoraphobia and does not go out often, is able to ambulate limited community distances although also reports use of scooter on occasion. ADLs Comments: no use of DME in the home, uses scooter for long distance due to pain in legs with community mobility     Hand Dominance   Dominant Hand: Right    Extremity/Trunk Assessment   Upper Extremity Assessment Upper Extremity Assessment: Defer to OT evaluation    Lower Extremity  Assessment Lower Extremity Assessment: Generalized weakness;RLE deficits/detail;LLE deficits/detail RLE Deficits / Details: pt with dry gangrene of 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 grossly 4/5 to MMT. pt reports sensation better compared to before surgery, no differences between RLE and LLE LLE Deficits / Details: grossly 4/5 to MMT. pt reports sensation better compared to before surgery, no differences between RLE and LLE    Cervical / Trunk Assessment Cervical / Trunk Assessment: Kyphotic  Communication   Communication: No difficulties  Cognition Arousal/Alertness: Awake/alert Behavior During Therapy: WFL for tasks assessed/performed Overall Cognitive Status: Within Functional Limits for tasks assessed                                 General Comments: pt at times agrumentative, but demos good awareness and is able to follow all commands        General Comments General comments (skin integrity, edema, etc.): VSS on RA        Assessment/Plan    PT Assessment Patient needs continued PT services  PT Problem List Decreased balance;Decreased activity tolerance       PT Treatment Interventions Gait training;Functional mobility training;Therapeutic activities;Therapeutic exercise;Balance training    PT Goals (Current goals can be found in the Care Plan section)  Acute Rehab PT Goals Patient  Stated Goal: return home PT Goal Formulation: With patient Time For Goal Achievement: 12/14/22 Potential to Achieve Goals: Good    Frequency Min 1X/week     Co-evaluation PT/OT/SLP Co-Evaluation/Treatment: Yes Reason for Co-Treatment:  (poor activity tolerance) PT goals addressed during session: Mobility/safety with mobility;Balance;Proper use of DME         AM-PAC PT "6 Clicks" Mobility  Outcome Measure Help needed turning from your back to your side while in a flat bed without using bedrails?: None Help needed moving from lying on your back to sitting  on the side of a flat bed without using bedrails?: None Help needed moving to and from a bed to a chair (including a wheelchair)?: A Little Help needed standing up from a chair using your arms (e.g., wheelchair or bedside chair)?: A Little Help needed to walk in hospital room?: A Little Help needed climbing 3-5 steps with a railing? : A Lot 6 Click Score: 19    End of Session   Activity Tolerance: Patient tolerated treatment well Patient left: in chair;with call bell/phone within reach;with chair alarm set Nurse Communication: Mobility status PT Visit Diagnosis: Other abnormalities of gait and mobility (R26.89);Muscle weakness (generalized) (M62.81)    Time: 2956-2130 PT Time Calculation (min) (ACUTE ONLY): 23 min   Charges:   PT Evaluation $PT Eval Low Complexity: 1 Low          Vickki Muff, PT, DPT   Acute Rehabilitation Department Office [phone] Secure Chat Communication Preferred  Ronnie Derby 11/30/2022, 11:46 AM

## 2022-11-30 NOTE — Progress Notes (Signed)
   PODIATRY PROGRESS NOTE Patient Name: Vanessa [name]  DOB 07/09/1960 DOA 11/22/2022  Hospital Day: 22  Assessment:  62 y.o. female with severe PAD, T2DM, COPD, HTN, and IBS with gangrene and underlying osteomyelitis of the 5th toe and possibly 4th toe of the right foot.  AF, VSS  WBC: 12.4   Wound/Bone Cultures: pending OR  Imaging: MRI Foot R WO contrast 1. Abnormal marrow edema in the proximal phalanx of the fourth toe, compatible with osteomyelitis. There is also some localized subcutaneous edema below the fourth MTP joint and in the soft tissues of the proximal fourth toe, cellulitis is a distinct possibility.  Plan:  - Plan for amputation of the 5th digit and possibly the 4th with possibly partial ray amputation this weekend likely Saturday if cleared from vascular standpoint.  -Will attempt to clarify MRI read with radiology as 5th toe is clinically most affected vs 4th.  - S/p ax fem bypass per vascular, appreciate - Anticoag per primary ok to continue for procedure -IV Abx per primary, continue - Betadine pain daily to the digits on the  right foot Will continue to follow        Corinna Gab, DPM [facility]    Subjective:  Patient seen bedside, says having pain related to the vascular surgery. Otherwise doing alright. Aware of potential need for amputation this admission.   Objective:   Vitals:   11/30/22 1632 11/30/22 1729  BP: (!) 106/48 (!) 92/52  Pulse: 70   Resp: 20 18  Temp: 98.3 F (36.8 C)   SpO2: 99%        Latest Ref Rng & Units 11/30/2022    6:30 AM 11/29/2022    4:23 PM 11/29/2022    6:04 AM  CBC  WBC 4.0 - 10.5 K/uL 12.4   10.9   Hemoglobin 12.0 - 15.0 g/dL 16.1  09.6  04.5   Hematocrit 36.0 - 46.0 % 34.4  37.0  35.8   Platelets 150 - 400 K/uL 395   428        Latest Ref Rng & Units 11/30/2022    6:30 AM 11/29/2022    4:23 PM 11/29/2022    6:04 AM  BMP  Glucose 70 - 99 mg/dL 409   76   BUN 8 - 23 mg/dL 12   7   Creatinine  8.11 - 1.00 mg/dL 9.14   7.82   Sodium 956 - 145 mmol/L 131  138  136   Potassium 3.5 - 5.1 mmol/L 4.0  4.3  3.3   Chloride 98 - 111 mmol/L 95   97   CO2 22 - 32 mmol/L 28   30   Calcium 8.9 - 10.3 mg/dL 8.6   8.7     General: AAOx3, NAD  Lower Extremity Exam General AA&O x3. Normal mood and affect.  Vascular Dorsalis pedis and posterior tibial pulses nonpalpable Brisk capillary sluggish to all digits.  No pedal hair present.  Neurologic Epicritic sensation grossly intact.  Dermatologic Right fifth digit gangrene still demarcating.  No purulent drainage noted.  No erythema noted mild malodor present.      Orthopedic: MMT 5/5 in dorsiflexion, plantarflexion, inversion, and eversion. Normal joint ROM without pain or crepitus.     Radiology:  Results reviewed. See assessment for pertinent imaging results

## 2022-11-30 NOTE — Progress Notes (Addendum)
Hold lisinopril for bp=91/43. MD notified.   Lawson Radar, RN

## 2022-11-30 NOTE — Evaluation (Signed)
Occupational Therapy Evaluation and Discharge Patient Details Name: Vanessa [name] MRN: [id number] DOB: 12/01/1960 Today's Date: 11/30/2022   History of Present Illness The pt is a 62 yo female presenting 5/29 with R foot wound. 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 No endovascular revascularization options. Now s/p R axillary to common femoral artery bypass on 6/5. PMH includes: uncontrolled DM II, PAD, COPD, HTN, tobacco use, and IBS.   Clinical Impression   This 62 yo female admitted and underwent above presents to acute OT with PLOF of being totally independent with all basic ADLs and IADLs. Currently she is limited by pain from surgery site and wound vac thus she is needing A for LB ADLs at this time--she reports family can A her prn until she can do her LB ADLs on her own again. Pt has all needed DME at home. No further OT needs, we will sign off.     Recommendations for follow up therapy are one component of a multi-disciplinary discharge planning process, led by the attending physician.  Recommendations may be updated based on patient status, additional functional criteria and insurance authorization.   Assistance Recommended at Discharge PRN  Patient can return home with the following A little help with walking and/or transfers;A lot of help with bathing/dressing/bathroom;Assistance with cooking/housework;Assist for transportation    Functional Status Assessment  Patient has had a recent decline in their functional status and demonstrates the ability to make significant improvements in function in a reasonable and predictable amount of time. (family to A prn with ADLs until pt can do them for herself per pt report)  Equipment Recommendations  None recommended by OT       Precautions / Restrictions Precautions Precautions: Fall Precaution Comments: pt with wound  vac Restrictions Weight Bearing Restrictions: No Other Position/Activity Restrictions: pt reports she has a special shoe in her bag to wear on RLE when up and ambulating      Mobility Bed Mobility Overal bed mobility: Needs Assistance Bed Mobility: Supine to Sit     Supine to sit: Supervision, HOB elevated     General bed mobility comments: supervision for lines, pt elevating HOB and using bed rails to complete    Transfers Overall transfer level: Needs assistance   Transfers: Sit to/from Stand Sit to Stand: Min guard           General transfer comment: pt denies need for assist or DME, reaching for furniture in room to steady after standing. no instances of buckling in stance      Balance Overall balance assessment: Needs assistance Sitting-balance support: No upper extremity supported, Feet supported Sitting balance-Leahy Scale: Good     Standing balance support: Single extremity supported, During functional activity Standing balance-Leahy Scale: Fair                             ADL either performed or assessed with clinical judgement   ADL Overall ADL's : Needs assistance/impaired Eating/Feeding: Independent;Sitting   Grooming: Supervision/safety;Set up;Standing   Upper Body Bathing: Set up;Sitting   Lower Body Bathing: Minimal assistance Lower Body Bathing Details (indicate cue type and reason): min guard A sit<>stand Upper Body Dressing : Set up;Sitting   Lower Body Dressing: Moderate assistance Lower Body Dressing Details (indicate cue type and reason): min guard A sit<>stand (could not get socks on  nor can reach towards feet far enough to start underwear or pants Toilet Transfer: Min guard;Ambulation;Rolling walker (2 wheels) Toilet Transfer Details (indicate cue type and reason): simulated bed>out door, down hall, back to room>sit in recliner on other side of bed Toileting- Clothing Manipulation and Hygiene: Min guard;Sit to/from stand                Vision Patient Visual Report: No change from baseline              Pertinent Vitals/Pain Pain Assessment Pain Assessment: 0-10 Pain Score: 8  Pain Location: R lower abdomen Pain Descriptors / Indicators: Throbbing Pain Intervention(s): Limited activity within patient's tolerance, Monitored during session, Repositioned     Hand Dominance Right   Extremity/Trunk Assessment Upper Extremity Assessment Upper Extremity Assessment: Overall WFL for tasks assessed   Lower Extremity Assessment Lower Extremity Assessment: Generalized weakness;RLE deficits/detail;LLE deficits/detail RLE Deficits / Details: pt with dry gangrene of 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 grossly 4/5 to MMT. pt reports sensation better compared to before surgery, no differences between RLE and LLE LLE Deficits / Details: grossly 4/5 to MMT. pt reports sensation better compared to before surgery, no differences between RLE and LLE   Cervical / Trunk Assessment Cervical / Trunk Assessment: Kyphotic   Communication Communication Communication: No difficulties   Cognition Arousal/Alertness: Awake/alert Behavior During Therapy: WFL for tasks assessed/performed Overall Cognitive Status: Within Functional Limits for tasks assessed                                 General Comments: pt at times agrumentative, but demos good awareness and is able to follow all commands     General Comments  VSS on RA            Home Living Family/patient expects to be discharged to:: Private residence Living Arrangements: Spouse/significant other Available Help at Discharge: Available PRN/intermittently;Family Type of Home: Mobile home Home Access: Ramped entrance     Home Layout: One level (one step down into dinning room and laundryroom)     Bathroom Shower/Tub: Producer, television/film/video: Standard Bathroom Accessibility: Yes   Home Equipment: Agricultural consultant (2  wheels);Shower seat;BSC/3in1;Wheelchair - manual;Grab bars - tub/shower;Grab bars - toilet;Hand held shower head;Transport chair          Prior Functioning/Environment Prior Level of Function : Independent/Modified Independent             Mobility Comments: ambulates without AD, has a level of agoraphobia and does not go out often, is able to ambulate limited community distances although also reports use of scooter on occasion. ADLs Comments: no use of DME in the home, uses scooter for long distance due to pain in legs with community mobility        OT Problem List: Decreased range of motion;Impaired balance (sitting and/or standing);Pain         OT Goals(Current goals can be found in the care plan section) Acute Rehab OT Goals Patient Stated Goal: for pain to be better      Co-evaluation PT/OT/SLP Co-Evaluation/Treatment: Yes Reason for Co-Treatment:  (poor activity tolerance) PT goals addressed during session: Mobility/safety with mobility;Balance;Proper use of DME OT goals addressed during session: ADL's and self-care;Strengthening/ROM      AM-PAC OT "6 Clicks" Daily Activity     Outcome Measure Help from another person eating meals?: None Help from another person taking care of  personal grooming?: A Little Help from another person toileting, which includes using toliet, bedpan, or urinal?: A Little Help from another person bathing (including washing, rinsing, drying)?: A Lot Help from another person to put on and taking off regular upper body clothing?: A Little Help from another person to put on and taking off regular lower body clothing?: A Lot 6 Click Score: 17   End of Session Equipment Utilized During Treatment: Gait belt;Rolling walker (2 wheels)  Activity Tolerance: Patient tolerated treatment well Patient left: in chair;with call bell/phone within reach;with chair alarm set  OT Visit Diagnosis: Unsteadiness on feet (R26.81);Other abnormalities of gait and  mobility (R26.89);Pain Pain - Right/Left: Right Pain - part of body:  (lower abdomen)                Time: 1008-1030 OT Time Calculation (min): 22 min Charges:  OT General Charges $OT Visit: 1 Visit OT Evaluation $OT Eval Moderate Complexity: 1 Mod  Cathy L. OT Acute Rehabilitation Services Office [phone]    Vanessa [name] 11/30/2022, 1:26 PM

## 2022-11-30 NOTE — Progress Notes (Signed)
PHARMACIST LIPID MONITORING   Vanessa [name] is a 62 y.o. female admitted on 11/22/2022 with osteomyelitis and severe multilevel PAD.  Pharmacy has been consulted to optimize lipid-lowering therapy with the indication of secondary prevention for clinical ASCVD.  Recent Labs:  Lipid Panel (last 6 months):   Lab Results  Component Value Date   CHOL 184 11/25/2022   TRIG 149 11/25/2022   HDL 25 (L) 11/25/2022   CHOLHDL 7.4 11/25/2022   VLDL 30 11/25/2022   LDLCALC 129 (H) 11/25/2022    Hepatic function panel (last 6 months):   Lab Results  Component Value Date   AST 11 (L) 11/22/2022   ALT 10 11/22/2022   ALKPHOS 124 11/22/2022   BILITOT 0.6 11/22/2022   BILIDIR <0.1 11/22/2022   IBILI NOT CALCULATED 11/22/2022    SCr (since admission):   Serum creatinine: 0.39 mg/dL (L) 16/10/96 0454 Estimated creatinine clearance: 67.3 mL/min (A)  Current therapy and lipid therapy tolerance Current lipid-lowering therapy: atorvastatin 80mg  (increased from 40mg  on 5/9) Previous lipid-lowering therapies (if applicable): pravastatin Documented or reported allergies or intolerances to lipid-lowering therapies (if applicable): none  Assessment:   Lipid consult completed 11/26/22 with patient agreeing to adding ezetimibe.  Plan:    1.Statin intensity (high intensity recommended for all patients regardless of the LDL):  No statin changes. The patient is already on a high intensity statin.  2.Add ezetimibe (if any one of the following):   On a high intensity statin with LDL > 70.  3.Refer to lipid clinic:   No  4.Follow-up with:  Primary care provider - Evlyn Kanner, MD  5.Follow-up labs after discharge:  Changes in lipid therapy were made. Check a lipid panel in 8-12 weeks then annually.      Alphia Moh, PharmD, BCPS, BCCP Clinical Pharmacist  Please check AMION for all [facility] phone numbers After 10:00 PM, call Main Pharmacy [phone]

## 2022-11-30 NOTE — Progress Notes (Addendum)
  Progress Note    11/30/2022 7:45 AM 1 Day Post-Op  Subjective:  no major complaints   Vitals:   11/29/22 2330 11/30/22 0326  BP: 94/61 (!) 102/58  Pulse: 67 60  Resp: 16 14  Temp: 98.1 F (36.7 C) 98 F (36.7 C)  SpO2: 95% 100%   Physical Exam: Cardiac:  regular Lungs:  non labored Incisions:  right infraclavicular incisions, and right groin incision with Prevena vac with good seal Extremities:  RLE well perfused and warm with Doppler PT signal, faint DP. Dry gangrene of right 5th toe. RUE well perfused and warm with palpable radial pulse Abdomen:  obese, soft Neurologic: alert and oriented  CBC    Component Value Date/Time   WBC 12.4 (H) 11/30/2022 0630   RBC 3.70 (L) 11/30/2022 0630   HGB 11.0 (L) 11/30/2022 0630   HGB 17.0 (H) 09/21/2021 1543   HCT 34.4 (L) 11/30/2022 0630   HCT 49.4 (H) 09/21/2021 1543   PLT 395 11/30/2022 0630   PLT 464 (H) 09/21/2021 1543   MCV 93.0 11/30/2022 0630   MCV 88 09/21/2021 1543   MCH 29.7 11/30/2022 0630   MCHC 32.0 11/30/2022 0630   RDW 14.2 11/30/2022 0630   RDW 12.1 09/21/2021 1543   LYMPHSABS 1.6 11/30/2022 0630   LYMPHSABS 3.1 09/21/2021 1543   MONOABS 0.6 11/30/2022 0630   EOSABS 0.1 11/30/2022 0630   EOSABS 0.4 09/21/2021 1543   BASOSABS 0.1 11/30/2022 0630   BASOSABS 0.1 09/21/2021 1543    BMET    Component Value Date/Time   NA 131 (L) 11/30/2022 0630   NA 142 09/21/2021 1543   K 4.0 11/30/2022 0630   CL 95 (L) 11/30/2022 0630   CO2 28 11/30/2022 0630   GLUCOSE 295 (H) 11/30/2022 0630   BUN 12 11/30/2022 0630   BUN 8 09/21/2021 1543   CREATININE 0.42 (L) 11/30/2022 0630   CREATININE 0.56 11/04/2021 1019   CREATININE 0.45 (L) 12/03/2014 1542   CALCIUM 8.6 (L) 11/30/2022 0630   GFRNONAA >60 11/30/2022 0630   GFRNONAA >60 11/04/2021 1019   GFRNONAA >89 12/03/2014 1542   GFRAA 125 03/11/2020 1644   GFRAA >89 12/03/2014 1542    INR    Component Value Date/Time   INR 1.0 11/22/2022 1142      Intake/Output Summary (Last 24 hours) at 11/30/2022 0745 Last data filed at 11/30/2022 0439 Gross per 24 hour  Intake 2823.72 ml  Output 950 ml  Net 1873.72 ml     Assessment/Plan:  62 y.o. female is s/p   #1: Right axillary artery to common femoral artery bypass graft with 8 mm ringed PTFE #2: Placement of skin substitute, Kerecis first 28 cm) #3: Prevena wound VAC 1 Day Post-Op   Incisions intact and well appearing Prevena VAC with good seal RLE well perfused and warm with doppler PT signal CBC pending VSS Pain well controlled PT/OT to eval  Graceann Congress, PA-C [facility] [phone] 11/30/2022 7:45 AM  I agree with the above.  I have seen and evaluated the patient  [name]

## 2022-12-01 DIAGNOSIS — F4310 Post-traumatic stress disorder, unspecified: Secondary | ICD-10-CM

## 2022-12-01 DIAGNOSIS — L97519 Non-pressure chronic ulcer of other part of right foot with unspecified severity: Secondary | ICD-10-CM | POA: Diagnosis not present

## 2022-12-01 DIAGNOSIS — E1152 Type 2 diabetes mellitus with diabetic peripheral angiopathy with gangrene: Secondary | ICD-10-CM | POA: Diagnosis not present

## 2022-12-01 DIAGNOSIS — M86171 Other acute osteomyelitis, right ankle and foot: Secondary | ICD-10-CM | POA: Diagnosis not present

## 2022-12-01 DIAGNOSIS — E1169 Type 2 diabetes mellitus with other specified complication: Secondary | ICD-10-CM | POA: Diagnosis not present

## 2022-12-01 LAB — CBC WITH DIFFERENTIAL/PLATELET
Abs Immature Granulocytes: 0.06 10*3/uL (ref 0.00–0.07)
Basophils Absolute: 0.1 10*3/uL (ref 0.0–0.1)
Basophils Relative: 1 %
Eosinophils Absolute: 0.6 10*3/uL — ABNORMAL HIGH (ref 0.0–0.5)
Eosinophils Relative: 4 %
HCT: 35.2 % — ABNORMAL LOW (ref 36.0–46.0)
Hemoglobin: 11 g/dL — ABNORMAL LOW (ref 12.0–15.0)
Immature Granulocytes: 0 %
Lymphocytes Relative: 22 %
Lymphs Abs: 3 10*3/uL (ref 0.7–4.0)
MCH: 28.8 pg (ref 26.0–34.0)
MCHC: 31.3 g/dL (ref 30.0–36.0)
MCV: 92.1 fL (ref 80.0–100.0)
Monocytes Absolute: 1 10*3/uL (ref 0.1–1.0)
Monocytes Relative: 7 %
Neutro Abs: 8.9 10*3/uL — ABNORMAL HIGH (ref 1.7–7.7)
Neutrophils Relative %: 66 %
Platelets: 412 10*3/uL — ABNORMAL HIGH (ref 150–400)
RBC: 3.82 MIL/uL — ABNORMAL LOW (ref 3.87–5.11)
RDW: 14.2 % (ref 11.5–15.5)
WBC: 13.7 10*3/uL — ABNORMAL HIGH (ref 4.0–10.5)
nRBC: 0 % (ref 0.0–0.2)

## 2022-12-01 LAB — RENAL FUNCTION PANEL
Albumin: 2.5 g/dL — ABNORMAL LOW (ref 3.5–5.0)
Anion gap: 7 (ref 5–15)
BUN: 12 mg/dL (ref 8–23)
CO2: 28 mmol/L (ref 22–32)
Calcium: 8.4 mg/dL — ABNORMAL LOW (ref 8.9–10.3)
Chloride: 101 mmol/L (ref 98–111)
Creatinine, Ser: 0.53 mg/dL (ref 0.44–1.00)
GFR, Estimated: >60 mL/min (ref >60)
Glucose, Bld: 194 mg/dL — ABNORMAL HIGH (ref 70–99)
Phosphorus: 2.5 mg/dL (ref 2.5–4.6)
Potassium: 3.4 mmol/L — ABNORMAL LOW (ref 3.5–5.1)
Sodium: 136 mmol/L (ref 135–145)

## 2022-12-01 LAB — GLUCOSE, CAPILLARY
Glucose-Capillary: 196 mg/dL — ABNORMAL HIGH (ref 70–99)
Glucose-Capillary: 112 mg/dL — ABNORMAL HIGH (ref 70–99)
Glucose-Capillary: 174 mg/dL — ABNORMAL HIGH (ref 70–99)
Glucose-Capillary: 171 mg/dL — ABNORMAL HIGH (ref 70–99)

## 2022-12-01 LAB — MAGNESIUM: Magnesium: 1.9 mg/dL (ref 1.7–2.4)

## 2022-12-01 MED ORDER — INSULIN GLARGINE-YFGN 100 UNIT/ML ~~LOC~~ SOLN
5.0000 [IU] | Freq: Every day | SUBCUTANEOUS | Status: DC
  Administered 2022-12-01 – 2022-12-02 (×2): 5 [IU] via SUBCUTANEOUS
  Filled 2022-12-01 (×4): qty 0.05

## 2022-12-01 MED ORDER — POTASSIUM CHLORIDE CRYS ER 20 MEQ PO TBCR
40.0000 meq | EXTENDED_RELEASE_TABLET | Freq: Two times a day (BID) | ORAL | Status: AC
  Administered 2022-12-01 (×2): 40 meq via ORAL
  Filled 2022-12-01 (×2): qty 2

## 2022-12-01 NOTE — Inpatient Diabetes Management (Signed)
Inpatient Diabetes Program Recommendations  AACE/ADA: New Consensus Statement on Inpatient Glycemic Control (2015)  Target Ranges:  Prepandial:   less than 140 mg/dL      Peak postprandial:   less than 180 mg/dL (1-2 hours)      Critically ill patients:  140 - 180 mg/dL   Lab Results  Component Value Date   GLUCAP 196 (H) 12/01/2022   HGBA1C 9.8 (H) 11/22/2022    Review of Glycemic Control  Latest Reference Range & Units 11/30/22 06:16 11/30/22 11:46 11/30/22 16:15 11/30/22 21:10 12/01/22 06:28  Glucose-Capillary 70 - 99 mg/dL 409 (H) 99 811 (H) 914 (H) 196 (H)  (H): Data is abnormally high Diabetes history: DM2 Outpatient Diabetes medications: Soliqua 30 units QD Current orders for Inpatient glycemic control: Novolog 0-15 units TID and Semglee 10 units QHS Decadron 5 mg x 1 on 6/5  Inpatient Diabetes Program Recommendations:     Please consider decreasing correction to: Novolog 0-9 units TID.    Of note, patient's basal needs significantly reduced while inpatient. Would anticipate need for decrease at discharge.   Thanks, Lujean Rave, MSN, RNC-OB Diabetes Coordinator [phone] (8a-5p)

## 2022-12-01 NOTE — Progress Notes (Signed)
HD#9 Subjective:   Summary: Vanessa [name] is a 62 y.o. female with pertinent PMH of T2DM, COPD, HTN, and IBS who presents with progressively worsening ulcer on her right foot and is admitted for right foot osteomyelitis.  Doing well this morning without any new or worsening complaints.  She understands plan for right fifth toe amputation with possible fourth toe amputation this weekend.  She is ready to go through with this.  Objective:  Vital signs in last 24 hours: Vitals:   11/30/22 1729 11/30/22 1942 11/30/22 2331 12/01/22 0422  BP: (!) 92/52 (!) 123/53 (!) 136/51 (!) 143/46  Pulse:  84 82 81  Resp: 18 20 16 18   Temp:  98 F (36.7 C) 98.6 F (37 C) 98.7 F (37.1 C)  TempSrc:  Oral Oral Oral  SpO2:  99% 99% 99%  Weight:      Height:       Supplemental O2: room air   Physical Exam:  Constitutional: Elderly female. In no acute distress. Cardio:Regular rate and rhythm. Pulm: Normal work of breathing on room air MSK/skin: Roughly 2 cm area of ulceration on the plantar aspect of the right foot beneath the fourth and fifth toes that extends laterally around the fifth toe base.  Stable gangrenous tissue on the right fifth toe that does not extend past the MTP joint.  Neuro:Alert and oriented x3. No focal deficit noted.  Filed Weights   11/27/22 0601 11/28/22 0500 11/29/22 0500  Weight: 69.1 kg 69.1 kg 69.1 kg      Intake/Output Summary (Last 24 hours) at 12/01/2022 0634 Last data filed at 11/30/2022 1537 Gross per 24 hour  Intake 350 ml  Output --  Net 350 ml   Net IO Since Admission: -645.27 mL [12/01/22 0634]  Pertinent Labs:    Latest Ref Rng & Units 12/01/2022    2:06 AM 11/30/2022    6:30 AM 11/29/2022    4:23 PM  CBC  WBC 4.0 - 10.5 K/uL 13.7  12.4    Hemoglobin 12.0 - 15.0 g/dL 16.1  09.6  04.5   Hematocrit 36.0 - 46.0 % 35.2  34.4  37.0   Platelets 150 - 400 K/uL 412  395         Latest Ref Rng & Units 12/01/2022    2:06 AM 11/30/2022    6:30 AM 11/29/2022     4:23 PM  CMP  Glucose 70 - 99 mg/dL 409  811    BUN 8 - 23 mg/dL 12  12    Creatinine 9.14 - 1.00 mg/dL 7.82  9.56    Sodium 213 - 145 mmol/L 136  131  138   Potassium 3.5 - 5.1 mmol/L 3.4  4.0  4.3   Chloride 98 - 111 mmol/L 101  95    CO2 22 - 32 mmol/L 28  28    Calcium 8.9 - 10.3 mg/dL 8.4  8.6      Assessment/Plan:   Principal Problem:   Toe osteomyelitis, right (HCC) Active Problems:   Gangrene of toe of right foot (HCC)   Patient Summary: Vanessa [name] is a 62 y.o. female with pertinent PMH of T2DM, COPD, HTN, and IBS who presents with progressively worsening ulcer on her right foot and is admitted for right foot osteomyelitis and currently pending vascular or surgical intervention, on hospital day 9.  Osteomyelitis of the right foot fourth phalanx with gangrenous fifth toe Cellulitis of the right foot, resolved Nonhealing plantar ulcer  on the right foot Peripheral arterial disease Doing well postop axillary femoral bypass graft on 11/29/2022.  Gangrene on her fifth toe right foot is improved after surgery and stable.  Pending right fifth +/- fourth toe amputation with podiatry tomorrow or Sunday. - Will ensure she is n.p.o. tonight if on the surgery schedule - Ceftriaxone - Pain control with Tylenol 1000 mg every 8 hours and Dilaudid 1 mg IV every 2 hours as needed  HFmrEF TTE for cardiac clearance showed LVEF of 50 to 55%.  Had some dyspnea and new oxygen requirements after getting IV fluids but responded well to IV Lasix 40 mg x2.  Remains on on room air this morning with O2 sats of 98-100.   T2DM A1c 10/25/2022 was 10.6 and 9.8 here.  She is on Soliqua 30 units nightly at home which she was recently started on in our clinic.  She does want a Dexcom which we will address closer to discharge or on follow-up.  Had some low sugars and was n.p.o. for surgery yesterday.  With her being possibly n.p.o. tonight for surgery we will leave her 5 units of Semglee. - Semglee 5 units  nightly and moderate SSI -- Continue CBG monitoring, CM diet, possibly n.p.o. at midnight   COPD --incentive spirometer --Continue home inhaler equivalents with Breo Ellipta, Incruse Ellipta, and albuterol.  Will optimize with Trelegy on discharge.   Hypertension Had some episodes of hypotension after vascular surgery that is stabilized and we will continue to hold lisinopril.   Anxiety/depression Tobacco use disorder Will continue home medications of bupropion and citalopram.  Will need continued tobacco cessation counseling going forward. - Bupropion 150 mg twice daily, citalopram 40 mg daily, and nicotine patch daily as needed   HLD Repeat lipid panel showed LDL of 129.   --Continue home atorvastatin 80 mg daily and add ezetimibe 10 mg daily  Diet: Carb-Modified VTE: Enoxaparin Code: Full  Dispo: Anticipated discharge home versus short-term rehab depending on postop course after axillary to femoral bypass graft on 6/5 and planned podiatry intervention for her right foot wound.  Rocky Morel, DO 12/01/2022, 6:34 AM IM Resident, PGY-3 Pager: [phone] Internal Medicine Teaching Service Isaiah 41:10

## 2022-12-01 NOTE — Progress Notes (Signed)
Physical Therapy Treatment Patient Details Name: Vanessa [name] MRN: [id number] DOB: 10/03/1960 Today's Date: 12/01/2022   History of Present Illness The pt is a 62 yo female presenting 5/29 with R foot wound. 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 No endovascular revascularization options. Now s/p R axillary to common femoral artery bypass on 6/5. Per chart review, plan for possible R 4th or 5th toe amputation on 6/8. PMH includes: uncontrolled DM II, PAD, COPD, HTN, tobacco use, and IBS.    PT Comments    Pt received in supine, c/o frustration with not knowing what plan is for tomorrow re: possible surgery and requesting pain meds and to speak with MD about plan, RN notified. Pt agreeable to instruction on BLE exercises (handout given to reinforce, link below) and visual/verbal demo for all exercises. Pt defers EOB/OOB mobility due to pain, pt instructed on benefits of mobility. Reviewed importance of using RW with demo from PTA for all OOB mobility and to ask MD about WB status post-op if surgery tomorrow, pt agreeable. Pt continues to benefit from PT services to progress toward functional mobility goals.   Recommendations for follow up therapy are one component of a multi-disciplinary discharge planning process, led by the attending physician.  Recommendations may be updated based on patient status, additional functional criteria and insurance authorization.  Follow Up Recommendations       Assistance Recommended at Discharge Intermittent Supervision/Assistance  Patient can return home with the following A little help with walking and/or transfers;A little help with bathing/dressing/bathroom;Assistance with cooking/housework;Assist for transportation;Help with stairs or ramp for entrance   Equipment Recommendations  None recommended by PT (pending medical plan)     Recommendations for Other Services       Precautions / Restrictions Precautions Precautions: Fall Precaution Comments: pt with wound vac, groin incision, RLE gangrenous appearing 4-5th toes Restrictions Weight Bearing Restrictions: No Other Position/Activity Restrictions: RLE black post-op shoe in room     Mobility  Bed Mobility Overal bed mobility: Needs Assistance             General bed mobility comments: pt refusing, wants to wait till she has pain meds; has been pivoting OOB to [facility] per pt report.    Transfers                   General transfer comment: pt refusing    Ambulation/Gait               General Gait Details: Pt given visual/verbal demo for various WB techniques (WBAT vs PWB) using RW as per chart review plan for possible R toe amputation(s), no WB status in chart yet, but pt and family educated that MD will likely want her to use RW initially for all standing/gait tasks for improved wound healing and decreased LE pain.   Stairs             Wheelchair Mobility    Modified Rankin (Stroke Patients Only)       Balance Overall balance assessment: Needs assistance     Sitting balance - Comments: pt defers today                                    Cognition Arousal/Alertness: Awake/alert Behavior During Therapy: WFL for tasks assessed/performed Overall Cognitive Status: Within  Functional Limits for tasks assessed                                 General Comments: Pt reluctant to mobilize due to pain, RN notified of pt request for meds. Pt expressed frustration that MD (surgeon) has not come by yet today to let her know of the plan for tomorrow regarding the toes on her R foot.        Exercises Other Exercises Other Exercises: supine BLE AROM: ankle pumps, quad sets, hip abd (limited ROM/reps due to pain) x5-10 reps ea Other Exercises: Visual/verbal demo for other HEP exercises including chair  push-ups with emphasis on BUE activation, heel slides, LAQ, hip flexion (ROMAT due to pain) and frequency/reps    General Comments General comments (skin integrity, edema, etc.): VSS on RA; pt defers EOB/OOB mobility, reviewed importance of continued mobility even if at just bed level for prevention of blood clots/DVT post-op, pt/family receptive but pt continues to decline. HEP handout brought to room: Visit [email or web address]   Access Code: [email or web address]      Pertinent Vitals/Pain Pain Assessment Pain Assessment: 0-10 Pain Score: 8  Pain Location: R lower abdomen/groin and R foot/toes Pain Descriptors / Indicators: Throbbing, Grimacing, Guarding Pain Intervention(s): Limited activity within patient's tolerance, Monitored during session, Repositioned, Patient requesting pain meds-RN notified (pt defers ice pack for incisional pain)    Home Living                          Prior Function            PT Goals (current goals can now be found in the care plan section) Acute Rehab PT Goals Patient Stated Goal: return home PT Goal Formulation: With patient Time For Goal Achievement: 12/14/22 Progress towards PT goals:  (slow progress; pain)    Frequency    Min 1X/week      PT Plan Current plan remains appropriate    Co-evaluation              AM-PAC PT "6 Clicks" Mobility   Outcome Measure  Help needed turning from your back to your side while in a flat bed without using bedrails?: None (anticipated; pt defers to perform but states she has been doing this on her own) Help needed moving from lying on your back to sitting on the side of a flat bed without using bedrails?: None Help needed moving to and from a bed to a chair (including a wheelchair)?: A Little Help needed standing up from a chair using your arms (e.g., wheelchair or bedside chair)?: A Little Help needed to walk in hospital room?: A Little Help needed climbing 3-5 steps with a railing? :  Total 6 Click Score: 18    End of Session   Activity Tolerance: Patient limited by pain;Patient limited by fatigue Patient left: in bed;with call bell/phone within reach;with family/visitor present (spouse and daughter present in her room) Nurse Communication: Mobility status;Patient requests pain meds;Other (comment) (pt/family want to speak with surgeon about plan) PT Visit Diagnosis: Other abnormalities of gait and mobility (R26.89);Muscle weakness (generalized) (M62.81)     Time: 4782-9562 PT Time Calculation (min) (ACUTE ONLY): 13 min  Charges:  $Therapeutic Exercise: 8-22 mins                     Jonasia Coiner P., PTA Acute Rehabilitation Services Secure Chat Preferred  9a-5:30pm Office: [phone]    Dorathy Kinsman Shane Badeaux 12/01/2022, 6:41 PM

## 2022-12-01 NOTE — Progress Notes (Addendum)
  Progress Note    12/01/2022 7:43 AM 2 Days Post-Op  Subjective:  soreness along tunneled bypass on R flank   Vitals:   11/30/22 2331 12/01/22 0422  BP: (!) 136/51 (!) 143/46  Pulse: 82 81  Resp: 16 18  Temp: 98.6 F (37 C) 98.7 F (37.1 C)  SpO2: 99% 99%   Physical Exam: Lungs:  non labored Incisions:  R chest c/d/I; R groin with prevena with good seal; no hematoma along tunneled tract Extremities:  R foot warm with brisk PT signal; soft DP signal; 5th toe gangrene and malodorous Neurologic: A&O  CBC    Component Value Date/Time   WBC 13.7 (H) 12/01/2022 0206   RBC 3.82 (L) 12/01/2022 0206   HGB 11.0 (L) 12/01/2022 0206   HGB 17.0 (H) 09/21/2021 1543   HCT 35.2 (L) 12/01/2022 0206   HCT 49.4 (H) 09/21/2021 1543   PLT 412 (H) 12/01/2022 0206   PLT 464 (H) 09/21/2021 1543   MCV 92.1 12/01/2022 0206   MCV 88 09/21/2021 1543   MCH 28.8 12/01/2022 0206   MCHC 31.3 12/01/2022 0206   RDW 14.2 12/01/2022 0206   RDW 12.1 09/21/2021 1543   LYMPHSABS 3.0 12/01/2022 0206   LYMPHSABS 3.1 09/21/2021 1543   MONOABS 1.0 12/01/2022 0206   EOSABS 0.6 (H) 12/01/2022 0206   EOSABS 0.4 09/21/2021 1543   BASOSABS 0.1 12/01/2022 0206   BASOSABS 0.1 09/21/2021 1543    BMET    Component Value Date/Time   NA 136 12/01/2022 0206   NA 142 09/21/2021 1543   K 3.4 (L) 12/01/2022 0206   CL 101 12/01/2022 0206   CO2 28 12/01/2022 0206   GLUCOSE 194 (H) 12/01/2022 0206   BUN 12 12/01/2022 0206   BUN 8 09/21/2021 1543   CREATININE 0.53 12/01/2022 0206   CREATININE 0.56 11/04/2021 1019   CREATININE 0.45 (L) 12/03/2014 1542   CALCIUM 8.4 (L) 12/01/2022 0206   GFRNONAA >60 12/01/2022 0206   GFRNONAA >60 11/04/2021 1019   GFRNONAA >89 12/03/2014 1542   GFRAA 125 03/11/2020 1644   GFRAA >89 12/03/2014 1542    INR    Component Value Date/Time   INR 1.0 11/22/2022 1142     Intake/Output Summary (Last 24 hours) at 12/01/2022 0743 Last data filed at 11/30/2022 1537 Gross per 24  hour  Intake 350 ml  Output --  Net 350 ml     Assessment/Plan:  62 y.o. female is s/p R ax-fem bypass 2 Days Post-Op   R foot warm and well perfused;  PT better than DP by doppler Incisions are well appearing; maintain incisional vac in groin for about 1 week post op Plans noted for R fifth toe amp with Podiatry tomorrow   [name], PA-C [facility] [phone] 12/01/2022 7:43 AM  VASCULAR STAFF ADDENDUM: I have independently interviewed and examined the patient. I agree with the above.  Excellent signal, VAC to suction, right chest site with Etta Quill, MD [facility] Phone Number: [phone] 12/01/2022 8:56 AM

## 2022-12-02 ENCOUNTER — Inpatient Hospital Stay (HOSPITAL_COMMUNITY): Payer: 59 | Admitting: Certified Registered Nurse Anesthetist

## 2022-12-02 ENCOUNTER — Inpatient Hospital Stay (HOSPITAL_COMMUNITY): Payer: 59

## 2022-12-02 ENCOUNTER — Other Ambulatory Visit: Payer: Self-pay

## 2022-12-02 ENCOUNTER — Encounter (HOSPITAL_COMMUNITY): Payer: Self-pay | Admitting: Internal Medicine

## 2022-12-02 ENCOUNTER — Encounter (HOSPITAL_COMMUNITY)
Admission: EM | Disposition: A | Discharge status: Home / Self Care | Payer: Self-pay | Source: Home / Self Care | Attending: Internal Medicine

## 2022-12-02 DIAGNOSIS — I10 Essential (primary) hypertension: Secondary | ICD-10-CM

## 2022-12-02 DIAGNOSIS — E1169 Type 2 diabetes mellitus with other specified complication: Secondary | ICD-10-CM

## 2022-12-02 DIAGNOSIS — E1152 Type 2 diabetes mellitus with diabetic peripheral angiopathy with gangrene: Secondary | ICD-10-CM

## 2022-12-02 DIAGNOSIS — M869 Osteomyelitis, unspecified: Secondary | ICD-10-CM | POA: Insufficient documentation

## 2022-12-02 DIAGNOSIS — F17210 Nicotine dependence, cigarettes, uncomplicated: Secondary | ICD-10-CM

## 2022-12-02 DIAGNOSIS — L97519 Non-pressure chronic ulcer of other part of right foot with unspecified severity: Secondary | ICD-10-CM | POA: Diagnosis not present

## 2022-12-02 DIAGNOSIS — L03115 Cellulitis of right lower limb: Secondary | ICD-10-CM | POA: Diagnosis not present

## 2022-12-02 DIAGNOSIS — Z794 Long term (current) use of insulin: Secondary | ICD-10-CM

## 2022-12-02 DIAGNOSIS — J449 Chronic obstructive pulmonary disease, unspecified: Secondary | ICD-10-CM

## 2022-12-02 DIAGNOSIS — I5020 Unspecified systolic (congestive) heart failure: Secondary | ICD-10-CM

## 2022-12-02 DIAGNOSIS — M86171 Other acute osteomyelitis, right ankle and foot: Secondary | ICD-10-CM | POA: Diagnosis not present

## 2022-12-02 HISTORY — PX: AMPUTATION: SHX166

## 2022-12-02 LAB — CBC WITH DIFFERENTIAL/PLATELET
Abs Immature Granulocytes: 0.07 10*3/uL (ref 0.00–0.07)
Basophils Absolute: 0.1 10*3/uL (ref 0.0–0.1)
Basophils Relative: 1 %
Eosinophils Absolute: 0.6 10*3/uL — ABNORMAL HIGH (ref 0.0–0.5)
Eosinophils Relative: 5 %
HCT: 35.8 % — ABNORMAL LOW (ref 36.0–46.0)
Hemoglobin: 11.4 g/dL — ABNORMAL LOW (ref 12.0–15.0)
Immature Granulocytes: 1 %
Lymphocytes Relative: 26 %
Lymphs Abs: 3.3 10*3/uL (ref 0.7–4.0)
MCH: 29.8 pg (ref 26.0–34.0)
MCHC: 31.8 g/dL (ref 30.0–36.0)
MCV: 93.5 fL (ref 80.0–100.0)
Monocytes Absolute: 0.9 10*3/uL (ref 0.1–1.0)
Monocytes Relative: 7 %
Neutro Abs: 7.6 10*3/uL (ref 1.7–7.7)
Neutrophils Relative %: 60 %
Platelets: 409 10*3/uL — ABNORMAL HIGH (ref 150–400)
RBC: 3.83 MIL/uL — ABNORMAL LOW (ref 3.87–5.11)
RDW: 14.3 % (ref 11.5–15.5)
WBC: 12.6 10*3/uL — ABNORMAL HIGH (ref 4.0–10.5)
nRBC: 0 % (ref 0.0–0.2)

## 2022-12-02 LAB — GLUCOSE, CAPILLARY
Glucose-Capillary: 102 mg/dL — ABNORMAL HIGH (ref 70–99)
Glucose-Capillary: 108 mg/dL — ABNORMAL HIGH (ref 70–99)
Glucose-Capillary: 121 mg/dL — ABNORMAL HIGH (ref 70–99)
Glucose-Capillary: 136 mg/dL — ABNORMAL HIGH (ref 70–99)
Glucose-Capillary: 304 mg/dL — ABNORMAL HIGH (ref 70–99)
Glucose-Capillary: 96 mg/dL (ref 70–99)

## 2022-12-02 LAB — RENAL FUNCTION PANEL
Albumin: 2.5 g/dL — ABNORMAL LOW (ref 3.5–5.0)
Anion gap: 8 (ref 5–15)
BUN: 11 mg/dL (ref 8–23)
CO2: 30 mmol/L (ref 22–32)
Calcium: 8.5 mg/dL — ABNORMAL LOW (ref 8.9–10.3)
Chloride: 98 mmol/L (ref 98–111)
Creatinine, Ser: 0.41 mg/dL — ABNORMAL LOW (ref 0.44–1.00)
GFR, Estimated: >60 mL/min (ref >60)
Glucose, Bld: 113 mg/dL — ABNORMAL HIGH (ref 70–99)
Phosphorus: 2.5 mg/dL (ref 2.5–4.6)
Potassium: 4.3 mmol/L (ref 3.5–5.1)
Sodium: 136 mmol/L (ref 135–145)

## 2022-12-02 LAB — MAGNESIUM: Magnesium: 1.9 mg/dL (ref 1.7–2.4)

## 2022-12-02 LAB — AEROBIC/ANAEROBIC CULTURE W GRAM STAIN (SURGICAL/DEEP WOUND)

## 2022-12-02 SURGERY — AMPUTATION, FOOT, RAY
Anesthesia: General | Site: Foot | Laterality: Right

## 2022-12-02 MED ORDER — SODIUM CHLORIDE 0.9 % IR SOLN
Status: DC | PRN
  Administered 2022-12-02: 3000 mL

## 2022-12-02 MED ORDER — CHLORHEXIDINE GLUCONATE 0.12 % MT SOLN
OROMUCOSAL | Status: AC
  Administered 2022-12-02: 15 mL via OROMUCOSAL
  Filled 2022-12-02: qty 15

## 2022-12-02 MED ORDER — LACTATED RINGERS IV SOLN: INTRAVENOUS | Status: DC

## 2022-12-02 MED ORDER — FENTANYL CITRATE (PF) 250 MCG/5ML IJ SOLN
INTRAMUSCULAR | Status: AC
  Filled 2022-12-02: qty 5

## 2022-12-02 MED ORDER — ONDANSETRON HCL 4 MG/2ML IJ SOLN
INTRAMUSCULAR | Status: DC | PRN
  Administered 2022-12-02: 4 mg via INTRAVENOUS

## 2022-12-02 MED ORDER — ACETAMINOPHEN 10 MG/ML IV SOLN: 1000.0000 mg | Freq: Once | INTRAVENOUS | Status: DC | PRN

## 2022-12-02 MED ORDER — ORAL CARE MOUTH RINSE: 15.0000 mL | Freq: Once | OROMUCOSAL | Status: AC

## 2022-12-02 MED ORDER — PROPOFOL 10 MG/ML IV BOLUS
INTRAVENOUS | Status: DC | PRN
  Administered 2022-12-02: 130 mg via INTRAVENOUS

## 2022-12-02 MED ORDER — CHLORHEXIDINE GLUCONATE 0.12 % MT SOLN: 15.0000 mL | Freq: Once | OROMUCOSAL | Status: AC

## 2022-12-02 MED ORDER — DEXAMETHASONE SODIUM PHOSPHATE 10 MG/ML IJ SOLN
INTRAMUSCULAR | Status: AC
  Filled 2022-12-02: qty 1

## 2022-12-02 MED ORDER — ACETAMINOPHEN 160 MG/5ML PO SOLN: 325.0000 mg | Freq: Once | ORAL | Status: DC | PRN

## 2022-12-02 MED ORDER — MEPERIDINE HCL 25 MG/ML IJ SOLN: 6.2500 mg | INTRAMUSCULAR | Status: DC | PRN

## 2022-12-02 MED ORDER — PHENYLEPHRINE 80 MCG/ML (10ML) SYRINGE FOR IV PUSH (FOR BLOOD PRESSURE SUPPORT)
PREFILLED_SYRINGE | INTRAVENOUS | Status: DC | PRN
  Administered 2022-12-02 (×4): 80 ug via INTRAVENOUS

## 2022-12-02 MED ORDER — MIDAZOLAM HCL 2 MG/2ML IJ SOLN
INTRAMUSCULAR | Status: AC
  Filled 2022-12-02: qty 2

## 2022-12-02 MED ORDER — DEXAMETHASONE SODIUM PHOSPHATE 10 MG/ML IJ SOLN
INTRAMUSCULAR | Status: DC | PRN
  Administered 2022-12-02: 4 mg via INTRAVENOUS

## 2022-12-02 MED ORDER — PROMETHAZINE HCL 25 MG/ML IJ SOLN: 6.2500 mg | INTRAMUSCULAR | Status: DC | PRN

## 2022-12-02 MED ORDER — KETOROLAC TROMETHAMINE 30 MG/ML IJ SOLN
INTRAMUSCULAR | Status: DC | PRN
  Administered 2022-12-02: 30 mg via INTRAVENOUS

## 2022-12-02 MED ORDER — AMISULPRIDE (ANTIEMETIC) 5 MG/2ML IV SOLN: 10.0000 mg | Freq: Once | INTRAVENOUS | Status: DC | PRN

## 2022-12-02 MED ORDER — BUPIVACAINE HCL 0.5 % IJ SOLN
INTRAMUSCULAR | Status: DC | PRN
  Administered 2022-12-02 (×2): 10 mL

## 2022-12-02 MED ORDER — ALBUTEROL SULFATE HFA 108 (90 BASE) MCG/ACT IN AERS
INHALATION_SPRAY | RESPIRATORY_TRACT | Status: DC | PRN
  Administered 2022-12-02: 8 via RESPIRATORY_TRACT

## 2022-12-02 MED ORDER — PROPOFOL 10 MG/ML IV BOLUS
INTRAVENOUS | Status: AC
  Filled 2022-12-02: qty 20

## 2022-12-02 MED ORDER — ACETAMINOPHEN 325 MG PO TABS: 325.0000 mg | ORAL_TABLET | Freq: Once | ORAL | Status: DC | PRN

## 2022-12-02 MED ORDER — MIDAZOLAM HCL 2 MG/2ML IJ SOLN
INTRAMUSCULAR | Status: DC | PRN
  Administered 2022-12-02: 2 mg via INTRAVENOUS

## 2022-12-02 MED ORDER — HYDROMORPHONE HCL 1 MG/ML IJ SOLN: 0.2500 mg | INTRAMUSCULAR | Status: DC | PRN

## 2022-12-02 MED ORDER — ONDANSETRON HCL 4 MG/2ML IJ SOLN
INTRAMUSCULAR | Status: AC
  Filled 2022-12-02: qty 2

## 2022-12-02 MED ORDER — BUPIVACAINE HCL (PF) 0.5 % IJ SOLN
INTRAMUSCULAR | Status: AC
  Filled 2022-12-02: qty 30

## 2022-12-02 MED ORDER — INSULIN ASPART 100 UNIT/ML IJ SOLN: 0.0000 [IU] | INTRAMUSCULAR | Status: DC | PRN

## 2022-12-02 MED ORDER — ALBUTEROL SULFATE HFA 108 (90 BASE) MCG/ACT IN AERS
INHALATION_SPRAY | RESPIRATORY_TRACT | Status: AC
  Filled 2022-12-02: qty 6.7

## 2022-12-02 MED ORDER — 0.9 % SODIUM CHLORIDE (POUR BTL) OPTIME
TOPICAL | Status: DC | PRN
  Administered 2022-12-02: 1000 mL

## 2022-12-02 MED ORDER — LIDOCAINE 2% (20 MG/ML) 5 ML SYRINGE
INTRAMUSCULAR | Status: DC | PRN
  Administered 2022-12-02: 40 mg via INTRAVENOUS

## 2022-12-02 MED ORDER — PHENYLEPHRINE 80 MCG/ML (10ML) SYRINGE FOR IV PUSH (FOR BLOOD PRESSURE SUPPORT)
PREFILLED_SYRINGE | INTRAVENOUS | Status: AC
  Filled 2022-12-02: qty 10

## 2022-12-02 MED ORDER — LIDOCAINE 2% (20 MG/ML) 5 ML SYRINGE
INTRAMUSCULAR | Status: AC
  Filled 2022-12-02: qty 5

## 2022-12-02 SURGICAL SUPPLY — 39 items
ALLOGRAFT SALERA POWDER 40 (Graft) IMPLANT
BLADE AVERAGE 25X9 (BLADE) IMPLANT
BLADE LONG MED 31X9 (MISCELLANEOUS) IMPLANT
BNDG ELASTIC 4INX 5YD STR LF (GAUZE/BANDAGES/DRESSINGS) IMPLANT
BNDG GAUZE DERMACEA FLUFF 4 (GAUZE/BANDAGES/DRESSINGS) IMPLANT
COVER SURGICAL LIGHT HANDLE (MISCELLANEOUS) ×1 IMPLANT
CUFF TOURN SGL QUICK 24 (TOURNIQUET CUFF)
CUFF TRNQT CYL 24X4X16.5-23 (TOURNIQUET CUFF) IMPLANT
DRAPE SURG 17X23 STRL (DRAPES) ×1 IMPLANT
DRSG ADAPTIC 3X8 NADH LF (GAUZE/BANDAGES/DRESSINGS) IMPLANT
GAUZE SPONGE 2X2 8PLY STRL LF (GAUZE/BANDAGES/DRESSINGS) IMPLANT
GAUZE SPONGE 4X4 12PLY STRL (GAUZE/BANDAGES/DRESSINGS) IMPLANT
GAUZE XEROFORM 1X8 LF (GAUZE/BANDAGES/DRESSINGS) IMPLANT
GLOVE BIOGEL M 7.0 STRL (GLOVE) ×1 IMPLANT
GLOVE PI ORTHO PRO STRL 7.5 (GLOVE) ×1 IMPLANT
GOWN STRL REUS W/ TWL LRG LVL3 (GOWN DISPOSABLE) ×2 IMPLANT
GOWN STRL REUS W/TWL LRG LVL3 (GOWN DISPOSABLE) ×2
GRAFT SKIN WND SURGIBIND 3X7 (Tissue) IMPLANT
HANDPIECE INTERPULSE COAX TIP (DISPOSABLE) ×1
KIT BASIN OR (CUSTOM PROCEDURE TRAY) ×1 IMPLANT
KIT TURNOVER KIT B (KITS) ×1 IMPLANT
NDL HYPO 25GX1X1/2 BEV (NEEDLE) IMPLANT
NEEDLE HYPO 25GX1X1/2 BEV (NEEDLE) IMPLANT
NS IRRIG 1000ML POUR BTL (IV SOLUTION) ×1 IMPLANT
PACK ORTHO EXTREMITY (CUSTOM PROCEDURE TRAY) ×1 IMPLANT
PAD ARMBOARD 7.5X6 YLW CONV (MISCELLANEOUS) ×2 IMPLANT
SET HNDPC FAN SPRY TIP SCT (DISPOSABLE) IMPLANT
SOL PREP POV-IOD 4OZ 10% (MISCELLANEOUS) ×3 IMPLANT
SPECIMEN JAR SMALL (MISCELLANEOUS) ×1 IMPLANT
STAPLER VISISTAT 35W (STAPLE) IMPLANT
SUT ETHILON 3 0 PS 1 (SUTURE) ×1 IMPLANT
SUT PROLENE 3 0 PS 1 (SUTURE) IMPLANT
SYR CONTROL 10ML LL (SYRINGE) IMPLANT
TOWEL GREEN STERILE (TOWEL DISPOSABLE) ×1 IMPLANT
TOWEL GREEN STERILE FF (TOWEL DISPOSABLE) ×1 IMPLANT
TUBE CONNECTING 12X1/4 (SUCTIONS) IMPLANT
UNDERPAD 30X36 HEAVY ABSORB (UNDERPADS AND DIAPERS) ×1 IMPLANT
WATER STERILE IRR 1000ML POUR (IV SOLUTION) ×1 IMPLANT
YANKAUER SUCT BULB TIP NO VENT (SUCTIONS) IMPLANT

## 2022-12-02 NOTE — Progress Notes (Signed)
PT Cancellation Note  Patient Details Name: Vanessa [name] MRN: [id number] DOB: Mar 19, 1961   Cancelled Treatment:    Reason Eval/Treat Not Completed: Fatigue/lethargy limiting ability to participate  Patient reports she did not sleep last night and is not getting up right now. Reports she will get up later. RN informed.    Jerolyn Center, PT Acute Rehabilitation Services  Office [phone]   Zena Amos 12/02/2022, 1:49 PM

## 2022-12-02 NOTE — Anesthesia Postprocedure Evaluation (Signed)
Anesthesia Post Note  Patient: Vanessa [name]  Procedure(s) Performed: RAY AMPUTATION OF  RIGHT 5TH & 4TH TOES (Right: Foot)     Patient location during evaluation: PACU Anesthesia Type: General Level of consciousness: awake and alert Pain management: pain level controlled Vital Signs Assessment: post-procedure vital signs reviewed and stable Respiratory status: spontaneous breathing, nonlabored ventilation, respiratory function stable and patient connected to nasal cannula oxygen Cardiovascular status: blood pressure returned to baseline and stable Postop Assessment: no apparent nausea or vomiting Anesthetic complications: no  No notable events documented.  Last Vitals:  Vitals:   12/02/22 1150 12/02/22 1205  BP: 114/63 (!) 111/54  Pulse: 74 69  Resp: 12 16  Temp:  36.7 C  SpO2: 96% 94%    Last Pain:  Vitals:   12/02/22 1358  TempSrc:   PainSc: 7                  Shelton Silvas

## 2022-12-02 NOTE — Transfer of Care (Signed)
Immediate Anesthesia Transfer of Care Note  Patient: Vanessa [name]  Procedure(s) Performed: RAY AMPUTATION OF  RIGHT 5TH & 4TH TOES (Right: Foot)  Patient Location: PACU  Anesthesia Type:General  Level of Consciousness: drowsy  Airway & Oxygen Therapy: Patient Spontanous Breathing and Patient connected to face mask oxygen  Post-op Assessment: Report given to RN and Post -op Vital signs reviewed and stable  Post vital signs: Reviewed and stable  Last Vitals:  Vitals Value Taken Time  BP 121/60 12/02/22 1101  Temp    Pulse 77 12/02/22 1104  Resp 15 12/02/22 1104  SpO2 100 % 12/02/22 1104  Vitals shown include unvalidated device data.  Last Pain:  Vitals:   12/02/22 0510  TempSrc: Oral  PainSc:       Patients Stated Pain Goal: 0 (12/01/22 2229)  Complications: No notable events documented.

## 2022-12-02 NOTE — Op Note (Signed)
Full Operative Report  Date of Operation: 10:57 AM, 12/02/2022   Patient: Vanessa [name] - 62 y.o. female  Surgeon: Pilar Plate, DPM   Assistant: None  Diagnosis: Osteomyelitis and Gangrene 4th and 5th toe right foot  Procedure:  1. Partial 4th ray amputation, Right foot 2. Partial 5th ray amputation, Right foot    Anesthesia: General  Shelton Silvas, MD  Anesthesiologist: Shelton Silvas, MD CRNA: Garfield Cornea, CRNA   Estimated Blood Loss: Minimal   Hemostasis: 1) Anatomical dissection, mechanical compression, electrocautery 2) No tourniquet was used  Implants: Implant Name Type Inv. Item Serial No. Manufacturer Lot No. LRB No. Used Action  ALLOGRAFT SALERA POWDER 40 - [id number] Graft ALLOGRAFT SALERA POWDER 40 [id number] MUSCULOSKELETL TRANSPLANT FNDN  Right 1 Implanted  GRAFT SKIN WND SURGIBIND 3X7 - [id number] Tissue GRAFT SKIN WND SURGIBIND 3X7  [employer] [id number] Right 1 Implanted    Materials: prolene and skin staples  Injectables: 1) Pre-operatively: 20 cc of 0.5% marcaine plain 2) Post-operatively: None  Specimens: Toes for pathology, wound cultures   Antibiotics: IV abx given pre op on floor  Drains: None  Complications: Patient tolerated the procedure well without complication.   Findings: as below  Indications for Procedure: JESSLY LEBECK presents to Pilar Plate, DPM with a chief complaint of gangrene of the right foot 4th and 5th toe and concern for osteomyelitis of the 4th toe on MRI. The patient has failed conservative treatments of various modalities. At this time the patient has elected to proceed with surgical correction. All alternatives, risks, and complications of the procedures were thoroughly explained to the patient. Patient exhibits appropriate understanding of all discussion points and informed consent was signed and obtained in the chart with no guarantees to surgical outcome given or  implied.  Description of Procedure: Patient remained on hosp bed in supine position. Patient was secured to the table with safety belt, a contralateral SCD was placed, and all bony prominences were well padded. A surgical timeout was performed and all members of the operating room, the procedure, and the surgical site were identified. General LMA anesthesia occurred. Local anesthetic as previously described was then injected about the operative field in a local infiltrative block.   The right lower extremity was then prepped and draped in the usual sterile manner. The following procedure then began.  Attention was directed to the right lower extremity. A racquet type incision was made over the dorsal medial aspect of the 4th metatarsal, including the entire digit. A full thickness incision was made down to bone using a #15 blade. The incision was continued through the soft tissue down to the shaft of the 4th metatarsal. At this time,no purulent drainage was noted near the metatarsal head. A deep wound culture was taken at this time and passed off the field. Using a #15 blade, the digit was then disarticulated in its entirety at the metatarsophalangeal joint and freed of all soft tissue attachments. The specimen was passed off the field and sent for gross pathology. Next, a key elevator was then used to free up the periosteum on the metatarsal shaft. Using a sagittal saw, the metatarsal was cut in a dorsal distal to plantar proximal orientation and beveled  A bone culture was taken at this time. The bone was passed off the field and sent as a gross specimen.  All remaining non-viable and necrotic tissues were sharply resected and removed. Extensor and flexors tendons were grasped with a hemostat  and cut proximally.   Attention was directed to the right lower extremity. A racquet type incision was made over the dorsal medial aspect of the 5th metatarsal, including the entire digit. A full thickness incision  was made down to bone using a #15 blade. The incision was continued through the soft tissue down to the shaft of the 4th metatarsal. At this time,no purulent drainage was noted near the metatarsal head. A deep wound culture was taken at this time and passed off the field. Using a #15 blade, the digit was then disarticulated in its entirety at the metatarsophalangeal joint and freed of all soft tissue attachments. The specimen was passed off the field and sent for gross pathology. Next, a key elevator was then used to free up the periosteum on the metatarsal shaft. Using a sagittal saw, the metatarsal was cut in a dorsal distal to plantar proximal orientation and beveled  A bone culture was taken at this time. The bone was passed off the field and sent as a gross specimen.  All remaining non-viable and necrotic tissues were sharply resected and removed. Extensor and flexors tendons were grasped with a hemostat and cut proximally. The surgical site was then irrigated with saline 3L via pulse lavage. Next salera mini membrane powder graft was applied in the amputation site. The surgical site was then closed with prolene and staples.  Next, at site of plantar easchar which was debrided yesterday there was 2x2.5cm wound present to sub q fat. A piece of kerecis graft was then applied over the wound to promote healing and this was stapled in place over the superficial wound.   The surgical site was then dressed with betadine adaptic 4x4 kerlix ace.  The patient tolerated both the procedure and anesthesia well with vital signs stable throughout. The patient was transferred from the OR to recovery under the discretion of anesthesia.  Condition: Patient was taken to PACU in good condition and all vital signs stable and neurovascular status intact to the operative limb.  Dispo Pt will be returned to floor for PT, dsg change, abx.  TFAC will follow the patient throughout the entire post-operative course and the patient  is aware of all post-operative protocols in place. The patient will follow the protocol of rest, ice, and elevation. The patient will be WBAT in a post op shoe to the operative limb until further instructed. The dressing is to remain clean, dry, and intact.   Carlena Hurl, DPM

## 2022-12-02 NOTE — Anesthesia Procedure Notes (Signed)
Procedure Name: LMA Insertion Date/Time: 12/02/2022 10:08 AM  Performed by: Garfield Cornea, CRNAPre-anesthesia Checklist: Patient identified, Emergency Drugs available, Suction available and Patient being monitored Patient Re-evaluated:Patient Re-evaluated prior to induction Oxygen Delivery Method: Circle System Utilized Preoxygenation: Pre-oxygenation with 100% oxygen Induction Type: IV induction LMA: LMA inserted LMA Size: 4.0 Number of attempts: 1 Placement Confirmation: positive ETCO2 Tube secured with: Tape Dental Injury: Teeth and Oropharynx as per pre-operative assessment

## 2022-12-02 NOTE — Progress Notes (Addendum)
Orthopedic Tech Progress Note Patient Details:  [name] Mar 09, 1961 [id number]  Pt already has a PO shoe in her room.  Patient ID: Vanessa [name], female   DOB: May 28, 1961, 62 y.o.   MRN: [id number]  Docia Furl 12/02/2022, 2:21 PM

## 2022-12-02 NOTE — Anesthesia Preprocedure Evaluation (Addendum)
Anesthesia Evaluation  Patient identified by MRN, date of birth, ID band Patient awake    Reviewed: Allergy & Precautions, NPO status , Patient's Chart, lab work & pertinent test results  Airway Mallampati: I  TM Distance: >3 FB Neck ROM: Full    Dental  (+) Edentulous Upper, Edentulous Lower   Pulmonary asthma , COPD, Current Smoker and Patient abstained from smoking.   breath sounds clear to auscultation       Cardiovascular hypertension, Pt. on medications  Rhythm:Regular Rate:Normal  Echo: 1. Left ventricular ejection fraction, by estimation, is 50 to 55%. The  left ventricle has low normal function. The left ventricle has no regional  wall motion abnormalities. Left ventricular diastolic parameters were  normal.   2. Right ventricular systolic function is normal. The right ventricular  size is normal. Tricuspid regurgitation signal is inadequate for assessing  PA pressure.   3. Right atrial size was mildly dilated.   4. The mitral valve is normal in structure. No evidence of mitral valve  regurgitation. No evidence of mitral stenosis.   5. The aortic valve was not well visualized. Aortic valve regurgitation  is not visualized. No aortic stenosis is present.   6. The inferior vena cava is normal in size with greater than 50%  respiratory variability, suggesting right atrial pressure of 3 mmHg.      Neuro/Psych  PSYCHIATRIC DISORDERS Anxiety Depression    negative neurological ROS     GI/Hepatic Neg liver ROS,GERD  ,,  Endo/Other  diabetes, Type 2, Insulin Dependent    Renal/GU negative Renal ROS     Musculoskeletal   Abdominal   Peds  Hematology   Anesthesia Other Findings   Reproductive/Obstetrics                             Anesthesia Physical Anesthesia Plan  ASA: 3  Anesthesia Plan: General   Post-op Pain Management: Tylenol PO (pre-op)* and Toradol IV (intra-op)*    Induction: Intravenous  PONV Risk Score and Plan: 3 and Ondansetron, Dexamethasone and Midazolam  Airway Management Planned: LMA  Additional Equipment: None  Intra-op Plan:   Post-operative Plan: Extubation in OR  Informed Consent: I have reviewed the patients History and Physical, chart, labs and discussed the procedure including the risks, benefits and alternatives for the proposed anesthesia with the patient or authorized representative who has indicated his/her understanding and acceptance.     Dental advisory given  Plan Discussed with: CRNA  Anesthesia Plan Comments:        Anesthesia Quick Evaluation

## 2022-12-02 NOTE — Progress Notes (Signed)
   PODIATRY PROGRESS NOTE Patient Name: Vanessa [name]  DOB 1961/03/19 DOA 11/22/2022  Hospital Day: 42  Assessment:  63 y.o. female with severe PAD, T2DM, COPD, HTN, and IBS with gangrene and underlying osteomyelitis of the 5th toe and possibly 4th toe of the right foot.  AF, VSS  WBC: 12.4   Wound/Bone Cultures: pending OR  Imaging: MRI Foot R WO contrast 1. Abnormal marrow edema in the proximal phalanx of the fourth toe, compatible with osteomyelitis. There is also some localized subcutaneous edema below the fourth MTP joint and in the soft tissues of the proximal fourth toe, cellulitis is a distinct possibility.  Plan:  - NPO for OR today for amputation of 4th and 5th toe and possible partial ray resection 4/5 as needed for closure.  - S/p ax fem bypass per vascular, appreciate - Anticoag per primary ok to continue for procedure -IV Abx per primary, continue - Betadine pain daily to the digits on the  right foot - WBAT to Right foot post op in post op shoe Will continue to follow        Corinna Gab, DPM [facility]    Subjective:  Patient seen in pre op, aware of plans for OR today discussed amputation of 4th and 5th toe and partial bone behind the toe  Objective:   Vitals:   12/02/22 0034 12/02/22 0510  BP: (!) 125/58 (!) 151/55  Pulse: 72 77  Resp: 15 18  Temp: 98.3 F (36.8 C) 98.9 F (37.2 C)  SpO2: 97% 96%       Latest Ref Rng & Units 12/02/2022    2:15 AM 12/01/2022    2:06 AM 11/30/2022    6:30 AM  CBC  WBC 4.0 - 10.5 K/uL 12.6  13.7  12.4   Hemoglobin 12.0 - 15.0 g/dL 40.9  81.1  91.4   Hematocrit 36.0 - 46.0 % 35.8  35.2  34.4   Platelets 150 - 400 K/uL 409  412  395        Latest Ref Rng & Units 12/02/2022    2:15 AM 12/01/2022    2:06 AM 11/30/2022    6:30 AM  BMP  Glucose 70 - 99 mg/dL 782  956  213   BUN 8 - 23 mg/dL 11  12  12    Creatinine 0.44 - 1.00 mg/dL 0.86  5.78  4.69   Sodium 135 - 145 mmol/L 136  136  131    Potassium 3.5 - 5.1 mmol/L 4.3  3.4  4.0   Chloride 98 - 111 mmol/L 98  101  95   CO2 22 - 32 mmol/L 30  28  28    Calcium 8.9 - 10.3 mg/dL 8.5  8.4  8.6     General: AAOx3, NAD  Lower Extremity Exam General AA&O x3. Normal mood and affect.  Vascular Dorsalis pedis and posterior tibial pulses nonpalpable Brisk capillary sluggish to all digits.  No pedal hair present.  Neurologic Epicritic sensation grossly intact.  Dermatologic Right fifth digit gangrene , malodor present, erythema and edema to the 4th and 5th toe  Orthopedic: MMT 5/5 in dorsiflexion, plantarflexion, inversion, and eversion. Normal joint ROM without pain or crepitus.     Radiology:  Results reviewed. See assessment for pertinent imaging results

## 2022-12-02 NOTE — Progress Notes (Signed)
Patient arrived back to room from procedure. Vitals taken, denies pain, alert, call bell with in reach.

## 2022-12-02 NOTE — Progress Notes (Signed)
PT Cancellation Note  Patient Details Name: SYNIA DOUGLASS MRN: [id number] DOB: January 13, 1961   Cancelled Treatment:    Reason Eval/Treat Not Completed: Patient at procedure or test/unavailable  Patient off the unit for toe surgery. Will attempt to see this afternoon if appropriate.    Jerolyn Center, PT Acute Rehabilitation Services  Office [phone]  Zena Amos 12/02/2022, 9:52 AM

## 2022-12-02 NOTE — Progress Notes (Signed)
HD#10 Subjective:   Summary: Vanessa [name] is a 62 y.o. female with pertinent PMH of T2DM, COPD, HTN, and IBS who presents with progressively worsening ulcer on her right foot and is admitted for right foot osteomyelitis.  Doing well this morning and ready for her to amputation today.  She has no new or worsening complaints.  She is ready to get home to her own bed.  Objective:  Vital signs in last 24 hours: Vitals:   12/01/22 1619 12/01/22 2011 12/02/22 0034 12/02/22 0510  BP: (!) 144/56 (!) 158/46 (!) 125/58 (!) 151/55  Pulse: 71 73 72 77  Resp: 18 19 15 18   Temp: 98.6 F (37 C) 98.8 F (37.1 C) 98.3 F (36.8 C) 98.9 F (37.2 C)  TempSrc: Oral Oral Oral Oral  SpO2:  99% 97% 96%  Weight:      Height:       Supplemental O2: room air   Physical Exam:  Constitutional: Elderly female. In no acute distress. Cardio:Regular rate and rhythm. Pulm: Normal work of breathing on room air MSK/skin: Roughly 2 cm area of ulceration on the plantar aspect of the right foot beneath the fourth and fifth toes that extends laterally around the fifth toe base.  Stable gangrenous tissue on the right fifth toe that does not extend past the MTP joint.  Neuro:Alert and oriented x3. No focal deficit noted.  Filed Weights   11/27/22 0601 11/28/22 0500 11/29/22 0500  Weight: 69.1 kg 69.1 kg 69.1 kg      Intake/Output Summary (Last 24 hours) at 12/02/2022 0657 Last data filed at 12/01/2022 2000 Gross per 24 hour  Intake 480 ml  Output --  Net 480 ml   Net IO Since Admission: -165.27 mL [12/02/22 0657]  Pertinent Labs:    Latest Ref Rng & Units 12/02/2022    2:15 AM 12/01/2022    2:06 AM 11/30/2022    6:30 AM  CBC  WBC 4.0 - 10.5 K/uL 12.6  13.7  12.4   Hemoglobin 12.0 - 15.0 g/dL 66.4  40.3  47.4   Hematocrit 36.0 - 46.0 % 35.8  35.2  34.4   Platelets 150 - 400 K/uL 409  412  395        Latest Ref Rng & Units 12/02/2022    2:15 AM 12/01/2022    2:06 AM 11/30/2022    6:30 AM  CMP  Glucose  70 - 99 mg/dL 259  563  875   BUN 8 - 23 mg/dL 11  12  12    Creatinine 0.44 - 1.00 mg/dL 6.43  3.29  5.18   Sodium 135 - 145 mmol/L 136  136  131   Potassium 3.5 - 5.1 mmol/L 4.3  3.4  4.0   Chloride 98 - 111 mmol/L 98  101  95   CO2 22 - 32 mmol/L 30  28  28    Calcium 8.9 - 10.3 mg/dL 8.5  8.4  8.6     Assessment/Plan:   Principal Problem:   Toe osteomyelitis, right (HCC) Active Problems:   Gangrene of toe of right foot [facility])   Patient Summary: Vanessa [name] is a 62 y.o. female with pertinent PMH of T2DM, COPD, HTN, and IBS who presents with progressively worsening ulcer on her right foot and is admitted for right foot osteomyelitis and currently pending vascular or surgical intervention, on hospital day 10.  Osteomyelitis of the right foot fourth phalanx with gangrenous fifth toe Cellulitis of the  right foot, resolved Nonhealing plantar ulcer on the right foot Peripheral arterial disease Doing well postop axillary femoral bypass graft on 11/29/2022.  Gangrene on her fifth toe right foot is stable.  Going for right fifth +/- fourth toe amputation with podiatry today - Ceftriaxone, day 11 of antibiotics, will decide with podiatry on total course of antibiotics after surgery - Pain control with Tylenol 1000 mg every 8 hours and Dilaudid 1 mg IV every 2 hours as needed  HFmrEF TTE for cardiac clearance showed LVEF of 50 to 55%.  Had some dyspnea and new oxygen requirements after getting IV fluids but responded well to IV Lasix 40 mg x2.  Remains stable on room air for the last several days without continue diuresis.   T2DM A1c 10/25/2022 was 10.6 and 9.8 here.  She is on Soliqua 30 units nightly at home which she was recently started on in our clinic.  She does want a Dexcom which we will address closer to discharge or on follow-up.  Will keep an 5 units Semglee since she was n.p.o. last night again for surgery today. - Semglee 5 units nightly and moderate SSI -- Continue CBG monitoring,  CM diet    COPD --incentive spirometer --Continue home inhaler equivalents with Breo Ellipta, Incruse Ellipta, and albuterol.  Will optimize with Trelegy on discharge.   Hypertension Had some episodes of hypotension after vascular surgery that is stabilized and we will continue to hold lisinopril.   Anxiety/depression Tobacco use disorder Will continue home medications of bupropion and citalopram.  Will need continued tobacco cessation counseling going forward. - Bupropion 150 mg twice daily, citalopram 40 mg daily, and nicotine patch daily as needed   HLD Repeat lipid panel showed LDL of 129.   --Continue home atorvastatin 80 mg daily and add ezetimibe 10 mg daily  Diet: Carb-Modified VTE: Enoxaparin Code: Full  Dispo: Anticipated discharge home versus short-term rehab depending on postop course after axillary to femoral bypass graft on 6/5 and planned podiatry intervention for her right foot wound.  Rocky Morel, DO 12/02/2022, 6:57 AM IM Resident, PGY-3 Pager: [phone] Internal Medicine Teaching Service Isaiah 41:10

## 2022-12-02 NOTE — Progress Notes (Addendum)
  Progress Note    12/02/2022 9:04 AM Day of Surgery  Subjective:  feeling a little sore. Ready for surgery today    Vitals:   12/02/22 0034 12/02/22 0510  BP: (!) 125/58 (!) 151/55  Pulse: 72 77  Resp: 15 18  Temp: 98.3 F (36.8 C) 98.9 F (37.2 C)  SpO2: 97% 96%    Physical Exam: General:  resting comfortably Cardiac:  regular Lungs:  nonlabored Incisions:  R sided chest incision c/d/l. Right groin with prevena with good seal Extremities: RLE warm with brisk PT signal and soft DP signal. 5th toe gangrene   CBC    Component Value Date/Time   WBC 12.6 (H) 12/02/2022 0215   RBC 3.83 (L) 12/02/2022 0215   HGB 11.4 (L) 12/02/2022 0215   HGB 17.0 (H) 09/21/2021 1543   HCT 35.8 (L) 12/02/2022 0215   HCT 49.4 (H) 09/21/2021 1543   PLT 409 (H) 12/02/2022 0215   PLT 464 (H) 09/21/2021 1543   MCV 93.5 12/02/2022 0215   MCV 88 09/21/2021 1543   MCH 29.8 12/02/2022 0215   MCHC 31.8 12/02/2022 0215   RDW 14.3 12/02/2022 0215   RDW 12.1 09/21/2021 1543   LYMPHSABS 3.3 12/02/2022 0215   LYMPHSABS 3.1 09/21/2021 1543   MONOABS 0.9 12/02/2022 0215   EOSABS 0.6 (H) 12/02/2022 0215   EOSABS 0.4 09/21/2021 1543   BASOSABS 0.1 12/02/2022 0215   BASOSABS 0.1 09/21/2021 1543    BMET    Component Value Date/Time   NA 136 12/02/2022 0215   NA 142 09/21/2021 1543   K 4.3 12/02/2022 0215   CL 98 12/02/2022 0215   CO2 30 12/02/2022 0215   GLUCOSE 113 (H) 12/02/2022 0215   BUN 11 12/02/2022 0215   BUN 8 09/21/2021 1543   CREATININE 0.41 (L) 12/02/2022 0215   CREATININE 0.56 11/04/2021 1019   CREATININE 0.45 (L) 12/03/2014 1542   CALCIUM 8.5 (L) 12/02/2022 0215   GFRNONAA >60 12/02/2022 0215   GFRNONAA >60 11/04/2021 1019   GFRNONAA >89 12/03/2014 1542   GFRAA 125 03/11/2020 1644   GFRAA >89 12/03/2014 1542    INR    Component Value Date/Time   INR 1.0 11/22/2022 1142     Intake/Output Summary (Last 24 hours) at 12/02/2022 0904 Last data filed at 12/01/2022  2000 Gross per 24 hour  Intake 240 ml  Output --  Net 240 ml      Assessment/Plan:  62 y.o. female is 3 days post op, s/p: right ax-fem bypass   -RLE warm and well perfused. Brisk PT doppler signal and soft DP signal -R sided chest incision intact and dry. No hematoma along tunneled bypass. R groin with prevena with good seal. This will stay on for another 4-5 days -Plans for right 5th toe amp today with podiatry   Loel Dubonnet, PA-C [facility] [phone] 12/02/2022 9:04 AM   VASCULAR STAFF ADDENDUM: Will continue to follow. Toe amp today.   Fara Olden, MD [facility] Phone Number: [phone] 12/02/2022 9:14 AM

## 2022-12-03 ENCOUNTER — Encounter (HOSPITAL_COMMUNITY): Payer: Self-pay | Admitting: Podiatry

## 2022-12-03 DIAGNOSIS — E1151 Type 2 diabetes mellitus with diabetic peripheral angiopathy without gangrene: Secondary | ICD-10-CM | POA: Diagnosis not present

## 2022-12-03 DIAGNOSIS — L97519 Non-pressure chronic ulcer of other part of right foot with unspecified severity: Secondary | ICD-10-CM | POA: Diagnosis not present

## 2022-12-03 DIAGNOSIS — E1169 Type 2 diabetes mellitus with other specified complication: Secondary | ICD-10-CM | POA: Diagnosis not present

## 2022-12-03 DIAGNOSIS — F418 Other specified anxiety disorders: Secondary | ICD-10-CM

## 2022-12-03 DIAGNOSIS — M86171 Other acute osteomyelitis, right ankle and foot: Secondary | ICD-10-CM | POA: Diagnosis not present

## 2022-12-03 LAB — CBC WITH DIFFERENTIAL/PLATELET
Abs Immature Granulocytes: 0.09 10*3/uL — ABNORMAL HIGH (ref 0.00–0.07)
Basophils Absolute: 0.1 10*3/uL (ref 0.0–0.1)
Basophils Relative: 0 %
Eosinophils Absolute: 0.1 10*3/uL (ref 0.0–0.5)
Eosinophils Relative: 0 %
HCT: 36.6 % (ref 36.0–46.0)
Hemoglobin: 11.6 g/dL — ABNORMAL LOW (ref 12.0–15.0)
Immature Granulocytes: 1 %
Lymphocytes Relative: 11 %
Lymphs Abs: 1.8 10*3/uL (ref 0.7–4.0)
MCH: 29.2 pg (ref 26.0–34.0)
MCHC: 31.7 g/dL (ref 30.0–36.0)
MCV: 92.2 fL (ref 80.0–100.0)
Monocytes Absolute: 0.9 10*3/uL (ref 0.1–1.0)
Monocytes Relative: 5 %
Neutro Abs: 13.7 10*3/uL — ABNORMAL HIGH (ref 1.7–7.7)
Neutrophils Relative %: 83 %
Platelets: 390 10*3/uL (ref 150–400)
RBC: 3.97 MIL/uL (ref 3.87–5.11)
RDW: 13.8 % (ref 11.5–15.5)
WBC: 16.6 10*3/uL — ABNORMAL HIGH (ref 4.0–10.5)
nRBC: 0 % (ref 0.0–0.2)

## 2022-12-03 LAB — RENAL FUNCTION PANEL
Albumin: 2.6 g/dL — ABNORMAL LOW (ref 3.5–5.0)
Anion gap: 12 (ref 5–15)
BUN: 9 mg/dL (ref 8–23)
CO2: 26 mmol/L (ref 22–32)
Calcium: 9 mg/dL (ref 8.9–10.3)
Chloride: 94 mmol/L — ABNORMAL LOW (ref 98–111)
Creatinine, Ser: 0.49 mg/dL (ref 0.44–1.00)
GFR, Estimated: >60 mL/min (ref >60)
Glucose, Bld: 260 mg/dL — ABNORMAL HIGH (ref 70–99)
Phosphorus: 3.1 mg/dL (ref 2.5–4.6)
Potassium: 4.5 mmol/L (ref 3.5–5.1)
Sodium: 132 mmol/L — ABNORMAL LOW (ref 135–145)

## 2022-12-03 LAB — GLUCOSE, CAPILLARY
Glucose-Capillary: 127 mg/dL — ABNORMAL HIGH (ref 70–99)
Glucose-Capillary: 76 mg/dL (ref 70–99)
Glucose-Capillary: 275 mg/dL — ABNORMAL HIGH (ref 70–99)
Glucose-Capillary: 178 mg/dL — ABNORMAL HIGH (ref 70–99)
Glucose-Capillary: 195 mg/dL — ABNORMAL HIGH (ref 70–99)
Glucose-Capillary: 225 mg/dL — ABNORMAL HIGH (ref 70–99)

## 2022-12-03 LAB — AEROBIC/ANAEROBIC CULTURE W GRAM STAIN (SURGICAL/DEEP WOUND)

## 2022-12-03 MED ORDER — FUROSEMIDE 10 MG/ML IJ SOLN
20.0000 mg | Freq: Once | INTRAMUSCULAR | Status: AC
  Administered 2022-12-03: 20 mg via INTRAVENOUS
  Filled 2022-12-03: qty 2

## 2022-12-03 MED ORDER — INSULIN GLARGINE-YFGN 100 UNIT/ML ~~LOC~~ SOLN
7.0000 [IU] | Freq: Every day | SUBCUTANEOUS | Status: DC
  Administered 2022-12-03 – 2022-12-04 (×2): 7 [IU] via SUBCUTANEOUS
  Filled 2022-12-03 (×3): qty 0.07

## 2022-12-03 NOTE — Progress Notes (Signed)
PODIATRY PROGRESS NOTE Patient Name: Vanessa [name]  DOB 26-Sep-1960 DOA 11/22/2022  Hospital Day: 67  Assessment:  62 y.o. female with severe PAD, T2DM, COPD, HTN, and IBS with gangrene and underlying osteomyelitis of the 5th toe and possibly 4th toe of the right foot.  POD #1 s/p R partial 4th and 5th ray amputation. Closed amp site, kerecis graft applied for superficial wound, healing well.   AF, VSS  WBC: 16.6  Wound/Bone Cultures: pending   Imaging: MRI Foot R WO contrast 1. Abnormal marrow edema in the proximal phalanx of the fourth toe, compatible with osteomyelitis. There is also some localized subcutaneous edema below the fourth MTP joint and in the soft tissues of the proximal fourth toe, cellulitis is a distinct possibility.  Plan:  - s/p R 4/5th partial ray amputation healing well, no issues at amp site. Good healing potential seen with adequate bleeding noted intra op - S/p ax fem bypass per vascular, appreciate - Anticoag per primary ok to continue - IV Abx per primary, continue, follow op cultures - xerofrom dressing applied to R foot - remain intact until Wednesday and then to be changed prior to DC - WBAT to Right foot post op in post op shoe Will continue to follow        Corinna Gab, DPM [facility]    Subjective:  Patient seen bedside post op doing well, burning pain at amp site. Walking on heel and with post op shoe. No concerns or questions.   Objective:   Vitals:   12/03/22 0100 12/03/22 0802  BP: (!) 157/56 (!) 153/53  Pulse: 61 62  Resp: 13 17  Temp: 98.7 F (37.1 C) 98 F (36.7 C)  SpO2: 96% 92%       Latest Ref Rng & Units 12/03/2022    1:49 AM 12/02/2022    2:15 AM 12/01/2022    2:06 AM  CBC  WBC 4.0 - 10.5 K/uL 16.6  12.6  13.7   Hemoglobin 12.0 - 15.0 g/dL 81.1  91.4  78.2   Hematocrit 36.0 - 46.0 % 36.6  35.8  35.2   Platelets 150 - 400 K/uL 390  409  412        Latest Ref Rng & Units 12/03/2022    1:49 AM  12/02/2022    2:15 AM 12/01/2022    2:06 AM  BMP  Glucose 70 - 99 mg/dL 956  213  086   BUN 8 - 23 mg/dL 9  11  12    Creatinine 0.44 - 1.00 mg/dL 5.78  4.69  6.29   Sodium 135 - 145 mmol/L 132  136  136   Potassium 3.5 - 5.1 mmol/L 4.5  4.3  3.4   Chloride 98 - 111 mmol/L 94  98  101   CO2 22 - 32 mmol/L 26  30  28    Calcium 8.9 - 10.3 mg/dL 9.0  8.5  8.4     General: AAOx3, NAD  Lower Extremity Exam General AA&O x3. Normal mood and affect.  Vascular Dorsalis pedis and posterior tibial pulses nonpalpable Brisk capillary sluggish to all digits.  No pedal hair present.  Neurologic Epicritic sensation grossly intact.  Dermatologic Amputation and kerecis graft intact and healing well, dry no erythema and minimal edema   Orthopedic: MMT 5/5 in dorsiflexion, plantarflexion, inversion, and eversion. Normal joint ROM without pain or crepitus.     Radiology:  Results reviewed. See assessment for pertinent imaging  results

## 2022-12-03 NOTE — Progress Notes (Addendum)
  Progress Note    12/03/2022 7:54 AM 1 Day Post-Op  Subjective:  some stinging at amputation site but pain is controlled    Vitals:   12/02/22 2050 12/03/22 0100  BP: (!) 150/46 (!) 157/56  Pulse: 67 61  Resp: 16 13  Temp: 98.8 F (37.1 C) 98.7 F (37.1 C)  SpO2: 96% 96%    Physical Exam: General:  no acute distress Cardiac:  regular Lungs:  nonlabored Incisions:  r sided chest incision intact and dry. Right sided groin incision intact with prevena Extremities:  R foot wrapped and dry. Brisk right PT doppler signal   CBC    Component Value Date/Time   WBC 16.6 (H) 12/03/2022 0149   RBC 3.97 12/03/2022 0149   HGB 11.6 (L) 12/03/2022 0149   HGB 17.0 (H) 09/21/2021 1543   HCT 36.6 12/03/2022 0149   HCT 49.4 (H) 09/21/2021 1543   PLT 390 12/03/2022 0149   PLT 464 (H) 09/21/2021 1543   MCV 92.2 12/03/2022 0149   MCV 88 09/21/2021 1543   MCH 29.2 12/03/2022 0149   MCHC 31.7 12/03/2022 0149   RDW 13.8 12/03/2022 0149   RDW 12.1 09/21/2021 1543   LYMPHSABS 1.8 12/03/2022 0149   LYMPHSABS 3.1 09/21/2021 1543   MONOABS 0.9 12/03/2022 0149   EOSABS 0.1 12/03/2022 0149   EOSABS 0.4 09/21/2021 1543   BASOSABS 0.1 12/03/2022 0149   BASOSABS 0.1 09/21/2021 1543    BMET    Component Value Date/Time   NA 132 (L) 12/03/2022 0149   NA 142 09/21/2021 1543   K 4.5 12/03/2022 0149   CL 94 (L) 12/03/2022 0149   CO2 26 12/03/2022 0149   GLUCOSE 260 (H) 12/03/2022 0149   BUN 9 12/03/2022 0149   BUN 8 09/21/2021 1543   CREATININE 0.49 12/03/2022 0149   CREATININE 0.56 11/04/2021 1019   CREATININE 0.45 (L) 12/03/2014 1542   CALCIUM 9.0 12/03/2022 0149   GFRNONAA >60 12/03/2022 0149   GFRNONAA >60 11/04/2021 1019   GFRNONAA >89 12/03/2014 1542   GFRAA 125 03/11/2020 1644   GFRAA >89 12/03/2014 1542    INR    Component Value Date/Time   INR 1.0 11/22/2022 1142     Intake/Output Summary (Last 24 hours) at 12/03/2022 0754 Last data filed at 12/02/2022 2000 Gross  per 24 hour  Intake 980 ml  Output 10 ml  Net 970 ml      Assessment/Plan:  62 y.o. female is s/p:   4 days post op: right ax-fem bypass 1 day post op: right 4th and 5th toe amputation  -No issues overnight. Had right 4th and 5th toe amp by podiatry yesterday. Pain is well controlled. Foot care per podiatry -R sided chest incision intact and dry. R groin incision with prevena. Potentially this can come off Wednesday -RLE well perfused with brisk PT doppler signal   Loel Dubonnet, PA-C [facility] [phone] 12/03/2022 7:54 AM  VASCULAR STAFF ADDENDUM: I have independently interviewed and examined the patient. I agree with the above.  VAC to suction. Pain controlled. Good signal   Fara Olden, MD [facility] Phone Number: [phone] 12/03/2022 4:33 PM

## 2022-12-03 NOTE — Progress Notes (Signed)
     Subjective: Vanessa [name] is POD 1 for her amputation.  She notes that the pain is  7/10 currently, but well controlled with current medication.  She has not yet had a bowel movement.  She has good seal at her wound vac and no pain at the area.   Objective:  Vital signs in last 24 hours: Vitals:   12/02/22 1541 12/02/22 2050 12/03/22 0100 12/03/22 0517  BP: (!) 141/46 (!) 150/46 (!) 157/56   Pulse: 76 67 61   Resp: 17 16 13    Temp: 98.7 F (37.1 C) 98.8 F (37.1 C) 98.7 F (37.1 C)   TempSrc: Oral Oral Oral   SpO2: 92% 96% 96%   Weight:    77.3 kg  Height:       Gen: Lying in bed, no distress Eyes: She has chronic periorbital plaques - ? Cholesterol.  She has EOMI, anicteric sclerae HENT: Neck is supple CV: RR, NR, no murmur noted Pulm: Very mild crackles at bases, no wheezing Abd: Soft, NT Groin: She has a wound vac in place in right anterior groin.  Clean and dry, good seal MSK: right LE is wrapped.  Leg is warm.  She has good movement of toes.  Intact sensation at the toes Psych: Pleasant, no distress.   Assessment/Plan:  Osteomyelitis of the right foot fourth phalanx with gangrenous fifth toe Cellulitis of the right foot, resolved Nonhealing plantar ulcer on the right foot Peripheral arterial disease - POD #1 from right 4th and 5th toe amputation - POD #4 from right ax-fem bypass - Prevana overlying groin incision, potentially off on Wednesday - Xeroform dressing over right foot surgical site, potentially off Wednesday - WBAT in surgical shoe - Pain control with tylenol, dilaudid and oxycodone - Follow podiatry and vascular surgery recommendations - Antibiotics until wound cultures are confirmed, currently "too young to read" - WBC up to 16, likely reactive in the setting of surgery - Recheck labs in the AM  HFmrEF  - Some crackles - IV lasix 20mg  X 1 today - Monitor volume status  DM2 - Semglee 5 units - Received 11 units of SSI yesterday - Increase  Semglee to 7 units - Continue SSI  H/O COPD - Continue inhalers - Wean oxygen as tolerated - IV lasix as noted above  HTN - Holding home lisinopril in the post operative stage - Reassess daily, most recent blood pressures are normotensive  Anxiety/depression - continue bupropion, citalopram  HLD - Continue atorvastatin, zetia  Dispo: Anticipated discharge in approximately 3-4 day(s).   Vanessa Catalina, MD 12/03/2022, 7:32 AM

## 2022-12-04 ENCOUNTER — Encounter (HOSPITAL_COMMUNITY): Payer: Self-pay | Admitting: Podiatry

## 2022-12-04 DIAGNOSIS — L97519 Non-pressure chronic ulcer of other part of right foot with unspecified severity: Secondary | ICD-10-CM | POA: Diagnosis not present

## 2022-12-04 DIAGNOSIS — E1151 Type 2 diabetes mellitus with diabetic peripheral angiopathy without gangrene: Secondary | ICD-10-CM | POA: Diagnosis not present

## 2022-12-04 DIAGNOSIS — M86171 Other acute osteomyelitis, right ankle and foot: Secondary | ICD-10-CM | POA: Diagnosis not present

## 2022-12-04 DIAGNOSIS — E1169 Type 2 diabetes mellitus with other specified complication: Secondary | ICD-10-CM | POA: Diagnosis not present

## 2022-12-04 LAB — BASIC METABOLIC PANEL
Anion gap: 9 (ref 5–15)
BUN: 8 mg/dL (ref 8–23)
CO2: 29 mmol/L (ref 22–32)
Calcium: 8.4 mg/dL — ABNORMAL LOW (ref 8.9–10.3)
Chloride: 94 mmol/L — ABNORMAL LOW (ref 98–111)
Creatinine, Ser: 0.58 mg/dL (ref 0.44–1.00)
GFR, Estimated: >60 mL/min (ref >60)
Glucose, Bld: 268 mg/dL — ABNORMAL HIGH (ref 70–99)
Potassium: 4.1 mmol/L (ref 3.5–5.1)
Sodium: 132 mmol/L — ABNORMAL LOW (ref 135–145)

## 2022-12-04 LAB — CBC
HCT: 35.7 % — ABNORMAL LOW (ref 36.0–46.0)
Hemoglobin: 11 g/dL — ABNORMAL LOW (ref 12.0–15.0)
MCH: 29.3 pg (ref 26.0–34.0)
MCHC: 30.8 g/dL (ref 30.0–36.0)
MCV: 95.2 fL (ref 80.0–100.0)
Platelets: 459 10*3/uL — ABNORMAL HIGH (ref 150–400)
RBC: 3.75 MIL/uL — ABNORMAL LOW (ref 3.87–5.11)
RDW: 13.9 % (ref 11.5–15.5)
WBC: 14.7 10*3/uL — ABNORMAL HIGH (ref 4.0–10.5)
nRBC: 0 % (ref 0.0–0.2)

## 2022-12-04 LAB — AEROBIC/ANAEROBIC CULTURE W GRAM STAIN (SURGICAL/DEEP WOUND)

## 2022-12-04 LAB — GLUCOSE, CAPILLARY
Glucose-Capillary: 192 mg/dL — ABNORMAL HIGH (ref 70–99)
Glucose-Capillary: 64 mg/dL — ABNORMAL LOW (ref 70–99)
Glucose-Capillary: 64 mg/dL — ABNORMAL LOW (ref 70–99)
Glucose-Capillary: 70 mg/dL (ref 70–99)
Glucose-Capillary: 198 mg/dL — ABNORMAL HIGH (ref 70–99)
Glucose-Capillary: 222 mg/dL — ABNORMAL HIGH (ref 70–99)

## 2022-12-04 MED ORDER — SODIUM CHLORIDE 0.9 % IV SOLN
3.0000 g | Freq: Four times a day (QID) | INTRAVENOUS | Status: DC
  Administered 2022-12-04 – 2022-12-05 (×4): 3 g via INTRAVENOUS
  Filled 2022-12-04 (×4): qty 8

## 2022-12-04 MED ORDER — LISINOPRIL 5 MG PO TABS
5.0000 mg | ORAL_TABLET | Freq: Every day | ORAL | Status: DC
  Administered 2022-12-04 – 2022-12-05 (×2): 5 mg via ORAL
  Filled 2022-12-04 (×2): qty 1

## 2022-12-04 MED ORDER — INSULIN ASPART 100 UNIT/ML IJ SOLN
0.0000 [IU] | Freq: Three times a day (TID) | INTRAMUSCULAR | Status: DC
  Administered 2022-12-04: 2 [IU] via SUBCUTANEOUS
  Administered 2022-12-05: 7 [IU] via SUBCUTANEOUS
  Administered 2022-12-05 (×2): 1 [IU] via SUBCUTANEOUS

## 2022-12-04 MED ORDER — SODIUM CHLORIDE 0.9 % IV SOLN
100.0000 mg | Freq: Two times a day (BID) | INTRAVENOUS | Status: DC
  Administered 2022-12-04 – 2022-12-05 (×2): 100 mg via INTRAVENOUS
  Filled 2022-12-04 (×2): qty 100

## 2022-12-04 NOTE — Evaluation (Signed)
Physical Therapy Re-Evaluation Patient Details Name: Vanessa [name] MRN: [id number] DOB: [birth year] Today's Date: 12/04/2022  History of Present Illness  The pt is a 62 yo female presenting 5/29 with R foot wound. 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 No endovascular revascularization options. Now s/p R axillary to common femoral artery bypass on 6/5. Per chart review, 6/8 R 4th or 5th toe amputation PMH includes: uncontrolled DM II, PAD, COPD, HTN, tobacco use, and IBS.  Clinical Impression  Pt R LE WBAT with post op shoe. Pt requiring assist for donning post op shoe. Pt initially refusing RW for support, however on standing has LoB requiring min A for steadying and return to seated EoB. Pt reluctantly agreeable to use RW for ambulation to bathroom and in hallway. Pt with decreased command follow for RW use but ultimately does not require any outside assist. Pt will benefit from PT acutely and post acutely.     Recommendations for follow up therapy are one component of a multi-disciplinary discharge planning process, led by the attending physician.  Recommendations may be updated based on patient status, additional functional criteria and insurance authorization.     Assistance Recommended at Discharge Intermittent Supervision/Assistance  Patient can return home with the following  A little help with walking and/or transfers;A little help with bathing/dressing/bathroom;Assistance with cooking/housework;Assist for transportation;Help with stairs or ramp for entrance    Equipment Recommendations Rolling walker (2 wheels) (pending medical plan)     Functional Status Assessment Patient has had a recent decline in their functional status and demonstrates the ability to make significant improvements in function in a reasonable and predictable amount of time.     Precautions /  Restrictions Precautions Precautions: Fall Precaution Comments: pt with wound vac, groin incision, RLE gangrenous appearing 4-5th toes Restrictions Weight Bearing Restrictions: Yes Other Position/Activity Restrictions: RLE black post-op shoe in room WBAT      Mobility  Bed Mobility Overal bed mobility: Needs Assistance Bed Mobility: Supine to Sit     Supine to sit: Supervision, HOB elevated     General bed mobility comments: able to come to long sitting, requires assist to don post op shoe, supervision for safety, HoB elevated and use of bed rail to pull to EoB,    Transfers Overall transfer level: Needs assistance   Transfers: Sit to/from Stand Sit to Stand: Min guard           General transfer comment: min guard for safety    Ambulation/Gait Ambulation/Gait assistance: Min assist, Min guard Gait Distance (Feet): 75 Feet Assistive device: None, Rolling walker (2 wheels) (reports she does not need RW however takes one step on post op shoe and has loss of balance requiring min A and sitting back on bed.) Gait Pattern/deviations: Step-to pattern, Decreased stride length, Decreased weight shift to right, Decreased stance time - right, Decreased step length - left, Shuffle, Antalgic Gait velocity: decreased Gait velocity interpretation: <1.8 ft/sec, indicate of risk for recurrent falls   General Gait Details: min guard for safety with ambulation utilizing RW, vc for proximity and upright posture, encouraged increased use of UE to offweight R LE,decreased carry over to commands        Balance Overall balance assessment: Needs assistance Sitting-balance support: No upper extremity supported, Feet supported Sitting balance-Leahy Scale: Good Sitting balance - Comments: pt defers today   Standing balance support: Single extremity  supported, During functional activity Standing balance-Leahy Scale: Fair                               Pertinent Vitals/Pain  Pain Assessment Pain Assessment: 0-10 Pain Score: 5  Pain Location: R foot Pain Descriptors / Indicators: Throbbing, Grimacing, Guarding    Home Living Family/patient expects to be discharged to:: Private residence Living Arrangements: Spouse/significant other Available Help at Discharge: Available PRN/intermittently;Family Type of Home: Mobile home Home Access: Ramped entrance       Home Layout: One level (one step down into dinning room and laundryroom) Home Equipment: Rolling Walker (2 wheels);Shower seat;BSC/3in1;Wheelchair - manual;Grab bars - tub/shower;Grab bars - toilet;Hand held shower head;Transport chair      Prior Function Prior Level of Function : Independent/Modified Independent             Mobility Comments: ambulates without AD, has a level of agoraphobia and does not go out often, is able to ambulate limited community distances although also reports use of scooter on occasion. ADLs Comments: no use of DME in the home, uses scooter for long distance due to pain in legs with community mobility     Hand Dominance   Dominant Hand: Right    Extremity/Trunk Assessment        Lower Extremity Assessment RLE Deficits / Details: pain secondary to 4th and 5th ray amputation LLE Deficits / Details: grossly 4/5 to MMT. pt reports sensation better compared to before surgery, no differences between RLE and LLE    Cervical / Trunk Assessment Cervical / Trunk Assessment: Kyphotic  Communication   Communication: No difficulties  Cognition Arousal/Alertness: Awake/alert Behavior During Therapy: WFL for tasks assessed/performed Overall Cognitive Status: Within Functional Limits for tasks assessed                                 General Comments: decreased safety awareness with post-op shoe        General Comments General comments (skin integrity, edema, etc.): VSS on RA, declines sitting up in recliner after ambulation     Assessment/Plan     PT Assessment Patient needs continued PT services  PT Problem List Decreased balance;Decreased activity tolerance       PT Treatment Interventions Gait training;Functional mobility training;Therapeutic activities;Therapeutic exercise;Balance training    PT Goals (Current goals can be found in the Care Plan section)  Acute Rehab PT Goals Patient Stated Goal: return home PT Goal Formulation: With patient Time For Goal Achievement: 12/14/22 Potential to Achieve Goals: Good    Frequency Min 1X/week        AM-PAC PT "6 Clicks" Mobility  Outcome Measure Help needed turning from your back to your side while in a flat bed without using bedrails?: None Help needed moving from lying on your back to sitting on the side of a flat bed without using bedrails?: None Help needed moving to and from a bed to a chair (including a wheelchair)?: A Little Help needed standing up from a chair using your arms (e.g., wheelchair or bedside chair)?: A Little Help needed to walk in hospital room?: A Little Help needed climbing 3-5 steps with a railing? : Total 6 Click Score: 18    End of Session Equipment Utilized During Treatment: Gait belt Activity Tolerance: Patient tolerated treatment well Patient left: in bed;with call bell/phone within reach Nurse Communication: Mobility status PT  Visit Diagnosis: Other abnormalities of gait and mobility (R26.89);Muscle weakness (generalized) (M62.81)    Time: 1610-9604 PT Time Calculation (min) (ACUTE ONLY): 18 min   Charges:     PT Treatments $Gait Training: 8-22 mins        Jisell Majer B. Beverely Risen PT, DPT Acute Rehabilitation Services Please use secure chat or  Call Office [phone]   Elon Alas Fleet 12/04/2022, 11:22 AM

## 2022-12-04 NOTE — Progress Notes (Signed)
Subjective:  Patient ID: Vanessa [name], female    DOB: 09-09-1960,  MRN: [id number]  Patient  POD#2 s/p partial 4th and 5th ray amputations. Relates doing well with minimal pain. Denies n/v/f/c.   Past Medical History:  Diagnosis Date   Asthma    No PFT performed   Blood transfusion without reported diagnosis    has to donate blood due to having "thick" blood.   Complication of anesthesia    Woken up afterwards with panic attacks   COPD (chronic obstructive pulmonary disease) (HCC)    Depression    Diabetes mellitus 2002   GERD (gastroesophageal reflux disease)    Helicobacter pylori (H. pylori) infection    s/p triple therapy   Hepatic hemangioma    History of kidney stones    Hyperlipidemia    Hypertension    Hypertriglyceridemia    Panic attacks    mostly Agaraphobia   Pneumonia    Ventral hernia      Past Surgical History:  Procedure Laterality Date   ABDOMINAL AORTOGRAM W/LOWER EXTREMITY N/A 11/24/2022   Procedure: ABDOMINAL AORTOGRAM W/LOWER EXTREMITY;  Surgeon: Leonie Douglas, MD;  Location: [facility];  Service: Cardiovascular;  Laterality: N/A;   AMPUTATION Right 12/02/2022   Procedure: RAY AMPUTATION OF  RIGHT 5TH & 4TH TOES;  Surgeon: Pilar Plate, DPM;  Location: [facility];  Service: Podiatry;  Laterality: Right;   APPLICATION OF WOUND VAC Right 11/29/2022   Procedure: APPLICATION OF WOUND VAC AND KERECIS MICRO WOUND GRAFT;  Surgeon: Nada Libman, MD;  Location: [facility];  Service: Vascular;  Laterality: Right;   AXILLARY-FEMORAL BYPASS GRAFT Right 11/29/2022   Procedure: RIGHT AXILLA ARTERY - RIGHT FEMORAL ARTERY BYPASS GRAFT USING 8mm X 70cm PROPATEN GORE GRAFT WITH REMOVABLE RINGS;  Surgeon: Nada Libman, MD;  Location: [facility];  Service: Vascular;  Laterality: Right;   CESAREAN SECTION     with 3 children   COLONOSCOPY  2018   OVARIAN CYST REMOVAL     Age 62   ROBOTIC ASSISTED BILATERAL SALPINGO OOPHERECTOMY Bilateral 11/23/2021   Procedure: XI  ROBOTIC ASSISTED BILATERAL SALPINGO OOPHORECTOMY,  MINI LAPAROTOMY;  Surgeon: Carver Fila, MD;  Location: [facility];  Service: Gynecology;  Laterality: Bilateral;   ROBOTIC ASSISTED TOTAL HYSTERECTOMY N/A 11/23/2021   Procedure: XI ROBOTIC ASSISTED TOTAL HYSTERECTOMY, STAGING, CYSTOSCOPY;  Surgeon: Carver Fila, MD;  Location: [facility];  Service: Gynecology;  Laterality: N/A;   TUBAL LIGATION     UPPER GASTROINTESTINAL ENDOSCOPY  2018       Latest Ref Rng & Units 12/04/2022    1:31 AM 12/03/2022    1:49 AM 12/02/2022    2:15 AM  CBC  WBC 4.0 - 10.5 K/uL 14.7  16.6  12.6   Hemoglobin 12.0 - 15.0 g/dL 40.9  81.1  91.4   Hematocrit 36.0 - 46.0 % 35.7  36.6  35.8   Platelets 150 - 400 K/uL 459  390  409        Latest Ref Rng & Units 12/04/2022    1:31 AM 12/03/2022    1:49 AM 12/02/2022    2:15 AM  BMP  Glucose 70 - 99 mg/dL 782  956  213   BUN 8 - 23 mg/dL 8  9  11    Creatinine 0.44 - 1.00 mg/dL 0.86  5.78  4.69   Sodium 135 - 145 mmol/L 132  132  136   Potassium 3.5 - 5.1 mmol/L 4.1  4.5  4.3   Chloride 98 - 111 mmol/L 94  94  98   CO2 22 - 32 mmol/L 29  26  30    Calcium 8.9 - 10.3 mg/dL 8.4  9.0  8.5      Objective:   Vitals:   12/04/22 0430 12/04/22 0753  BP: (!) 146/62 133/85  Pulse: 64 60  Resp: 17   Temp: 98.2 F (36.8 C) 98.3 F (36.8 C)  SpO2: 100%     General:AA&O x 3. Normal mood and affect   Vascular: DP and PT pulses 2/4 bilateral. Brisk capillary refill to all digits. Pedal hair present   Neruological. Epicritic sensation grossly intact.   Derm: Dressing clean dry and intact this afternoon. Shoe in place.   MSK: MMT 5/5 in dorsiflexion, plantar flexion, inversion and eversion. Normal joint ROM without pain or crepitus.       Assessment & Plan:  Patient was evaluated and treated and all questions answered.  DX: s/p right partial 4th and 5th ray amputation Dressing to remain clean dry and intact. Will plan to change Wednesday prior to  discharge.  Antibiotics: Continue IV antibiotics. Culture growing rare staph simulans and enterococcus. Awaiting pathology.  WBAT to right foot heel in post op shoe.  WBC 14.7 trending down.  S/p ax feb bypass per vascular.  Will continue to follow.   Louann Sjogren, DPM  Accessible via secure chat for questions or concerns.

## 2022-12-04 NOTE — Inpatient Diabetes Management (Signed)
Inpatient Diabetes Program Recommendations  AACE/ADA: New Consensus Statement on Inpatient Glycemic Control (2015)  Target Ranges:  Prepandial:   less than 140 mg/dL      Peak postprandial:   less than 180 mg/dL (1-2 hours)      Critically ill patients:  140 - 180 mg/dL   Lab Results  Component Value Date   GLUCAP 64 (L) 12/04/2022   HGBA1C 9.8 (H) 11/22/2022    Review of Glycemic Control  Latest Reference Range & Units 12/03/22 07:01 12/03/22 11:43 12/03/22 16:03 12/03/22 21:16 12/04/22 05:54 12/04/22 11:20  Glucose-Capillary 70 - 99 mg/dL 161 (H) 096 (H) 045 (H) 225 (H) 192 (H) 64 (L)   Diabetes history: DM 2 Outpatient Diabetes medications:  Soliqua 30 units daily Current orders for Inpatient glycemic control:  Novolog 0-15 units tid with meals Semglee 7 units q HS Inpatient Diabetes Program Recommendations:    Note insulin needs have been lower than what patient was taking at home.   Consider reducing Novolog correction to sensitive (0-9 units) tid with meals instead of moderate.  Likely will need reduction in Soliqua at d/c? Also patient is interested in Dexcom G7 at discharge.    Thanks,  Beryl Meager, RN, BC-ADM Inpatient Diabetes Coordinator Pager [phone] (8a-5p)

## 2022-12-04 NOTE — Progress Notes (Signed)
  Progress Note    12/04/2022 7:34 AM 2 Days Post-Op  Subjective:  R flank pain feels much better   Vitals:   12/03/22 2000 12/04/22 0430  BP: (!) 158/64 (!) 146/62  Pulse: 74 64  Resp: 18 17  Temp: 99 F (37.2 C) 98.2 F (36.8 C)  SpO2: 96% 100%   Physical Exam: Lungs:  non labored Incisions:  R chest incision healing well; prevena with good seal in R groin Extremities:  brisk R PT by doppler Neurologic: A&O  CBC    Component Value Date/Time   WBC 14.7 (H) 12/04/2022 0131   RBC 3.75 (L) 12/04/2022 0131   HGB 11.0 (L) 12/04/2022 0131   HGB 17.0 (H) 09/21/2021 1543   HCT 35.7 (L) 12/04/2022 0131   HCT 49.4 (H) 09/21/2021 1543   PLT 459 (H) 12/04/2022 0131   PLT 464 (H) 09/21/2021 1543   MCV 95.2 12/04/2022 0131   MCV 88 09/21/2021 1543   MCH 29.3 12/04/2022 0131   MCHC 30.8 12/04/2022 0131   RDW 13.9 12/04/2022 0131   RDW 12.1 09/21/2021 1543   LYMPHSABS 1.8 12/03/2022 0149   LYMPHSABS 3.1 09/21/2021 1543   MONOABS 0.9 12/03/2022 0149   EOSABS 0.1 12/03/2022 0149   EOSABS 0.4 09/21/2021 1543   BASOSABS 0.1 12/03/2022 0149   BASOSABS 0.1 09/21/2021 1543    BMET    Component Value Date/Time   NA 132 (L) 12/04/2022 0131   NA 142 09/21/2021 1543   K 4.1 12/04/2022 0131   CL 94 (L) 12/04/2022 0131   CO2 29 12/04/2022 0131   GLUCOSE 268 (H) 12/04/2022 0131   BUN 8 12/04/2022 0131   BUN 8 09/21/2021 1543   CREATININE 0.58 12/04/2022 0131   CREATININE 0.56 11/04/2021 1019   CREATININE 0.45 (L) 12/03/2014 1542   CALCIUM 8.4 (L) 12/04/2022 0131   GFRNONAA >60 12/04/2022 0131   GFRNONAA >60 11/04/2021 1019   GFRNONAA >89 12/03/2014 1542   GFRAA 125 03/11/2020 1644   GFRAA >89 12/03/2014 1542    INR    Component Value Date/Time   INR 1.0 11/22/2022 1142     Intake/Output Summary (Last 24 hours) at 12/04/2022 0734 Last data filed at 12/03/2022 2000 Gross per 24 hour  Intake 300 ml  Output 2200 ml  Net -1900 ml     Assessment/Plan:  62 y.o.  female is s/p R ax-fem with subsequent 4,5 toe amp 2 Days Post-Op   R foot well perfused with brisk PT by doppler; dressing left in place Incision of R chest is healing well; incisional vac with good seal in R groin; will take off on Wednesday or at discharge, which ever comes first OOB; ambulate with heel shoe Continue asa and statin Ok for d/c from our standpoint   [name], PA-C [facility] [phone] 12/04/2022 7:34 AM

## 2022-12-04 NOTE — Progress Notes (Signed)
HD#12 Subjective:   Summary: Vanessa [name] is a 62 y.o. female with pertinent PMH of T2DM, COPD, HTN, and IBS who presents with progressively worsening ulcer on her right foot and is admitted for right foot osteomyelitis.  Doing well this morning after undergoing right fourth/fifth ray amputation 2 days ago.  She has been weightbearing as tolerated in her surgical shoe.  She has improving postop soreness on her right side from her vascular surgery.  She denies any new or worsening issues.  Objective:  Vital signs in last 24 hours: Vitals:   12/03/22 1141 12/03/22 1558 12/03/22 2000 12/04/22 0430  BP: 116/70 (!) 138/47 (!) 158/64 (!) 146/62  Pulse: 63 64 74 64  Resp: 14 16 18 17   Temp: (!) 97.5 F (36.4 C) 98.1 F (36.7 C) 99 F (37.2 C) 98.2 F (36.8 C)  TempSrc: Oral Oral Oral Oral  SpO2: 90% 90% 96% 100%  Weight:      Height:       Supplemental O2: room air   Physical Exam:  Constitutional: Elderly female. In no acute distress. Pulm: Normal work of breathing on room air, clear to auscultation bilaterally MSK/skin: Pitting edema in the right lower extremity up to the knee.  Ace wrapped right foot and ankle without evidence of soiling.  Neuro:Alert and oriented x3. No focal deficit noted.  Filed Weights   11/29/22 0500 12/02/22 0500 12/03/22 0517  Weight: 69.1 kg 73.7 kg 77.3 kg      Intake/Output Summary (Last 24 hours) at 12/04/2022 1610 Last data filed at 12/03/2022 2000 Gross per 24 hour  Intake 300 ml  Output 2200 ml  Net -1900 ml   Net IO Since Admission: -1,095.27 mL [12/04/22 0623]  Pertinent Labs:    Latest Ref Rng & Units 12/04/2022    1:31 AM 12/03/2022    1:49 AM 12/02/2022    2:15 AM  CBC  WBC 4.0 - 10.5 K/uL 14.7  16.6  12.6   Hemoglobin 12.0 - 15.0 g/dL 96.0  45.4  09.8   Hematocrit 36.0 - 46.0 % 35.7  36.6  35.8   Platelets 150 - 400 K/uL 459  390  409        Latest Ref Rng & Units 12/04/2022    1:31 AM 12/03/2022    1:49 AM 12/02/2022    2:15  AM  CMP  Glucose 70 - 99 mg/dL 119  147  829   BUN 8 - 23 mg/dL 8  9  11    Creatinine 0.44 - 1.00 mg/dL 5.62  1.30  8.65   Sodium 135 - 145 mmol/L 132  132  136   Potassium 3.5 - 5.1 mmol/L 4.1  4.5  4.3   Chloride 98 - 111 mmol/L 94  94  98   CO2 22 - 32 mmol/L 29  26  30    Calcium 8.9 - 10.3 mg/dL 8.4  9.0  8.5     Assessment/Plan:   Principal Problem:   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) Active Problems:   Gangrene of toe of right foot (HCC)   Osteomyelitis of fourth toe of right foot [facility])   Patient Summary: Vanessa [name] is a 62 y.o. female with pertinent PMH of T2DM, COPD, HTN, and IBS who presents with progressively worsening ulcer on her right foot and is admitted for right foot osteomyelitis and currently pending vascular or surgical intervention, on hospital day 12.  Osteomyelitis of the right foot fourth  phalanx with gangrenous fifth toe Cellulitis of the right foot, resolved Nonhealing plantar ulcer on the right foot Peripheral arterial disease Doing well postop axillary femoral bypass graft on 11/29/2022 and right 4/5th ray amputation 12/02/2022. - Ceftriaxone, day 13 of antibiotics, monitoring surgical cultures from 6/8 - Plan for wound changes or removals on 6/12, continue weightbearing as tolerated in surgical shoe - Pain control with Tylenol 1000 mg every 8 hours and Dilaudid 1 mg IV every 2 hours as needed  HFmrEF TTE for cardiac clearance showed LVEF of 50 to 55%.  Stable on room air.   T2DM A1c 10/25/2022 was 10.6 and 9.8 here.  She is on Soliqua 30 units nightly at home which she was recently started on in our clinic.  She does want a Dexcom which we will address closer to discharge or on follow-up.   - Semglee 7 units nightly and moderate SSI -- Continue CBG monitoring, CM diet    COPD --incentive spirometer --Continue home inhaler equivalents with Breo Ellipta, Incruse Ellipta, and albuterol.  Will optimize with Trelegy on discharge.    Hypertension Blood pressures have been stable after having brief hypotensive events after vascular surgery.  Adding back lisinopril at half dose. - Lisinopril 5 mg daily   Anxiety/depression Tobacco use disorder Will continue home medications of bupropion and citalopram.  Will need continued tobacco cessation counseling going forward. - Bupropion 150 mg twice daily, citalopram 40 mg daily, and nicotine patch daily as needed   HLD Repeat lipid panel showed LDL of 129.   --Continue home atorvastatin 80 mg daily and add ezetimibe 10 mg daily  Diet: Carb-Modified VTE: Enoxaparin Code: Full  Dispo: Anticipated discharge home on/after 12/06/2022 pending continued stability and clear surgical cultures.  Rocky Morel, DO 12/04/2022, 6:23 AM Internal Medicine Resident, PGY-1 Pager# [phone] Please contact the on call pager after 5 pm and on weekends at [phone].

## 2022-12-04 NOTE — Plan of Care (Signed)
  Problem: Education: Goal: Ability to describe self-care measures that may prevent or decrease complications (Diabetes Survival Skills Education) will improve Outcome: Progressing Goal: Individualized Educational Video(s) Outcome: Progressing   Problem: Coping: Goal: Ability to adjust to condition or change in health will improve Outcome: Progressing   Problem: Fluid Volume: Goal: Ability to maintain a balanced intake and output will improve Outcome: Progressing   Problem: Health Behavior/Discharge Planning: Goal: Ability to identify and utilize available resources and services will improve Outcome: Progressing Goal: Ability to manage health-related needs will improve Outcome: Progressing   Problem: Metabolic: Goal: Ability to maintain appropriate glucose levels will improve Outcome: Progressing   Problem: Nutritional: Goal: Maintenance of adequate nutrition will improve Outcome: Progressing Goal: Progress toward achieving an optimal weight will improve Outcome: Progressing   Problem: Skin Integrity: Goal: Risk for impaired skin integrity will decrease Outcome: Progressing   Problem: Tissue Perfusion: Goal: Adequacy of tissue perfusion will improve Outcome: Progressing   Problem: Education: Goal: Knowledge of General Education information will improve Description: Including pain rating scale, medication(s)/side effects and non-pharmacologic comfort measures Outcome: Progressing   Problem: Health Behavior/Discharge Planning: Goal: Ability to manage health-related needs will improve Outcome: Progressing   Problem: Clinical Measurements: Goal: Ability to maintain clinical measurements within normal limits will improve Outcome: Progressing Goal: Will remain free from infection Outcome: Progressing Goal: Diagnostic test results will improve Outcome: Progressing Goal: Respiratory complications will improve Outcome: Progressing Goal: Cardiovascular complication will  be avoided Outcome: Progressing   Problem: Activity: Goal: Risk for activity intolerance will decrease Outcome: Progressing   Problem: Nutrition: Goal: Adequate nutrition will be maintained Outcome: Progressing   Problem: Coping: Goal: Level of anxiety will decrease Outcome: Progressing   Problem: Elimination: Goal: Will not experience complications related to bowel motility Outcome: Progressing Goal: Will not experience complications related to urinary retention Outcome: Progressing   Problem: Pain Managment: Goal: General experience of comfort will improve Outcome: Progressing   Problem: Safety: Goal: Ability to remain free from injury will improve Outcome: Progressing   Problem: Skin Integrity: Goal: Risk for impaired skin integrity will decrease Outcome: Progressing   Problem: Education: Goal: Understanding of CV disease, CV risk reduction, and recovery process will improve Outcome: Progressing Goal: Individualized Educational Video(s) Outcome: Progressing   Problem: Activity: Goal: Ability to return to baseline activity level will improve Outcome: Progressing   Problem: Cardiovascular: Goal: Ability to achieve and maintain adequate cardiovascular perfusion will improve Outcome: Progressing Goal: Vascular access site(s) Level 0-1 will be maintained Outcome: Progressing   Problem: Health Behavior/Discharge Planning: Goal: Ability to safely manage health-related needs after discharge will improve Outcome: Progressing   Problem: Education: Goal: Knowledge of prescribed regimen will improve Outcome: Progressing   Problem: Activity: Goal: Ability to tolerate increased activity will improve Outcome: Progressing   Problem: Bowel/Gastric: Goal: Gastrointestinal status for postoperative course will improve Outcome: Progressing   Problem: Clinical Measurements: Goal: Postoperative complications will be avoided or minimized Outcome: Progressing Goal: Signs  and symptoms of graft occlusion will improve Outcome: Progressing   Problem: Skin Integrity: Goal: Demonstration of wound healing without infection will improve Outcome: Progressing

## 2022-12-04 NOTE — Progress Notes (Signed)
Hypoglycemic Event  CBG: 64  Treatment: 8 oz juice/soda  Symptoms: None  Follow-up CBG: Time:1155 CBG Result:64  Possible Reasons for Event: Unknown  Hypoglycemic Event  CBG: 64  Treatment: 8 oz juice/soda  Symptoms: None  Follow-up CBG: Time:1216 CBG Result:70  Possible Reasons for Event: Unknown  Comments/MD notified:pt has lunch tray currently eating lunch       Anntoinette Haefele M Chantrice Hagg

## 2022-12-05 DIAGNOSIS — E1169 Type 2 diabetes mellitus with other specified complication: Secondary | ICD-10-CM | POA: Diagnosis not present

## 2022-12-05 DIAGNOSIS — E1151 Type 2 diabetes mellitus with diabetic peripheral angiopathy without gangrene: Secondary | ICD-10-CM | POA: Diagnosis not present

## 2022-12-05 DIAGNOSIS — L97519 Non-pressure chronic ulcer of other part of right foot with unspecified severity: Secondary | ICD-10-CM | POA: Diagnosis not present

## 2022-12-05 DIAGNOSIS — M86171 Other acute osteomyelitis, right ankle and foot: Secondary | ICD-10-CM | POA: Diagnosis not present

## 2022-12-05 LAB — CBC WITH DIFFERENTIAL/PLATELET
Abs Immature Granulocytes: 0.08 10*3/uL — ABNORMAL HIGH (ref 0.00–0.07)
Basophils Absolute: 0.1 10*3/uL (ref 0.0–0.1)
Basophils Relative: 1 %
Eosinophils Absolute: 0.9 10*3/uL — ABNORMAL HIGH (ref 0.0–0.5)
Eosinophils Relative: 6 %
HCT: 35.6 % — ABNORMAL LOW (ref 36.0–46.0)
Hemoglobin: 11.1 g/dL — ABNORMAL LOW (ref 12.0–15.0)
Immature Granulocytes: 1 %
Lymphocytes Relative: 21 %
Lymphs Abs: 3.1 10*3/uL (ref 0.7–4.0)
MCH: 28.8 pg (ref 26.0–34.0)
MCHC: 31.2 g/dL (ref 30.0–36.0)
MCV: 92.2 fL (ref 80.0–100.0)
Monocytes Absolute: 0.9 10*3/uL (ref 0.1–1.0)
Monocytes Relative: 6 %
Neutro Abs: 9.7 10*3/uL — ABNORMAL HIGH (ref 1.7–7.7)
Neutrophils Relative %: 65 %
Platelets: 511 10*3/uL — ABNORMAL HIGH (ref 150–400)
RBC: 3.86 MIL/uL — ABNORMAL LOW (ref 3.87–5.11)
RDW: 14.3 % (ref 11.5–15.5)
WBC: 14.8 10*3/uL — ABNORMAL HIGH (ref 4.0–10.5)
nRBC: 0 % (ref 0.0–0.2)

## 2022-12-05 LAB — RENAL FUNCTION PANEL
Albumin: 2.5 g/dL — ABNORMAL LOW (ref 3.5–5.0)
Anion gap: 8 (ref 5–15)
BUN: 8 mg/dL (ref 8–23)
CO2: 29 mmol/L (ref 22–32)
Calcium: 8.7 mg/dL — ABNORMAL LOW (ref 8.9–10.3)
Chloride: 98 mmol/L (ref 98–111)
Creatinine, Ser: 0.59 mg/dL (ref 0.44–1.00)
GFR, Estimated: >60 mL/min (ref >60)
Glucose, Bld: 263 mg/dL — ABNORMAL HIGH (ref 70–99)
Phosphorus: 3.8 mg/dL (ref 2.5–4.6)
Potassium: 4.1 mmol/L (ref 3.5–5.1)
Sodium: 135 mmol/L (ref 135–145)

## 2022-12-05 LAB — AEROBIC/ANAEROBIC CULTURE W GRAM STAIN (SURGICAL/DEEP WOUND): Gram Stain: NONE SEEN

## 2022-12-05 LAB — SURGICAL PATHOLOGY

## 2022-12-05 LAB — GLUCOSE, CAPILLARY
Glucose-Capillary: 314 mg/dL — ABNORMAL HIGH (ref 70–99)
Glucose-Capillary: 146 mg/dL — ABNORMAL HIGH (ref 70–99)
Glucose-Capillary: 140 mg/dL — ABNORMAL HIGH (ref 70–99)
Glucose-Capillary: 200 mg/dL — ABNORMAL HIGH (ref 70–99)

## 2022-12-05 MED ORDER — DOXYCYCLINE HYCLATE 100 MG PO TABS
100.0000 mg | ORAL_TABLET | Freq: Two times a day (BID) | ORAL | Status: DC
  Administered 2022-12-05 – 2022-12-06 (×2): 100 mg via ORAL
  Filled 2022-12-05 (×3): qty 1

## 2022-12-05 MED ORDER — INSULIN GLARGINE-YFGN 100 UNIT/ML ~~LOC~~ SOLN
10.0000 [IU] | Freq: Every day | SUBCUTANEOUS | Status: DC
  Administered 2022-12-05: 10 [IU] via SUBCUTANEOUS
  Filled 2022-12-05 (×2): qty 0.1

## 2022-12-05 MED ORDER — AMOXICILLIN-POT CLAVULANATE 875-125 MG PO TABS
1.0000 | ORAL_TABLET | Freq: Two times a day (BID) | ORAL | Status: DC
  Administered 2022-12-05 – 2022-12-06 (×3): 1 via ORAL
  Filled 2022-12-05 (×4): qty 1

## 2022-12-05 NOTE — Discharge Instructions (Signed)
 [facility] [place]  Discharge instructions  Lower Extremity Bypass Surgery  Please refer to the following instruction for your post-procedure care. Your surgeon or physician assistant will discuss any changes with you.  Activity  You are encouraged to walk as much as you can. You can slowly return to normal activities during the month after your surgery. Avoid strenuous activity and heavy lifting until your doctor tells you it's OK. Avoid activities such as vacuuming or swinging a golf club. Do not drive until your doctor give the OK and you are no longer taking prescription pain medications. It is also normal to have difficulty with sleep habits, eating and bowel movement after surgery. These will go away with time.  Bathing/Showering  You may shower after you go home. Do not soak in a bathtub, hot tub, or swim until the incision heals completely.  Incision Care  Clean your incision with mild soap and water. Shower every day. Pat the area dry with a clean towel. You do not need a bandage unless otherwise instructed. Do not apply any ointments or creams to your incision. If you have open wounds you will be instructed how to care for them or a visiting nurse may be arranged for you. If you have staples or sutures along your incision they will be removed at your post-op appointment. You may have skin glue on your incision. Do not peel it off. It will come off on its own in about one week. If you have a great deal of moisture in your groin, use a gauze help keep this area dry.  Diet  Resume your normal diet. There are no special food restrictions following this procedure. A low fat/ low cholesterol diet is recommended for all patients with vascular disease. In order to heal from your surgery, it is CRITICAL to get adequate nutrition. Your body requires vitamins, minerals, and protein. Vegetables are the best source of vitamins and minerals. Vegetables also provide the  perfect balance of protein. Processed food has little nutritional value, so try to avoid this.  Medications  Resume taking all your medications unless your doctor or nurse practitioner tells you not to. If your incision is causing pain, you may take over-the-counter pain relievers such as acetaminophen (Tylenol). If you were prescribed a stronger pain medication, please aware these medication can cause nausea and constipation. Prevent nausea by taking the medication with a snack or meal. Avoid constipation by drinking plenty of fluids and eating foods with high amount of fiber, such as fruits, vegetables, and grains. Take Colase 100 mg (an over-the-counter stool softener) twice a day as needed for constipation. Do not take Tylenol if you are taking prescription pain medications.  Follow Up  Our office will schedule a follow up appointment 2-3 weeks following discharge.  Please call us immediately for any of the following conditions  Severe or worsening pain in your legs or feet while at rest or while walking Increase pain, redness, warmth, or drainage (pus) from your incision site(s) Fever of 101 degree or higher The swelling in your leg with the bypass suddenly worsens and becomes more painful than when you were in the hospital If you have been instructed to feel your graft pulse then you should do so every day. If you can no longer feel this pulse, call the office immediately. Not all patients are given this instruction.  Leg swelling is common after leg bypass surgery.  The swelling should improve over a few months   following surgery. To improve the swelling, you may elevate your legs above the level of your heart while you are sitting or resting. Your surgeon or physician assistant may ask you to apply an ACE wrap or wear compression (TED) stockings to help to reduce swelling.  Reduce your risk of vascular disease  Stop smoking. If you would like help call [facility] at 1-800-QUIT-NOW  (832-364-6368) or [place] at [phone].  Manage your cholesterol Maintain a desired weight Control your diabetes weight Control your diabetes Keep your blood pressure down  If you have any questions, please call the office at [phone]

## 2022-12-05 NOTE — Progress Notes (Addendum)
  Progress Note    12/05/2022 7:47 AM 3 Days Post-Op  Subjective:  no complaints   Vitals:   12/04/22 2323 12/05/22 0437  BP: (!) 128/93 (!) 160/47  Pulse:  70  Resp: 18 17  Temp: 98 F (36.7 C) 98.1 F (36.7 C)  SpO2: 100% 98%   Physical Exam: Lungs:  non labored Incisions:  R chest c/d/I; R groin with incisional vac Extremities:  PT brisk by doppler RLE Neurologic: A&O  CBC    Component Value Date/Time   WBC 14.8 (H) 12/05/2022 0133   RBC 3.86 (L) 12/05/2022 0133   HGB 11.1 (L) 12/05/2022 0133   HGB 17.0 (H) 09/21/2021 1543   HCT 35.6 (L) 12/05/2022 0133   HCT 49.4 (H) 09/21/2021 1543   PLT 511 (H) 12/05/2022 0133   PLT 464 (H) 09/21/2021 1543   MCV 92.2 12/05/2022 0133   MCV 88 09/21/2021 1543   MCH 28.8 12/05/2022 0133   MCHC 31.2 12/05/2022 0133   RDW 14.3 12/05/2022 0133   RDW 12.1 09/21/2021 1543   LYMPHSABS 3.1 12/05/2022 0133   LYMPHSABS 3.1 09/21/2021 1543   MONOABS 0.9 12/05/2022 0133   EOSABS 0.9 (H) 12/05/2022 0133   EOSABS 0.4 09/21/2021 1543   BASOSABS 0.1 12/05/2022 0133   BASOSABS 0.1 09/21/2021 1543    BMET    Component Value Date/Time   NA 135 12/05/2022 0133   NA 142 09/21/2021 1543   K 4.1 12/05/2022 0133   CL 98 12/05/2022 0133   CO2 29 12/05/2022 0133   GLUCOSE 263 (H) 12/05/2022 0133   BUN 8 12/05/2022 0133   BUN 8 09/21/2021 1543   CREATININE 0.59 12/05/2022 0133   CREATININE 0.56 11/04/2021 1019   CREATININE 0.45 (L) 12/03/2014 1542   CALCIUM 8.7 (L) 12/05/2022 0133   GFRNONAA >60 12/05/2022 0133   GFRNONAA >60 11/04/2021 1019   GFRNONAA >89 12/03/2014 1542   GFRAA 125 03/11/2020 1644   GFRAA >89 12/03/2014 1542    INR    Component Value Date/Time   INR 1.0 11/22/2022 1142     Intake/Output Summary (Last 24 hours) at 12/05/2022 0747 Last data filed at 12/05/2022 1610 Gross per 24 hour  Intake 610.14 ml  Output 2200 ml  Net -1589.86 ml     Assessment/Plan:  62 y.o. female is s/p R ax-fem bypass with  subsequent 4,5 toe amp 3 Days Post-Op   R foot well perfused with brisk PT by doppler; wound care R foot per Podiatry Heel weightbearing currently; PT/OT recommending HH PT Ok for d/c from vascular standpoint; incisional vac will be removed tomorrow or at discharge, whichever comes first Office will arrange follow up in 2-3 weeks   [name], PA-C [facility] [phone] 12/05/2022 7:47 AM  I agree with the above.  I have seen and evaluated the patient.  She is doing very well.  I removed her Prevena wound VAC from the right groin.  The incision is clean dry and intact.  Having instructed her to keep this area clean and dry when she goes home.  She is stable for discharge from my perspective.  [name]

## 2022-12-05 NOTE — Plan of Care (Signed)
Problem: Education: Goal: Ability to describe self-care measures that may prevent or decrease complications (Diabetes Survival Skills Education) will improve Outcome: Progressing Goal: Individualized Educational Video(s) Outcome: Progressing   Problem: Coping: Goal: Ability to adjust to condition or change in health will improve Outcome: Progressing   Problem: Fluid Volume: Goal: Ability to maintain a balanced intake and output will improve Outcome: Progressing   Problem: Health Behavior/Discharge Planning: Goal: Ability to identify and utilize available resources and services will improve Outcome: Progressing Goal: Ability to manage health-related needs will improve Outcome: Progressing   Problem: Metabolic: Goal: Ability to maintain appropriate glucose levels will improve Outcome: Progressing   Problem: Nutritional: Goal: Maintenance of adequate nutrition will improve Outcome: Progressing Goal: Progress toward achieving an optimal weight will improve Outcome: Progressing   Problem: Skin Integrity: Goal: Risk for impaired skin integrity will decrease Outcome: Progressing   Problem: Tissue Perfusion: Goal: Adequacy of tissue perfusion will improve Outcome: Progressing   Problem: Education: Goal: Ability to describe self-care measures that may prevent or decrease complications (Diabetes Survival Skills Education) will improve Outcome: Progressing Goal: Individualized Educational Video(s) Outcome: Progressing   Problem: Coping: Goal: Ability to adjust to condition or change in health will improve Outcome: Progressing   Problem: Fluid Volume: Goal: Ability to maintain a balanced intake and output will improve Outcome: Progressing   Problem: Health Behavior/Discharge Planning: Goal: Ability to identify and utilize available resources and services will improve Outcome: Progressing Goal: Ability to manage health-related needs will improve Outcome: Progressing    Problem: Metabolic: Goal: Ability to maintain appropriate glucose levels will improve Outcome: Progressing   Problem: Nutritional: Goal: Maintenance of adequate nutrition will improve Outcome: Progressing Goal: Progress toward achieving an optimal weight will improve Outcome: Progressing   Problem: Skin Integrity: Goal: Risk for impaired skin integrity will decrease Outcome: Progressing   Problem: Tissue Perfusion: Goal: Adequacy of tissue perfusion will improve Outcome: Progressing   Problem: Education: Goal: Knowledge of General Education information will improve Description: Including pain rating scale, medication(s)/side effects and non-pharmacologic comfort measures Outcome: Progressing   Problem: Health Behavior/Discharge Planning: Goal: Ability to manage health-related needs will improve Outcome: Progressing   Problem: Clinical Measurements: Goal: Ability to maintain clinical measurements within normal limits will improve Outcome: Progressing Goal: Will remain free from infection Outcome: Progressing Goal: Diagnostic test results will improve Outcome: Progressing Goal: Respiratory complications will improve Outcome: Progressing Goal: Cardiovascular complication will be avoided Outcome: Progressing   Problem: Activity: Goal: Risk for activity intolerance will decrease Outcome: Progressing   Problem: Nutrition: Goal: Adequate nutrition will be maintained Outcome: Progressing   Problem: Coping: Goal: Level of anxiety will decrease Outcome: Progressing   Problem: Elimination: Goal: Will not experience complications related to bowel motility Outcome: Progressing Goal: Will not experience complications related to urinary retention Outcome: Progressing   Problem: Pain Managment: Goal: General experience of comfort will improve Outcome: Progressing   Problem: Safety: Goal: Ability to remain free from injury will improve Outcome: Progressing   Problem:  Skin Integrity: Goal: Risk for impaired skin integrity will decrease Outcome: Progressing   Problem: Education: Goal: Understanding of CV disease, CV risk reduction, and recovery process will improve Outcome: Progressing Goal: Individualized Educational Video(s) Outcome: Progressing   Problem: Activity: Goal: Ability to return to baseline activity level will improve Outcome: Progressing   Problem: Cardiovascular: Goal: Ability to achieve and maintain adequate cardiovascular perfusion will improve Outcome: Progressing Goal: Vascular access site(s) Level 0-1 will be maintained Outcome: Progressing   Problem: Health Behavior/Discharge Planning:  Goal: Ability to safely manage health-related needs after discharge will improve Outcome: Progressing   Problem: Education: Goal: Knowledge of prescribed regimen will improve Outcome: Progressing

## 2022-12-05 NOTE — Progress Notes (Signed)
HD#13 Subjective:   Summary: Vanessa [name] is a 62 y.o. female with pertinent PMH of T2DM, COPD, HTN, and IBS who presents with progressively worsening ulcer on her right foot and is admitted for right foot osteomyelitis.  Doing very well this morning.  Continues to work well with PT and is excited to get home.  Objective:  Vital signs in last 24 hours: Vitals:   12/04/22 1557 12/04/22 2110 12/04/22 2323 12/05/22 0437  BP: (!) 144/42 101/62 (!) 128/93 (!) 160/47  Pulse: 73   70  Resp:  18 18 17   Temp: 98.7 F (37.1 C) 98.2 F (36.8 C) 98 F (36.7 C) 98.1 F (36.7 C)  TempSrc: Oral Oral Oral Oral  SpO2: 96% 98% 100% 98%  Weight:      Height:       Supplemental O2: room air   Physical Exam:  Constitutional: Elderly female. In no acute distress. Pulm: Normal work of breathing on room air, clear to auscultation bilaterally MSK/skin: Pitting edema in the right lower extremity up to the knee.  Ace wrapped right foot and ankle without evidence of soiling.  Neuro:Alert and oriented x3. No focal deficit noted.  Filed Weights   11/29/22 0500 12/02/22 0500 12/03/22 0517  Weight: 69.1 kg 73.7 kg 77.3 kg      Intake/Output Summary (Last 24 hours) at 12/05/2022 0651 Last data filed at 12/05/2022 1610 Gross per 24 hour  Intake 610.14 ml  Output 2200 ml  Net -1589.86 ml   Net IO Since Admission: -2,685.13 mL [12/05/22 0651]  Pertinent Labs:    Latest Ref Rng & Units 12/05/2022    1:33 AM 12/04/2022    1:31 AM 12/03/2022    1:49 AM  CBC  WBC 4.0 - 10.5 K/uL 14.8  14.7  16.6   Hemoglobin 12.0 - 15.0 g/dL 96.0  45.4  09.8   Hematocrit 36.0 - 46.0 % 35.6  35.7  36.6   Platelets 150 - 400 K/uL 511  459  390        Latest Ref Rng & Units 12/05/2022    1:33 AM 12/04/2022    1:31 AM 12/03/2022    1:49 AM  CMP  Glucose 70 - 99 mg/dL 119  147  829   BUN 8 - 23 mg/dL 8  8  9    Creatinine 0.44 - 1.00 mg/dL 5.62  1.30  8.65   Sodium 135 - 145 mmol/L 135  132  132   Potassium 3.5 -  5.1 mmol/L 4.1  4.1  4.5   Chloride 98 - 111 mmol/L 98  94  94   CO2 22 - 32 mmol/L 29  29  26    Calcium 8.9 - 10.3 mg/dL 8.7  8.4  9.0     Assessment/Plan:   Principal Problem:   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) Active Problems:   Gangrene of toe of right foot (HCC)   Osteomyelitis of fourth toe of right foot [facility])   Patient Summary: Vanessa [name] is a 62 y.o. female with pertinent PMH of T2DM, COPD, HTN, and IBS who presents with progressively worsening ulcer on her right foot and is admitted for right foot osteomyelitis and currently pending vascular or surgical intervention, on hospital day 13.  Osteomyelitis of the right foot fourth phalanx with gangrenous fifth toe Cellulitis of the right foot, resolved Nonhealing plantar ulcer on the right foot Peripheral arterial disease Doing well postop axillary femoral bypass graft on  11/29/2022 and right 4/5th ray amputation 12/02/2022.  Surgical cultures showed rare staph simulans and e. faecalis.  Transitioned yesterday to Unasyn and doxycycline and can be discharged on Augmentin and doxycycline. - Augmentin and doxycycline, day 14 of antibiotics, will clarify with podiatry and after completion of surgical cultures this afternoon for duration - Plan for wound changes or removals on 6/12, continue weightbearing as tolerated in surgical shoe - Pain control with Tylenol 1000 mg every 8 hours and Dilaudid 1 mg IV every 2 hours as needed - Stable for discharge today  HFmrEF TTE for cardiac clearance showed LVEF of 50 to 55%.  Stable on room air.   T2DM A1c 10/25/2022 was 10.6 and 9.8 here.  She is on Soliqua 30 units nightly at home which she was recently started on in our clinic.  She does want a Dexcom which we will address closer to discharge or on follow-up.   - Semglee 10 units nightly and moderate SSI -- Will plan for discharge on Soliqua but at a decreased dose and will titrate in the outpatient setting.   COPD --incentive  spirometer --Continue home inhaler equivalents with Breo Ellipta, Incruse Ellipta, and albuterol.  Will optimize with Trelegy on discharge.   Hypertension Blood pressures have been stable after having brief hypotensive events after vascular surgery.  Stable on half of home lisinopril dose. - Lisinopril 5 mg daily   Anxiety/depression Tobacco use disorder Will continue home medications of bupropion and citalopram.  Will need continued tobacco cessation counseling going forward. - Bupropion 150 mg twice daily, citalopram 40 mg daily, and nicotine patch daily as needed   HLD Repeat lipid panel showed LDL of 129.   --Continue home atorvastatin 80 mg daily and add ezetimibe 10 mg daily  Diet: Carb-Modified VTE: Enoxaparin Code: Full  Dispo: Anticipated discharge home today or tomorrow.  Rocky Morel, DO 12/05/2022, 6:51 AM Internal Medicine Resident, PGY-1 Pager# [phone] Please contact the on call pager after 5 pm and on weekends at [phone].

## 2022-12-06 ENCOUNTER — Other Ambulatory Visit (HOSPITAL_COMMUNITY): Payer: Self-pay

## 2022-12-06 DIAGNOSIS — M86171 Other acute osteomyelitis, right ankle and foot: Secondary | ICD-10-CM | POA: Diagnosis not present

## 2022-12-06 DIAGNOSIS — L97519 Non-pressure chronic ulcer of other part of right foot with unspecified severity: Secondary | ICD-10-CM | POA: Diagnosis not present

## 2022-12-06 DIAGNOSIS — E1151 Type 2 diabetes mellitus with diabetic peripheral angiopathy without gangrene: Secondary | ICD-10-CM | POA: Diagnosis not present

## 2022-12-06 DIAGNOSIS — E1169 Type 2 diabetes mellitus with other specified complication: Secondary | ICD-10-CM | POA: Diagnosis not present

## 2022-12-06 LAB — CBC WITH DIFFERENTIAL/PLATELET
Abs Immature Granulocytes: 0.06 10*3/uL (ref 0.00–0.07)
Basophils Absolute: 0.1 10*3/uL (ref 0.0–0.1)
Basophils Relative: 1 %
Eosinophils Absolute: 1 10*3/uL — ABNORMAL HIGH (ref 0.0–0.5)
Eosinophils Relative: 7 %
HCT: 40 % (ref 36.0–46.0)
Hemoglobin: 12.6 g/dL (ref 12.0–15.0)
Immature Granulocytes: 0 %
Lymphocytes Relative: 22 %
Lymphs Abs: 3.2 10*3/uL (ref 0.7–4.0)
MCH: 29 pg (ref 26.0–34.0)
MCHC: 31.5 g/dL (ref 30.0–36.0)
MCV: 92 fL (ref 80.0–100.0)
Monocytes Absolute: 1 10*3/uL (ref 0.1–1.0)
Monocytes Relative: 7 %
Neutro Abs: 9.4 10*3/uL — ABNORMAL HIGH (ref 1.7–7.7)
Neutrophils Relative %: 63 %
Platelets: 571 10*3/uL — ABNORMAL HIGH (ref 150–400)
RBC: 4.35 MIL/uL (ref 3.87–5.11)
RDW: 14.2 % (ref 11.5–15.5)
WBC: 14.7 10*3/uL — ABNORMAL HIGH (ref 4.0–10.5)
nRBC: 0 % (ref 0.0–0.2)

## 2022-12-06 LAB — GLUCOSE, CAPILLARY: Glucose-Capillary: 75 mg/dL (ref 70–99)

## 2022-12-06 MED ORDER — EZETIMIBE 10 MG PO TABS
10.0000 mg | ORAL_TABLET | Freq: Every day | ORAL | 3 refills | Status: DC
  Filled 2022-12-06: qty 30, 30d supply, fill #0
  Filled 2023-01-01: qty 30, 30d supply, fill #1
  Filled 2023-01-01: qty 30, 30d supply, fill #0

## 2022-12-06 MED ORDER — LISINOPRIL 5 MG PO TABS
5.0000 mg | ORAL_TABLET | Freq: Every day | ORAL | 0 refills | Status: DC
  Filled 2022-12-06: qty 30, 30d supply, fill #0

## 2022-12-06 MED ORDER — AMOXICILLIN-POT CLAVULANATE 875-125 MG PO TABS: 1.0000 | ORAL_TABLET | Freq: Two times a day (BID) | ORAL | Status: DC

## 2022-12-06 MED ORDER — LISINOPRIL 10 MG PO TABS: 10.0000 mg | ORAL_TABLET | Freq: Every day | ORAL | Status: DC

## 2022-12-06 MED ORDER — LISINOPRIL 5 MG PO TABS
5.0000 mg | ORAL_TABLET | Freq: Every day | ORAL | Status: DC
  Administered 2022-12-06: 5 mg via ORAL
  Filled 2022-12-06: qty 1

## 2022-12-06 MED ORDER — OXYCODONE HCL 5 MG PO TABS
5.0000 mg | ORAL_TABLET | ORAL | 0 refills | Status: AC | PRN
  Filled 2022-12-06: qty 18, 3d supply, fill #0

## 2022-12-06 MED ORDER — NICOTINE 21 MG/24HR TD PT24
21.0000 mg | MEDICATED_PATCH | Freq: Every day | TRANSDERMAL | 0 refills | Status: DC | PRN
  Filled 2022-12-06: qty 28, 28d supply, fill #0

## 2022-12-06 MED ORDER — ASPIRIN 81 MG PO TBEC
81.0000 mg | DELAYED_RELEASE_TABLET | Freq: Every day | ORAL | 12 refills | Status: AC
  Filled 2022-12-06: qty 30, 30d supply, fill #0

## 2022-12-06 MED ORDER — DOXYCYCLINE HYCLATE 100 MG PO TABS
100.0000 mg | ORAL_TABLET | Freq: Two times a day (BID) | ORAL | 0 refills | Status: AC
  Filled 2022-12-06: qty 8, 4d supply, fill #0

## 2022-12-06 MED ORDER — SOLIQUA 100-33 UNT-MCG/ML ~~LOC~~ SOPN
10.0000 [IU] | PEN_INJECTOR | Freq: Every day | SUBCUTANEOUS | 11 refills | Status: DC
Start: 2022-12-06 — End: 2023-12-13
  Filled 2022-12-06: qty 3, 30d supply, fill #0
  Filled 2023-01-01: qty 3, 28d supply, fill #1
  Filled 2023-01-01: qty 3, 30d supply, fill #0

## 2022-12-06 MED ORDER — TRELEGY ELLIPTA 200-62.5-25 MCG/ACT IN AEPB
1.0000 | INHALATION_SPRAY | Freq: Every day | RESPIRATORY_TRACT | 0 refills | Status: DC
  Filled 2022-12-06: qty 60, 30d supply, fill #0

## 2022-12-06 NOTE — Plan of Care (Signed)
  Problem: Education: Goal: Ability to describe self-care measures that may prevent or decrease complications (Diabetes Survival Skills Education) will improve Outcome: Adequate for Discharge   Problem: Coping: Goal: Ability to adjust to condition or change in health will improve Outcome: Adequate for Discharge   Problem: Fluid Volume: Goal: Ability to maintain a balanced intake and output will improve Outcome: Adequate for Discharge   Problem: Health Behavior/Discharge Planning: Goal: Ability to identify and utilize available resources and services will improve Outcome: Adequate for Discharge Goal: Ability to manage health-related needs will improve Outcome: Adequate for Discharge   Problem: Metabolic: Goal: Ability to maintain appropriate glucose levels will improve Outcome: Adequate for Discharge   Problem: Nutritional: Goal: Maintenance of adequate nutrition will improve Outcome: Adequate for Discharge Goal: Progress toward achieving an optimal weight will improve Outcome: Adequate for Discharge   Problem: Skin Integrity: Goal: Risk for impaired skin integrity will decrease Outcome: Adequate for Discharge   Problem: Tissue Perfusion: Goal: Adequacy of tissue perfusion will improve Outcome: Adequate for Discharge   Problem: Education: Goal: Knowledge of General Education information will improve Description: Including pain rating scale, medication(s)/side effects and non-pharmacologic comfort measures Outcome: Adequate for Discharge   Problem: Health Behavior/Discharge Planning: Goal: Ability to manage health-related needs will improve Outcome: Adequate for Discharge   Problem: Clinical Measurements: Goal: Ability to maintain clinical measurements within normal limits will improve Outcome: Adequate for Discharge Goal: Will remain free from infection Outcome: Adequate for Discharge Goal: Diagnostic test results will improve Outcome: Adequate for Discharge Goal:  Respiratory complications will improve Outcome: Adequate for Discharge Goal: Cardiovascular complication will be avoided Outcome: Adequate for Discharge   Problem: Activity: Goal: Risk for activity intolerance will decrease Outcome: Adequate for Discharge   Problem: Nutrition: Goal: Adequate nutrition will be maintained Outcome: Adequate for Discharge   Problem: Coping: Goal: Level of anxiety will decrease Outcome: Adequate for Discharge   Problem: Elimination: Goal: Will not experience complications related to bowel motility Outcome: Adequate for Discharge Goal: Will not experience complications related to urinary retention Outcome: Adequate for Discharge   Problem: Pain Managment: Goal: General experience of comfort will improve Outcome: Adequate for Discharge   Problem: Safety: Goal: Ability to remain free from injury will improve Outcome: Adequate for Discharge   Problem: Skin Integrity: Goal: Risk for impaired skin integrity will decrease Outcome: Adequate for Discharge   Problem: Acute Rehab PT Goals(only PT should resolve) Goal: Patient Will Transfer Sit To/From Stand Outcome: Adequate for Discharge Goal: Pt Will Ambulate Outcome: Adequate for Discharge Goal: Pt Will Go Up/Down Stairs Outcome: Adequate for Discharge Goal: Pt/caregiver will Perform Home Exercise Program Outcome: Adequate for Discharge

## 2022-12-06 NOTE — Progress Notes (Signed)
Discharge instructions (including medications) discussed with and copy provided to patient/caregiver 

## 2022-12-06 NOTE — Progress Notes (Signed)
Subjective:  Patient ID: Vanessa [name], female    DOB: 1961/06/17,  MRN: [id number]  Patient  POD#2 s/p partial 4th and 5th ray amputations. Relates doing well with minimal pain. Denies n/v/f/c.   Past Medical History:  Diagnosis Date   Asthma    No PFT performed   Blood transfusion without reported diagnosis    has to donate blood due to having "thick" blood.   Complication of anesthesia    Woken up afterwards with panic attacks   COPD (chronic obstructive pulmonary disease) (HCC)    Depression    Diabetes mellitus 2002   GERD (gastroesophageal reflux disease)    Helicobacter pylori (H. pylori) infection    s/p triple therapy   Hepatic hemangioma    History of kidney stones    Hyperlipidemia    Hypertension    Hypertriglyceridemia    Panic attacks    mostly Agaraphobia   Pneumonia    Ventral hernia      Past Surgical History:  Procedure Laterality Date   ABDOMINAL AORTOGRAM W/LOWER EXTREMITY N/A 11/24/2022   Procedure: ABDOMINAL AORTOGRAM W/LOWER EXTREMITY;  Surgeon: Leonie Douglas, MD;  Location: [facility];  Service: Cardiovascular;  Laterality: N/A;   AMPUTATION Right 12/02/2022   Procedure: RAY AMPUTATION OF  RIGHT 5TH & 4TH TOES;  Surgeon: Pilar Plate, DPM;  Location: [facility] OR;  Service: Podiatry;  Laterality: Right;   APPLICATION OF WOUND VAC Right 11/29/2022   Procedure: APPLICATION OF WOUND VAC AND KERECIS MICRO WOUND GRAFT;  Surgeon: Nada Libman, MD;  Location: [facility] OR;  Service: Vascular;  Laterality: Right;   AXILLARY-FEMORAL BYPASS GRAFT Right 11/29/2022   Procedure: RIGHT AXILLA ARTERY - RIGHT FEMORAL ARTERY BYPASS GRAFT USING 8mm X 70cm PROPATEN GORE GRAFT WITH REMOVABLE RINGS;  Surgeon: Nada Libman, MD;  Location: [facility] OR;  Service: Vascular;  Laterality: Right;   CESAREAN SECTION     with 3 children   COLONOSCOPY  2018   OVARIAN CYST REMOVAL     Age 62   ROBOTIC ASSISTED BILATERAL SALPINGO OOPHERECTOMY Bilateral 11/23/2021   Procedure: XI  ROBOTIC ASSISTED BILATERAL SALPINGO OOPHORECTOMY,  MINI LAPAROTOMY;  Surgeon: Carver Fila, MD;  Location: [facility];  Service: Gynecology;  Laterality: Bilateral;   ROBOTIC ASSISTED TOTAL HYSTERECTOMY N/A 11/23/2021   Procedure: XI ROBOTIC ASSISTED TOTAL HYSTERECTOMY, STAGING, CYSTOSCOPY;  Surgeon: Carver Fila, MD;  Location: [facility];  Service: Gynecology;  Laterality: N/A;   TUBAL LIGATION     UPPER GASTROINTESTINAL ENDOSCOPY  2018       Latest Ref Rng & Units 12/06/2022   12:52 AM 12/05/2022    1:33 AM 12/04/2022    1:31 AM  CBC  WBC 4.0 - 10.5 K/uL 14.7  14.8  14.7   Hemoglobin 12.0 - 15.0 g/dL 52.8  41.3  24.4   Hematocrit 36.0 - 46.0 % 40.0  35.6  35.7   Platelets 150 - 400 K/uL 571  511  459        Latest Ref Rng & Units 12/05/2022    1:33 AM 12/04/2022    1:31 AM 12/03/2022    1:49 AM  BMP  Glucose 70 - 99 mg/dL 010  272  536   BUN 8 - 23 mg/dL 8  8  9    Creatinine 0.44 - 1.00 mg/dL 6.44  0.34  7.42   Sodium 135 - 145 mmol/L 135  132  132   Potassium 3.5 - 5.1 mmol/L 4.1  4.1  4.5   Chloride 98 - 111 mmol/L 98  94  94   CO2 22 - 32 mmol/L 29  29  26    Calcium 8.9 - 10.3 mg/dL 8.7  8.4  9.0      Objective:   Vitals:   12/06/22 0720 12/06/22 0805  BP: (!) 147/46   Pulse: 74 74  Resp: 17 18  Temp: 98.2 F (36.8 C)   SpO2: 100%     General:AA&O x 3. Normal mood and affect   Vascular: DP and PT pulses 2/4 bilateral. Brisk capillary refill to all digits. Pedal hair present   Neruological. Epicritic sensation grossly intact.   Derm: Surgical incision well coapted proximally. No signs of dehiscence noted. There is some maceration noted around the incision site. Distal aspect with graft in place.   MSK: MMT 5/5 in dorsiflexion, plantar flexion, inversion and eversion. Normal joint ROM without pain or crepitus.       Assessment & Plan:  Patient was evaluated and treated and all questions answered.  DX: s/p right partial 4th and 5th ray  amputation Dressing to remain clean dry and intact. Will plan to change Wednesday prior to discharge.  Antibiotics: Culture growing rare staph simulans and enterococcus. Recommend one week oral antibitoics upon discharge. Pathology with viable margins. .  WBAT to right foot heel in post op shoe.  S/p ax feb bypass per vascular.  Patient ok for discharge from podiatry standpoint. Will follow-up in one week in our clinic.   Louann Sjogren, DPM  Accessible via secure chat for questions or concerns.

## 2022-12-06 NOTE — Progress Notes (Signed)
Physical Therapy Treatment Patient Details Name: Vanessa [name] MRN: [id number] DOB: 02/03/1961 Today's Date: 12/06/2022   History of Present Illness The pt is a 62 yo female presenting 5/29 with R foot wound. 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 No endovascular revascularization options. Now s/p R axillary to common femoral artery bypass on 6/5. Per chart review, 6/8 R 4th or 5th toe amputation PMH includes: uncontrolled DM II, PAD, COPD, HTN, tobacco use, and IBS.    PT Comments    Pt supine in bed, fully dressed on entry. Reports "waiting on paperwork". Pt reluctantly agreeable to therapy this morning. Pt refuses use of AD despite requiring outside assist to steady in coming to standing due to discrepancy in sole height of sandal and post op shoe. Pt displays poor safety awareness in choice of surfaces to use for steadying reaching out for rolling chair for support with ambulation in hallway. Only agreeable to ambulation of ~30 feet in hallway, returning to room with c/o of increased pain. Provided education on need for use of post op shoe whenever she is bearing weight, use of AD (cane or RW) to provide stability as fall with fracture would decrease her independence. Pt medical team in room at end of session reinforcing PT teaching.    Recommendations for follow up therapy are one component of a multi-disciplinary discharge planning process, led by the attending physician.  Recommendations may be updated based on patient status, additional functional criteria and insurance authorization.     Assistance Recommended at Discharge Intermittent Supervision/Assistance  Patient can return home with the following A little help with walking and/or transfers;A little help with bathing/dressing/bathroom;Assistance with cooking/housework;Assist for transportation;Help with stairs or ramp  for entrance   Equipment Recommendations    Pt has necessary equipment      Precautions / Restrictions Precautions Precautions: Fall Precaution Comments: pt with wound vac, groin incision, RLE gangrenous appearing 4-5th toes Restrictions Weight Bearing Restrictions: Yes RLE Weight Bearing: Weight bearing as tolerated Other Position/Activity Restrictions: RLE black post-op shoe in room WBAT     Mobility  Bed Mobility Overal bed mobility: Needs Assistance Bed Mobility: Supine to Sit     Supine to sit: Supervision     General bed mobility comments: supervision for safety, able to come to    Transfers Overall transfer level: Needs assistance   Transfers: Sit to/from Stand Sit to Stand: Min assist           General transfer comment: pt with good power up but decreased ability to steady herself in standing requiring min A for steadying due to imbalance of post op shoe, pt refuses use of AD    Ambulation/Gait Ambulation/Gait assistance: Min assist Gait Distance (Feet): 40 Feet Assistive device: None Gait Pattern/deviations: Step-to pattern, Decreased stride length, Decreased weight shift to right, Decreased stance time - right, Decreased step length - left, Shuffle, Antalgic, Staggering left, Staggering right, Scissoring Gait velocity: decreased Gait velocity interpretation: <1.31 ft/sec, indicative of household ambulator   General Gait Details: min A for steadying, pt refusing any AD, despite poor balance with gait, PT acknowledges frustration with post op shoe, however educates on poor outcomes possible with fall or not using post op shoe at home.       Balance Overall balance assessment: Needs assistance Sitting-balance support: No upper extremity supported, Feet supported Sitting balance-Leahy Scale: Good Sitting balance -  Comments: pt defers today   Standing balance support: Single extremity supported, During functional activity Standing balance-Leahy Scale:  Poor Standing balance comment: would benefit from UE support with dynamic balance                            Cognition Arousal/Alertness: Awake/alert Behavior During Therapy: WFL for tasks assessed/performed Overall Cognitive Status: Within Functional Limits for tasks assessed                                 General Comments: continues to have very poor safety awareness of her balance with use of post op shoe, educated on need to maintain post op shoe at discharge as well as using an AD to improve balance with mobilization.           General Comments General comments (skin integrity, edema, etc.): VSS on RA      Pertinent Vitals/Pain Pain Assessment Pain Assessment: Faces Faces Pain Scale: Hurts even more Pain Location: R foot Pain Descriptors / Indicators: Throbbing, Grimacing, Guarding Pain Intervention(s): Limited activity within patient's tolerance, Monitored during session, Repositioned     PT Goals (current goals can now be found in the care plan section) Acute Rehab PT Goals Patient Stated Goal: return home PT Goal Formulation: With patient Time For Goal Achievement: 12/14/22 Potential to Achieve Goals: Good Progress towards PT goals: Not progressing toward goals - comment    Frequency    Min 1X/week      PT Plan Current plan remains appropriate       AM-PAC PT "6 Clicks" Mobility   Outcome Measure  Help needed turning from your back to your side while in a flat bed without using bedrails?: None Help needed moving from lying on your back to sitting on the side of a flat bed without using bedrails?: None Help needed moving to and from a bed to a chair (including a wheelchair)?: A Little Help needed standing up from a chair using your arms (e.g., wheelchair or bedside chair)?: A Little Help needed to walk in hospital room?: A Little Help needed climbing 3-5 steps with a railing? : Total 6 Click Score: 18    End of Session  Equipment Utilized During Treatment:  (refuses gait belt) Activity Tolerance: Patient tolerated treatment well Patient left: in bed;with call bell/phone within reach (Physicians in room) Nurse Communication: Mobility status PT Visit Diagnosis: Other abnormalities of gait and mobility (R26.89);Muscle weakness (generalized) (M62.81)     Time: 9147-8295 PT Time Calculation (min) (ACUTE ONLY): 10 min  Charges:  $Gait Training: 8-22 mins                     Humphrey Guerreiro B. Beverely Risen PT, DPT Acute Rehabilitation Services Please use secure chat or  Call Office [phone]    [facility] 12/06/2022, 9:23 AM

## 2022-12-06 NOTE — TOC Transition Note (Signed)
Transition of Care (TOC) - CM/SW Discharge Note Donn Pierini RN, BSN Transitions of Care Unit 4E- RN Case Manager See Treatment Team for direct phone #   Patient Details  Name: Vanessa [name] MRN: [id number] Date of Birth: 05-Apr-1961  Transition of Care [facility]) CM/SW Contact:  Darrold Span, RN Phone Number: 12/06/2022, 10:13 AM   Clinical Narrative:    Pt stable for transition home today, family at bedside and to transport home.   Order placed for HHPT, CM spoke with pt at bedside- pt voiced she does not need HH at this time and has declined referral/services. Explained to pt should she change her mind on return home she can contact her PCP to get order/referral and list provided for [facility] choice Per CMS guidelines from [email or web address] website with star ratings (copy placed in shadow chart)- pt voiced understanding.   Pt states she has RW- and no other DME needs.   Family requesting to see if attending team would be willing to sign some FMLA paperwork- bedside RN to reach out and ask. If not they will need to f/u with patient's PCP.   Meds to be filled at [facility] pharmacy prior to discharge.   No further TOC Needs noted.    Final next level of care: Home/Self Care Barriers to Discharge: Barriers Resolved   Patient Goals and CMS Choice CMS Medicare.gov Compare Post Acute Care list provided to:: Patient Choice offered to / list presented to : Patient  Discharge Placement               Home          Discharge Plan and Services Additional resources added to the After Visit Summary for     Discharge Planning Services: CM Consult Post Acute Care Choice: Durable Medical Equipment, Home Health          DME Arranged: N/A DME Agency: NA       HH Arranged: PT, Patient Refused HH HH Agency: NA        Social Determinants of Health (SDOH) Interventions SDOH Screenings   Food Insecurity: No Food Insecurity (11/22/2022)  Housing: Low Risk  (11/22/2022)   Transportation Needs: No Transportation Needs (11/22/2022)  Utilities: Not At Risk (11/22/2022)  Depression (PHQ2-9): High Risk (10/25/2022)  Social Connections: Moderately Isolated (10/25/2022)  Tobacco Use: High Risk (12/04/2022)     Readmission Risk Interventions    12/06/2022   10:13 AM 11/23/2022   10:35 AM 11/22/2022    4:12 PM  Readmission Risk Prevention Plan  Transportation Screening Complete Complete Complete  PCP or Specialist Appt within 5-7 Days  Complete Complete  Home Care Screening  Complete Complete  Medication Review (RN CM)  Complete Complete  HRI or Home Care Consult Complete    Social Work Consult for Recovery Care Planning/Counseling Complete    Palliative Care Screening Not Applicable    Medication Review Oceanographer) Complete

## 2022-12-06 NOTE — Discharge Summary (Signed)
Name: Vanessa [name] MRN: [id number] DOB: [birth year] 62 y.o. PCP: Evlyn Kanner, MD  Date of Admission: 11/22/2022 10:23 AM Date of Discharge: 12/06/2022 Attending Physician: Dr. Criselda Peaches  Discharge Diagnosis: Principal Problem:   Osteomyelitis of fifth toe of right foot [facility]) Active Problems:   Gangrene of toe of right foot (HCC)   Osteomyelitis of fourth toe of right foot [facility])    Discharge Medications: Allergies as of 12/06/2022       Reactions   Jardiance [empagliflozin] Other (See Comments)   Severe yeast infection   Latex Rash   Avandia [rosiglitazone] Other (See Comments)   Headaches        Medication List     STOP taking these medications    budesonide-formoterol 80-4.5 MCG/ACT inhaler Commonly known as: Symbicort   Spiriva HandiHaler 18 MCG inhalation capsule Generic drug: tiotropium       TAKE these medications    acetaminophen 500 MG tablet Commonly known as: TYLENOL Take 1,000 mg by mouth 2 (two) times daily as needed for mild pain or moderate pain.   albuterol 108 (90 Base) MCG/ACT inhaler Commonly known as: VENTOLIN HFA INHALE 2 PUFFS BY MOUTH EVERY 6 HOURS AS NEEDED FOR WHEEZING OR  SHORTNESS  OF  BREATH What changed: See the new instructions.   amoxicillin-clavulanate 875-125 MG tablet Commonly known as: AUGMENTIN Take 1 tablet by mouth every 12 (twelve) hours. What changed: when to take this   aspirin EC 81 MG tablet Take 1 tablet (81 mg total) by mouth daily at 6 (six) AM. Swallow whole.   atorvastatin 80 MG tablet Commonly known as: LIPITOR Take 1 tablet (80 mg total) by mouth daily.   buPROPion 150 MG 12 hr tablet Commonly known as: WELLBUTRIN SR Take 1 tablet (150 mg total) by mouth 2 (two) times daily.   cetirizine 10 MG tablet Commonly known as: ZYRTEC Take 1 tablet (10 mg total) by mouth daily.   cimetidine 200 MG tablet Commonly known as: TAGAMET Take 200 mg by mouth daily.   citalopram 40 MG tablet Commonly known  as: CELEXA Take 1 tablet (40 mg total) by mouth daily.   doxycycline 100 MG tablet Commonly known as: VIBRA-TABS Take 1 tablet (100 mg total) by mouth every 12 (twelve) hours for 4 days.   ezetimibe 10 MG tablet Commonly known as: ZETIA Take 1 tablet (10 mg total) by mouth daily.   glucose blood test strip Use as instructed   lisinopril 5 MG tablet Commonly known as: ZESTRIL Take 1 tablet (5 mg total) by mouth daily. What changed:  medication strength how much to take   nicotine 21 mg/24hr patch Commonly known as: NICODERM CQ - dosed in mg/24 hours Place 1 patch (21 mg total) onto the skin daily as needed (tobacco cravings).   oxyCODONE 5 MG immediate release tablet Commonly known as: Oxy IR/ROXICODONE Take 1 tablet (5 mg total) by mouth every 4 (four) hours as needed for up to 3 days for moderate pain.   Pen Needles 31G X 5 MM Misc 1 each by Does not apply route daily. Use to inject insulin   Soliqua 100-33 UNT-MCG/ML Sopn Generic drug: Insulin Glargine-Lixisenatide Inject 10 Units into the skin daily. What changed: how much to take   Trelegy Ellipta 200-62.5-25 MCG/ACT Aepb Generic drug: Fluticasone-Umeclidin-Vilant Inhale 1 puff into the lungs daily.               Discharge Care Instructions  (From admission, onward)  Start     Ordered   12/06/22 0000  Leave dressing on - Keep it clean, dry, and intact until clinic visit        12/06/22 0925            Disposition and follow-up:   Vanessa [name] was discharged from [facility] in Good condition.  At the hospital follow up visit please address:  1.  Follow-up:  a.  Ensure good healing after fourth and fifth ray amputation on the right foot.  Ensure that she is also getting good follow-up with podiatry and vascular for her wound care.    b.  Recheck blood pressure.  Lisinopril dose was decreased from 10 mg daily to 5 mg daily during admission.   c.  Advise patient on  up titration of Soliqua.  Prior to admission she was taking 30 units daily.  During admission she had some low blood sugars and lower insulin requirements.  She was discharged on 10 units of Soliqua daily.   d.  2.  Labs / imaging needed at time of follow-up: None  3.  Pending labs/ test needing follow-up: Wound culture Prevotella sensitivities  Follow-up Appointments:  Follow-up Information     [facility] Follow up in 3 week(s).   Specialty: Vascular Surgery Why: sent Contact information: [address] [place] [address] [phone]        Rocky Morel, DO Follow up on 12/14/2022.   Why: at 8:45 AM, please arrive 15-30 minutes early Contact information: [address] [place] [address] [phone]                 Hospital Course by problem list: Vanessa [name] is a 62 y.o. female with pertinent PMH of T2DM, COPD, HTN, and IBS who presents with progressively worsening ulcer on her right foot and is admitted for right foot osteomyelitis complicated by severe peripheral artery disease.  Osteomyelitis of the right foot fourth phalanx Cellulitis of the right foot Nonhealing plantar ulcer on the right foot Peripheral arterial disease Uncontrolled diabetes with an A1c of 9.8 and history of claudication symptoms for over a year and lower extremities. MRI compatible with osteomyelitis in the proximal phalanx of the fourth toe.  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 VVS unable to study RLE due to iliac occlusion and obesity.  CTA abdomen/pelvis with runoff shows right common iliac artery occlusion with reconstitution of the proximal common femoral artery and diffuse SFA disease, otherwise nonocclusive stenosis in the infrarenal abdominal aorta, left common iliac artery, left SFA,  and left popliteal artery.  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  Surgical cultures showed rare Staph simulans, E faecalis, and Prevotella with clear bone margins.  These would all be covered by Augmentin and doxycycline which she was transitioned to after being on ceftriaxone.  Blood cultures showed no growth through 5 days.  She remained afebrile, hemodynamically stable, and with stable leukocytosis throughout her admission.  On the day of discharge she was on day 15 of antibiotics.  She will complete a 7 day post amputation course of antibiotics on 12/09/2022.  She will follow-up with vascular and podiatry outpatient.  HFmrEF During admission she had a TTE for cardiac clearance in preparation for  vascular surgery.  This showed LVEF of 50 to 55%.  She was on maintenance fluids for couple days after surgery and developed some pulmonary edema.  She required 4-5 L of supplemental oxygen through nasal cannula for a few days but responded well to IV Lasix and was stable for several days prior to discharge without the need for supplemental oxygen.  Does not need outpatient diuretic at this time.  T2DM A1c 10/25/2022 was 10.6 and 9.8 here.  She is on Soliqua 30 units nightly at home which she was recently started on in our clinic.  She does want a Dexcom.  She did not require nearly as much insulin during her inpatient stay but she was intermittently n.p.o. and likely had a different diet than at home.  She was discharged on 10 units of Soliqua nightly with plans to titrate up.   COPD Continued home inhaler equivalents with Breo Ellipta, Incruse Ellipta, and albuterol.  Will optimize with Trelegy on discharge.   Hypertension Predominantly normotensive here and is on lisinopril 10 mg daily at home.  She did have some hypotension after vascular surgery and lisinopril was held for few days and then restarted at 5 mg daily.  Plan to  increase this in the outpatient setting if blood pressure is elevated.   Anxiety/depression Tobacco use disorder Unfortunately she had started smoking again after quitting several months ago.  She was counseled on the impact this has on her peripheral artery disease and her healing.  We continued home medications of bupropion 150 mg twice daily, citalopram 40 mg daily, and nicotine patch daily as needed.   HLD Repeat lipid panel showed LDL of 129. Continued home atorvastatin 80 mg daily and added ezetimibe 10 mg daily   Discharge Subjective: Doing well this morning with her postop shoe on and is ready to go home.  Discharge Exam:   BP (!) 162/58 (BP Location: Left Arm)   Pulse 78   Temp 98.2 F (36.8 C) (Oral)   Resp 20   Ht 5\' 2"  (1.575 m)   Wt 68.5 kg   LMP 12/24/2013   SpO2 100%   BMI 27.62 kg/m  Constitutional: Well appearing female. In no acute distress. MSK/skin: Pitting edema in the right lower extremity up to the knee.  Ace wrapped right foot and ankle without evidence of soiling.    Pertinent Labs, Studies, and Procedures:     Latest Ref Rng & Units 12/06/2022   12:52 AM 12/05/2022    1:33 AM 12/04/2022    1:31 AM  CBC  WBC 4.0 - 10.5 K/uL 14.7  14.8  14.7   Hemoglobin 12.0 - 15.0 g/dL 16.1  09.6  04.5   Hematocrit 36.0 - 46.0 % 40.0  35.6  35.7   Platelets 150 - 400 K/uL 571  511  459        Latest Ref Rng & Units 12/05/2022    1:33 AM 12/04/2022    1:31 AM 12/03/2022    1:49 AM  CMP  Glucose 70 - 99 mg/dL 409  811  914   BUN 8 - 23 mg/dL 8  8  9    Creatinine 0.44 - 1.00 mg/dL 7.82  9.56  2.13   Sodium 135 - 145 mmol/L 135  132  132   Potassium 3.5 - 5.1 mmol/L 4.1  4.1  4.5   Chloride 98 - 111 mmol/L 98  94  94   CO2 22 - 32 mmol/L 29  29  26  Calcium 8.9 - 10.3 mg/dL 8.7  8.4  9.0     VAS [place] ABI WITH/WO TBI  Result Date: 11/23/2022  LOWER EXTREMITY DOPPLER STUDY Patient Name:  Vanessa [name]  Date of Exam:   11/23/2022 Medical Rec #: [id number]    Accession  #:    [id number] Date of Birth: [birth year]    Patient Gender: F Patient Age:   44 years Exam Location:  [facility] Procedure:      VAS [place] ABI WITH/WO TBI Referring Phys: Coral Else --------------------------------------------------------------------------------  Indications: Peripheral artery disease. RT foot wound w/ osteomyelitis. High Risk Factors: Hypertension, hyperlipidemia, Diabetes, current smoker.  Comparison Study: No prior studies. Performing Technologist: Jean Rosenthal RDMS RVT  Examination Guidelines: A complete evaluation includes at minimum, Doppler waveform signals and systolic blood pressure reading at the level of bilateral brachial, anterior tibial, and posterior tibial arteries, when vessel segments are accessible. Bilateral testing is considered an integral part of a complete examination. Photoelectric Plethysmograph (PPG) waveforms and toe systolic pressure readings are included as required and additional duplex testing as needed. Limited examinations for reoccurring indications may be performed as noted.  ABI Findings: +---------+------------------+-----+-----------------------+--------+ Right    Rt Pressure (mmHg)IndexWaveform               Comment  +---------+------------------+-----+-----------------------+--------+ Brachial 117                                                    +---------+------------------+-----+-----------------------+--------+ PTA      0                 0.00 unable to obtain signal         +---------+------------------+-----+-----------------------+--------+ DP       0                 0.00 unable to obtain signal         +---------+------------------+-----+-----------------------+--------+ Great Toe0                 0.00 Absent                          +---------+------------------+-----+-----------------------+--------+ +---------+-----------------+-----+----------+---------------------------------+ Left     Lt Pressure       IndexWaveform  Comment                                    (mmHg)                                                            +---------+-----------------+-----+----------+---------------------------------+ Brachial                                 Unable to obtain pressures-                                                fistula                           +---------+-----------------+-----+----------+---------------------------------+  PTA      48               0.41 monophasic                                  +---------+-----------------+-----+----------+---------------------------------+ DP       0                0.00 absent                                      +---------+-----------------+-----+----------+---------------------------------+ Great Toe41               0.35 Abnormal                                    +---------+-----------------+-----+----------+---------------------------------+  Summary: Right: Resting right ankle-brachial index indicates critical limb ischemia. The right toe-brachial index is absent. Left: Resting left ankle-brachial index indicates severe left lower extremity arterial disease. The left toe-brachial index is abnormal. *See table(s) above for measurements and observations.  Electronically signed by Heath Lark on 11/23/2022 at 5:48:09 PM.    Final    MR FOOT RIGHT WO CONTRAST  Result Date: 11/22/2022 CLINICAL DATA:  Soft tissue infection EXAM: MRI OF THE RIGHT FOREFOOT WITHOUT CONTRAST TECHNIQUE: Multiplanar, multisequence MR imaging of the right forefoot was performed. The patient refused IV contrast. COMPARISON:  Foot radiographs 11/21/2022 FINDINGS: Bones/Joint/Cartilage Abnormal marrow edema in the proximal phalanx of the fourth toe, especially in the proximal half of the bone as shown on image 9 series 8, without visible fracture. In the setting of concern for infection of the foot, the appearance is suspicious for osteomyelitis. There  is potentially some trace endosteal edema along the lateral margin of the distal head of the fourth metatarsal on image 13 series 6 but no compelling findings osteomyelitis involving the metatarsal, this endosteal edema could well be reactive. No other findings suspicious for osteomyelitis in the forefoot. Ligaments Lisfranc ligament intact.  No plantar plate lesion observed. Muscles and Tendons Mild regional atrophy. Soft tissues There is some localized subcutaneous edema below the fourth MTP joint and in the soft tissues of the proximal fourth toe, cellulitis is a distinct possibility. No foreign body is observed. Mild intermetatarsal bursitis between the heads of the first and second metatarsals. IMPRESSION: 1. Abnormal marrow edema in the proximal phalanx of the fourth toe, compatible with osteomyelitis. There is also some localized subcutaneous edema below the fourth MTP joint and in the soft tissues of the proximal fourth toe, cellulitis is a distinct possibility. 2. Mild intermetatarsal bursitis between the heads of the first and second metatarsals. Electronically Signed   By: Gaylyn Rong M.D.   On: 11/22/2022 18:50   DG Chest Port 1 View  Result Date: 11/22/2022 CLINICAL DATA:  Questionable sepsis - evaluate for abnormality EXAM: PORTABLE CHEST 1 VIEW COMPARISON:  CXR 08/05/22 FINDINGS: Hazy bibasilar airspace opacities could represent atelectasis or infection. Unchanged cardiac and mediastinal contours. No radiographically apparent displaced rib fractures. Visualized upper abdomen is unremarkable. Likely small bilateral pleural effusions. No pneumothorax IMPRESSION: 1. Hazy bibasilar airspace opacities could represent atelectasis or infection. 2. Likely small bilateral pleural effusions. Electronically Signed   By: Lorenza Cambridge M.D.   On: 11/22/2022 12:45  Discharge Instructions: Discharge Instructions     Call MD for:  difficulty breathing, headache or visual disturbances   Complete by:  As directed    Call MD for:  extreme fatigue   Complete by: As directed    Call MD for:  persistant dizziness or light-headedness   Complete by: As directed    Call MD for:  persistant nausea and vomiting   Complete by: As directed    Call MD for:  redness, tenderness, or signs of infection (pain, swelling, redness, odor or green/yellow discharge around incision site)   Complete by: As directed    Call MD for:  severe uncontrolled pain   Complete by: As directed    Call MD for:  temperature >100.4   Complete by: As directed    Diet - low sodium heart healthy   Complete by: As directed    Increase activity slowly   Complete by: As directed    Leave dressing on - Keep it clean, dry, and intact until clinic visit   Complete by: As directed        Signed: Rocky Morel, DO 12/06/2022, 1:44 PM   Pager: [phone]

## 2022-12-06 NOTE — Progress Notes (Addendum)
  Progress Note    12/06/2022 8:31 AM 4 Days Post-Op  Subjective:  no complaints.  Ready for d/c home   Vitals:   12/06/22 0720 12/06/22 0805  BP: (!) 147/46   Pulse: 74 74  Resp: 17 18  Temp: 98.2 F (36.8 C)   SpO2: 100%    Physical Exam: Lungs:  non labored Incisions:  chest and groin incisions are healing well Extremities:  brisk R PT by doppler Neurologic: A&O  CBC    Component Value Date/Time   WBC 14.7 (H) 12/06/2022 0052   RBC 4.35 12/06/2022 0052   HGB 12.6 12/06/2022 0052   HGB 17.0 (H) 09/21/2021 1543   HCT 40.0 12/06/2022 0052   HCT 49.4 (H) 09/21/2021 1543   PLT 571 (H) 12/06/2022 0052   PLT 464 (H) 09/21/2021 1543   MCV 92.0 12/06/2022 0052   MCV 88 09/21/2021 1543   MCH 29.0 12/06/2022 0052   MCHC 31.5 12/06/2022 0052   RDW 14.2 12/06/2022 0052   RDW 12.1 09/21/2021 1543   LYMPHSABS 3.2 12/06/2022 0052   LYMPHSABS 3.1 09/21/2021 1543   MONOABS 1.0 12/06/2022 0052   EOSABS 1.0 (H) 12/06/2022 0052   EOSABS 0.4 09/21/2021 1543   BASOSABS 0.1 12/06/2022 0052   BASOSABS 0.1 09/21/2021 1543    BMET    Component Value Date/Time   NA 135 12/05/2022 0133   NA 142 09/21/2021 1543   K 4.1 12/05/2022 0133   CL 98 12/05/2022 0133   CO2 29 12/05/2022 0133   GLUCOSE 263 (H) 12/05/2022 0133   BUN 8 12/05/2022 0133   BUN 8 09/21/2021 1543   CREATININE 0.59 12/05/2022 0133   CREATININE 0.56 11/04/2021 1019   CREATININE 0.45 (L) 12/03/2014 1542   CALCIUM 8.7 (L) 12/05/2022 0133   GFRNONAA >60 12/05/2022 0133   GFRNONAA >60 11/04/2021 1019   GFRNONAA >89 12/03/2014 1542   GFRAA 125 03/11/2020 1644   GFRAA >89 12/03/2014 1542    INR    Component Value Date/Time   INR 1.0 11/22/2022 1142     Intake/Output Summary (Last 24 hours) at 12/06/2022 0831 Last data filed at 12/05/2022 2025 Gross per 24 hour  Intake 529.86 ml  Output --  Net 529.86 ml     Assessment/Plan:  62 y.o. female is s/p R ax-fem bypass 4 Days Post-Op   R foot well  perfused based on doppler exam R groin incisional vac removed yesterday; incisions are well appearing; encouraged her to keep these clean and dry Office will call to arrange follow up in 2-3 weeks   [name], PA-C [facility] [phone] 12/06/2022 8:31 AM   I agree with the above.  Have seen and evaluated patient.  She is stable for discharge from my perspective  Wells Zeddie Njie

## 2022-12-07 ENCOUNTER — Telehealth: Payer: Self-pay

## 2022-12-07 NOTE — Telephone Encounter (Signed)
Called pt no answer-LVM for pt to call back regarding FMLA paperwork. FMLA paperwork has been completed and faxed. Original copy will be placed a the front desk for pt to pick up.

## 2022-12-07 NOTE — Transitions of Care (Post Inpatient/ED Visit) (Signed)
   12/07/2022  Name: Vanessa [name] MRN: [id number] DOB: 01/01/1961  Today's TOC FU Call Status: Today's TOC FU Call Status:: Unsuccessul Call (1st Attempt) Unsuccessful Call (1st Attempt) Date: 12/07/22  Attempted to reach the patient regarding the most recent Inpatient/ED visit.  Follow Up Plan: Additional outreach attempts will be made to reach the patient to complete the Transitions of Care (Post Inpatient/ED visit) call.   Signature Karena Addison, LPN [facility] Nurse Health Advisor Direct Dial [phone]

## 2022-12-08 ENCOUNTER — Telehealth: Payer: Self-pay | Admitting: Physician Assistant

## 2022-12-08 NOTE — Telephone Encounter (Signed)
-----   Message from [name], PA-C sent at 12/05/2022  7:52 AM EDT -----  3 weeks for incision check with PA or [facility].  PO R ax-fem bypass Thanks

## 2022-12-08 NOTE — Telephone Encounter (Signed)
Appt has been scheduled.

## 2022-12-11 NOTE — Transitions of Care (Post Inpatient/ED Visit) (Signed)
   12/11/2022  Name: Vanessa [name] MRN: [id number] DOB: May 31, 1961  Today's TOC FU Call Status: Today's TOC FU Call Status:: Unsuccessful Call (2nd Attempt) Unsuccessful Call (1st Attempt) Date: 12/07/22 Unsuccessful Call (2nd Attempt) Date: 12/11/22  Attempted to reach the patient regarding the most recent Inpatient/ED visit.  Follow Up Plan: Additional outreach attempts will be made to reach the patient to complete the Transitions of Care (Post Inpatient/ED visit) call.   Signature Karena Addison, LPN [facility] Nurse Health Advisor Direct Dial [phone]

## 2022-12-13 NOTE — Transitions of Care (Post Inpatient/ED Visit) (Signed)
   12/13/2022  Name: CARALENA HASCH MRN: [id number] DOB: Aug 31, 1960  Today's TOC FU Call Status: Today's TOC FU Call Status:: Unsuccessful Call (3rd Attempt) Unsuccessful Call (1st Attempt) Date: 12/07/22 Unsuccessful Call (2nd Attempt) Date: 12/11/22 Unsuccessful Call (3rd Attempt) Date: 12/13/22  Attempted to reach the patient regarding the most recent Inpatient/ED visit.  Follow Up Plan: No further outreach attempts will be made at this time. We have been unable to contact the patient.  Signature Karena Addison, LPN [facility] Nurse Health Advisor Direct Dial [phone]

## 2022-12-14 ENCOUNTER — Telehealth: Payer: Self-pay

## 2022-12-14 ENCOUNTER — Encounter: Payer: Self-pay | Admitting: Student

## 2022-12-14 ENCOUNTER — Ambulatory Visit (INDEPENDENT_AMBULATORY_CARE_PROVIDER_SITE_OTHER): Payer: 59 | Admitting: Student

## 2022-12-14 VITALS — BP 123/44 | HR 65 | Temp 98.5°F | Ht 62.0 in | Wt 147.9 lb

## 2022-12-14 DIAGNOSIS — I110 Hypertensive heart disease with heart failure: Secondary | ICD-10-CM

## 2022-12-14 DIAGNOSIS — M86171 Other acute osteomyelitis, right ankle and foot: Secondary | ICD-10-CM

## 2022-12-14 DIAGNOSIS — E1169 Type 2 diabetes mellitus with other specified complication: Secondary | ICD-10-CM

## 2022-12-14 DIAGNOSIS — I5022 Chronic systolic (congestive) heart failure: Secondary | ICD-10-CM | POA: Diagnosis not present

## 2022-12-14 DIAGNOSIS — Z794 Long term (current) use of insulin: Secondary | ICD-10-CM

## 2022-12-14 DIAGNOSIS — F17200 Nicotine dependence, unspecified, uncomplicated: Secondary | ICD-10-CM

## 2022-12-14 DIAGNOSIS — E119 Type 2 diabetes mellitus without complications: Secondary | ICD-10-CM

## 2022-12-14 DIAGNOSIS — M869 Osteomyelitis, unspecified: Secondary | ICD-10-CM

## 2022-12-14 DIAGNOSIS — F17210 Nicotine dependence, cigarettes, uncomplicated: Secondary | ICD-10-CM

## 2022-12-14 DIAGNOSIS — E1151 Type 2 diabetes mellitus with diabetic peripheral angiopathy without gangrene: Secondary | ICD-10-CM | POA: Diagnosis not present

## 2022-12-14 DIAGNOSIS — Z9889 Other specified postprocedural states: Secondary | ICD-10-CM

## 2022-12-14 DIAGNOSIS — I739 Peripheral vascular disease, unspecified: Secondary | ICD-10-CM

## 2022-12-14 DIAGNOSIS — J441 Chronic obstructive pulmonary disease with (acute) exacerbation: Secondary | ICD-10-CM | POA: Diagnosis not present

## 2022-12-14 DIAGNOSIS — E785 Hyperlipidemia, unspecified: Secondary | ICD-10-CM

## 2022-12-14 DIAGNOSIS — I10 Essential (primary) hypertension: Secondary | ICD-10-CM

## 2022-12-14 NOTE — Progress Notes (Unsigned)
CC: hospital follow up  HPI:  Vanessa [name] is a 62 y.o. female with PMH as below who presents to the clinic for hospital follow-up at [facility] from 11/22/2022 - 12/06/2022 for nonhealing right foot wound underlying the fourth and fifth digit with associated gangrene and evidence of osteomyelitis of the fourth metatarsal on imaging.  Underwent right 4/5th ray amputation on 12/02/2022.  Please see assessment and plan for further details.  Past Medical History:  Diagnosis Date   Asthma    No PFT performed   Blood transfusion without reported diagnosis    has to donate blood due to having "thick" blood.   Complication of anesthesia    Woken up afterwards with panic attacks   COPD (chronic obstructive pulmonary disease) (HCC)    Depression    Diabetes mellitus 2002   GERD (gastroesophageal reflux disease)    Helicobacter pylori (H. pylori) infection    s/p triple therapy   Hepatic hemangioma    History of kidney stones    Hyperlipidemia    Hypertension    Hypertriglyceridemia    Panic attacks    mostly Agaraphobia   Pneumonia    Ventral hernia    Review of Systems:   Pertinent items noted in HPI and/or A&P.  Physical Exam:  Vitals:   12/14/22 0854  BP: (!) 123/44  Pulse: 65  Temp: 98.5 F (36.9 C)  TempSrc: Oral  Weight: 147 lb 14.4 oz (67.1 kg)  Height: 5\' 2"  (1.575 m)    Constitutional: Well-appearing middle-aged female. In no acute distress. Cardio:Regular rate and rhythm. 2+ bilateral radial pulses. Pulm:Normal work of breathing on room air. MSK: Pitting edema in the right lower extremity up to the knee.  Gauze and Ace wrap right foot to the ankle without any associated erythema proximally. Skin:Warm and dry. Palpable arterial graft material in the right subclavian incision, right flank, and right groin without associated edema, bruising, or tenderness. Neuro:Alert and oriented x3. No focal deficit noted. Psych:Pleasant mood and affect.   Assessment & Plan:    Essential hypertension, benign Blood pressure stable from recent hospitalization at 123/44 after decreasing lisinopril from 10 to 5 mg daily.  - continue lisinopril 5 mg daily  COPD with acute exacerbation (HCC) Doing well after optimizing to Trelegy Ellipta. Still no known PFTs on file. Previously limited by insurance coverage. Discuss PFTs at next follow up.  - continue trelegy ellipta and prn albuterol  Heart failure with mildly reduced ejection fraction (HFmrEF) (HCC) Pre-op TTE for axillary femoral bypass 11/2022 showed LVEF 50-55% with no diastolic dysfunction. After prolonged maintenance fluids she had oxygen requirements up to 9 L nasal cannula and responded well to IV diuresis.  Did not require any more oxygen during or after hospitalization.  No significant symptoms after discharge.  GDMT is limited by blood pressure, recurrent UTI on SGLT2 inhibitor.  She is on low-dose lisinopril.  No known ischemic history or prior ischemic eval.  Will follow-up next visit and add GDMT as able. - Continue lisinopril 5 mg daily - consider BB addition and cardiology referral at follow up   Diabetes mellitus, type II, insulin dependent (HCC) At her last clinic visit she was Soliqua 30 units nightly.  During recent hospitalization she required much less insulin due to frequent n.p.o. status.  She was discharged with instructions to use 10 units of Soliqua nightly but has been using 30 units of Soliqua nightly.  Lowest fasting blood sugar was this morning at 97 and  usually has been in the 120s since discharge.  A1c this month was 9.8.  No reported lows. - Continue Soliqua 30 units nightly, return in 3 months for repeat A1c  Tobacco dependence She has not been smoking since hospital discharge this month.  Has nicotine patches but does not use them.  She is very motivated to continue with tobacco cessation after need for axillary femoral bypass in 2 amputation due to severe arterial disease.  She continues  on Wellbutrin with benefit. - Continue Wellbutrin 150 mg BID, nicotine patches as needed  HLD (hyperlipidemia) LDL during hospitalization was 70 while on atorvastatin 80 mg daily.  Ezetimibe 10 mg daily was added.  No significant side effects since starting this medication. - Continue atorvastatin 80 mg daily and ezetimibe 10 mg daily - Repeat lipid panel at next follow-up  Osteomyelitis of fifth toe of right foot [facility]) Recently hospitalized at [facility] from 11/22/2022 - 12/06/2022 for nonhealing right foot wound underlying the fourth and fifth digit with associated gangrene and evidence of osteomyelitis of the fourth metatarsal on imaging.  Complicated by severe PAD with significant aortic and bilateral lower extremity disease requiring axillary femoral bypass prior to 4/5th ray amputation on the right foot on 12/02/2022.  Surgical cultures grew Staphylococcus stimulants, Enterococcus faecalis, and Prevotella melaninogenica.  Sensitivities showed staph was methicillin, clindamycin, and erythromycin resistant.  Prior to surgery she was on ceftriaxone and then completed 7 days of Augmentin and doxycycline after amputation.  Wound here is well wrapped with some continued mild right lower extremity pitting edema to the knee.  She will follow-up with podiatry 12/15/2022 and vascular surgery on 01/01/2023.  Peripheral arterial disease with history of revascularization [facility]) Recently hospitalized at [facility] from 11/22/2022 - 12/06/2022 for nonhealing right foot wound underlying the fourth and fifth digit with associated gangrene and evidence of osteomyelitis of the fourth metatarsal on imaging.  Found to have extensive aortic and bilateral lower extremity occlusive disease without good option for local bypass.  Underwent right axillary femoral bypass on 11/29/2022 prior to right 4/5th ray amputation on 12/02/2022.  She has done well since the surgeries and even thinks the blood flow to her left lower extremity has  improved.  No significant claudication since procedure.  Unfortunately she had resumed smoking prior to hospitalization but since then has not restarted.  She will follow-up with vascular on 01/01/2023 - Continues on aspirin 81 mg daily, atorvastatin 80 mg daily, and ezetimibe 10 mg daily.    Patient discussed with Dr. Julieanne Cotton, DO [facility] Internal Medicine Resident PGY-1 Pager: [phone]

## 2022-12-14 NOTE — Transitions of Care (Post Inpatient/ED Visit) (Signed)
   12/14/2022  Name: Vanessa [name] MRN: [id number] DOB: [birth year]  Today's TOC FU Call Status: Today's TOC FU Call Status:: Unsuccessful Call (3rd Attempt) Unsuccessful Call (3rd Attempt) Date: 12/14/22  Attempted to reach the patient regarding the most recent Inpatient/ED visit.  Follow Up Plan: No further outreach attempts will be made at this time. We have been unable to contact the patient.  Signature  TB,CMA

## 2022-12-14 NOTE — Patient Instructions (Signed)
  Thank you, Ms.Izell [place], for allowing [place] to provide your care today. Today we discussed . . .  > Hospital follow-up for surgeries       -You are doing really well after leaving the hospital.  I am glad that you have good scheduled follow-ups with podiatry and vascular.  Please let us know if you have any issues in between these but continue to weight-bear with your surgical shoe as instructed.  You are also doing a great job on your smoking cessation and I would recommend that you consider the nicotine patches if you are having strong cravings. > Diabetes       -Your blood sugars are doing very well at home on the 30 units of Soliqua.  I recommend that you continue to take it every night.  If you do have any low blood sugars please call our clinic and we will make adjustments.  We will recheck your A1c in about 3 months.   I have ordered the following labs for you:  Lab Orders  No laboratory test(s) ordered today      Referrals ordered today:   Referral Orders  No referral(s) requested today      I have ordered the following medication/changed the following medications:   Stop the following medications: There are no discontinued medications.   Start the following medications: No orders of the defined types were placed in this encounter.     Follow up: 3 months    Remember:     Should you have any questions or concerns please call the internal medicine clinic at [phone].     Rocky Morel, DO [facility]

## 2022-12-15 ENCOUNTER — Ambulatory Visit (INDEPENDENT_AMBULATORY_CARE_PROVIDER_SITE_OTHER): Payer: 59 | Admitting: Podiatry

## 2022-12-15 ENCOUNTER — Other Ambulatory Visit: Payer: Self-pay | Admitting: *Deleted

## 2022-12-15 ENCOUNTER — Ambulatory Visit: Payer: 59

## 2022-12-15 DIAGNOSIS — L97512 Non-pressure chronic ulcer of other part of right foot with fat layer exposed: Secondary | ICD-10-CM

## 2022-12-15 DIAGNOSIS — I739 Peripheral vascular disease, unspecified: Secondary | ICD-10-CM

## 2022-12-15 DIAGNOSIS — Z9889 Other specified postprocedural states: Secondary | ICD-10-CM

## 2022-12-15 NOTE — Progress Notes (Signed)
  Subjective:  Patient ID: Vanessa [name], female    DOB: 04-Feb-1961,  MRN: [id number]  Chief Complaint  Patient presents with   Routine Post Op    post op # 1 dos 6.8.2024/ right 4th/ 5th toe amputation Patient states original dressing came off so her daughter re-wrapped everything. There is some swelling , discharge     DOS: 12/02/2022 Procedure: Partial fourth and fifth ray amputation of right foot  62 y.o. female returns for post-op check.  Patient reports for first postop visit on the right foot after partial fourth and fifth ray amputation.  Patient states that they had to change the dressing as it partially came off.  There is been some odor associated with the amputation site.  Has seen some drainage as well.  Review of Systems: Negative except as noted in the HPI. Denies N/V/F/Ch.   Objective:  There were no vitals filed for this visit. There is no height or weight on file to calculate BMI. Constitutional Well developed. Well nourished.  Vascular Foot warm and well perfused. Capillary refill normal to all digits.  Calf is soft and supple, no posterior calf or knee pain, negative Homans' sign  Neurologic Normal speech. Oriented to person, place, and time. Epicritic sensation to light touch grossly present bilaterally.  Dermatologic Attention directed to the amputation site the majority of the incision line is well abdomen without erythema or drainage.  At the area of prior ulceration seen preoperatively underlying the eschar that was removed there is some fibrotic slough and associated malodor however there is no deep probing wound or evidence of deep infection.  At site of prior Kerecis graft there is healthy granular tissue in place.  Bleeding is noted in the wound bed.   Orthopedic: Tenderness to palpation noted about the surgical site.   Multiple view plain film radiographs: Deferred at this visit Assessment:   1. Post-operative state   2. Ulcer of right foot with fat layer  exposed (HCC)    Plan:  Patient was evaluated and treated and all questions answered.  S/p foot surgery right partial fourth and fifth ray amputation -Patient does have residual ulceration present near the amputation site.  There is some malodor associated with this ulceration.  Possible superficial wound infection. Debrided the wound.  -Recommend referral to wound care center and this was placed this visit -Patient is still taking antibiotics, continue -XR: deferred -WB Status: WBAT in post op shoe -Sutures: to remain intact. -Medications: Abx augmentin -Foot redressed. Rec every 2-3 days betadine wet to dry dressing changes, do not get foot wet  Return in about 3 weeks (around 01/05/2023) for f/u R foot ulcer/ post op.         Corinna Gab, DPM [facility] / [facility]

## 2022-12-17 DIAGNOSIS — I5020 Unspecified systolic (congestive) heart failure: Secondary | ICD-10-CM | POA: Insufficient documentation

## 2022-12-17 DIAGNOSIS — I5022 Chronic systolic (congestive) heart failure: Secondary | ICD-10-CM | POA: Insufficient documentation

## 2022-12-17 NOTE — Assessment & Plan Note (Signed)
Pre-op TTE for axillary femoral bypass 11/2022 showed LVEF 50-55% with no diastolic dysfunction. After prolonged maintenance fluids she had oxygen requirements up to 9 L nasal cannula and responded well to IV diuresis.  Did not require any more oxygen during or after hospitalization.  No significant symptoms after discharge.  GDMT is limited by blood pressure, recurrent UTI on SGLT2 inhibitor.  She is on low-dose lisinopril.  No known ischemic history or prior ischemic eval.  Will follow-up next visit and add GDMT as able. - Continue lisinopril 5 mg daily - consider BB addition and cardiology referral at follow up

## 2022-12-17 NOTE — Assessment & Plan Note (Signed)
LDL during hospitalization was 70 while on atorvastatin 80 mg daily.  Ezetimibe 10 mg daily was added.  No significant side effects since starting this medication. - Continue atorvastatin 80 mg daily and ezetimibe 10 mg daily - Repeat lipid panel at next follow-up

## 2022-12-17 NOTE — Assessment & Plan Note (Signed)
Doing well after optimizing to Trelegy Ellipta. Still no known PFTs on file. Previously limited by insurance coverage. Discuss PFTs at next follow up.  - continue trelegy ellipta and prn albuterol

## 2022-12-17 NOTE — Assessment & Plan Note (Signed)
She has not been smoking since hospital discharge this month.  Has nicotine patches but does not use them.  She is very motivated to continue with tobacco cessation after need for axillary femoral bypass in 2 amputation due to severe arterial disease.  She continues on Wellbutrin with benefit. - Continue Wellbutrin 150 mg BID, nicotine patches as needed

## 2022-12-17 NOTE — Assessment & Plan Note (Signed)
Blood pressure stable from recent hospitalization at 123/44 after decreasing lisinopril from 10 to 5 mg daily.  - continue lisinopril 5 mg daily

## 2022-12-17 NOTE — Assessment & Plan Note (Signed)
At her last clinic visit she was Soliqua 30 units nightly.  During recent hospitalization she required much less insulin due to frequent n.p.o. status.  She was discharged with instructions to use 10 units of Soliqua nightly but has been using 30 units of Soliqua nightly.  Lowest fasting blood sugar was this morning at 97 and usually has been in the 120s since discharge.  A1c this month was 9.8.  No reported lows. - Continue Soliqua 30 units nightly, return in 3 months for repeat A1c

## 2022-12-18 NOTE — Assessment & Plan Note (Signed)
Recently hospitalized at [facility] from 11/22/2022 - 12/06/2022 for nonhealing right foot wound underlying the fourth and fifth digit with associated gangrene and evidence of osteomyelitis of the fourth metatarsal on imaging.  Found to have extensive aortic and bilateral lower extremity occlusive disease without good option for local bypass.  Underwent right axillary femoral bypass on 11/29/2022 prior to right 4/5th ray amputation on 12/02/2022.  She has done well since the surgeries and even thinks the blood flow to her left lower extremity has improved.  No significant claudication since procedure.  Unfortunately she had resumed smoking prior to hospitalization but since then has not restarted.  She will follow-up with vascular on 01/01/2023 - Continues on aspirin 81 mg daily, atorvastatin 80 mg daily, and ezetimibe 10 mg daily.

## 2022-12-18 NOTE — Progress Notes (Signed)
Internal Medicine Clinic Attending  Case discussed with Dr. Goodwin  At the time of the visit.  We reviewed the resident's history and exam and pertinent patient test results.  I agree with the assessment, diagnosis, and plan of care documented in the resident's note.  

## 2022-12-18 NOTE — Assessment & Plan Note (Signed)
Recently hospitalized at [facility] from 11/22/2022 - 12/06/2022 for nonhealing right foot wound underlying the fourth and fifth digit with associated gangrene and evidence of osteomyelitis of the fourth metatarsal on imaging.  Complicated by severe PAD with significant aortic and bilateral lower extremity disease requiring axillary femoral bypass prior to 4/5th ray amputation on the right foot on 12/02/2022.  Surgical cultures grew Staphylococcus stimulants, Enterococcus faecalis, and Prevotella melaninogenica.  Sensitivities showed staph was methicillin, clindamycin, and erythromycin resistant.  Prior to surgery she was on ceftriaxone and then completed 7 days of Augmentin and doxycycline after amputation.  Wound here is well wrapped with some continued mild right lower extremity pitting edema to the knee.  She will follow-up with podiatry 12/15/2022 and vascular surgery on 01/01/2023.

## 2022-12-19 DIAGNOSIS — Z0279 Encounter for issue of other medical certificate: Secondary | ICD-10-CM

## 2022-12-22 ENCOUNTER — Encounter: Payer: Self-pay | Admitting: Hematology and Oncology

## 2022-12-25 ENCOUNTER — Encounter: Payer: Self-pay | Admitting: Podiatry

## 2022-12-26 ENCOUNTER — Ambulatory Visit (HOSPITAL_COMMUNITY)
Admission: RE | Admit: 2022-12-26 | Discharge: 2022-12-26 | Disposition: A | Discharge status: Home / Self Care | Payer: 59 | Source: Ambulatory Visit | Attending: Vascular Surgery | Admitting: Vascular Surgery

## 2022-12-26 DIAGNOSIS — I739 Peripheral vascular disease, unspecified: Secondary | ICD-10-CM

## 2023-01-01 ENCOUNTER — Other Ambulatory Visit (HOSPITAL_COMMUNITY): Payer: Self-pay

## 2023-01-01 ENCOUNTER — Other Ambulatory Visit: Payer: Self-pay

## 2023-01-01 ENCOUNTER — Ambulatory Visit (INDEPENDENT_AMBULATORY_CARE_PROVIDER_SITE_OTHER): Payer: 59 | Admitting: Physician Assistant

## 2023-01-01 VITALS — BP 154/79 | HR 87 | Temp 98.3°F | Resp 16 | Ht 62.0 in | Wt 143.0 lb

## 2023-01-01 DIAGNOSIS — I739 Peripheral vascular disease, unspecified: Secondary | ICD-10-CM

## 2023-01-01 MED ORDER — CLOPIDOGREL BISULFATE 75 MG PO TABS
75.0000 mg | ORAL_TABLET | Freq: Every day | ORAL | 6 refills | Status: DC
Start: 2023-01-01 — End: 2023-12-13

## 2023-01-01 NOTE — Progress Notes (Unsigned)
POST OPERATIVE OFFICE NOTE    CC:  F/u for surgery  HPI:  This is a 62 y.o. female who is s/p right axillary to common femoral artery bypass by Dr. Myra Gianotti on 11/29/2022.  Patient believes her right chest and right groin incisions are healing well.  She has noticed a fluid collection around the tunneled bypass through her right flank.  She has not had any drainage or pain in this area.  She subsequently underwent ray amputation of the right fourth and fifth toes by podiatry Dr. Annamary Rummage.  She believes her amp site is healing well.  She continues to walk with a Darco shoe.  She is accompanied today by her daughter who works as a Radiation protection practitioner and has been dressing the amp site on a daily basis.    Allergies  Allergen Reactions   Jardiance [Empagliflozin] Other (See Comments)    Severe yeast infection   Latex Rash   Avandia [Rosiglitazone] Other (See Comments)    Headaches     Current Outpatient Medications  Medication Sig Dispense Refill   acetaminophen (TYLENOL) 500 MG tablet Take 1,000 mg by mouth 2 (two) times daily as needed for mild pain or moderate pain.     albuterol (VENTOLIN HFA) 108 (90 Base) MCG/ACT inhaler INHALE 2 PUFFS BY MOUTH EVERY 6 HOURS AS NEEDED FOR WHEEZING OR  SHORTNESS  OF  BREATH 9 g 0   aspirin EC 81 MG tablet Take 1 tablet (81 mg total) by mouth daily at 6 (six) AM. Swallow whole. 30 tablet 12   atorvastatin (LIPITOR) 80 MG tablet Take 1 tablet (80 mg total) by mouth daily. 90 tablet 2   buPROPion (WELLBUTRIN SR) 150 MG 12 hr tablet Take 1 tablet (150 mg total) by mouth 2 (two) times daily. 60 tablet 2   cetirizine (ZYRTEC) 10 MG tablet Take 1 tablet (10 mg total) by mouth daily. 90 tablet 3   cimetidine (TAGAMET) 200 MG tablet Take 200 mg by mouth daily.     citalopram (CELEXA) 40 MG tablet Take 1 tablet (40 mg total) by mouth daily. 90 tablet 3   clopidogrel (PLAVIX) 75 MG tablet Take 1 tablet (75 mg total) by mouth daily. 30 tablet 6   ezetimibe (ZETIA) 10 MG  tablet Take 1 tablet (10 mg total) by mouth daily. 30 tablet 3   Fluticasone-Umeclidin-Vilant (TRELEGY ELLIPTA) 200-62.5-25 MCG/ACT AEPB Inhale 1 puff into the lungs daily. 60 each 0   glucose blood test strip Use as instructed 100 each 12   Insulin Glargine-Lixisenatide (SOLIQUA) 100-33 UNT-MCG/ML SOPN Inject 10 Units into the skin daily. 3 mL 11   Insulin Pen Needle (PEN NEEDLES) 31G X 5 MM MISC 1 each by Does not apply route daily. Use to inject insulin 100 each 0   lisinopril (ZESTRIL) 5 MG tablet Take 1 tablet (5 mg total) by mouth daily. 30 tablet 0   amoxicillin-clavulanate (AUGMENTIN) 875-125 MG tablet Take 1 tablet by mouth every 12 (twelve) hours. (Patient not taking: Reported on 01/01/2023)     nicotine (NICODERM CQ - DOSED IN MG/24 HOURS) 21 mg/24hr patch Place 1 patch (21 mg total) onto the skin daily as needed (tobacco cravings). (Patient not taking: Reported on 01/01/2023) 28 patch 0   No current facility-administered medications for this visit.     ROS:  See HPI  Physical Exam:  Vitals:   01/01/23 1412  BP: (!) 154/79  Pulse: 87  Resp: 16  Temp: 98.3 F (36.8 C)  TempSrc: Temporal  SpO2: 92%  Weight: 143 lb (64.9 kg)  Height: 5\' 2"  (1.575 m)    Incision:  R chest and groin incisions healed; soft fluid collection along path of bypass without erythema Extremities:  palpable R PT Abdomen:  soft, NT, ND  Assessment/Plan:  This is a 62 y.o. female who is s/p: Right axillary to femoral bypass due to right leg wound  -Right axillary to femoral bypass patent with 2+ femoral pulse and 2+ palpable right PT pulse.  Amputation site of the right foot appears to be healing well.  She will follow-up with her podiatrist this week.  She does have a fluid collection surrounding her bypass graft especially along the lower half.  There is no sign of infection.  Dr. Myra Gianotti evaluated the patient today and saw no indication for intervention.  He added Plavix to her daily regimen.  She will  follow-up in 3 months with repeat imaging.  She knows to return sooner with any questions or concerns.   [name], PA-C [facility] [phone]  Clinic MD:  Myra Gianotti

## 2023-01-03 ENCOUNTER — Encounter: Payer: Self-pay | Admitting: Physician Assistant

## 2023-01-11 ENCOUNTER — Other Ambulatory Visit (HOSPITAL_COMMUNITY): Payer: Self-pay

## 2023-01-11 ENCOUNTER — Encounter: Payer: Self-pay | Admitting: Hematology and Oncology

## 2023-01-16 ENCOUNTER — Other Ambulatory Visit: Payer: Self-pay

## 2023-01-16 DIAGNOSIS — I739 Peripheral vascular disease, unspecified: Secondary | ICD-10-CM

## 2023-01-18 ENCOUNTER — Ambulatory Visit (INDEPENDENT_AMBULATORY_CARE_PROVIDER_SITE_OTHER): Payer: Medicaid Other | Admitting: Podiatry

## 2023-01-18 ENCOUNTER — Other Ambulatory Visit (HOSPITAL_COMMUNITY): Payer: Self-pay

## 2023-01-18 DIAGNOSIS — M86071 Acute hematogenous osteomyelitis, right ankle and foot: Secondary | ICD-10-CM

## 2023-01-18 DIAGNOSIS — Z9889 Other specified postprocedural states: Secondary | ICD-10-CM

## 2023-01-18 DIAGNOSIS — L97512 Non-pressure chronic ulcer of other part of right foot with fat layer exposed: Secondary | ICD-10-CM

## 2023-01-18 NOTE — Progress Notes (Signed)
  Subjective:  Patient ID: Vanessa [name], female    DOB: Nov 05, 1960,  MRN: [id number]  Chief Complaint  Patient presents with   Routine Post Op    hospital post op # 2 dos 6.8.2024/ right 4th/ 5th toe amputation    DOS: 12/02/2022 Procedure: Partial fourth and fifth ray amputation of right foot  62 y.o. female returns for post-op check.  Patient reports for second postop visit on the right foot after partial fourth and fifth ray amputation.  Patient reports she has been doing every other day dressing changes with Betadine.  Reports there has been no drainage.  She is no longer taking any antibiotics.  Urology  Review of Systems: Negative except as noted in the HPI. Denies N/V/F/Ch.   Objective:  There were no vitals filed for this visit. There is no height or weight on file to calculate BMI. Constitutional Well developed. Well nourished.  Vascular Foot warm and well perfused. Capillary refill normal to all digits.  Calf is soft and supple, no posterior calf or knee pain, negative Homans' sign  Neurologic Normal speech. Oriented to person, place, and time. Epicritic sensation to light touch grossly present bilaterally.  Dermatologic Attention directed to the amputation site the majority of the incision line is healing well.  There is a small area of ulceration superficially but it is overall improving.  No drainage is present.  Fibrotic tissue present in wound base.  Overall healing well no significant erythema mild maceration    Orthopedic: Tenderness to palpation noted about the surgical site.   Multiple view plain film radiographs: Deferred at this visit Assessment:   1. Post-operative state   2. Ulcer of right foot with fat layer exposed (HCC)   3. Acute hematogenous osteomyelitis of right foot (HCC)     Plan:  Patient was evaluated and treated and all questions answered.  S/p foot surgery right partial fourth and fifth ray amputation -Patient does have residual ulceration  present near the amputation site.  This ulceration is appears to be improved from prior with infill and decreased size Debrided the wound.  -Recommend go to wound care center referral was placed at prior visit -Patient is off antibiotics this time currently no indication -XR: deferred -WB Status: WBAT in post op shoe -Sutures: Sutures and staples removed and at this visit -Medications: None indicated at this time -Foot redressed. Rec every day dressing change with Betadine wet-to-dry dressing or adhesive bandage dressing  Return in about 4 weeks (around 02/15/2023) for f/u R 4th/5th ray amp.         Corinna Gab, DPM [facility] / [facility]

## 2023-01-23 ENCOUNTER — Ambulatory Visit (HOSPITAL_BASED_OUTPATIENT_CLINIC_OR_DEPARTMENT_OTHER): Payer: 59 | Admitting: Internal Medicine

## 2023-01-31 ENCOUNTER — Encounter: Payer: Self-pay | Admitting: Vascular Surgery

## 2023-01-31 ENCOUNTER — Ambulatory Visit: Payer: Medicaid Other | Admitting: Vascular Surgery

## 2023-01-31 VITALS — BP 158/80 | HR 86 | Temp 98.4°F | Resp 20 | Ht 62.0 in | Wt 148.0 lb

## 2023-01-31 DIAGNOSIS — S301XXD Contusion of abdominal wall, subsequent encounter: Secondary | ICD-10-CM

## 2023-01-31 NOTE — Progress Notes (Signed)
POST OPERATIVE OFFICE NOTE    CC:  F/u for surgery  HPI:  This is a 62 y.o. female who is s/p right axillary to common femoral artery bypass by Dr. Myra Gianotti on 11/29/2022.  Patient was seen today as an add-on for swelling of the right flank.  She states her wound sites of healed nicely, but she continues to have puffiness along the right flank at the site of the graft.  Denies drainage, denies right lower extremity pain.  States her right foot fourth and fifth toe ray amputation site is healing nicely.     Allergies  Allergen Reactions   Jardiance [Empagliflozin] Other (See Comments)    Severe yeast infection   Latex Rash   Avandia [Rosiglitazone] Other (See Comments)    Headaches     Current Outpatient Medications  Medication Sig Dispense Refill   acetaminophen (TYLENOL) 500 MG tablet Take 1,000 mg by mouth 2 (two) times daily as needed for mild pain or moderate pain.     albuterol (VENTOLIN HFA) 108 (90 Base) MCG/ACT inhaler INHALE 2 PUFFS BY MOUTH EVERY 6 HOURS AS NEEDED FOR WHEEZING OR  SHORTNESS  OF  BREATH 9 g 0   amoxicillin-clavulanate (AUGMENTIN) 875-125 MG tablet Take 1 tablet by mouth every 12 (twelve) hours.     aspirin EC 81 MG tablet Take 1 tablet (81 mg total) by mouth daily at 6 (six) AM. Swallow whole. 30 tablet 12   atorvastatin (LIPITOR) 80 MG tablet Take 1 tablet (80 mg total) by mouth daily. 90 tablet 2   buPROPion (WELLBUTRIN SR) 150 MG 12 hr tablet Take 1 tablet (150 mg total) by mouth 2 (two) times daily. 60 tablet 2   cetirizine (ZYRTEC) 10 MG tablet Take 1 tablet (10 mg total) by mouth daily. 90 tablet 3   cimetidine (TAGAMET) 200 MG tablet Take 200 mg by mouth daily.     citalopram (CELEXA) 40 MG tablet Take 1 tablet (40 mg total) by mouth daily. 90 tablet 3   clopidogrel (PLAVIX) 75 MG tablet Take 1 tablet (75 mg total) by mouth daily. 30 tablet 6   ezetimibe (ZETIA) 10 MG tablet Take 1 tablet (10 mg total) by mouth daily. 30 tablet 3    Fluticasone-Umeclidin-Vilant (TRELEGY ELLIPTA) 200-62.5-25 MCG/ACT AEPB Inhale 1 puff into the lungs daily. 60 each 0   glucose blood test strip Use as instructed 100 each 12   Insulin Glargine-Lixisenatide (SOLIQUA) 100-33 UNT-MCG/ML SOPN Inject 10 Units into the skin daily. 3 mL 11   Insulin Pen Needle (PEN NEEDLES) 31G X 5 MM MISC 1 each by Does not apply route daily. Use to inject insulin 100 each 0   lisinopril (ZESTRIL) 5 MG tablet Take 1 tablet (5 mg total) by mouth daily. 30 tablet 0   nicotine (NICODERM CQ - DOSED IN MG/24 HOURS) 21 mg/24hr patch Place 1 patch (21 mg total) onto the skin daily as needed (tobacco cravings). 28 patch 0   No current facility-administered medications for this visit.     ROS:  See HPI  Physical Exam:  Vitals:   01/31/23 1529  BP: (!) 158/80  Pulse: 86  Resp: 20  Temp: 98.4 F (36.9 C)  SpO2: 91%  Weight: 148 lb (67.1 kg)  Height: 5\' 2"  (1.575 m)    Incision:  R chest and groin incisions healed; soft fluid collection along the length of the bypass without erythema Extremities:  palpable R PT Abdomen:  soft, NT, ND  Assessment/Plan:  This  is a 63 y.o. female who is s/p: Right axillary to femoral bypass due to right leg wound  - Right axillary to femoral bypass patent with 2+ femoral pulse, multiphasic PT signal.   - Amputation site of the right foot appears to be healing well.  Mild edema in the RLE - Soft seroma on the right flank.  Skin intact, no erythema.  No concern for infection. - I scheduled her to see Dr. Myra Gianotti in 1 month's time with repeat imaging study. - She was asked to call should any questions or concerns arise.    Victorino Sparrow, MD [facility] [phone]

## 2023-02-07 ENCOUNTER — Other Ambulatory Visit: Payer: Self-pay

## 2023-02-07 DIAGNOSIS — I739 Peripheral vascular disease, unspecified: Secondary | ICD-10-CM

## 2023-02-14 ENCOUNTER — Encounter: Payer: Self-pay | Admitting: Hematology and Oncology

## 2023-02-15 ENCOUNTER — Other Ambulatory Visit: Payer: Self-pay

## 2023-02-15 DIAGNOSIS — I739 Peripheral vascular disease, unspecified: Secondary | ICD-10-CM

## 2023-02-19 ENCOUNTER — Ambulatory Visit (HOSPITAL_COMMUNITY): Admission: RE | Admit: 2023-02-19 | Payer: Medicaid Other | Source: Ambulatory Visit

## 2023-02-19 ENCOUNTER — Ambulatory Visit (HOSPITAL_COMMUNITY): Payer: Medicaid Other | Attending: Surgery

## 2023-02-19 ENCOUNTER — Encounter: Payer: Self-pay | Admitting: Surgery

## 2023-02-19 ENCOUNTER — Ambulatory Visit: Payer: Medicaid Other | Admitting: Surgery

## 2023-02-19 VITALS — BP 166/73 | HR 86 | Temp 98.1°F | Resp 20 | Ht 62.0 in | Wt 153.0 lb

## 2023-02-19 DIAGNOSIS — I7025 Atherosclerosis of native arteries of other extremities with ulceration: Secondary | ICD-10-CM

## 2023-02-19 NOTE — Progress Notes (Signed)
Patient name: Vanessa [name] MRN: [id number] DOB: 26-Nov-1960 Sex: female  REASON FOR VISIT:     Post op  HISTORY OF PRESENT ILLNESS:   Vanessa [name] is a 62 y.o. female who I met in May 2020 for for evaluation of a right foot wound which has been present for about a month.  Imaging revealed severe aortic disease and right iliac occlusion.  Because of her underlying COPD and medical conditioning, it was felt that she was not a candidate for open abdominal surgery.  She has also had prior abdominal surgery including complications from GYN operation.  She underwent right axillary to common femoral bypass graft with 8 mm PTFE on 11/29/2022.  I discussed with her that this is likely a short-term procedure for her given the limited patency of an axillary femoral bypass.  She was seen by Dr. Karin Lieu a few weeks ago for right flank swelling.  Her fourth and fifth toe amputations are healing nicely.  These were done by podiatry.     CURRENT MEDICATIONS:    Current Outpatient Medications  Medication Sig Dispense Refill   acetaminophen (TYLENOL) 500 MG tablet Take 1,000 mg by mouth 2 (two) times daily as needed for mild pain or moderate pain.     albuterol (VENTOLIN HFA) 108 (90 Base) MCG/ACT inhaler INHALE 2 PUFFS BY MOUTH EVERY 6 HOURS AS NEEDED FOR WHEEZING OR  SHORTNESS  OF  BREATH 9 g 0   amoxicillin-clavulanate (AUGMENTIN) 875-125 MG tablet Take 1 tablet by mouth every 12 (twelve) hours.     aspirin EC 81 MG tablet Take 1 tablet (81 mg total) by mouth daily at 6 (six) AM. Swallow whole. 30 tablet 12   buPROPion (WELLBUTRIN SR) 150 MG 12 hr tablet Take 1 tablet (150 mg total) by mouth 2 (two) times daily. 60 tablet 2   cetirizine (ZYRTEC) 10 MG tablet Take 1 tablet (10 mg total) by mouth daily. 90 tablet 3   cimetidine (TAGAMET) 200 MG tablet Take 200 mg by mouth daily.     citalopram (CELEXA) 40 MG tablet Take 1 tablet (40 mg total) by mouth daily. 90 tablet 3    clopidogrel (PLAVIX) 75 MG tablet Take 1 tablet (75 mg total) by mouth daily. 30 tablet 6   ezetimibe (ZETIA) 10 MG tablet Take 1 tablet (10 mg total) by mouth daily. 30 tablet 3   Fluticasone-Umeclidin-Vilant (TRELEGY ELLIPTA) 200-62.5-25 MCG/ACT AEPB Inhale 1 puff into the lungs daily. 60 each 0   glucose blood test strip Use as instructed 100 each 12   Insulin Glargine-Lixisenatide (SOLIQUA) 100-33 UNT-MCG/ML SOPN Inject 10 Units into the skin daily. 3 mL 11   Insulin Pen Needle (PEN NEEDLES) 31G X 5 MM MISC 1 each by Does not apply route daily. Use to inject insulin 100 each 0   lisinopril (ZESTRIL) 5 MG tablet Take 1 tablet (5 mg total) by mouth daily. 30 tablet 0   nicotine (NICODERM CQ - DOSED IN MG/24 HOURS) 21 mg/24hr patch Place 1 patch (21 mg total) onto the skin daily as needed (tobacco cravings). 28 patch 0   atorvastatin (LIPITOR) 80 MG tablet Take 1 tablet (80 mg total) by mouth daily. 90 tablet 2   No current facility-administered medications for this visit.    REVIEW OF SYSTEMS:   [X]  denotes positive finding, [ ]  denotes negative finding Cardiac  Comments:  Chest pain or chest pressure:    Shortness of breath upon exertion:    Short of  breath when lying flat:    Irregular heart rhythm:    Constitutional    Fever or chills:      PHYSICAL EXAM:   Vitals:   02/19/23 1104  BP: (!) 166/73  Pulse: 86  Resp: 20  Temp: 98.1 F (36.7 C)  SpO2: 90%  Weight: 153 lb (69.4 kg)  Height: 5\' 2"  (1.575 m)    GENERAL: The patient is a well-nourished female, in no acute distress. The vital signs are documented above. CARDIOVASCULAR: There is a regular rate and rhythm. PULMONARY: Non-labored respirations Fullness around graft consistent with fluid.  No signs of infection  STUDIES:   None.  Patient showed up late and we could not image her   MEDICAL ISSUES:   She is healing her right toe amputations.  Her axillofemoral graft is patent.  She does have a fair amount of  fluid around the graft but this does not appear to be infected.  I feel that the best option is to continue with observation.  I would be reluctant to try and drain the fluid as this potentially could lead to infection or persistent fluid drainage.  She seems to be tolerating this currently.  I will plan on bring her back in at this time.  Hopefully I am her body will start to reabsorb some of this fluid.  Charlena Cross, MD, FACS [facility] [phone] Pager [phone]

## 2023-02-22 ENCOUNTER — Ambulatory Visit (INDEPENDENT_AMBULATORY_CARE_PROVIDER_SITE_OTHER): Payer: Medicaid Other | Admitting: Podiatry

## 2023-02-22 DIAGNOSIS — M86071 Acute hematogenous osteomyelitis, right ankle and foot: Secondary | ICD-10-CM

## 2023-02-22 DIAGNOSIS — L97512 Non-pressure chronic ulcer of other part of right foot with fat layer exposed: Secondary | ICD-10-CM

## 2023-02-22 DIAGNOSIS — Z9889 Other specified postprocedural states: Secondary | ICD-10-CM

## 2023-02-22 MED ORDER — GABAPENTIN 300 MG PO CAPS: 300.0000 mg | ORAL_CAPSULE | Freq: Every day | ORAL | 2 refills | Status: DC

## 2023-02-22 NOTE — Progress Notes (Signed)
  Subjective:  Patient ID: Vanessa [name], female    DOB: May 11, 1961,  MRN: [id number]  Chief Complaint  Patient presents with   Follow-up    4 week f/u right 4th/5th ray amp. Denies any drainage to area at this time. C/o occasionally pain to the top of right foot starting at the medial aspect of foot to lateral aspect of foot.    Nail Problem    Nail trim     DOS: 12/02/2022 Procedure: Partial fourth and fifth ray amputation of right foot  62 y.o. female returns for post-op check.  Patient reports for  postop visit on the right foot after partial fourth and fifth ray amputation approx 2 mo ago.  He denies any drainage at the site.  Overall doing well.  Has occasional pain to the surgical site overall she says the wound is improving she denies much drainage there.  Review of Systems: Negative except as noted in the HPI. Denies N/V/F/Ch.   Objective:  There were no vitals filed for this visit. There is no height or weight on file to calculate BMI. Constitutional Well developed. Well nourished.  Vascular Foot warm and well perfused. Capillary refill normal to all digits.  Calf is soft and supple, no posterior calf or knee pain, negative Homans' sign  Neurologic Normal speech. Oriented to person, place, and time. Epicritic sensation to light touch grossly present bilaterally.  Dermatologic Attention directed to the amputation site there is decreasing open wound.  There is dry with eschar overlying.  Small opening approximately 0.5 cm without erythema or active drainage.  Does not probe deep.    Orthopedic: Tenderness to palpation noted about the surgical site.   Multiple view plain film radiographs: Deferred at this visit Assessment:   1. Post-operative state   2. Ulcer of right foot with fat layer exposed (HCC)   3. 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  S/p foot surgery right partial fourth  and fifth ray amputation -Patient does have residual ulceration present near the amputation site.  This ulceration is appears to be improved from prior with infill and decreased size Debrided the wound.  -Patient is off antibiotics this time currently no indication -XR: deferred -WB Status: WBAT in post op shoe, okay to transition back to regular shoe gear -Medications: Here extra gabapentin 300 mg once daily at night for possible neuropathic pain at the surgical site.  Discussed risks and side effects associated with gabapentin -Foot redressed. Rec daily dressing change with Betadine and adhesive bandage.  Okay to get wet foot wet in shower to dry thoroughly after and replace Betadine and Band-Aid to the small opening  Return in about 4 weeks (around 03/22/2023) for POV R foot 4th and 5th ray amps.         Corinna Gab, DPM [facility] / [facility]

## 2023-03-07 ENCOUNTER — Encounter: Payer: Self-pay | Admitting: Internal Medicine

## 2023-03-22 ENCOUNTER — Ambulatory Visit (INDEPENDENT_AMBULATORY_CARE_PROVIDER_SITE_OTHER): Payer: Medicaid Other | Admitting: Podiatry

## 2023-03-22 DIAGNOSIS — M86171 Other acute osteomyelitis, right ankle and foot: Secondary | ICD-10-CM | POA: Diagnosis not present

## 2023-03-22 DIAGNOSIS — Z9889 Other specified postprocedural states: Secondary | ICD-10-CM

## 2023-03-22 DIAGNOSIS — L97512 Non-pressure chronic ulcer of other part of right foot with fat layer exposed: Secondary | ICD-10-CM | POA: Diagnosis not present

## 2023-03-22 DIAGNOSIS — E119 Type 2 diabetes mellitus without complications: Secondary | ICD-10-CM

## 2023-03-22 NOTE — Progress Notes (Signed)
Subjective:  Patient ID: Vanessa [name], female    DOB: 12/13/1960,  MRN: [id number]  Chief Complaint  Patient presents with   Routine Post Op    hospital post op #4 dos 6.8.2024/ right 4th/ 5th toe amputation    DOS: 12/02/2022 Procedure: Partial fourth and fifth ray amputation of right foot  62 y.o. female returns for post-op check.  Patient reports for  postop visit on the right foot after partial fourth and fifth ray amputation over 3 mo ago. denies any drainage at the site.  Overall doing well.  Has occasional phantom pain to the amputation site however doing well overall.  Thinks wound is fully healed.  Review of Systems: Negative except as noted in the HPI. Denies N/V/F/Ch.   Objective:  There were no vitals filed for this visit. There is no height or weight on file to calculate BMI. Constitutional Well developed. Well nourished.  Vascular Foot warm and well perfused. Capillary refill normal to all digits.  Calf is soft and supple, no posterior calf or knee pain, negative Homans' sign  Neurologic Normal speech. Oriented to person, place, and time. Epicritic sensation to light touch grossly absent distally bilaterally  Dermatologic Attention directed to the amputation site there is eschar at site of prior open wound at the distal amputation site upon debridement of overlying eschar there is a small pinpoint superficial ulceration because of subcutaneous fat tissue however it is for all intents and purposes healed at this time.  No drainage present.  No erythema no malodor   Orthopedic: No tenderness to palpation noted about the surgical site.   Multiple view plain film radiographs: Deferred at this visit Assessment:   1. Ulcer of right foot with fat layer exposed (HCC)   2. Other acute osteomyelitis of right foot (HCC)   3. Post-operative state   4. Diabetes mellitus, type II, insulin dependent (HCC)       Plan:  Patient was evaluated and treated and all questions  answered.  S/p foot surgery right partial fourth and fifth ray amputation -Patient prior small ulceration at the amputation site is now nearly fully healed. Debrided the wound.  -Patient is off antibiotics this time currently no indication -XR: deferred -WB Status: WBAT in post op shoe, okay to transition back to regular shoe gear -Medications: Gabapentin 300 mg once to twice daily as needed for nerve pain -Continue daily dressing changes with mupirocin ointment and Band-Aid over the small pinpoint wound at the amputation site however she can get the area wet -Recommend diabetic shoes and liners for the patient and place order for this at this visit  Return in about 9 weeks (around 05/24/2023) for [facility].         Corinna Gab, DPM [facility] / [facility]

## 2023-04-02 ENCOUNTER — Encounter (HOSPITAL_COMMUNITY): Payer: 59

## 2023-04-02 ENCOUNTER — Ambulatory Visit: Payer: 59

## 2023-04-13 ENCOUNTER — Other Ambulatory Visit: Payer: Self-pay

## 2023-04-13 DIAGNOSIS — S301XXD Contusion of abdominal wall, subsequent encounter: Secondary | ICD-10-CM

## 2023-04-26 NOTE — Addendum Note (Signed)
Addended by: Leilani Able, Chancelor Hardrick A on: 04/26/2023 07:58 AM   Modules accepted: Orders

## 2023-05-04 ENCOUNTER — Ambulatory Visit (INDEPENDENT_AMBULATORY_CARE_PROVIDER_SITE_OTHER): Payer: 59 | Admitting: Podiatry

## 2023-05-04 DIAGNOSIS — Z91199 Patient's noncompliance with other medical treatment and regimen due to unspecified reason: Secondary | ICD-10-CM

## 2023-05-04 NOTE — Progress Notes (Signed)
 Patient absent for apointment

## 2023-05-07 ENCOUNTER — Ambulatory Visit
Admission: RE | Admit: 2023-05-07 | Discharge: 2023-05-07 | Disposition: A | Discharge status: Home / Self Care | Payer: Medicaid Other | Source: Ambulatory Visit | Attending: Surgery | Admitting: Surgery

## 2023-05-07 DIAGNOSIS — S301XXD Contusion of abdominal wall, subsequent encounter: Secondary | ICD-10-CM

## 2023-05-07 MED ORDER — IOPAMIDOL (ISOVUE-370) INJECTION 76%
200.0000 mL | Freq: Once | INTRAVENOUS | Status: AC | PRN
  Administered 2023-05-07: 100 mL via INTRAVENOUS

## 2023-05-14 ENCOUNTER — Ambulatory Visit: Payer: Medicaid Other | Admitting: Surgery

## 2023-05-14 ENCOUNTER — Encounter: Payer: Self-pay | Admitting: Surgery

## 2023-05-14 VITALS — BP 139/71 | HR 101 | Temp 97.9°F | Resp 20 | Ht 62.0 in | Wt 154.0 lb

## 2023-05-14 DIAGNOSIS — I7025 Atherosclerosis of native arteries of other extremities with ulceration: Secondary | ICD-10-CM

## 2023-05-14 DIAGNOSIS — I70213 Atherosclerosis of native arteries of extremities with intermittent claudication, bilateral legs: Secondary | ICD-10-CM | POA: Diagnosis not present

## 2023-05-14 MED ORDER — DOXYCYCLINE HYCLATE 100 MG PO CAPS
100.0000 mg | ORAL_CAPSULE | Freq: Two times a day (BID) | ORAL | 0 refills | Status: DC
Start: 2023-05-14 — End: 2023-08-02

## 2023-05-14 NOTE — Progress Notes (Signed)
[facility] [place]  Patient name: Vanessa [name] MRN: [id number] DOB: 06/19/1961 Sex: female   REASON FOR VISIT:    Follow up  HISOTRY OF PRESENT ILLNESS:   Vanessa [name] is a 62 y.o. female who I met in May 2020 for for evaluation of a right foot wound which has been present for about a month.  Imaging revealed severe aortic disease and right iliac occlusion.  Because of her underlying COPD and medical conditioning, it was felt that she was not a candidate for open abdominal surgery.  She has also had prior abdominal surgery including complications from GYN operation.  She underwent right axillary to common femoral bypass graft with 8 mm PTFE on 11/29/2022.  I discussed with her that this is likely a short-term procedure for her given the limited patency of an axillary femoral bypass.  She was seen by Dr. Karin Lieu a few weeks ago for right flank swelling.  Her fourth and fifth toe amputations are healing nicely.  These were done by podiatry.   Her biggest complaint is pain and swelling over top of her bypass graft.  She also states that she has a new wound on her left leg PAST MEDICAL HISTORY:   Past Medical History:  Diagnosis Date   Asthma    No PFT performed   Blood transfusion without reported diagnosis    has to donate blood due to having "thick" blood.   Complication of anesthesia    Woken up afterwards with panic attacks   COPD (chronic obstructive pulmonary disease) (HCC)    Depression    Diabetes mellitus 2002   GERD (gastroesophageal reflux disease)    Helicobacter pylori (H. pylori) infection    s/p triple therapy   Hepatic hemangioma    History of kidney stones    Hyperlipidemia    Hypertension    Hypertriglyceridemia    Panic attacks    mostly Agaraphobia   Pneumonia    Ventral hernia      FAMILY HISTORY:   Family History  Problem Relation Age of Onset   Colon polyps Mother        benign   Ovarian cancer  Mother 56   Breast cancer Maternal Aunt        dx late 53s   Diabetes Maternal Uncle    Colon cancer Neg Hx    Stomach cancer Neg Hx    Endometrial cancer Neg Hx    Prostate cancer Neg Hx    Pancreatic cancer Neg Hx    Esophageal cancer Neg Hx    Rectal cancer Neg Hx     SOCIAL HISTORY:   Social History   Tobacco Use   Smoking status: Former    Current packs/day: 1.00    Average packs/day: 1 pack/day for 43.0 years (43.0 ttl pk-yrs)    Types: Cigarettes   Smokeless tobacco: Never   Tobacco comments:    Wants Wellbutrin   Substance Use Topics   Alcohol use: No    Alcohol/week: 0.0 standard drinks of alcohol    Comment: none     ALLERGIES:   Allergies  Allergen Reactions   Jardiance [Empagliflozin] Other (See Comments)    Severe yeast infection   Latex Rash   Avandia [Rosiglitazone] Other (See Comments)    Headaches      CURRENT MEDICATIONS:   Current Outpatient Medications  Medication Sig Dispense Refill   acetaminophen (TYLENOL) 500 MG tablet Take 1,000 mg by mouth 2 (two)  times daily as needed for mild pain or moderate pain.     albuterol (VENTOLIN HFA) 108 (90 Base) MCG/ACT inhaler INHALE 2 PUFFS BY MOUTH EVERY 6 HOURS AS NEEDED FOR WHEEZING OR  SHORTNESS  OF  BREATH 9 g 0   amoxicillin-clavulanate (AUGMENTIN) 875-125 MG tablet Take 1 tablet by mouth every 12 (twelve) hours.     aspirin EC 81 MG tablet Take 1 tablet (81 mg total) by mouth daily at 6 (six) AM. Swallow whole. 30 tablet 12   buPROPion (WELLBUTRIN SR) 150 MG 12 hr tablet Take 1 tablet (150 mg total) by mouth 2 (two) times daily. 60 tablet 2   cetirizine (ZYRTEC) 10 MG tablet Take 1 tablet (10 mg total) by mouth daily. 90 tablet 3   cimetidine (TAGAMET) 200 MG tablet Take 200 mg by mouth daily.     citalopram (CELEXA) 40 MG tablet Take 1 tablet (40 mg total) by mouth daily. 90 tablet 3   clopidogrel (PLAVIX) 75 MG tablet Take 1 tablet (75 mg total) by mouth daily. 30 tablet 6   doxycycline  (VIBRAMYCIN) 100 MG capsule Take 1 capsule (100 mg total) by mouth 2 (two) times daily. 28 capsule 0   ezetimibe (ZETIA) 10 MG tablet Take 1 tablet (10 mg total) by mouth daily. 30 tablet 3   Fluticasone-Umeclidin-Vilant (TRELEGY ELLIPTA) 200-62.5-25 MCG/ACT AEPB Inhale 1 puff into the lungs daily. 60 each 0   gabapentin (NEURONTIN) 300 MG capsule Take 1 capsule (300 mg total) by mouth at bedtime. 30 capsule 2   glucose blood test strip Use as instructed 100 each 12   Insulin Glargine-Lixisenatide (SOLIQUA) 100-33 UNT-MCG/ML SOPN Inject 10 Units into the skin daily. 3 mL 11   Insulin Pen Needle (PEN NEEDLES) 31G X 5 MM MISC 1 each by Does not apply route daily. Use to inject insulin 100 each 0   lisinopril (ZESTRIL) 5 MG tablet Take 1 tablet (5 mg total) by mouth daily. 30 tablet 0   nicotine (NICODERM CQ - DOSED IN MG/24 HOURS) 21 mg/24hr patch Place 1 patch (21 mg total) onto the skin daily as needed (tobacco cravings). 28 patch 0   atorvastatin (LIPITOR) 80 MG tablet Take 1 tablet (80 mg total) by mouth daily. 90 tablet 2   No current facility-administered medications for this visit.    REVIEW OF SYSTEMS:   [X]  denotes positive finding, [ ]  denotes negative finding Cardiac  Comments:  Chest pain or chest pressure:    Shortness of breath upon exertion:    Short of breath when lying flat:    Irregular heart rhythm:        Vascular    Pain in calf, thigh, or hip brought on by ambulation:    Pain in feet at night that wakes you up from your sleep:     Blood clot in your veins:    Leg swelling:         Pulmonary    Oxygen at home:    Productive cough:     Wheezing:         Neurologic    Sudden weakness in arms or legs:     Sudden numbness in arms or legs:     Sudden onset of difficulty speaking or slurred speech:    Temporary loss of vision in one eye:     Problems with dizziness:         Gastrointestinal    Blood in stool:     Vomited blood:  Genitourinary     Burning when urinating:     Blood in urine:        Psychiatric    Major depression:         Hematologic    Bleeding problems:    Problems with blood clotting too easily:        Skin    Rashes or ulcers:        Constitutional    Fever or chills:      PHYSICAL EXAM:   Vitals:   05/14/23 1217  BP: 139/71  Pulse: (!) 101  Resp: 20  Temp: 97.9 F (36.6 C)  SpO2: (!) 86%  Weight: 154 lb (69.9 kg)  Height: 5\' 2"  (1.575 m)    GENERAL: The patient is a well-nourished female, in no acute distress. The vital signs are documented above. CARDIAC: There is a regular rate and rhythm.  VASCULAR: Palpable graft pulse with obvious fluid. PULMONARY: Non-labored respirations ABDOMEN: Soft and non-tender  MUSCULOSKELETAL: There are no major deformities or cyanosis. NEUROLOGIC: No focal weakness or paresthesias are detected. SKIN: See photo below PSYCHIATRIC: The patient has a normal affect.  STUDIES:   I have reviewed the following CTA VASCULAR   1. Right axillary-femoral bypass graft is patent. Low-density fluid surrounding the graft. There is no gas within this peri-graft fluid collection and no significant inflammatory changes around the fluid collection. Findings are suggestive for a seroma surrounding the bypass graft. 2. Diffuse atherosclerotic disease in the abdominal aorta with stenosis as described. Findings are similar to the exam on 11/25/2022. 3. 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 4. Left iliac arteries are patent with diffuse narrowing and areas of stenosis as described. 5. Proximal femoral arteries are patent bilaterally. 6. 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   NON-VASCULAR   1. Gas in the urinary bladder is likely iatrogenic. The colon is adjacent to the anterior aspect of the bladder but no significant inflammatory changes between the colon and the urinary  bladder. 2. Chronic left adrenal nodule. This nodule is indeterminate based on this examination but likely benign based on the stability.   MEDICAL ISSUES:   Right axillofemoral bypass graft has surrounding fluid.  I told her I was reluctant to try to aspirate this for concerns that it would return or contamination of the graft.  Hopefully this will decrease over time but I told her there is a possibility that it may persist  New left ankle wound: I am giving her doxycycline for 2 weeks.  She will return for follow-up.  If the wound has not healed, she will need to be scheduled for angiography.  This will be for left femoral approach with plans for intervening on the iliac system    [name], IV, MD, FACS [facility] [phone] Pager [phone]

## 2023-05-22 ENCOUNTER — Encounter: Payer: Self-pay | Admitting: Hematology and Oncology

## 2023-05-28 ENCOUNTER — Ambulatory Visit: Payer: Medicaid Other | Admitting: Physician Assistant

## 2023-05-28 VITALS — BP 182/75 | HR 101 | Temp 98.1°F | Ht 62.0 in | Wt 153.7 lb

## 2023-05-28 DIAGNOSIS — I70213 Atherosclerosis of native arteries of extremities with intermittent claudication, bilateral legs: Secondary | ICD-10-CM

## 2023-05-28 DIAGNOSIS — S301XXD Contusion of abdominal wall, subsequent encounter: Secondary | ICD-10-CM | POA: Diagnosis not present

## 2023-05-28 NOTE — Progress Notes (Signed)
Office Note     CC:  follow up Requesting Provider:  Rocky Morel, DO  HPI: Vanessa [name] is a 62 y.o. (02-18-1961) female who presents for evaluation of left medial ankle wound.  She was seen in the office last on November 18 by Dr. Myra Gianotti.  This is a traumatic wound after hitting her ankle on her furniture.  She has been cleansing it daily with soap and water and applying Betadine.  She believes it is improving tremendously since being seen in the office.  Surgical history significant for right axillary to common femoral bypass with PTFE in June 2024 by Dr. Myra Gianotti.  She also has fluid collections along the her tunneled axillofemoral bypass.  She believes they have not changed in size.  She denies claudication and rest pain.  She is on a daily aspirin, Plavix, statin.   Past Medical History:  Diagnosis Date   Asthma    No PFT performed   Blood transfusion without reported diagnosis    has to donate blood due to having "thick" blood.   Complication of anesthesia    Woken up afterwards with panic attacks   COPD (chronic obstructive pulmonary disease) (HCC)    Depression    Diabetes mellitus 2002   GERD (gastroesophageal reflux disease)    Helicobacter pylori (H. pylori) infection    s/p triple therapy   Hepatic hemangioma    History of kidney stones    Hyperlipidemia    Hypertension    Hypertriglyceridemia    Panic attacks    mostly Agaraphobia   Pneumonia    Ventral hernia     Past Surgical History:  Procedure Laterality Date   ABDOMINAL AORTOGRAM W/LOWER EXTREMITY N/A 11/24/2022   Procedure: ABDOMINAL AORTOGRAM W/LOWER EXTREMITY;  Surgeon: Leonie Douglas, MD;  Location: [facility];  Service: Cardiovascular;  Laterality: N/A;   AMPUTATION Right 12/02/2022   Procedure: RAY AMPUTATION OF  RIGHT 5TH & 4TH TOES;  Surgeon: Pilar Plate, DPM;  Location: [facility];  Service: Podiatry;  Laterality: Right;   APPLICATION OF WOUND VAC Right 11/29/2022   Procedure:  APPLICATION OF WOUND VAC AND KERECIS MICRO WOUND GRAFT;  Surgeon: Nada Libman, MD;  Location: [facility];  Service: Vascular;  Laterality: Right;   AXILLARY-FEMORAL BYPASS GRAFT Right 11/29/2022   Procedure: RIGHT AXILLA ARTERY - RIGHT FEMORAL ARTERY BYPASS GRAFT USING 8mm X 70cm PROPATEN GORE GRAFT WITH REMOVABLE RINGS;  Surgeon: Nada Libman, MD;  Location: [facility];  Service: Vascular;  Laterality: Right;   CESAREAN SECTION     with 3 children   COLONOSCOPY  2018   OVARIAN CYST REMOVAL     Age 21   ROBOTIC ASSISTED BILATERAL SALPINGO OOPHERECTOMY Bilateral 11/23/2021   Procedure: XI ROBOTIC ASSISTED BILATERAL SALPINGO OOPHORECTOMY,  MINI LAPAROTOMY;  Surgeon: Carver Fila, MD;  Location: [facility];  Service: Gynecology;  Laterality: Bilateral;   ROBOTIC ASSISTED TOTAL HYSTERECTOMY N/A 11/23/2021   Procedure: XI ROBOTIC ASSISTED TOTAL HYSTERECTOMY, STAGING, CYSTOSCOPY;  Surgeon: Carver Fila, MD;  Location: [facility];  Service: Gynecology;  Laterality: N/A;   TUBAL LIGATION     UPPER GASTROINTESTINAL ENDOSCOPY  2018    Social History   Socioeconomic History   Marital status: Married    Spouse name: Not on file   Number of children: Not on file   Years of education: Not on file   Highest education level: Not on file  Occupational History   Not on file  Tobacco Use   Smoking status: Former    Current packs/day: 1.00    Average packs/day: 1 pack/day for 43.0 years (43.0 ttl pk-yrs)    Types: Cigarettes   Smokeless tobacco: Never   Tobacco comments:    Wants Wellbutrin   Vaping Use   Vaping status: Every Day   Substances: Nicotine  Substance and Sexual Activity   Alcohol use: No    Alcohol/week: 0.0 standard drinks of alcohol    Comment: none   Drug use: No   Sexual activity: Not Currently  Other Topics Concern   Not on file  Social History Narrative   Drinks at least a pot of coffee every day.    Social Determinants of Health   Financial Resource Strain: Not on  file  Food Insecurity: No Food Insecurity (11/22/2022)   Hunger Vital Sign    Worried About Running Out of Food in the Last Year: Never true    Ran Out of Food in the Last Year: Never true  Transportation Needs: No Transportation Needs (11/22/2022)   PRAPARE - Administrator, Civil Service (Medical): No    Lack of Transportation (Non-Medical): No  Physical Activity: Not on file  Stress: Not on file  Social Connections: Moderately Isolated (10/25/2022)   Social Connection and Isolation Panel [NHANES]    Frequency of Communication with Friends and Family: More than three times a week    Frequency of Social Gatherings with Friends and Family: More than three times a week    Attends Religious Services: Never    Database administrator or Organizations: No    Attends Banker Meetings: Never    Marital Status: Married  Catering manager Violence: Not At Risk (11/22/2022)   Humiliation, Afraid, Rape, and Kick questionnaire    Fear of Current or Ex-Partner: No    Emotionally Abused: No    Physically Abused: No    Sexually Abused: No    Family History  Problem Relation Age of Onset   Colon polyps Mother        benign   Ovarian cancer Mother 18   Breast cancer Maternal Aunt        dx late 42s   Diabetes Maternal Uncle    Colon cancer Neg Hx    Stomach cancer Neg Hx    Endometrial cancer Neg Hx    Prostate cancer Neg Hx    Pancreatic cancer Neg Hx    Esophageal cancer Neg Hx    Rectal cancer Neg Hx     Current Outpatient Medications  Medication Sig Dispense Refill   acetaminophen (TYLENOL) 500 MG tablet Take 1,000 mg by mouth 2 (two) times daily as needed for mild pain or moderate pain.     albuterol (VENTOLIN HFA) 108 (90 Base) MCG/ACT inhaler INHALE 2 PUFFS BY MOUTH EVERY 6 HOURS AS NEEDED FOR WHEEZING OR  SHORTNESS  OF  BREATH 9 g 0   amoxicillin-clavulanate (AUGMENTIN) 875-125 MG tablet Take 1 tablet by mouth every 12 (twelve) hours.     aspirin EC 81 MG  tablet Take 1 tablet (81 mg total) by mouth daily at 6 (six) AM. Swallow whole. 30 tablet 12   buPROPion (WELLBUTRIN SR) 150 MG 12 hr tablet Take 1 tablet (150 mg total) by mouth 2 (two) times daily. 60 tablet 2   cetirizine (ZYRTEC) 10 MG tablet Take 1 tablet (10 mg total) by mouth daily. 90 tablet 3   cimetidine (TAGAMET) 200 MG tablet  Take 200 mg by mouth daily.     citalopram (CELEXA) 40 MG tablet Take 1 tablet (40 mg total) by mouth daily. 90 tablet 3   clopidogrel (PLAVIX) 75 MG tablet Take 1 tablet (75 mg total) by mouth daily. 30 tablet 6   doxycycline (VIBRAMYCIN) 100 MG capsule Take 1 capsule (100 mg total) by mouth 2 (two) times daily. 28 capsule 0   ezetimibe (ZETIA) 10 MG tablet Take 1 tablet (10 mg total) by mouth daily. 30 tablet 3   Fluticasone-Umeclidin-Vilant (TRELEGY ELLIPTA) 200-62.5-25 MCG/ACT AEPB Inhale 1 puff into the lungs daily. 60 each 0   glucose blood test strip Use as instructed 100 each 12   Insulin Glargine-Lixisenatide (SOLIQUA) 100-33 UNT-MCG/ML SOPN Inject 10 Units into the skin daily. 3 mL 11   Insulin Pen Needle (PEN NEEDLES) 31G X 5 MM MISC 1 each by Does not apply route daily. Use to inject insulin 100 each 0   lisinopril (ZESTRIL) 5 MG tablet Take 1 tablet (5 mg total) by mouth daily. 30 tablet 0   nicotine (NICODERM CQ - DOSED IN MG/24 HOURS) 21 mg/24hr patch Place 1 patch (21 mg total) onto the skin daily as needed (tobacco cravings). 28 patch 0   atorvastatin (LIPITOR) 80 MG tablet Take 1 tablet (80 mg total) by mouth daily. 90 tablet 2   gabapentin (NEURONTIN) 300 MG capsule Take 1 capsule (300 mg total) by mouth at bedtime. 30 capsule 2   No current facility-administered medications for this visit.    Allergies  Allergen Reactions   Jardiance [Empagliflozin] Other (See Comments)    Severe yeast infection   Latex Rash   Avandia [Rosiglitazone] Other (See Comments)    Headaches      REVIEW OF SYSTEMS:   [X]  denotes positive finding, [ ]   denotes negative finding Cardiac  Comments:  Chest pain or chest pressure:    Shortness of breath upon exertion:    Short of breath when lying flat:    Irregular heart rhythm:        Vascular    Pain in calf, thigh, or hip brought on by ambulation:    Pain in feet at night that wakes you up from your sleep:     Blood clot in your veins:    Leg swelling:         Pulmonary    Oxygen at home:    Productive cough:     Wheezing:         Neurologic    Sudden weakness in arms or legs:     Sudden numbness in arms or legs:     Sudden onset of difficulty speaking or slurred speech:    Temporary loss of vision in one eye:     Problems with dizziness:         Gastrointestinal    Blood in stool:     Vomited blood:         Genitourinary    Burning when urinating:     Blood in urine:        Psychiatric    Major depression:         Hematologic    Bleeding problems:    Problems with blood clotting too easily:        Skin    Rashes or ulcers:        Constitutional    Fever or chills:      PHYSICAL EXAMINATION:  Vitals:   05/28/23 1239  BP: Marland Kitchen)  182/75  Pulse: (!) 101  Temp: 98.1 F (36.7 C)  TempSrc: Temporal  SpO2: 90%  Weight: 153 lb 11.2 oz (69.7 kg)  Height: 5\' 2"  (1.575 m)    General:  WDWN in NAD; vital signs documented above Gait: Not observed HENT: WNL, normocephalic Pulmonary: normal non-labored breathing , without Rales, rhonchi,  wheezing Cardiac: regular HR Abdomen: soft, NT, no masses Skin: without rashes Vascular Exam/Pulses: palpable R PT; absent L pedal pulses Extremities: without ischemic changes, without Gangrene , without cellulitis; L ankle wound pictured below Musculoskeletal: no muscle wasting or atrophy  Neurologic: A&O X 3 Psychiatric:  The pt has Normal affect.      ASSESSMENT/PLAN:: 63 y.o. female here for follow up for evaluation of L ankle wound   Right foot is well-perfused with a palpable PT pulse.  Fluid surrounding  axillofemoral bypass graft is stable.  She is aware of the infection risk with aspiration or evacuation of fluid.   Since last office visit she believes her wound on her left ankle is much smaller.  She will continue daily cleansing with soap and water and applying Betadine.  If this wound worsens or fails to improve she will require angiography via left femoral approach.  She will follow-up in 2 weeks for another wound check when Dr. Myra Gianotti is in the office.   [name], PA-C [facility] [phone]  Clinic MD:   Karin Lieu on call

## 2023-06-11 ENCOUNTER — Other Ambulatory Visit: Payer: Self-pay | Admitting: Student

## 2023-06-11 DIAGNOSIS — I10 Essential (primary) hypertension: Secondary | ICD-10-CM

## 2023-06-12 ENCOUNTER — Ambulatory Visit: Payer: Medicaid Other | Admitting: Student

## 2023-06-12 VITALS — BP 144/67 | HR 91 | Temp 98.2°F | Ht 62.0 in | Wt 156.0 lb

## 2023-06-12 DIAGNOSIS — J441 Chronic obstructive pulmonary disease with (acute) exacerbation: Secondary | ICD-10-CM

## 2023-06-12 DIAGNOSIS — I10 Essential (primary) hypertension: Secondary | ICD-10-CM | POA: Diagnosis not present

## 2023-06-12 DIAGNOSIS — E1151 Type 2 diabetes mellitus with diabetic peripheral angiopathy without gangrene: Secondary | ICD-10-CM | POA: Diagnosis present

## 2023-06-12 DIAGNOSIS — Z794 Long term (current) use of insulin: Secondary | ICD-10-CM

## 2023-06-12 DIAGNOSIS — Z23 Encounter for immunization: Secondary | ICD-10-CM

## 2023-06-12 DIAGNOSIS — E785 Hyperlipidemia, unspecified: Secondary | ICD-10-CM | POA: Diagnosis not present

## 2023-06-12 DIAGNOSIS — E119 Type 2 diabetes mellitus without complications: Secondary | ICD-10-CM

## 2023-06-12 DIAGNOSIS — J449 Chronic obstructive pulmonary disease, unspecified: Secondary | ICD-10-CM | POA: Diagnosis not present

## 2023-06-12 DIAGNOSIS — F17200 Nicotine dependence, unspecified, uncomplicated: Secondary | ICD-10-CM

## 2023-06-12 DIAGNOSIS — F17211 Nicotine dependence, cigarettes, in remission: Secondary | ICD-10-CM

## 2023-06-12 DIAGNOSIS — Z9889 Other specified postprocedural states: Secondary | ICD-10-CM | POA: Diagnosis not present

## 2023-06-12 DIAGNOSIS — J410 Simple chronic bronchitis: Secondary | ICD-10-CM

## 2023-06-12 LAB — GLUCOSE, CAPILLARY: Glucose-Capillary: 309 mg/dL — ABNORMAL HIGH (ref 70–99)

## 2023-06-12 LAB — POCT GLYCOSYLATED HEMOGLOBIN (HGB A1C): Hemoglobin A1C: 12.4 % — AB (ref 4.0–5.6)

## 2023-06-12 MED ORDER — ALBUTEROL SULFATE HFA 108 (90 BASE) MCG/ACT IN AERS: 1.0000 | INHALATION_SPRAY | Freq: Four times a day (QID) | RESPIRATORY_TRACT | 3 refills | Status: DC | PRN

## 2023-06-12 MED ORDER — LISINOPRIL 5 MG PO TABS: 5.0000 mg | ORAL_TABLET | Freq: Every day | ORAL | 0 refills | Status: DC

## 2023-06-12 MED ORDER — TRELEGY ELLIPTA 200-62.5-25 MCG/ACT IN AEPB: 1.0000 | INHALATION_SPRAY | Freq: Every day | RESPIRATORY_TRACT | 9 refills | Status: DC

## 2023-06-12 NOTE — Patient Instructions (Addendum)
Thank you, Ms. Vanessa [name] for allowing [place] to provide your care today.   Your A1c today is 12.4, which is higher than goal. Please change your dose on the following schedule:   Every week, check your blood sugars in the morning before breakfast. If the number is above 180 for 4 days in a row, increase the Soliqua by 2 units. If it is below 100 for 4 days, decrease the Soliqua dose by 2 units. Make these changes until your morning blood sugars are between 100-180.  We are checking your cholesterol on your blood work today. We may have you start taking a medication called Zetia if the numbers are high, but we will call you if that is the case.   Congratulations on quitting smoking!   I have ordered the following labs for you:  Lab Orders         Glucose, capillary         Lipid Profile         BMP8+Anion Gap         POC Hbg A1C      I have ordered the following medication/changed the following medications:   Meds ordered this encounter  Medications   lisinopril (ZESTRIL) 5 MG tablet    Sig: Take 1 tablet (5 mg total) by mouth daily.    Dispense:  30 tablet    Refill:  0   albuterol (VENTOLIN HFA) 108 (90 Base) MCG/ACT inhaler    Sig: Inhale 1-2 puffs into the lungs every 6 (six) hours as needed for wheezing or shortness of breath.    Dispense:  9 g    Refill:  3   Fluticasone-Umeclidin-Vilant (TRELEGY ELLIPTA) 200-62.5-25 MCG/ACT AEPB    Sig: Inhale 1 puff into the lungs daily.    Dispense:  60 each    Refill:  9    Per Dr. Geraldo Pitter okay to change to full inhaler.Kathlene Cote student     Follow up: Telehealth in 1 month; office visit in 3 months for A1c check  We look forward to seeing you next time. Please call our clinic at [phone] if you have any questions or concerns. The best time to call is Monday-Friday from 9am-4pm, but there is someone available 24/7. If after hours or the weekend, call the main hospital number and ask for the Internal Medicine Resident On-Call.  If you need medication refills, please notify your pharmacy one week in advance and they will send [place] a request.   Thank you for trusting me with your care. Wishing you the best!   Thea Alken, MS3

## 2023-06-13 ENCOUNTER — Encounter: Payer: Self-pay | Admitting: Hematology and Oncology

## 2023-06-13 ENCOUNTER — Telehealth: Payer: Self-pay

## 2023-06-13 ENCOUNTER — Other Ambulatory Visit: Payer: Self-pay | Admitting: Student

## 2023-06-13 DIAGNOSIS — E781 Pure hyperglyceridemia: Secondary | ICD-10-CM

## 2023-06-13 LAB — LIPID PANEL
Chol/HDL Ratio: 13.5 {ratio} — ABNORMAL HIGH (ref 0.0–4.4)
Cholesterol, Total: 350 mg/dL — ABNORMAL HIGH (ref 100–199)
HDL: 26 mg/dL — ABNORMAL LOW (ref >39)
Triglycerides: 1246 mg/dL (ref 0–149)

## 2023-06-13 LAB — BMP8+ANION GAP
Anion Gap: 15 mmol/L (ref 10.0–18.0)
BUN/Creatinine Ratio: 18 (ref 12–28)
BUN: 10 mg/dL (ref 8–27)
CO2: 23 mmol/L (ref 20–29)
Calcium: 9.7 mg/dL (ref 8.7–10.3)
Chloride: 94 mmol/L — ABNORMAL LOW (ref 96–106)
Creatinine, Ser: 0.55 mg/dL — ABNORMAL LOW (ref 0.57–1.00)
Glucose: 283 mg/dL — ABNORMAL HIGH (ref 70–99)
Potassium: 4.4 mmol/L (ref 3.5–5.2)
Sodium: 132 mmol/L — ABNORMAL LOW (ref 134–144)
eGFR: 104 mL/min/{1.73_m2} (ref >59)

## 2023-06-13 NOTE — Assessment & Plan Note (Signed)
Patient continues on atorvastatin 80 mg daily but has not been taking her ezetimibe. - Continue atorvastatin 80 mg daily and repeat lipid panel today

## 2023-06-13 NOTE — Assessment & Plan Note (Signed)
Blood pressure slightly elevated today at 144/67 but she has run out of her lisinopril and has previously been well-controlled on this medication.  We will refill this medication and check a BMP today. - Continue lisinopril 5 mg daily and BMP today

## 2023-06-13 NOTE — Assessment & Plan Note (Addendum)
Patient continues to do generally well after axillary femoral bypass earlier this year.  She continues to follow-up with vascular surgery.  She does have some swelling around the graft runs on the right side of her chest and abdomen at the anterior axillary line.  This has been evaluated by vascular surgery and no intervention has been recommended.  She continues on aspirin, Plavix, and high intensity statin.  She is post to be on ezetimibe as well but has not been taking this.  She did not cite any side effects.  We will repeat a lipid panel today to see if this needs to be added back or another medicine needs to be considered. - Continue aspirin 81 mg daily, clopidogrel 75 mg daily, and atorvastatin 80 mg daily - Lipid panel today. - Continue to follow-up with vascular surgery

## 2023-06-13 NOTE — Progress Notes (Signed)
Called and discussed results with patient.  For her very high triglyceride level we will have her come back tomorrow morning for repeat lipid panel and she is instructed to not eat or drink anything except water after midnight.  BMP is largely stable.  Will follow-up on the new lipid panel but she will likely need to restart her Zetia.  She does not have any symptoms of acute pancreatitis at this point.

## 2023-06-13 NOTE — Progress Notes (Signed)
CC: Routine Follow Up   HPI:  Vanessa [name] is a 62 y.o. female with pertinent PMH of T2DM on insulin, HTN, severe PAD s/p right axillary femoral bypass, COPD who presents to the clinic for routine follow-up visit after being seen in our clinic 12/14/2022. Please see assessment and plan below for further details.  Past Medical History:  Diagnosis Date   Asthma    No PFT performed   Blood transfusion without reported diagnosis    has to donate blood due to having "thick" blood.   Complication of anesthesia    Woken up afterwards with panic attacks   COPD (chronic obstructive pulmonary disease) (HCC)    Depression    Diabetes mellitus 2002   GERD (gastroesophageal reflux disease)    Helicobacter pylori (H. pylori) infection    s/p triple therapy   Hepatic hemangioma    History of kidney stones    Hyperlipidemia    Hypertension    Hypertriglyceridemia    Panic attacks    mostly Agaraphobia   Pneumonia    Ventral hernia     Current Outpatient Medications  Medication Instructions   acetaminophen (TYLENOL) 1,000 mg, Oral, 2 times daily PRN   albuterol (VENTOLIN HFA) 108 (90 Base) MCG/ACT inhaler 1-2 puffs, Inhalation, Every 6 hours PRN   amoxicillin-clavulanate (AUGMENTIN) 875-125 MG tablet 1 tablet, Oral, Every 12 hours   aspirin EC 81 mg, Oral, Daily, Swallow whole.   atorvastatin (LIPITOR) 80 mg, Oral, Daily   buPROPion (WELLBUTRIN SR) 150 mg, Oral, 2 times daily   cetirizine (ZYRTEC) 10 mg, Oral, Daily   cimetidine (TAGAMET) 200 mg, Oral, Daily   citalopram (CELEXA) 40 mg, Oral, Daily   clopidogrel (PLAVIX) 75 mg, Oral, Daily   doxycycline (VIBRAMYCIN) 100 mg, Oral, 2 times daily   ezetimibe (ZETIA) 10 mg, Oral, Daily   Fluticasone-Umeclidin-Vilant (TRELEGY ELLIPTA) 200-62.5-25 MCG/ACT AEPB 1 puff, Inhalation, Daily   gabapentin (NEURONTIN) 300 mg, Oral, Daily at bedtime   glucose blood test strip Use as instructed   Insulin Glargine-Lixisenatide (SOLIQUA) 100-33  UNT-MCG/ML SOPN 10 Units, Subcutaneous, Daily   Insulin Pen Needle (PEN NEEDLES) 31G X 5 MM MISC 1 each, Does not apply, Daily, Use to inject insulin   lisinopril (ZESTRIL) 5 mg, Oral, Daily   nicotine (NICODERM CQ - DOSED IN MG/24 HOURS) 21 mg, Transdermal, Daily PRN     Review of Systems:   Pertinent items noted in HPI and/or A&P.  Physical Exam:  Vitals:   06/12/23 1553  BP: (!) 144/67  Pulse: 91  Temp: 98.2 F (36.8 C)  TempSrc: Oral  SpO2: 91%  Weight: 156 lb 0.4 oz (70.8 kg)  Height: 5\' 2"  (1.575 m)    Constitutional: Well-appearing female female. In no acute distress. Cardio:Regular rate and rhythm. 2+ bilateral radial pulses. Pulm: Normal work of breathing on room air. Abdomen: Mild edema, interactive for axillary femoral bypass graft IHK:[id number] for extremity edema. Skin:Warm and dry. Neuro:Alert and oriented x3. No focal deficit noted. Psych:Pleasant mood and affect.   Assessment & Plan:   Essential hypertension, benign Blood pressure slightly elevated today at 144/67 but she has run out of her lisinopril and has previously been well-controlled on this medication.  We will refill this medication and check a BMP today. - Continue lisinopril 5 mg daily and BMP today  Peripheral arterial disease with history of revascularization [facility]) Patient continues to do generally well after axillary femoral bypass earlier this year.  She continues to follow-up with vascular  surgery.  She does have some swelling around the graft runs on the right side of her chest and abdomen at the anterior axillary line.  This has been evaluated by vascular surgery and no intervention has been recommended.  She continues on aspirin, Plavix, and high intensity statin.  She is post to be on ezetimibe as well but has not been taking this.  She did not cite any side effects.  We will repeat a lipid panel today to see if this needs to be added back or another medicine needs to be considered. - Continue  aspirin 81 mg daily, clopidogrel 75 mg daily, and atorvastatin 80 mg daily - Lipid panel today. - Continue to follow-up with vascular surgery  COPD (chronic obstructive pulmonary disease) (HCC) Patient continues to do well on current triple therapy plus as needed albuterol inhaler.  Lungs sound clear on exam and no recent exacerbation.  She is also quit smoking since her hospitalization in May of this year.  She has started vaping but is slowly decreasing the volume. - Continue Trelegy 200/62.5/25 mcg 1 puff daily and albuterol 108 mcg 1-2 puffs every 6 hours as needed  Diabetes mellitus, type II, insulin dependent (HCC) Last A1c from hospitalization in 10/2022 was 9.8 and patient has been on 30 units of Soliqua since hospital follow-up on 12/14/2022.  A1c today is 12.4.  She has not been consistently checking her fasting glucose and did not bring her meter in today.  She has had some high glucose readings around 300 and one low in the 80s which she had symptoms with.  She has tried metformin with significant GI upset, SGLT2 inhibitor with recurrent genitourinary infections, and is currently on insulin/GLP-1.  We discussed increasing her Soliqua versus adding a medication such as glipizide.  After discussion the patient will resume checking her fasting glucose in the morning consistently and titrate her Soliqua by 2 units every 4 days with a goal fasting glucose between 100-180. Lab Results  Component Value Date   HGBA1C 12.4 (A) 06/12/2023   HGBA1C 9.8 (H) 11/22/2022   HGBA1C 10.6 (A) 10/25/2022  -Continue Soliqua 30 units daily and titrate every 4 days by +/- 2 units for goal fasting glucose between 100-180 -Plan for telehealth visit in 1 month to discuss her progress and review  glucose log - Return in 3 months for repeat A1c  Tobacco dependence Patient continues to do well after quitting cigarette smoking following hospitalization in May 2024.  She has started vaping with nicotine products but  is also slowly decreasing the amount she is using.  She continues on Wellbutrin for tobacco use disorder and we again discussed augmenting with other forms of nicotine replacement such as gum and patches.  She will let us know if she would like to use these. - Continue Wellbutrin 150 mg twice daily  HLD (hyperlipidemia) Patient continues on atorvastatin 80 mg daily but has not been taking her ezetimibe. - Continue atorvastatin 80 mg daily and repeat lipid panel today    Patient discussed with Dr. Duwayne Heck, DO [facility] Internal Medicine Resident PGY-2 Clinic Phone: [phone] Pager: [phone]

## 2023-06-13 NOTE — Assessment & Plan Note (Signed)
Patient continues to do well after quitting cigarette smoking following hospitalization in May 2024.  She has started vaping with nicotine products but is also slowly decreasing the amount she is using.  She continues on Wellbutrin for tobacco use disorder and we again discussed augmenting with other forms of nicotine replacement such as gum and patches.  She will let us know if she would like to use these. - Continue Wellbutrin 150 mg twice daily

## 2023-06-13 NOTE — Telephone Encounter (Signed)
Prior Authorization for patient (Trelegy Ellipta 200-62.5-25MCG/ACT aerosol powder) came through on cover my meds was submitted with last office notes awaiting approval or denial.  KEY:[id number]

## 2023-06-13 NOTE — Assessment & Plan Note (Signed)
Last A1c from hospitalization in 10/2022 was 9.8 and patient has been on 30 units of Soliqua since hospital follow-up on 12/14/2022.  A1c today is 12.4.  She has not been consistently checking her fasting glucose and did not bring her meter in today.  She has had some high glucose readings around 300 and one low in the 80s which she had symptoms with.  She has tried metformin with significant GI upset, SGLT2 inhibitor with recurrent genitourinary infections, and is currently on insulin/GLP-1.  We discussed increasing her Soliqua versus adding a medication such as glipizide.  After discussion the patient will resume checking her fasting glucose in the morning consistently and titrate her Soliqua by 2 units every 4 days with a goal fasting glucose between 100-180. Lab Results  Component Value Date   HGBA1C 12.4 (A) 06/12/2023   HGBA1C 9.8 (H) 11/22/2022   HGBA1C 10.6 (A) 10/25/2022  -Continue Soliqua 30 units daily and titrate every 4 days by +/- 2 units for goal fasting glucose between 100-180 -Plan for telehealth visit in 1 month to discuss her progress and review  glucose log - Return in 3 months for repeat A1c

## 2023-06-13 NOTE — Assessment & Plan Note (Addendum)
Patient continues to do well on current triple therapy plus as needed albuterol inhaler.  Lungs sound clear on exam and no recent exacerbation.  She is also quit smoking since her hospitalization in May of this year.  She has started vaping but is slowly decreasing the volume. - Continue Trelegy 200/62.5/25 mcg 1 puff daily and albuterol 108 mcg 1-2 puffs every 6 hours as needed

## 2023-06-14 ENCOUNTER — Other Ambulatory Visit (HOSPITAL_COMMUNITY): Payer: Self-pay

## 2023-06-14 ENCOUNTER — Encounter: Payer: Self-pay | Admitting: Hematology and Oncology

## 2023-06-14 NOTE — Progress Notes (Signed)
Internal Medicine Clinic Attending  Case discussed with the resident at the time of the visit.  We reviewed the resident's history and exam and pertinent patient test results.  I agree with the assessment, diagnosis, and plan of care documented in the resident's note.    I share Dr Driscilla Grammes concerns about her high TG's Will repeat fasting labs and then determine final plan for mgmt

## 2023-06-18 ENCOUNTER — Ambulatory Visit: Payer: Medicaid Other

## 2023-06-18 NOTE — Progress Notes (Deleted)
HISTORY AND PHYSICAL     CC:  follow up. Requesting Provider:  Rocky Morel, DO  HPI: This is a 62 y.o. female who is here today for follow up for PAD.  Pt has hx of right axillary artery to CFA bypass with 8 mm ringed PTFE on 11/29/2022 for right foot ulcer by Dr. Myra Gianotti.   She subsequently underwent right 4th and 5th toe amputations by Dr. Annamary Rummage with Podiatry.  When she was seen in July, she was found to have a fluid collection surrounding her bypass graft along the lower half and no signs of infection.    Pt was last seen 05/28/2023 and at that time, she had a left medial ankle wound that was traumatic after hitting it on furniture.  She was cleaning it with soap and water and applying betadine.  She felt it was getting smaller.  Pt felt the fluid collection was unchanged and not draining.  She was not having any claudication or rest pain.  She was scheduled for a 2 week follow up when Dr. Myra Gianotti in the office.   The pt returns today for follow up.  ***  The pt is on a statin for cholesterol management.    The pt is on an aspirin.    Other AC:  Plavix The pt is on ACEI for hypertension.  The pt is  on medication for diabetes. Tobacco hx:  former  Pt does *** have family hx of AAA.  Past Medical History:  Diagnosis Date   Asthma    No PFT performed   Blood transfusion without reported diagnosis    has to donate blood due to having "thick" blood.   Complication of anesthesia    Woken up afterwards with panic attacks   COPD (chronic obstructive pulmonary disease) (HCC)    Depression    Diabetes mellitus 2002   GERD (gastroesophageal reflux disease)    Helicobacter pylori (H. pylori) infection    s/p triple therapy   Hepatic hemangioma    History of kidney stones    Hyperlipidemia    Hypertension    Hypertriglyceridemia    Panic attacks    mostly Agaraphobia   Pneumonia    Ventral hernia     Past Surgical History:  Procedure Laterality Date   ABDOMINAL  AORTOGRAM W/LOWER EXTREMITY N/A 11/24/2022   Procedure: ABDOMINAL AORTOGRAM W/LOWER EXTREMITY;  Surgeon: Leonie Douglas, MD;  Location: [facility];  Service: Cardiovascular;  Laterality: N/A;   AMPUTATION Right 12/02/2022   Procedure: RAY AMPUTATION OF  RIGHT 5TH & 4TH TOES;  Surgeon: Pilar Plate, DPM;  Location: [facility];  Service: Podiatry;  Laterality: Right;   APPLICATION OF WOUND VAC Right 11/29/2022   Procedure: APPLICATION OF WOUND VAC AND KERECIS MICRO WOUND GRAFT;  Surgeon: Nada Libman, MD;  Location: [facility];  Service: Vascular;  Laterality: Right;   AXILLARY-FEMORAL BYPASS GRAFT Right 11/29/2022   Procedure: RIGHT AXILLA ARTERY - RIGHT FEMORAL ARTERY BYPASS GRAFT USING 8mm X 70cm PROPATEN GORE GRAFT WITH REMOVABLE RINGS;  Surgeon: Nada Libman, MD;  Location: [facility];  Service: Vascular;  Laterality: Right;   CESAREAN SECTION     with 3 children   COLONOSCOPY  2018   OVARIAN CYST REMOVAL     Age 85   ROBOTIC ASSISTED BILATERAL SALPINGO OOPHERECTOMY Bilateral 11/23/2021   Procedure: XI ROBOTIC ASSISTED BILATERAL SALPINGO OOPHORECTOMY,  MINI LAPAROTOMY;  Surgeon: Carver Fila, MD;  Location: [facility];  Service: Gynecology;  Laterality: Bilateral;   ROBOTIC ASSISTED TOTAL HYSTERECTOMY N/A 11/23/2021   Procedure: XI ROBOTIC ASSISTED TOTAL HYSTERECTOMY, STAGING, CYSTOSCOPY;  Surgeon: Carver Fila, MD;  Location: [facility];  Service: Gynecology;  Laterality: N/A;   TUBAL LIGATION     UPPER GASTROINTESTINAL ENDOSCOPY  2018    Allergies  Allergen Reactions   Jardiance [Empagliflozin] Other (See Comments)    Severe yeast infection   Latex Rash   Avandia [Rosiglitazone] Other (See Comments)    Headaches     Current Outpatient Medications  Medication Sig Dispense Refill   acetaminophen (TYLENOL) 500 MG tablet Take 1,000 mg by mouth 2 (two) times daily as needed for mild pain or moderate pain.     albuterol (VENTOLIN HFA) 108 (90 Base) MCG/ACT inhaler Inhale  1-2 puffs into the lungs every 6 (six) hours as needed for wheezing or shortness of breath. 9 g 3   amoxicillin-clavulanate (AUGMENTIN) 875-125 MG tablet Take 1 tablet by mouth every 12 (twelve) hours.     aspirin EC 81 MG tablet Take 1 tablet (81 mg total) by mouth daily at 6 (six) AM. Swallow whole. 30 tablet 12   atorvastatin (LIPITOR) 80 MG tablet Take 1 tablet (80 mg total) by mouth daily. 90 tablet 2   buPROPion (WELLBUTRIN SR) 150 MG 12 hr tablet Take 1 tablet (150 mg total) by mouth 2 (two) times daily. 60 tablet 2   cetirizine (ZYRTEC) 10 MG tablet Take 1 tablet (10 mg total) by mouth daily. 90 tablet 3   cimetidine (TAGAMET) 200 MG tablet Take 200 mg by mouth daily.     citalopram (CELEXA) 40 MG tablet Take 1 tablet (40 mg total) by mouth daily. 90 tablet 3   clopidogrel (PLAVIX) 75 MG tablet Take 1 tablet (75 mg total) by mouth daily. 30 tablet 6   doxycycline (VIBRAMYCIN) 100 MG capsule Take 1 capsule (100 mg total) by mouth 2 (two) times daily. 28 capsule 0   ezetimibe (ZETIA) 10 MG tablet Take 1 tablet (10 mg total) by mouth daily. 30 tablet 3   Fluticasone-Umeclidin-Vilant (TRELEGY ELLIPTA) 200-62.5-25 MCG/ACT AEPB Inhale 1 puff into the lungs daily. 60 each 9   gabapentin (NEURONTIN) 300 MG capsule Take 1 capsule (300 mg total) by mouth at bedtime. 30 capsule 2   glucose blood test strip Use as instructed 100 each 12   Insulin Glargine-Lixisenatide (SOLIQUA) 100-33 UNT-MCG/ML SOPN Inject 10 Units into the skin daily. 3 mL 11   Insulin Pen Needle (PEN NEEDLES) 31G X 5 MM MISC 1 each by Does not apply route daily. Use to inject insulin 100 each 0   lisinopril (ZESTRIL) 5 MG tablet Take 1 tablet (5 mg total) by mouth daily. 30 tablet 0   nicotine (NICODERM CQ - DOSED IN MG/24 HOURS) 21 mg/24hr patch Place 1 patch (21 mg total) onto the skin daily as needed (tobacco cravings). 28 patch 0   No current facility-administered medications for this visit.    Family History  Problem  Relation Age of Onset   Colon polyps Mother        benign   Ovarian cancer Mother 16   Breast cancer Maternal Aunt        dx late 30s   Diabetes Maternal Uncle    Colon cancer Neg Hx    Stomach cancer Neg Hx    Endometrial cancer Neg Hx    Prostate cancer Neg Hx    Pancreatic cancer Neg Hx    Esophageal cancer Neg  Hx    Rectal cancer Neg Hx     Social History   Socioeconomic History   Marital status: Married    Spouse name: Not on file   Number of children: Not on file   Years of education: Not on file   Highest education level: Not on file  Occupational History   Not on file  Tobacco Use   Smoking status: Former    Current packs/day: 1.00    Average packs/day: 1 pack/day for 43.0 years (43.0 ttl pk-yrs)    Types: Cigarettes   Smokeless tobacco: Never   Tobacco comments:    Wants Wellbutrin   Vaping Use   Vaping status: Every Day   Substances: Nicotine  Substance and Sexual Activity   Alcohol use: No    Alcohol/week: 0.0 standard drinks of alcohol    Comment: none   Drug use: No   Sexual activity: Not Currently  Other Topics Concern   Not on file  Social History Narrative   Drinks at least a pot of coffee every day.    Social Drivers of Corporate investment banker Strain: Not on file  Food Insecurity: No Food Insecurity (11/22/2022)   Hunger Vital Sign    Worried About Running Out of Food in the Last Year: Never true    Ran Out of Food in the Last Year: Never true  Transportation Needs: No Transportation Needs (11/22/2022)   PRAPARE - Administrator, Civil Service (Medical): No    Lack of Transportation (Non-Medical): No  Physical Activity: Not on file  Stress: Not on file  Social Connections: Moderately Isolated (10/25/2022)   Social Connection and Isolation Panel [NHANES]    Frequency of Communication with Friends and Family: More than three times a week    Frequency of Social Gatherings with Friends and Family: More than three times a week     Attends Religious Services: Never    Database administrator or Organizations: No    Attends Banker Meetings: Never    Marital Status: Married  Catering manager Violence: Not At Risk (11/22/2022)   Humiliation, Afraid, Rape, and Kick questionnaire    Fear of Current or Ex-Partner: No    Emotionally Abused: No    Physically Abused: No    Sexually Abused: No     REVIEW OF SYSTEMS:  *** [X]  denotes positive finding, [ ]  denotes negative finding Cardiac  Comments:  Chest pain or chest pressure:    Shortness of breath upon exertion:    Short of breath when lying flat:    Irregular heart rhythm:        Vascular    Pain in calf, thigh, or hip brought on by ambulation:    Pain in feet at night that wakes you up from your sleep:     Blood clot in your veins:    Leg swelling:         Pulmonary    Oxygen at home:    Productive cough:     Wheezing:         Neurologic    Sudden weakness in arms or legs:     Sudden numbness in arms or legs:     Sudden onset of difficulty speaking or slurred speech:    Temporary loss of vision in one eye:     Problems with dizziness:         Gastrointestinal    Blood in stool:  Vomited blood:         Genitourinary    Burning when urinating:     Blood in urine:        Psychiatric    Major depression:         Hematologic    Bleeding problems:    Problems with blood clotting too easily:        Skin    Rashes or ulcers:        Constitutional    Fever or chills:      PHYSICAL EXAMINATION:  ***  General:  WDWN in NAD; vital signs documented above Gait: Not observed HENT: WNL, normocephalic Pulmonary: normal non-labored breathing , without wheezing Cardiac: {Desc; regular/irreg:14544} HR, {With/Without:20273} carotid bruit*** Abdomen: soft, NT; aortic pulse is *** palpable Skin: {With/Without:20273} rashes Vascular Exam/Pulses:  Right Left  Radial {Exam; arterial pulse strength 0-4:30167} {Exam; arterial pulse  strength 0-4:30167}  Femoral {Exam; arterial pulse strength 0-4:30167} {Exam; arterial pulse strength 0-4:30167}  Popliteal {Exam; arterial pulse strength 0-4:30167} {Exam; arterial pulse strength 0-4:30167}  DP {Exam; arterial pulse strength 0-4:30167} {Exam; arterial pulse strength 0-4:30167}  PT {Exam; arterial pulse strength 0-4:30167} {Exam; arterial pulse strength 0-4:30167}  Peroneal *** ***   Extremities: {With/Without:20273} ischemic changes, {With/Without:20273} Gangrene , {With/Without:20273} cellulitis; {With/Without:20273} open wounds Musculoskeletal: no muscle wasting or atrophy  Neurologic: A&O X 3 Psychiatric:  The pt has {Desc; normal/abnormal:11317::"Normal"} affect.   Non-Invasive Vascular Imaging:   ABI's/TBI's on ***: Right:  *** - Great toe pressure: *** Left:  *** - Great toe pressure: ***  Arterial duplex on ***: ***  Previous ABI's/TBI's on ***: Right:  *** - Great toe pressure: *** Left:  *** - Great toe pressure:  ***  Previous arterial duplex on ***: ***    ASSESSMENT/PLAN:: 62 y.o. female here for follow up for PAD with hx of right axillary artery to CFA bypass with 8 mm ringed PTFE on 11/29/2022 for right foot ulcer by Dr. Myra Gianotti.   She subsequently underwent right 4th and 5th toe amputations by Dr. Annamary Rummage with Podiatry.  When she was seen in July, she was found to have a fluid collection surrounding her bypass graft along the lower half and no signs of infection.   -*** -continue asa/statin/plavix -pt will f/u in *** with ***.   Doreatha Massed, [facility] [phone]  Clinic MD:   Myra Gianotti

## 2023-06-22 NOTE — Progress Notes (Signed)
Called the patient again today to see if she would come in for her fasting lipid panel.  She is in the emergency department with her husband who is being admitted at [facility].  Unfortunately she did eat this morning so she will plan to come to the clinic for fasting lipid panel on Friday, 06/29/2023.

## 2023-07-06 ENCOUNTER — Other Ambulatory Visit: Payer: Self-pay

## 2023-07-06 DIAGNOSIS — J449 Chronic obstructive pulmonary disease, unspecified: Secondary | ICD-10-CM

## 2023-07-06 MED ORDER — ALBUTEROL SULFATE HFA 108 (90 BASE) MCG/ACT IN AERS: 1.0000 | INHALATION_SPRAY | Freq: Four times a day (QID) | RESPIRATORY_TRACT | 3 refills | Status: DC | PRN

## 2023-07-16 ENCOUNTER — Ambulatory Visit: Payer: Medicaid Other

## 2023-07-23 ENCOUNTER — Encounter: Payer: Medicaid Other | Admitting: Student

## 2023-07-30 ENCOUNTER — Ambulatory Visit: Payer: Medicaid Other | Admitting: Physician Assistant

## 2023-07-30 VITALS — BP 161/77 | HR 94 | Temp 98.2°F | Resp 20 | Ht 62.0 in | Wt 154.4 lb

## 2023-07-30 DIAGNOSIS — I7025 Atherosclerosis of native arteries of other extremities with ulceration: Secondary | ICD-10-CM | POA: Diagnosis not present

## 2023-07-30 DIAGNOSIS — I70213 Atherosclerosis of native arteries of extremities with intermittent claudication, bilateral legs: Secondary | ICD-10-CM

## 2023-07-30 DIAGNOSIS — S301XXD Contusion of abdominal wall, subsequent encounter: Secondary | ICD-10-CM | POA: Diagnosis not present

## 2023-07-30 NOTE — Progress Notes (Signed)
Office Note     CC:  follow up Requesting Provider:  Rocky Morel, DO  HPI: Vanessa [name] is a 63 y.o. (1961/01/02) female who presents for evaluation of left lower extremity wound.  Left medial ankle wound has been present for almost 3 months now without significant signs of healing.  She denies any significant claudication or rest pain.  She takes care of her husband who is also a patient of our office.  She is on aspirin and Plavix daily.  Surgical history also significant for right axillary to common femoral bypass by Dr. Myra Gianotti on 11/29/2022.  She continues to have fluid surrounding her bypass through her flank and right groin however denies any drainage or tissue loss along the tunneled tract.  She has since healed her fourth and fifth toe amputation which was performed by podiatry after axillofemoral bypass.   Past Medical History:  Diagnosis Date   Asthma    No PFT performed   Blood transfusion without reported diagnosis    has to donate blood due to having "thick" blood.   Complication of anesthesia    Woken up afterwards with panic attacks   COPD (chronic obstructive pulmonary disease) (HCC)    Depression    Diabetes mellitus 2002   GERD (gastroesophageal reflux disease)    Helicobacter pylori (H. pylori) infection    s/p triple therapy   Hepatic hemangioma    History of kidney stones    Hyperlipidemia    Hypertension    Hypertriglyceridemia    Panic attacks    mostly Agaraphobia   Peripheral arterial disease (HCC)    Peripheral vascular disease (HCC)    Pneumonia    Ventral hernia     Past Surgical History:  Procedure Laterality Date   ABDOMINAL AORTOGRAM W/LOWER EXTREMITY N/A 11/24/2022   Procedure: ABDOMINAL AORTOGRAM W/LOWER EXTREMITY;  Surgeon: Leonie Douglas, MD;  Location: [facility];  Service: Cardiovascular;  Laterality: N/A;   AMPUTATION Right 12/02/2022   Procedure: RAY AMPUTATION OF  RIGHT 5TH & 4TH TOES;  Surgeon: Pilar Plate, DPM;   Location: [facility];  Service: Podiatry;  Laterality: Right;   APPLICATION OF WOUND VAC Right 11/29/2022   Procedure: APPLICATION OF WOUND VAC AND KERECIS MICRO WOUND GRAFT;  Surgeon: Nada Libman, MD;  Location: [facility];  Service: Vascular;  Laterality: Right;   AXILLARY-FEMORAL BYPASS GRAFT Right 11/29/2022   Procedure: RIGHT AXILLA ARTERY - RIGHT FEMORAL ARTERY BYPASS GRAFT USING 8mm X 70cm PROPATEN GORE GRAFT WITH REMOVABLE RINGS;  Surgeon: Nada Libman, MD;  Location: [facility];  Service: Vascular;  Laterality: Right;   CESAREAN SECTION     with 3 children   COLONOSCOPY  2018   OVARIAN CYST REMOVAL     Age 2   ROBOTIC ASSISTED BILATERAL SALPINGO OOPHERECTOMY Bilateral 11/23/2021   Procedure: XI ROBOTIC ASSISTED BILATERAL SALPINGO OOPHORECTOMY,  MINI LAPAROTOMY;  Surgeon: Carver Fila, MD;  Location: [facility];  Service: Gynecology;  Laterality: Bilateral;   ROBOTIC ASSISTED TOTAL HYSTERECTOMY N/A 11/23/2021   Procedure: XI ROBOTIC ASSISTED TOTAL HYSTERECTOMY, STAGING, CYSTOSCOPY;  Surgeon: Carver Fila, MD;  Location: [facility];  Service: Gynecology;  Laterality: N/A;   TUBAL LIGATION     UPPER GASTROINTESTINAL ENDOSCOPY  2018    Social History   Socioeconomic History   Marital status: Married    Spouse name: Not on file   Number of children: Not on file   Years of education: Not on file  Highest education level: Not on file  Occupational History   Not on file  Tobacco Use   Smoking status: Former    Current packs/day: 1.00    Average packs/day: 1 pack/day for 43.0 years (43.0 ttl pk-yrs)    Types: Cigarettes   Smokeless tobacco: Never   Tobacco comments:    Wants Wellbutrin   Vaping Use   Vaping status: Every Day   Substances: Nicotine  Substance and Sexual Activity   Alcohol use: No    Alcohol/week: 0.0 standard drinks of alcohol    Comment: none   Drug use: No   Sexual activity: Not Currently  Other Topics Concern   Not on file  Social History Narrative    Drinks at least a pot of coffee every day.    Social Drivers of Corporate investment banker Strain: Not on file  Food Insecurity: No Food Insecurity (11/22/2022)   Hunger Vital Sign    Worried About Running Out of Food in the Last Year: Never true    Ran Out of Food in the Last Year: Never true  Transportation Needs: No Transportation Needs (11/22/2022)   PRAPARE - Administrator, Civil Service (Medical): No    Lack of Transportation (Non-Medical): No  Physical Activity: Not on file  Stress: Not on file  Social Connections: Moderately Isolated (10/25/2022)   Social Connection and Isolation Panel [NHANES]    Frequency of Communication with Friends and Family: More than three times a week    Frequency of Social Gatherings with Friends and Family: More than three times a week    Attends Religious Services: Never    Database administrator or Organizations: No    Attends Banker Meetings: Never    Marital Status: Married  Catering manager Violence: Not At Risk (11/22/2022)   Humiliation, Afraid, Rape, and Kick questionnaire    Fear of Current or Ex-Partner: No    Emotionally Abused: No    Physically Abused: No    Sexually Abused: No    Family History  Problem Relation Age of Onset   Colon polyps Mother        benign   Ovarian cancer Mother 71   Breast cancer Maternal Aunt        dx late 57s   Diabetes Maternal Uncle    Colon cancer Neg Hx    Stomach cancer Neg Hx    Endometrial cancer Neg Hx    Prostate cancer Neg Hx    Pancreatic cancer Neg Hx    Esophageal cancer Neg Hx    Rectal cancer Neg Hx     Current Outpatient Medications  Medication Sig Dispense Refill   acetaminophen (TYLENOL) 500 MG tablet Take 1,000 mg by mouth 2 (two) times daily as needed for mild pain or moderate pain.     albuterol (VENTOLIN HFA) 108 (90 Base) MCG/ACT inhaler Inhale 1-2 puffs into the lungs every 6 (six) hours as needed for wheezing or shortness of breath. 9 g 3    amoxicillin-clavulanate (AUGMENTIN) 875-125 MG tablet Take 1 tablet by mouth every 12 (twelve) hours.     aspirin EC 81 MG tablet Take 1 tablet (81 mg total) by mouth daily at 6 (six) AM. Swallow whole. 30 tablet 12   buPROPion (WELLBUTRIN SR) 150 MG 12 hr tablet Take 1 tablet (150 mg total) by mouth 2 (two) times daily. 60 tablet 2   cetirizine (ZYRTEC) 10 MG tablet Take 1 tablet (10 mg  total) by mouth daily. 90 tablet 3   cimetidine (TAGAMET) 200 MG tablet Take 200 mg by mouth daily.     citalopram (CELEXA) 40 MG tablet Take 1 tablet (40 mg total) by mouth daily. 90 tablet 3   clopidogrel (PLAVIX) 75 MG tablet Take 1 tablet (75 mg total) by mouth daily. 30 tablet 6   doxycycline (VIBRAMYCIN) 100 MG capsule Take 1 capsule (100 mg total) by mouth 2 (two) times daily. 28 capsule 0   ezetimibe (ZETIA) 10 MG tablet Take 1 tablet (10 mg total) by mouth daily. 30 tablet 3   Fluticasone-Umeclidin-Vilant (TRELEGY ELLIPTA) 200-62.5-25 MCG/ACT AEPB Inhale 1 puff into the lungs daily. 60 each 9   glucose blood test strip Use as instructed 100 each 12   Insulin Glargine-Lixisenatide (SOLIQUA) 100-33 UNT-MCG/ML SOPN Inject 10 Units into the skin daily. 3 mL 11   Insulin Pen Needle (PEN NEEDLES) 31G X 5 MM MISC 1 each by Does not apply route daily. Use to inject insulin 100 each 0   lisinopril (ZESTRIL) 5 MG tablet Take 1 tablet (5 mg total) by mouth daily. 30 tablet 0   nicotine (NICODERM CQ - DOSED IN MG/24 HOURS) 21 mg/24hr patch Place 1 patch (21 mg total) onto the skin daily as needed (tobacco cravings). 28 patch 0   atorvastatin (LIPITOR) 80 MG tablet Take 1 tablet (80 mg total) by mouth daily. 90 tablet 2   gabapentin (NEURONTIN) 300 MG capsule Take 1 capsule (300 mg total) by mouth at bedtime. 30 capsule 2   No current facility-administered medications for this visit.    Allergies  Allergen Reactions   Jardiance [Empagliflozin] Other (See Comments)    Severe yeast infection   Latex Rash    Avandia [Rosiglitazone] Other (See Comments)    Headaches      REVIEW OF SYSTEMS:   [X]  denotes positive finding, [ ]  denotes negative finding Cardiac  Comments:  Chest pain or chest pressure:    Shortness of breath upon exertion:    Short of breath when lying flat:    Irregular heart rhythm:        Vascular    Pain in calf, thigh, or hip brought on by ambulation:    Pain in feet at night that wakes you up from your sleep:     Blood clot in your veins:    Leg swelling:         Pulmonary    Oxygen at home:    Productive cough:     Wheezing:         Neurologic    Sudden weakness in arms or legs:     Sudden numbness in arms or legs:     Sudden onset of difficulty speaking or slurred speech:    Temporary loss of vision in one eye:     Problems with dizziness:         Gastrointestinal    Blood in stool:     Vomited blood:         Genitourinary    Burning when urinating:     Blood in urine:        Psychiatric    Major depression:         Hematologic    Bleeding problems:    Problems with blood clotting too easily:        Skin    Rashes or ulcers:        Constitutional    Fever or chills:  PHYSICAL EXAMINATION:  Vitals:   07/30/23 0841  BP: (!) 161/77  Pulse: 94  Resp: 20  Temp: 98.2 F (36.8 C)  TempSrc: Temporal  SpO2: 90%  Weight: 154 lb 6.4 oz (70 kg)  Height: 5\' 2"  (1.575 m)    General:  WDWN in NAD; vital signs documented above Gait: Not observed HENT: WNL, normocephalic Pulmonary: normal non-labored breathing , without Rales, rhonchi,  wheezing Cardiac: regular HR Abdomen: soft, NT, no masses Skin: without rashes Vascular Exam/Pulses: Right lower extremity edema however patient has a multiphasic PT signal in the right ankle; also with brisk right DP signal; left PT signal by Doppler Extremities: Fluid surrounding graft throughout the flank and right groin without drainage or erythema; left ankle wound pictured below Musculoskeletal:  no muscle wasting or atrophy  Neurologic: A&O X 3 Psychiatric:  The pt has Normal affect.     ASSESSMENT/PLAN:: 63 y.o. female here for follow up for reevaluation of left ankle wound  Right foot well-perfused with brisk Doppler flow in the PT and DP position.  She continues to have fluid surrounding her axillofemoral bypass graft however no sign of infection.  She is aware of the infection risk with aspiration or evacuation of the fluid.  She has now had a left medial ankle wound for the past 3 months with no significant progress made towards healing.  She has also been through several rounds of antibiotics.  Plan is to proceed with left lower extremity angiography via left common femoral artery with focus on the left iliac artery system.  This will be scheduled with Dr. Myra Gianotti in the next few weeks.  She needs to coordinate care for her husband around the timing of this procedure.  She will continue her aspirin and Plavix perioperatively.  Her phone number was recorded and our schedulers will contact her with a surgery date in the near future.   [name], PA-C [facility] [phone]  Clinic MD:   Myra Gianotti

## 2023-07-30 NOTE — H&P (View-Only) (Signed)
Office Note     CC:  follow up Requesting Provider:  Rocky Morel, DO  HPI: Vanessa [name] is a 63 y.o. ([birth year]) female who presents for evaluation of left lower extremity wound.  Left medial ankle wound has been present for almost 3 months now without significant signs of healing.  She denies any significant claudication or rest pain.  She takes care of her husband who is also a patient of our office.  She is on aspirin and Plavix daily.  Surgical history also significant for right axillary to common femoral bypass by Dr. Myra Gianotti on 11/29/2022.  She continues to have fluid surrounding her bypass through her flank and right groin however denies any drainage or tissue loss along the tunneled tract.  She has since healed her fourth and fifth toe amputation which was performed by podiatry after axillofemoral bypass.   Past Medical History:  Diagnosis Date   Asthma    No PFT performed   Blood transfusion without reported diagnosis    has to donate blood due to having "thick" blood.   Complication of anesthesia    Woken up afterwards with panic attacks   COPD (chronic obstructive pulmonary disease) (HCC)    Depression    Diabetes mellitus 2002   GERD (gastroesophageal reflux disease)    Helicobacter pylori (H. pylori) infection    s/p triple therapy   Hepatic hemangioma    History of kidney stones    Hyperlipidemia    Hypertension    Hypertriglyceridemia    Panic attacks    mostly Agaraphobia   Peripheral arterial disease (HCC)    Peripheral vascular disease (HCC)    Pneumonia    Ventral hernia     Past Surgical History:  Procedure Laterality Date   ABDOMINAL AORTOGRAM W/LOWER EXTREMITY N/A 11/24/2022   Procedure: ABDOMINAL AORTOGRAM W/LOWER EXTREMITY;  Surgeon: Leonie Douglas, MD;  Location: [facility];  Service: Cardiovascular;  Laterality: N/A;   AMPUTATION Right 12/02/2022   Procedure: RAY AMPUTATION OF  RIGHT 5TH & 4TH TOES;  Surgeon: Pilar Plate, DPM;   Location: [facility];  Service: Podiatry;  Laterality: Right;   APPLICATION OF WOUND VAC Right 11/29/2022   Procedure: APPLICATION OF WOUND VAC AND KERECIS MICRO WOUND GRAFT;  Surgeon: Nada Libman, MD;  Location: [facility];  Service: Vascular;  Laterality: Right;   AXILLARY-FEMORAL BYPASS GRAFT Right 11/29/2022   Procedure: RIGHT AXILLA ARTERY - RIGHT FEMORAL ARTERY BYPASS GRAFT USING 8mm X 70cm PROPATEN GORE GRAFT WITH REMOVABLE RINGS;  Surgeon: Nada Libman, MD;  Location: [facility];  Service: Vascular;  Laterality: Right;   CESAREAN SECTION     with 3 children   COLONOSCOPY  2018   OVARIAN CYST REMOVAL     Age 78   ROBOTIC ASSISTED BILATERAL SALPINGO OOPHERECTOMY Bilateral 11/23/2021   Procedure: XI ROBOTIC ASSISTED BILATERAL SALPINGO OOPHORECTOMY,  MINI LAPAROTOMY;  Surgeon: Carver Fila, MD;  Location: [facility];  Service: Gynecology;  Laterality: Bilateral;   ROBOTIC ASSISTED TOTAL HYSTERECTOMY N/A 11/23/2021   Procedure: XI ROBOTIC ASSISTED TOTAL HYSTERECTOMY, STAGING, CYSTOSCOPY;  Surgeon: Carver Fila, MD;  Location: [facility];  Service: Gynecology;  Laterality: N/A;   TUBAL LIGATION     UPPER GASTROINTESTINAL ENDOSCOPY  2018    Social History   Socioeconomic History   Marital status: Married    Spouse name: Not on file   Number of children: Not on file   Years of education: Not on file  Highest education level: Not on file  Occupational History   Not on file  Tobacco Use   Smoking status: Former    Current packs/day: 1.00    Average packs/day: 1 pack/day for 43.0 years (43.0 ttl pk-yrs)    Types: Cigarettes   Smokeless tobacco: Never   Tobacco comments:    Wants Wellbutrin   Vaping Use   Vaping status: Every Day   Substances: Nicotine  Substance and Sexual Activity   Alcohol use: No    Alcohol/week: 0.0 standard drinks of alcohol    Comment: none   Drug use: No   Sexual activity: Not Currently  Other Topics Concern   Not on file  Social History Narrative    Drinks at least a pot of coffee every day.    Social Drivers of Corporate investment banker Strain: Not on file  Food Insecurity: No Food Insecurity (11/22/2022)   Hunger Vital Sign    Worried About Running Out of Food in the Last Year: Never true    Ran Out of Food in the Last Year: Never true  Transportation Needs: No Transportation Needs (11/22/2022)   PRAPARE - Administrator, Civil Service (Medical): No    Lack of Transportation (Non-Medical): No  Physical Activity: Not on file  Stress: Not on file  Social Connections: Moderately Isolated (10/25/2022)   Social Connection and Isolation Panel [NHANES]    Frequency of Communication with Friends and Family: More than three times a week    Frequency of Social Gatherings with Friends and Family: More than three times a week    Attends Religious Services: Never    Database administrator or Organizations: No    Attends Banker Meetings: Never    Marital Status: Married  Catering manager Violence: Not At Risk (11/22/2022)   Humiliation, Afraid, Rape, and Kick questionnaire    Fear of Current or Ex-Partner: No    Emotionally Abused: No    Physically Abused: No    Sexually Abused: No    Family History  Problem Relation Age of Onset   Colon polyps Mother        benign   Ovarian cancer Mother 68   Breast cancer Maternal Aunt        dx late 49s   Diabetes Maternal Uncle    Colon cancer Neg Hx    Stomach cancer Neg Hx    Endometrial cancer Neg Hx    Prostate cancer Neg Hx    Pancreatic cancer Neg Hx    Esophageal cancer Neg Hx    Rectal cancer Neg Hx     Current Outpatient Medications  Medication Sig Dispense Refill   acetaminophen (TYLENOL) 500 MG tablet Take 1,000 mg by mouth 2 (two) times daily as needed for mild pain or moderate pain.     albuterol (VENTOLIN HFA) 108 (90 Base) MCG/ACT inhaler Inhale 1-2 puffs into the lungs every 6 (six) hours as needed for wheezing or shortness of breath. 9 g 3    amoxicillin-clavulanate (AUGMENTIN) 875-125 MG tablet Take 1 tablet by mouth every 12 (twelve) hours.     aspirin EC 81 MG tablet Take 1 tablet (81 mg total) by mouth daily at 6 (six) AM. Swallow whole. 30 tablet 12   buPROPion (WELLBUTRIN SR) 150 MG 12 hr tablet Take 1 tablet (150 mg total) by mouth 2 (two) times daily. 60 tablet 2   cetirizine (ZYRTEC) 10 MG tablet Take 1 tablet (10 mg  total) by mouth daily. 90 tablet 3   cimetidine (TAGAMET) 200 MG tablet Take 200 mg by mouth daily.     citalopram (CELEXA) 40 MG tablet Take 1 tablet (40 mg total) by mouth daily. 90 tablet 3   clopidogrel (PLAVIX) 75 MG tablet Take 1 tablet (75 mg total) by mouth daily. 30 tablet 6   doxycycline (VIBRAMYCIN) 100 MG capsule Take 1 capsule (100 mg total) by mouth 2 (two) times daily. 28 capsule 0   ezetimibe (ZETIA) 10 MG tablet Take 1 tablet (10 mg total) by mouth daily. 30 tablet 3   Fluticasone-Umeclidin-Vilant (TRELEGY ELLIPTA) 200-62.5-25 MCG/ACT AEPB Inhale 1 puff into the lungs daily. 60 each 9   glucose blood test strip Use as instructed 100 each 12   Insulin Glargine-Lixisenatide (SOLIQUA) 100-33 UNT-MCG/ML SOPN Inject 10 Units into the skin daily. 3 mL 11   Insulin Pen Needle (PEN NEEDLES) 31G X 5 MM MISC 1 each by Does not apply route daily. Use to inject insulin 100 each 0   lisinopril (ZESTRIL) 5 MG tablet Take 1 tablet (5 mg total) by mouth daily. 30 tablet 0   nicotine (NICODERM CQ - DOSED IN MG/24 HOURS) 21 mg/24hr patch Place 1 patch (21 mg total) onto the skin daily as needed (tobacco cravings). 28 patch 0   atorvastatin (LIPITOR) 80 MG tablet Take 1 tablet (80 mg total) by mouth daily. 90 tablet 2   gabapentin (NEURONTIN) 300 MG capsule Take 1 capsule (300 mg total) by mouth at bedtime. 30 capsule 2   No current facility-administered medications for this visit.    Allergies  Allergen Reactions   Jardiance [Empagliflozin] Other (See Comments)    Severe yeast infection   Latex Rash    Avandia [Rosiglitazone] Other (See Comments)    Headaches      REVIEW OF SYSTEMS:   [X]  denotes positive finding, [ ]  denotes negative finding Cardiac  Comments:  Chest pain or chest pressure:    Shortness of breath upon exertion:    Short of breath when lying flat:    Irregular heart rhythm:        Vascular    Pain in calf, thigh, or hip brought on by ambulation:    Pain in feet at night that wakes you up from your sleep:     Blood clot in your veins:    Leg swelling:         Pulmonary    Oxygen at home:    Productive cough:     Wheezing:         Neurologic    Sudden weakness in arms or legs:     Sudden numbness in arms or legs:     Sudden onset of difficulty speaking or slurred speech:    Temporary loss of vision in one eye:     Problems with dizziness:         Gastrointestinal    Blood in stool:     Vomited blood:         Genitourinary    Burning when urinating:     Blood in urine:        Psychiatric    Major depression:         Hematologic    Bleeding problems:    Problems with blood clotting too easily:        Skin    Rashes or ulcers:        Constitutional    Fever or chills:  PHYSICAL EXAMINATION:  Vitals:   07/30/23 0841  BP: (!) 161/77  Pulse: 94  Resp: 20  Temp: 98.2 F (36.8 C)  TempSrc: Temporal  SpO2: 90%  Weight: 154 lb 6.4 oz (70 kg)  Height: 5\' 2"  (1.575 m)    General:  WDWN in NAD; vital signs documented above Gait: Not observed HENT: WNL, normocephalic Pulmonary: normal non-labored breathing , without Rales, rhonchi,  wheezing Cardiac: regular HR Abdomen: soft, NT, no masses Skin: without rashes Vascular Exam/Pulses: Right lower extremity edema however patient has a multiphasic PT signal in the right ankle; also with brisk right DP signal; left PT signal by Doppler Extremities: Fluid surrounding graft throughout the flank and right groin without drainage or erythema; left ankle wound pictured below Musculoskeletal:  no muscle wasting or atrophy  Neurologic: A&O X 3 Psychiatric:  The pt has Normal affect.     ASSESSMENT/PLAN:: 63 y.o. female here for follow up for reevaluation of left ankle wound  Right foot well-perfused with brisk Doppler flow in the PT and DP position.  She continues to have fluid surrounding her axillofemoral bypass graft however no sign of infection.  She is aware of the infection risk with aspiration or evacuation of the fluid.  She has now had a left medial ankle wound for the past 3 months with no significant progress made towards healing.  She has also been through several rounds of antibiotics.  Plan is to proceed with left lower extremity angiography via left common femoral artery with focus on the left iliac artery system.  This will be scheduled with Dr. Myra Gianotti in the next few weeks.  She needs to coordinate care for her husband around the timing of this procedure.  She will continue her aspirin and Plavix perioperatively.  Her phone number was recorded and our schedulers will contact her with a surgery date in the near future.   [name], PA-C [facility] [phone]  Clinic MD:   Myra Gianotti

## 2023-07-31 ENCOUNTER — Other Ambulatory Visit: Payer: Self-pay | Admitting: *Deleted

## 2023-07-31 DIAGNOSIS — I70213 Atherosclerosis of native arteries of extremities with intermittent claudication, bilateral legs: Secondary | ICD-10-CM

## 2023-08-07 ENCOUNTER — Telehealth: Payer: Self-pay

## 2023-08-07 ENCOUNTER — Ambulatory Visit (HOSPITAL_COMMUNITY)
Admission: RE | Admit: 2023-08-07 | Discharge: 2023-08-07 | Disposition: A | Discharge status: Home / Self Care | Payer: Medicaid Other | Attending: Surgery | Admitting: Surgery

## 2023-08-07 ENCOUNTER — Other Ambulatory Visit: Payer: Self-pay

## 2023-08-07 ENCOUNTER — Encounter (HOSPITAL_COMMUNITY)
Admission: RE | Disposition: A | Discharge status: Home / Self Care | Payer: Self-pay | Source: Home / Self Care | Attending: Surgery

## 2023-08-07 DIAGNOSIS — Z7902 Long term (current) use of antithrombotics/antiplatelets: Secondary | ICD-10-CM | POA: Insufficient documentation

## 2023-08-07 DIAGNOSIS — Z7982 Long term (current) use of aspirin: Secondary | ICD-10-CM | POA: Insufficient documentation

## 2023-08-07 DIAGNOSIS — I70243 Atherosclerosis of native arteries of left leg with ulceration of ankle: Secondary | ICD-10-CM | POA: Diagnosis present

## 2023-08-07 DIAGNOSIS — Z794 Long term (current) use of insulin: Secondary | ICD-10-CM | POA: Insufficient documentation

## 2023-08-07 DIAGNOSIS — E11621 Type 2 diabetes mellitus with foot ulcer: Secondary | ICD-10-CM | POA: Diagnosis not present

## 2023-08-07 DIAGNOSIS — F17290 Nicotine dependence, other tobacco product, uncomplicated: Secondary | ICD-10-CM | POA: Insufficient documentation

## 2023-08-07 DIAGNOSIS — L97329 Non-pressure chronic ulcer of left ankle with unspecified severity: Secondary | ICD-10-CM | POA: Diagnosis not present

## 2023-08-07 DIAGNOSIS — Z833 Family history of diabetes mellitus: Secondary | ICD-10-CM | POA: Insufficient documentation

## 2023-08-07 DIAGNOSIS — I70213 Atherosclerosis of native arteries of extremities with intermittent claudication, bilateral legs: Secondary | ICD-10-CM

## 2023-08-07 DIAGNOSIS — E1151 Type 2 diabetes mellitus with diabetic peripheral angiopathy without gangrene: Secondary | ICD-10-CM | POA: Insufficient documentation

## 2023-08-07 DIAGNOSIS — Z95828 Presence of other vascular implants and grafts: Secondary | ICD-10-CM | POA: Diagnosis not present

## 2023-08-07 HISTORY — PX: ABDOMINAL AORTOGRAM W/LOWER EXTREMITY: CATH118223

## 2023-08-07 HISTORY — PX: PERIPHERAL VASCULAR INTERVENTION: CATH118257

## 2023-08-07 LAB — POCT I-STAT, CHEM 8
BUN: 11 mg/dL (ref 8–23)
Calcium, Ion: 1.23 mmol/L (ref 1.15–1.40)
Chloride: 100 mmol/L (ref 98–111)
Creatinine, Ser: 0.4 mg/dL — ABNORMAL LOW (ref 0.44–1.00)
Glucose, Bld: 262 mg/dL — ABNORMAL HIGH (ref 70–99)
HCT: 44 % (ref 36.0–46.0)
Hemoglobin: 15 g/dL (ref 12.0–15.0)
Potassium: 3.8 mmol/L (ref 3.5–5.1)
Sodium: 134 mmol/L — ABNORMAL LOW (ref 135–145)
TCO2: 26 mmol/L (ref 22–32)

## 2023-08-07 LAB — POCT ACTIVATED CLOTTING TIME
Activated Clotting Time: 187 s
Activated Clotting Time: 158 s

## 2023-08-07 LAB — GLUCOSE, CAPILLARY: Glucose-Capillary: 261 mg/dL — ABNORMAL HIGH (ref 70–99)

## 2023-08-07 SURGERY — ABDOMINAL AORTOGRAM W/LOWER EXTREMITY
Anesthesia: LOCAL

## 2023-08-07 MED ORDER — ONDANSETRON HCL 4 MG/2ML IJ SOLN: 4.0000 mg | Freq: Four times a day (QID) | INTRAMUSCULAR | Status: DC | PRN

## 2023-08-07 MED ORDER — SODIUM CHLORIDE 0.9 % IV SOLN: INTRAVENOUS | Status: DC

## 2023-08-07 MED ORDER — LIDOCAINE HCL (PF) 1 % IJ SOLN
INTRAMUSCULAR | Status: AC
Start: 2023-08-07 — End: ?
  Filled 2023-08-07: qty 30

## 2023-08-07 MED ORDER — CLOPIDOGREL BISULFATE 75 MG PO TABS
ORAL_TABLET | ORAL | Status: AC
  Filled 2023-08-07: qty 1

## 2023-08-07 MED ORDER — FENTANYL CITRATE (PF) 100 MCG/2ML IJ SOLN
INTRAMUSCULAR | Status: DC | PRN
  Administered 2023-08-07: 25 ug via INTRAVENOUS
  Administered 2023-08-07: 50 ug via INTRAVENOUS

## 2023-08-07 MED ORDER — LIDOCAINE HCL (PF) 1 % IJ SOLN
INTRAMUSCULAR | Status: DC | PRN
  Administered 2023-08-07: 18 mL

## 2023-08-07 MED ORDER — LABETALOL HCL 5 MG/ML IV SOLN
INTRAVENOUS | Status: AC
  Administered 2023-08-07: 10 mg via INTRAVENOUS
  Filled 2023-08-07: qty 4

## 2023-08-07 MED ORDER — SODIUM CHLORIDE 0.9 % WEIGHT BASED INFUSION: 1.0000 mL/kg/h | INTRAVENOUS | Status: DC

## 2023-08-07 MED ORDER — MIDAZOLAM HCL 2 MG/2ML IJ SOLN
INTRAMUSCULAR | Status: AC
Start: 2023-08-07 — End: ?
  Filled 2023-08-07: qty 2

## 2023-08-07 MED ORDER — CLOPIDOGREL BISULFATE 75 MG PO TABS
ORAL_TABLET | ORAL | Status: DC | PRN
  Administered 2023-08-07: 75 mg via ORAL

## 2023-08-07 MED ORDER — CLOPIDOGREL BISULFATE 75 MG PO TABS
75.0000 mg | ORAL_TABLET | Freq: Every day | ORAL | Status: DC
Start: 2023-08-08 — End: 2023-08-07

## 2023-08-07 MED ORDER — MORPHINE SULFATE (PF) 2 MG/ML IV SOLN
2.0000 mg | INTRAVENOUS | Status: DC | PRN
  Administered 2023-08-07: 2 mg via INTRAVENOUS
  Filled 2023-08-07: qty 1

## 2023-08-07 MED ORDER — ASPIRIN 81 MG PO TBEC
81.0000 mg | DELAYED_RELEASE_TABLET | Freq: Every day | ORAL | Status: DC
  Administered 2023-08-07: 81 mg via ORAL
  Filled 2023-08-07: qty 1

## 2023-08-07 MED ORDER — HEPARIN SODIUM (PORCINE) 1000 UNIT/ML IJ SOLN
INTRAMUSCULAR | Status: DC | PRN
  Administered 2023-08-07: 7000 [IU] via INTRAVENOUS

## 2023-08-07 MED ORDER — MORPHINE SULFATE (PF) 2 MG/ML IV SOLN
INTRAVENOUS | Status: AC
  Administered 2023-08-07: 2 mg via INTRAVENOUS
  Filled 2023-08-07: qty 1

## 2023-08-07 MED ORDER — IODIXANOL 320 MG/ML IV SOLN
INTRAVENOUS | Status: DC | PRN
  Administered 2023-08-07: 110 mL

## 2023-08-07 MED ORDER — MIDAZOLAM HCL 2 MG/2ML IJ SOLN
INTRAMUSCULAR | Status: AC
  Filled 2023-08-07: qty 2

## 2023-08-07 MED ORDER — HYDRALAZINE HCL 20 MG/ML IJ SOLN: 5.0000 mg | INTRAMUSCULAR | Status: DC | PRN

## 2023-08-07 MED ORDER — SODIUM CHLORIDE 0.9% FLUSH: 3.0000 mL | Freq: Two times a day (BID) | INTRAVENOUS | Status: DC

## 2023-08-07 MED ORDER — SODIUM CHLORIDE 0.9% FLUSH: 3.0000 mL | INTRAVENOUS | Status: DC | PRN

## 2023-08-07 MED ORDER — HEPARIN (PORCINE) IN NACL 1000-0.9 UT/500ML-% IV SOLN
INTRAVENOUS | Status: DC | PRN
  Administered 2023-08-07 (×2): 500 mL

## 2023-08-07 MED ORDER — FENTANYL CITRATE (PF) 100 MCG/2ML IJ SOLN
INTRAMUSCULAR | Status: AC
  Filled 2023-08-07: qty 2

## 2023-08-07 MED ORDER — MIDAZOLAM HCL 2 MG/2ML IJ SOLN
INTRAMUSCULAR | Status: DC | PRN
  Administered 2023-08-07: 1 mg via INTRAVENOUS
  Administered 2023-08-07: 2 mg via INTRAVENOUS

## 2023-08-07 MED ORDER — ACETAMINOPHEN 325 MG PO TABS: 650.0000 mg | ORAL_TABLET | ORAL | Status: DC | PRN

## 2023-08-07 MED ORDER — OXYCODONE HCL 5 MG PO TABS: 5.0000 mg | ORAL_TABLET | ORAL | Status: DC | PRN

## 2023-08-07 MED ORDER — LABETALOL HCL 5 MG/ML IV SOLN
10.0000 mg | INTRAVENOUS | Status: DC | PRN
  Administered 2023-08-07: 10 mg via INTRAVENOUS

## 2023-08-07 MED ORDER — SODIUM CHLORIDE 0.9 % IV SOLN: 250.0000 mL | INTRAVENOUS | Status: DC | PRN

## 2023-08-07 SURGICAL SUPPLY — 19 items
BALLN MUSTANG 6X60X75 (BALLOONS) ×2
BALLOON MUSTANG 6X60X75 (BALLOONS) IMPLANT
CATH ANGIO 5F BER2 65CM (CATHETERS) IMPLANT
CATH OMNI FLUSH 5F 65CM (CATHETERS) IMPLANT
DEVICE TORQUE SEADRAGON GRN (MISCELLANEOUS) IMPLANT
GUIDEWIRE ANGLED .035X150CM (WIRE) IMPLANT
KIT ENCORE 26 ADVANTAGE (KITS) IMPLANT
KIT MICROPUNCTURE NIT STIFF (SHEATH) IMPLANT
KIT PV (KITS) ×2 IMPLANT
KIT SINGLE USE MANIFOLD (KITS) IMPLANT
SET ATX-X65L (MISCELLANEOUS) IMPLANT
SHEATH BRITE TIP 7FR 35CM (SHEATH) IMPLANT
SHEATH PINNACLE 5F 10CM (SHEATH) IMPLANT
SHEATH PINNACLE 7F 10CM (SHEATH) IMPLANT
STENT ELUVIA 7X60X130 (Permanent Stent) IMPLANT
STENT VIABAHN 39XCATH 80 (Permanent Stent) IMPLANT
TRAY PV CATH (CUSTOM PROCEDURE TRAY) ×2 IMPLANT
WIRE BENTSON .035X145CM (WIRE) IMPLANT
WIRE HI TORQ VERSACORE 300 (WIRE) IMPLANT

## 2023-08-07 NOTE — Op Note (Signed)
Patient name: Vanessa [name] MRN: [id number] DOB: [birth year] Sex: female  08/07/2023 Pre-operative Diagnosis: Left leg ulcer Post-operative diagnosis:  Same Surgeon:  [name] Procedure Performed:  1.  Ultrasound-guided access, left femoral artery  2.  Abdominal aortogram  3.  Left leg angiogram  4.  Stent, left common iliac artery  5.  Stent, left extrailiac artery  6.  Conscious sedation, 51 minutes    Indications: This is a 63 year old female with history of right axillary femoral bypass graft for limb salvage.  She now has a left leg wound and known multilevel disease.  She is here for further evaluation and possible intervention  Procedure:  The patient was identified in the holding area and taken to room 8.  The patient was then placed supine on the table and prepped and draped in the usual sterile fashion.  A time out was called.  Conscious sedation was administered with the use of IV fentanyl and Versed under continuous physician and nurse monitoring.  Heart rate, blood pressure, and oxygen saturation were continuously monitored.  Total sedation time was 51 minutes.  Ultrasound was used to evaluate the left common femoral artery.  It was patent .  A digital ultrasound image was acquired.  A micropuncture needle was used to access the left common femoral artery under ultrasound guidance.  An 018 wire was advanced without resistance and a micropuncture sheath was placed.  The 018 wire was removed and a benson wire was placed.  The micropuncture sheath was exchanged for a 5 french sheath.  An omniflush catheter was advanced over the wire to the level of L-1.  An abdominal angiogram was obtained.  Next, the catheter was pulled down to the aortic bifurcation and pelvic imaging was performed.  Left leg runoff through the sheath was obtained. Findings:   Aortogram: No significant renal artery stenosis.  Irregular aortic appearance with luminal narrowing, however no pressure gradient across  what appeared to be a significant stenosis was encountered.  Significant greater than 80% left common iliac stenosis and diffuse greater than 80% left external iliac stenosis  Right Lower Extremity: Not evaluated  Left Lower Extremity: Severe luminal narrowing greater than 50% of the left common femoral artery extending into the profundofemoral artery.  The superficial femoral artery is occluded with reconstitution of the above-knee popliteal artery and two-vessel runoff via the posterior tibial and peroneal artery.  Intervention: After the above images were acquired the decision was made to proceed with intervention.  A 7 French Brite tip sheath was inserted.  I then rechecked a aortic pressure gradient and did not find any significant gradient within the infrarenal aorta.  I elected to primarily stent the common iliac artery.  An 8 x 39 VBX stent was deployed landing at the aortic bifurcation and ending just short of the hypogastric artery.  I then stented the external iliac artery with a 7 x 60 Eluvia stent and postdilated with a 6 mm balloon.  Retrograde imaging was performed and showed a widely patent left common iliac artery and significant improvement with no significant stenosis in the external iliac artery with preserved hypogastric artery flow.  There is slight luminal narrowing at the level of the circumflex iliac branches at the femoral head extending into the common femoral artery.  The patient was brought to the holding area for sheath pull once his coagulation profile corrects  Impression:  #1  Irregular appearance of the infrarenal aorta without any associated pressure gradient  #2  High-grade left common iliac stenosis successfully treated with a VBX 8 x 39 stent  #3  High-grade left external iliac diffuse stenosis treated with a 7 x 60 Eluvia stent  #4  Patient will be scheduled for a left femoral endarterectomy, possible retrograde external iliac stenting and left femoral to most likely  above-knee popliteal bypass graft in the immediate future for limb salvage.   Juleen China, M.D., [facility] [place] Office: [phone] Pager:  [phone]

## 2023-08-07 NOTE — Progress Notes (Signed)
Pt ambulated without difficulty or bleeding.  Discharge instructions given to pt and husband  who verbalize understanding and deny further questions.  Discharged home with  husband and son who will drive and stay with pt x 24 hrs.

## 2023-08-07 NOTE — Progress Notes (Signed)
Report given to Misty Stanley, Charity fundraiser.

## 2023-08-07 NOTE — Progress Notes (Signed)
Site area: L Groin Site Prior to Removal:  Level 0 Pressure Applied For: 35 Min Manual:   Yes Patient Status During Pull:  Stable Post Pull Site:  Level 0 Post Pull Instructions Given:  Yes Post Pull Pulses Present: Yes, L R DP Doppler Dressing Applied:  Gauze & tegaderm Bedrest begins @ 1325 Comments: Pt resting, denies pain in site on palpation, verbalized understanding of post pull instruction, will continue to monitor.

## 2023-08-07 NOTE — Telephone Encounter (Signed)
Attempted to call for surgery scheduling. LVM

## 2023-08-07 NOTE — Interval H&P Note (Signed)
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  The various methods of treatment have been discussed with the patient and family. After consideration of risks, benefits and other options for treatment, the patient has consented to  Procedure(s): ABDOMINAL AORTOGRAM W/LOWER EXTREMITY (N/A) as a surgical intervention.  The patient's history has been reviewed, patient examined, no change in status, stable for surgery.  I have reviewed the patient's chart and labs.  Questions were answered to the patient's satisfaction.     [name]

## 2023-08-07 NOTE — Discharge Instructions (Signed)
Femoral Site Care This sheet gives you information about how to care for yourself after your procedure. Your health care provider may also give you more specific instructions. If you have problems or questions, contact your health care provider. What can I expect after the procedure? After the procedure, it is common to have: Bruising that usually fades within 1-2 weeks. Tenderness at the site. Follow these instructions at home: Wound care May remove bandage after 24 hours. Do not take baths, swim, or use a hot tub for 5 days. You may shower 24-48 hours after the procedure. Gently wash the site with plain soap and water. Pat the area dry with a clean towel. Do not rub the site. This may cause bleeding. Do not apply powder or lotion to the site. Keep the site clean and dry. Check your femoral site every day for signs of infection. Check for: Redness, swelling, or pain. Fluid or blood. Warmth. Pus or a bad smell. Activity For the first 2-3 days after your procedure, or as long as directed: Avoid climbing stairs as much as possible. Do not squat. Do not lift, push or pull anything that is heavier than 10 lb for 5 days. Rest as directed. Avoid sitting for a long time without moving. Get up to take short walks every 1-2 hours. Do not drive for 24 hours. General instructions Take over-the-counter and prescription medicines only as told by your health care provider. Keep all follow-up visits as told by your health care provider. This is important. DRINK PLENTY OF FLUIDS FOR THE NEXT 2-3 DAYS. Contact a health care provider if you have: A fever or chills. You have redness, swelling, or pain around your insertion site. Get help right away if: The catheter insertion area swells very fast. You pass out. You suddenly start to sweat or your skin gets clammy. The catheter insertion area is bleeding, and the bleeding does not stop when you hold steady pressure on the area. The area near or  just beyond the catheter insertion site becomes pale, cool, tingly, or numb. These symptoms may represent a serious problem that is an emergency. Do not wait to see if the symptoms will go away. Get medical help right away. Call your local emergency services (911 in the [place] Do not drive yourself to the hospital. Summary After the procedure, it is common to have bruising that usually fades within 1-2 weeks. Check your femoral site every day for signs of infection. Do not lift, push or pull anything that is heavier than 10 lb for 5 days.  This information is not intended to replace advice given to you by your health care provider. Make sure you discuss any questions you have with your health care provider. Document Revised: 06/25/2017 Document Reviewed: 06/25/2017 Elsevier Patient Education  2020 [employer].

## 2023-08-08 ENCOUNTER — Encounter (HOSPITAL_COMMUNITY): Payer: Self-pay | Admitting: Surgery

## 2023-08-09 ENCOUNTER — Other Ambulatory Visit: Payer: Self-pay

## 2023-08-21 ENCOUNTER — Other Ambulatory Visit: Payer: Self-pay

## 2023-08-21 ENCOUNTER — Encounter (HOSPITAL_COMMUNITY): Payer: Self-pay | Admitting: Surgery

## 2023-08-21 NOTE — Progress Notes (Signed)
 SDW CALL  Patient was given pre-op instructions over the phone. The opportunity was given for the patient to ask questions. No further questions asked. Patient verbalized understanding of instructions given.   PCP - Rocky Morel Cardiologist - denies  PPM/ICD - denies   Chest x-ray - 11/27/22 - 1 view EKG - 11/22/22 Stress Test - denies ECHO - 11/26/22 Cardiac Cath - denies  Sleep Study - denies   Fasting Blood Sugar - patient is unsure of fasting glucose because she does not check her blood sugar on a daily basis and if she checks it, it is later in the afternoon   Last dose of GLP1 agonist-  n/a GLP1 instructions: n/a  Blood Thinner Instructions: Patient states last dose of Plavix was 2/21 as instructed from office Aspirin Instructions: patient to continue Aspirin 81 mg  ERAS Protcol - NPO   COVID TEST- n/a   Anesthesia review: yes  Patient denies shortness of breath, fever, cough and chest pain over the phone call   All instructions explained to the patient, with a verbal understanding of the material. Patient agrees to go over the instructions while at home for a better understanding.

## 2023-08-22 ENCOUNTER — Encounter (HOSPITAL_COMMUNITY)
Admission: RE | Disposition: A | Discharge status: Home Health | Payer: Self-pay | Source: Home / Self Care | Attending: Surgery

## 2023-08-22 ENCOUNTER — Inpatient Hospital Stay (HOSPITAL_COMMUNITY)
Admission: RE | Admit: 2023-08-22 | Discharge: 2023-08-28 | DRG: 253 | Disposition: A | Discharge status: Home Health | Payer: Medicaid Other | Attending: Surgery | Admitting: Surgery

## 2023-08-22 ENCOUNTER — Inpatient Hospital Stay (HOSPITAL_COMMUNITY): Payer: Medicaid Other

## 2023-08-22 ENCOUNTER — Other Ambulatory Visit: Payer: Self-pay

## 2023-08-22 ENCOUNTER — Encounter (HOSPITAL_COMMUNITY): Payer: Self-pay | Admitting: Surgery

## 2023-08-22 ENCOUNTER — Inpatient Hospital Stay (HOSPITAL_COMMUNITY): Payer: Self-pay | Admitting: Physician Assistant

## 2023-08-22 DIAGNOSIS — Z7902 Long term (current) use of antithrombotics/antiplatelets: Secondary | ICD-10-CM

## 2023-08-22 DIAGNOSIS — Z8619 Personal history of other infectious and parasitic diseases: Secondary | ICD-10-CM

## 2023-08-22 DIAGNOSIS — F419 Anxiety disorder, unspecified: Secondary | ICD-10-CM | POA: Diagnosis present

## 2023-08-22 DIAGNOSIS — I5022 Chronic systolic (congestive) heart failure: Secondary | ICD-10-CM | POA: Diagnosis present

## 2023-08-22 DIAGNOSIS — J449 Chronic obstructive pulmonary disease, unspecified: Secondary | ICD-10-CM | POA: Diagnosis not present

## 2023-08-22 DIAGNOSIS — Z79899 Other long term (current) drug therapy: Secondary | ICD-10-CM | POA: Diagnosis not present

## 2023-08-22 DIAGNOSIS — L97529 Non-pressure chronic ulcer of other part of left foot with unspecified severity: Secondary | ICD-10-CM | POA: Diagnosis present

## 2023-08-22 DIAGNOSIS — I70213 Atherosclerosis of native arteries of extremities with intermittent claudication, bilateral legs: Secondary | ICD-10-CM

## 2023-08-22 DIAGNOSIS — Z87891 Personal history of nicotine dependence: Secondary | ICD-10-CM

## 2023-08-22 DIAGNOSIS — Z8543 Personal history of malignant neoplasm of ovary: Secondary | ICD-10-CM

## 2023-08-22 DIAGNOSIS — Z888 Allergy status to other drugs, medicaments and biological substances status: Secondary | ICD-10-CM | POA: Diagnosis not present

## 2023-08-22 DIAGNOSIS — F32A Depression, unspecified: Secondary | ICD-10-CM | POA: Diagnosis present

## 2023-08-22 DIAGNOSIS — I70229 Atherosclerosis of native arteries of extremities with rest pain, unspecified extremity: Principal | ICD-10-CM | POA: Diagnosis present

## 2023-08-22 DIAGNOSIS — J4489 Other specified chronic obstructive pulmonary disease: Secondary | ICD-10-CM | POA: Diagnosis present

## 2023-08-22 DIAGNOSIS — Z7982 Long term (current) use of aspirin: Secondary | ICD-10-CM | POA: Diagnosis not present

## 2023-08-22 DIAGNOSIS — I70243 Atherosclerosis of native arteries of left leg with ulceration of ankle: Secondary | ICD-10-CM | POA: Diagnosis present

## 2023-08-22 DIAGNOSIS — Z794 Long term (current) use of insulin: Secondary | ICD-10-CM

## 2023-08-22 DIAGNOSIS — Z8701 Personal history of pneumonia (recurrent): Secondary | ICD-10-CM | POA: Diagnosis not present

## 2023-08-22 DIAGNOSIS — Z87442 Personal history of urinary calculi: Secondary | ICD-10-CM | POA: Diagnosis not present

## 2023-08-22 DIAGNOSIS — Z7951 Long term (current) use of inhaled steroids: Secondary | ICD-10-CM

## 2023-08-22 DIAGNOSIS — I110 Hypertensive heart disease with heart failure: Secondary | ICD-10-CM | POA: Diagnosis present

## 2023-08-22 DIAGNOSIS — E781 Pure hyperglyceridemia: Secondary | ICD-10-CM | POA: Diagnosis present

## 2023-08-22 DIAGNOSIS — Z95820 Peripheral vascular angioplasty status with implants and grafts: Secondary | ICD-10-CM

## 2023-08-22 DIAGNOSIS — K219 Gastro-esophageal reflux disease without esophagitis: Secondary | ICD-10-CM | POA: Diagnosis present

## 2023-08-22 DIAGNOSIS — I10 Essential (primary) hypertension: Secondary | ICD-10-CM

## 2023-08-22 DIAGNOSIS — E1151 Type 2 diabetes mellitus with diabetic peripheral angiopathy without gangrene: Secondary | ICD-10-CM | POA: Diagnosis present

## 2023-08-22 DIAGNOSIS — L97321 Non-pressure chronic ulcer of left ankle limited to breakdown of skin: Secondary | ICD-10-CM | POA: Diagnosis present

## 2023-08-22 DIAGNOSIS — Z8679 Personal history of other diseases of the circulatory system: Secondary | ICD-10-CM

## 2023-08-22 DIAGNOSIS — Z91040 Latex allergy status: Secondary | ICD-10-CM | POA: Diagnosis not present

## 2023-08-22 DIAGNOSIS — I70222 Atherosclerosis of native arteries of extremities with rest pain, left leg: Secondary | ICD-10-CM | POA: Diagnosis present

## 2023-08-22 HISTORY — PX: INSERTION OF ILIAC STENT: SHX6256

## 2023-08-22 HISTORY — DX: Personal history of other diseases of the circulatory system: Z86.79

## 2023-08-22 HISTORY — PX: FEMORAL-POPLITEAL BYPASS GRAFT: SHX937

## 2023-08-22 HISTORY — DX: Dyspnea, unspecified: R06.00

## 2023-08-22 HISTORY — PX: ENDARTERECTOMY FEMORAL: SHX5804

## 2023-08-22 LAB — GLUCOSE, CAPILLARY
Glucose-Capillary: 349 mg/dL — ABNORMAL HIGH (ref 70–99)
Glucose-Capillary: 214 mg/dL — ABNORMAL HIGH (ref 70–99)
Glucose-Capillary: 255 mg/dL — ABNORMAL HIGH (ref 70–99)
Glucose-Capillary: 176 mg/dL — ABNORMAL HIGH (ref 70–99)

## 2023-08-22 LAB — COMPREHENSIVE METABOLIC PANEL
ALT: 12 U/L (ref 0–44)
AST: 25 U/L (ref 15–41)
Albumin: 3.4 g/dL — ABNORMAL LOW (ref 3.5–5.0)
Alkaline Phosphatase: 92 U/L (ref 38–126)
Anion gap: 11 (ref 5–15)
BUN: 14 mg/dL (ref 8–23)
CO2: 25 mmol/L (ref 22–32)
Calcium: 9.2 mg/dL (ref 8.9–10.3)
Chloride: 96 mmol/L — ABNORMAL LOW (ref 98–111)
Creatinine, Ser: 0.46 mg/dL (ref 0.44–1.00)
GFR, Estimated: >60 mL/min (ref >60)
Glucose, Bld: 329 mg/dL — ABNORMAL HIGH (ref 70–99)
Potassium: 4.4 mmol/L (ref 3.5–5.1)
Sodium: 132 mmol/L — ABNORMAL LOW (ref 135–145)
Total Bilirubin: 1.1 mg/dL (ref 0.0–1.2)
Total Protein: 6.7 g/dL (ref 6.5–8.1)

## 2023-08-22 LAB — CBC
HCT: 43.5 % (ref 36.0–46.0)
Hemoglobin: 14.6 g/dL (ref 12.0–15.0)
MCH: 29 pg (ref 26.0–34.0)
MCHC: 33.6 g/dL (ref 30.0–36.0)
MCV: 86.3 fL (ref 80.0–100.0)
Platelets: 410 10*3/uL — ABNORMAL HIGH (ref 150–400)
RBC: 5.04 MIL/uL (ref 3.87–5.11)
RDW: 13 % (ref 11.5–15.5)
WBC: 10.4 10*3/uL (ref 4.0–10.5)
nRBC: 0 % (ref 0.0–0.2)

## 2023-08-22 LAB — PROTIME-INR
INR: 0.9 (ref 0.8–1.2)
Prothrombin Time: 12.6 s (ref 11.4–15.2)

## 2023-08-22 LAB — TYPE AND SCREEN
ABO/RH(D): O POS
Antibody Screen: NEGATIVE

## 2023-08-22 LAB — APTT: aPTT: 27 s (ref 24–36)

## 2023-08-22 SURGERY — BYPASS GRAFT FEMORAL-POPLITEAL ARTERY
Anesthesia: General | Site: Groin | Laterality: Left

## 2023-08-22 MED ORDER — LIDOCAINE 2% (20 MG/ML) 5 ML SYRINGE
INTRAMUSCULAR | Status: AC
  Filled 2023-08-22: qty 5

## 2023-08-22 MED ORDER — POLYETHYLENE GLYCOL 3350 17 G PO PACK: 17.0000 g | PACK | Freq: Every day | ORAL | Status: DC | PRN

## 2023-08-22 MED ORDER — MORPHINE SULFATE (PF) 2 MG/ML IV SOLN
2.0000 mg | INTRAVENOUS | Status: DC | PRN
  Administered 2023-08-23 (×2): 2 mg via INTRAVENOUS
  Filled 2023-08-22 (×2): qty 1

## 2023-08-22 MED ORDER — ACETAMINOPHEN 325 MG PO TABS: 325.0000 mg | ORAL_TABLET | Freq: Once | ORAL | Status: DC | PRN

## 2023-08-22 MED ORDER — PANTOPRAZOLE SODIUM 40 MG PO TBEC
40.0000 mg | DELAYED_RELEASE_TABLET | Freq: Every day | ORAL | Status: DC
  Administered 2023-08-22 – 2023-08-28 (×7): 40 mg via ORAL
  Filled 2023-08-22 (×7): qty 1

## 2023-08-22 MED ORDER — CHLORHEXIDINE GLUCONATE CLOTH 2 % EX PADS: 6.0000 | MEDICATED_PAD | Freq: Once | CUTANEOUS | Status: DC

## 2023-08-22 MED ORDER — ROCURONIUM BROMIDE 10 MG/ML (PF) SYRINGE
PREFILLED_SYRINGE | INTRAVENOUS | Status: DC | PRN
  Administered 2023-08-22: 40 mg via INTRAVENOUS
  Administered 2023-08-22: 60 mg via INTRAVENOUS
  Administered 2023-08-22: 20 mg via INTRAVENOUS

## 2023-08-22 MED ORDER — INSULIN ASPART 100 UNIT/ML IJ SOLN
0.0000 [IU] | Freq: Three times a day (TID) | INTRAMUSCULAR | Status: DC
  Administered 2023-08-22: 8 [IU] via SUBCUTANEOUS
  Administered 2023-08-23: 5 [IU] via SUBCUTANEOUS
  Administered 2023-08-23: 8 [IU] via SUBCUTANEOUS
  Administered 2023-08-23: 3 [IU] via SUBCUTANEOUS
  Administered 2023-08-24: 2 [IU] via SUBCUTANEOUS
  Administered 2023-08-24: 3 [IU] via SUBCUTANEOUS
  Administered 2023-08-24: 5 [IU] via SUBCUTANEOUS
  Administered 2023-08-25 – 2023-08-27 (×6): 3 [IU] via SUBCUTANEOUS
  Administered 2023-08-27: 5 [IU] via SUBCUTANEOUS
  Administered 2023-08-27: 3 [IU] via SUBCUTANEOUS
  Administered 2023-08-28: 5 [IU] via SUBCUTANEOUS

## 2023-08-22 MED ORDER — GUAIFENESIN-DM 100-10 MG/5ML PO SYRP: 15.0000 mL | ORAL_SOLUTION | ORAL | Status: DC | PRN

## 2023-08-22 MED ORDER — MIDAZOLAM HCL 2 MG/2ML IJ SOLN
INTRAMUSCULAR | Status: AC
  Filled 2023-08-22: qty 2

## 2023-08-22 MED ORDER — LACTATED RINGERS IV SOLN: INTRAVENOUS | Status: DC

## 2023-08-22 MED ORDER — CEFAZOLIN SODIUM-DEXTROSE 2-4 GM/100ML-% IV SOLN
2.0000 g | Freq: Three times a day (TID) | INTRAVENOUS | Status: AC
  Administered 2023-08-22 – 2023-08-23 (×2): 2 g via INTRAVENOUS
  Filled 2023-08-22 (×2): qty 100

## 2023-08-22 MED ORDER — LORATADINE 10 MG PO TABS
10.0000 mg | ORAL_TABLET | Freq: Every day | ORAL | Status: DC
  Administered 2023-08-22 – 2023-08-28 (×7): 10 mg via ORAL
  Filled 2023-08-22 (×7): qty 1

## 2023-08-22 MED ORDER — ATORVASTATIN CALCIUM 40 MG PO TABS
40.0000 mg | ORAL_TABLET | Freq: Every day | ORAL | Status: DC
  Administered 2023-08-22 – 2023-08-28 (×7): 40 mg via ORAL
  Filled 2023-08-22 (×7): qty 1

## 2023-08-22 MED ORDER — CEFAZOLIN SODIUM-DEXTROSE 2-4 GM/100ML-% IV SOLN
2.0000 g | INTRAVENOUS | Status: AC
  Administered 2023-08-22: 2 g via INTRAVENOUS
  Filled 2023-08-22: qty 100

## 2023-08-22 MED ORDER — PROTAMINE SULFATE 10 MG/ML IV SOLN
INTRAVENOUS | Status: AC
  Filled 2023-08-22: qty 5

## 2023-08-22 MED ORDER — HYDRALAZINE HCL 20 MG/ML IJ SOLN: 5.0000 mg | INTRAMUSCULAR | Status: DC | PRN

## 2023-08-22 MED ORDER — SURGIFLO WITH THROMBIN (HEMOSTATIC MATRIX KIT) OPTIME
TOPICAL | Status: DC | PRN
  Administered 2023-08-22: 1 via TOPICAL

## 2023-08-22 MED ORDER — MEPERIDINE HCL 25 MG/ML IJ SOLN
6.2500 mg | INTRAMUSCULAR | Status: DC | PRN
Start: 2023-08-22 — End: 2023-08-22

## 2023-08-22 MED ORDER — CHLORHEXIDINE GLUCONATE CLOTH 2 % EX PADS
6.0000 | MEDICATED_PAD | Freq: Once | CUTANEOUS | Status: DC
Start: 2023-08-22 — End: 2023-08-22

## 2023-08-22 MED ORDER — OXYCODONE HCL 5 MG PO TABS
5.0000 mg | ORAL_TABLET | ORAL | Status: DC | PRN
  Administered 2023-08-22 – 2023-08-23 (×3): 5 mg via ORAL
  Administered 2023-08-23 – 2023-08-25 (×9): 10 mg via ORAL
  Administered 2023-08-26: 5 mg via ORAL
  Administered 2023-08-26 – 2023-08-28 (×7): 10 mg via ORAL
  Filled 2023-08-22: qty 1
  Filled 2023-08-22 (×7): qty 2
  Filled 2023-08-22 (×2): qty 1
  Filled 2023-08-22 (×3): qty 2
  Filled 2023-08-22: qty 1
  Filled 2023-08-22 (×6): qty 2

## 2023-08-22 MED ORDER — HEPARIN 6000 UNIT IRRIGATION SOLUTION
Status: DC | PRN
  Administered 2023-08-22: 1

## 2023-08-22 MED ORDER — PROPOFOL 10 MG/ML IV BOLUS
INTRAVENOUS | Status: AC
  Filled 2023-08-22: qty 20

## 2023-08-22 MED ORDER — PHENYLEPHRINE HCL-NACL 20-0.9 MG/250ML-% IV SOLN
INTRAVENOUS | Status: DC | PRN
Start: 2023-08-22 — End: 2023-08-22
  Administered 2023-08-22: 40 ug/min via INTRAVENOUS

## 2023-08-22 MED ORDER — HEPARIN 6000 UNIT IRRIGATION SOLUTION
Status: AC
  Filled 2023-08-22: qty 500

## 2023-08-22 MED ORDER — HYDROMORPHONE HCL 1 MG/ML IJ SOLN
0.2500 mg | INTRAMUSCULAR | Status: DC | PRN
Start: 2023-08-22 — End: 2023-08-22

## 2023-08-22 MED ORDER — IODIXANOL 320 MG/ML IV SOLN
INTRAVENOUS | Status: DC | PRN
  Administered 2023-08-22: 15 mL via INTRA_ARTERIAL

## 2023-08-22 MED ORDER — INSULIN GLARGINE-LIXISENATIDE 100-33 UNT-MCG/ML ~~LOC~~ SOPN: 10.0000 [IU] | PEN_INJECTOR | Freq: Every day | SUBCUTANEOUS | Status: DC

## 2023-08-22 MED ORDER — PROPOFOL 10 MG/ML IV BOLUS
INTRAVENOUS | Status: DC | PRN
  Administered 2023-08-22: 40 mg via INTRAVENOUS
  Administered 2023-08-22: 120 mg via INTRAVENOUS

## 2023-08-22 MED ORDER — ACETAMINOPHEN 650 MG RE SUPP: 325.0000 mg | RECTAL | Status: DC | PRN

## 2023-08-22 MED ORDER — DOCUSATE SODIUM 100 MG PO CAPS
100.0000 mg | ORAL_CAPSULE | Freq: Every day | ORAL | Status: DC
  Administered 2023-08-23 – 2023-08-28 (×5): 100 mg via ORAL
  Filled 2023-08-22 (×6): qty 1

## 2023-08-22 MED ORDER — CHLORHEXIDINE GLUCONATE 0.12 % MT SOLN
OROMUCOSAL | Status: AC
  Administered 2023-08-22: 15 mL via OROMUCOSAL
  Filled 2023-08-22: qty 15

## 2023-08-22 MED ORDER — SODIUM CHLORIDE 0.9 % IV SOLN
500.0000 mL | Freq: Once | INTRAVENOUS | Status: AC | PRN
  Administered 2023-08-22: 500 mL via INTRAVENOUS

## 2023-08-22 MED ORDER — ROCURONIUM BROMIDE 10 MG/ML (PF) SYRINGE
PREFILLED_SYRINGE | INTRAVENOUS | Status: AC
  Filled 2023-08-22: qty 10

## 2023-08-22 MED ORDER — PHENYLEPHRINE 80 MCG/ML (10ML) SYRINGE FOR IV PUSH (FOR BLOOD PRESSURE SUPPORT)
PREFILLED_SYRINGE | INTRAVENOUS | Status: AC
  Filled 2023-08-22: qty 10

## 2023-08-22 MED ORDER — FENTANYL CITRATE (PF) 250 MCG/5ML IJ SOLN
INTRAMUSCULAR | Status: AC
Start: 2023-08-22 — End: ?
  Filled 2023-08-22: qty 5

## 2023-08-22 MED ORDER — CLOPIDOGREL BISULFATE 75 MG PO TABS
75.0000 mg | ORAL_TABLET | Freq: Every day | ORAL | Status: DC
  Administered 2023-08-23 – 2023-08-28 (×6): 75 mg via ORAL
  Filled 2023-08-22 (×6): qty 1

## 2023-08-22 MED ORDER — HEPARIN SODIUM (PORCINE) 1000 UNIT/ML IJ SOLN
INTRAMUSCULAR | Status: AC
  Filled 2023-08-22: qty 10

## 2023-08-22 MED ORDER — ONDANSETRON HCL 4 MG/2ML IJ SOLN
INTRAMUSCULAR | Status: DC | PRN
  Administered 2023-08-22: 4 mg via INTRAVENOUS

## 2023-08-22 MED ORDER — ALBUTEROL SULFATE HFA 108 (90 BASE) MCG/ACT IN AERS
1.0000 | INHALATION_SPRAY | Freq: Four times a day (QID) | RESPIRATORY_TRACT | Status: DC | PRN
  Filled 2023-08-22: qty 6.7

## 2023-08-22 MED ORDER — SUGAMMADEX SODIUM 200 MG/2ML IV SOLN
INTRAVENOUS | Status: AC
Start: 2023-08-22 — End: ?
  Filled 2023-08-22: qty 2

## 2023-08-22 MED ORDER — PHENYLEPHRINE 80 MCG/ML (10ML) SYRINGE FOR IV PUSH (FOR BLOOD PRESSURE SUPPORT)
PREFILLED_SYRINGE | INTRAVENOUS | Status: DC | PRN
Start: 2023-08-22 — End: 2023-08-22
  Administered 2023-08-22: 80 ug via INTRAVENOUS
  Administered 2023-08-22 (×3): 160 ug via INTRAVENOUS

## 2023-08-22 MED ORDER — ACETAMINOPHEN 160 MG/5ML PO SOLN: 325.0000 mg | Freq: Once | ORAL | Status: DC | PRN

## 2023-08-22 MED ORDER — GABAPENTIN 300 MG PO CAPS
300.0000 mg | ORAL_CAPSULE | Freq: Every day | ORAL | Status: DC
  Administered 2023-08-22 – 2023-08-27 (×6): 300 mg via ORAL
  Filled 2023-08-22 (×6): qty 1

## 2023-08-22 MED ORDER — ACETAMINOPHEN 10 MG/ML IV SOLN: 1000.0000 mg | Freq: Once | INTRAVENOUS | Status: DC | PRN

## 2023-08-22 MED ORDER — SODIUM CHLORIDE 0.9 % IV SOLN: INTRAVENOUS | Status: AC

## 2023-08-22 MED ORDER — EPHEDRINE SULFATE-NACL 50-0.9 MG/10ML-% IV SOSY
PREFILLED_SYRINGE | INTRAVENOUS | Status: DC | PRN
  Administered 2023-08-22: 5 mg via INTRAVENOUS

## 2023-08-22 MED ORDER — 0.9 % SODIUM CHLORIDE (POUR BTL) OPTIME
TOPICAL | Status: DC | PRN
  Administered 2023-08-22: 2000 mL

## 2023-08-22 MED ORDER — METOPROLOL TARTRATE 5 MG/5ML IV SOLN
INTRAVENOUS | Status: DC | PRN
Start: 2023-08-22 — End: 2023-08-22
  Administered 2023-08-22: 2 mg via INTRAVENOUS
  Administered 2023-08-22: 1 mg via INTRAVENOUS
  Administered 2023-08-22: 2 mg via INTRAVENOUS

## 2023-08-22 MED ORDER — ONDANSETRON HCL 4 MG/2ML IJ SOLN
INTRAMUSCULAR | Status: AC
  Filled 2023-08-22: qty 2

## 2023-08-22 MED ORDER — FLUTICASONE FUROATE-VILANTEROL 200-25 MCG/ACT IN AEPB
1.0000 | INHALATION_SPRAY | Freq: Every day | RESPIRATORY_TRACT | Status: DC
Start: 2023-08-22 — End: 2023-08-28
  Administered 2023-08-23 – 2023-08-28 (×6): 1 via RESPIRATORY_TRACT
  Filled 2023-08-22 (×2): qty 28

## 2023-08-22 MED ORDER — LIDOCAINE 2% (20 MG/ML) 5 ML SYRINGE
INTRAMUSCULAR | Status: DC | PRN
  Administered 2023-08-22: 40 mg via INTRAVENOUS

## 2023-08-22 MED ORDER — ROCURONIUM BROMIDE 10 MG/ML (PF) SYRINGE
PREFILLED_SYRINGE | INTRAVENOUS | Status: AC
Start: 2023-08-22 — End: ?
  Filled 2023-08-22: qty 10

## 2023-08-22 MED ORDER — DROPERIDOL 2.5 MG/ML IJ SOLN: 0.6250 mg | Freq: Once | INTRAMUSCULAR | Status: DC | PRN

## 2023-08-22 MED ORDER — MIDAZOLAM HCL 2 MG/2ML IJ SOLN
INTRAMUSCULAR | Status: DC | PRN
  Administered 2023-08-22: 2 mg via INTRAVENOUS

## 2023-08-22 MED ORDER — CITALOPRAM HYDROBROMIDE 20 MG PO TABS
40.0000 mg | ORAL_TABLET | Freq: Every day | ORAL | Status: DC
  Administered 2023-08-23 – 2023-08-28 (×6): 40 mg via ORAL
  Filled 2023-08-22 (×6): qty 2

## 2023-08-22 MED ORDER — CHLORHEXIDINE GLUCONATE 0.12 % MT SOLN: 15.0000 mL | Freq: Once | OROMUCOSAL | Status: AC

## 2023-08-22 MED ORDER — LISINOPRIL 5 MG PO TABS
5.0000 mg | ORAL_TABLET | Freq: Every day | ORAL | Status: DC
  Administered 2023-08-22 – 2023-08-28 (×4): 5 mg via ORAL
  Filled 2023-08-22 (×7): qty 1

## 2023-08-22 MED ORDER — METOPROLOL TARTRATE 5 MG/5ML IV SOLN
INTRAVENOUS | Status: AC
Start: 2023-08-22 — End: ?
  Filled 2023-08-22: qty 5

## 2023-08-22 MED ORDER — SUGAMMADEX SODIUM 200 MG/2ML IV SOLN
INTRAVENOUS | Status: DC | PRN
  Administered 2023-08-22: 200 mg via INTRAVENOUS

## 2023-08-22 MED ORDER — TIOTROPIUM BROMIDE MONOHYDRATE 18 MCG IN CAPS
18.0000 ug | ORAL_CAPSULE | Freq: Every day | RESPIRATORY_TRACT | Status: DC
Start: 2023-08-22 — End: 2023-08-22

## 2023-08-22 MED ORDER — FENTANYL CITRATE (PF) 250 MCG/5ML IJ SOLN
INTRAMUSCULAR | Status: DC | PRN
  Administered 2023-08-22 (×2): 100 ug via INTRAVENOUS
  Administered 2023-08-22: 50 ug via INTRAVENOUS

## 2023-08-22 MED ORDER — INSULIN GLARGINE 100 UNIT/ML ~~LOC~~ SOLN
10.0000 [IU] | Freq: Every day | SUBCUTANEOUS | Status: DC
  Administered 2023-08-23 – 2023-08-28 (×6): 10 [IU] via SUBCUTANEOUS
  Filled 2023-08-22 (×6): qty 0.1

## 2023-08-22 MED ORDER — SODIUM CHLORIDE 0.9 % IV SOLN
INTRAVENOUS | Status: DC
Start: 2023-08-22 — End: 2023-08-22

## 2023-08-22 MED ORDER — ORAL CARE MOUTH RINSE: 15.0000 mL | Freq: Once | OROMUCOSAL | Status: AC

## 2023-08-22 MED ORDER — EZETIMIBE 10 MG PO TABS
10.0000 mg | ORAL_TABLET | Freq: Every day | ORAL | Status: DC
  Administered 2023-08-22 – 2023-08-28 (×7): 10 mg via ORAL
  Filled 2023-08-22 (×7): qty 1

## 2023-08-22 MED ORDER — ASPIRIN 81 MG PO TBEC
81.0000 mg | DELAYED_RELEASE_TABLET | Freq: Every day | ORAL | Status: DC
  Administered 2023-08-23 – 2023-08-28 (×6): 81 mg via ORAL
  Filled 2023-08-22 (×6): qty 1

## 2023-08-22 MED ORDER — PHENOL 1.4 % MT LIQD: 1.0000 | OROMUCOSAL | Status: DC | PRN

## 2023-08-22 MED ORDER — MAGNESIUM SULFATE 2 GM/50ML IV SOLN: 2.0000 g | Freq: Every day | INTRAVENOUS | Status: DC | PRN

## 2023-08-22 MED ORDER — ACETAMINOPHEN 325 MG PO TABS
325.0000 mg | ORAL_TABLET | ORAL | Status: DC | PRN
  Administered 2023-08-23 – 2023-08-27 (×3): 650 mg via ORAL
  Filled 2023-08-22 (×3): qty 2

## 2023-08-22 MED ORDER — HEPARIN SODIUM (PORCINE) 1000 UNIT/ML IJ SOLN
INTRAMUSCULAR | Status: DC | PRN
  Administered 2023-08-22: 7000 [IU] via INTRAVENOUS
  Administered 2023-08-22: 5000 [IU] via INTRAVENOUS
  Administered 2023-08-22: 1000 [IU] via INTRAVENOUS
  Administered 2023-08-22: 2000 [IU] via INTRAVENOUS

## 2023-08-22 MED ORDER — BUPROPION HCL ER (SR) 150 MG PO TB12: 150.0000 mg | ORAL_TABLET | Freq: Every day | ORAL | Status: DC | PRN

## 2023-08-22 MED ORDER — LABETALOL HCL 5 MG/ML IV SOLN: 10.0000 mg | INTRAVENOUS | Status: DC | PRN

## 2023-08-22 MED ORDER — UMECLIDINIUM BROMIDE 62.5 MCG/ACT IN AEPB
1.0000 | INHALATION_SPRAY | Freq: Every day | RESPIRATORY_TRACT | Status: DC
  Administered 2023-08-23 – 2023-08-28 (×6): 1 via RESPIRATORY_TRACT
  Filled 2023-08-22 (×2): qty 7

## 2023-08-22 MED ORDER — ONDANSETRON HCL 4 MG/2ML IJ SOLN: 4.0000 mg | Freq: Four times a day (QID) | INTRAMUSCULAR | Status: DC | PRN

## 2023-08-22 MED ORDER — HEPARIN SODIUM (PORCINE) 5000 UNIT/ML IJ SOLN
5000.0000 [IU] | Freq: Three times a day (TID) | INTRAMUSCULAR | Status: DC
  Administered 2023-08-23 – 2023-08-28 (×15): 5000 [IU] via SUBCUTANEOUS
  Filled 2023-08-22 (×15): qty 1

## 2023-08-22 MED ORDER — INSULIN ASPART 100 UNIT/ML IJ SOLN
0.0000 [IU] | INTRAMUSCULAR | Status: DC | PRN
  Administered 2023-08-22: 12 [IU] via SUBCUTANEOUS
  Filled 2023-08-22: qty 1

## 2023-08-22 MED ORDER — BISACODYL 10 MG RE SUPP: 10.0000 mg | Freq: Every day | RECTAL | Status: DC | PRN

## 2023-08-22 MED ORDER — METOPROLOL TARTRATE 5 MG/5ML IV SOLN: 2.0000 mg | INTRAVENOUS | Status: DC | PRN

## 2023-08-22 MED ORDER — ALUM & MAG HYDROXIDE-SIMETH 200-200-20 MG/5ML PO SUSP
15.0000 mL | ORAL | Status: DC | PRN
  Administered 2023-08-23: 30 mL via ORAL
  Filled 2023-08-22: qty 30

## 2023-08-22 MED ORDER — POTASSIUM CHLORIDE CRYS ER 20 MEQ PO TBCR: 20.0000 meq | EXTENDED_RELEASE_TABLET | Freq: Every day | ORAL | Status: DC | PRN

## 2023-08-22 MED ORDER — INSULIN ASPART 100 UNIT/ML IJ SOLN: 0.0000 [IU] | Freq: Three times a day (TID) | INTRAMUSCULAR | Status: DC

## 2023-08-22 MED ORDER — PROTAMINE SULFATE 10 MG/ML IV SOLN
INTRAVENOUS | Status: DC | PRN
  Administered 2023-08-22: 50 mg via INTRAVENOUS

## 2023-08-22 SURGICAL SUPPLY — 55 items
BAG COUNTER SPONGE SURGICOUNT (BAG) ×1 IMPLANT
BALLN MUSTANG 6.0X40 135 (BALLOONS) ×1 IMPLANT
BALLOON MUSTANG 6.0X40 135 (BALLOONS) IMPLANT
CANISTER SUCT 3000ML PPV (MISCELLANEOUS) ×1 IMPLANT
CANNULA VESSEL 3MM 2 BLNT TIP (CANNULA) ×1 IMPLANT
CATH EMB LF 4FRX80 (CATHETERS) IMPLANT
CATH OMNI FLUSH 5F 65CM (CATHETERS) ×1 IMPLANT
CLIP TI MEDIUM 24 (CLIP) ×1 IMPLANT
CLIP TI WIDE RED SMALL 24 (CLIP) ×1 IMPLANT
COVER DOME SNAP 22 D (MISCELLANEOUS) IMPLANT
CUFF TOURN SGL QUICK 34 NS (TOURNIQUET CUFF) IMPLANT
DERMABOND ADVANCED .7 DNX12 (GAUZE/BANDAGES/DRESSINGS) ×1 IMPLANT
DEVICE INFLATION ENCORE 26 (MISCELLANEOUS) IMPLANT
DRAPE HALF SHEET 40X57 (DRAPES) IMPLANT
ELECT REM PT RETURN 9FT ADLT (ELECTROSURGICAL) ×1 IMPLANT
ELECTRODE REM PT RTRN 9FT ADLT (ELECTROSURGICAL) ×1 IMPLANT
GLOVE SURG SS PI 7.5 STRL IVOR (GLOVE) ×3 IMPLANT
GOWN STRL REUS W/ TWL LRG LVL3 (GOWN DISPOSABLE) ×2 IMPLANT
GOWN STRL REUS W/ TWL XL LVL3 (GOWN DISPOSABLE) ×1 IMPLANT
GRAFT PROPATEN W/RING 6X80X60 (Vascular Products) IMPLANT
INSERT FOGARTY SM (MISCELLANEOUS) IMPLANT
KIT BASIN OR (CUSTOM PROCEDURE TRAY) ×1 IMPLANT
KIT TURNOVER KIT B (KITS) ×1 IMPLANT
LOOP VESSEL MINI RED (MISCELLANEOUS) IMPLANT
MARKER GRAFT CORONARY BYPASS (MISCELLANEOUS) IMPLANT
NS IRRIG 1000ML POUR BTL (IV SOLUTION) ×2 IMPLANT
PACK ENDO MINOR (CUSTOM PROCEDURE TRAY) ×1 IMPLANT
PACK PERIPHERAL VASCULAR (CUSTOM PROCEDURE TRAY) ×1 IMPLANT
PAD ARMBOARD 7.5X6 YLW CONV (MISCELLANEOUS) ×2 IMPLANT
SET MICROPUNCTURE 5F STIFF (MISCELLANEOUS) ×1 IMPLANT
SHEATH BRITE TIP 7FRX11 (SHEATH) IMPLANT
SHEATH PINNACLE 5F 10CM (SHEATH) ×1 IMPLANT
SHEATH PINNACLE 8F 10CM (SHEATH) ×1 IMPLANT
SPONGE INTESTINAL PEANUT (DISPOSABLE) ×1 IMPLANT
STENT ELUVIA 7X40X130 (Permanent Stent) IMPLANT
STOPCOCK 4 WAY LG BORE MALE ST (IV SETS) IMPLANT
STOPCOCK MORSE 400PSI 3WAY (MISCELLANEOUS) ×1 IMPLANT
SURGIFLO W/THROMBIN 8M KIT (HEMOSTASIS) IMPLANT
SUT PROLENE 5 0 C 1 24 (SUTURE) ×1 IMPLANT
SUT PROLENE 6 0 BV (SUTURE) ×1 IMPLANT
SUT SILK 2 0 PERMA HAND 18 BK (SUTURE) IMPLANT
SUT SILK 2 0 SH (SUTURE) ×1 IMPLANT
SUT SILK 3-0 18XBRD TIE 12 (SUTURE) IMPLANT
SUT VIC AB 2-0 CT1 TAPERPNT 27 (SUTURE) ×2 IMPLANT
SUT VIC AB 3-0 SH 27X BRD (SUTURE) ×2 IMPLANT
SUT VIC AB 3-0 X1 27 (SUTURE) ×1 IMPLANT
SUT VICRYL 4-0 PS2 18IN ABS (SUTURE) ×2 IMPLANT
SYR MEDRAD MARK V 150ML (SYRINGE) ×1 IMPLANT
TOWEL GREEN STERILE (TOWEL DISPOSABLE) ×1 IMPLANT
TRAY FOLEY MTR SLVR 16FR STAT (SET/KITS/TRAYS/PACK) ×1 IMPLANT
TUBING HIGH PRESSURE 120CM (CONNECTOR) ×1 IMPLANT
UNDERPAD 30X36 HEAVY ABSORB (UNDERPADS AND DIAPERS) ×1 IMPLANT
WATER STERILE IRR 1000ML POUR (IV SOLUTION) ×1 IMPLANT
WIRE BENTSON .035X145CM (WIRE) ×1 IMPLANT
WIRE HI TORQ VERSACORE J 260CM (WIRE) IMPLANT

## 2023-08-22 NOTE — Anesthesia Procedure Notes (Signed)
 Arterial Line Insertion Start/End2/26/2025 8:35 AM, 08/22/2023 8:45 AM Performed by: Waynard Edwards, CRNA, CRNA  Patient location: Pre-op. Preanesthetic checklist: patient identified, IV checked, site marked, risks and benefits discussed, surgical consent, monitors and equipment checked, pre-op evaluation, timeout performed and anesthesia consent Lidocaine 1% used for infiltration Left, radial was placed Catheter size: 20 G Hand hygiene performed , maximum sterile barriers used  and Seldinger technique used Allen's test indicative of satisfactory collateral circulation Attempts: 1 Procedure performed without using ultrasound guided technique. Following insertion, dressing applied and Biopatch. Post procedure assessment: normal and unchanged  Patient tolerated the procedure well with no immediate complications.

## 2023-08-22 NOTE — Progress Notes (Signed)
  Day of Surgery Note    Subjective:  awake in recovery   Vitals:   08/22/23 1400 08/22/23 1415  BP: 117/69 112/68  Pulse: (!) 108 (!) 107  Resp: 13 12  Temp:    SpO2: 92% 91%    Incisions:   left groin and left above knee incisions are clean  Extremities:  brisk left PT and AT doppler flow.  Left calf is soft and non tender  Left medial ankle ulcer:  Cardiac:  regular Lungs:  non labored   Assessment/Plan:  This is a 63 y.o. female who is s/p  Left EIA stent and left CFA to AK popliteal artery bypass with external ringed PTFE 08/22/2023 by Dr. Myra Gianotti  -pt with brisk doppler flow left PT and AT.  Left calf is soft and non tender -plavix scheduled to restart tomorrow -insulin reordered to start in am and SSI also ordered -continue asa and statin -to 4 east when bed available   Doreatha Massed, PA-C 08/22/2023 2:23 PM [phone]

## 2023-08-22 NOTE — Op Note (Signed)
 Patient name: Vanessa [name] MRN: [id number] DOB: 03/17/1961 Sex: female  08/22/2023 Pre-operative Diagnosis: Left leg ulcer Post-operative diagnosis:  Same Surgeon:  [name] Assistants:  Doreatha Massed, PA Procedure:   #1: Left femoral to above-knee popliteal artery bypass graft with 6 mm external ring PTFE   #2: Left external iliac stent (7 x 40 Eluvia) Anesthesia:  General Blood Loss:  ,minimal Specimens:  none  Findings: Proximal anastomosis is the distal common femoral artery extending onto the profundofemoral artery for 1-2 mm.  The above-knee popliteal artery was relatively soft and disease-free.  Indications: This is a 63 year old female with history of right axillary femoral bypass graft and left iliac stenting.  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  There is also distal external iliac residual stenosis.  She comes in today for iliac intervention and femoral-popliteal bypass graft.  Procedure:  The patient was identified in the holding area and taken to [facility] OR ROOM 16  The patient was then placed supine on the table. general anesthesia was administered.  The patient was prepped and draped in the usual sterile fashion.  A time out was called and antibiotics were administered.  A PA was necessary to expedite the procedure and assist with technical details.  She helped with exposure by providing suction and retraction.  She helped with the anastomoses by following the suture.  She help with wound closure.  An oblique incision was made in the left groin.  Cautery was used to divide subcutaneous tissue down to the femoral sheath which was opened sharply.  I exposed the common femoral artery from under the inguinal ligament down to the bifurcation.  The profundus and superficial femoral artery were individually isolated.  The anterior surface of the artery was soft but there was posterior plaque.  A medial above-knee incision  was then created with a 10 blade.  Cautery was used to divide the subcutaneous tissue and the fascia.  The popliteal space was entered and the above-knee popliteal artery was exposed.  This was mildly thickened but relatively soft.  A curved Gore tunneler was used to create a subsartorial tunnel between the 2 incisions.  The patient was fully heparinized.  I then cannulated the common femoral artery with a micropuncture needle and advanced a wire into the aorta followed by micropuncture sheath.  Over a 035 wire, a 7 French sheath was inserted.  A retrograde injection was performed that showed her iliac stents to be widely patent however distal to the external iliac stent there was residual stenosis above the femoral head which I stented using a 7 x 40 Eluvia stent and postdilated with a 6 mm balloon.  The sheath was then removed and proximal control was obtained with a balloon.  The profundus and superficial femoral artery were individually occluded.  I then opened the common femoral artery with Potts scissors down onto the origin of the profundofemoral artery.  I was able to remove the balloon and placed a Henley clamp making sure not to disrupt the previously placed stent.  A 6 mm external ring propatent PTFE graft was brought onto the field.  It was cut to fit the size the arteriotomy and a running anastomosis was created with 6-0 Prolene.  Prior to completion the appropriate flushing maneuvers were performed and the anastomosis was completed.  There was excellent pulsatile flow through the graft.  The graft was occluded and then sutured to  the tunneler and brought through the tunnel.  A tourniquet was placed on the upper thigh.  The leg was exsanguinated with an Esmarch and the tourniquet was inflated to 250 mm of pressure.  A #11 blade was used to make an arteriotomy in the above-knee popliteal artery which was extended with Potts scissors.  The graft was cut the appropriate length and spatulated to fit the  size of the arteriotomy.  A running anastomosis was created with 6-0 Prolene.  Prior to completion the tourniquet was let down and the appropriate flushing maneuvers were performed.  The anastomosis was then completed.  The patient had a triphasic popliteal Doppler signal distal to the bypass.  She had graft dependent pedal Doppler signals.  The patient's heparin was reversed with 50 mg of protamine.  The wounds were irrigated and hemostasis was achieved.  The above-knee incision was closed by reapproximating the fascia with 2-0 Vicryl, the subcutaneous tissue with 3-0 Vicryl followed by subcuticular closure.  The groin incision was closed by reapproximating the femoral sheath with 2-0 Vicryl.  The subcutaneous tissue was closed with 2 additional layers of 3-0 Vicryl followed by subcuticular closure.  Dermabond was placed on the incisions.  The patient was successfully extubated and taken recovery room in stable condition.  There were no immediate complications.  Disposition: To PACU stable.   Juleen China, M.D., [facility] [place] Office: [phone] Pager:  [phone]

## 2023-08-22 NOTE — Transfer of Care (Signed)
 Immediate Anesthesia Transfer of Care Note  Patient: Vanessa [name]  Procedure(s) Performed: LEFT BYPASS GRAFT FEMORAL-POPLITEAL ARTERY (Left: Groin) LEFT ENDARTERECTOMY FEMORAL (Left: Groin) INSERTION OF ILIAC STENT - LEFT (Left: Groin)  Patient Location: PACU  Anesthesia Type:General  Level of Consciousness: drowsy  Airway & Oxygen Therapy: Patient Spontanous Breathing and Patient connected to nasal cannula oxygen  Post-op Assessment: Post -op Vital signs reviewed and stable  Post vital signs: Reviewed and stable  Last Vitals:  Vitals Value Taken Time  BP 124/69 08/22/23 1335  Temp    Pulse 104 08/22/23 1338  Resp 13 08/22/23 1338  SpO2 93 % 08/22/23 1338  Vitals shown include unfiled device data.  Last Pain:  Vitals:   08/22/23 0823  TempSrc:   PainSc: 0-No pain         Complications: No notable events documented.

## 2023-08-22 NOTE — H&P (Signed)
   Patient name: Vanessa [name] MRN: [id number] DOB: 06-29-1960 Sex: female   HISTORY OF PRESENT ILLNESS:   Vanessa [name] is a 63 y.o. female with a left foot ulcer. She recently underwent left iliac stenting.  She comes here today for SFA bypass  CURRENT MEDICATIONS:    Current Facility-Administered Medications  Medication Dose Route Frequency Provider Last Rate Last Admin   0.9 %  sodium chloride infusion   Intravenous Continuous Nada Libman, MD 10 mL/hr at 08/22/23 0828 New Bag at 08/22/23 0828   ceFAZolin (ANCEF) IVPB 2g/100 mL premix  2 g Intravenous 30 min Pre-Op Nada Libman, MD       Chlorhexidine Gluconate Cloth 2 % PADS 6 each  6 each Topical Once Nada Libman, MD       And   Chlorhexidine Gluconate Cloth 2 % PADS 6 each  6 each Topical Once Nada Libman, MD       insulin aspart (novoLOG) injection 0-14 Units  0-14 Units Subcutaneous Q2H PRN Shelton Silvas, MD   12 Units at 08/22/23 4098   lactated ringers infusion   Intravenous Continuous Shelton Silvas, MD        REVIEW OF SYSTEMS:   [X]  denotes positive finding, [ ]  denotes negative finding Cardiac  Comments:  Chest pain or chest pressure:    Shortness of breath upon exertion:    Short of breath when lying flat:    Irregular heart rhythm:    Constitutional    Fever or chills:      PHYSICAL EXAM:   Vitals:   08/22/23 0751  BP: (!) 154/76  Pulse: 94  Resp: 18  Temp: 98.5 F (36.9 C)  TempSrc: Oral  SpO2: 95%  Weight: 68 kg  Height: 5\' 2"  (1.575 m)    GENERAL: The patient is a well-nourished female, in no acute distress. The vital signs are documented above. CARDIOVASCULAR: There is a regular rate and rhythm. PULMONARY: Non-labored respirations   STUDIES:      MEDICAL ISSUES:   Discussed left iliac stenting and left fem pop bypass.  All questions answered  Charlena Cross, MD, FACS [facility] [phone] Pager [phone]

## 2023-08-22 NOTE — Anesthesia Procedure Notes (Signed)
 Procedure Name: Intubation Date/Time: 08/22/2023 10:12 AM  Performed by: Georgianne Fick D, CRNAPre-anesthesia Checklist: Patient identified, Emergency Drugs available, Suction available and Patient being monitored Patient Re-evaluated:Patient Re-evaluated prior to induction Oxygen Delivery Method: Circle System Utilized Preoxygenation: Pre-oxygenation with 100% oxygen Induction Type: IV induction Ventilation: Mask ventilation without difficulty Laryngoscope Size: Miller and 2 Grade View: Grade I Tube type: Oral Tube size: 7.0 mm Number of attempts: 1 Airway Equipment and Method: Stylet and Oral airway Placement Confirmation: ETT inserted through vocal cords under direct vision, positive ETCO2 and breath sounds checked- equal and bilateral Secured at: 21 cm Tube secured with: Tape Dental Injury: Teeth and Oropharynx as per pre-operative assessment

## 2023-08-22 NOTE — Plan of Care (Signed)
  Problem: Education: Goal: Ability to describe self-care measures that may prevent or decrease complications (Diabetes Survival Skills Education) will improve Outcome: Progressing   Problem: Coping: Goal: Ability to adjust to condition or change in health will improve Outcome: Progressing   Problem: Fluid Volume: Goal: Ability to maintain a balanced intake and output will improve Outcome: Progressing   Problem: Nutritional: Goal: Maintenance of adequate nutrition will improve Outcome: Progressing   Problem: Education: Goal: Knowledge of General Education information will improve Description: Including pain rating scale, medication(s)/side effects and non-pharmacologic comfort measures Outcome: Progressing   Problem: Health Behavior/Discharge Planning: Goal: Ability to manage health-related needs will improve Outcome: Progressing   Problem: Metabolic: Goal: Ability to maintain appropriate glucose levels will improve Outcome: Not Progressing

## 2023-08-22 NOTE — Anesthesia Preprocedure Evaluation (Addendum)
 Anesthesia Evaluation  Patient identified by MRN, date of birth, ID band Patient awake    Reviewed: Allergy & Precautions, NPO status , Patient's Chart, lab work & pertinent test results  Airway Mallampati: I  TM Distance: <3 FB Neck ROM: Full    Dental  (+) Edentulous Upper, Edentulous Lower   Pulmonary asthma , COPD,  COPD inhaler, former smoker    + decreased breath sounds      Cardiovascular hypertension, Pt. on medications + Peripheral Vascular Disease   Rhythm:Regular Rate:Normal  Echo:   1. Left ventricular ejection fraction, by estimation, is 50 to 55%. The  left ventricle has low normal function. The left ventricle has no regional  wall motion abnormalities. Left ventricular diastolic parameters were  normal.   2. Right ventricular systolic function is normal. The right ventricular  size is normal. Tricuspid regurgitation signal is inadequate for assessing  PA pressure.   3. Right atrial size was mildly dilated.   4. The mitral valve is normal in structure. No evidence of mitral valve  regurgitation. No evidence of mitral stenosis.   5. The aortic valve was not well visualized. Aortic valve regurgitation  is not visualized. No aortic stenosis is present.   6. The inferior vena cava is normal in size with greater than 50%  respiratory variability, suggesting right atrial pressure of 3 mmHg.     Neuro/Psych  PSYCHIATRIC DISORDERS Anxiety Depression    negative neurological ROS     GI/Hepatic Neg liver ROS,GERD  ,,  Endo/Other  diabetes, Type 2, Insulin Dependent, Oral Hypoglycemic Agents    Renal/GU negative Renal ROS     Musculoskeletal negative musculoskeletal ROS (+)    Abdominal   Peds  Hematology negative hematology ROS (+)   Anesthesia Other Findings   Reproductive/Obstetrics negative OB ROS                             Anesthesia Physical Anesthesia Plan  ASA:  3  Anesthesia Plan: General   Post-op Pain Management: Tylenol PO (pre-op)*   Induction: Intravenous  PONV Risk Score and Plan: 4 or greater and Ondansetron, Dexamethasone and Midazolam  Airway Management Planned: Oral ETT  Additional Equipment: Arterial line  Intra-op Plan:   Post-operative Plan: Extubation in OR  Informed Consent: I have reviewed the patients History and Physical, chart, labs and discussed the procedure including the risks, benefits and alternatives for the proposed anesthesia with the patient or authorized representative who has indicated his/her understanding and acceptance.     Dental advisory given  Plan Discussed with: CRNA  Anesthesia Plan Comments:        Anesthesia Quick Evaluation

## 2023-08-22 NOTE — Discharge Instructions (Signed)

## 2023-08-23 ENCOUNTER — Encounter (HOSPITAL_COMMUNITY): Payer: Self-pay | Admitting: Surgery

## 2023-08-23 LAB — GLUCOSE, CAPILLARY
Glucose-Capillary: 203 mg/dL — ABNORMAL HIGH (ref 70–99)
Glucose-Capillary: 175 mg/dL — ABNORMAL HIGH (ref 70–99)
Glucose-Capillary: 257 mg/dL — ABNORMAL HIGH (ref 70–99)
Glucose-Capillary: 230 mg/dL — ABNORMAL HIGH (ref 70–99)

## 2023-08-23 LAB — POCT I-STAT, CHEM 8
BUN: 13 mg/dL (ref 8–23)
Calcium, Ion: 1.21 mmol/L (ref 1.15–1.40)
Chloride: 101 mmol/L (ref 98–111)
Creatinine, Ser: 0.4 mg/dL — ABNORMAL LOW (ref 0.44–1.00)
Glucose, Bld: 213 mg/dL — ABNORMAL HIGH (ref 70–99)
HCT: 39 % (ref 36.0–46.0)
Hemoglobin: 13.3 g/dL (ref 12.0–15.0)
Potassium: 3.4 mmol/L — ABNORMAL LOW (ref 3.5–5.1)
Sodium: 137 mmol/L (ref 135–145)
TCO2: 27 mmol/L (ref 22–32)

## 2023-08-23 LAB — BASIC METABOLIC PANEL
Anion gap: 12 (ref 5–15)
BUN: 16 mg/dL (ref 8–23)
CO2: 24 mmol/L (ref 22–32)
Calcium: 8.6 mg/dL — ABNORMAL LOW (ref 8.9–10.3)
Chloride: 98 mmol/L (ref 98–111)
Creatinine, Ser: 0.74 mg/dL (ref 0.44–1.00)
GFR, Estimated: >60 mL/min (ref >60)
Glucose, Bld: 175 mg/dL — ABNORMAL HIGH (ref 70–99)
Potassium: 4.4 mmol/L (ref 3.5–5.1)
Sodium: 134 mmol/L — ABNORMAL LOW (ref 135–145)

## 2023-08-23 LAB — LIPID PANEL
Cholesterol: 181 mg/dL (ref 0–200)
HDL: 25 mg/dL — ABNORMAL LOW (ref >40)
LDL Cholesterol: 90 mg/dL (ref 0–99)
Total CHOL/HDL Ratio: 7.2 ratio
Triglycerides: 329 mg/dL — ABNORMAL HIGH (ref <150)
VLDL: 66 mg/dL — ABNORMAL HIGH (ref 0–40)

## 2023-08-23 LAB — CBC
HCT: 38.5 % (ref 36.0–46.0)
Hemoglobin: 12.2 g/dL (ref 12.0–15.0)
MCH: 28.8 pg (ref 26.0–34.0)
MCHC: 31.7 g/dL (ref 30.0–36.0)
MCV: 90.8 fL (ref 80.0–100.0)
Platelets: 318 10*3/uL (ref 150–400)
RBC: 4.24 MIL/uL (ref 3.87–5.11)
RDW: 13.2 % (ref 11.5–15.5)
WBC: 14.1 10*3/uL — ABNORMAL HIGH (ref 4.0–10.5)
nRBC: 0 % (ref 0.0–0.2)

## 2023-08-23 LAB — POCT ACTIVATED CLOTTING TIME
Activated Clotting Time: 227 s
Activated Clotting Time: 285 s
Activated Clotting Time: 84 s
Activated Clotting Time: 89 s

## 2023-08-23 NOTE — Progress Notes (Signed)
 A-line wound not draw back for AM specimen collection. A-line pulled per MD order. Phlebotomy notified by RN to collect patient labs.

## 2023-08-23 NOTE — Evaluation (Addendum)
 Physical Therapy Evaluation Patient Details Name: Vanessa [name] MRN: [id number] DOB: [birth year] Today's Date: 08/23/2023  History of Present Illness  63 y.o. female presents to [facility] 08/22/23 for SFA bypass of L LE. 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 PTA, pt was independent with no AD. In today's session, pt was able to perform bed mobility with supervision and stand with MinA and RW. Pt was limited by pain in L LE and declined wanting to ambulate. Pt was able to take side-steps towards [facility] with CGA and RW. Pt has 24/7 physical assist available from son and daughter upon d/c home from the hospital. Family was present during the session and feel comfortable assisting. Pt currently with functional limitations due to the deficits listed below (see PT Problem List). Pt would benefit from acute skilled PT to address functional impairments. Recommending post-acute HHPT to work towards independence with mobility. Acute PT to follow.         If plan is discharge home, recommend the following: A little help with bathing/dressing/bathroom;A lot of help with walking and/or transfers;Assistance with cooking/housework;Assist for transportation;Help with stairs or ramp for entrance   Can travel by private vehicle    Yes    Equipment Recommendations None recommended by PT (pt owns equipment)     Functional Status Assessment Patient has had a recent decline in their functional status and demonstrates the ability to make significant improvements in function in a reasonable and predictable amount of time.     Precautions / Restrictions Precautions Precautions: Fall Recall of Precautions/Restrictions: Intact Restrictions Weight Bearing Restrictions Per Provider Order: No      Mobility  Bed  Mobility Overal bed mobility: Needs Assistance Bed Mobility: Supine to Sit, Sit to Supine    Supine to sit: Supervision, HOB elevated, Used rails Sit to supine: Supervision, HOB elevated   General bed mobility comments: increased time and effort 2/2 pain    Transfers Overall transfer level: Needs assistance Equipment used: Rolling walker (2 wheels) Transfers: Sit to/from Stand Sit to Stand: Min assist   General transfer comment: MinA for boost up from EOB. Cues for hand placement with RW. Able to side step towards [facility]    Ambulation/Gait    General Gait Details: deferred 2/2 pain    Balance Overall balance assessment: Needs assistance Sitting-balance support: No upper extremity supported, Feet supported Sitting balance-Leahy Scale: Fair     Standing balance support: Bilateral upper extremity supported, During functional activity, Reliant on assistive device for balance Standing balance-Leahy Scale: Poor Standing balance comment: reliant on RW        Pertinent Vitals/Pain Pain Assessment Pain Assessment: Faces Faces Pain Scale: Hurts whole lot Pain Location: L leg from knee to hip Pain Descriptors / Indicators: Sore Pain Intervention(s): Limited activity within patient's tolerance, Monitored during session, Repositioned    Home Living Family/patient expects to be discharged to:: Private residence Living Arrangements: Spouse/significant other Available Help at Discharge: Family;Available PRN/intermittently (Son and daughter can help if physical assist is needed. Husband is unable to help physically due to medical issues) Type of Home: Mobile home Home Access: Ramped entrance     Home Layout: One level (1 step down into laundry room and dining room) Home Equipment: Agricultural consultant (2 wheels);Shower seat;BSC/3in1;Wheelchair - manual;Grab bars - tub/shower;Grab bars -  toilet;Hand held shower head;Transport chair;Cane - single point;Crutches;Rollator (4 wheels)       Prior Function Prior Level of Function : Independent/Modified Independent      Mobility Comments: Ambulates without AD for short distances, may use WC in community ADLs Comments: Does use the shower chair when bathing.     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 At least 3/5 MMT    Cervical / Trunk Assessment Cervical / Trunk Assessment: Other exceptions Cervical / Trunk Exceptions: body habitus  Communication   Communication Communication: No apparent difficulties    Cognition Arousal: Alert Behavior During Therapy: WFL for tasks assessed/performed   PT - Cognitive impairments: No apparent impairments    Following commands: Intact       Cueing Cueing Techniques: Verbal cues     General Comments General comments (skin integrity, edema, etc.): on 3L upon arrival and exit        PT Assessment Patient needs continued PT services  PT Problem List Decreased strength;Decreased range of motion;Decreased activity tolerance;Decreased mobility;Decreased balance;Obesity       PT Treatment Interventions DME instruction;Gait training;Functional mobility training;Therapeutic activities;Therapeutic exercise;Balance training;Neuromuscular re-education;Wheelchair mobility training;Patient/family education    PT Goals (Current goals can be found in the Care Plan section)  Acute Rehab PT Goals Patient Stated Goal: to be able to walk PT Goal Formulation: With patient/family Time For Goal Achievement: 09/06/23 Potential to Achieve Goals: Good    Frequency Min 1X/week        AM-PAC PT "6 Clicks" Mobility  Outcome Measure Help needed turning from your back to your side while in a flat bed  without using bedrails?: A Little Help needed moving from lying on your back to sitting on the side of a flat bed without using bedrails?: A Little Help needed moving to and from a bed to a chair (including a wheelchair)?: A Little Help needed standing up from a chair using your arms (e.g., wheelchair or bedside chair)?: A Little Help needed to walk in hospital room?: A Lot Help needed climbing 3-5 steps with a railing? : A Lot 6 Click Score: 16    End of Session Equipment Utilized During Treatment: Oxygen Activity Tolerance: Patient limited by pain Patient left: in bed;with call bell/phone within reach;with family/visitor present;with nursing/sitter in room Nurse Communication: Mobility status PT Visit Diagnosis: Other abnormalities of gait and mobility (R26.89);Muscle weakness (generalized) (M62.81)    Time: 9562-1308 PT Time Calculation (min) (ACUTE ONLY): 27 min   Charges:   PT Evaluation $PT Eval Low Complexity: 1 Low PT Treatments $Therapeutic Activity: 8-22 mins PT General Charges $$ ACUTE PT VISIT: 1 Visit    Hilton Cork, PT, DPT Secure Chat Preferred  Rehab Office [phone]   Arturo Morton Brion Aliment 08/23/2023, 1:37 PM

## 2023-08-23 NOTE — Anesthesia Postprocedure Evaluation (Signed)
 Anesthesia Post Note  Patient: Vanessa [name]  Procedure(s) Performed: LEFT BYPASS GRAFT FEMORAL-POPLITEAL ARTERY (Left: Groin) LEFT ENDARTERECTOMY FEMORAL (Left: Groin) INSERTION OF ILIAC STENT - LEFT (Left: Groin)     Patient location during evaluation: PACU Anesthesia Type: General Level of consciousness: awake and alert Pain management: pain level controlled Vital Signs Assessment: post-procedure vital signs reviewed and stable Respiratory status: spontaneous breathing, nonlabored ventilation, respiratory function stable and patient connected to nasal cannula oxygen Cardiovascular status: blood pressure returned to baseline and stable Postop Assessment: no apparent nausea or vomiting Anesthetic complications: no   No notable events documented.               Shelton Silvas

## 2023-08-23 NOTE — Progress Notes (Addendum)
  Progress Note    08/23/2023 6:36 AM 1 Day Post-Op  Subjective:  says she is having pain.   Afebrile HR 80's-100's NSR 90's-100's systolic 95% 3LON2C  Vitals:   08/23/23 0400 08/23/23 0445  BP: (!) 98/52 (!) 108/57  Pulse: 91 95  Resp: 18   Temp: 98.9 F (37.2 C)   SpO2: 95% 97%    Physical Exam: General:  no distress Cardiac:  regular Lungs:  non labored Incisions:  left groin and left AK incisions are clean and dry Extremities:  brisk left AT and PT doppler flow; calf is soft   CBC    Component Value Date/Time   WBC 10.4 08/22/2023 0811   RBC 5.04 08/22/2023 0811   HGB 14.6 08/22/2023 0811   HGB 17.0 (H) 09/21/2021 1543   HCT 43.5 08/22/2023 0811   HCT 49.4 (H) 09/21/2021 1543   PLT 410 (H) 08/22/2023 0811   PLT 464 (H) 09/21/2021 1543   MCV 86.3 08/22/2023 0811   MCV 88 09/21/2021 1543   MCH 29.0 08/22/2023 0811   MCHC 33.6 08/22/2023 0811   RDW 13.0 08/22/2023 0811   RDW 12.1 09/21/2021 1543   LYMPHSABS 3.2 12/06/2022 0052   LYMPHSABS 3.1 09/21/2021 1543   MONOABS 1.0 12/06/2022 0052   EOSABS 1.0 (H) 12/06/2022 0052   EOSABS 0.4 09/21/2021 1543   BASOSABS 0.1 12/06/2022 0052   BASOSABS 0.1 09/21/2021 1543    BMET    Component Value Date/Time   NA 132 (L) 08/22/2023 0811   NA 132 (L) 06/12/2023 1653   K 4.4 08/22/2023 0811   CL 96 (L) 08/22/2023 0811   CO2 25 08/22/2023 0811   GLUCOSE 329 (H) 08/22/2023 0811   BUN 14 08/22/2023 0811   BUN 10 06/12/2023 1653   CREATININE 0.46 08/22/2023 0811   CREATININE 0.56 11/04/2021 1019   CREATININE 0.45 (L) 12/03/2014 1542   CALCIUM 9.2 08/22/2023 0811   GFRNONAA >60 08/22/2023 0811   GFRNONAA >60 11/04/2021 1019   GFRNONAA >89 12/03/2014 1542   GFRAA 125 03/11/2020 1644   GFRAA >89 12/03/2014 1542    INR    Component Value Date/Time   INR 0.9 08/22/2023 0811     Intake/Output Summary (Last 24 hours) at 08/23/2023 0636 Last data filed at 08/23/2023 0550 Gross per 24 hour  Intake 2669.41  ml  Output 650 ml  Net 2019.41 ml      Assessment/Plan:  63 y.o. female is s/p:  : Left femoral to above-knee popliteal artery bypass graft with 6 mm external ring PTFE and left external iliac stent 08/22/2023 by Dr. Myra Gianotti for left leg ulcer  1 Day Post-Op   -pt doing well this morning with brisk left AT and PT doppler flow. Left calf is soft. -start mobilizing this morning -incisions look good.  Dry gauze to left groin to wick moisture to prevent wound infection.  Discussed with RN.  -labs pending -continue asa/statin. Plavix to restart this morning. -DVT prophylaxis:  sq heparin to start this am   Doreatha Massed, PA-C [facility] [phone] 08/23/2023 6:36 AM  I agree with the above.  She has excellent Doppler signals.  Her incisions are healing nicely without hematoma.  I discussed the importance of mobilization today especially ambulating in the hallways.  Will restart her Plavix.  [name]

## 2023-08-23 NOTE — Evaluation (Signed)
 Occupational Therapy Evaluation Patient Details Name: Vanessa [name] MRN: [id number] DOB: 11-28-1960 Today's Date: 08/23/2023   History of Present Illness   63 year old female with history of right axillary femoral bypass graft, left foot ulcer who recently underwent left iliac stenting now s/p SFA bypass of the left leg. PMH: DM2. HLD, anxiety, depression, tobacco dependence, COPD, IBS     Clinical Impressions Patient is s/p LLE SFA bypass resulting in functional limitations due to the deficits listed below (see OT Problem List). Prior to procedure, pt was living at home with husband, independent-Mod I for ADL tasks and functional mobility. Patient will benefit from acute skilled OT to increase their safety and independence with ADL and functional mobility for ADL to allow facilitate discharge. Recommend follow up home health OT services when discharged to further increase, pt overall independence and functional performance when completing daily tasks. OT will continue to follow pt acutely.       If plan is discharge home, recommend the following:   A little help with walking and/or transfers;A lot of help with bathing/dressing/bathroom;Assistance with cooking/housework;Assist for transportation;Help with stairs or ramp for entrance     Functional Status Assessment   Patient has had a recent decline in their functional status and demonstrates the ability to make significant improvements in function in a reasonable and predictable amount of time.     Equipment Recommendations   None recommended by OT      Precautions/Restrictions   Precautions Precautions: Fall Recall of Precautions/Restrictions: Intact Restrictions Weight Bearing Restrictions Per Provider Order: No     Mobility Bed Mobility Overal bed mobility: Needs Assistance Bed Mobility: Supine to Sit     Supine to sit: Supervision, HOB elevated, Used rails     General bed mobility comments: increased time and  effort due to pain level    Transfers Overall transfer level: Needs assistance   Transfers: Sit to/from Stand, Bed to chair/wheelchair/BSC Sit to Stand: Min assist, From elevated surface     Step pivot transfers: Contact guard assist     General transfer comment: VC for hand placement with RW management prior to sit<>stand transition. VC for sequencing and direction      Balance Overall balance assessment: Needs assistance Sitting-balance support: Single extremity supported, Feet supported Sitting balance-Leahy Scale: Fair Sitting balance - Comments: EOB sitting   Standing balance support: Bilateral upper extremity supported, During functional activity, Reliant on assistive device for balance Standing balance-Leahy Scale: Poor      ADL either performed or assessed with clinical judgement   ADL Overall ADL's : Needs assistance/impaired Eating/Feeding: Modified independent;Bed level   Grooming: Wash/dry face;Wash/dry hands;Set up;Bed level   Upper Body Bathing: Set up;Sitting   Lower Body Bathing: Maximal assistance;Sit to/from stand;Sitting/lateral leans   Upper Body Dressing : Set up;Sitting   Lower Body Dressing: Total assistance;Bed level Lower Body Dressing Details (indicate cue type and reason): to don hospital socks Toilet Transfer: Cueing for safety;Minimal assistance;Cueing for sequencing;BSC/3in1;Rolling walker (2 wheels)   Toileting- Clothing Manipulation and Hygiene: Total assistance;Sit to/from stand        Vision Baseline Vision/History: 1 Wears glasses Ability to See in Adequate Light: 0 Adequate Patient Visual Report: No change from baseline Vision Assessment?: Wears glasses for reading     Perception Perception: Not tested       Praxis Praxis: Not tested       Pertinent Vitals/Pain Pain Assessment Pain Assessment: 0-10 Pain Score: 7  Pain Location: L leg from knee to hip  Pain Descriptors / Indicators: Sore Pain Intervention(s):  Monitored during session, Patient requesting pain meds-RN notified, Limited activity within patient's tolerance     Extremity/Trunk Assessment Upper Extremity Assessment Upper Extremity Assessment: Right hand dominant;Overall [facility] for tasks assessed   Lower Extremity Assessment Lower Extremity Assessment: Defer to PT evaluation   Cervical / Trunk Assessment Cervical / Trunk Assessment: Other exceptions Cervical / Trunk Exceptions: body habitus   Communication Communication Communication: No apparent difficulties   Cognition Arousal: Alert Behavior During Therapy: WFL for tasks assessed/performed Cognition: No apparent impairments        Following commands: Intact       Cueing  General Comments   Cueing Techniques: Verbal cues              Home Living Family/patient expects to be discharged to:: Private residence Living Arrangements: Spouse/significant other Available Help at Discharge: Family;Available PRN/intermittently (Son and daughter can help if physical assist is needed. Husband is unable to help physically due to medical issues) Type of Home: Mobile home Home Access: Ramped entrance     Home Layout: One level (1 step down into laundry room and dining room)     Bathroom Shower/Tub: Producer, television/film/video: Standard Bathroom Accessibility: Yes How Accessible: Accessible via walker Home Equipment: Agricultural consultant (2 wheels);Shower seat;BSC/3in1;Wheelchair - manual;Grab bars - tub/shower;Grab bars - toilet;Hand held shower head;Transport chair;Cane - single point;Crutches;Rollator (4 wheels)          Prior Functioning/Environment Prior Level of Function : Independent/Modified Independent      Mobility Comments: Ambulates without AD ADLs Comments: Does use the shower chair when bathing.    OT Problem List: Pain;Decreased activity tolerance;Impaired balance (sitting and/or standing);Decreased knowledge of use of DME or AE   OT  Treatment/Interventions: Self-care/ADL training;Therapeutic exercise;Therapeutic activities;DME and/or AE instruction;Patient/family education;Balance training;Manual therapy      OT Goals(Current goals can be found in the care plan section)   Acute Rehab OT Goals Patient Stated Goal: to get out of bed OT Goal Formulation: With patient Time For Goal Achievement: 09/06/23 Potential to Achieve Goals: Good   OT Frequency:  Min 1X/week       AM-PAC OT "6 Clicks" Daily Activity     Outcome Measure Help from another person eating meals?: None Help from another person taking care of personal grooming?: None Help from another person toileting, which includes using toliet, bedpan, or urinal?: A Lot Help from another person bathing (including washing, rinsing, drying)?: A Lot Help from another person to put on and taking off regular upper body clothing?: A Little Help from another person to put on and taking off regular lower body clothing?: Total 6 Click Score: 16   End of Session Equipment Utilized During Treatment: Gait belt;Rolling walker (2 wheels);Oxygen Nurse Communication: Mobility status;Patient requests pain meds;Other (comment) (vitals during session)  Activity Tolerance: Patient tolerated treatment well;Patient limited by pain Patient left: in chair;with call bell/phone within reach;with chair alarm set  OT Visit Diagnosis: Unsteadiness on feet (R26.81);Muscle weakness (generalized) (M62.81);Pain Pain - Right/Left: Left Pain - part of body: Leg                Time: 1610-9604 OT Time Calculation (min): 49 min Charges:  OT General Charges $OT Visit: 1 Visit OT Evaluation $OT Eval High Complexity: 1 High OT Treatments $Self Care/Home Management : 8-22 mins $Therapeutic Activity: 8-22 mins  [employer], OTR/L,CBIS  Supplemental OT - [facility] and [facility] Secure Chat Preferred    Gedeon Brandow,  Charisse March 08/23/2023, 9:48 AM

## 2023-08-24 LAB — GLUCOSE, CAPILLARY
Glucose-Capillary: 233 mg/dL — ABNORMAL HIGH (ref 70–99)
Glucose-Capillary: 166 mg/dL — ABNORMAL HIGH (ref 70–99)
Glucose-Capillary: 127 mg/dL — ABNORMAL HIGH (ref 70–99)
Glucose-Capillary: 155 mg/dL — ABNORMAL HIGH (ref 70–99)

## 2023-08-24 NOTE — Plan of Care (Addendum)
 Patient alert and oriented. Pt continues to need oxygen supplement, titrated O2 to 2L [place] and sats in the 90s. Left groin and left inner thigh incision site CDI. Pt continues to experience pain on left leg, PRN pain meds given as needed. Pt only able to transfer to [facility] with contact guard due to L leg weakness. Will continue to monitor pt.   Problem: Fluid Volume: Goal: Ability to maintain a balanced intake and output will improve Outcome: Progressing   Problem: Metabolic: Goal: Ability to maintain appropriate glucose levels will improve Outcome: Progressing   Problem: Skin Integrity: Goal: Risk for impaired skin integrity will decrease Outcome: Progressing   Problem: Pain Managment: Goal: General experience of comfort will improve and/or be controlled Outcome: Progressing   Problem: Education: Goal: Knowledge of prescribed regimen will improve Outcome: Progressing   Problem: Activity: Goal: Ability to tolerate increased activity will improve Outcome: Progressing   Problem: Bowel/Gastric: Goal: Gastrointestinal status for postoperative course will improve Outcome: Progressing   Problem: Clinical Measurements: Goal: Postoperative complications will be avoided or minimized Outcome: Progressing Goal: Signs and symptoms of graft occlusion will improve Outcome: Progressing   Problem: Skin Integrity: Goal: Demonstration of wound healing without infection will improve Outcome: Progressing

## 2023-08-24 NOTE — Plan of Care (Signed)
  Problem: Education: Goal: Ability to describe self-care measures that may prevent or decrease complications (Diabetes Survival Skills Education) will improve Outcome: Progressing   Problem: Coping: Goal: Ability to adjust to condition or change in health will improve Outcome: Progressing   Problem: Fluid Volume: Goal: Ability to maintain a balanced intake and output will improve Outcome: Progressing   Problem: Health Behavior/Discharge Planning: Goal: Ability to manage health-related needs will improve Outcome: Progressing   Problem: Metabolic: Goal: Ability to maintain appropriate glucose levels will improve Outcome: Progressing   Problem: Nutritional: Goal: Maintenance of adequate nutrition will improve Outcome: Progressing   Problem: Skin Integrity: Goal: Risk for impaired skin integrity will decrease Outcome: Progressing   Problem: Tissue Perfusion: Goal: Adequacy of tissue perfusion will improve Outcome: Progressing   Problem: Education: Goal: Knowledge of General Education information will improve Description: Including pain rating scale, medication(s)/side effects and non-pharmacologic comfort measures Outcome: Progressing

## 2023-08-24 NOTE — Progress Notes (Addendum)
  Progress Note    08/24/2023 7:59 AM 2 Days Post-Op  Subjective:  pain in L leg   Vitals:   08/24/23 0700 08/24/23 0724  BP: (!) 139/46 135/62  Pulse: (!) 113 (!) 112  Resp: 20 20  Temp:  99 F (37.2 C)  SpO2: 90% 93%   Physical Exam: Lungs:  non labored Incisions:  L groin and AK pop incision c/d/i Extremities:  brisk L PT by doppler Neurologic: A&O  CBC    Component Value Date/Time   WBC 14.1 (H) 08/23/2023 1331   RBC 4.24 08/23/2023 1331   HGB 12.2 08/23/2023 1331   HGB 17.0 (H) 09/21/2021 1543   HCT 38.5 08/23/2023 1331   HCT 49.4 (H) 09/21/2021 1543   PLT 318 08/23/2023 1331   PLT 464 (H) 09/21/2021 1543   MCV 90.8 08/23/2023 1331   MCV 88 09/21/2021 1543   MCH 28.8 08/23/2023 1331   MCHC 31.7 08/23/2023 1331   RDW 13.2 08/23/2023 1331   RDW 12.1 09/21/2021 1543   LYMPHSABS 3.2 12/06/2022 0052   LYMPHSABS 3.1 09/21/2021 1543   MONOABS 1.0 12/06/2022 0052   EOSABS 1.0 (H) 12/06/2022 0052   EOSABS 0.4 09/21/2021 1543   BASOSABS 0.1 12/06/2022 0052   BASOSABS 0.1 09/21/2021 1543    BMET    Component Value Date/Time   NA 134 (L) 08/23/2023 1331   NA 132 (L) 06/12/2023 1653   K 4.4 08/23/2023 1331   CL 98 08/23/2023 1331   CO2 24 08/23/2023 1331   GLUCOSE 175 (H) 08/23/2023 1331   BUN 16 08/23/2023 1331   BUN 10 06/12/2023 1653   CREATININE 0.74 08/23/2023 1331   CREATININE 0.56 11/04/2021 1019   CREATININE 0.45 (L) 12/03/2014 1542   CALCIUM 8.6 (L) 08/23/2023 1331   GFRNONAA >60 08/23/2023 1331   GFRNONAA >60 11/04/2021 1019   GFRNONAA >89 12/03/2014 1542   GFRAA 125 03/11/2020 1644   GFRAA >89 12/03/2014 1542    INR    Component Value Date/Time   INR 0.9 08/22/2023 0811     Intake/Output Summary (Last 24 hours) at 08/24/2023 0759 Last data filed at 08/24/2023 0057 Gross per 24 hour  Intake 480 ml  Output 750 ml  Net -270 ml     Assessment/Plan:  63 y.o. female is s/p L femoral to AK pop bypass with iliac stenting 2 Days Post-Op    L foot well perfused with brisk L PT signal into the foot Incisions are well appearing; continue dry gauze to groin incision, no tape Continue plavix, aspirin, statin Therapy recommending HH; home when mobility improved   Vanessa Rutter, PA-C [facility] [phone] 08/24/2023 7:59 AM

## 2023-08-24 NOTE — TOC Initial Note (Signed)
 Transition of Care (TOC) - Initial/Assessment Note  Donn Pierini RN, BSN Transitions of Care Unit 4E- RN Case Manager See Treatment Team for direct phone #   Patient Details  Name: Vanessa [name] MRN: [id number] Date of Birth: 10/17/1960  Transition of Care [facility]) CM/SW Contact:    Darrold Span, RN Phone Number: 08/24/2023, 3:54 PM  Clinical Narrative:                 Order placed for HPT  CM in to speak with pt at bedside- pt agreeable to [facility] services- list provided for [facility] choice Per CMS guidelines from [email or web address] website with star ratings (copy placed in shadow chart)- pt voiced she does not have a preference.  [facility] liaison had notified CM that [facility] office referred pt to them for [facility] needs- will have liaison look to see if they can service.   Pt voiced she has needed DME at home- no new needs at this time.  Family will transport home.  Address, phone # confirmed.   Referral pending to [facility] for [facility]  Expected Discharge Plan: Home w Home Health Services Barriers to Discharge: Continued Medical Work up   Patient Goals and CMS Choice Patient states their goals for this hospitalization and ongoing recovery are:: return home CMS Medicare.gov Compare Post Acute Care list provided to:: Patient Choice offered to / list presented to : Patient      Expected Discharge Plan and Services   Discharge Planning Services: CM Consult Post Acute Care Choice: Home Health Living arrangements for the past 2 months: Single Family Home                 DME Arranged: N/A DME Agency: NA       HH Arranged: PT   Date HH Agency Contacted: 08/24/23 Time HH Agency Contacted: 1553    Prior Living Arrangements/Services Living arrangements for the past 2 months: Single Family Home Lives with:: Spouse Patient language and need for interpreter reviewed:: Yes        Need for Family Participation in Patient Care: Yes (Comment) Care giver support system in place?: Yes  (comment) Current home services: DME (rolling walker) Criminal Activity/Legal Involvement Pertinent to Current Situation/Hospitalization: No - Comment as needed  Activities of Daily Living   ADL Screening (condition at time of admission) Independently performs ADLs?: Yes (appropriate for developmental age) Is the patient deaf or have difficulty hearing?: No Does the patient have difficulty seeing, even when wearing glasses/contacts?: No Does the patient have difficulty concentrating, remembering, or making decisions?: No  Permission Sought/Granted Permission sought to share information with : Facility Industrial/product designer granted to share information with : Yes, Verbal Permission Granted     Permission granted to share info w AGENCY: HH        Emotional Assessment Appearance:: Appears stated age Attitude/Demeanor/Rapport: Engaged Affect (typically observed): Accepting Orientation: : Oriented to Self, Oriented to Place, Oriented to  Time, Oriented to Situation Alcohol / Substance Use: Not Applicable Psych Involvement: No (comment)  Admission diagnosis:  Critical lower limb ischemia (HCC) [I70.229] Patient Active Problem List   Diagnosis Date Noted   Critical lower limb ischemia (HCC) 08/22/2023   Heart failure with mildly reduced ejection fraction (HFmrEF) (HCC) 12/17/2022   Gangrene of toe of right foot (HCC) 11/29/2022   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) 11/22/2022   Peripheral arterial disease with history of revascularization (HCC) 05/31/2022   Genetic testing 01/02/2022   Ovarian cancer (HCC)  12/02/2021   Seasonal allergies 09/23/2021   Pelvic mass 05/12/2020   Elevated CEA 05/12/2020   Polycythemia, 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   Umbilical hernia without obstruction and without gangrene 08/09/2017   Essential hypertension, benign 03/11/2014   Hepatic steatosis 05/30/2013   Irritable bowel syndrome (IBS) 01/09/2013   Healthcare maintenance  01/09/2013   Baker's cyst of knee 03/20/2011   COPD (chronic obstructive pulmonary disease) (HCC) 12/01/2010   Anxiety and depression 07/23/2008   Tobacco dependence 10/23/2007   HLD (hyperlipidemia) 06/14/2006   ASTHMA 06/14/2006   GERD 06/14/2006   Diabetes mellitus, type II, insulin dependent (HCC) 06/26/2002   PCP:  Rocky Morel, DO Pharmacy:   [id number] - [place] - [address] [place] [address] Phone: [phone] Fax: [phone]  [facility] - [place], [place] - [address] [place] [address] Phone: [phone] Fax: [phone]  [facility] [address] [place] [address] Phone: [phone] Fax: [phone]     Social Drivers of Health (SDOH) Social History: SDOH Screenings   Food Insecurity: No Food Insecurity (08/22/2023)  Housing: Low Risk  (08/22/2023)  Transportation Needs: No Transportation Needs (08/22/2023)  Utilities: Not At Risk (08/22/2023)  Depression (PHQ2-9): Low Risk  (12/14/2022)  Recent Concern: Depression (PHQ2-9) - High Risk (10/25/2022)  Social Connections: Moderately Isolated (10/25/2022)  Tobacco Use: Medium Risk (08/22/2023)   SDOH Interventions:     Readmission Risk Interventions    12/06/2022   10:13 AM 11/23/2022   10:35 AM 11/22/2022    4:12 PM  Readmission Risk Prevention Plan  Transportation Screening Complete Complete Complete  PCP or Specialist Appt within 5-7 Days  Complete Complete  Home Care Screening  Complete Complete  Medication Review (RN CM)  Complete Complete  HRI or Home Care Consult Complete    Social Work Consult for Recovery Care Planning/Counseling Complete    Palliative Care Screening Not Applicable    Medication Review Oceanographer) Complete

## 2023-08-25 LAB — GLUCOSE, CAPILLARY
Glucose-Capillary: 215 mg/dL — ABNORMAL HIGH (ref 70–99)
Glucose-Capillary: 200 mg/dL — ABNORMAL HIGH (ref 70–99)
Glucose-Capillary: 183 mg/dL — ABNORMAL HIGH (ref 70–99)
Glucose-Capillary: 197 mg/dL — ABNORMAL HIGH (ref 70–99)

## 2023-08-25 NOTE — Plan of Care (Signed)
  Problem: Coping: Goal: Ability to adjust to condition or change in health will improve Outcome: Progressing   Problem: Fluid Volume: Goal: Ability to maintain a balanced intake and output will improve Outcome: Progressing   Problem: Nutritional: Goal: Maintenance of adequate nutrition will improve Outcome: Progressing   Problem: Tissue Perfusion: Goal: Adequacy of tissue perfusion will improve Outcome: Progressing

## 2023-08-25 NOTE — Progress Notes (Addendum)
  Progress Note    08/25/2023 10:08 AM 3 Days Post-Op  Subjective:  incisions painful. Says she needs more time here for pain control and mobility   Vitals:   08/25/23 0330 08/25/23 0843  BP: (!) 94/55 (!) 99/56  Pulse: 96 83  Resp: 20 18  Temp: 99.4 F (37.4 C) 99 F (37.2 C)  SpO2: 90% 97%   Physical Exam: Cardiac:  regular Lungs:  non labored Incisions:  Left groin, left thigh incisions are all intact and well appearing, dry gauze tucked into groin to wick moisture Extremities:  LLE well perfused and warm with brisk doppler PT signal Abdomen:  soft Neurologic: alert and oriented  CBC    Component Value Date/Time   WBC 14.1 (H) 08/23/2023 1331   RBC 4.24 08/23/2023 1331   HGB 12.2 08/23/2023 1331   HGB 17.0 (H) 09/21/2021 1543   HCT 38.5 08/23/2023 1331   HCT 49.4 (H) 09/21/2021 1543   PLT 318 08/23/2023 1331   PLT 464 (H) 09/21/2021 1543   MCV 90.8 08/23/2023 1331   MCV 88 09/21/2021 1543   MCH 28.8 08/23/2023 1331   MCHC 31.7 08/23/2023 1331   RDW 13.2 08/23/2023 1331   RDW 12.1 09/21/2021 1543   LYMPHSABS 3.2 12/06/2022 0052   LYMPHSABS 3.1 09/21/2021 1543   MONOABS 1.0 12/06/2022 0052   EOSABS 1.0 (H) 12/06/2022 0052   EOSABS 0.4 09/21/2021 1543   BASOSABS 0.1 12/06/2022 0052   BASOSABS 0.1 09/21/2021 1543    BMET    Component Value Date/Time   NA 134 (L) 08/23/2023 1331   NA 132 (L) 06/12/2023 1653   K 4.4 08/23/2023 1331   CL 98 08/23/2023 1331   CO2 24 08/23/2023 1331   GLUCOSE 175 (H) 08/23/2023 1331   BUN 16 08/23/2023 1331   BUN 10 06/12/2023 1653   CREATININE 0.74 08/23/2023 1331   CREATININE 0.56 11/04/2021 1019   CREATININE 0.45 (L) 12/03/2014 1542   CALCIUM 8.6 (L) 08/23/2023 1331   GFRNONAA >60 08/23/2023 1331   GFRNONAA >60 11/04/2021 1019   GFRNONAA >89 12/03/2014 1542   GFRAA 125 03/11/2020 1644   GFRAA >89 12/03/2014 1542    INR    Component Value Date/Time   INR 0.9 08/22/2023 0811    No intake or output data in  the 24 hours ending 08/25/23 1008   Assessment/Plan:  63 y.o. female is s/p  L femoral to AK pop bypass with iliac stenting  3 Days Post-Op   Feels she needs a couple more days here for pain control and mobility Has all Home DME needs Lives with husband who is physically challenged as amputee so she feels she needs to make sure she is ready and able to care for herself Encourage her to mobilize/ work with mobility specialist and therapy teams Pending [facility] PT/OT arrangement Hopefully she will be ready for d/c Monday   Graceann Congress, [place] [facility] [phone] 08/25/2023 10:08 AM  I agree with the above.  Have seen and evaluated the patient.  She has brisk pedal Doppler signals.  I have encouraged her to get out of bed and walk.  She has been to the bathroom but needs to be active.  Once she clears PT, we can consider discharge likely next week  Wells Reilynn Lauro

## 2023-08-26 LAB — GLUCOSE, CAPILLARY
Glucose-Capillary: 195 mg/dL — ABNORMAL HIGH (ref 70–99)
Glucose-Capillary: 148 mg/dL — ABNORMAL HIGH (ref 70–99)
Glucose-Capillary: 163 mg/dL — ABNORMAL HIGH (ref 70–99)
Glucose-Capillary: 173 mg/dL — ABNORMAL HIGH (ref 70–99)

## 2023-08-26 MED ORDER — ALBUTEROL SULFATE (2.5 MG/3ML) 0.083% IN NEBU: 2.5000 mg | INHALATION_SOLUTION | Freq: Four times a day (QID) | RESPIRATORY_TRACT | Status: DC | PRN

## 2023-08-26 NOTE — Progress Notes (Signed)
 Mobility Specialist Progress Note:    08/26/23 1131  Mobility  Activity Ambulated with assistance in hallway  Level of Assistance Contact guard assist, steadying assist  Assistive Device Front wheel walker  Distance Ambulated (ft) 200 ft  Activity Response Tolerated well  Mobility Referral Yes  Mobility visit 1 Mobility  Mobility Specialist Start Time (ACUTE ONLY) 1127  Mobility Specialist Stop Time (ACUTE ONLY) 1131  Mobility Specialist Time Calculation (min) (ACUTE ONLY) 4 min   Pt received in bed, agreeable to mobility session. Found with vape in lap. Ambulated in hallway with CGA and RW. Tolerated well, c/o BLE pain. Pt ambulated on RA, unreliable pulse ox reading during session. Pt denied SOB. Upon arrival to room, SpO2 88% on RA. Pt now lying comfortably in bed, SpO2 93% on 2L. Left pt in bed with all needs met.   Feliciana Rossetti Mobility Specialist Please contact via Special educational needs teacher or  Rehab office at [phone]

## 2023-08-26 NOTE — Progress Notes (Addendum)
  Progress Note    08/26/2023 7:27 AM 4 Days Post-Op  Subjective:  no complaints this morning. Says she ambulated in her room to commode but hasn't been up in chair or ambulated out of room    Vitals:   08/25/23 2324 08/26/23 0312  BP: 112/60 104/66  Pulse: 97 87  Resp: 18 18  Temp: 98.5 F (36.9 C) 98.5 F (36.9 C)  SpO2: 98% 93%   Physical Exam: Cardiac:  regular Lungs:  non labored Incisions:  left groin, left AK pop incisions are clean, dry and intact. Replaced gauze in left groin Extremities:  LLE well perfused with brisk PT signal Neurologic: alert and oriented  CBC    Component Value Date/Time   WBC 14.1 (H) 08/23/2023 1331   RBC 4.24 08/23/2023 1331   HGB 12.2 08/23/2023 1331   HGB 17.0 (H) 09/21/2021 1543   HCT 38.5 08/23/2023 1331   HCT 49.4 (H) 09/21/2021 1543   PLT 318 08/23/2023 1331   PLT 464 (H) 09/21/2021 1543   MCV 90.8 08/23/2023 1331   MCV 88 09/21/2021 1543   MCH 28.8 08/23/2023 1331   MCHC 31.7 08/23/2023 1331   RDW 13.2 08/23/2023 1331   RDW 12.1 09/21/2021 1543   LYMPHSABS 3.2 12/06/2022 0052   LYMPHSABS 3.1 09/21/2021 1543   MONOABS 1.0 12/06/2022 0052   EOSABS 1.0 (H) 12/06/2022 0052   EOSABS 0.4 09/21/2021 1543   BASOSABS 0.1 12/06/2022 0052   BASOSABS 0.1 09/21/2021 1543    BMET    Component Value Date/Time   NA 134 (L) 08/23/2023 1331   NA 132 (L) 06/12/2023 1653   K 4.4 08/23/2023 1331   CL 98 08/23/2023 1331   CO2 24 08/23/2023 1331   GLUCOSE 175 (H) 08/23/2023 1331   BUN 16 08/23/2023 1331   BUN 10 06/12/2023 1653   CREATININE 0.74 08/23/2023 1331   CREATININE 0.56 11/04/2021 1019   CREATININE 0.45 (L) 12/03/2014 1542   CALCIUM 8.6 (L) 08/23/2023 1331   GFRNONAA >60 08/23/2023 1331   GFRNONAA >60 11/04/2021 1019   GFRNONAA >89 12/03/2014 1542   GFRAA 125 03/11/2020 1644   GFRAA >89 12/03/2014 1542    INR    Component Value Date/Time   INR 0.9 08/22/2023 0811     Intake/Output Summary (Last 24 hours) at  08/26/2023 0727 Last data filed at 08/25/2023 1300 Gross per 24 hour  Intake 480 ml  Output --  Net 480 ml     Assessment/Plan:  63 y.o. female is s/p L femoral to AK pop bypass with iliac stenting  4 Days Post-Op   LLE well perfused and warm with left PT pulse Incisions are intact and well appearing. Dry gauze to left groin to wick moisture Pain control PRN Encourage her to mobilize more   Graceann Congress, [place] [facility] [phone] 08/26/2023 7:27 AM  I agree with the above.  I have seen and evaluated the patient.  She has excellent pedal Doppler signals.  Ulcer pictures are below.  Continue to mobilize.  Offload ulcers to the foot.  Continue Plavix.    [name]

## 2023-08-27 LAB — GLUCOSE, CAPILLARY
Glucose-Capillary: 234 mg/dL — ABNORMAL HIGH (ref 70–99)
Glucose-Capillary: 186 mg/dL — ABNORMAL HIGH (ref 70–99)
Glucose-Capillary: 187 mg/dL — ABNORMAL HIGH (ref 70–99)

## 2023-08-27 NOTE — Progress Notes (Signed)
 PT Cancellation Note  Patient Details Name: Vanessa [name] MRN: [id number] DOB: August 26, 1960   Cancelled Treatment:    Reason Eval/Treat Not Completed: Other (comment). Attempted x 2 and pt reports she was up in room earlier with OT and then up with mobility team. She is mobilizing in room on her own with walker and should be ready for home from PT standpoint when medically ready.   Angelina Ok [facility] 08/27/2023, 2:41 PM Skip Mayer PT Acute [employer] [phone]

## 2023-08-27 NOTE — Progress Notes (Signed)
 Occupational Therapy Treatment/Discharge Patient Details Name: Vanessa [name] MRN: [id number] DOB: 1961/03/23 Today's Date: 08/27/2023   History of present illness 63 y.o. female presents [facility]/22[facility]23 for SFA bypass of L LE. 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 At baseline, pt does not require supplemental O2. SpO2 unable to maintain stable level during session on RA. Able to decrease O2 to 1.5L with SpO2 stable at 94%. Pt able to report that she is moving better and has been walking to/from the bathroom herself. She does not feel that home health therapy is necessary at this point. Discussed available DME at home and no additional acute OT needs identified. OT will sign off.       If plan is discharge home, recommend the following:  A little help with walking and/or transfers;A little help with bathing/dressing/bathroom;Assistance with cooking/housework;Help with stairs or ramp for entrance   Equipment Recommendations  None recommended by OT       Precautions / Restrictions Precautions Precautions: Fall Recall of Precautions/Restrictions: Intact Precaution/Restrictions Comments: Watch SpO2. Doesnt wear O2 at baseline. Try to wean before discharge. Restrictions Weight Bearing Restrictions Per Provider Order: No       Mobility Bed Mobility Overal bed mobility: Needs Assistance Bed Mobility: Supine to Sit, Sit to Supine     Supine to sit: Supervision, HOB elevated, Used rails Sit to supine: Supervision, HOB elevated   General bed mobility comments: increased time and effort    Transfers Overall transfer level: Needs assistance Equipment used: Rolling walker (2 wheels) Transfers: Sit to/from Stand Sit to Stand: Supervision, From elevated surface        Balance  Overall balance assessment: Mild deficits observed, not formally tested        ADL either performed or assessed with clinical judgement   ADL        Toilet Transfer: Supervision/safety;Ambulation;Regular Toilet;Grab bars;Rolling walker (2 wheels)   Toileting- Clothing Manipulation and Hygiene: Modified independent;Sitting/lateral lean;Sit to/from stand       Functional mobility during ADLs: Supervision/safety;Rolling walker (2 wheels) General ADL Comments: Pt reports that she has been walking to/from the toilet herself using RW. She is completing toileting at Mod I level.              Communication Communication Communication: No apparent difficulties   Cognition Arousal: Alert Behavior During Therapy: WFL for tasks assessed/performed Cognition: No apparent impairments     Following commands: Intact                 General Comments On 2.5L O2 [name] upon therapy arrival. While at rest, SpO2 decreased to 87% on RA. O2 replaced via [place] and decreased to 1.5L. SpO2 remained stable at 94%. Nursing made aware.    Pertinent Vitals/ Pain       Pain Assessment Pain Assessment: Faces Faces Pain Scale: Hurts a little bit Pain Location: L leg from knee to hip Pain Descriptors / Indicators: Sore Pain Intervention(s): Limited activity within patient's tolerance, Monitored during session, Repositioned         Frequency  Min 1X/week        Progress Toward Goals  OT Goals(current goals can now be found in the care plan section)  Progress towards OT goals: Goals met/education completed, patient discharged from OT  AM-PAC OT "6 Clicks" Daily Activity     Outcome Measure   Help from another person eating meals?: None Help from another person taking care of personal grooming?: None Help from another person toileting, which includes using toliet, bedpan, or urinal?: None Help from another person bathing (including washing, rinsing, drying)?: A Little Help from  another person to put on and taking off regular upper body clothing?: None Help from another person to put on and taking off regular lower body clothing?: A Little 6 Click Score: 22    End of Session Equipment Utilized During Treatment: Gait belt;Rolling walker (2 wheels);Oxygen  OT Visit Diagnosis: Unsteadiness on feet (R26.81);Muscle weakness (generalized) (M62.81);Pain Pain - Right/Left: Left Pain - part of body: Leg   Activity Tolerance Patient tolerated treatment well   Patient Left in bed;with call bell/phone within reach;with bed alarm set   Nurse Communication Other (comment) (Vitals during session)        Time: 1610-9604 OT Time Calculation (min): 21 min  Charges: OT General Charges $OT Visit: 1 Visit OT Treatments $Self Care/Home Management : 8-22 mins  Limmie Patricia, OTR/L,CBIS  Supplemental OT - [facility] and [facility] Secure Chat Preferred    Reneshia Zuccaro, Charisse March 08/27/2023, 11:33 AM

## 2023-08-27 NOTE — Progress Notes (Signed)
  Progress Note    08/27/2023 8:11 AM 5 Days Post-Op  Subjective:  Feeling better.  Would prefer discharge home tomorrow to coordinate with family   Vitals:   08/26/23 2214 08/27/23 0357  BP: (!) 102/45 (!) 118/45  Pulse: 81 75  Resp: 20 20  Temp: 98.7 F (37.1 C) 98.5 F (36.9 C)  SpO2: 95% 95%   Physical Exam: Lungs:  non labored Incisions:  L groin and popliteal incisions healing well Extremities:  brisk L PT by doppler Neurologic: A&O  CBC    Component Value Date/Time   WBC 14.1 (H) 08/23/2023 1331   RBC 4.24 08/23/2023 1331   HGB 12.2 08/23/2023 1331   HGB 17.0 (H) 09/21/2021 1543   HCT 38.5 08/23/2023 1331   HCT 49.4 (H) 09/21/2021 1543   PLT 318 08/23/2023 1331   PLT 464 (H) 09/21/2021 1543   MCV 90.8 08/23/2023 1331   MCV 88 09/21/2021 1543   MCH 28.8 08/23/2023 1331   MCHC 31.7 08/23/2023 1331   RDW 13.2 08/23/2023 1331   RDW 12.1 09/21/2021 1543   LYMPHSABS 3.2 12/06/2022 0052   LYMPHSABS 3.1 09/21/2021 1543   MONOABS 1.0 12/06/2022 0052   EOSABS 1.0 (H) 12/06/2022 0052   EOSABS 0.4 09/21/2021 1543   BASOSABS 0.1 12/06/2022 0052   BASOSABS 0.1 09/21/2021 1543    BMET    Component Value Date/Time   NA 134 (L) 08/23/2023 1331   NA 132 (L) 06/12/2023 1653   K 4.4 08/23/2023 1331   CL 98 08/23/2023 1331   CO2 24 08/23/2023 1331   GLUCOSE 175 (H) 08/23/2023 1331   BUN 16 08/23/2023 1331   BUN 10 06/12/2023 1653   CREATININE 0.74 08/23/2023 1331   CREATININE 0.56 11/04/2021 1019   CREATININE 0.45 (L) 12/03/2014 1542   CALCIUM 8.6 (L) 08/23/2023 1331   GFRNONAA >60 08/23/2023 1331   GFRNONAA >60 11/04/2021 1019   GFRNONAA >89 12/03/2014 1542   GFRAA 125 03/11/2020 1644   GFRAA >89 12/03/2014 1542    INR    Component Value Date/Time   INR 0.9 08/22/2023 0811    No intake or output data in the 24 hours ending 08/27/23 1324   Assessment/Plan:  63 y.o. female is s/p L femoral to AK pop bypass with iliac stenting 5 Days Post-Op   L  foot well perfused by doppler exam Incisions of the groin and popliteal are healing well Continue plavix, aspirin, statin Patient will discharge home tomorrow.  She will coordinate care and transportation with family today.  She is not interested in [facility] services.   [name], PA-C [facility] [phone] 08/27/2023 8:11 AM

## 2023-08-27 NOTE — TOC Progression Note (Signed)
 Transition of Care (TOC) - Progression Note  Sander Radon, BSN Transitions of Care Unit 4E- RN Case Manager See Treatment Team for direct phone #   Patient Details  Name: Vanessa [name] MRN: [id number] Date of Birth: Jan 23, 1961  Transition of Care [facility]) CM/SW Contact  Zenda Alpers Lenn Sink, RN Phone Number: 08/27/2023, 12:38 PM  Clinical Narrative:    Follow up done with [facility] liaison- Aggie Cosier to see if they can accept for HHPT- per Aggie Cosier they have confirmed [facility] branch can accept referral and service for HHPT needs.   TOC to continue to follow for any further needs.    Expected Discharge Plan: Home w Home Health Services Barriers to Discharge: Continued Medical Work up  Expected Discharge Plan and Services   Discharge Planning Services: CM Consult Post Acute Care Choice: Home Health Living arrangements for the past 2 months: Single Family Home                 DME Arranged: N/A DME Agency: NA       HH Arranged: PT HH Agency: [facility] Date HH Agency Contacted: 08/27/23 Time HH Agency Contacted: 1000 Representative spoke with at [facility] Agency: Aggie Cosier   Social Determinants of Health (SDOH) Interventions SDOH Screenings   Food Insecurity: No Food Insecurity (08/22/2023)  Housing: Low Risk  (08/22/2023)  Transportation Needs: No Transportation Needs (08/22/2023)  Utilities: Not At Risk (08/22/2023)  Depression (PHQ2-9): Low Risk  (12/14/2022)  Recent Concern: Depression (PHQ2-9) - High Risk (10/25/2022)  Social Connections: Moderately Isolated (10/25/2022)  Tobacco Use: Medium Risk (08/22/2023)    Readmission Risk Interventions    12/06/2022   10:13 AM 11/23/2022   10:35 AM 11/22/2022    4:12 PM  Readmission Risk Prevention Plan  Transportation Screening Complete Complete Complete  PCP or Specialist Appt within 5-7 Days  Complete Complete  Home Care Screening  Complete Complete  Medication Review (RN CM)  Complete Complete  HRI or Home Care  Consult Complete    Social Work Consult for Recovery Care Planning/Counseling Complete    Palliative Care Screening Not Applicable    Medication Review Oceanographer) Complete

## 2023-08-27 NOTE — Progress Notes (Signed)
 Mobility Specialist Progress Note:    08/27/23 1234  Mobility  Activity Ambulated with assistance in room  Level of Assistance Modified independent, requires aide device or extra time  Assistive Device None  Distance Ambulated (ft) 80 ft  Activity Response Tolerated well  Mobility Referral Yes  Mobility visit 1 Mobility  Mobility Specialist Start Time (ACUTE ONLY) 1225  Mobility Specialist Stop Time (ACUTE ONLY) 1234  Mobility Specialist Time Calculation (min) (ACUTE ONLY) 9 min   Pt received in bed, agreeable to ambulate in room. Pt refuses to wear O2 despite education and encouragement. Ambulated with ModI, furniture walking for support. SBA for safety. Tolerated well, c/o LLE and groin pain. Returned pt to bed, SpO2 85% on RA during ambualtion. Recovered, SpO2 93% on 1.5L. Left pt in bed with all needs met.    Feliciana Rossetti Mobility Specialist Please contact via Special educational needs teacher or  Rehab office at [phone]

## 2023-08-27 NOTE — Plan of Care (Signed)
   Problem: Education: Goal: Ability to describe self-care measures that may prevent or decrease complications (Diabetes Survival Skills Education) will improve Outcome: Progressing Goal: Individualized Educational Video(s) Outcome: Progressing

## 2023-08-28 ENCOUNTER — Encounter: Payer: Self-pay | Admitting: Hematology and Oncology

## 2023-08-28 ENCOUNTER — Other Ambulatory Visit (HOSPITAL_COMMUNITY): Payer: Self-pay

## 2023-08-28 LAB — GLUCOSE, CAPILLARY: Glucose-Capillary: 215 mg/dL — ABNORMAL HIGH (ref 70–99)

## 2023-08-28 MED ORDER — OXYCODONE HCL 5 MG PO TABS
5.0000 mg | ORAL_TABLET | Freq: Four times a day (QID) | ORAL | 0 refills | Status: DC | PRN
  Filled 2023-08-28: qty 20, 5d supply, fill #0

## 2023-08-28 NOTE — TOC Transition Note (Signed)
 Transition of Care (TOC) - Discharge Note Donn Pierini RN, BSN Transitions of Care Unit 4E- RN Case Manager See Treatment Team for direct phone #   Patient Details  Name: Vanessa [name] MRN: [id number] Date of Birth: 03-10-1961  Transition of Care [facility]) CM/SW Contact:  Darrold Span, RN Phone Number: 08/28/2023, 10:08 AM   Clinical Narrative:    Pt stable for transition home today, HHPT has been set up with [facility], liaison aware of discharge today and to follow up to schedule start of care.   Cm notified by bedside RN that pt needs home 02- however pt has stated that she is refusing home 02 on discharge. [facility] providers aware and no home 02 will be ordered.   Family to transport home, No further TOC needs noted.    Final next level of care: Home w Home Health Services Barriers to Discharge: Barriers Resolved   Patient Goals and CMS Choice Patient states their goals for this hospitalization and ongoing recovery are:: return home CMS Medicare.gov Compare Post Acute Care list provided to:: Patient Choice offered to / list presented to : Patient      Discharge Placement               Home w/ [facility]        Discharge Plan and Services Additional resources added to the After Visit Summary for     Discharge Planning Services: CM Consult Post Acute Care Choice: Home Health          DME Arranged: Oxygen, Patient refused services DME Agency: NA       HH Arranged: PT HH Agency: [facility] Date HH Agency Contacted: 08/27/23 Time HH Agency Contacted: 1000 Representative spoke with at [facility] Agency: Aggie Cosier  Social Drivers of Health (SDOH) Interventions SDOH Screenings   Food Insecurity: No Food Insecurity (08/22/2023)  Housing: Low Risk  (08/22/2023)  Transportation Needs: No Transportation Needs (08/22/2023)  Utilities: Not At Risk (08/22/2023)  Depression (PHQ2-9): Low Risk  (12/14/2022)  Recent Concern: Depression (PHQ2-9) - High Risk (10/25/2022)   Social Connections: Moderately Isolated (10/25/2022)  Tobacco Use: Medium Risk (08/22/2023)     Readmission Risk Interventions    08/28/2023   10:08 AM 12/06/2022   10:13 AM 11/23/2022   10:35 AM  Readmission Risk Prevention Plan  Post Dischage Appt Complete    Medication Screening Complete    Transportation Screening Complete Complete Complete  PCP or Specialist Appt within 5-7 Days   Complete  Home Care Screening   Complete  Medication Review (RN CM)   Complete  HRI or Home Care Consult  Complete   Social Work Consult for Recovery Care Planning/Counseling  Complete   Palliative Care Screening  Not Applicable   Medication Review Oceanographer)  Complete

## 2023-08-28 NOTE — Progress Notes (Signed)
  Progress Note    08/28/2023 7:49 AM 6 Days Post-Op  Subjective:  no complaints   Vitals:   08/27/23 2322 08/28/23 0316  BP: (!) 115/52 (!) 121/59  Pulse: 84 78  Resp: 20 20  Temp: 98.6 F (37 C) 98.6 F (37 C)  SpO2: 92% 92%   Physical Exam: Lungs:  non labored Incisions:  L groin and leg incisions well appearing Extremities:  brisk L PT by doppler Neurologic: A&O  CBC    Component Value Date/Time   WBC 14.1 (H) 08/23/2023 1331   RBC 4.24 08/23/2023 1331   HGB 12.2 08/23/2023 1331   HGB 17.0 (H) 09/21/2021 1543   HCT 38.5 08/23/2023 1331   HCT 49.4 (H) 09/21/2021 1543   PLT 318 08/23/2023 1331   PLT 464 (H) 09/21/2021 1543   MCV 90.8 08/23/2023 1331   MCV 88 09/21/2021 1543   MCH 28.8 08/23/2023 1331   MCHC 31.7 08/23/2023 1331   RDW 13.2 08/23/2023 1331   RDW 12.1 09/21/2021 1543   LYMPHSABS 3.2 12/06/2022 0052   LYMPHSABS 3.1 09/21/2021 1543   MONOABS 1.0 12/06/2022 0052   EOSABS 1.0 (H) 12/06/2022 0052   EOSABS 0.4 09/21/2021 1543   BASOSABS 0.1 12/06/2022 0052   BASOSABS 0.1 09/21/2021 1543    BMET    Component Value Date/Time   NA 134 (L) 08/23/2023 1331   NA 132 (L) 06/12/2023 1653   K 4.4 08/23/2023 1331   CL 98 08/23/2023 1331   CO2 24 08/23/2023 1331   GLUCOSE 175 (H) 08/23/2023 1331   BUN 16 08/23/2023 1331   BUN 10 06/12/2023 1653   CREATININE 0.74 08/23/2023 1331   CREATININE 0.56 11/04/2021 1019   CREATININE 0.45 (L) 12/03/2014 1542   CALCIUM 8.6 (L) 08/23/2023 1331   GFRNONAA >60 08/23/2023 1331   GFRNONAA >60 11/04/2021 1019   GFRNONAA >89 12/03/2014 1542   GFRAA 125 03/11/2020 1644   GFRAA >89 12/03/2014 1542    INR    Component Value Date/Time   INR 0.9 08/22/2023 0811     Intake/Output Summary (Last 24 hours) at 08/28/2023 0749 Last data filed at 08/27/2023 1143 Gross per 24 hour  Intake 240 ml  Output --  Net 240 ml     Assessment/Plan:  63 y.o. female is s/p  L femoral to AK pop bypass with iliac stenting   6  Days Post-Op   L foot well perfused by doppler exam Incisions are well appearing Continue plavix, aspirin, statin Patient will discharge today; She does not want HH PT or home O2 She will follow up in 2 weeks for incision check   [name], PA-C [facility] [phone] 08/28/2023 7:49 AM

## 2023-08-28 NOTE — Progress Notes (Signed)
 SATURATION QUALIFICATIONS: (This note is used to comply with regulatory documentation for home oxygen)  Patient Saturations on Room Air at Rest = 92%  Patient Saturations on Room Air while Ambulating = 86%  Patient Saturations on 2 Liters of oxygen while Ambulating = 94%  Please briefly explain why patient needs home oxygen: patient when ambulating drops her oxygen saturation

## 2023-08-28 NOTE — Progress Notes (Signed)
 Patient remains on 2 L of oxygen by nasal canula. When patient has removed her oxygen during sleep her O2 sat drops to 82% with good waveform. When O2 is replaced it quickly returns to 94%.

## 2023-08-28 NOTE — Progress Notes (Signed)
 Discharge instructions (including medications) discussed with and copy provided to patient/caregiver

## 2023-08-28 NOTE — Progress Notes (Signed)
 Patient refused to accept oxygen to go home with in spite of dropping her sats with walking.  Team made aware.  [name], PA made aware.

## 2023-08-30 NOTE — Discharge Summary (Signed)
 Bypass Discharge Summary Patient ID: Vanessa [name] [id number] 63 y.o. 10/30/1960  Admit date: 08/22/2023  Discharge date and time: 08/28/2023 10:06 AM   Admitting Physician: Nada Libman, MD   Discharge Physician: same  Admission Diagnoses: Critical lower limb ischemia [facility]) [I70.229]  Discharge Diagnoses: same  Admission Condition: fair  Discharged Condition: fair  Indication for Admission: post op bypass care  Hospital Course: Vanessa [name] is a 63 year old female who was brought in as an outpatient on 08/22/2023 and underwent left femoral to above-the-knee popliteal bypass with PTFE and left external iliac artery stenting by Dr. Myra Gianotti.  This was performed due to tissue loss at the level of the left ankle.  She tolerated the procedure well and was admitted to the hospital postoperatively.  Most of her hospital stay consisted of increasing her mobility as she was slow to mobilize due to postoperative pain.  Throughout the hospital stay she maintained brisk Doppler signals in the left lower extremity.  Left groin and above-the-knee popliteal incisions remained well-appearing.  Therapy recommended home health however patient refused.  Based on O2 saturation during activity she qualified for home oxygen however also refused.  She will follow-up in the office in 2 to 3 weeks for incision check.  She will continue her Plavix postoperatively.  She was prescribed 2 to 3 days of narcotic pain medication for continued postoperative pain control.  She was discharged home in stable condition.  Consults: None  Treatments: surgery: Left femoral to above-the-knee popliteal bypass with PTFE and left external iliac artery stenting by Dr. Myra Gianotti on 08/22/2023    Disposition: Discharge disposition: 01-Home or Self Care       - For [facility] Registry use ---  Post-op:  Wound infection: No  Graft infection: No  Transfusion: No   New Arrhythmia: No Patency judged by: [ x] Dopper only, [ ]   Palpable graft pulse, [ ]  Palpable distal pulse, [ ]  ABI inc. > 0.15, [ ]  Duplex D/C Ambulatory Status: Ambulatory  Complications: MI: [ x] No, [ ]  Troponin only, [ ]  EKG or Clinical CHF: No Resp failure: [ x] none, [ ]  Pneumonia, [ ]  Ventilator Chg in renal function: [ x] none, [ ]  Inc. Cr > 0.5, [ ]  Temp. Dialysis, [ ]  Permanent dialysis Stroke: [ x] None, [ ]  Minor, [ ]  Major Return to OR: No  Reason for return to OR: [ ]  Bleeding, [ ]  Infection, [ ]  Thrombosis, [ ]  Revision  Discharge medications: Statin use:  Yes ASA use:  Yes Plavix use:  Yes Beta blocker use: No  for medical reason not indicated Coumadin use: No  for medical reason not indicated    Patient Instructions:  Allergies as of 08/28/2023       Reactions   Jardiance [empagliflozin] Other (See Comments)   Severe yeast infection   Latex Rash   Avandia [rosiglitazone] Other (See Comments)   Headaches        Medication List     TAKE these medications    acetaminophen 500 MG tablet Commonly known as: TYLENOL Take 1,000 mg by mouth 2 (two) times daily as needed for mild pain or moderate pain.   albuterol 108 (90 Base) MCG/ACT inhaler Commonly known as: VENTOLIN HFA Inhale 1-2 puffs into the lungs every 6 (six) hours as needed for wheezing or shortness of breath.   aspirin EC 81 MG tablet Take 1 tablet (81 mg total) by mouth daily at 6 (six) AM. Swallow whole.  atorvastatin 80 MG tablet Commonly known as: LIPITOR Take 1 tablet (80 mg total) by mouth daily. What changed: how much to take   buPROPion 150 MG 12 hr tablet Commonly known as: WELLBUTRIN SR Take 1 tablet (150 mg total) by mouth 2 (two) times daily. What changed:  when to take this reasons to take this   cetirizine 10 MG tablet Commonly known as: ZYRTEC Take 10 mg by mouth daily as needed for allergies.   cimetidine 200 MG tablet Commonly known as: TAGAMET Take 200 mg by mouth 2 (two) times daily.   citalopram 40 MG  tablet Commonly known as: CELEXA Take 1 tablet (40 mg total) by mouth daily.   clopidogrel 75 MG tablet Commonly known as: PLAVIX Take 1 tablet (75 mg total) by mouth daily.   ezetimibe 10 MG tablet Commonly known as: ZETIA Take 1 tablet (10 mg total) by mouth daily.   gabapentin 300 MG capsule Commonly known as: NEURONTIN Take 1 capsule (300 mg total) by mouth at bedtime.   glucose blood test strip Use as instructed   ibuprofen 200 MG tablet Commonly known as: ADVIL Take 600 mg by mouth every 6 (six) hours as needed for mild pain (pain score 1-3) or moderate pain (pain score 4-6).   lisinopril 5 MG tablet Commonly known as: ZESTRIL Take 1 tablet (5 mg total) by mouth daily.   nicotine 21 mg/24hr patch Commonly known as: NICODERM CQ - dosed in mg/24 hours Place 1 patch (21 mg total) onto the skin daily as needed (tobacco cravings).   oxyCODONE 5 MG immediate release tablet Commonly known as: Oxy IR/ROXICODONE Take 1 tablet (5 mg total) by mouth every 6 (six) hours as needed for moderate pain (pain score 4-6).   Pen Needles 31G X 5 MM Misc 1 each by Does not apply route daily. Use to inject insulin   Soliqua 100-33 UNT-MCG/ML Sopn Generic drug: Insulin Glargine-Lixisenatide Inject 10 Units into the skin daily.   tiotropium 18 MCG inhalation capsule Commonly known as: SPIRIVA Place 18 mcg into inhaler and inhale daily.   Trelegy Ellipta 200-62.5-25 MCG/ACT Aepb Generic drug: Fluticasone-Umeclidin-Vilant Inhale 1 puff into the lungs daily.       Activity: activity as tolerated Diet: regular diet Wound Care: keep wound clean and dry  Follow-up with [facility] in 3 weeks.  SignedEmilie Rutter 08/30/2023 12:40 PM

## 2023-09-17 ENCOUNTER — Encounter

## 2023-09-24 ENCOUNTER — Other Ambulatory Visit: Payer: Self-pay

## 2023-09-24 ENCOUNTER — Ambulatory Visit (INDEPENDENT_AMBULATORY_CARE_PROVIDER_SITE_OTHER): Admitting: Physician Assistant

## 2023-09-24 VITALS — BP 157/84 | HR 90 | Temp 98.1°F | Ht 62.0 in | Wt 151.5 lb

## 2023-09-24 DIAGNOSIS — I70243 Atherosclerosis of native arteries of left leg with ulceration of ankle: Secondary | ICD-10-CM

## 2023-09-24 DIAGNOSIS — I70213 Atherosclerosis of native arteries of extremities with intermittent claudication, bilateral legs: Secondary | ICD-10-CM

## 2023-09-24 NOTE — Progress Notes (Unsigned)
 POST OPERATIVE OFFICE NOTE    CC:  F/u for surgery  HPI:  Vanessa [name] is a 63 y.o. female who presents today for incision check.  She recently underwent left femoral to above-knee popliteal artery bypass with PTFE and left external iliac artery stenting on 08/22/2023 by Dr. Myra Gianotti.  This was done for left foot wounds with left SFA occlusion and distal external iliac artery stenosis.  Prior to this she underwent left common iliac artery stenting on 08/07/2023.  She has also required right axillary to common femoral artery bypass with PTFE on 11/29/2022 by Dr. Myra Gianotti.    Pt returns today for follow up.  Pt states she has been doing pretty good since surgery.  She denies any issues with her left groin incision such as dehiscence, drainage, or swelling.  She also says that her left above-knee incision is mostly healing with a small area of suture spitting out.  She says that she has tried to trim this back but it is still present.  She also endorses a little bit of swelling around her left above-knee incision without skin discoloration.  She denies any rest pain.  She has been keeping her left foot wounds clean and dry.  She has not been putting bandages on her wounds.   Allergies  Allergen Reactions   Jardiance [Empagliflozin] Other (See Comments)    Severe yeast infection   Latex Rash   Avandia [Rosiglitazone] Other (See Comments)    Headaches     Current Outpatient Medications  Medication Sig Dispense Refill   acetaminophen (TYLENOL) 500 MG tablet Take 1,000 mg by mouth 2 (two) times daily as needed for mild pain or moderate pain.     albuterol (VENTOLIN HFA) 108 (90 Base) MCG/ACT inhaler Inhale 1-2 puffs into the lungs every 6 (six) hours as needed for wheezing or shortness of breath. 9 g 3   aspirin EC 81 MG tablet Take 1 tablet (81 mg total) by mouth daily at 6 (six) AM. Swallow whole. 30 tablet 12   atorvastatin (LIPITOR) 80 MG tablet Take 1 tablet (80 mg total) by mouth daily. (Patient  taking differently: Take 40 mg by mouth daily.) 90 tablet 2   cetirizine (ZYRTEC) 10 MG tablet Take 10 mg by mouth daily as needed for allergies.     cimetidine (TAGAMET) 200 MG tablet Take 200 mg by mouth 2 (two) times daily.     citalopram (CELEXA) 40 MG tablet Take 1 tablet (40 mg total) by mouth daily. 90 tablet 3   clopidogrel (PLAVIX) 75 MG tablet Take 1 tablet (75 mg total) by mouth daily. 30 tablet 6   gabapentin (NEURONTIN) 300 MG capsule Take 1 capsule (300 mg total) by mouth at bedtime. 30 capsule 2   glucose blood test strip Use as instructed 100 each 12   ibuprofen (ADVIL) 200 MG tablet Take 600 mg by mouth every 6 (six) hours as needed for mild pain (pain score 1-3) or moderate pain (pain score 4-6).     Insulin Glargine-Lixisenatide (SOLIQUA) 100-33 UNT-MCG/ML SOPN Inject 10 Units into the skin daily. (Patient taking differently: Inject 30 Units into the skin daily.) 3 mL 11   Insulin Pen Needle (PEN NEEDLES) 31G X 5 MM MISC 1 each by Does not apply route daily. Use to inject insulin 100 each 0   lisinopril (ZESTRIL) 5 MG tablet Take 1 tablet (5 mg total) by mouth daily. 30 tablet 0   tiotropium (SPIRIVA) 18 MCG inhalation capsule Place 18 mcg  into inhaler and inhale daily.     buPROPion (WELLBUTRIN SR) 150 MG 12 hr tablet Take 1 tablet (150 mg total) by mouth 2 (two) times daily. (Patient not taking: Reported on 09/24/2023) 60 tablet 2   ezetimibe (ZETIA) 10 MG tablet Take 1 tablet (10 mg total) by mouth daily. (Patient not taking: Reported on 09/24/2023) 30 tablet 3   Fluticasone-Umeclidin-Vilant (TRELEGY ELLIPTA) 200-62.5-25 MCG/ACT AEPB Inhale 1 puff into the lungs daily. (Patient not taking: Reported on 09/24/2023) 60 each 9   nicotine (NICODERM CQ - DOSED IN MG/24 HOURS) 21 mg/24hr patch Place 1 patch (21 mg total) onto the skin daily as needed (tobacco cravings). (Patient not taking: Reported on 09/24/2023) 28 patch 0   oxyCODONE (OXY IR/ROXICODONE) 5 MG immediate release tablet  Take 1 tablet (5 mg total) by mouth every 6 (six) hours as needed for moderate pain (pain score 4-6). (Patient not taking: Reported on 09/24/2023) 20 tablet 0   No current facility-administered medications for this visit.     ROS:  See HPI  Physical Exam:  Incision: Left groin and left above-knee incisions almost completely healed without signs of infection.  Mild amount of swelling around above-knee incision, likely due to hematoma.  Small area of spitting suture at distal portion of above-knee incision, I have tried to trim this back Extremities: Left lower extremity well-perfused with brisk PT Doppler signal.  Healthy appearing left medial ankle and heel ulcerations Neuro: Intact motor and sensation of left lower extremity         Assessment/Plan:  This is a 63 y.o. female who is here for postop check  -The patient recently underwent left femoral to above-knee popliteal artery bypass with iliac stenting for left foot wounds -Her left groin and left above-knee incisions are healing appropriately without signs of infection.  There is a small amount of swelling around her above-knee incision, likely due to hematoma.  The distal portion of her above-knee incision also has some spitting suture, which I tried to trim back -Her left lower extremity is edematous with some dependent rubor.  She has been elevating her legs minimally -Her left lower extremity is warm and well-perfused with a brisk PT Doppler signal.  Her left foot wounds also appear to be healing. -I have recommended that she elevate her left leg above her heart several times a day for at least 10 to 15 minutes to help with swelling.  I have also recommended that she start putting clean bandages on her wounds.  She will try to put on a clean wet to dry dressing to her medial ankle ulceration daily. -She can follow-up with our office in 3 to 4 weeks for repeat wound check with left aortoiliac duplex, LLE bypass graft duplex, and  ABIs   Loel Dubonnet, PA-C [facility] [phone]   Clinic MD:  Myra Gianotti

## 2023-09-26 ENCOUNTER — Other Ambulatory Visit: Payer: Self-pay

## 2023-09-26 DIAGNOSIS — I70213 Atherosclerosis of native arteries of extremities with intermittent claudication, bilateral legs: Secondary | ICD-10-CM

## 2023-10-04 ENCOUNTER — Other Ambulatory Visit: Payer: Self-pay | Admitting: Internal Medicine

## 2023-10-08 ENCOUNTER — Ambulatory Visit

## 2023-10-08 ENCOUNTER — Encounter (HOSPITAL_COMMUNITY)

## 2023-10-08 ENCOUNTER — Other Ambulatory Visit (HOSPITAL_COMMUNITY)

## 2023-10-16 ENCOUNTER — Other Ambulatory Visit: Payer: Self-pay | Admitting: Student

## 2023-10-16 DIAGNOSIS — J449 Chronic obstructive pulmonary disease, unspecified: Secondary | ICD-10-CM

## 2023-10-16 NOTE — Telephone Encounter (Signed)
 Copied from CRM [id number]. Topic: Clinical - Medication Refill >> Oct 16, 2023  3:03 PM Hamdi H wrote: Most Recent Primary Care Visit:  Provider: GOODWIN, JOSEPH  Department: [facility]  Visit Type: OPEN ESTABLISHED  Date: 06/12/2023  Medication: albuterol  (VENTOLIN  HFA) 108 (90 Base) MCG/ACT inhaler  Has the patient contacted their pharmacy? Yes (Agent: If no, request that the patient contact the pharmacy for the refill. If patient does not wish to contact the pharmacy document the reason why and proceed with request.) (Agent: If yes, when and what did the pharmacy advise?) No refills left  Is this the correct pharmacy for this prescription? Yes If no, delete pharmacy and type the correct one.  This is the patient's preferred pharmacy:  [id number] - [place] ([place]), [place] - [address] [place] ([place]) [place] [address] Phone: [phone] Fax: [phone]   Has the prescription been filled recently? No  Is the patient out of the medication? No  Has the patient been seen for an appointment in the last year OR does the patient have an upcoming appointment? Yes  Can we respond through MyChart? No  Agent: Please be advised that Rx refills may take up to 3 business days. We ask that you follow-up with your pharmacy.

## 2023-10-19 ENCOUNTER — Telehealth: Payer: Self-pay

## 2023-10-19 ENCOUNTER — Other Ambulatory Visit (HOSPITAL_COMMUNITY): Payer: Self-pay

## 2023-10-19 DIAGNOSIS — J449 Chronic obstructive pulmonary disease, unspecified: Secondary | ICD-10-CM

## 2023-10-19 MED ORDER — ALBUTEROL SULFATE HFA 108 (90 BASE) MCG/ACT IN AERS: 1.0000 | INHALATION_SPRAY | Freq: Four times a day (QID) | RESPIRATORY_TRACT | 3 refills | Status: DC | PRN

## 2023-10-19 NOTE — Telephone Encounter (Signed)
 Rec'd PA request for patients albuterol  inhaler.   Insurance covers brand Ventolin  inhaler. Please resend for Ventolin  18 gram inhaler if applicable. Thanks!

## 2023-10-22 ENCOUNTER — Telehealth: Payer: Self-pay

## 2023-10-22 NOTE — Telephone Encounter (Signed)
 ERROR

## 2023-10-23 MED ORDER — ALBUTEROL SULFATE HFA 108 (90 BASE) MCG/ACT IN AERS
1.0000 | INHALATION_SPRAY | Freq: Four times a day (QID) | RESPIRATORY_TRACT | 3 refills | Status: DC | PRN
Start: 2023-10-23 — End: 2023-11-26

## 2023-10-25 ENCOUNTER — Ambulatory Visit (HOSPITAL_COMMUNITY): Admission: RE | Admit: 2023-10-25 | Source: Ambulatory Visit

## 2023-10-25 ENCOUNTER — Ambulatory Visit (HOSPITAL_COMMUNITY): Attending: Vascular Surgery

## 2023-10-25 ENCOUNTER — Ambulatory Visit (HOSPITAL_COMMUNITY): Admission: RE | Admit: 2023-10-25 | Source: Ambulatory Visit | Attending: Surgery | Admitting: Surgery

## 2023-11-26 ENCOUNTER — Telehealth: Payer: Self-pay

## 2023-11-26 ENCOUNTER — Other Ambulatory Visit: Payer: Self-pay | Admitting: Student

## 2023-11-26 ENCOUNTER — Encounter: Payer: Self-pay | Admitting: Hematology and Oncology

## 2023-11-26 DIAGNOSIS — J449 Chronic obstructive pulmonary disease, unspecified: Secondary | ICD-10-CM

## 2023-11-26 NOTE — Telephone Encounter (Signed)
 Call to Pharmacy patient has available refills. Pharmacy states patient needs to call in with her Insurance information. Call to patient given message to call the pharmacy.  Stated that Pharmacy sad that the prescription was not there .

## 2023-11-26 NOTE — Telephone Encounter (Unsigned)
 Copied from CRM [id number]. Topic: Clinical - Medication Refill >> Nov 26, 2023  8:16 AM Shamecia H wrote: Medication: albuterol  (VENTOLIN  HFA) 108 (90 Base) MCG/ACT inhaler,   Has the patient contacted their pharmacy? Yes (Agent: If no, request that the patient contact the pharmacy for the refill. If patient does not wish to contact the pharmacy document the reason why and proceed with request.) (Agent: If yes, when and what did the pharmacy advise?)  This is the patient's preferred pharmacy:  [facility] - [place] ([place]), [place] - [address]  ([place])  [address] Phone: [phone] Fax: [phone]  Is this the correct pharmacy for this prescription? Yes If no, delete pharmacy and type the correct one.   Has the prescription been filled recently? No  Is the patient out of the medication? Yes  Has the patient been seen for an appointment in the last year OR does the patient have an upcoming appointment? Yes  Can we respond through MyChart? Yes  Agent: Please be advised that Rx refills may take up to 3 business days. We ask that you follow-up with your pharmacy.

## 2023-11-26 NOTE — Telephone Encounter (Signed)
 Patient asking for update on med refill of Albuterol . Please advise.  Copied from CRM [id number]. Topic: Clinical - Medication Refill >> Nov 26, 2023  4:07 PM Adrianna P wrote: Patient called for an update, she is currently out of this medication

## 2023-11-28 MED ORDER — ALBUTEROL SULFATE HFA 108 (90 BASE) MCG/ACT IN AERS: 1.0000 | INHALATION_SPRAY | Freq: Four times a day (QID) | RESPIRATORY_TRACT | 3 refills | Status: DC | PRN

## 2023-12-06 ENCOUNTER — Other Ambulatory Visit: Payer: Self-pay | Admitting: *Deleted

## 2023-12-06 ENCOUNTER — Encounter: Payer: Self-pay | Admitting: Hematology and Oncology

## 2023-12-06 DIAGNOSIS — I70243 Atherosclerosis of native arteries of left leg with ulceration of ankle: Secondary | ICD-10-CM

## 2023-12-13 ENCOUNTER — Other Ambulatory Visit (HOSPITAL_COMMUNITY): Payer: Self-pay

## 2023-12-13 ENCOUNTER — Encounter: Payer: Self-pay | Admitting: Hematology and Oncology

## 2023-12-13 ENCOUNTER — Other Ambulatory Visit: Payer: Self-pay

## 2023-12-13 ENCOUNTER — Ambulatory Visit (INDEPENDENT_AMBULATORY_CARE_PROVIDER_SITE_OTHER): Payer: Self-pay | Admitting: Student

## 2023-12-13 VITALS — BP 109/45 | HR 101 | Temp 98.3°F | Ht 62.0 in | Wt 156.2 lb

## 2023-12-13 DIAGNOSIS — I739 Peripheral vascular disease, unspecified: Secondary | ICD-10-CM

## 2023-12-13 DIAGNOSIS — Z5971 Insufficient health insurance coverage: Secondary | ICD-10-CM

## 2023-12-13 DIAGNOSIS — F32A Depression, unspecified: Secondary | ICD-10-CM

## 2023-12-13 DIAGNOSIS — F419 Anxiety disorder, unspecified: Secondary | ICD-10-CM

## 2023-12-13 DIAGNOSIS — E119 Type 2 diabetes mellitus without complications: Secondary | ICD-10-CM

## 2023-12-13 DIAGNOSIS — F17211 Nicotine dependence, cigarettes, in remission: Secondary | ICD-10-CM

## 2023-12-13 DIAGNOSIS — F17200 Nicotine dependence, unspecified, uncomplicated: Secondary | ICD-10-CM

## 2023-12-13 DIAGNOSIS — Z9889 Other specified postprocedural states: Secondary | ICD-10-CM

## 2023-12-13 DIAGNOSIS — Z794 Long term (current) use of insulin: Secondary | ICD-10-CM

## 2023-12-13 DIAGNOSIS — J449 Chronic obstructive pulmonary disease, unspecified: Secondary | ICD-10-CM

## 2023-12-13 DIAGNOSIS — I10 Essential (primary) hypertension: Secondary | ICD-10-CM

## 2023-12-13 DIAGNOSIS — E785 Hyperlipidemia, unspecified: Secondary | ICD-10-CM

## 2023-12-13 LAB — POCT GLYCOSYLATED HEMOGLOBIN (HGB A1C): HbA1c POC (<> result, manual entry): >14 % — AB (ref 4.0–5.6)

## 2023-12-13 LAB — GLUCOSE, CAPILLARY: Glucose-Capillary: 435 mg/dL — ABNORMAL HIGH (ref 70–99)

## 2023-12-13 MED ORDER — EZETIMIBE 10 MG PO TABS
10.0000 mg | ORAL_TABLET | Freq: Every day | ORAL | 3 refills | Status: AC
  Filled 2023-12-13: qty 30, 30d supply, fill #0
  Filled 2024-03-31: qty 27, 27d supply, fill #1

## 2023-12-13 MED ORDER — CITALOPRAM HYDROBROMIDE 40 MG PO TABS
40.0000 mg | ORAL_TABLET | Freq: Every day | ORAL | 3 refills | Status: DC
  Filled 2023-12-13: qty 90, 90d supply, fill #0

## 2023-12-13 MED ORDER — LISINOPRIL 5 MG PO TABS
5.0000 mg | ORAL_TABLET | Freq: Every day | ORAL | 0 refills | Status: DC
  Filled 2023-12-13: qty 30, 30d supply, fill #0

## 2023-12-13 MED ORDER — TRELEGY ELLIPTA 200-62.5-25 MCG/ACT IN AEPB
1.0000 | INHALATION_SPRAY | Freq: Every day | RESPIRATORY_TRACT | 9 refills | Status: DC
  Filled 2023-12-13: qty 60, 30d supply, fill #0

## 2023-12-13 MED ORDER — BASAGLAR KWIKPEN 100 UNIT/ML ~~LOC~~ SOPN
30.0000 [IU] | PEN_INJECTOR | Freq: Every day | SUBCUTANEOUS | 6 refills | Status: DC
  Filled 2023-12-13: qty 9, 30d supply, fill #0

## 2023-12-13 MED ORDER — CLOPIDOGREL BISULFATE 75 MG PO TABS
75.0000 mg | ORAL_TABLET | Freq: Every day | ORAL | 6 refills | Status: DC
  Filled 2023-12-13: qty 30, 30d supply, fill #0
  Filled 2024-02-23: qty 30, 30d supply, fill #1
  Filled 2024-03-31: qty 30, 30d supply, fill #2
  Filled 2024-05-12: qty 30, 30d supply, fill #3

## 2023-12-13 MED ORDER — BUPROPION HCL ER (SR) 150 MG PO TB12
150.0000 mg | ORAL_TABLET | Freq: Two times a day (BID) | ORAL | 0 refills | Status: DC
  Filled 2023-12-13: qty 60, 30d supply, fill #0

## 2023-12-13 MED ORDER — ATORVASTATIN CALCIUM 80 MG PO TABS
80.0000 mg | ORAL_TABLET | Freq: Every day | ORAL | 2 refills | Status: AC
  Filled 2023-12-13: qty 90, 90d supply, fill #0

## 2023-12-13 MED ORDER — GABAPENTIN 300 MG PO CAPS
300.0000 mg | ORAL_CAPSULE | Freq: Every day | ORAL | 2 refills | Status: DC
  Filled 2023-12-13: qty 30, 30d supply, fill #0
  Filled 2024-03-31: qty 30, 30d supply, fill #1

## 2023-12-13 NOTE — Assessment & Plan Note (Signed)
 Overall doing well although she is not on triple therapy as designed.  She is only taking Spiriva  daily and albuterol  inhaler as needed due to cost.  Unfortunately there are no inhalers on the [facility] list so we will start a prior authorization for Trelegy and then Breztri if needed before moving onto multiple inhalers. - Continue albuterol  inhaler as needed and Spiriva  18 mcg daily - Start prior authorization for triple therapy

## 2023-12-13 NOTE — Assessment & Plan Note (Signed)
 Lipid panel 06/12/2023 showed triglycerides of 1200, attempted to have her return for repeat levels unsuccessfully but she did have a repeat on 08/23/2023 with triglycerides down to 329 and LDL 90. - Continue atorvastatin  80 mg daily and ezetimibe  10 mg daily

## 2023-12-13 NOTE — Assessment & Plan Note (Signed)
 Due to some changes in her household income after retirement benefits she has lost Medicaid coverage.  She is not a [facility] patient so we will have our internal office help find insurance options.  Fortunately a lot of her medications are on the [facility] list so we have changed her pharmacy to [facility] pharmacy for this reason.

## 2023-12-13 NOTE — Assessment & Plan Note (Signed)
 Blood pressure slightly elevated at 157/71 on first check and then 109/45 on second check.  She is almost out of lisinopril  5 mg daily so we will send a refill in. - Continue lisinopril  5 mg daily and repeat BMP today

## 2023-12-13 NOTE — Patient Instructions (Addendum)
 Thank you, [name], for allowing us  to provide your care today. Today we discussed . . .  > Diabetes       - Start taking insulin  glargine (Basaglar ) 30 units daily and we will have you follow-up in 2 weeks to see how this is going and hopefully meet with diabetes coordinator Abe Abed. > Medications       - I have sent in refills for your medications that should be free at the pharmacy here at [facility].  It is based on availability of the medicine so if there are any medicines that they do not have please come back to our office and we can figure out which ones are best to replace.  For your inhaler I will talk to our pharmacist about getting a prior Auth started for Trelegy or Breztri.   I have ordered the following labs for you:   Lab Orders         Glucose, capillary         Microalbumin / Creatinine Urine Ratio         POC Hbg A1C       Follow up: 2-3 weeks    Remember:  Should you have any questions or concerns please call the internal medicine clinic at [phone].     Cleven Dallas, DO [facility]

## 2023-12-13 NOTE — Assessment & Plan Note (Signed)
 Unfortunately patient ran out of her insulin /GLP-1 medication over a month ago and has not had any diabetes medication since then.  Her A1c today is above 14.  She does not have any signs or symptoms of DKA.  She does have some polyuria.  Discussed the importance of getting back on insulin  and better glucose control. - Start Basaglar  30 units daily and follow-up in 2 weeks for reevaluation and appointment with diabetes coordinator

## 2023-12-13 NOTE — Assessment & Plan Note (Signed)
 Unfortunately she had run out of her citalopram  40 mg daily but was doing well on this prior.  PHQ-9/GAD-7 scores are elevated in the setting so we will restart her citalopram . - Citalopram  40 mg daily

## 2023-12-13 NOTE — Assessment & Plan Note (Signed)
 Continues to do well abstaining from cigarettes however she has ran out of her Wellbutrin  so we will refill this today. - Resume Wellbutrin  150 mg twice daily

## 2023-12-13 NOTE — Progress Notes (Signed)
 CC: Routine Follow Up for hypertension and diabetes after last office visit 06/12/2023  HPI:  Vanessa [name] is a 63 y.o. female with pertinent PMH of T2DM on insulin , HTN, severe PAD s/p right axillary femoral bypass, COPD  who presents as above. Please see assessment and plan below for further details.  Medications: Current Outpatient Medications  Medication Instructions   acetaminophen  (TYLENOL ) 1,000 mg, 2 times daily PRN   albuterol  (VENTOLIN  HFA) 108 (90 Base) MCG/ACT inhaler 1-2 puffs, Inhalation, Every 6 hours PRN   aspirin  EC 81 mg, Oral, Daily, Swallow whole.   atorvastatin  (LIPITOR) 80 mg, Oral, Daily   Basaglar  KwikPen 30 Units, Subcutaneous, Daily   buPROPion  (WELLBUTRIN  SR) 150 mg, Oral, 2 times daily   cetirizine  (ZYRTEC ) 10 mg, Daily PRN   cimetidine  (TAGAMET ) 200 mg, 2 times daily   citalopram  (CELEXA ) 40 mg, Oral, Daily   clopidogrel  (PLAVIX ) 75 mg, Oral, Daily   ezetimibe  (ZETIA ) 10 mg, Oral, Daily   Fluticasone -Umeclidin-Vilant (TRELEGY ELLIPTA ) 200-62.5-25 MCG/ACT AEPB 1 puff, Inhalation, Daily   gabapentin  (NEURONTIN ) 300 mg, Oral, Daily at bedtime   glucose blood test strip Use as instructed   ibuprofen  (ADVIL ) 600 mg, Every 6 hours PRN   Insulin  Pen Needle (PEN NEEDLES) 31G X 5 MM MISC 1 each, Does not apply, Daily, Use to inject insulin    lisinopril  (ZESTRIL ) 5 mg, Oral, Daily   tiotropium (SPIRIVA ) 18 mcg, Daily     Review of Systems:   Pertinent items noted in HPI and/or A&P.  Physical Exam:  Vitals:   12/13/23 1341 12/13/23 1413  BP: (!) 157/71 (!) 109/45  Pulse: 88 (!) 101  Temp: 98.3 F (36.8 C)   TempSrc: Oral   SpO2: 100%   Weight: 156 lb 3.2 oz (70.9 kg)   Height: 5' 2 (1.575 m)     Constitutional: Tired appearing elderly female. In no acute distress. HEENT: Normocephalic, atraumatic, Sclera non-icteric, PERRL, EOM intact Cardio:Regular rate and rhythm. 2+ bilateral radial pulses.  Nonpalpable bilateral dorsalis pedis  pulses Pulm:Clear to auscultation bilaterally. Normal work of breathing on room air. Abdomen: Soft, non-tender, non-distended, positive bowel sounds.  Fluid-filled tract along the right flank corresponding to her axillary femoral bypass graft without overlying skin changes MSK: Left greater than right pitting edema in the lower extremities without overlying erythema or increased warmth.  Legs are slightly cool from the knee down bilaterally without signs of acute limb ischemia.  Fluctuant 10 x 5 cm area in the superior medial left knee underlying well-healed surgical scar corresponding to popliteal bypass.  No active wounds or ulcers noted. Neuro:Alert and oriented x3. No focal deficit noted. Psych:Pleasant mood and affect.   Assessment & Plan:   Essential hypertension, benign Blood pressure slightly elevated at 157/71 on first check and then 109/45 on second check.  She is almost out of lisinopril  5 mg daily so we will send a refill in. - Continue lisinopril  5 mg daily and repeat BMP today  COPD (chronic obstructive pulmonary disease) (HCC) Overall doing well although she is not on triple therapy as designed.  She is only taking Spiriva  daily and albuterol  inhaler as needed due to cost.  Unfortunately there are no inhalers on the [facility] list so we will start a prior authorization for Trelegy and then Breztri if needed before moving onto multiple inhalers. - Continue albuterol  inhaler as needed and Spiriva  18 mcg daily - Start prior authorization for triple therapy  Tobacco dependence Continues to do well  abstaining from cigarettes however she has ran out of her Wellbutrin  so we will refill this today. - Resume Wellbutrin  150 mg twice daily  Diabetes mellitus, type II, insulin  dependent (HCC) Unfortunately patient ran out of her insulin /GLP-1 medication over a month ago and has not had any diabetes medication since then.  Her A1c today is above 14.  She does not have any signs or symptoms of  DKA.  She does have some polyuria.  Discussed the importance of getting back on insulin  and better glucose control. - Start Basaglar  30 units daily and follow-up in 2 weeks for reevaluation and appointment with diabetes coordinator  Anxiety and depression Unfortunately she had run out of her citalopram  40 mg daily but was doing well on this prior.  PHQ-9/GAD-7 scores are elevated in the setting so we will restart her citalopram . - Citalopram  40 mg daily  Insurance coverage problems Due to some changes in her household income after retirement benefits she has lost Medicaid coverage.  She is not a [facility] patient so we will have our internal office help find insurance options.  Fortunately a lot of her medications are on the [facility] list so we have changed her pharmacy to [facility] pharmacy for this reason.  HLD (hyperlipidemia) Lipid panel 06/12/2023 showed triglycerides of 1200, attempted to have her return for repeat levels unsuccessfully but she did have a repeat on 08/23/2023 with triglycerides down to 329 and LDL 90. - Continue atorvastatin  80 mg daily and ezetimibe  10 mg daily    Patient discussed with Dr. Driscilla George  Cleven Dallas, DO [facility] Internal Medicine Resident PGY-2 Clinic Phone: [phone] Please contact the on call pager at [phone] for any urgent or emergent needs.

## 2023-12-14 LAB — BMP8+ANION GAP
Anion Gap: 18 mmol/L (ref 10.0–18.0)
BUN/Creatinine Ratio: 28 (ref 12–28)
BUN: 17 mg/dL (ref 8–27)
CO2: 18 mmol/L — ABNORMAL LOW (ref 20–29)
Calcium: 9.5 mg/dL (ref 8.7–10.3)
Chloride: 92 mmol/L — ABNORMAL LOW (ref 96–106)
Creatinine, Ser: 0.6 mg/dL (ref 0.57–1.00)
Glucose: 485 mg/dL — ABNORMAL HIGH (ref 70–99)
Potassium: 4.8 mmol/L (ref 3.5–5.2)
Sodium: 128 mmol/L — ABNORMAL LOW (ref 134–144)
eGFR: 101 mL/min/{1.73_m2} (ref >59)

## 2023-12-14 LAB — MICROALBUMIN / CREATININE URINE RATIO
Creatinine, Urine: 42.8 mg/dL
Microalb/Creat Ratio: 1303 mg/g{creat} — ABNORMAL HIGH (ref 0–29)
Microalbumin, Urine: 557.6 ug/mL

## 2023-12-19 ENCOUNTER — Ambulatory Visit: Payer: Self-pay | Admitting: Student

## 2023-12-19 NOTE — Progress Notes (Signed)
 Internal Medicine Clinic Attending  Case discussed with the resident at the time of the visit.  We reviewed the resident's history and exam and pertinent patient test results.  I agree with the assessment, diagnosis, and plan of care documented in the resident's note.   Lots going on today. I agree that restarting insulin  is the top priority. At upcoming visit, please adjust diabetes medicines and also consider increasing ACE or adding SGLT2 for significant proteinuria.

## 2023-12-19 NOTE — Progress Notes (Signed)
 Lab results show stable urine microalbumin to creatinine ratio and BMP shows elevated blood sugar and low sodium that corrects to within normal limits.  Continue with current plan and follow-up in the [facility] on 12/27/2023.

## 2023-12-21 NOTE — Progress Notes (Unsigned)
 HISTORY AND PHYSICAL     CC:  follow up. Requesting Provider:  Jolaine Pac, DO  HPI: This is a 63 y.o. female who is here today for follow up for PAD.  Pt has hx of right axillary to CFA with 8mm ringed PTFE on 11/29/2022 by Dr. Serene for right foot ulcer.  She underwent angiogram on 08/07/2023 and had stent of the left CIA and left EIA by Dr. Serene for left foot ulcer and subsequently underwent left femoral to AK popliteal artery bypass graft with PTFE and left EIA stent on 08/22/2023 by Dr. Serene.  Pt was last seen 09/24/2023 and at that time, and she was doing well.  She was having a little bit of swelling at the AK incision.  She was not having any rest pain and her ulcer was clean and dry.  She had brisk doppler flow in the left PT.    The pt returns today for follow up.  She states that the wounds on her left foot have completely healed.  She states she does have some swelling around the incision above the knee.  She denies any fever, chills or redness around the incision.   She states that it does get smaller after being in bed overnight but enlarges after she is on her feet for a while. She denies any pain in her feet.   The pt is on a statin for cholesterol management.    The pt is on an aspirin .    Other AC:  Plavix  The pt is on ACEI for hypertension.  The pt is  on diabetic medication. Tobacco hx:  former  PHYSICAL EXAMINATION:  Today's Vitals   12/24/23 1000  BP: (!) 168/72  Pulse: 83  Temp: 97.9 F (36.6 C)  TempSrc: Temporal  SpO2: 91%  Weight: 153 lb 11.2 oz (69.7 kg)  Height: 5' 2 (1.575 m)  PainSc: 8   PainLoc: Leg   Body mass index is 28.11 kg/m.   General:  WDWN in NAD; vital signs documented above Pulmonary: normal non-labored breathing , without wheezing Incisions: left groin and left above knee incisions have completely healed. The distal incision with seroma that is soft.  No erythema present.  No drainage.    Vascular Exam/Pulses: Palpable  left DP pulse; brisk doppler flow right DP/PT/pero and left PT/pero; right ax-fem bypass with palpable pulse.  Extremities: wounds left foot completely healed.     Non-Invasive Vascular Imaging:   ABI's/TBI's on 12/24/2022: Right:  0.71/0.46 - Great toe pressure: 71 Left:  0.95/0.50 - Great toe pressure: 78  Arterial duplex on 12/24/2022: Abdominal Aorta Findings:  +-------------+-------+----------+----------+--------+--------+------------  Location    AP (cm)Trans (cm)PSV (cm/s)WaveformThrombusComments     +-------------+-------+----------+----------+--------+--------+------------  Proximal                     87                        NWV          +-------------+-------+----------+----------+--------+--------+------------  Mid                                                    NV            +-------------+-------+----------+----------+--------+--------+------------   Distal  NV           +-------------+-------+----------+----------+--------+--------+------------  LT EIA Prox                   201                                    +-------------+-------+----------+----------+--------+--------+------------  LT EIA Distal                 289                       Broad, biphasic  +-------------+-------+----------+----------+--------+--------+------------   Left Stent(s):  +---------------+--------+---------------+--------+------------+  CIA stent      PSV cm/sStenosis       WaveformComments      +---------------+--------+---------------+--------+------------+  Prox to Stent  240                    biphasic              +---------------+--------+---------------+--------+------------+  Proximal Stent 269     50-99% stenosisbiphasicslight curve  +---------------+--------+---------------+--------+------------+  Mid Stent      185                    biphasic               +---------------+--------+---------------+--------+------------+  Distal Stent   232                    biphasic              +---------------+--------+---------------+--------+------------+  Distal to [id number]                    biphasic              +---------------+--------+---------------+--------+------------+   Right CFA monophasic waveform 318 cm/s   +--------------+--------+--------+----------+--------+  EIA          PSV cm/sStenosisWaveform  Comments  +--------------+--------+--------+----------+--------+  Proximal Stent205             monophasic          +--------------+--------+--------+----------+--------+  Mid Stent     153             biphasic            +--------------+--------+--------+----------+--------+  Distal Stent  157             biphasic            +--------------+--------+--------+----------+--------+   Summary:  IVC/Iliac: Patent left CIA stent with increased velocity in the 50 - 99 %  stenosis range (slight curve),  Velocity in the distal EIA consistent with 50 - 99% stenosis range.  The left CIA stent not well visualized, limited exam.   +----------+--------+-----+--------+--------+--------+  LEFT     PSV cm/sRatioStenosisWaveformComments  +----------+--------+-----+--------+--------+--------+  EIA Distal235                  biphasic          +----------+--------+-----+--------+--------+--------+     Left Graft #1: Femoral to AK popliteal bypass  +--------------------+--------+---------------+--------+-------------------                      PSV cm/sStenosis       WaveformComments            +--------------------+--------+---------------+--------+-------------------  Inflow             271  50-74% stenosisbiphasic                    +--------------------+--------+---------------+--------+-------------------  Proximal Anastomosis178                    biphasic                     +--------------------+--------+---------------+--------+-------------------  Proximal Graft      130                    biphasic                    +--------------------+--------+---------------+--------+-------------------  Mid Graft           79                     biphasic                    +--------------------+--------+---------------+--------+-------------------  Distal Graft        108                    biphasic2.47 x 2.9 cm fluid  +--------------------+--------+---------------+--------+-------------------  Distal Anastomosis  174                    biphasic                    +--------------------+--------+---------------+--------+-------------------  Outflow            161                    biphasic                    +--------------------+--------+---------------+--------+-------------------    Summary:  Left: 50-74% stenosis noted in the iliac segment. Patent left femoral to popliteal bypass graft with 2.47 x 2.85 cm fluid collection at the distal thigh area and with fluid noted within what appears to be the fascia / muscle   Previous ABI's/TBI's on 11/23/2022: Right:  0 - Great toe pressure: 0 Left:  0.41/0.35 - Great toe pressure:  41   ASSESSMENT/PLAN:: 63 y.o. female here for follow up for PAD with hx of right axillary to CFA with 8mm ringed PTFE on 11/29/2022 by Dr. Serene for right foot ulcer.  She underwent angiogram on 08/07/2023 and had stent of the left CIA and left EIA by Dr. Serene for left foot ulcer and subsequently underwent left femoral to AK popliteal artery bypass graft with PTFE and left EIA stent on 08/22/2023 by Dr. Serene.   -pt with palpable left DP pulse and wounds on left foot have healed. Her duplex today showed some elevated velocities in the CIA stent with a slight curve.  Given she has palpable DP pulse, these are most likely due to the slight curve.   -she does have a seroma and pt seen with Dr. Serene. Will give body time  to absorb this over time.  Discussed if she starts having fevers or it develops redness, she would need to be washed out.   -will have her f/u in 4 weeks for incision check.  At that time, will need to order follow up arterial studies and duplex for 6 months. -continue asa/statin/plavix     Lucie Apt, [facility] [phone]  Clinic MD:   Serene

## 2023-12-24 ENCOUNTER — Ambulatory Visit: Payer: Self-pay | Attending: Surgery | Admitting: Physician Assistant

## 2023-12-24 ENCOUNTER — Ambulatory Visit (HOSPITAL_COMMUNITY)
Admission: RE | Admit: 2023-12-24 | Discharge: 2023-12-24 | Disposition: A | Discharge status: Home / Self Care | Payer: Self-pay | Source: Ambulatory Visit | Attending: Surgery | Admitting: Surgery

## 2023-12-24 ENCOUNTER — Encounter: Payer: Self-pay | Admitting: Physician Assistant

## 2023-12-24 ENCOUNTER — Ambulatory Visit (HOSPITAL_COMMUNITY)
Admission: RE | Admit: 2023-12-24 | Discharge: 2023-12-24 | Disposition: A | Discharge status: Home / Self Care | Payer: Self-pay | Source: Ambulatory Visit | Attending: Physician Assistant | Admitting: Physician Assistant

## 2023-12-24 VITALS — BP 168/72 | HR 83 | Temp 97.9°F | Ht 62.0 in | Wt 153.7 lb

## 2023-12-24 DIAGNOSIS — I70243 Atherosclerosis of native arteries of left leg with ulceration of ankle: Secondary | ICD-10-CM | POA: Insufficient documentation

## 2023-12-24 DIAGNOSIS — I70213 Atherosclerosis of native arteries of extremities with intermittent claudication, bilateral legs: Secondary | ICD-10-CM

## 2023-12-24 LAB — VAS US ABI WITH/WO TBI
Left ABI: 0.95
Right ABI: 0.71

## 2023-12-27 ENCOUNTER — Ambulatory Visit (INDEPENDENT_AMBULATORY_CARE_PROVIDER_SITE_OTHER): Payer: Self-pay | Admitting: Student

## 2023-12-27 VITALS — BP 115/71 | HR 92 | Temp 98.2°F | Ht 62.0 in | Wt 153.2 lb

## 2023-12-27 DIAGNOSIS — Z0000 Encounter for general adult medical examination without abnormal findings: Secondary | ICD-10-CM

## 2023-12-27 DIAGNOSIS — E119 Type 2 diabetes mellitus without complications: Secondary | ICD-10-CM

## 2023-12-27 DIAGNOSIS — Z1231 Encounter for screening mammogram for malignant neoplasm of breast: Secondary | ICD-10-CM

## 2023-12-27 DIAGNOSIS — F32A Depression, unspecified: Secondary | ICD-10-CM

## 2023-12-27 DIAGNOSIS — Z794 Long term (current) use of insulin: Secondary | ICD-10-CM

## 2023-12-27 DIAGNOSIS — F419 Anxiety disorder, unspecified: Secondary | ICD-10-CM

## 2023-12-27 LAB — GLUCOSE, CAPILLARY: Glucose-Capillary: 273 mg/dL — ABNORMAL HIGH (ref 70–99)

## 2023-12-27 NOTE — Progress Notes (Unsigned)
   Established Patient Office Visit  Subjective   Patient ID: BURNA ATLAS, female    DOB: Aug 31, 1960  Age: 63 y.o. MRN: [id number]  Chief Complaint  Patient presents with   2wk follow up    HPI      ROS    Objective:     BP 115/71 (BP Location: Right Arm, Patient Position: Sitting, Cuff Size: Small)   Pulse 92   Temp 98.2 F (36.8 C) (Oral)   Ht 5' 2 (1.575 m)   Wt 153 lb 3.2 oz (69.5 kg)   LMP 12/24/2013   SpO2 92%   BMI 28.02 kg/m  BP Readings from Last 3 Encounters:  12/27/23 115/71  12/24/23 (!) 168/72  12/13/23 (!) 109/45   Wt Readings from Last 3 Encounters:  12/27/23 153 lb 3.2 oz (69.5 kg)  12/24/23 153 lb 11.2 oz (69.7 kg)  12/13/23 156 lb 3.2 oz (70.9 kg)      Physical Exam   Results for orders placed or performed in visit on 12/27/23  Glucose, capillary  Result Value Ref Range   Glucose-Capillary 273 (H) 70 - 99 mg/dL    Last hemoglobin J8r Lab Results  Component Value Date   HGBA1C >14.0 (A) 12/13/2023   Lab Results  Component Value Date   LABMICR 557.6 12/13/2023   LABMICR See below: 10/25/2022   MICROALBUR 31.03 (H) 12/01/2010   MICROALBUR 54.57 (H) 11/25/2009    The 10-year ASCVD risk score (Arnett DK, et al., 2019) is: 13%    Assessment & Plan:  Diabetes  Lab Results  Component Value Date   HGBA1C >14.0 (A) 12/13/2023   - OP medication Basaglar  30 units daily, previously on Soliquia, cancelled with medicaid.  - FSBG: <300, no symptoms no lows she noted. NO dizzineess or lightheadness.  - Has not followed up with Arland,  - Urine 1,303 ; SGLT2i contraindicated with yeast infection - Lisinpriol 5 mg, ACEi   Depression and Anxiety  PHQ 9 score 11 ; GAD 7 score 10 - Wellbutrin  150 BID - Celexa  40 mg daily   Health maintenance -breast cancer screening  Problem List Items Addressed This Visit   None Visit Diagnoses       Breast cancer screening by mammogram    -  Primary   Relevant Orders   MM 3D SCREENING  MAMMOGRAM BILATERAL BREAST       No follow-ups on file.    Toma Edwards, DO

## 2023-12-27 NOTE — Patient Instructions (Signed)
 Thank you, Ms.[name] for allowing us  to provide your care today. Today we discussed:  For your diabetes - Please increase Basaglar  to 36 units daily - Please continue checking your blood sugars in the morning, please write on a piece of paper - As such Arland, our diabetic owner, please schedule an appointment with her as soon as possible  I also sent a referral for your mammogram, they will call you to schedule an appointment  I would like to see you back in 2 weeks so we can discuss about your diabetes  I have ordered the following labs for you:   Lab Orders         Glucose, capillary      Tests ordered today:  Glucose check  Referrals ordered today:   Referral Orders  No referral(s) requested today     I have ordered the following medication/changed the following medications:   Stop the following medications: There are no discontinued medications.   Start the following medications: No orders of the defined types were placed in this encounter.    Follow up: 2 weeks with physician for diabetes, as soon as possible with Arland   Remember:   Should you have any questions or concerns please call the internal medicine clinic at [phone].     Rayann Atway, D.O. [facility]

## 2023-12-30 NOTE — Assessment & Plan Note (Signed)
 PHQ-9 score of 11, GAD-7 score of 10. - Continue Wellbutrin  150 mg twice daily - Continue Celexa  40 mg daily

## 2023-12-30 NOTE — Assessment & Plan Note (Signed)
 Per the last office visit, on 12/13/2023, A1c greater than 14.0.  Patient is currently on Basaglar  30 units daily.  Patient reports that she was previously on Soliqua , however that was canceled due to changes in insurance to Medicaid.  Reports that she has not had a chance to have an appointment with the diabetes coordinator, Arland.  She currently does not have a CGM monitor to check blood sugars.  Reports that manual check in the morning fasting is roughly between 250-350.  Denies any hypoglycemic events.  Reports that she cannot tolerate SGLT2 inhibitors with prior yeast infections.  She is currently on lisinopril  5 mg daily, for renal protection. - Referral to diabetes coordinator for possible CGM monitor - 2 weeks follow-up for diabetes

## 2023-12-30 NOTE — Assessment & Plan Note (Signed)
 Breast cancer screening mammogram referral sent.

## 2023-12-31 ENCOUNTER — Other Ambulatory Visit: Payer: Self-pay

## 2023-12-31 ENCOUNTER — Other Ambulatory Visit: Payer: Self-pay | Admitting: Dietician

## 2023-12-31 ENCOUNTER — Encounter: Payer: Self-pay | Admitting: Hematology and Oncology

## 2023-12-31 ENCOUNTER — Ambulatory Visit: Payer: Self-pay | Admitting: Dietician

## 2023-12-31 DIAGNOSIS — E119 Type 2 diabetes mellitus without complications: Secondary | ICD-10-CM

## 2023-12-31 DIAGNOSIS — Z794 Long term (current) use of insulin: Secondary | ICD-10-CM

## 2023-12-31 MED ORDER — DEXCOM G7 SENSOR MISC
3 refills | Status: DC
  Filled 2023-12-31: qty 9, fill #0

## 2023-12-31 MED ORDER — DEXCOM G7 RECEIVER DEVI
1 refills | Status: DC
  Filled 2023-12-31: qty 1, fill #0

## 2023-12-31 NOTE — Telephone Encounter (Signed)
 Request Continuous glucose monitoring supplies for her appointment today.

## 2023-12-31 NOTE — Patient Instructions (Signed)
 Thank you for your visit today!  Consider entering comments on your phone about why your blood sugar is doing what it is doing.  You said it is rice, pasta and bread and watermelon are often culprits. Portions are key or eating protein and veggies first before you eat the things that increase your blood sugar- CARBS!!!  Vanessa [name] [phone] (new number)

## 2023-12-31 NOTE — Progress Notes (Addendum)
   States she recently lost her insurance. Is willing to use Freestyle Libre 3 if less expensive. patient willing to pay out of pocket for [facility] 3 since is is often less expensive per month with discount  to 75$ Started patient on Dexcom G7 today per her request. She was given a sample and is using her phone. She is sharing her CGM data remotely.  Lab Results  Component Value Date   HGBA1C >14.0 (A) 12/13/2023   HGBA1C 12.4 (A) 06/12/2023   HGBA1C 9.8 (H) 11/22/2022   HGBA1C 10.6 (A) 10/25/2022   HGBA1C 10.4 (A) 05/29/2022    Diabetes Self-Management Education  Visit Type: First/Initial  Appt. Start Time: 315 Appt. End Time: 345  12/31/2023  Ms. Kyren Vaux, identified by name and date of birth, is a 63 y.o. female with a diagnosis of Diabetes: Type 2.   ASSESSMENT  Last menstrual period 12/24/2013. There is no height or weight on file to calculate BMI.   Diabetes Self-Management Education - 12/31/23 1500       Visit Information   Visit Type First/Initial      Initial Visit   Diabetes Type Type 2    Are you currently following a meal plan? Yes    What type of meal plan do you follow? aware of foods that increase blood sugar and knows how much she should be eating of them but eats more. some meals are chicken and rice with soup as gravy. likes watermelon, bread and pasta too    Are you taking your medications as prescribed? Yes      Psychosocial Assessment   Patient Belief/Attitude about Diabetes Motivated to manage diabetes   prefers eating what sh elikes and tkaing medicine if needed   What is the hardest part about your diabetes right now, causing you the most concern, or is the most worrisome to you about your diabetes?   Checking blood sugar   dislikes pricking her fingers   Self-care barriers Lack of material resources   just lost insurance, has holes in roof of house, cares for spouse   Self-management support Doctor's office;CDE visits;Family    Patient Concerns  Glycemic Control;Monitoring    Special Needs None    Preferred Learning Style No preference indicated   not interested in handouts or keeping food records with CGM   Learning Readiness Ready    How often do you need to have someone help you when you read instructions, pamphlets, or other written materials from your doctor or pharmacy? 3 - Sometimes          Individualized Plan for Diabetes Self-Management Training:   Learning Objective:  Patient will have a greater understanding of diabetes self-management. Patient education plan is to attend individual and/or group sessions per assessed needs and concerns.   Plan:   Patient Instructions  Thank you for your visit today!  Consider entering comments on your phone about why your blood sugar is doing what it is doing.  You said it is rice, pasta and bread and watermelon are often culprits. Portions are key or eating protein and veggies first before you eat the things that increase your blood sugar- CARBS!!!  Arland [phone] (new number)    Expected Outcomes:     Education material provided: Diabetes Resources  If problems or questions, patient to contact team via:  Phone  Future DSME appointment:  2 weeks Arland Hole, RD 12/31/2023 4:11 PM.

## 2024-01-03 NOTE — Progress Notes (Signed)
 Internal Medicine Clinic Attending  Case discussed with the resident at the time of the visit.  We reviewed the resident's history and exam and pertinent patient test results.  I agree with the assessment, diagnosis, and plan of care documented in the resident's note.

## 2024-01-08 ENCOUNTER — Telehealth: Payer: Self-pay

## 2024-01-08 NOTE — Telephone Encounter (Signed)
 Telephoned patient at mobile number. Patient requested mammogram scholarship application. Confirmed patient's address.[facility]

## 2024-01-10 ENCOUNTER — Encounter: Admitting: Student

## 2024-01-10 ENCOUNTER — Encounter: Payer: Self-pay | Admitting: Dietician

## 2024-01-10 NOTE — Progress Notes (Deleted)
   Established Patient Office Visit  Subjective   Patient ID: Vanessa [name], female    DOB: 07/13/1960  Age: 63 y.o. MRN: [id number]  No chief complaint on file.   HPI This is a 63 year old female with past medical history of type 2 diabetes on insulin , hypertension, severe PAD status post right axillofemoral bypass, COPD presenting today for a 2-week follow-up on diabetes.  ROS   As per assessment and plan Objective:     LMP 12/24/2013  BP Readings from Last 3 Encounters:  12/27/23 115/71  12/24/23 (!) 168/72  12/13/23 (!) 109/45   Wt Readings from Last 3 Encounters:  12/27/23 153 lb 3.2 oz (69.5 kg)  12/24/23 153 lb 11.2 oz (69.7 kg)  12/13/23 156 lb 3.2 oz (70.9 kg)      Physical Exam   No results found for any visits on 01/10/24.  Last metabolic panel Lab Results  Component Value Date   GLUCOSE 485 (H) 12/13/2023   NA 128 (L) 12/13/2023   K 4.8 12/13/2023   CL 92 (L) 12/13/2023   CO2 18 (L) 12/13/2023   BUN 17 12/13/2023   CREATININE 0.60 12/13/2023   EGFR 101 12/13/2023   CALCIUM  9.5 12/13/2023   PHOS 3.8 12/05/2022   PROT 6.7 08/22/2023   ALBUMIN 3.4 (L) 08/22/2023   BILITOT 1.1 08/22/2023   ALKPHOS 92 08/22/2023   AST 25 08/22/2023   ALT 12 08/22/2023   ANIONGAP 12 08/23/2023   Last lipids Lab Results  Component Value Date   CHOL 181 08/23/2023   HDL 25 (L) 08/23/2023   LDLCALC 90 08/23/2023   LDLDIRECT 158 (H) 12/03/2014   TRIG 329 (H) 08/23/2023   CHOLHDL 7.2 08/23/2023   Last hemoglobin A1c Lab Results  Component Value Date   HGBA1C >14.0 (A) 12/13/2023      The 10-year ASCVD risk score (Arnett DK, et al., 2019) is: 13%    Assessment & Plan:  Type II diabetes, insulin -dependent A1c greater than 4.0, checked on 12/13/2023.  Patient had a visit with Arland, diabetes educator, she was started on Dexcom G7.  CGM data today showed  - Patient is currently taking Basaglar  30 units daily.  Able to tolerate SGLT2 inhibitors due to prior  yeast infections.    Problem List Items Addressed This Visit   None   No follow-ups on file.    Toma Edwards, DO

## 2024-01-16 ENCOUNTER — Encounter

## 2024-01-16 NOTE — Progress Notes (Deleted)
 Office Note     CC:  follow up Requesting Provider:  Jolaine Pac, DO  HPI: Vanessa [name] is a 63 y.o. (1960/08/27) female who presents for incision check.  She has history of right axillary to common femoral artery bypass with PTFE on 11/29/2022 by Dr. Serene due to right foot ulcer.  She underwent angiogram on 08/07/2023 and had stenting of the left common iliac artery and external iliac artery by Dr. Serene due to left foot ulcer and subsequently underwent left femoral to above-the-knee popliteal artery bypass with PTFE on 08/22/2023.  She was seen in the office last month and found to have a seroma at the above-the-knee incision.  She denies any fevers, chills, nausea/vomiting.  She also denies any drainage.  She also experienced fluid collection related to her axillary to femoral bypass.  ***   Past Medical History:  Diagnosis Date   Asthma    No PFT performed   Blood transfusion without reported diagnosis    has to donate blood due to having thick blood.   Complication of anesthesia    Woken up afterwards with panic attacks   COPD (chronic obstructive pulmonary disease) (HCC)    Depression    Diabetes mellitus 2002   Dyspnea    with exertion   GERD (gastroesophageal reflux disease)    Helicobacter pylori (H. pylori) infection    s/p triple therapy   Hepatic hemangioma    History of heart murmur in childhood    History of kidney stones    Hyperlipidemia    Hypertension    Hypertriglyceridemia    Panic attacks    mostly Agaraphobia   Peripheral arterial disease (HCC)    Peripheral vascular disease (HCC)    Pneumonia    Ventral hernia     Past Surgical History:  Procedure Laterality Date   ABDOMINAL AORTOGRAM W/LOWER EXTREMITY N/A 11/24/2022   Procedure: ABDOMINAL AORTOGRAM W/LOWER EXTREMITY;  Surgeon: Magda Debby SAILOR, MD;  Location: [facility];  Service: Cardiovascular;  Laterality: N/A;   ABDOMINAL AORTOGRAM W/LOWER EXTREMITY N/A 08/07/2023   Procedure:  ABDOMINAL AORTOGRAM W/LOWER EXTREMITY;  Surgeon: Serene Gaile ORN, MD;  Location: [facility];  Service: Cardiovascular;  Laterality: N/A;   AMPUTATION Right 12/02/2022   Procedure: RAY AMPUTATION OF  RIGHT 5TH & 4TH TOES;  Surgeon: Malvin Marsa FALCON, DPM;  Location: [facility];  Service: Podiatry;  Laterality: Right;   APPLICATION OF WOUND VAC Right 11/29/2022   Procedure: APPLICATION OF WOUND VAC AND KERECIS MICRO WOUND GRAFT;  Surgeon: Serene Gaile ORN, MD;  Location: [facility];  Service: Vascular;  Laterality: Right;   AXILLARY-FEMORAL BYPASS GRAFT Right 11/29/2022   Procedure: RIGHT AXILLA ARTERY - RIGHT FEMORAL ARTERY BYPASS GRAFT USING 8mm X 70cm PROPATEN GORE GRAFT WITH REMOVABLE RINGS;  Surgeon: Serene Gaile ORN, MD;  Location: [facility];  Service: Vascular;  Laterality: Right;   CESAREAN SECTION     with 3 children   COLONOSCOPY  2018   ENDARTERECTOMY FEMORAL Left 08/22/2023   Procedure: LEFT ENDARTERECTOMY FEMORAL;  Surgeon: Serene Gaile ORN, MD;  Location: [facility];  Service: Vascular;  Laterality: Left;   FEMORAL-POPLITEAL BYPASS GRAFT Left 08/22/2023   Procedure: LEFT BYPASS GRAFT FEMORAL-POPLITEAL ARTERY;  Surgeon: Serene Gaile ORN, MD;  Location: [facility];  Service: Vascular;  Laterality: Left;   INSERTION OF ILIAC STENT Left 08/22/2023   Procedure: INSERTION OF ILIAC STENT - LEFT;  Surgeon: Serene Gaile ORN, MD;  Location: [facility];  Service:  Vascular;  Laterality: Left;   OVARIAN CYST REMOVAL     Age 59   PERIPHERAL VASCULAR INTERVENTION Left 08/07/2023   Procedure: PERIPHERAL VASCULAR INTERVENTION;  Surgeon: Serene Gaile ORN, MD;  Location: [facility];  Service: Cardiovascular;  Laterality: Left;  Iliac   ROBOTIC ASSISTED BILATERAL SALPINGO OOPHERECTOMY Bilateral 11/23/2021   Procedure: XI ROBOTIC ASSISTED BILATERAL SALPINGO OOPHORECTOMY,  MINI LAPAROTOMY;  Surgeon: Viktoria Comer SAUNDERS, MD;  Location: [facility];  Service: Gynecology;  Laterality: Bilateral;   ROBOTIC ASSISTED TOTAL  HYSTERECTOMY N/A 11/23/2021   Procedure: XI ROBOTIC ASSISTED TOTAL HYSTERECTOMY, STAGING, CYSTOSCOPY;  Surgeon: Viktoria Comer SAUNDERS, MD;  Location: [facility];  Service: Gynecology;  Laterality: N/A;   TUBAL LIGATION     UPPER GASTROINTESTINAL ENDOSCOPY  2018    Social History   Socioeconomic History   Marital status: Married    Spouse name: Not on file   Number of children: Not on file   Years of education: Not on file   Highest education level: Not on file  Occupational History   Not on file  Tobacco Use   Smoking status: Former    Current packs/day: 1.00    Average packs/day: 1 pack/day for 43.0 years (43.0 ttl pk-yrs)    Types: Cigarettes   Smokeless tobacco: Never   Tobacco comments:    Wants Wellbutrin    Vaping Use   Vaping status: Every Day   Substances: Nicotine   Substance and Sexual Activity   Alcohol use: No    Alcohol/week: 0.0 standard drinks of alcohol    Comment: none   Drug use: No   Sexual activity: Not Currently  Other Topics Concern   Not on file  Social History Narrative   Drinks at least a pot of coffee every day.    Social Drivers of Corporate investment banker Strain: Not on file  Food Insecurity: No Food Insecurity (08/22/2023)   Hunger Vital Sign    Worried About Running Out of Food in the Last Year: Never true    Ran Out of Food in the Last Year: Never true  Transportation Needs: No Transportation Needs (08/22/2023)   PRAPARE - Administrator, Civil Service (Medical): No    Lack of Transportation (Non-Medical): No  Physical Activity: Not on file  Stress: Not on file  Social Connections: Moderately Isolated (10/25/2022)   Social Connection and Isolation Panel    Frequency of Communication with Friends and Family: More than three times a week    Frequency of Social Gatherings with Friends and Family: More than three times a week    Attends Religious Services: Never    Database administrator or Organizations: No    Attends Tax inspector Meetings: Never    Marital Status: Married  Catering manager Violence: Not At Risk (08/22/2023)   Humiliation, Afraid, Rape, and Kick questionnaire    Fear of Current or Ex-Partner: No    Emotionally Abused: No    Physically Abused: No    Sexually Abused: No    Family History  Problem Relation Age of Onset   Colon polyps Mother        benign   Ovarian cancer Mother 49   Breast cancer Maternal Aunt        dx late 57s   Diabetes Maternal Uncle    Colon cancer Neg Hx    Stomach cancer Neg Hx    Endometrial cancer Neg Hx    Prostate cancer Neg  Hx    Pancreatic cancer Neg Hx    Esophageal cancer Neg Hx    Rectal cancer Neg Hx     Current Outpatient Medications  Medication Sig Dispense Refill   acetaminophen  (TYLENOL ) 500 MG tablet Take 1,000 mg by mouth 2 (two) times daily as needed for mild pain or moderate pain.     albuterol  (VENTOLIN  HFA) 108 (90 Base) MCG/ACT inhaler Inhale 1-2 puffs into the lungs every 6 (six) hours as needed for wheezing or shortness of breath. 18 g 3   aspirin  EC 81 MG tablet Take 1 tablet (81 mg total) by mouth daily at 6 (six) AM. Swallow whole. 30 tablet 12   atorvastatin  (LIPITOR) 80 MG tablet Take 1 tablet (80 mg total) by mouth daily. 90 tablet 2   buPROPion  (WELLBUTRIN  SR) 150 MG 12 hr tablet Take 1 tablet (150 mg total) by mouth 2 (two) times daily. 60 tablet 0   cetirizine  (ZYRTEC ) 10 MG tablet Take 10 mg by mouth daily as needed for allergies.     cimetidine  (TAGAMET ) 200 MG tablet Take 200 mg by mouth 2 (two) times daily.     citalopram  (CELEXA ) 40 MG tablet Take 1 tablet (40 mg total) by mouth daily. 90 tablet 3   clopidogrel  (PLAVIX ) 75 MG tablet Take 1 tablet (75 mg total) by mouth daily. 30 tablet 6   Continuous Glucose Receiver (DEXCOM G7 RECEIVER) DEVI Use with Dexcom G7 sensors to monitor your glucose continuously 1 each 1   Continuous Glucose Sensor (DEXCOM G7 SENSOR) MISC Place new sensor every 10 days. Use to monitor blood  sugar continuously. 9 each 3   ezetimibe  (ZETIA ) 10 MG tablet Take 1 tablet (10 mg total) by mouth daily. 30 tablet 3   Fluticasone -Umeclidin-Vilant (TRELEGY ELLIPTA ) 200-62.5-25 MCG/ACT AEPB Inhale 1 puff into the lungs daily. 60 each 9   gabapentin  (NEURONTIN ) 300 MG capsule Take 1 capsule (300 mg total) by mouth at bedtime. 30 capsule 2   glucose blood test strip Use as instructed 100 each 12   ibuprofen  (ADVIL ) 200 MG tablet Take 600 mg by mouth every 6 (six) hours as needed for mild pain (pain score 1-3) or moderate pain (pain score 4-6).     Insulin  Glargine (BASAGLAR  KWIKPEN) 100 UNIT/ML Inject 30 Units into the skin daily. 15 mL 6   Insulin  Pen Needle (PEN NEEDLES) 31G X 5 MM MISC 1 each by Does not apply route daily. Use to inject insulin  100 each 0   lisinopril  (ZESTRIL ) 5 MG tablet Take 1 tablet (5 mg total) by mouth daily. 30 tablet 0   tiotropium (SPIRIVA ) 18 MCG inhalation capsule Place 18 mcg into inhaler and inhale daily.     No current facility-administered medications for this visit.    Allergies  Allergen Reactions   Jardiance  [Empagliflozin ] Other (See Comments)    Severe yeast infection   Latex Rash   Avandia [Rosiglitazone] Other (See Comments)    Headaches      REVIEW OF SYSTEMS:   [X]  denotes positive finding, [ ]  denotes negative finding Cardiac  Comments:  Chest pain or chest pressure:    Shortness of breath upon exertion:    Short of breath when lying flat:    Irregular heart rhythm:        Vascular    Pain in calf, thigh, or hip brought on by ambulation:    Pain in feet at night that wakes you up from your sleep:  Blood clot in your veins:    Leg swelling:         Pulmonary    Oxygen at home:    Productive cough:     Wheezing:         Neurologic    Sudden weakness in arms or legs:     Sudden numbness in arms or legs:     Sudden onset of difficulty speaking or slurred speech:    Temporary loss of vision in one eye:     Problems with  dizziness:         Gastrointestinal    Blood in stool:     Vomited blood:         Genitourinary    Burning when urinating:     Blood in urine:        Psychiatric    Major depression:         Hematologic    Bleeding problems:    Problems with blood clotting too easily:        Skin    Rashes or ulcers:        Constitutional    Fever or chills:      PHYSICAL EXAMINATION:  There were no vitals filed for this visit.  General:  WDWN in NAD; vital signs documented above Gait: Not observed HENT: WNL, normocephalic Pulmonary: normal non-labored breathing Cardiac: regular HR Abdomen: soft, NT, no masses Skin: without rashes Vascular Exam/Pulses: *** Extremities: {With/Without:20273} ischemic changes, {With/Without:20273} Gangrene , {With/Without:20273} cellulitis; {With/Without:20273} open wounds;  Musculoskeletal: no muscle wasting or atrophy  Neurologic: A&O X 3 Psychiatric:  The pt has Normal affect.   Non-Invasive Vascular Imaging:   ***    ASSESSMENT/PLAN:: 63 y.o. female here for reevaluation of her left above the knee incision noted to have a seroma at her prior office visit   -***   Donnice Sender, PA-C [facility] [phone]  Clinic MD:   ***

## 2024-01-21 ENCOUNTER — Ambulatory Visit: Attending: Student

## 2024-01-23 ENCOUNTER — Encounter: Payer: Self-pay | Admitting: Student

## 2024-01-23 ENCOUNTER — Other Ambulatory Visit (HOSPITAL_COMMUNITY): Payer: Self-pay

## 2024-01-23 ENCOUNTER — Encounter: Payer: Self-pay | Admitting: Hematology and Oncology

## 2024-01-23 ENCOUNTER — Telehealth: Payer: Self-pay | Admitting: Dietician

## 2024-01-23 ENCOUNTER — Encounter: Admitting: Student

## 2024-01-23 ENCOUNTER — Encounter: Admitting: Dietician

## 2024-01-23 DIAGNOSIS — E119 Type 2 diabetes mellitus without complications: Secondary | ICD-10-CM

## 2024-01-23 NOTE — Telephone Encounter (Signed)
 Called patient due to missed appointments. She said she did not come because she feels bad today.  Let her know the CGM study is not open to our patients yet, but to keep asking. Asked if she wanted a prescription for the [facility] 3 plus sensors as this will be the lowest cost CGM without insurance.she does. She has the phone number for the voucher to lower the price. Lastly, she agreed to have her CGM report mailed to her. Suggest follow up when feeling better.

## 2024-01-24 MED ORDER — FREESTYLE LIBRE 3 PLUS SENSOR MISC: 11 refills | Status: DC

## 2024-02-07 ENCOUNTER — Other Ambulatory Visit: Payer: Self-pay | Admitting: Student

## 2024-02-07 DIAGNOSIS — J449 Chronic obstructive pulmonary disease, unspecified: Secondary | ICD-10-CM

## 2024-02-20 NOTE — Telephone Encounter (Signed)
 Rtn Call no answer Copied from CRM [id number]. Topic: General - Other >> Feb 19, 2024  8:04 AM Alfonso ORN wrote: Reason for CRM: patient returning a call she received  Please contact patient back  Pt. Call back # [phone]

## 2024-02-23 ENCOUNTER — Other Ambulatory Visit: Payer: Self-pay | Admitting: Physician Assistant

## 2024-02-23 ENCOUNTER — Other Ambulatory Visit: Payer: Self-pay

## 2024-02-23 ENCOUNTER — Encounter: Payer: Self-pay | Admitting: Hematology and Oncology

## 2024-02-23 DIAGNOSIS — I739 Peripheral vascular disease, unspecified: Secondary | ICD-10-CM

## 2024-02-29 ENCOUNTER — Other Ambulatory Visit: Payer: Self-pay

## 2024-03-18 ENCOUNTER — Other Ambulatory Visit: Payer: Self-pay

## 2024-03-18 ENCOUNTER — Ambulatory Visit: Payer: Self-pay | Admitting: Student

## 2024-03-18 ENCOUNTER — Encounter: Payer: Self-pay | Admitting: Hematology and Oncology

## 2024-03-18 VITALS — BP 121/85 | HR 98 | Temp 98.6°F | Ht 62.0 in | Wt 158.2 lb

## 2024-03-18 DIAGNOSIS — F419 Anxiety disorder, unspecified: Secondary | ICD-10-CM

## 2024-03-18 DIAGNOSIS — F17210 Nicotine dependence, cigarettes, uncomplicated: Secondary | ICD-10-CM

## 2024-03-18 DIAGNOSIS — Z23 Encounter for immunization: Secondary | ICD-10-CM

## 2024-03-18 DIAGNOSIS — J449 Chronic obstructive pulmonary disease, unspecified: Secondary | ICD-10-CM

## 2024-03-18 DIAGNOSIS — F32A Depression, unspecified: Secondary | ICD-10-CM

## 2024-03-18 DIAGNOSIS — F17200 Nicotine dependence, unspecified, uncomplicated: Secondary | ICD-10-CM

## 2024-03-18 DIAGNOSIS — E119 Type 2 diabetes mellitus without complications: Secondary | ICD-10-CM

## 2024-03-18 DIAGNOSIS — I10 Essential (primary) hypertension: Secondary | ICD-10-CM

## 2024-03-18 DIAGNOSIS — Z794 Long term (current) use of insulin: Secondary | ICD-10-CM

## 2024-03-18 LAB — POCT GLYCOSYLATED HEMOGLOBIN (HGB A1C): HbA1c, POC (controlled diabetic range): 11.1 % — AB (ref 0.0–7.0)

## 2024-03-18 LAB — GLUCOSE, CAPILLARY: Glucose-Capillary: 328 mg/dL — ABNORMAL HIGH (ref 70–99)

## 2024-03-18 MED ORDER — LISINOPRIL 5 MG PO TABS
5.0000 mg | ORAL_TABLET | Freq: Every day | ORAL | 3 refills | Status: DC
  Filled 2024-03-18: qty 90, 90d supply, fill #0

## 2024-03-18 MED ORDER — BUPROPION HCL ER (SR) 150 MG PO TB12
150.0000 mg | ORAL_TABLET | Freq: Two times a day (BID) | ORAL | 0 refills | Status: DC
  Filled 2024-03-18: qty 60, 30d supply, fill #0

## 2024-03-18 MED ORDER — BASAGLAR KWIKPEN 100 UNIT/ML ~~LOC~~ SOPN
36.0000 [IU] | PEN_INJECTOR | Freq: Every day | SUBCUTANEOUS | 6 refills | Status: AC
Start: 2024-03-18 — End: ?
  Filled 2024-03-18: qty 12, 33d supply, fill #0

## 2024-03-18 NOTE — Progress Notes (Unsigned)
 Diabetes mellitus, type II, insulin  dependent (HCC) (Chronic)     Per the last office visit, on 12/13/2023, A1c greater than 14.0.  Patient is currently on Basaglar  30 units daily.  Patient reports that she was previously on Soliqua , however that was canceled due to changes in insurance to Medicaid.  Reports that she has not had a chance to have an appointment with the diabetes coordinator, Ar[place].  She currently does not have a CGM monitor to check blood sugars.  Reports that manual check in the morning fasting is roughly between 250-350.  Denies any hypoglycemic events.  Reports that she cannot tolerate SGLT2 inhibitors with prior yeast infections.  She is currently on lisinopril  5 mg daily, for renal protection. - Referral to diabetes coordinator for possible CGM monitor - 2 weeks follow-up for diabetes        Relevant Orders    Referral to Nutrition and Diabetes Services        Other    Anxiety and depression    PHQ-9 score of 11, GAD-7 score of 10. - Continue Wellbutrin  150 mg twice daily - Continue Celexa  40 mg daily     Hasn't been checking sugars 30 units basaglar , increase to 36 Could try cgm in the future AM in the 250  Trelegy samples or DOH list  Follow up with [facility]- may wait until insurance   Denies SI    CC: Routine Follow Up for diabetes after last office visit 12/27/2023  HPI:  Vanessa [name] is a 63 y.o. female with pertinent PMH of T2DM on insulin , HTN, severe PAD s/p right axillary femoral bypass, COPD who presents as above. Please see assessment and plan below for further details.  Medications: Current Outpatient Medications  Medication Instructions   acetaminophen  (TYLENOL ) 1,000 mg, 2 times daily PRN   albuterol  (VENTOLIN  HFA) 108 (90 Base) MCG/ACT inhaler INHALE 1 TO 2 PUFFS BY MOUTH EVERY 6 HOURS AS NEEDED FOR WHEEZING FOR SHORTNESS OF BREATH   aspirin  EC 81 mg, Oral, Daily, Swallow whole.   atorvastatin  (LIPITOR) 80 mg, Oral, Daily   Basaglar  KwikPen 30  Units, Subcutaneous, Daily   buPROPion  (WELLBUTRIN  SR) 150 mg, Oral, 2 times daily   cetirizine  (ZYRTEC ) 10 mg, Daily PRN   cimetidine  (TAGAMET ) 200 mg, 2 times daily   citalopram  (CELEXA ) 40 mg, Oral, Daily   clopidogrel  (PLAVIX ) 75 mg, Oral, Daily   Continuous Glucose Receiver (DEXCOM G7 RECEIVER) DEVI Use with Dexcom G7 sensors to monitor your glucose continuously   Continuous Glucose Sensor (FREESTYLE LIBRE 3 PLUS SENSOR) MISC Change sensor every 15 days.   ezetimibe  (ZETIA ) 10 mg, Oral, Daily   Fluticasone -Umeclidin-Vilant (TRELEGY ELLIPTA ) 200-62.5-25 MCG/ACT AEPB 1 puff, Inhalation, Daily   gabapentin  (NEURONTIN ) 300 mg, Oral, Daily at bedtime   glucose blood test strip Use as instructed   ibuprofen  (ADVIL ) 600 mg, Every 6 hours PRN   Insulin  Pen Needle (PEN NEEDLES) 31G X 5 MM MISC 1 each, Does not apply, Daily, Use to inject insulin    lisinopril  (ZESTRIL ) 5 mg, Oral, Daily   tiotropium (SPIRIVA ) 18 mcg, Daily     Review of Systems:   Pertinent items noted in HPI and/or A&P.  Physical Exam:  There were no vitals filed for this visit.  Constitutional:***. In no acute distress. HEENT: Normocephalic, atraumatic, Sclera non-icteric, PERRL, EOM intact Cardio:Regular rate and rhythm. 2+ bilateral {PulseLoc:28294} pulses. Pulm:Clear to auscultation bilaterally. Normal work of breathing on room air. Abdomen: Soft, non-tender, non-distended, positive bowel sounds. FDX:Wzhjupcz  for extremity edema. Skin:Warm and dry. Neuro:Alert and oriented x3. No focal deficit noted. Psych:Pleasant mood and affect.   Assessment & Plan:   Assessment & Plan Diabetes mellitus, type II, insulin  dependent (HCC)  Essential hypertension, benign  Encounter for immunization  Tobacco dependence   No orders of the defined types were placed in this encounter.    Patient {GC/GE:3044014::discussed with,seen with} Dr. [place] [facility]  Fairy Pool, DO [facility] Internal  Medicine Resident PGY-3 Clinic Phone: [phone] Please contact the on call pager at [phone] for any urgent or emergent needs.

## 2024-03-18 NOTE — Patient Instructions (Signed)
 Thank you, Vanessa [name], for allowing us  to provide your care today. Today we discussed . . .  > Diabetes       - Your A1c has gotten a good bit better and is now 11.1.  We we will increase your insulin  from 30 units daily to 36 units daily.  I want you to start checking your blood sugar before your first meal at least and ideally 2 more times during the day.  At your next visit I would like you to bring in your glucometer so we can review the data and we can talk about putting on a continuous glucose monitor like Dexcom.  If you hear back from your insurance please let us  know as well. > Grief       - I think you are handling this very difficult situation extremely well and I want you to be aware that if you think we can help you should contact our office and we can talk about it.  If you are interested in the group therapy that palliative talk to about I think it would be a good idea to try it out and maybe encourage your family to do the same.  Please let us  know if we can help in any way.  I have ordered the following labs for you:   Lab Orders         Basic metabolic panel with GFR         Glucose, capillary         POC Hbg A1C       Follow up: 3 weeks    Remember:  Should you have any questions or concerns please call the internal medicine clinic at [phone].     Fairy Pool, DO [facility]

## 2024-03-19 LAB — BASIC METABOLIC PANEL WITH GFR
BUN/Creatinine Ratio: 18 (ref 12–28)
BUN: 11 mg/dL (ref 8–27)
CO2: 20 mmol/L (ref 20–29)
Calcium: 9.4 mg/dL (ref 8.7–10.3)
Chloride: 97 mmol/L (ref 96–106)
Creatinine, Ser: 0.62 mg/dL (ref 0.57–1.00)
Glucose: 338 mg/dL — ABNORMAL HIGH (ref 70–99)
Potassium: 4.7 mmol/L (ref 3.5–5.2)
Sodium: 133 mmol/L — ABNORMAL LOW (ref 134–144)
eGFR: 100 mL/min/1.73 (ref >59)

## 2024-03-20 NOTE — Assessment & Plan Note (Signed)
 Despite the emotional strain of losing her husband recently she has only relapsed once smoking part of a cigarette but had no enjoyment of it as she used to and does not have any desire to resume smoking again. - Continue Wellbutrin  150 mg twice daily

## 2024-03-20 NOTE — Assessment & Plan Note (Signed)
 Since she has stopped smoking her COPD has been much better controlled although she has been unable to afford her triple therapy with the changes in her insurance.  She rarely has to use her albuterol  inhaler.  She does see some room for improvement still and she should be back on triple therapy. - Trelegy samples given today, we will continue triple therapy at lowest cost to the patient depending on insurance

## 2024-03-20 NOTE — Assessment & Plan Note (Signed)
 Patient has a history of anxiety and depression that has been managed adequately with Wellbutrin  and Celexa  however her husband over 40 years recently passed away and she is currently going through the grief process.  She denies any suicidal ideation or passive death wish.  She continues to be adherent to her medications.  She has access to group counseling through palliative care although she has not started this yet.  She did not want to fill out the full PHQ-9 or GAD-7 today.  Overall she is dealing with her grief and and apparently healthy way so far and has good family support as well as access to group therapy/grief counseling.  She would not like to change her medications today and knows to contact us , her family, or go seek immediate medical attention for any suicidal thoughts. - Continue Wellbutrin  150 mg twice daily and Celexa  40 mg daily - Return in 3 weeks for reevaluation, encouraged to consider attending group grief counseling in the interim

## 2024-03-20 NOTE — Assessment & Plan Note (Signed)
 A1c today 11.1 improved from over 14 from 11/2023.  She has been taking insulin  glargine 30 units daily and infrequently checking her blood sugars.  She has been intolerant to metformin  due to GI side effects, SGLT2 inhibitors due to her current GU mycotic infections, and was previously using CGM but lost Medicaid due to the change in her family's income.  She is now reapplying for Medicaid and is hopeful to qualify which could open up GLP-1 agonist medications that have been financially prohibitive.  She reports that her morning blood sugars when she does check it is close to 250.  She is agreeable to try CGM again when she has insurance. - Increase insulin  glargine from 30 units daily to 36 units daily - When able to get health insurance we will start GLP-1 agonist and resume CGM - Return in 3 weeks to review glucometer with at least 2 blood sugar checks a day

## 2024-03-20 NOTE — Assessment & Plan Note (Signed)
 Blood pressure remains well-controlled at 121/85.  BMP today with mild hyponatremia but otherwise stable electrolytes and kidney function. - Continue lisinopril  5 mg daily

## 2024-03-24 NOTE — Progress Notes (Signed)
 Internal Medicine Clinic Attending  Case discussed with the resident at the time of the visit.  We reviewed the resident's history and exam and pertinent patient test results.  I agree with the assessment, diagnosis, and plan of care documented in the resident's note.

## 2024-03-31 ENCOUNTER — Other Ambulatory Visit: Payer: Self-pay | Admitting: Student

## 2024-03-31 ENCOUNTER — Encounter: Payer: Self-pay | Admitting: Hematology and Oncology

## 2024-03-31 ENCOUNTER — Other Ambulatory Visit: Payer: Self-pay

## 2024-03-31 DIAGNOSIS — J449 Chronic obstructive pulmonary disease, unspecified: Secondary | ICD-10-CM

## 2024-04-03 ENCOUNTER — Other Ambulatory Visit: Payer: Self-pay

## 2024-04-03 ENCOUNTER — Encounter: Payer: Self-pay | Admitting: Hematology and Oncology

## 2024-04-03 MED ORDER — ALBUTEROL SULFATE HFA 108 (90 BASE) MCG/ACT IN AERS
INHALATION_SPRAY | RESPIRATORY_TRACT | 0 refills | Status: DC
  Filled 2024-04-03: qty 6.7, 30d supply, fill #0

## 2024-04-07 ENCOUNTER — Encounter: Payer: Self-pay | Admitting: Hematology and Oncology

## 2024-04-07 ENCOUNTER — Other Ambulatory Visit: Payer: Self-pay

## 2024-04-08 ENCOUNTER — Ambulatory Visit: Admitting: Student

## 2024-04-08 NOTE — Progress Notes (Deleted)
 Diabetes mellitus, type II, insulin  dependent (HCC) A1c today 11.1 improved from over 14 from 11/2023.  She has been taking insulin  glargine 30 units daily and infrequently checking her blood sugars.  She has been intolerant to metformin  due to GI side effects, SGLT2 inhibitors due to her current GU mycotic infections, and was previously using CGM but lost Medicaid due to the change in her family's income.  She is now reapplying for Medicaid and is hopeful to qualify which could open up GLP-1 agonist medications that have been financially prohibitive.  She reports that her morning blood sugars when she does check it is close to 250.  She is agreeable to try CGM again when she has insurance. - Increase insulin  glargine from 30 units daily to 36 units daily - When able to get health insurance we will start GLP-1 agonist and resume CGM - Return in 3 weeks to review glucometer with at least 2 blood sugar checks a day Essential hypertension, benign Blood pressure remains well-controlled at 121/85.  BMP today with mild hyponatremia but otherwise stable electrolytes and kidney function. - Continue lisinopril  5 mg daily Encounter for immunization Received annual flu vaccine today. Tobacco dependence Despite the emotional strain of losing her husband recently she has only relapsed once smoking part of a cigarette but had no enjoyment of it as she used to and does not have any desire to resume smoking again. - Continue Wellbutrin  150 mg twice daily Anxiety and depression Patient has a history of anxiety and depression that has been managed adequately with Wellbutrin  and Celexa  however her husband over 40 years recently passed away and she is currently going through the grief process.  She denies any suicidal ideation or passive death wish.  She continues to be adherent to her medications.  She has access to group counseling through palliative care although she has not started this yet.  She did not want to fill  out the full PHQ-9 or GAD-7 today.  Overall she is dealing with her grief and and apparently healthy way so far and has good family support as well as access to group therapy/grief counseling.  She would not like to change her medications today and knows to contact us , her family, or go seek immediate medical attention for any suicidal thoughts. - Continue Wellbutrin  150 mg twice daily and Celexa  40 mg daily - Return in 3 weeks for reevaluation, encouraged to consider attending group grief counseling in the interim Chronic obstructive pulmonary disease, unspecified COPD type (HCC) Since she has stopped smoking her COPD has been much better controlled although she has been unable to afford her triple therapy with the changes in her insurance.  She rarely has to use her albuterol  inhaler.  She does see some room for improvement still and she should be back on triple therapy. - Trelegy samples given today, we will continue triple therapy at lowest cost to the patient depending on insurance    CC: Routine Follow Up for uncontrolled diabetes after last office visit 03/18/2024  HPI:  Vanessa [name] is a 63 y.o. female with pertinent PMH of T2DM on insulin , HTN, severe PAD s/p right axillary femoral bypass, and COPD  who presents as above. Please see assessment and plan below for further details.  Medications: Current Outpatient Medications  Medication Instructions   acetaminophen  (TYLENOL ) 1,000 mg, 2 times daily PRN   albuterol  (VENTOLIN  HFA) 108 (90 Base) MCG/ACT inhaler use as directed   aspirin  EC 81 mg, Oral, Daily,  Swallow whole.   atorvastatin  (LIPITOR) 80 mg, Oral, Daily   Basaglar  KwikPen 36 Units, Subcutaneous, Daily   buPROPion  (WELLBUTRIN  SR) 150 mg, Oral, 2 times daily   cetirizine  (ZYRTEC ) 10 mg, Daily PRN   cimetidine  (TAGAMET ) 200 mg, 2 times daily   citalopram  (CELEXA ) 40 mg, Oral, Daily   clopidogrel  (PLAVIX ) 75 mg, Oral, Daily   Continuous Glucose Receiver (DEXCOM G7 RECEIVER)  DEVI Use with Dexcom G7 sensors to monitor your glucose continuously   Continuous Glucose Sensor (FREESTYLE LIBRE 3 PLUS SENSOR) MISC Change sensor every 15 days.   ezetimibe  (ZETIA ) 10 mg, Oral, Daily   Fluticasone -Umeclidin-Vilant (TRELEGY ELLIPTA ) 200-62.5-25 MCG/ACT AEPB 1 puff, Inhalation, Daily   gabapentin  (NEURONTIN ) 300 mg, Oral, Daily at bedtime   glucose blood test strip Use as instructed   ibuprofen  (ADVIL ) 600 mg, Every 6 hours PRN   Insulin  Pen Needle (PEN NEEDLES) 31G X 5 MM MISC 1 each, Does not apply, Daily, Use to inject insulin    lisinopril  (ZESTRIL ) 5 mg, Oral, Daily   tiotropium (SPIRIVA ) 18 mcg, Daily     Review of Systems:   Pertinent items noted in HPI and/or A&P.  Physical Exam:  There were no vitals filed for this visit.  Constitutional:***. In no acute distress. HEENT: Normocephalic, atraumatic, Sclera non-icteric, PERRL, EOM intact Cardio:Regular rate and rhythm. 2+ bilateral {PulseLoc:28294} pulses. Pulm:Clear to auscultation bilaterally. Normal work of breathing on room air. Abdomen: Soft, non-tender, non-distended, positive bowel sounds. FDX:Wzhjupcz for extremity edema. Skin:Warm and dry. Neuro:Alert and oriented x3. No focal deficit noted. Psych:Pleasant mood and affect.   Assessment & Plan:   Assessment & Plan   No orders of the defined types were placed in this encounter.    No follow-ups on file.   Patient {GC/GE:3044014::discussed with,seen with} {JGIMTSattending2025/2026:32954}  Fairy Pool, DO [facility] Internal [facility] Resident PGY-3 Clinic Phone: [phone] Please contact the on call pager at [phone] for any urgent or emergent needs.

## 2024-05-12 ENCOUNTER — Other Ambulatory Visit: Payer: Self-pay

## 2024-05-12 ENCOUNTER — Encounter: Payer: Self-pay | Admitting: Hematology and Oncology

## 2024-05-12 ENCOUNTER — Other Ambulatory Visit: Payer: Self-pay | Admitting: Student

## 2024-05-12 DIAGNOSIS — J449 Chronic obstructive pulmonary disease, unspecified: Secondary | ICD-10-CM

## 2024-05-13 ENCOUNTER — Encounter: Payer: Self-pay | Admitting: Hematology and Oncology

## 2024-05-13 ENCOUNTER — Other Ambulatory Visit: Payer: Self-pay

## 2024-05-13 MED ORDER — ALBUTEROL SULFATE HFA 108 (90 BASE) MCG/ACT IN AERS
INHALATION_SPRAY | RESPIRATORY_TRACT | 0 refills | Status: DC
  Filled 2024-05-13: qty 6.7, 25d supply, fill #0

## 2024-05-21 ENCOUNTER — Other Ambulatory Visit: Payer: Self-pay

## 2024-05-23 ENCOUNTER — Other Ambulatory Visit: Payer: Self-pay

## 2024-05-29 ENCOUNTER — Encounter: Payer: Self-pay | Admitting: Hematology and Oncology

## 2024-06-02 ENCOUNTER — Other Ambulatory Visit: Payer: Self-pay

## 2024-06-02 ENCOUNTER — Encounter: Payer: Self-pay | Admitting: Hematology and Oncology

## 2024-06-02 ENCOUNTER — Ambulatory Visit: Payer: Self-pay

## 2024-06-02 VITALS — BP 157/83 | HR 105 | Temp 98.0°F | Ht 62.0 in | Wt 156.6 lb

## 2024-06-02 DIAGNOSIS — Z9889 Other specified postprocedural states: Secondary | ICD-10-CM

## 2024-06-02 DIAGNOSIS — I2089 Other forms of angina pectoris: Secondary | ICD-10-CM

## 2024-06-02 DIAGNOSIS — F17200 Nicotine dependence, unspecified, uncomplicated: Secondary | ICD-10-CM

## 2024-06-02 DIAGNOSIS — K219 Gastro-esophageal reflux disease without esophagitis: Secondary | ICD-10-CM

## 2024-06-02 DIAGNOSIS — J449 Chronic obstructive pulmonary disease, unspecified: Secondary | ICD-10-CM

## 2024-06-02 MED ORDER — ALBUTEROL SULFATE HFA 108 (90 BASE) MCG/ACT IN AERS
1.0000 | INHALATION_SPRAY | Freq: Four times a day (QID) | RESPIRATORY_TRACT | 6 refills | Status: DC | PRN
  Filled 2024-06-02 (×2): qty 6.7, 34d supply, fill #0
  Filled 2024-07-04 (×2): qty 6.7, 34d supply, fill #1

## 2024-06-02 MED ORDER — CLOPIDOGREL BISULFATE 75 MG PO TABS
75.0000 mg | ORAL_TABLET | Freq: Every day | ORAL | 3 refills | Status: AC
  Filled 2024-06-02 – 2024-07-18 (×3): qty 90, 90d supply, fill #0

## 2024-06-02 MED ORDER — PANTOPRAZOLE SODIUM 20 MG PO TBEC
20.0000 mg | DELAYED_RELEASE_TABLET | Freq: Every day | ORAL | 1 refills | Status: DC
  Filled 2024-06-02: qty 30, 30d supply, fill #0

## 2024-06-02 MED ORDER — BUPROPION HCL ER (SR) 150 MG PO TB12
150.0000 mg | ORAL_TABLET | Freq: Two times a day (BID) | ORAL | 3 refills | Status: AC
  Filled 2024-06-02 – 2024-07-18 (×3): qty 60, 30d supply, fill #0

## 2024-06-02 NOTE — Patient Instructions (Signed)
 Thank you, Vanessa [name] for allowing us  to provide your care today. Today we discussed the following:  - If you begin to experience worsening chest pain or shortness of breath, then please go to the ED - I have resent Wellbutrin  for you. Please take 1 tablet daily for 3 days, and then begin taking 1 tablet twice daily thereafter every day - We will trial Protonix  20 mg daily for your GERD to take instead of Cimetidine     Referrals ordered today:   Referral Orders         Ambulatory referral to Cardiology      I have ordered the following medication/changed the following medications:   Start the following medications: Meds ordered this encounter  Medications   albuterol  (VENTOLIN  HFA) 108 (90 Base) MCG/ACT inhaler    Sig: use as directed    Dispense:  6.7 g    Refill:  6   buPROPion  (WELLBUTRIN  SR) 150 MG 12 hr tablet    Sig: Take 1 tablet (150 mg total) by mouth 2 (two) times daily.    Dispense:  60 tablet    Refill:  3   clopidogrel  (PLAVIX ) 75 MG tablet    Sig: Take 1 tablet (75 mg total) by mouth daily.    Dispense:  90 tablet    Refill:  3   pantoprazole  (PROTONIX ) 20 MG tablet    Sig: Take 1 tablet (20 mg total) by mouth daily.    Dispense:  30 tablet    Refill:  1     Follow up: 1 month     Should you have any questions or concerns please call the Internal Medicine Clinic at [phone].     Doyal Miyamoto, MD [facility]

## 2024-06-02 NOTE — Progress Notes (Unsigned)
 Patient name: Vanessa [name] Date of birth: [birth year] Date of visit: 06/02/24  Type of visit: Acute Office Visit   Subjective   Chief concern:  Chief Complaint  Patient presents with   Follow-up    Routine office visit for reflux- have like  burning pain going to left ear / requesting referral to cardiology (Dr. Pete Jordan) . To discuss something for nerves. PHQ9 done.    Vanessa [name] is a 63 y.o. female with a history of HTN, GERD, DMII, ovarian cancer, axillary femoral bypass 11/2022 showed LVEF 50-55% with no diastolic dysfunction,  who presents to [facility] clinic {imcrsn:[id number]}  Lost her husband 02/2024 as well as her 2 dogs   - has had some burning mid-sternal chest pain since 01/2024; mostly will experience the pain with stress or eating, but occasionally will experience when doing a lot of strenuous work; Dr. Peter Jordan  - her late husband had 3 tabs of nitroglycerin left which alleviated her symptoms - she spoke with Dr. Gib staff on Friday which prompted her to schedule an appointment today to request a referral   - Citalopram  40 mg daily - Wellbutrin  150 mg BID; but has not been taking this consistently   - refilled Plavix  for 2 aortic bypasses   - return to clinic January 2026 for diabetes, HTN,  - she did not take her medications today   ***ROS: Denies headaches, dizziness, fever, chills, runny nose, sore throat, vision changes, hearing changes, chest pain, shortness of breath, difficulty breathing, nausea, vomiting, abdominal pain. Denies increased urinary frequency, pain with urination, constipation or diarrhea. No recent falls.   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) 11/22/2022    Peripheral arterial disease with history of revascularization 05/31/2022   Genetic testing 01/02/2022   Ovarian cancer (HCC) 12/02/2021   Seasonal allergies 09/23/2021   Pelvic mass 05/12/2020   Elevated CEA 05/12/2020   Polycythemia, 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   Umbilical hernia without obstruction and without gangrene 08/09/2017   Essential hypertension, benign 03/11/2014   Hepatic steatosis 05/30/2013   Irritable bowel syndrome (IBS) 01/09/2013   Healthcare maintenance 01/09/2013   Baker's cyst of knee 03/20/2011   COPD (chronic obstructive pulmonary disease) (HCC) 12/01/2010   Anxiety and depression 07/23/2008   Tobacco dependence 10/23/2007   HLD (hyperlipidemia) 06/14/2006   ASTHMA 06/14/2006   GERD 06/14/2006   Diabetes mellitus, type II, insulin  dependent (HCC) 06/26/2002     Past Surgical History:  Procedure Laterality Date   ABDOMINAL AORTOGRAM W/LOWER EXTREMITY N/A 11/24/2022   Procedure: ABDOMINAL AORTOGRAM W/LOWER EXTREMITY;  Surgeon: Magda Debby SAILOR, MD;  Location: [facility];  Service: Cardiovascular;  Laterality: N/A;   ABDOMINAL AORTOGRAM W/LOWER EXTREMITY N/A 08/07/2023   Procedure: ABDOMINAL AORTOGRAM W/LOWER EXTREMITY;  Surgeon: Serene Gaile ORN, MD;  Location: [facility];  Service: Cardiovascular;  Laterality: N/A;   AMPUTATION Right 12/02/2022   Procedure: RAY AMPUTATION OF  RIGHT 5TH & 4TH TOES;  Surgeon: Malvin Marsa FALCON, DPM;  Location: [facility] OR;  Service: Podiatry;  Laterality: Right;   APPLICATION OF WOUND VAC Right 11/29/2022   Procedure: APPLICATION OF WOUND VAC AND KERECIS MICRO WOUND GRAFT;  Surgeon: Serene Gaile ORN, MD;  Location:  [facility] OR;  Service: Vascular;  Laterality: Right;   AXILLARY-FEMORAL BYPASS GRAFT Right 11/29/2022   Procedure: RIGHT AXILLA ARTERY - RIGHT FEMORAL ARTERY BYPASS GRAFT USING 8mm X 70cm PROPATEN GORE GRAFT WITH REMOVABLE RINGS;  Surgeon: Serene Gaile ORN, MD;  Location: [facility];  Service: Vascular;  Laterality: Right;    CESAREAN SECTION     with 3 children   COLONOSCOPY  2018   ENDARTERECTOMY FEMORAL Left 08/22/2023   Procedure: LEFT ENDARTERECTOMY FEMORAL;  Surgeon: Serene Gaile ORN, MD;  Location: [facility];  Service: Vascular;  Laterality: Left;   FEMORAL-POPLITEAL BYPASS GRAFT Left 08/22/2023   Procedure: LEFT BYPASS GRAFT FEMORAL-POPLITEAL ARTERY;  Surgeon: Serene Gaile ORN, MD;  Location: [facility];  Service: Vascular;  Laterality: Left;   INSERTION OF ILIAC STENT Left 08/22/2023   Procedure: INSERTION OF ILIAC STENT - LEFT;  Surgeon: Serene Gaile ORN, MD;  Location: [facility];  Service: Vascular;  Laterality: Left;   OVARIAN CYST REMOVAL     Age 69   PERIPHERAL VASCULAR INTERVENTION Left 08/07/2023   Procedure: PERIPHERAL VASCULAR INTERVENTION;  Surgeon: Serene Gaile ORN, MD;  Location: [facility];  Service: Cardiovascular;  Laterality: Left;  Iliac   ROBOTIC ASSISTED BILATERAL SALPINGO OOPHERECTOMY Bilateral 11/23/2021   Procedure: XI ROBOTIC ASSISTED BILATERAL SALPINGO OOPHORECTOMY,  MINI LAPAROTOMY;  Surgeon: Viktoria Comer SAUNDERS, MD;  Location: [facility];  Service: Gynecology;  Laterality: Bilateral;   ROBOTIC ASSISTED TOTAL HYSTERECTOMY N/A 11/23/2021   Procedure: XI ROBOTIC ASSISTED TOTAL HYSTERECTOMY, STAGING, CYSTOSCOPY;  Surgeon: Viktoria Comer SAUNDERS, MD;  Location: [facility];  Service: Gynecology;  Laterality: N/A;   TUBAL LIGATION     UPPER GASTROINTESTINAL ENDOSCOPY  2018     Current Outpatient Medications  Medication Instructions   acetaminophen  (TYLENOL ) 1,000 mg, 2 times daily PRN   albuterol  (VENTOLIN  HFA) 108 (90 Base) MCG/ACT inhaler use as directed   aspirin  EC 81 mg, Oral, Daily, Swallow whole.   atorvastatin  (LIPITOR) 80 mg, Oral, Daily   Basaglar  KwikPen 36 Units, Subcutaneous, Daily   buPROPion  (WELLBUTRIN  SR) 150 mg, Oral, 2 times daily   cetirizine  (ZYRTEC ) 10 mg, Daily PRN   cimetidine  (TAGAMET ) 200 mg, 2 times daily   citalopram  (CELEXA ) 40 mg, Oral, Daily   clopidogrel  (PLAVIX ) 75  mg, Oral, Daily   Continuous Glucose Receiver (DEXCOM G7 RECEIVER) DEVI Use with Dexcom G7 sensors to monitor your glucose continuously   Continuous Glucose Sensor (FREESTYLE LIBRE 3 PLUS SENSOR) MISC Change sensor every 15 days.   ezetimibe  (ZETIA ) 10 mg, Oral, Daily   Fluticasone -Umeclidin-Vilant (TRELEGY ELLIPTA ) 200-62.5-25 MCG/ACT AEPB 1 puff, Inhalation, Daily   gabapentin  (NEURONTIN ) 300 mg, Oral, Daily at bedtime   glucose blood test strip Use as instructed   ibuprofen  (ADVIL ) 600 mg, Every 6 hours PRN   Insulin  Pen Needle (PEN NEEDLES) 31G X 5 MM MISC 1 each, Does not apply, Daily, Use to inject insulin    lisinopril  (ZESTRIL ) 5 mg, Oral, Daily   tiotropium (SPIRIVA ) 18 mcg, Daily    Social History   Tobacco Use   Smoking status: Former    Current packs/day: 1.00    Average packs/day: 1 pack/day for 43.0 years (43.0 ttl pk-yrs)    Types: Cigarettes   Smokeless tobacco: Never   Tobacco comments:    Wants Wellbutrin    Vaping Use   Vaping status: Every Day   Substances: Nicotine   Substance Use Topics   Alcohol use: No    Alcohol/week: 0.0 standard drinks of alcohol  Comment: none   Drug use: No      Objective  Today's Vitals   06/02/24 1314 06/02/24 1331  BP: (!) 173/79 (!) 157/83  Pulse: (!) 101 (!) 105  Temp: 98 F (36.7 C)   TempSrc: Oral   SpO2: 91%   Weight: 156 lb 9.6 oz (71 kg)   Height: 5' 2 (1.575 m)   PainSc: 8  0-No pain  PainLoc: Chest   Body mass index is 28.64 kg/m.   Physical Exam:   Constitutional: well-appearing *** sitting in exam chair, in no acute distress. Ambulates without use of assistance device *** HEENT: normocephalic atraumatic, mucous membranes moist Eyes: conjunctiva non-erythematous Neck: supple Cardiovascular: regular rate and rhythm, bilateral radial pulses 2+, bilateral dorsal pedal pulses 2+, brisk capillary refill bilateral feet and hands  Pulmonary/Chest: normal work of breathing on room air, lungs clear to  auscultation bilaterally Abdominal: soft, non-tender, non-distended MSK: normal bulk and tone. Neurological: alert & oriented x 3, sensation intact bilateral feet to monofilament*** Skin: warm and dry, no ulcers or lesions on bilateral feet*** Psych: mood calm, behavior normal, thought content normal, judgement normal    {Labs (Optional):23779}  The 10-year ASCVD risk score (Arnett DK, et al., 2019) is: 22.9%   Values used to calculate the score:     Age: 34 years     Clincally relevant sex: Female     Is Non-Hispanic African American: No     Diabetic: Yes     Tobacco smoker: No     Systolic Blood Pressure: 157 mmHg     Is BP treated: Yes     HDL Cholesterol: 25 mg/dL     Total Cholesterol: 181 mg/dL      Assessment & Plan  Problem List Items Addressed This Visit   None   No follow-ups on file.  Patient discussed with Dr. Lovie, who also saw and evaluated the patient.  Doyal Miyamoto, MD  IM  PGY-1 06/02/2024, 2:08 PM

## 2024-06-04 DIAGNOSIS — I2089 Other forms of angina pectoris: Secondary | ICD-10-CM | POA: Insufficient documentation

## 2024-06-04 NOTE — Assessment & Plan Note (Signed)
 Patient has a history of HFmrEF Echo 11/2022 showed LVEF 50-55% with no diastolic dysfunction.  She also has a history of GERD, though she presents to clinic today with concerns of symptoms consistent with atypical chest pain including burning midsternal chest pain that began in 01/2024.  She mainly notes this pain with stress or eating, though will occasionally experience it when she is doing strenuous work.  She does admit that her late husband had 3 tablets of nitroglycerin left, which she took on 3 different occasions when she was experiencing the chest pain and this helped resolve the pain immediately.  She spoke with her late husband's cardiologist Dr. Gib staff on Friday regarding an appointment with them and they requested that we send a referral, which I think is definitely reasonable given her atypical chest pain symptoms, risk factors and ASCVD risk score of 22.9%.  She is given strict return precautions to go to the emergency department if the chest pain worsened, she developed shortness of breath, sweating, or nausea and vomiting with it. - referral to Cardiology Dr. Jordan  - Continue Lisinopril  5 mg daily

## 2024-06-04 NOTE — Assessment & Plan Note (Signed)
 Blood pressure has been previously well-controlled on lisinopril  5 mg daily.  Today in the office blood pressure elevated to 173/79 and 157/83 on repeat.  She states that she did not take any of her medications this morning.  She presents to clinic today for request of cardiology referral, so we did not delve further into other chronic conditions.  I have asked that she return to clinic in 1 month for follow-up regarding diabetes and hypertension. - Continue Lisinopril  5 mg daily - f/u in 1 month for BP recheck and diabetes

## 2024-06-04 NOTE — Assessment & Plan Note (Addendum)
 Patient has a history of HFmrEF Echo 11/2022 showed LVEF 50-55% with no diastolic dysfunction, though never followed with Cardiology after diagnosis. She presents to clinic today with symptoms concerning for atypical chest pain. Given this and her risk factors will plan to refer her to Cardiology.  - referral to Cardiology Dr. Jordan  - Continue Lisinopril  5 mg daily

## 2024-06-04 NOTE — Assessment & Plan Note (Signed)
 Patient has a history of axillary femoral bypass completed in 11/2022.  She takes aspirin  81 mg daily, Plavix  75 mg daily, atorvastatin  80 mg daily, and ezetimibe  10 mg daily.  She is requesting refill of Plavix  which I have sent to her pharmacy.  She will continue to follow-up with [facility].  - Refilled Plavix  75 mg daily - Continue Aspirin  81 mg daily- - Continue Atorvastatin  80 mg daily - Continue Zetia  10 mg daily

## 2024-06-04 NOTE — Assessment & Plan Note (Signed)
 Patient has a history of GERD managed with pantoprazole  20 mg daily.  I have sent a refill of this medication. - Continue Pantoprazole  20 mg daily

## 2024-06-04 NOTE — Assessment & Plan Note (Signed)
 Patient has a history of anxiety and depression, and also recently lost her husband and 2 dogs in 02/2024.  She is dealing with her grief in a healthy way, and reports good support with her family.  She continues to use citalopram  40 mg daily, and is supposed to be taking Wellbutrin  150 mg twice daily, the reports that she has not been taking Wellbutrin  consistently.  I discussed with her the importance of taking this medication consistently, and if her symptoms of anxiety are still not well-controlled then we can make adjustments but this is hard to determine if she is taking this medication very sporadically.  I have asked her to resume taking this twice daily as prescribed, and recheck mood when she returns. - Continue Wellbutrin  150 mg BID - Continue Citalopram  40 mg daily

## 2024-06-05 NOTE — Progress Notes (Signed)
Internal Medicine Clinic Attending  I was physically present during the key portions of the resident provided service and participated in the medical decision making of patient's management care. I reviewed pertinent patient test results.  The assessment, diagnosis, and plan were formulated together and I agree with the documentation in the resident's note.  Mercie Eon, MD

## 2024-06-11 ENCOUNTER — Other Ambulatory Visit: Payer: Self-pay

## 2024-06-12 ENCOUNTER — Other Ambulatory Visit: Payer: Self-pay

## 2024-06-25 NOTE — Progress Notes (Signed)
 " Cardiology Office Note:    Date:  07/01/2024   ID:  Vanessa [name], DOB [birth year], MRN [id number]  PCP:  Jolaine Pac, DO   [facility] Providers Cardiologist:  None     Referring MD: Karna Fellows, MD   Chief Complaint  Patient presents with   Chest Pain    History of Present Illness:    Vanessa [name] is a 63 y.o. female seen at the request of Dr Fellows for evaluation of chest pain. She has a history of DM, COPD, HLD, HTN, and PAD. She has a history of tobacco abuse. She is s/p axillofemoral bypass on the right and left iliac stenting with left fem-pop bypass. Echo done in June 2024 was normal. No prior ischemic evaluation.   She reports that her husband passed in September. Since that time she has experienced a lot of chest pain. This is a severe burning sensation in her central chest radiating the the left chest. Also will radiate to the left shoulder and arm, left neck and left ear. No relief with antacids but did take one of her husbands sl Ntg with relief. She reports she quit smoking in Feb but still vapes. Sugar has been poorly controlled with A1c 11.1%. she reports he claudication symptoms are doing very well post revascularization.   Past Medical History:  Diagnosis Date   Asthma    No PFT performed   Blood transfusion without reported diagnosis    has to donate blood due to having thick blood.   Complication of anesthesia    Woken up afterwards with panic attacks   COPD (chronic obstructive pulmonary disease) (HCC)    Depression    Diabetes mellitus 2002   Dyspnea    with exertion   GERD (gastroesophageal reflux disease)    Helicobacter pylori (H. pylori) infection    s/p triple therapy   Hepatic hemangioma    History of heart murmur in childhood    History of kidney stones    Hyperlipidemia    Hypertension    Hypertriglyceridemia    Panic attacks    mostly Agaraphobia   Peripheral arterial disease    Peripheral vascular disease    Pneumonia     Ventral hernia     Past Surgical History:  Procedure Laterality Date   ABDOMINAL AORTOGRAM W/LOWER EXTREMITY N/A 11/24/2022   Procedure: ABDOMINAL AORTOGRAM W/LOWER EXTREMITY;  Surgeon: Magda Debby SAILOR, MD;  [facility];  Service: Cardiovascular;  Laterality: N/A;   ABDOMINAL AORTOGRAM W/LOWER EXTREMITY N/A 08/07/2023   Procedure: ABDOMINAL AORTOGRAM W/LOWER EXTREMITY;  Surgeon: Serene Gaile ORN, MD;  [facility];  Service: Cardiovascular;  Laterality: N/A;   AMPUTATION Right 12/02/2022   Procedure: RAY AMPUTATION OF  RIGHT 5TH & 4TH TOES;  Surgeon: Malvin Marsa FALCON, DPM;  Location: [facility];  Service: Podiatry;  Laterality: Right;   APPLICATION OF WOUND VAC Right 11/29/2022   Procedure: APPLICATION OF WOUND VAC AND KERECIS MICRO WOUND GRAFT;  Surgeon: Serene Gaile ORN, MD;  [facility]: [facility];  Service: Vascular;  Laterality: Right;   AXILLARY-FEMORAL BYPASS GRAFT Right 11/29/2022   Procedure: RIGHT AXILLA ARTERY - RIGHT FEMORAL ARTERY BYPASS GRAFT USING 8mm X 70cm PROPATEN GORE GRAFT WITH REMOVABLE RINGS;  Surgeon: Serene Gaile ORN, MD;  [facility];  Service: Vascular;  Laterality: Right;   CESAREAN SECTION     with 3 children   COLONOSCOPY  2018   ENDARTERECTOMY FEMORAL Left 08/22/2023  Procedure: LEFT ENDARTERECTOMY FEMORAL;  Surgeon: Serene Gaile ORN, MD;  Location: [facility];  Service: Vascular;  Laterality: Left;   FEMORAL-POPLITEAL BYPASS GRAFT Left 08/22/2023   Procedure: LEFT BYPASS GRAFT FEMORAL-POPLITEAL ARTERY;  Surgeon: Serene Gaile ORN, MD;  [facility];  Service: Vascular;  Laterality: Left;   INSERTION OF ILIAC STENT Left 08/22/2023   Procedure: INSERTION OF ILIAC STENT - LEFT;  Surgeon: Serene Gaile ORN, MD;  [facility];  Service: Vascular;  Laterality: Left;   OVARIAN CYST REMOVAL     Age 85   PERIPHERAL VASCULAR INTERVENTION Left 08/07/2023   Procedure: PERIPHERAL VASCULAR INTERVENTION;  Surgeon: Serene Gaile ORN, MD;  [facility];  Service: Cardiovascular;  Laterality: Left;  Iliac   ROBOTIC ASSISTED BILATERAL SALPINGO OOPHERECTOMY Bilateral 11/23/2021   Procedure: XI ROBOTIC ASSISTED BILATERAL SALPINGO OOPHORECTOMY,  MINI LAPAROTOMY;  Surgeon: Viktoria Comer SAUNDERS, MD;  Location: [facility];  Service: Gynecology;  Laterality: Bilateral;   ROBOTIC ASSISTED TOTAL HYSTERECTOMY N/A 11/23/2021   Procedure: XI ROBOTIC ASSISTED TOTAL HYSTERECTOMY, STAGING, CYSTOSCOPY;  Surgeon: Viktoria Comer SAUNDERS, MD;  Location: [facility];  Service: Gynecology;  Laterality: N/A;   TUBAL LIGATION     UPPER GASTROINTESTINAL ENDOSCOPY  2018    Current Medications: Active Medications[1]   Allergies:   Jardiance  [empagliflozin ], Latex, and Avandia [rosiglitazone]   Social History   Socioeconomic History   Marital status: Married    Spouse name: Not on file   Number of children: Not on file   Years of education: Not on file   Highest education level: Not on file  Occupational History   Not on file  Tobacco Use   Smoking status: Former    Current packs/day: 1.00    Average packs/day: 1 pack/day for 43.0 years (43.0 ttl pk-yrs)    Types: Cigarettes   Smokeless tobacco: Never   Tobacco comments:    Wants Wellbutrin      Vapes currently  Vaping Use   Vaping status: Every Day   Substances: Nicotine   Substance and Sexual Activity   Alcohol use: No    Alcohol/week: 0.0 standard drinks of alcohol    Comment: none   Drug use: No   Sexual activity: Not Currently  Other Topics Concern   Not on file  Social History Narrative   Drinks at least a pot of coffee every day.    Social Drivers of Health   Tobacco Use: Medium Risk (07/01/2024)   Patient History    Smoking Tobacco Use: Former    Smokeless Tobacco Use: Never    Passive Exposure: Not on file  Financial Resource Strain: Not on file  Food Insecurity: No Food Insecurity (08/22/2023)   Hunger Vital Sign    Worried About Running Out of Food in the Last Year: Never true     Ran Out of Food in the Last Year: Never true  Transportation Needs: No Transportation Needs (08/22/2023)   PRAPARE - Administrator, Civil Service (Medical): No    Lack of Transportation (Non-Medical): No  Physical Activity: Not on file  Stress: Stress Concern Present (06/02/2024)   [employer] of Occupational Health - Occupational Stress Questionnaire    Feeling of Stress: Very much  Social Connections: Socially Isolated (06/02/2024)   Social Connection and Isolation Panel    Frequency of Communication with Friends and Family: More than three times a week    Frequency of Social Gatherings with Friends and Family: Twice a week  Attends Religious Services: Never    Active Member of Clubs or Organizations: No    Attends Banker Meetings: Never    Marital Status: Widowed  Depression (PHQ2-9): Medium Risk (06/02/2024)   Depression (PHQ2-9)    PHQ-2 Score: 10  Alcohol Screen: Low Risk (06/02/2024)   Alcohol Screen    Last Alcohol Screening Score (AUDIT): 0  Housing: Low Risk (08/22/2023)   Housing Stability Vital Sign    Unable to Pay for Housing in the Last Year: No    Number of Times Moved in the Last Year: 0    Homeless in the Last Year: No  Utilities: Not At Risk (08/22/2023)   AHC Utilities    Threatened with loss of utilities: No  Health Literacy: Adequate Health Literacy (06/02/2024)   B1300 Health Literacy    Frequency of need for help with medical instructions: Never     Family History: The patient's family history includes Breast cancer in her maternal aunt; Colon polyps in her mother; Diabetes in her maternal uncle; Heart attack in her brother; Ovarian cancer (age of onset: 71) in her mother. There is no history of Colon cancer, Stomach cancer, Endometrial cancer, Prostate cancer, Pancreatic cancer, Esophageal cancer, or Rectal cancer.  ROS:   Please see the history of present illness.     All other systems reviewed and are  negative.  EKGs/Labs/Other Studies Reviewed:    The following studies were reviewed today:  EKG Interpretation Date/Time:  Tuesday July 01 2024 09:40:02 EST Ventricular Rate:  103 PR Interval:  154 QRS Duration:  82 QT Interval:  348 QTC Calculation: 455 R Axis:   86  Text Interpretation: Sinus tachycardia Septal infarct (cited on or before 23-Aug-2023) When compared with ECG of 23-Aug-2023 17:03, Questionable change in initial forces of Anterior leads Confirmed by Bessy Reaney [phone]) on 07/01/2024 9:44:36 AM   Echo 11/26/22: IMPRESSIONS     1. Left ventricular ejection fraction, by estimation, is 50 to 55%. The  left ventricle has low normal function. The left ventricle has no regional  wall motion abnormalities. Left ventricular diastolic parameters were  normal.   2. Right ventricular systolic function is normal. The right ventricular  size is normal. Tricuspid regurgitation signal is inadequate for assessing  PA pressure.   3. Right atrial size was mildly dilated.   4. The mitral valve is normal in structure. No evidence of mitral valve  regurgitation. No evidence of mitral stenosis.   5. The aortic valve was not well visualized. Aortic valve regurgitation  is not visualized. No aortic stenosis is present.   6. The inferior vena cava is normal in size with greater than 50%  respiratory variability, suggesting right atrial pressure of 3 mmHg.   EKG Interpretation Date/Time:  Tuesday July 01 2024 09:40:02 EST Ventricular Rate:  103 PR Interval:  154 QRS Duration:  82 QT Interval:  348 QTC Calculation: 455 R Axis:   86  Text Interpretation: Sinus tachycardia Septal infarct (cited on or before 23-Aug-2023) When compared with ECG of 23-Aug-2023 17:03, Questionable change in initial forces of Anterior leads Confirmed by Vicky Schleich [id number]) on 07/01/2024 9:44:36 AM    Recent Labs: 08/22/2023: ALT 12 08/23/2023: Hemoglobin 12.2; Platelets 318 03/18/2024: BUN 11;  Creatinine, Ser 0.62; Potassium 4.7; Sodium 133  Recent Lipid Panel    Component Value Date/Time   CHOL 181 08/23/2023 1158   CHOL 350 (H) 06/12/2023 1653   TRIG 329 (H) 08/23/2023 1158   HDL 25 (L)  08/23/2023 1158   HDL 26 (L) 06/12/2023 1653   CHOLHDL 7.2 08/23/2023 1158   VLDL 66 (H) 08/23/2023 1158   LDLCALC 90 08/23/2023 1158   LDLCALC Comment (A) 06/12/2023 1653   LDLDIRECT 158 (H) 12/03/2014 1542     Risk Assessment/Calculations:                Physical Exam:    VS:  BP 124/64 (BP Location: Left Arm, Cuff Size: Normal)   Pulse (!) 103   Ht 5' 2 (1.575 m)   Wt 153 lb 12.8 oz (69.8 kg)   LMP 12/24/2013   SpO2 93%   BMI 28.13 kg/m     Wt Readings from Last 3 Encounters:  07/01/24 153 lb 12.8 oz (69.8 kg)  06/02/24 156 lb 9.6 oz (71 kg)  03/18/24 158 lb 3.2 oz (71.8 kg)     GEN:  Well nourished, well developed in no acute distress HEENT: prominent xanthalasma bilateral NECK: No JVD; very loud right carotid bruit. LYMPHATICS: No lymphadenopathy CARDIAC: RRR, no murmurs, rubs, gallops, palpable right  axillary thrill.  RESPIRATORY:  Clear to auscultation without rales, wheezing or rhonchi  ABDOMEN: Soft, non-tender, non-distended MUSCULOSKELETAL:  No edema; No deformity  SKIN: Warm and dry NEUROLOGIC:  Alert and oriented x 3 PSYCHIATRIC:  Normal affect   ASSESSMENT:    1. Atherosclerosis of native artery of both lower extremities with intermittent claudication   2. Essential hypertension, benign   3. Heart failure with mildly reduced ejection fraction (HFmrEF) (HCC)   4. Atypical angina   5. Peripheral arterial disease with history of revascularization    PLAN:    In order of problems listed above:  Chest pain with classic anginal symptoms. Patient is a vasculopath with multiple risk factors. She is no DAPT. Will add Coreg  6.25 mg bid. Rx for sl Ntg prn. Recommend proceeding directly to cardiac cath given high risk. Would hopefully be able to use  left radial access. The procedure and risks were reviewed including but not limited to death, myocardial infarction, stroke, arrythmias, bleeding, transfusion, emergency surgery, dye allergy, or renal dysfunction. The patient voices understanding and is agreeable to proceed. HTN. Controlled HLD on high dose lipitor and Zetia . Will check lipid panel. If not at goal consider adding Repatha. DM  on insulin  with poor control. Per PCP Tobacco abuse. Congratulated on stopping cigarettes but needs to stop vaping as well PAD s/p right axillo femoral BPG, s/p left iliac stent and left fem-pop BPG. Followed by vascular surgery. Right carotid bruit. Will need carotid dopplers.       Informed Consent   Shared Decision Making/Informed Consent The risks [stroke (1 in 1000), death (1 in 1000), kidney failure [usually temporary] (1 in 500), bleeding (1 in 200), allergic reaction [possibly serious] (1 in 200)], benefits (diagnostic support and management of coronary artery disease) and alternatives of a cardiac catheterization were discussed in detail with Ms. Homer and she is willing to proceed.       Medication Adjustments/Labs and Tests Ordered: Current medicines are reviewed at length with the patient today.  Concerns regarding medicines are outlined above.  Orders Placed This Encounter  Procedures   Comp Met (CMET)   CBC w/Diff   Lipid panel   HgB A1c   EKG 12-Lead   Meds ordered this encounter  Medications   nitroGLYCERIN  (NITROSTAT ) 0.4 MG SL tablet    Sig: Place 1 tablet (0.4 mg total) under the tongue every 5 (five) minutes as needed  for chest pain.    Dispense:  75 tablet    Refill:  3   carvedilol  (COREG ) 6.25 MG tablet    Sig: Take 1 tablet (6.25 mg total) by mouth 2 (two) times daily.    Dispense:  180 tablet    Refill:  3    Patient Instructions  Medication Instructions:  Take NTG under tongue as needed for chest pain Start Coreg  6.25 mg twice a day Continue all other  medications *If you need a refill on your cardiac medications before your next appointment, please call your pharmacy*  Lab Work: Have cmet,cbc,lipid panel,A1c today  Testing/Procedures: Cardiac Cath scheduled at [facility] Monday 1/9   Arrive at 5:30 am    Follow instructions below  Follow-Up: At [facility], you and your health needs are our priority.  As part of our continuing mission to provide you with exceptional heart care, our providers are all part of one team.  This team includes your primary Cardiologist (physician) and Advanced Practice Providers or APPs (Physician Assistants and Nurse Practitioners) who all work together to provide you with the care you need, when you need it.  Your next appointment:  After Cath    Provider:  Dr.Tanna Loeffler           [place] [facility] A DEPT OF [place] AT [address] A DEPT OF THE [place] [address] [place] [address] Dept: [phone] Loc: [phone]  RUCHAMA KUBICEK  07/01/2024  You are scheduled for a Cardiac Catheterization on Monday, January 19 with Dr. Regenia Erck.  1. Please arrive at the [facility] (Main Entrance A) at [facility]: [address] [place], [place] [address] at 5:30 AM (This time is 2 hour(s) before your procedure to ensure your preparation).   Free valet parking service is available. You will check in at ADMITTING. The support person will be asked to wait in the waiting room.  It is OK to have someone drop you off and come back when you are ready to be discharged.    Special note: Every effort is made to have your procedure done on time. Please understand that emergencies sometimes delay scheduled procedures.  2. Diet: Nothing to eat after midnight.   3. Hydration: You need to be well hydrated before your procedure. On January 19, you may drink approved liquids (see below) until 2 hours before the procedure, with 16 oz of water  as your  last intake.   List of approved liquids water , clear juice, clear tea, black coffee, fruit juices, non-citric and without pulp, carbonated beverages, Gatorade, Kool -Aid, plain Jello-O and plain ice popsicles.  4. Labs: You will need to have blood drawn on today 1/6 [facility] 1st floor  5. Medication instructions in preparation for your procedure:      Take 1/2 dose of Insulin  night before Hold morning dose of Insulin      On the morning of your procedure, take your Aspirin  81 mg and Plavix /Clopidogrel  and any morning medicines NOT listed above.  You may use sips of water .  6. Plan to go home the same day, you will only stay overnight if medically necessary. 7. Bring a current list of your medications and current insurance cards. 8. You MUST have a responsible person to drive you home. 9. Someone MUST be with you the first 24 hours after you arrive home or your discharge will be delayed. 10. Please wear clothes  that are easy to get on and off and wear slip-on shoes.  Thank you for allowing us  to care for you!   -- [facility]     We recommend signing up for the patient portal called MyChart.  Sign up information is provided on this After Visit Summary.  MyChart is used to connect with patients for Virtual Visits (Telemedicine).  Patients are able to view lab/test results, encounter notes, upcoming appointments, etc.  Non-urgent messages can be sent to your provider as well.   To learn more about what you can do with MyChart, go to [email or web address].     Signed, Chevi Lim, MD  07/01/2024 10:27 AM    [facility]     [1]  Current Meds  Medication Sig   acetaminophen  (TYLENOL ) 500 MG tablet Take 1,000 mg by mouth 2 (two) times daily as needed for mild pain or moderate pain.   albuterol  (VENTOLIN  HFA) 108 (90 Base) MCG/ACT inhaler Inhale 1 puff into the lungs every 6 (six) hours as needed FOR SHORTNESS OF BREATH / WEEZING    aspirin  EC 81 MG tablet Take 1 tablet (81 mg total) by mouth daily at 6 (six) AM. Swallow whole.   atorvastatin  (LIPITOR) 80 MG tablet Take 1 tablet (80 mg total) by mouth daily.   buPROPion  (WELLBUTRIN  SR) 150 MG 12 hr tablet Take 1 tablet (150 mg total) by mouth 2 (two) times daily.   carvedilol  (COREG ) 6.25 MG tablet Take 1 tablet (6.25 mg total) by mouth 2 (two) times daily.   cetirizine  (ZYRTEC ) 10 MG tablet Take 10 mg by mouth daily as needed for allergies.   citalopram  (CELEXA ) 40 MG tablet Take 1 tablet (40 mg total) by mouth daily.   clopidogrel  (PLAVIX ) 75 MG tablet Take 1 tablet (75 mg total) by mouth daily.   Continuous Glucose Receiver (DEXCOM G7 RECEIVER) DEVI Use with Dexcom G7 sensors to monitor your glucose continuously   Continuous Glucose Sensor (FREESTYLE LIBRE 3 PLUS SENSOR) MISC Change sensor every 15 days.   ezetimibe  (ZETIA ) 10 MG tablet Take 1 tablet (10 mg total) by mouth daily.   Fluticasone -Umeclidin-Vilant (TRELEGY ELLIPTA ) 200-62.5-25 MCG/ACT AEPB Inhale 1 puff into the lungs daily.   gabapentin  (NEURONTIN ) 300 MG capsule Take 1 capsule (300 mg total) by mouth at bedtime.   glucose blood test strip Use as instructed   ibuprofen  (ADVIL ) 200 MG tablet Take 600 mg by mouth every 6 (six) hours as needed for mild pain (pain score 1-3) or moderate pain (pain score 4-6).   Insulin  Glargine (BASAGLAR  KWIKPEN) 100 UNIT/ML Inject 36 Units into the skin daily.   Insulin  Pen Needle (PEN NEEDLES) 31G X 5 MM MISC 1 each by Does not apply route daily. Use to inject insulin    lisinopril  (ZESTRIL ) 5 MG tablet Take 1 tablet (5 mg total) by mouth daily.   nitroGLYCERIN  (NITROSTAT ) 0.4 MG SL tablet Place 1 tablet (0.4 mg total) under the tongue every 5 (five) minutes as needed for chest pain.   pantoprazole  (PROTONIX ) 20 MG tablet Take 1 tablet (20 mg total) by mouth daily.   tiotropium (SPIRIVA ) 18 MCG inhalation capsule Place 18 mcg into inhaler and inhale daily.   "

## 2024-06-26 ENCOUNTER — Encounter: Payer: Self-pay | Admitting: Hematology and Oncology

## 2024-07-01 ENCOUNTER — Encounter: Payer: Self-pay | Admitting: Hematology and Oncology

## 2024-07-01 ENCOUNTER — Ambulatory Visit: Attending: Cardiology | Admitting: Cardiology

## 2024-07-01 ENCOUNTER — Other Ambulatory Visit (HOSPITAL_COMMUNITY): Payer: Self-pay

## 2024-07-01 ENCOUNTER — Encounter: Payer: Self-pay | Admitting: Cardiology

## 2024-07-01 VITALS — BP 124/64 | HR 103 | Ht 62.0 in | Wt 153.8 lb

## 2024-07-01 DIAGNOSIS — Z9889 Other specified postprocedural states: Secondary | ICD-10-CM | POA: Insufficient documentation

## 2024-07-01 DIAGNOSIS — I5020 Unspecified systolic (congestive) heart failure: Secondary | ICD-10-CM | POA: Diagnosis not present

## 2024-07-01 DIAGNOSIS — I10 Essential (primary) hypertension: Secondary | ICD-10-CM | POA: Diagnosis not present

## 2024-07-01 DIAGNOSIS — I70213 Atherosclerosis of native arteries of extremities with intermittent claudication, bilateral legs: Secondary | ICD-10-CM | POA: Insufficient documentation

## 2024-07-01 DIAGNOSIS — I2089 Other forms of angina pectoris: Secondary | ICD-10-CM | POA: Insufficient documentation

## 2024-07-01 DIAGNOSIS — I25110 Atherosclerotic heart disease of native coronary artery with unstable angina pectoris: Secondary | ICD-10-CM

## 2024-07-01 DIAGNOSIS — I739 Peripheral vascular disease, unspecified: Secondary | ICD-10-CM | POA: Insufficient documentation

## 2024-07-01 LAB — CBC WITH DIFFERENTIAL/PLATELET

## 2024-07-01 MED ORDER — NITROGLYCERIN 0.4 MG SL SUBL
0.4000 mg | SUBLINGUAL_TABLET | SUBLINGUAL | 3 refills | Status: AC | PRN
  Filled 2024-07-01: qty 75, 90d supply, fill #0

## 2024-07-01 MED ORDER — CARVEDILOL 6.25 MG PO TABS
6.2500 mg | ORAL_TABLET | Freq: Two times a day (BID) | ORAL | 3 refills | Status: DC
  Filled 2024-07-01: qty 180, 90d supply, fill #0

## 2024-07-01 NOTE — Patient Instructions (Signed)
 Medication Instructions:  Take NTG under tongue as needed for chest pain Start Coreg  6.25 mg twice a day Continue all other medications *If you need a refill on your cardiac medications before your next appointment, please call your pharmacy*  Lab Work: Have cmet,cbc,lipid panel,A1c today  Testing/Procedures: Cardiac Cath scheduled at [facility] Monday 1/9   Arrive at 5:30 am    Follow instructions below  Follow-Up: At [facility], you and your health needs are our priority.  As part of our continuing mission to provide you with exceptional heart care, our providers are all part of one team.  This team includes your primary Cardiologist (physician) and Advanced Practice Providers or APPs (Physician Assistants and Nurse Practitioners) who all work together to provide you with the care you need, when you need it.  Your next appointment:  After Cath    Provider:  Dr.Jordan           [place] [facility] A DEPT OF [place]ARTCARE AT [address] A DEPT OF THE [place] [address] [place] [address] Dept: [phone] Loc: [phone]  Vanessa [name]  07/01/2024  You are scheduled for a Cardiac Catheterization on Monday, January 19 with Dr. Peter Jordan.  1. Please arrive at the [facility] (Main Entrance A) at [facility]: [address] [place], [place] [address] at 5:30 AM (This time is 2 hour(s) before your procedure to ensure your preparation).   Free valet parking service is available. You will check in at ADMITTING. The support person will be asked to wait in the waiting room.  It is OK to have someone drop you off and come back when you are ready to be discharged.    Special note: Every effort is made to have your procedure done on time. Please understand that emergencies sometimes delay scheduled procedures.  2. Diet: Nothing to eat after midnight.   3. Hydration: You need to be well hydrated before your  procedure. On January 19, you may drink approved liquids (see below) until 2 hours before the procedure, with 16 oz of water  as your last intake.   List of approved liquids water , clear juice, clear tea, black coffee, fruit juices, non-citric and without pulp, carbonated beverages, Gatorade, Kool -Aid, plain Jello-O and plain ice popsicles.  4. Labs: You will need to have blood drawn on today 1/6 [facility] 1st floor  5. Medication instructions in preparation for your procedure:      Take 1/2 dose of Insulin  night before Hold morning dose of Insulin      On the morning of your procedure, take your Aspirin  81 mg and Plavix /Clopidogrel  and any morning medicines NOT listed above.  You may use sips of water .  6. Plan to go home the same day, you will only stay overnight if medically necessary. 7. Bring a current list of your medications and current insurance cards. 8. You MUST have a responsible person to drive you home. 9. Someone MUST be with you the first 24 hours after you arrive home or your discharge will be delayed. 10. Please wear clothes that are easy to get on and off and wear slip-on shoes.  Thank you for allowing us  to care for you!   -- [facility]     We recommend signing up for the patient portal called MyChart.  Sign up information is provided on this After Visit Summary.  MyChart is used  to connect with patients for Virtual Visits (Telemedicine).  Patients are able to view lab/test results, encounter notes, upcoming appointments, etc.  Non-urgent messages can be sent to your provider as well.   To learn more about what you can do with MyChart, go to [email or web address].

## 2024-07-02 ENCOUNTER — Other Ambulatory Visit: Payer: Self-pay

## 2024-07-02 ENCOUNTER — Ambulatory Visit: Payer: Self-pay | Admitting: Cardiology

## 2024-07-02 DIAGNOSIS — E785 Hyperlipidemia, unspecified: Secondary | ICD-10-CM

## 2024-07-02 DIAGNOSIS — I25110 Atherosclerotic heart disease of native coronary artery with unstable angina pectoris: Secondary | ICD-10-CM

## 2024-07-02 LAB — COMPREHENSIVE METABOLIC PANEL WITH GFR
ALT: 10 IU/L (ref 0–32)
AST: 21 IU/L (ref 0–40)
Albumin: 4 g/dL (ref 3.9–4.9)
Alkaline Phosphatase: 132 IU/L (ref 49–135)
BUN/Creatinine Ratio: 12 (ref 12–28)
BUN: 7 mg/dL — ABNORMAL LOW (ref 8–27)
Bilirubin Total: 0.4 mg/dL (ref 0.0–1.2)
CO2: 25 mmol/L (ref 20–29)
Calcium: 9.8 mg/dL (ref 8.7–10.3)
Chloride: 95 mmol/L — ABNORMAL LOW (ref 96–106)
Creatinine, Ser: 0.59 mg/dL (ref 0.57–1.00)
Globulin, Total: 2.5 g/dL (ref 1.5–4.5)
Glucose: 319 mg/dL — ABNORMAL HIGH (ref 70–99)
Potassium: 4.5 mmol/L (ref 3.5–5.2)
Sodium: 134 mmol/L (ref 134–144)
Total Protein: 6.5 g/dL (ref 6.0–8.5)
eGFR: 101 mL/min/1.73

## 2024-07-02 LAB — CBC WITH DIFFERENTIAL/PLATELET
Basos: 1 %
EOS (ABSOLUTE): 0.1 x10E3/uL (ref 0.0–0.2)
Eos: 4 %
Hematocrit: 42.1 % (ref 34.0–46.6)
Hemoglobin: 14.2 g/dL (ref 11.1–15.9)
Immature Granulocytes: 0 %
Immature Granulocytes: 0 x10E3/uL (ref 0.0–0.1)
Lymphs: 17 %
MCH: 29.4 pg (ref 26.6–33.0)
MCHC: 33.7 g/dL (ref 31.5–35.7)
MCV: 87 fL (ref 79–97)
Monocytes Absolute: 0.4 x10E3/uL (ref 0.0–0.4)
Monocytes Absolute: 0.6 x10E3/uL (ref 0.1–0.9)
Monocytes: 5 %
Neutrophils Absolute: 1.9 x10E3/uL (ref 0.7–3.1)
Neutrophils Absolute: 7.7 x10E3/uL — AB (ref 1.4–7.0)
Neutrophils: 73 %
Platelets: 461 x10E3/uL — AB (ref 150–450)
RBC: 4.83 x10E6/uL (ref 3.77–5.28)
RDW: 13 % (ref 11.7–15.4)
WBC: 10.8 x10E3/uL (ref 3.4–10.8)

## 2024-07-02 LAB — HEMOGLOBIN A1C
Est. average glucose Bld gHb Est-mCnc: 309 mg/dL
Hgb A1c MFr Bld: 12.4 % — AB (ref 4.8–5.6)

## 2024-07-02 LAB — LIPID PANEL
Chol/HDL Ratio: 9.1 ratio — ABNORMAL HIGH (ref 0.0–4.4)
Cholesterol, Total: 299 mg/dL — ABNORMAL HIGH (ref 100–199)
HDL: 33 mg/dL — ABNORMAL LOW
LDL Chol Calc (NIH): 182 mg/dL — ABNORMAL HIGH (ref 0–99)
Triglycerides: 420 mg/dL — ABNORMAL HIGH (ref 0–149)
VLDL Cholesterol Cal: 84 mg/dL — ABNORMAL HIGH (ref 5–40)

## 2024-07-03 ENCOUNTER — Encounter: Payer: Self-pay | Admitting: Hematology and Oncology

## 2024-07-03 ENCOUNTER — Other Ambulatory Visit: Payer: Self-pay

## 2024-07-03 ENCOUNTER — Encounter: Payer: Self-pay | Admitting: Student

## 2024-07-03 ENCOUNTER — Telehealth: Payer: Self-pay

## 2024-07-03 ENCOUNTER — Ambulatory Visit: Admitting: Student

## 2024-07-03 VITALS — BP 106/59 | HR 99 | Temp 97.6°F | Ht 62.0 in | Wt 158.2 lb

## 2024-07-03 DIAGNOSIS — E119 Type 2 diabetes mellitus without complications: Secondary | ICD-10-CM | POA: Diagnosis not present

## 2024-07-03 DIAGNOSIS — J449 Chronic obstructive pulmonary disease, unspecified: Secondary | ICD-10-CM

## 2024-07-03 DIAGNOSIS — Z794 Long term (current) use of insulin: Secondary | ICD-10-CM

## 2024-07-03 DIAGNOSIS — J9611 Chronic respiratory failure with hypoxia: Secondary | ICD-10-CM | POA: Diagnosis present

## 2024-07-03 DIAGNOSIS — Z7985 Long-term (current) use of injectable non-insulin antidiabetic drugs: Secondary | ICD-10-CM | POA: Diagnosis not present

## 2024-07-03 MED ORDER — TRELEGY ELLIPTA 200-62.5-25 MCG/ACT IN AEPB: 1.0000 | INHALATION_SPRAY | Freq: Every day | RESPIRATORY_TRACT | 9 refills | Status: DC

## 2024-07-03 MED ORDER — MOUNJARO 2.5 MG/0.5ML ~~LOC~~ SOAJ
2.5000 mg | SUBCUTANEOUS | 1 refills | Status: DC
  Filled 2024-07-03 – 2024-07-04 (×3): qty 2, 28d supply, fill #0

## 2024-07-03 MED ORDER — DEXCOM G7 SENSOR MISC: 3 refills | Status: DC

## 2024-07-03 MED ORDER — TRELEGY ELLIPTA 200-62.5-25 MCG/ACT IN AEPB
1.0000 | INHALATION_SPRAY | Freq: Every day | RESPIRATORY_TRACT | 9 refills | Status: AC
  Filled 2024-07-03 (×2): qty 60, 30d supply, fill #0

## 2024-07-03 MED ORDER — DEXCOM G7 RECEIVER DEVI: 1 refills | Status: DC

## 2024-07-03 MED ORDER — DEXCOM G7 RECEIVER DEVI
1 refills | Status: AC
  Filled 2024-07-03: qty 1, 365d supply, fill #0
  Filled 2024-07-03: qty 1, 10d supply, fill #0

## 2024-07-03 NOTE — Telephone Encounter (Signed)
 Beverley Graff (Key: [id number]) PA Case ID #: [id number] Rx #: [id number] Need Help? Call us  at [phone] Outcome Approved today by [facility] 2017 NCPDP Request Reference Number: [id number]. TRELEGY AER is approved through 07/03/2025. For further questions, call [employer] at [phone]. Effective Date: 07/03/2024 Authorization Expiration Date: 07/03/2025 Drug Trelegy Ellipta  200-62.5-25MCG/ACT aerosol powder ePA cloud logo Form [employer] Electronic Prior Authorization Form [phone] NCPDP) Original Claim Info 85

## 2024-07-03 NOTE — Patient Instructions (Signed)
 Thank you, Ms.[name], for allowing us  to provide your care today. Today we discussed . . .  > Diabetes       - Please resume taking 36 units of insulin  glargine which is Lantus  daily and use Humalog or NovoLog  5 units before large meals.  I have sent in another prescription for your Dexcom but let us  know if you are unable to get this.  We will also start Mounjaro  2.5 mg weekly.  Please let us  know if you have any new nausea, vomiting, or abdominal pain with this medicine.  We will see you back in 1 month to review your glucose levels and discuss any changes in medication needed.  Please let us  know if you have any low blood sugars before then. > COPD       - Today I have sent in a new prescription for Trelegy and we will give you a sample today.  We have also sent a referral for home oxygen so let us  know if you have any trouble getting this.  If you have any new or worsening breathing troubles please let us  know or go to the emergency department to be evaluated.  Referrals ordered today:    Referral Orders         AMB REFERRAL FOR DME       Follow up: 1 month    Remember:  Should you have any questions or concerns please call the internal medicine clinic at [phone].     Fairy Pool, DO [facility]

## 2024-07-03 NOTE — Assessment & Plan Note (Signed)
 A1c on 07/01/2024 is 12.4.  This is increased from 11.1 in 03/18/2024.  She has not taken her insulin  for 1 month but is ready to resume this as well as work harder on her health all-around.  She previously did well with the CGM so we will resend this but she also has a glucometer at home if she needs a prior Auth.  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 - Resume insulin  glargine 36 units daily and 5 units of short acting 2-3 times daily with meals - Start Mounjaro  2.5 mg weekly - Refill Dexcom G7 CGM - Return in 1 month to review CGM data

## 2024-07-03 NOTE — Telephone Encounter (Signed)
 Prior Authorization for patient (Trelegy Ellipta  200-62.5-25MCG/ACT aerosol powder) came through on cover my meds was submitted with last office notes and labs awaiting approval or denial.  KEY: [id number]

## 2024-07-03 NOTE — Addendum Note (Signed)
 Addended by: CAMIE ARLAND HERO on: 07/03/2024 04:44 PM   Modules accepted: Orders

## 2024-07-03 NOTE — Assessment & Plan Note (Signed)
 Oxygen level at rest today is 87-90% and 85% with ambulation on room air.  She was placed on 2 L of oxygen with improvement to 91% while ambulating and 96% at rest.  Since her visit in September 2025 where she was given a sample of Trelegy she has not had anything but an albuterol  inhaler.  On exam she does not have significant wheezing, signs of fluid overload, or signs of COPD exacerbation.  She is warm and well-perfused.  Is difficult to tell how much of her hypoxic respiratory failure is due to her COPD versus her likely ischemic cardiac disease for which she is undergoing a cardiac catheterization on 07/14/2024.  We will give her another sample inhaler of Trelegy and  send this to the pharmacy now she has insurance. - DME referral for home oxygen with portable concentrator - Trelegy 1 puff daily - Strict return precautions for increased dyspnea

## 2024-07-03 NOTE — Progress Notes (Signed)
 "  CC: Routine Follow Up for management of chronic medical conditions after last office visit 06/02/2024  HPI:  Vanessa [name] is a 64 y.o. female with pertinent PMH of T2DM on insulin , HTN, severe PAD s/p right axillary femoral bypass, COPD who presents as above. Please see assessment and plan below for further details.  Medications: Current Outpatient Medications  Medication Instructions   acetaminophen  (TYLENOL ) 1,000 mg, 2 times daily PRN   albuterol  (VENTOLIN  HFA) 108 (90 Base) MCG/ACT inhaler Inhale 1 puff into the lungs every 6 (six) hours as needed FOR SHORTNESS OF BREATH / WEEZING   aspirin  EC 81 mg, Oral, Daily, Swallow whole.   atorvastatin  (LIPITOR) 80 mg, Oral, Daily   Basaglar  KwikPen 36 Units, Subcutaneous, Daily   buPROPion  (WELLBUTRIN  SR) 150 mg, Oral, 2 times daily   carvedilol  (COREG ) 6.25 mg, Oral, 2 times daily   cetirizine  (ZYRTEC ) 10 mg, Daily PRN   citalopram  (CELEXA ) 40 mg, Oral, Daily   clopidogrel  (PLAVIX ) 75 mg, Oral, Daily   Continuous Glucose Receiver (DEXCOM G7 RECEIVER) DEVI Use with Dexcom G7 sensors to monitor your glucose continuously   Continuous Glucose Sensor (FREESTYLE LIBRE 3 PLUS SENSOR) MISC Change sensor every 15 days.   ezetimibe  (ZETIA ) 10 mg, Oral, Daily   Fluticasone -Umeclidin-Vilant (TRELEGY ELLIPTA ) 200-62.5-25 MCG/ACT AEPB 1 puff, Inhalation, Daily   gabapentin  (NEURONTIN ) 300 mg, Oral, Daily at bedtime   glucose blood test strip Use as instructed   ibuprofen  (ADVIL ) 600 mg, Every 6 hours PRN   Insulin  Pen Needle (PEN NEEDLES) 31G X 5 MM MISC 1 each, Does not apply, Daily, Use to inject insulin    lisinopril  (ZESTRIL ) 5 mg, Oral, Daily   Mounjaro  2.5 mg, Subcutaneous, Weekly   nitroGLYCERIN  (NITROSTAT ) 0.4 mg, Sublingual, Every 5 min PRN   pantoprazole  (PROTONIX ) 20 mg, Oral, Daily   tiotropium (SPIRIVA ) 18 mcg, Daily     Review of Systems:   Pertinent items noted in HPI and/or A&P.  Physical Exam:  Vitals:   07/03/24 1409 07/03/24  1416  BP: (!) 103/50 (!) 106/59  Pulse: (!) 105 99  Temp: 97.6 F (36.4 C)   TempSrc: Oral   SpO2: 90% (!) 87%  Weight: 158 lb 3.2 oz (71.8 kg)   Height: 5' 2 (1.575 m)     Constitutional: Chronically ill and tired appearing female. In no acute distress. HEENT: Normocephalic, atraumatic, Sclera non-icteric, PERRL, EOM intact Cardio:Regular rate and rhythm. 2+ bilateral radial pulses.  Extremities are warm and well-perfused Pulm:Clear to auscultation bilaterally. Normal work of breathing on room air. MSK: Trace pitting edema of the lower extremities bilaterally Skin:Warm and dry. Neuro:Alert and oriented x3. No focal deficit noted. Psych:Pleasant mood and affect.   Assessment & Plan:   Assessment & Plan Chronic hypoxic respiratory failure (HCC) Chronic obstructive pulmonary disease, unspecified COPD type (HCC) Oxygen level at rest today is 87-90% and 85% with ambulation on room air.  She was placed on 2 L of oxygen with improvement to 91% while ambulating and 96% at rest.  Since her visit in September 2025 where she was given a sample of Trelegy she has not had anything but an albuterol  inhaler.  On exam she does not have significant wheezing, signs of fluid overload, or signs of COPD exacerbation.  She is warm and well-perfused.  Is difficult to tell how much of her hypoxic respiratory failure is due to her COPD versus her likely ischemic cardiac disease for which she is undergoing a cardiac catheterization on 07/14/2024.  We will give her another sample inhaler of Trelegy and  send this to the pharmacy now she has insurance. - DME referral for home oxygen with portable concentrator - Trelegy 1 puff daily - Strict return precautions for increased dyspnea Diabetes mellitus, type II, insulin  dependent (HCC) A1c on 07/01/2024 is 12.4.  This is increased from 11.1 in 03/18/2024.  She has not taken her insulin  for 1 month but is ready to resume this as well as work harder on her health  all-around.  She previously did well with the CGM so we will resend this but she also has a glucometer at home if she needs a prior Auth.  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 - Resume insulin  glargine 36 units daily and 5 units of short acting 2-3 times daily with meals - Start Mounjaro  2.5 mg weekly - Refill Dexcom G7 CGM - Return in 1 month to review CGM data  Orders Placed This Encounter  Procedures   AMB REFERRAL FOR DME    Referral Priority:   Routine    Referral Type:   Durable Medical Equipment Purchase    Number of Visits Requested:   1     Return in about 4 weeks (around 07/31/2024) for T2DM, COPD.   Patient discussed with Dr. Layman Bee  Fairy Pool, DO [facility] Internal Medicine Resident PGY-3 Clinic Phone: [phone] Please contact the on call pager at [phone] for any urgent or emergent needs. "

## 2024-07-03 NOTE — Progress Notes (Signed)
 SATURATION QUALIFICATIONS: (This note is used to comply with regulatory documentation for home oxygen)   Patient Saturations on Room Air at Rest = 88%   Patient Saturations on Room Air while Ambulating = 85%   Patient Saturations on 2 Liters of oxygen while Ambulating = 91%   Patient Saturations   on 2 liters  of oxygen while sitting for 2 mins =96%

## 2024-07-04 ENCOUNTER — Encounter: Payer: Self-pay | Admitting: Hematology and Oncology

## 2024-07-04 ENCOUNTER — Other Ambulatory Visit: Payer: Self-pay

## 2024-07-04 ENCOUNTER — Telehealth (HOSPITAL_COMMUNITY): Payer: Self-pay

## 2024-07-04 ENCOUNTER — Other Ambulatory Visit (HOSPITAL_COMMUNITY): Payer: Self-pay

## 2024-07-04 ENCOUNTER — Other Ambulatory Visit: Payer: Self-pay | Admitting: Student

## 2024-07-04 ENCOUNTER — Telehealth: Payer: Self-pay

## 2024-07-04 DIAGNOSIS — J449 Chronic obstructive pulmonary disease, unspecified: Secondary | ICD-10-CM

## 2024-07-04 DIAGNOSIS — E119 Type 2 diabetes mellitus without complications: Secondary | ICD-10-CM

## 2024-07-04 MED ORDER — PEN NEEDLES 32G X 4 MM MISC
11 refills | Status: AC
  Filled 2024-07-04: qty 100, 25d supply, fill #0

## 2024-07-04 MED ORDER — ALBUTEROL SULFATE HFA 108 (90 BASE) MCG/ACT IN AERS
2.0000 | INHALATION_SPRAY | Freq: Four times a day (QID) | RESPIRATORY_TRACT | 6 refills | Status: AC | PRN
  Filled 2024-07-04 – 2024-07-15 (×2): qty 6.7, 25d supply, fill #0

## 2024-07-04 MED ORDER — DEXCOM G7 SENSOR MISC
3 refills | Status: AC
  Filled 2024-07-04: qty 3, 30d supply, fill #0
  Filled 2024-07-31 (×2): qty 3, 30d supply, fill #1

## 2024-07-04 NOTE — Telephone Encounter (Signed)
 PA request has been Received. New Encounter has been or will be created for follow up. For additional info see Pharmacy Prior Auth telephone encounter from 07/04/24.

## 2024-07-04 NOTE — Telephone Encounter (Unsigned)
 Copied from CRM [id number]. Topic: Clinical - Prescription Issue >> Jul 04, 2024 10:40 AM Chiquita SQUIBB wrote: Reason for CRM: Patient is calling in regarding getting a notice about a Prior Authorization for patient (Dexcom G7 Sensor), advised patient that a message has already been put in the chart that they are working on it and the office received this as well. Patient is requesting an update on weather it is approved or denied when it becomes available.

## 2024-07-04 NOTE — Telephone Encounter (Signed)
 Prior Authorization for patient (Dexcom G7 Sensor) came through on cover my meds was submitted with last office notes and labs awaiting approval or denial.  KEY: [id number]

## 2024-07-04 NOTE — Addendum Note (Signed)
 Addended by: Thuan Tippett on: 07/04/2024 01:39 PM   Modules accepted: Orders

## 2024-07-04 NOTE — Telephone Encounter (Signed)
 Vanessa [name] (Key: [id number]) PA Case ID #: [id number] Rx #: [id number] Need Help? Call us  at [phone] Outcome Approved today by [facility] 2017 NCPDP Request Reference Number: [id number]. DEXCOM G7 MIS SENSOR is approved through 01/01/2025. For further questions, call [employer] at [phone]. Effective Date: 07/04/2024 Authorization Expiration Date: 01/01/2025 Drug Dexcom G7 Sensor ePA cloud logo Form [employer] Electronic Prior Authorization Form [phone] NCPDP) Original Claim Info [phone]  Patient is aware of approval.

## 2024-07-04 NOTE — Telephone Encounter (Signed)
 Pharmacy Patient Advocate Encounter   Received notification from Pt Calls Messages that prior authorization for Mounjaro  2.5MG /0.5ML auto-injectors  is required/requested.   Insurance verification completed.   The patient is insured through [facility] MEDICAID.   Per test claim:  Byetta, Trulicity, Victoza  or Ozempic is preferred by the insurance.  If suggested medication is appropriate, Please send in a new RX and discontinue this one. If not, please advise as to why it's not appropriate so that we may request a Prior Authorization. Please note, some preferred medications may still require a PA.  If the suggested medications have not been trialed and there are no contraindications to their use, the PA will not be submitted, as it will not be approved.  *Medicaid requires a trial and failure of at least 2 of the above mentioned drugs, unless there is a documented clinical reason why patient can't take these.

## 2024-07-06 NOTE — Progress Notes (Signed)
 Internal Medicine Clinic Attending  Case discussed with the resident at the time of the visit.  We reviewed the resident's history and exam and pertinent patient test results.  I agree with the assessment, diagnosis, and plan of care documented in the resident's note.

## 2024-07-08 ENCOUNTER — Other Ambulatory Visit (HOSPITAL_COMMUNITY): Payer: Self-pay

## 2024-07-10 ENCOUNTER — Other Ambulatory Visit (HOSPITAL_COMMUNITY): Payer: Self-pay

## 2024-07-10 ENCOUNTER — Telehealth: Payer: Self-pay | Admitting: *Deleted

## 2024-07-10 NOTE — Telephone Encounter (Signed)
 Cardiac Catheterization scheduled at [facility] for: Monday July 14, 2024 7:30 AM Arrival time [facility] Main Entrance A at: 5:30 AM  Diet: -Nothing to eat after midnight.  Hydration: -May drink clear liquids until 2 hours before the procedure.  Approved liquids: Water , clear tea, black coffee, fruit juices-non-citric and without pulp,Gatorade, plain Jello/popsicles.   -Please drink 16  oz of water  2 hours before procedure.  Medication instructions: -Hold:  Insulin -AM of procedure /1/2 usual Insulin  dose PM prior to procedure -Other usual morning medications can be taken including aspirin  81 mg and Plavix  75 mg.  Pt tells me she has not started Mounjaro .  Plan to go home the same day, you will only stay overnight if medically necessary.  You must have responsible adult to drive you home.  Someone must be with you the first 24 hours after you arrive home.  Reviewed procedure instructions with patient.

## 2024-07-10 NOTE — Telephone Encounter (Signed)
 Pharmacy Patient Advocate Encounter  Received notification from [facility] MEDICAID that Prior Authorization for Dexcom G7 Sensors has been APPROVED from 07/04/24 to 07/04/25. Unable to obtain price due to refill too soon rejection, last fill date 07/04/24 next available fill date02/01/26   PA #/Case ID/Reference #: [id number]

## 2024-07-11 ENCOUNTER — Emergency Department (HOSPITAL_COMMUNITY)

## 2024-07-11 ENCOUNTER — Ambulatory Visit: Admitting: Cardiology

## 2024-07-11 ENCOUNTER — Other Ambulatory Visit: Payer: Self-pay

## 2024-07-11 ENCOUNTER — Inpatient Hospital Stay (HOSPITAL_COMMUNITY)
Admission: EM | Admit: 2024-07-11 | Discharge: 2024-07-17 | DRG: 280 | Disposition: A | Discharge status: Home / Self Care | Attending: Internal Medicine | Admitting: Internal Medicine

## 2024-07-11 DIAGNOSIS — I255 Ischemic cardiomyopathy: Secondary | ICD-10-CM | POA: Diagnosis present

## 2024-07-11 DIAGNOSIS — F411 Generalized anxiety disorder: Secondary | ICD-10-CM | POA: Diagnosis present

## 2024-07-11 DIAGNOSIS — E8729 Other acidosis: Secondary | ICD-10-CM | POA: Diagnosis present

## 2024-07-11 DIAGNOSIS — F17200 Nicotine dependence, unspecified, uncomplicated: Secondary | ICD-10-CM | POA: Diagnosis present

## 2024-07-11 DIAGNOSIS — J9621 Acute and chronic respiratory failure with hypoxia: Secondary | ICD-10-CM | POA: Diagnosis present

## 2024-07-11 DIAGNOSIS — F17211 Nicotine dependence, cigarettes, in remission: Secondary | ICD-10-CM | POA: Diagnosis present

## 2024-07-11 DIAGNOSIS — R0603 Acute respiratory distress: Principal | ICD-10-CM

## 2024-07-11 DIAGNOSIS — Z91040 Latex allergy status: Secondary | ICD-10-CM

## 2024-07-11 DIAGNOSIS — Z8041 Family history of malignant neoplasm of ovary: Secondary | ICD-10-CM

## 2024-07-11 DIAGNOSIS — I959 Hypotension, unspecified: Secondary | ICD-10-CM | POA: Diagnosis not present

## 2024-07-11 DIAGNOSIS — I214 Non-ST elevation (NSTEMI) myocardial infarction: Secondary | ICD-10-CM | POA: Diagnosis present

## 2024-07-11 DIAGNOSIS — F17291 Nicotine dependence, other tobacco product, in remission: Secondary | ICD-10-CM | POA: Diagnosis present

## 2024-07-11 DIAGNOSIS — I25110 Atherosclerotic heart disease of native coronary artery with unstable angina pectoris: Secondary | ICD-10-CM

## 2024-07-11 DIAGNOSIS — J9691 Respiratory failure, unspecified with hypoxia: Secondary | ICD-10-CM | POA: Diagnosis present

## 2024-07-11 DIAGNOSIS — E1136 Type 2 diabetes mellitus with diabetic cataract: Secondary | ICD-10-CM | POA: Diagnosis present

## 2024-07-11 DIAGNOSIS — E119 Type 2 diabetes mellitus without complications: Secondary | ICD-10-CM

## 2024-07-11 DIAGNOSIS — Z90722 Acquired absence of ovaries, bilateral: Secondary | ICD-10-CM

## 2024-07-11 DIAGNOSIS — R739 Hyperglycemia, unspecified: Secondary | ICD-10-CM | POA: Insufficient documentation

## 2024-07-11 DIAGNOSIS — Z7951 Long term (current) use of inhaled steroids: Secondary | ICD-10-CM

## 2024-07-11 DIAGNOSIS — Z8249 Family history of ischemic heart disease and other diseases of the circulatory system: Secondary | ICD-10-CM

## 2024-07-11 DIAGNOSIS — Z87442 Personal history of urinary calculi: Secondary | ICD-10-CM

## 2024-07-11 DIAGNOSIS — K219 Gastro-esophageal reflux disease without esophagitis: Secondary | ICD-10-CM | POA: Diagnosis present

## 2024-07-11 DIAGNOSIS — Z9079 Acquired absence of other genital organ(s): Secondary | ICD-10-CM

## 2024-07-11 DIAGNOSIS — Z1152 Encounter for screening for COVID-19: Secondary | ICD-10-CM

## 2024-07-11 DIAGNOSIS — J45901 Unspecified asthma with (acute) exacerbation: Secondary | ICD-10-CM | POA: Diagnosis present

## 2024-07-11 DIAGNOSIS — G9349 Other encephalopathy: Secondary | ICD-10-CM | POA: Diagnosis present

## 2024-07-11 DIAGNOSIS — Z794 Long term (current) use of insulin: Secondary | ICD-10-CM

## 2024-07-11 DIAGNOSIS — I5043 Acute on chronic combined systolic (congestive) and diastolic (congestive) heart failure: Secondary | ICD-10-CM | POA: Diagnosis present

## 2024-07-11 DIAGNOSIS — I2510 Atherosclerotic heart disease of native coronary artery without angina pectoris: Secondary | ICD-10-CM | POA: Diagnosis present

## 2024-07-11 DIAGNOSIS — I272 Pulmonary hypertension, unspecified: Secondary | ICD-10-CM | POA: Diagnosis present

## 2024-07-11 DIAGNOSIS — R970 Elevated carcinoembryonic antigen [CEA]: Secondary | ICD-10-CM | POA: Diagnosis present

## 2024-07-11 DIAGNOSIS — J9811 Atelectasis: Secondary | ICD-10-CM | POA: Diagnosis present

## 2024-07-11 DIAGNOSIS — I5021 Acute systolic (congestive) heart failure: Secondary | ICD-10-CM

## 2024-07-11 DIAGNOSIS — E781 Pure hyperglyceridemia: Secondary | ICD-10-CM | POA: Diagnosis present

## 2024-07-11 DIAGNOSIS — Z79899 Other long term (current) drug therapy: Secondary | ICD-10-CM

## 2024-07-11 DIAGNOSIS — D75839 Thrombocytosis, unspecified: Secondary | ICD-10-CM | POA: Diagnosis present

## 2024-07-11 DIAGNOSIS — J441 Chronic obstructive pulmonary disease with (acute) exacerbation: Principal | ICD-10-CM | POA: Diagnosis present

## 2024-07-11 DIAGNOSIS — Z888 Allergy status to other drugs, medicaments and biological substances status: Secondary | ICD-10-CM

## 2024-07-11 DIAGNOSIS — F410 Panic disorder [episodic paroxysmal anxiety] without agoraphobia: Secondary | ICD-10-CM | POA: Diagnosis present

## 2024-07-11 DIAGNOSIS — R7989 Other specified abnormal findings of blood chemistry: Secondary | ICD-10-CM | POA: Insufficient documentation

## 2024-07-11 DIAGNOSIS — C569 Malignant neoplasm of unspecified ovary: Secondary | ICD-10-CM | POA: Diagnosis present

## 2024-07-11 DIAGNOSIS — J440 Chronic obstructive pulmonary disease with acute lower respiratory infection: Secondary | ICD-10-CM | POA: Diagnosis present

## 2024-07-11 DIAGNOSIS — Z7902 Long term (current) use of antithrombotics/antiplatelets: Secondary | ICD-10-CM

## 2024-07-11 DIAGNOSIS — Z8543 Personal history of malignant neoplasm of ovary: Secondary | ICD-10-CM

## 2024-07-11 DIAGNOSIS — R011 Cardiac murmur, unspecified: Secondary | ICD-10-CM | POA: Diagnosis present

## 2024-07-11 DIAGNOSIS — Z90710 Acquired absence of both cervix and uterus: Secondary | ICD-10-CM

## 2024-07-11 DIAGNOSIS — I509 Heart failure, unspecified: Secondary | ICD-10-CM

## 2024-07-11 DIAGNOSIS — E1151 Type 2 diabetes mellitus with diabetic peripheral angiopathy without gangrene: Secondary | ICD-10-CM | POA: Diagnosis present

## 2024-07-11 DIAGNOSIS — I110 Hypertensive heart disease with heart failure: Principal | ICD-10-CM | POA: Diagnosis present

## 2024-07-11 DIAGNOSIS — I6523 Occlusion and stenosis of bilateral carotid arteries: Secondary | ICD-10-CM | POA: Diagnosis present

## 2024-07-11 DIAGNOSIS — I10 Essential (primary) hypertension: Secondary | ICD-10-CM | POA: Diagnosis present

## 2024-07-11 DIAGNOSIS — K589 Irritable bowel syndrome without diarrhea: Secondary | ICD-10-CM | POA: Diagnosis present

## 2024-07-11 DIAGNOSIS — J9622 Acute and chronic respiratory failure with hypercapnia: Secondary | ICD-10-CM | POA: Diagnosis present

## 2024-07-11 DIAGNOSIS — F4000 Agoraphobia, unspecified: Secondary | ICD-10-CM | POA: Diagnosis present

## 2024-07-11 DIAGNOSIS — Z833 Family history of diabetes mellitus: Secondary | ICD-10-CM

## 2024-07-11 DIAGNOSIS — J189 Pneumonia, unspecified organism: Secondary | ICD-10-CM | POA: Diagnosis present

## 2024-07-11 DIAGNOSIS — T380X5A Adverse effect of glucocorticoids and synthetic analogues, initial encounter: Secondary | ICD-10-CM | POA: Diagnosis present

## 2024-07-11 DIAGNOSIS — J449 Chronic obstructive pulmonary disease, unspecified: Secondary | ICD-10-CM | POA: Diagnosis present

## 2024-07-11 DIAGNOSIS — E1165 Type 2 diabetes mellitus with hyperglycemia: Secondary | ICD-10-CM | POA: Diagnosis present

## 2024-07-11 DIAGNOSIS — F32A Depression, unspecified: Secondary | ICD-10-CM | POA: Diagnosis present

## 2024-07-11 DIAGNOSIS — E876 Hypokalemia: Secondary | ICD-10-CM | POA: Diagnosis present

## 2024-07-11 DIAGNOSIS — Z634 Disappearance and death of family member: Secondary | ICD-10-CM

## 2024-07-11 DIAGNOSIS — Z9981 Dependence on supplemental oxygen: Secondary | ICD-10-CM

## 2024-07-11 DIAGNOSIS — Z7982 Long term (current) use of aspirin: Secondary | ICD-10-CM

## 2024-07-11 LAB — BASIC METABOLIC PANEL WITH GFR
Anion gap: 14 (ref 5–15)
Anion gap: 12 (ref 5–15)
BUN: 10 mg/dL (ref 8–23)
BUN: 14 mg/dL (ref 8–23)
CO2: 23 mmol/L (ref 22–32)
CO2: 25 mmol/L (ref 22–32)
Calcium: 9.1 mg/dL (ref 8.9–10.3)
Calcium: 8.9 mg/dL (ref 8.9–10.3)
Chloride: 100 mmol/L (ref 98–111)
Chloride: 105 mmol/L (ref 98–111)
Creatinine, Ser: 0.8 mg/dL (ref 0.44–1.00)
Creatinine, Ser: 0.8 mg/dL (ref 0.44–1.00)
GFR, Estimated: >60 mL/min
GFR, Estimated: >60 mL/min
Glucose, Bld: 535 mg/dL (ref 70–99)
Glucose, Bld: 249 mg/dL — ABNORMAL HIGH (ref 70–99)
Potassium: 5.1 mmol/L (ref 3.5–5.1)
Potassium: 4.3 mmol/L (ref 3.5–5.1)
Sodium: 136 mmol/L (ref 135–145)
Sodium: 141 mmol/L (ref 135–145)

## 2024-07-11 LAB — I-STAT VENOUS BLOOD GAS, ED
Acid-Base Excess: 0 mmol/L (ref 0.0–2.0)
Acid-base deficit: 4 mmol/L — ABNORMAL HIGH (ref 0.0–2.0)
Bicarbonate: 26.3 mmol/L (ref 20.0–28.0)
Bicarbonate: 27 mmol/L (ref 20.0–28.0)
Calcium, Ion: 1.14 mmol/L — ABNORMAL LOW (ref 1.15–1.40)
Calcium, Ion: 1.08 mmol/L — ABNORMAL LOW (ref 1.15–1.40)
HCT: 44 % (ref 36.0–46.0)
HCT: 40 % (ref 36.0–46.0)
Hemoglobin: 15 g/dL (ref 12.0–15.0)
Hemoglobin: 13.6 g/dL (ref 12.0–15.0)
O2 Saturation: 98 %
O2 Saturation: 64 %
Potassium: 4.9 mmol/L (ref 3.5–5.1)
Potassium: 5 mmol/L (ref 3.5–5.1)
Sodium: 138 mmol/L (ref 135–145)
Sodium: 138 mmol/L (ref 135–145)
TCO2: 28 mmol/L (ref 22–32)
TCO2: 29 mmol/L (ref 22–32)
pCO2, Ven: 68.8 mmHg — ABNORMAL HIGH (ref 44–60)
pCO2, Ven: 54.4 mmHg (ref 44–60)
pH, Ven: 7.19 — CL (ref 7.25–7.43)
pH, Ven: 7.304 (ref 7.25–7.43)
pO2, Ven: 142 mmHg — ABNORMAL HIGH (ref 32–45)
pO2, Ven: 37 mmHg (ref 32–45)

## 2024-07-11 LAB — BETA-HYDROXYBUTYRIC ACID: Beta-Hydroxybutyric Acid: <0.05 mmol/L — ABNORMAL LOW (ref 0.05–0.27)

## 2024-07-11 LAB — CBC WITH DIFFERENTIAL/PLATELET
Abs Immature Granulocytes: 0.13 K/uL — ABNORMAL HIGH (ref 0.00–0.07)
Basophils Absolute: 0.2 K/uL — ABNORMAL HIGH (ref 0.0–0.1)
Basophils Relative: 1 %
Eosinophils Absolute: 0.5 K/uL (ref 0.0–0.5)
Eosinophils Relative: 2 %
HCT: 44.1 % (ref 36.0–46.0)
Hemoglobin: 13.9 g/dL (ref 12.0–15.0)
Immature Granulocytes: 1 %
Lymphocytes Relative: 33 %
Lymphs Abs: 6.4 K/uL — ABNORMAL HIGH (ref 0.7–4.0)
MCH: 29.4 pg (ref 26.0–34.0)
MCHC: 31.5 g/dL (ref 30.0–36.0)
MCV: 93.4 fL (ref 80.0–100.0)
Monocytes Absolute: 1.1 K/uL — ABNORMAL HIGH (ref 0.1–1.0)
Monocytes Relative: 6 %
Neutro Abs: 11.4 K/uL — ABNORMAL HIGH (ref 1.7–7.7)
Neutrophils Relative %: 57 %
Platelets: 492 K/uL — ABNORMAL HIGH (ref 150–400)
RBC: 4.72 MIL/uL (ref 3.87–5.11)
RDW: 13.7 % (ref 11.5–15.5)
WBC: 19.6 K/uL — ABNORMAL HIGH (ref 4.0–10.5)
nRBC: 0 % (ref 0.0–0.2)

## 2024-07-11 LAB — I-STAT CHEM 8, ED
BUN: 12 mg/dL (ref 8–23)
Calcium, Ion: 1.15 mmol/L (ref 1.15–1.40)
Chloride: 103 mmol/L (ref 98–111)
Creatinine, Ser: 0.7 mg/dL (ref 0.44–1.00)
Glucose, Bld: 543 mg/dL (ref 70–99)
HCT: 44 % (ref 36.0–46.0)
Hemoglobin: 15 g/dL (ref 12.0–15.0)
Potassium: 4.9 mmol/L (ref 3.5–5.1)
Sodium: 138 mmol/L (ref 135–145)
TCO2: 27 mmol/L (ref 22–32)

## 2024-07-11 LAB — PRO BRAIN NATRIURETIC PEPTIDE: Pro Brain Natriuretic Peptide: 854 pg/mL — ABNORMAL HIGH

## 2024-07-11 LAB — CBG MONITORING, ED
Glucose-Capillary: 192 mg/dL — ABNORMAL HIGH (ref 70–99)
Glucose-Capillary: 244 mg/dL — ABNORMAL HIGH (ref 70–99)
Glucose-Capillary: 251 mg/dL — ABNORMAL HIGH (ref 70–99)
Glucose-Capillary: 264 mg/dL — ABNORMAL HIGH (ref 70–99)
Glucose-Capillary: 370 mg/dL — ABNORMAL HIGH (ref 70–99)
Glucose-Capillary: 400 mg/dL — ABNORMAL HIGH (ref 70–99)

## 2024-07-11 LAB — TROPONIN T, HIGH SENSITIVITY
Troponin T High Sensitivity: 28 ng/L — ABNORMAL HIGH (ref 0–19)
Troponin T High Sensitivity: 91 ng/L — ABNORMAL HIGH (ref 0–19)
Troponin T High Sensitivity: 168 ng/L (ref 0–19)

## 2024-07-11 LAB — RESP PANEL BY RT-PCR (RSV, FLU A&B, COVID)  RVPGX2
Influenza A by PCR: NEGATIVE
Influenza B by PCR: NEGATIVE
Resp Syncytial Virus by PCR: NEGATIVE
SARS Coronavirus 2 by RT PCR: NEGATIVE

## 2024-07-11 LAB — PROCALCITONIN: Procalcitonin: <0.1 ng/mL

## 2024-07-11 LAB — I-STAT CG4 LACTIC ACID, ED: Lactic Acid, Venous: 2.9 mmol/L (ref 0.5–1.9)

## 2024-07-11 MED ORDER — MELATONIN 3 MG PO TABS
3.0000 mg | ORAL_TABLET | Freq: Every day | ORAL | Status: AC
  Administered 2024-07-11 – 2024-07-16 (×5): 3 mg via ORAL
  Filled 2024-07-11 (×5): qty 1

## 2024-07-11 MED ORDER — DEXTROSE 50 % IV SOLN: 0.0000 mL | INTRAVENOUS | Status: AC | PRN

## 2024-07-11 MED ORDER — ALBUTEROL SULFATE (2.5 MG/3ML) 0.083% IN NEBU
10.0000 mg/h | INHALATION_SOLUTION | Freq: Once | RESPIRATORY_TRACT | Status: AC
  Administered 2024-07-11: 10 mg/h via RESPIRATORY_TRACT
  Filled 2024-07-11: qty 12

## 2024-07-11 MED ORDER — ATORVASTATIN CALCIUM 80 MG PO TABS
80.0000 mg | ORAL_TABLET | Freq: Every day | ORAL | Status: DC
  Administered 2024-07-12 – 2024-07-17 (×6): 80 mg via ORAL
  Filled 2024-07-11 (×3): qty 1
  Filled 2024-07-11: qty 2
  Filled 2024-07-11 (×2): qty 1

## 2024-07-11 MED ORDER — EZETIMIBE 10 MG PO TABS
10.0000 mg | ORAL_TABLET | Freq: Every day | ORAL | Status: DC
  Administered 2024-07-12 – 2024-07-17 (×6): 10 mg via ORAL
  Filled 2024-07-11 (×6): qty 1

## 2024-07-11 MED ORDER — ACETAMINOPHEN 650 MG RE SUPP: 650.0000 mg | Freq: Four times a day (QID) | RECTAL | Status: DC | PRN

## 2024-07-11 MED ORDER — BUDESONIDE 0.25 MG/2ML IN SUSP
0.2500 mg | Freq: Two times a day (BID) | RESPIRATORY_TRACT | Status: DC
  Administered 2024-07-11 – 2024-07-12 (×2): 0.25 mg via RESPIRATORY_TRACT
  Filled 2024-07-11 (×2): qty 2

## 2024-07-11 MED ORDER — CITALOPRAM HYDROBROMIDE 20 MG PO TABS
40.0000 mg | ORAL_TABLET | Freq: Every day | ORAL | Status: DC
  Administered 2024-07-12 – 2024-07-13 (×2): 40 mg via ORAL
  Filled 2024-07-11: qty 4
  Filled 2024-07-11: qty 2

## 2024-07-11 MED ORDER — INSULIN GLARGINE-YFGN 100 UNIT/ML ~~LOC~~ SOLN: 18.0000 [IU] | Freq: Every day | SUBCUTANEOUS | Status: DC

## 2024-07-11 MED ORDER — CLOPIDOGREL BISULFATE 75 MG PO TABS
75.0000 mg | ORAL_TABLET | Freq: Every day | ORAL | Status: DC
  Administered 2024-07-12 – 2024-07-17 (×6): 75 mg via ORAL
  Filled 2024-07-11 (×7): qty 1

## 2024-07-11 MED ORDER — ALBUTEROL SULFATE (2.5 MG/3ML) 0.083% IN NEBU
5.0000 mg | INHALATION_SOLUTION | RESPIRATORY_TRACT | Status: DC | PRN
  Administered 2024-07-11: 5 mg via RESPIRATORY_TRACT
  Filled 2024-07-11 (×2): qty 6

## 2024-07-11 MED ORDER — INSULIN GLARGINE-YFGN 100 UNIT/ML ~~LOC~~ SOLN
18.0000 [IU] | Freq: Every day | SUBCUTANEOUS | Status: DC
  Administered 2024-07-11 – 2024-07-12 (×2): 18 [IU] via SUBCUTANEOUS
  Filled 2024-07-11 (×3): qty 0.18

## 2024-07-11 MED ORDER — LACTATED RINGERS IV SOLN: INTRAVENOUS | Status: DC

## 2024-07-11 MED ORDER — INSULIN ASPART 100 UNIT/ML IJ SOLN: 0.0000 [IU] | Freq: Three times a day (TID) | INTRAMUSCULAR | Status: DC

## 2024-07-11 MED ORDER — BUPROPION HCL ER (SR) 150 MG PO TB12
150.0000 mg | ORAL_TABLET | Freq: Two times a day (BID) | ORAL | Status: AC
  Administered 2024-07-11 – 2024-07-17 (×12): 150 mg via ORAL
  Filled 2024-07-11 (×13): qty 1

## 2024-07-11 MED ORDER — ACETAMINOPHEN 325 MG PO TABS
650.0000 mg | ORAL_TABLET | Freq: Four times a day (QID) | ORAL | Status: DC | PRN
  Administered 2024-07-12 – 2024-07-15 (×4): 650 mg via ORAL
  Filled 2024-07-11 (×4): qty 2

## 2024-07-11 MED ORDER — INSULIN REGULAR(HUMAN) IN NACL 100-0.9 UT/100ML-% IV SOLN
INTRAVENOUS | Status: DC
  Administered 2024-07-11: 15 [IU]/h via INTRAVENOUS
  Filled 2024-07-11: qty 100

## 2024-07-11 MED ORDER — HYDROXYZINE HCL 25 MG PO TABS
25.0000 mg | ORAL_TABLET | Freq: Three times a day (TID) | ORAL | Status: AC | PRN
  Administered 2024-07-12 – 2024-07-16 (×7): 25 mg via ORAL
  Filled 2024-07-11 (×7): qty 1

## 2024-07-11 MED ORDER — MIDAZOLAM HCL (PF) 2 MG/2ML IJ SOLN
1.0000 mg | Freq: Once | INTRAMUSCULAR | Status: AC
  Administered 2024-07-11: 1 mg via INTRAVENOUS

## 2024-07-11 MED ORDER — AZITHROMYCIN 250 MG PO TABS
500.0000 mg | ORAL_TABLET | Freq: Every day | ORAL | Status: DC
  Administered 2024-07-12: 500 mg via ORAL
  Filled 2024-07-11: qty 2

## 2024-07-11 MED ORDER — PREDNISONE 20 MG PO TABS
40.0000 mg | ORAL_TABLET | Freq: Every day | ORAL | Status: DC
  Administered 2024-07-12: 40 mg via ORAL
  Filled 2024-07-11: qty 2

## 2024-07-11 MED ORDER — POLYETHYLENE GLYCOL 3350 17 G PO PACK: 17.0000 g | PACK | Freq: Every day | ORAL | Status: AC | PRN

## 2024-07-11 MED ORDER — ARFORMOTEROL TARTRATE 15 MCG/2ML IN NEBU
15.0000 ug | INHALATION_SOLUTION | Freq: Two times a day (BID) | RESPIRATORY_TRACT | Status: DC
  Administered 2024-07-11 – 2024-07-12 (×2): 15 ug via RESPIRATORY_TRACT
  Filled 2024-07-11 (×2): qty 2

## 2024-07-11 MED ORDER — GUAIFENESIN ER 600 MG PO TB12
600.0000 mg | ORAL_TABLET | Freq: Two times a day (BID) | ORAL | Status: AC
  Administered 2024-07-11 – 2024-07-17 (×12): 600 mg via ORAL
  Filled 2024-07-11 (×12): qty 1

## 2024-07-11 MED ORDER — IPRATROPIUM BROMIDE 0.02 % IN SOLN
0.5000 mg | Freq: Once | RESPIRATORY_TRACT | Status: AC
  Administered 2024-07-11: 0.5 mg via RESPIRATORY_TRACT
  Filled 2024-07-11: qty 2.5

## 2024-07-11 MED ORDER — DEXMEDETOMIDINE HCL IN NACL 400 MCG/100ML IV SOLN
0.0000 ug/kg/h | INTRAVENOUS | Status: DC
  Administered 2024-07-11: 0.4 ug/kg/h via INTRAVENOUS
  Filled 2024-07-11: qty 100

## 2024-07-11 MED ORDER — IPRATROPIUM-ALBUTEROL 0.5-2.5 (3) MG/3ML IN SOLN
3.0000 mL | RESPIRATORY_TRACT | Status: DC
  Administered 2024-07-11 – 2024-07-12 (×4): 3 mL via RESPIRATORY_TRACT
  Filled 2024-07-11 (×6): qty 3

## 2024-07-11 MED ORDER — IPRATROPIUM-ALBUTEROL 0.5-2.5 (3) MG/3ML IN SOLN: 3.0000 mL | Freq: Once | RESPIRATORY_TRACT | Status: DC

## 2024-07-11 MED ORDER — SODIUM CHLORIDE 0.9 % IV SOLN: 2.0000 g | INTRAVENOUS | Status: DC

## 2024-07-11 MED ORDER — SODIUM CHLORIDE 0.9 % IV SOLN
100.0000 mg | Freq: Two times a day (BID) | INTRAVENOUS | Status: DC
  Administered 2024-07-11: 100 mg via INTRAVENOUS
  Filled 2024-07-11: qty 100

## 2024-07-11 MED ORDER — ENOXAPARIN SODIUM 40 MG/0.4ML IJ SOSY
40.0000 mg | PREFILLED_SYRINGE | INTRAMUSCULAR | Status: DC
  Administered 2024-07-11: 40 mg via SUBCUTANEOUS
  Filled 2024-07-11: qty 0.4

## 2024-07-11 MED ORDER — ASPIRIN 81 MG PO TBEC
81.0000 mg | DELAYED_RELEASE_TABLET | Freq: Every day | ORAL | Status: DC
  Administered 2024-07-12 – 2024-07-17 (×5): 81 mg via ORAL
  Filled 2024-07-11 (×5): qty 1

## 2024-07-11 MED ORDER — FUROSEMIDE 10 MG/ML IJ SOLN
20.0000 mg | Freq: Once | INTRAMUSCULAR | Status: AC
  Administered 2024-07-11: 20 mg via INTRAVENOUS
  Filled 2024-07-11: qty 2

## 2024-07-11 MED ORDER — DEXTROSE IN LACTATED RINGERS 5 % IV SOLN: INTRAVENOUS | Status: DC

## 2024-07-11 MED ORDER — SODIUM CHLORIDE 0.9 % IV SOLN: 500.0000 mg | Freq: Once | INTRAVENOUS | Status: DC

## 2024-07-11 MED ORDER — SODIUM CHLORIDE 0.9 % IV SOLN
2.0000 g | Freq: Once | INTRAVENOUS | Status: AC
  Administered 2024-07-11: 2 g via INTRAVENOUS
  Filled 2024-07-11: qty 20

## 2024-07-11 NOTE — H&P (Cosign Needed)
 " Date: 07/11/2024               Patient Name:  Vanessa [name] MRN: [id number]  DOB: September 17, 1960 Age / Sex: 64 y.o., female   PCP: Jolaine Pac, DO         Medical Service: Internal Medicine Teaching Service         Attending Physician: Dr. Mliss Pouch      First Contact: Letha Cheadle, MD}    Second Contact: Dr. Missy Sandhoff, MD         Pager Information: First Contact Pager: [phone]   Second Contact Pager: [phone]   SUBJECTIVE   Chief Complaint: Acute shortness of breath  History of Present Illness: Vanessa [name] is a 64 y.o. female with PMH of COPD on home oxygen on 2 L (chronic hypoxic respiratory failure), asthma (no prior PFTs), T2DM on insulin  with poor control and non compliance, HTN on lisinopril , and recently started on coreg  a week ago, HLD on lipitor and zetia , severe PAD s/p revascularizations, depression/anxiety/panic attacks, who presents with acute respiratory distress.  Approximately 1 hour prior to ED arrival, patient developed sudden severe shortness of breath and called 911. Daughter at bedside reports she was more confused than baseline during the episode. Patient denies fever, chills, and recent URI symptoms. No chest pain today, though family reports intermittent chest pain over the past weeks and she is following with cardiology with planned cardiac cath. History limited initially due to respiratory distress.  Also, daughter in law mentioned that patient started to have some mild breathing problems started approximately a week ago.   Per collateral, she has COPD and has not been wearing home oxygen consistently. She is a former cigarette smoker (43 pack-years, quit Feb 2025) but continues to vape nicotine  daily.  ED Course: Labs significant for: Leukocytosis to 19.6, severe hyperglycemia (>500) without ketoacidosis, elevated BNP 854, and initial hypercapnic respiratory acidosis (VBG 7.19/68.8) that improved with treatment (7.3/54). Procalcitonin was low and  respiratory viral panel was negative.  Imaging: Chest X-ray showed diffuse interstitial changes with small bilateral pleural effusions and left lower lobe atelectasis, without focal consolidation.  Received: Required BiPAP for respiratory distress, along with continuous bronchodilators and systemic steroids. Started on insulin  drip for severe hyperglycemia and ceftriaxone /Doxy for possible pneumonia. PRECEDEX  ( Briefly) and VERSED  was used to help with anxiety and BiPAP tolerance and discontinued before IM admission.  Consulted: Internal Medicine for admissin  Meds:  Patient reported:  - Albuterol  inhaler as needed (RAN OUT) - Aspirin  81 mg Y - Atorvastatin  80 mg Y - Wellbutrin  150 mg twice daily Y - Coreg  6.25 mg twice daily Y - Citalopram  40 mg T - Plavix  75 mg Y - Zetia  10 mg T - Trelegy 200 daily T - Gabapentin  300 mg nightly NO - Lantus  36 daily Y - Humalog 6 units 3 times daily Y - Lisinopril  5 mg Y - Protonix  20 mg  NO - Mounjaro  2.5 mg NOT STARTED   Past Medical History COPD  Asthma Essential hypertension  HLD Anxiety and depression  Type 2 diabetes GERD Hyperlipidemia  PAD IBS Ovarian Cancer Tobacco use disorder   Past Surgical History Past Surgical History:  Procedure Laterality Date   ABDOMINAL AORTOGRAM W/LOWER EXTREMITY N/A 11/24/2022   Procedure: ABDOMINAL AORTOGRAM W/LOWER EXTREMITY;  Surgeon: Magda Debby SAILOR, MD;  Location: [facility];  Service: Cardiovascular;  Laterality: N/A;   ABDOMINAL AORTOGRAM W/LOWER EXTREMITY N/A 08/07/2023   Procedure: ABDOMINAL AORTOGRAM W/LOWER EXTREMITY;  Surgeon: Serene Gaile ORN, MD;  Location: [facility];  Service: Cardiovascular;  Laterality: N/A;   AMPUTATION Right 12/02/2022   Procedure: RAY AMPUTATION OF  RIGHT 5TH & 4TH TOES;  Surgeon: Malvin Marsa FALCON, DPM;  Location: [facility];  Service: Podiatry;  Laterality: Right;   APPLICATION OF WOUND VAC Right 11/29/2022   Procedure: APPLICATION OF WOUND  VAC AND KERECIS MICRO WOUND GRAFT;  Surgeon: Serene Gaile ORN, MD;  Location: [facility];  Service: Vascular;  Laterality: Right;   AXILLARY-FEMORAL BYPASS GRAFT Right 11/29/2022   Procedure: RIGHT AXILLA ARTERY - RIGHT FEMORAL ARTERY BYPASS GRAFT USING 8mm X 70cm PROPATEN GORE GRAFT WITH REMOVABLE RINGS;  Surgeon: Serene Gaile ORN, MD;  Location: [facility];  Service: Vascular;  Laterality: Right;   CESAREAN SECTION     with 3 children   COLONOSCOPY  2018   ENDARTERECTOMY FEMORAL Left 08/22/2023   Procedure: LEFT ENDARTERECTOMY FEMORAL;  Surgeon: Serene Gaile ORN, MD;  Location: [facility];  Service: Vascular;  Laterality: Left;   FEMORAL-POPLITEAL BYPASS GRAFT Left 08/22/2023   Procedure: LEFT BYPASS GRAFT FEMORAL-POPLITEAL ARTERY;  Surgeon: Serene Gaile ORN, MD;  Location: [facility];  Service: Vascular;  Laterality: Left;   INSERTION OF ILIAC STENT Left 08/22/2023   Procedure: INSERTION OF ILIAC STENT - LEFT;  Surgeon: Serene Gaile ORN, MD;  Location: [facility];  Service: Vascular;  Laterality: Left;   OVARIAN CYST REMOVAL     Age 44   PERIPHERAL VASCULAR INTERVENTION Left 08/07/2023   Procedure: PERIPHERAL VASCULAR INTERVENTION;  Surgeon: Serene Gaile ORN, MD;  Location: [facility];  Service: Cardiovascular;  Laterality: Left;  Iliac   ROBOTIC ASSISTED BILATERAL SALPINGO OOPHERECTOMY Bilateral 11/23/2021   Procedure: XI ROBOTIC ASSISTED BILATERAL SALPINGO OOPHORECTOMY,  MINI LAPAROTOMY;  Surgeon: Viktoria Comer SAUNDERS, MD;  Location: [facility];  Service: Gynecology;  Laterality: Bilateral;   ROBOTIC ASSISTED TOTAL HYSTERECTOMY N/A 11/23/2021   Procedure: XI ROBOTIC ASSISTED TOTAL HYSTERECTOMY, STAGING, CYSTOSCOPY;  Surgeon: Viktoria Comer SAUNDERS, MD;  Location: [facility];  Service: Gynecology;  Laterality: N/A;   TUBAL LIGATION     UPPER GASTROINTESTINAL ENDOSCOPY  2018     Social:  Lives With: Lives with family (sons home in [place]) Occupation: Not currently working / retired Support: Strong family support;  daughter and sons involved in care Level of Function: Independent at baseline; ambulates without assistive devices PCP: Jolaine Pac, DO Substances: Tobacco: Former cigarette smoker (43 pack-years), quit ~1 year ago; currently vapes nicotine  daily Alcohol: Denies use Recreational Drug: Denies use  Family History:  Family History  Problem Relation Age of Onset   Colon polyps Mother        benign   Ovarian cancer Mother 54   Heart attack Brother    Breast cancer Maternal Aunt        dx late 55s   Diabetes Maternal Uncle    Colon cancer Neg Hx    Stomach cancer Neg Hx    Endometrial cancer Neg Hx    Prostate cancer Neg Hx    Pancreatic cancer Neg Hx    Esophageal cancer Neg Hx    Rectal cancer Neg Hx      Allergies: Allergies as of 07/11/2024 - Review Complete 07/09/2024  Allergen Reaction Noted   Jardiance  [empagliflozin ] Other (See Comments) 03/10/2018   Latex Rash 05/11/2020   Avandia [rosiglitazone] Other (See Comments) 06/14/2006    Review of Systems: A complete ROS was negative except as per HPI.   OBJECTIVE:  Physical Exam: Blood pressure 124/62, pulse (!) 107, temperature (!) 96.4 F (35.8 C), temperature source Axillary, resp. rate (!) 22, last menstrual period 12/24/2013, SpO2 (6 on HFO.  General: Ill-appearing female, initially in moderate to severe respiratory distress, now improved and resting more comfortably. HEENT: Normocephalic, atraumatic. Mucous membranes moist. Neck: Supple, no JVD appreciated due to body habitus Cardiovascular: Tachycardic, regular rhythm. No murmurs, rubs, or gallops. Pulmonary: Diminished breath sounds bilaterally with mild scattered expiratory wheezes. Improved air movement compared to arrival. No focal crackles appreciated. Abdomen: Soft, non-tender, non-distended. Extremities: Trace to 1+ bilateral lower extremity edema. Warm and well perfused. 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 No focal deficits. Skin: Warm, intact. Psychiatric: Anxious but cooperative; improved with treatment.  Labs: CBC    Component Value Date/Time   WBC 19.6 (H) 07/11/2024 1755   RBC 4.72 07/11/2024 1755   HGB 13.6 07/11/2024 1934   HGB 14.2 07/01/2024 1054   HCT 40.0 07/11/2024 1934   HCT 42.1 07/01/2024 1054   PLT 492 (H) 07/11/2024 1755   PLT 461 (H) 07/01/2024 1054   MCV 93.4 07/11/2024 1755   MCV 87 07/01/2024 1054   MCH 29.4 07/11/2024 1755   MCHC 31.5 07/11/2024 1755   RDW 13.7 07/11/2024 1755   RDW 13.0 07/01/2024 1054   LYMPHSABS 6.4 (H) 07/11/2024 1755   LYMPHSABS 1.9 07/01/2024 1054   MONOABS 1.1 (H) 07/11/2024 1755   EOSABS 0.5 07/11/2024 1755   EOSABS 0.4 07/01/2024 1054   BASOSABS 0.2 (H) 07/11/2024 1755   BASOSABS 0.1 07/01/2024 1054     CMP     Component Value Date/Time   NA 138 07/11/2024 1934   NA 134 07/01/2024 1054   K 5.0 07/11/2024 1934   CL 103 07/11/2024 1819   CO2 23 07/11/2024 1755   GLUCOSE 543 (HH) 07/11/2024 1819   BUN 12 07/11/2024 1819   BUN 7 (L) 07/01/2024 1054   CREATININE 0.70 07/11/2024 1819   CREATININE 0.56 11/04/2021 1019   CREATININE 0.45 (L) 12/03/2014 1542   CALCIUM  9.1 07/11/2024 1755   PROT 6.5 07/01/2024 1054   ALBUMIN 4.0 07/01/2024 1054   AST 21 07/01/2024 1054   ALT 10 07/01/2024 1054   ALKPHOS 132 07/01/2024 1054   BILITOT 0.4 07/01/2024 1054   GFRNONAA >60 07/11/2024 1755   GFRNONAA >60 11/04/2021 1019   GFRNONAA >89 12/03/2014 1542   GFRAA 125 03/11/2020 1644   GFRAA >89 12/03/2014 1542    Imaging:  DG Chest Port 1 View Result Date: 07/11/2024 EXAM: 1 VIEW(S) XRAY OF THE CHEST 07/11/2024 06:10:53 PM COMPARISON: None available. CLINICAL HISTORY: sob FINDINGS: LUNGS AND PLEURA: Small bilateral pleural effusions. Diffuse interstitial prominence, consistent with possible pulmonary edema or atypical/viral infection. Left lung base atelectasis. No focal pulmonary opacity. No pneumothorax. HEART AND  MEDIASTINUM: Heart size at upper limits of normal. Aortic atherosclerosis. BONES AND SOFT TISSUES: No acute osseous abnormality. IMPRESSION: 1. Diffuse interstitial prominence, which may reflect pulmonary edema or atypical/viral infection. 2. Small bilateral pleural effusions. 3. Left lung base atelectasis. Electronically signed by: Greig Pique MD 07/11/2024 06:45 PM EST RP Workstation: [id number]     EKG: personally reviewed my interpretation is Sinus Tachycardia and Q waves in V1. Prior EKG in 2024 with no old infarct signs (Q wave)  ASSESSMENT & PLAN:   Assessment & Plan by Problem: Principal Problem:   COPD exacerbation (HCC) Active Problems:   Diabetes mellitus, type II, insulin  dependent (HCC)  Tobacco dependence   COPD (chronic obstructive pulmonary disease) (HCC)   Essential hypertension, benign   Pneumonia   Hyperglycemia   Asthma with acute exacerbation   Elevated brain natriuretic peptide (BNP) level   Generalized anxiety disorder   Vanessa [name] is a 64 y.o. person living with COPD on home oxygen (2 Liter), insulin -dependent diabetes, peripheral arterial disease, and anxiety who presented with acute respiratory distress and was admitted for acute hypercapnic respiratory failure from a COPD exacerbation with concern for a concurrent cardiopulmonary process on hospital day 0.  #Acute on chronic hypercapnic respiratory failure due to COPD/Asthma exacerbation with mixed pulmonary picture (possible infection vs pulmonary edema) #COPD on home oxygen (2 L) #Possible CAP vs atypical infection #Elevated BNP / concern for cardiac contribution to dyspnea/ ? Hx of HFpEF The patient presented with acute severe dyspnea and transient confusion, found to have marked hypercapnic respiratory acidosis (VBG PH:7.2 / pCO2:68), consistent with acute on chronic obstructive lung disease exacerbation. Her rapid improvement in gas exchange, mental status, and work of breathing after BiPAP,  bronchodilators, and systemic steroids strongly supports COPD exacerbation as the primary driver. Chest imaging demonstrates diffuse interstitial prominence with small bilateral pleural effusions and left lower lobe atelectasis, without focal consolidation. While leukocytosis is present, procalcitonin <0.1 and a negative respiratory viral panel make classic bacterial pneumonia less likely, though early or atypical infection cannot be fully excluded given severity of presentation. Additionally, BNP is elevated (854) and pleural effusions are present, raising concern for a concurrent cardiogenic or volume-related component contributing to her dyspnea. (Last Echo in 2024: EF: 50- 55%)  Overall, this represents a multifactorial respiratory decompensation, with COPD exacerbation as the dominant process, potentially worsened by infection, volume overload, medication nonadherence, vaping, and anxiety.  Plan: - BiPAP PRN per RT, wean as tolerated - HFNC / [place] with SpO2 goal 88-92% - DC ceftriaxone  (Afebrile, very low procal, Chest xray more consistent with effusion/viral) - Start azithromycin  500 mg from tomorrow for 2 days (took Doxy today due to back order) - Duonebs q4h scheduled  - Brovana  BID  - Pulmicort  neb BID - Prednisone  40 mg daily 4 days - Incentive spirometry, flutter valve - Mucinex  - Atarax  25 mg TID PRN - Repeat VBG if worsening mental status or work of breathing - Strict I/O and daily weights - Transthoracic echocardiogram (already ordered) - Telemetry monitoring - Daily weights and volume assessment - IV lasix  20 mg once (Monitor BMP for K>4 and Mg>2)  # Severe hyperglycemia (stress/steroid-induced) without DKA # Uncontrolled T2DM on Insulin  Therapy with Peripheral Neuropathy  Last A1c: 12.4; Patient presented with glucose >500 mg/dL, without evidence of DKA (normal beta-hydroxybutyrate, near-normal anion gap and bicarbonate). Hyperglycemia is likely multifactorial, driven by  stress response, steroid use, and baseline poorly controlled diabetes. Glucose has improved appropriately on insulin  infusion. Plan: - DC IV EndoTool with goal 140-180 - Administer Semglee  18 units daily (half of home dose due to NPO) - Resistant SSI with meals - Carb-modified diet once no longer NPO  # High CAD risk with recent anginal symptoms/rule out ACS Although she denies chest pain today, she has significant vascular disease, recent anginal symptoms, and is scheduled for cardiac catheterization. Given severity of illness and elevated BNP, ACS must be excluded. Admission EKG did not show acute ischemic changes.  Plan: - F/U Troponin  - Continue aspirin , Plavix , Lipitor, ezetimibe  - Telemetry monitoring  # Anxiety / panic contributing to respiratory distress Anxiety appears to have significantly worsened her dyspnea  and interfered with BiPAP tolerance, necessitating dexmedetomidine  in the ED. Improved anxiety correlated with improved respiratory mechanics. Plan: - Hydroxyzine  25 mg TID PRN - Melatonin at bedtime - Continue Citalopram  - Continue home Wellbutrin   - Avoid benzodiazepines if possible due to hypercapnia risk - Reassurance, family presence, breathing coaching  #Peripheral Arterial Disease (s/p Multiple Revascularizations) Severe PAD with extensive surgical history. Currently stable without acute ischemic symptoms. Plan: - Continue aspirin  and clopidogrel  - Continue Lipitor 80 and ezetimibe   #Hypertension Chronic HTN, currently controlled but labile during acute illness. Plan: - Hold lisinopril  and carvedilol   - Monitor BP closely during hospitalization  #Hyperlipidemia Longstanding dyslipidemia with high ASCVD risk. Plan: - Continue atorvastatin  80 mg daily - Continue ezetimibe   #Tobacco Use Disorder (in remission) / Vaping Former heavy smoker; currently vaping daily, contributing to pulmonary disease. Plan: - Strong counseling on vaping cessation -  Continue bupropion  for smoking cessation support   Best practice: Diet: NPO VTE: Enoxaparin  IVF: None,None Code: Full  Disposition planning: Prior to Admission Living Arrangement: Home, living   Anticipated Discharge Location: Home  Dispo: Admit patient to Observation with expected length of stay less than 2 midnights.  Signed: Bernadine Manos, MD Internal Medicine Resident  07/11/2024, 8:18 PM  On Call pager: [phone]   "

## 2024-07-11 NOTE — Progress Notes (Signed)
 Pt placed on HHFNC, tolerating well at this time RT will follow up if needed.

## 2024-07-11 NOTE — Progress Notes (Signed)
 Pt removed from BiPAP per MD and then placed on 4L [place]. Pt tolerating well at this time.

## 2024-07-11 NOTE — ED Provider Notes (Signed)
 " [place] [facility] Provider Note   CSN: [id number] Arrival date & time: 07/11/24  1754     Patient presents with: No chief complaint on file.   Vanessa [name] is a 64 y.o. female.  {Add pertinent medical, surgical, social history, OB history to HPI:6655} 64 year old female history of COPD on home oxygen, diabetes, hypertension, hyperlipidemia, and PAD who presents to the emergency department in respiratory distress.  An hour prior to arrival patient called 911 for significant shortness of breath.  Daughter is at the bedside reports that she seems more confused than usual.  No recent illnesses.  Does have COPD and has not been wearing her oxygen consistently.  No leg swelling.  Family reports that she has intermittently been having chest pain and is following with cardiology about this.  No chest pain today.  History limited due to respiratory distress  EMS gave 125 Solu-Medrol , magnesium , 0.3 mg of epinephrine, and DuoNeb       Prior to Admission medications  Medication Sig Start Date End Date Taking? Authorizing Provider  Insulin  Pen Needle (PEN NEEDLES) 32G X 4 MM MISC Use 3-4x a day while on insulin  therapy 07/04/24   Jolaine Pac, DO  acetaminophen  (TYLENOL ) 650 MG CR tablet Take 1,300 mg by mouth every 8 (eight) hours as needed for pain.    [provider]  albuterol  (VENTOLIN  HFA) 108 (90 Base) MCG/ACT inhaler Inhale 2 puffs into the lungs every 6 (six) hours as needed. 07/04/24   Leontine Lapine, MD  aspirin  EC 81 MG tablet Take 1 tablet (81 mg total) by mouth daily at 6 (six) AM. Swallow whole. 12/06/22   Jolaine Pac, DO  atorvastatin  (LIPITOR) 80 MG tablet Take 1 tablet (80 mg total) by mouth daily. Patient taking differently: Take 40 mg by mouth daily. 12/13/23 07/09/24  Jolaine Pac, DO  buPROPion  (WELLBUTRIN  SR) 150 MG 12 hr tablet Take 1 tablet (150 mg total) by mouth 2 (two) times daily. 06/02/24   Nguyen, Diana, MD  carvedilol   (COREG ) 6.25 MG tablet Take 1 tablet (6.25 mg total) by mouth 2 (two) times daily. 07/01/24   Jordan, Peter M, MD  cetirizine  (ZYRTEC ) 10 MG tablet Take 10 mg by mouth daily as needed for allergies.    [provider]  citalopram  (CELEXA ) 40 MG tablet Take 1 tablet (40 mg total) by mouth daily. 12/13/23   Jolaine Pac, DO  clopidogrel  (PLAVIX ) 75 MG tablet Take 1 tablet (75 mg total) by mouth daily. 06/02/24   Leontine Lapine, MD  Continuous Glucose Receiver (DEXCOM G7 RECEIVER) DEVI Use with Dexcom G7 sensors to monitor your glucose continuously 07/03/24   Jolaine Pac, DO  Continuous Glucose Sensor (DEXCOM G7 SENSOR) MISC Place new sensor every 10 days. Use to monitor blood sugar continuously. 07/04/24   Jolaine Pac, DO  ezetimibe  (ZETIA ) 10 MG tablet Take 1 tablet (10 mg total) by mouth daily. 12/13/23   Jolaine Pac, DO  Fluticasone -Umeclidin-Vilant (TRELEGY ELLIPTA ) 200-62.5-25 MCG/ACT AEPB Inhale 1 puff into the lungs daily. 07/03/24   Jolaine Pac, DO  gabapentin  (NEURONTIN ) 300 MG capsule Take 1 capsule (300 mg total) by mouth at bedtime. Patient not taking: Reported on 07/09/2024 12/13/23 07/01/24  Jolaine Pac, DO  glucose blood test strip Use as instructed 10/12/16   Rosan Raisin Ratliff, DO  ibuprofen  (ADVIL ) 200 MG tablet Take 400 mg by mouth every 8 (eight) hours as needed for mild pain (pain score 1-3) or moderate pain (pain  score 4-6).    [provider]  Insulin  Glargine (BASAGLAR  KWIKPEN) 100 UNIT/ML Inject 36 Units into the skin daily. 03/18/24   Jolaine Pac, DO  insulin  lispro (HUMALOG) 100 UNIT/ML injection Inject 6 Units into the skin 3 (three) times daily before meals.    [provider]  Insulin  Pen Needle (PEN NEEDLES) 31G X 5 MM MISC 1 each by Does not apply route daily. Use to inject insulin  08/09/22   Jolaine Pac, DO  lisinopril  (ZESTRIL ) 5 MG tablet Take 1 tablet (5 mg total) by mouth daily. 03/18/24   Jolaine Pac, DO   nitroGLYCERIN  (NITROSTAT ) 0.4 MG SL tablet Place 1 tablet (0.4 mg total) under the tongue every 5 (five) minutes as needed for chest pain. 07/01/24 09/29/24  Jordan, Peter M, MD  pantoprazole  (PROTONIX ) 20 MG tablet Take 1 tablet (20 mg total) by mouth daily. Patient taking differently: Take 20 mg by mouth daily as needed for heartburn or indigestion. 06/02/24 06/02/25  Nguyen, Diana, MD  tirzepatide  (MOUNJARO ) 2.5 MG/0.5ML Pen Inject 2.5 mg into the skin once a week. 07/03/24   Jolaine Pac, DO    Allergies: Jardiance  [empagliflozin ], Latex, and Avandia [rosiglitazone]    Review of Systems  Updated Vital Signs LMP 12/24/2013   Physical Exam Vitals and nursing note reviewed.  Constitutional:      General: She is in acute distress.     Appearance: She is well-developed. She is ill-appearing.  HENT:     Head: Normocephalic and atraumatic.     Right Ear: External ear normal.     Left Ear: External ear normal.     Nose: Nose normal.  Eyes:     Extraocular Movements: Extraocular movements intact.     Conjunctiva/sclera: Conjunctivae normal.     Pupils: Pupils are equal, round, and reactive to light.  Cardiovascular:     Rate and Rhythm: Regular rhythm. Tachycardia present.     Heart sounds: No murmur heard. Pulmonary:     Effort: Pulmonary effort is normal. No respiratory distress.     Comments: Diminished bilaterally Musculoskeletal:     Cervical back: Normal range of motion and neck supple.     Right lower leg: Edema (1+) present.     Left lower leg: Edema (1+) present.  Skin:    General: Skin is warm and dry.  Neurological:     Mental Status: She is alert and oriented to person, place, and time. Mental status is at baseline.  Psychiatric:        Mood and Affect: Mood normal.     (all labs ordered are listed, but only abnormal results are displayed) Labs Reviewed  RESP PANEL BY RT-PCR (RSV, FLU A&B, COVID)  [id number]  BASIC METABOLIC PANEL WITH GFR  CBC WITH  DIFFERENTIAL/PLATELET  PRO BRAIN NATRIURETIC PEPTIDE  I-STAT CHEM 8, ED  TROPONIN T, HIGH SENSITIVITY    EKG: None  Radiology: No results found.  {Document cardiac monitor, telemetry assessment procedure when appropriate:32947} Procedures   Medications Ordered in the ED  midazolam  PF (VERSED ) injection 1 mg (has no administration in time range)  albuterol  (PROVENTIL ,VENTOLIN ) solution continuous neb (has no administration in time range)  ipratropium (ATROVENT ) nebulizer solution 0.5 mg (has no administration in time range)    Clinical Course as of 07/11/24 1758  Fri Jul 11, 2024  1758 Full code verified with daughter [RP]    Clinical Course User Index [RP] Yolande Lamar BROCKS, MD   {Click here for ABCD2, HEART and other  calculators REFRESH Note before signing:1}                              Medical Decision Making Amount and/or Complexity of Data Reviewed Labs: ordered. Radiology: ordered.  Risk Prescription drug management.   ***  {Document critical care time when appropriate  Document review of labs and clinical decision tools ie CHADS2VASC2, etc  Document your independent review of radiology images and any outside records  Document your discussion with family members, caretakers and with consultants  Document social determinants of health affecting pt's care  Document your decision making why or why not admission, treatments were needed:32947:::1}   Final diagnoses:  None    ED Discharge Orders     None        "

## 2024-07-11 NOTE — H&P (Incomplete)
 " Date: 07/11/2024               Patient Name:  Vanessa [name] MRN: [id number]  DOB: 1960/08/21 Age / Sex: 64 y.o., female   PCP: Jolaine Pac, DO         Medical Service: Internal Medicine Teaching Service         Attending Physician: Dr. Mliss Pouch      First Contact: Letha Cheadle, MD}    Second Contact: Dr. Missy Sandhoff, MD         Pager Information: First Contact Pager: [phone]   Second Contact Pager: [phone]   SUBJECTIVE   Chief Complaint: Acute shortness of breath  History of Present Illness: Vanessa [name] is a 64 y.o. female with PMH of COPD on home oxygen on 2 L (chronic hypoxic respiratory failure), asthma (no prior PFTs), T2DM on insulin  with poor control and non compliance, HTN on lisinopril , and recently started on coreg  a week ago, HLD on lipitor and zetia , severe PAD s/p revascularizations, depression/anxiety/panic attacks, who presents with acute respiratory distress.  Approximately 1 hour prior to ED arrival, patient developed sudden severe shortness of breath and called 911. Daughter at bedside reports she was more confused than baseline during the episode. Patient denies fever, chills, and recent URI symptoms. No chest pain today, though family reports intermittent chest pain over the past weeks and she is following with cardiology with planned cardiac cath. History limited initially due to respiratory distress.  Also, daughter in law mentioned that patient started to have some mild breathing problems started approximately a week ago.   Per collateral, she has COPD and has not been wearing home oxygen consistently. She is a former cigarette smoker (43 pack-years, quit Feb 2025) but continues to vape nicotine  daily.  ED Course: Labs significant for: Leukocytosis to 19.6, severe hyperglycemia (>500) without ketoacidosis, elevated BNP 854, and initial hypercapnic respiratory acidosis (VBG 7.19/68.8) that improved with treatment (7.3/54). Procalcitonin was low and  respiratory viral panel was negative.  Imaging: Chest X-ray showed diffuse interstitial changes with small bilateral pleural effusions and left lower lobe atelectasis, without focal consolidation.  Received: Required BiPAP for respiratory distress, along with continuous bronchodilators and systemic steroids. Started on insulin  drip for severe hyperglycemia and ceftriaxone /Doxy for possible pneumonia. PRECEDEX  ( Briefly) and VERSED  was used to help with anxiety and BiPAP tolerance and discontinued before IM admission.  Consulted: Internal Medicine for admissin  Meds:  Patient reported:  - Albuterol  inhaler as needed (RAN OUT) - Aspirin  81 mg Y - Atorvastatin  80 mg Y - Wellbutrin  150 mg twice daily Y - Coreg  6.25 mg twice daily Y - Citalopram  40 mg T - Plavix  75 mg Y - Zetia  10 mg T - Trelegy 200 daily T - Gabapentin  300 mg nightly NO - Lantus  36 daily Y - Humalog 6 units 3 times daily Y - Lisinopril  5 mg Y - Protonix  20 mg  NO - Mounjaro  2.5 mg NOT STARTED   Past Medical History COPD  Asthma Essential hypertension  HLD Anxiety and depression  Type 2 diabetes GERD Hyperlipidemia  PAD IBS Ovarian Cancer Tobacco use disorder   Past Surgical History Past Surgical History:  Procedure Laterality Date   ABDOMINAL AORTOGRAM W/LOWER EXTREMITY N/A 11/24/2022   Procedure: ABDOMINAL AORTOGRAM W/LOWER EXTREMITY;  Surgeon: Magda Debby SAILOR, MD;  Location: [facility];  Service: Cardiovascular;  Laterality: N/A;   ABDOMINAL AORTOGRAM W/LOWER EXTREMITY N/A 08/07/2023   Procedure: ABDOMINAL AORTOGRAM W/LOWER EXTREMITY;  Surgeon: Serene Gaile ORN, MD;  Location: [facility];  Service: Cardiovascular;  Laterality: N/A;   AMPUTATION Right 12/02/2022   Procedure: RAY AMPUTATION OF  RIGHT 5TH & 4TH TOES;  Surgeon: Malvin Marsa FALCON, DPM;  Location: [facility];  Service: Podiatry;  Laterality: Right;   APPLICATION OF WOUND VAC Right 11/29/2022   Procedure: APPLICATION OF  WOUND VAC AND KERECIS MICRO WOUND GRAFT;  Surgeon: Serene Gaile ORN, MD;  Location: [facility];  Service: Vascular;  Laterality: Right;   AXILLARY-FEMORAL BYPASS GRAFT Right 11/29/2022   Procedure: RIGHT AXILLA ARTERY - RIGHT FEMORAL ARTERY BYPASS GRAFT USING 8mm X 70cm PROPATEN GORE GRAFT WITH REMOVABLE RINGS;  Surgeon: Serene Gaile ORN, MD;  Location: [facility];  Service: Vascular;  Laterality: Right;   CESAREAN SECTION     with 3 children   COLONOSCOPY  2018   ENDARTERECTOMY FEMORAL Left 08/22/2023   Procedure: LEFT ENDARTERECTOMY FEMORAL;  Surgeon: Serene Gaile ORN, MD;  Location: [facility];  Service: Vascular;  Laterality: Left;   FEMORAL-POPLITEAL BYPASS GRAFT Left 08/22/2023   Procedure: LEFT BYPASS GRAFT FEMORAL-POPLITEAL ARTERY;  Surgeon: Serene Gaile ORN, MD;  Location: [facility];  Service: Vascular;  Laterality: Left;   INSERTION OF ILIAC STENT Left 08/22/2023   Procedure: INSERTION OF ILIAC STENT - LEFT;  Surgeon: Serene Gaile ORN, MD;  Location: [facility];  Service: Vascular;  Laterality: Left;   OVARIAN CYST REMOVAL     Age 89   PERIPHERAL VASCULAR INTERVENTION Left 08/07/2023   Procedure: PERIPHERAL VASCULAR INTERVENTION;  Surgeon: Serene Gaile ORN, MD;  Location: [facility];  Service: Cardiovascular;  Laterality: Left;  Iliac   ROBOTIC ASSISTED BILATERAL SALPINGO OOPHERECTOMY Bilateral 11/23/2021   Procedure: XI ROBOTIC ASSISTED BILATERAL SALPINGO OOPHORECTOMY,  MINI LAPAROTOMY;  Surgeon: Viktoria Comer SAUNDERS, MD;  Location: [facility];  Service: Gynecology;  Laterality: Bilateral;   ROBOTIC ASSISTED TOTAL HYSTERECTOMY N/A 11/23/2021   Procedure: XI ROBOTIC ASSISTED TOTAL HYSTERECTOMY, STAGING, CYSTOSCOPY;  Surgeon: Viktoria Comer SAUNDERS, MD;  Location: [facility];  Service: Gynecology;  Laterality: N/A;   TUBAL LIGATION     UPPER GASTROINTESTINAL ENDOSCOPY  2018     Social:  Lives With: Lives with family (sons home in [place]) Occupation: Not currently working / retired Support: Strong  family support; daughter and sons involved in care Level of Function: Independent at baseline; ambulates without assistive devices PCP: Jolaine Pac, DO Substances: Tobacco: Former cigarette smoker (43 pack-years), quit ~1 year ago; currently vapes nicotine  daily Alcohol: Denies use Recreational Drug: Denies use  Family History:  Family History  Problem Relation Age of Onset   Colon polyps Mother        benign   Ovarian cancer Mother 62   Heart attack Brother    Breast cancer Maternal Aunt        dx late 35s   Diabetes Maternal Uncle    Colon cancer Neg Hx    Stomach cancer Neg Hx    Endometrial cancer Neg Hx    Prostate cancer Neg Hx    Pancreatic cancer Neg Hx    Esophageal cancer Neg Hx    Rectal cancer Neg Hx      Allergies: Allergies as of 07/11/2024 - Review Complete 07/09/2024  Allergen Reaction Noted   Jardiance  [empagliflozin ] Other (See Comments) 03/10/2018   Latex Rash 05/11/2020   Avandia [rosiglitazone] Other (See Comments) 06/14/2006    Review of Systems: A complete ROS was negative except as per HPI.   OBJECTIVE:  Physical Exam: Blood pressure 124/62, pulse (!) 107, temperature (!) 96.4 F (35.8 C), temperature source Axillary, resp. rate (!) 22, last menstrual period 12/24/2013, SpO2 (6 on HFO.  General: Ill-appearing female, initially in moderate to severe respiratory distress, now improved and resting more comfortably. HEENT: Normocephalic, atraumatic. Mucous membranes moist. Neck: Supple, no JVD appreciated due to body habitus Cardiovascular: Tachycardic, regular rhythm. No murmurs, rubs, or gallops. Pulmonary: Diminished breath sounds bilaterally with mild scattered expiratory wheezes. Improved air movement compared to arrival. No focal crackles appreciated. Abdomen: Soft, non-tender, non-distended. Extremities: Trace to 1+ bilateral lower extremity edema. Warm and well perfused. 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 No focal deficits. Skin: Warm, intact. Psychiatric: Anxious but cooperative; improved with treatment.  Labs: CBC    Component Value Date/Time   WBC 19.6 (H) 07/11/2024 1755   RBC 4.72 07/11/2024 1755   HGB 13.6 07/11/2024 1934   HGB 14.2 07/01/2024 1054   HCT 40.0 07/11/2024 1934   HCT 42.1 07/01/2024 1054   PLT 492 (H) 07/11/2024 1755   PLT 461 (H) 07/01/2024 1054   MCV 93.4 07/11/2024 1755   MCV 87 07/01/2024 1054   MCH 29.4 07/11/2024 1755   MCHC 31.5 07/11/2024 1755   RDW 13.7 07/11/2024 1755   RDW 13.0 07/01/2024 1054   LYMPHSABS 6.4 (H) 07/11/2024 1755   LYMPHSABS 1.9 07/01/2024 1054   MONOABS 1.1 (H) 07/11/2024 1755   EOSABS 0.5 07/11/2024 1755   EOSABS 0.4 07/01/2024 1054   BASOSABS 0.2 (H) 07/11/2024 1755   BASOSABS 0.1 07/01/2024 1054     CMP     Component Value Date/Time   NA 138 07/11/2024 1934   NA 134 07/01/2024 1054   K 5.0 07/11/2024 1934   CL 103 07/11/2024 1819   CO2 23 07/11/2024 1755   GLUCOSE 543 (HH) 07/11/2024 1819   BUN 12 07/11/2024 1819   BUN 7 (L) 07/01/2024 1054   CREATININE 0.70 07/11/2024 1819   CREATININE 0.56 11/04/2021 1019   CREATININE 0.45 (L) 12/03/2014 1542   CALCIUM  9.1 07/11/2024 1755   PROT 6.5 07/01/2024 1054   ALBUMIN 4.0 07/01/2024 1054   AST 21 07/01/2024 1054   ALT 10 07/01/2024 1054   ALKPHOS 132 07/01/2024 1054   BILITOT 0.4 07/01/2024 1054   GFRNONAA >60 07/11/2024 1755   GFRNONAA >60 11/04/2021 1019   GFRNONAA >89 12/03/2014 1542   GFRAA 125 03/11/2020 1644   GFRAA >89 12/03/2014 1542    Imaging:  DG Chest Port 1 View Result Date: 07/11/2024 EXAM: 1 VIEW(S) XRAY OF THE CHEST 07/11/2024 06:10:53 PM COMPARISON: None available. CLINICAL HISTORY: sob FINDINGS: LUNGS AND PLEURA: Small bilateral pleural effusions. Diffuse interstitial prominence, consistent with possible pulmonary edema or atypical/viral infection. Left lung base atelectasis. No focal pulmonary opacity. No  pneumothorax. HEART AND MEDIASTINUM: Heart size at upper limits of normal. Aortic atherosclerosis. BONES AND SOFT TISSUES: No acute osseous abnormality. IMPRESSION: 1. Diffuse interstitial prominence, which may reflect pulmonary edema or atypical/viral infection. 2. Small bilateral pleural effusions. 3. Left lung base atelectasis. Electronically signed by: Greig Pique MD 07/11/2024 06:45 PM EST RP Workstation: [id number]     EKG: personally reviewed my interpretation is Sinus Tachycardia and Q waves in V1. Prior EKG in 2024 with no old infarct signs (Q wave)  ASSESSMENT & PLAN:   Assessment & Plan by Problem: Principal Problem:   COPD exacerbation (HCC) Active Problems:   Diabetes mellitus, type II, insulin  dependent (HCC)  Tobacco dependence   COPD (chronic obstructive pulmonary disease) (HCC)   Essential hypertension, benign   Pneumonia   Hyperglycemia   Asthma with acute exacerbation   Elevated brain natriuretic peptide (BNP) level   Generalized anxiety disorder   Vanessa [name] is a 64 y.o. person living with COPD on home oxygen (2 Liter), insulin -dependent diabetes, peripheral arterial disease, and anxiety who presented with acute respiratory distress and was admitted for acute hypercapnic respiratory failure from a COPD exacerbation with concern for a concurrent cardiopulmonary process on hospital day 0.  ## Acute hypercapnic respiratory failure due to COPD/Asthma exacerbation with mixed pulmonary picture (possible infection vs pulmonary edema) #COPD on home oxygen (2 L) # Possible CAP vs atypical infection # Elevated BNP / concern for cardiac contribution to dyspnea The patient presented with acute severe dyspnea and transient confusion, found to have marked hypercapnic respiratory acidosis (VBG PH:7.2 / pCO2:68), consistent with acute on chronic obstructive lung disease exacerbation. Her rapid improvement in gas exchange, mental status, and work of breathing after BiPAP,  bronchodilators, and systemic steroids strongly supports COPD exacerbation as the primary driver. Chest imaging demonstrates diffuse interstitial prominence with small bilateral pleural effusions and left lower lobe atelectasis, without focal consolidation. While leukocytosis is present, procalcitonin <0.1 and a negative respiratory viral panel make classic bacterial pneumonia less likely, though early or atypical infection cannot be fully excluded given severity of presentation. Additionally, BNP is elevated (854) and pleural effusions are present, raising concern for a concurrent cardiogenic or volume-related component contributing to her dyspnea. (Last Echo in 2024: EF: 50- 55%)  Overall, this represents a multifactorial respiratory decompensation, with COPD exacerbation as the dominant process, potentially worsened by infection, volume overload, medication nonadherence, vaping, and anxiety.  Plan: - BiPAP PRN per RT, wean as tolerated - HFNC /  [place] with SpO2 goal 88-92% - Continue ceftriaxone   - Start azithromycin  500 mg from tomorrow for 2 days (took Doxy today due to back order) - Reassess need for antibiotics at 24-48 hrs; de-escalate if clinically appropriate - Duonebs q4h scheduled  - Brovana  BID  - Pulmicort  neb BID - Prednisone  40 mg daily 4 days - Incentive spirometry, flutter valve - Mucinex  - Atarax  25 mg TID PRN - Repeat VBG if worsening mental status or work of breathing - Strict I/O and daily weights - Transthoracic echocardiogram (already ordered) - Telemetry monitoring - Daily weights and volume assessment - IV lasix  20 mg   # Severe hyperglycemia (stress/steroid-induced) without DKA Patient presented with glucose >500 mg/dL, without evidence of DKA (normal beta-hydroxybutyrate, near-normal anion gap and bicarbonate). Hyperglycemia is likely multifactorial, driven by stress response, steroid use, and baseline poorly controlled diabetes. Glucose has improved appropriately on  insulin  infusion. Plan: - DC IV EndoTool with goal 140-180 - Administer Semglee  18 units daily - Resistant SSI with meals - Monitor BMP/Mg daily; replete electrolytes as needed - Carb-modified diet once no longer NPO  # High CAD risk with recent anginal symptoms/rule out ACS Although she denies chest pain today, she has significant vascular disease, recent anginal symptoms, and is scheduled for cardiac catheterization. Given severity of illness and elevated BNP, ACS must be excluded. Plan: - F/U Troponin  - EKG - Continue aspirin , Plavix , Lipitor, ezetimibe  - Telemetry monitoring  # Anxiety / panic contributing to respiratory distress Anxiety appears to have significantly worsened her dyspnea and interfered with BiPAP tolerance, necessitating dexmedetomidine  in the ED. Improved anxiety correlated with improved respiratory mechanics. Plan: Hydroxyzine  25 mg TID PRN Melatonin at  bedtime Avoid benzodiazepines if possible due to hypercapnia risk Reassurance, family presence, breathing coaching  #Peripheral Arterial Disease (s/p Multiple Revascularizations) Severe PAD with extensive surgical history. Currently stable without acute ischemic symptoms. Plan: - Continue aspirin  and clopidogrel  - Continue Lipitor 80 and ezetimibe   #Hypertension Chronic HTN, currently controlled but labile during acute illness. Plan: - Hold lisinopril  and carvedilol   - Monitor BP closely during hospitalization  #Hyperlipidemia Longstanding dyslipidemia with high ASCVD risk. Plan: - Continue atorvastatin  80 mg daily - Continue ezetimibe   #Tobacco Use Disorder (in remission) / Vaping Former heavy smoker; currently vaping daily, contributing to pulmonary disease. Plan: - Strong counseling on vaping cessation - Continue bupropion  for smoking cessation support   Best practice: Diet: NPO VTE: Enoxaparin  IVF: None,None Code: Full  Disposition planning: Prior to Admission Living Arrangement:  Home, living   Anticipated Discharge Location: Home  Dispo: Admit patient to Observation with expected length of stay less than 2 midnights.  Signed: Bernadine Manos, MD Internal Medicine Resident  07/11/2024, 8:18 PM  On Call pager: [phone]   "

## 2024-07-11 NOTE — ED Triage Notes (Signed)
 Pt was BIB [facility] from home with complaints of SHOB that started an hour before. Audible wheezing, Patient refused c pap. Gave 2 nebs mg 125 mg Solumedrol, mag, and epi. 2Nd neb on arrival.  18ga RFA  20ga LH  Faint expiratory wheezing  136 pulse 191/100 BP 259 CBG   HX COPD, HTN, Diabetes, MI

## 2024-07-11 NOTE — Hospital Course (Addendum)
 #Acute hypoxic and hypercarbic respiratory failure 2/2 anxiety/panic attack #COPD with chronic hypoxia without exacerbation #HFrEF exacerbation Patient presented to the ED with shortness of breath and AMS.  She has history of COPD and was recently started on home 2 L nasal cannula per outpatient cardiology, though had not started this yet.  She was found to be hypoxic requiring high flow nasal cannula as well as hypercarbic on VBG which improved with BiPAP.  Briefly required benzodiazepines and Precedex  due to acute anxiety with BiPAP.  BNP initially elevated as well.  Updated TTE this admission showed new reduction in EF to 35-40% with LMWA and grade 2 diastolic dysfunction.  Her respiratory failure is likely multifactorial in the setting of acute anxiety/panic attack as well as HFrEF exacerbation with component of ischemic cardiomyopathy.  Cardiology followed the patient during her admission and titrated her GDMT as pressures allowed.  She received IV diuresis and transition to p.o. upon day of discharge.  Home GDMT regimen includes spironolactone  25 mg daily, metoprolol  succinate 25 mg daily, losartan  25 mg daily.  Initially was trying to start Entresto but patient's pressures did not tolerate.  Does not tolerate SGLT2i due to mycotic infections.  In terms of COPD, we switched her to Breztri  inhaler while she was in the hospital and discharged her with this.  Instructed her to finish the Breztri  until resuming home Trelegy Ellipta .  #NSTEMI type I #Multivessel CAD Patient never had chest pain during this admission, however has had history of anginal symptoms.  Troponins this admission peaked at 423 and are downtrending.  The patient had already had a previously scheduled catheterization with Dr. Jordan on 1/19 which was completed during this admission.  LHC showed multivessel coronary disease C/W diffuse diabetic disease not amenable to PCI.  Patient was thought to be poor CABG candidate given her  uncontrolled diabetes, however this could be further considered after better control.  Cardiology recommended to continue aspirin  and Plavix  for 12 months followed by Plavix  monotherapy, however, the patient already has severe PAD which requires aspirin  and Plavix  as home meds anyway.   #Severe hyperglycemia #T2DM Glucoses initially elevated to 500+ on this admission with no elevation in beta hydroxybutyrate, normal anion gap, and no ketonuria.  In the ED, she was started on Endo tool and transition to subcutaneous insulin .  No osmolar labs were obtained in the ED and thought to be severe hyperglycemia versus HHS.  Home regimen includes Semglee  36 units daily, 6 units Humalog 3 times daily with meals, Mounjaro  2.5 mg weekly (though this was not started yet).  She was given resistant SSI and long-acting insulin  was titrated up to 30 units while she was in the hospital.  We have prescribed Ozempic  to be given in the outpatient setting as her insurance did not cover Mounjaro .   #Peripheral Arterial Disease s/p multiple revascularizations #Mild Right Carotid Artery Stenosis  Follows with Dr. Serene with vascular surgery. S/p right axillary to common femoral and left femoral to popliteal bypass grafts. Carotid US  showed 1-39% stenosis in left ICA and 40-59% in right ICA. No indications for CEA at this time. No other acute concerns regarding PAD.   #Ovarian CA s/p hysterectomy and bilateral salpingo-oophorectomy #Elevated CEA Repeat CEA within normal range. Do not see reason for patient to follow up with GI at this time. 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 Viktoria.  CEA s/p procedures was elevated at 6.44 but still decreasing  from prior value of 14.40.  She was recommended to follow-up with GI for further workup, but do not see any definitive documentation of a follow-up visit.   #HTN Pressures initially elevated at 150s/70s.  Soft  pressures while titrating GDMT. Previously on home meds of Lisinopril  5 mg and Carvedilol  6.25 mg bid.  These were discontinued in place of new GDMT.  See medications above.   #HLD Last LDL of 182 and HDL 33 on 07/01/2024.  Currently taking Lipitor 80 mg daily and Zetia  10 mg daily.  Per cardiology, patient is pending referral to lipid clinic to assess need for Repatha in the outpatient setting.  #Depression Patient had normal mood and affect during admission.  No acute concerns.  We decreased citalopram  from 40 mg to 20 mg due to increased risk for QTc prolongation in patients above age 21.  Continued citalopram  at this reduced dose and Wellbutrin  while inpatient.   #Tobacco Use Disorder Patient caught vaping in the bathroom.  Refused nicotine  patches.  Continue Wellbutrin  while inpatient, however continued counseling on cessation of vaping would be beneficial in outpatient setting.

## 2024-07-12 ENCOUNTER — Other Ambulatory Visit: Payer: Self-pay

## 2024-07-12 ENCOUNTER — Observation Stay (HOSPITAL_COMMUNITY)

## 2024-07-12 ENCOUNTER — Encounter (HOSPITAL_COMMUNITY): Payer: Self-pay | Admitting: Internal Medicine

## 2024-07-12 DIAGNOSIS — I255 Ischemic cardiomyopathy: Secondary | ICD-10-CM | POA: Diagnosis present

## 2024-07-12 DIAGNOSIS — J9601 Acute respiratory failure with hypoxia: Secondary | ICD-10-CM | POA: Diagnosis not present

## 2024-07-12 DIAGNOSIS — E781 Pure hyperglyceridemia: Secondary | ICD-10-CM | POA: Diagnosis present

## 2024-07-12 DIAGNOSIS — I509 Heart failure, unspecified: Secondary | ICD-10-CM | POA: Diagnosis not present

## 2024-07-12 DIAGNOSIS — E1165 Type 2 diabetes mellitus with hyperglycemia: Secondary | ICD-10-CM | POA: Diagnosis present

## 2024-07-12 DIAGNOSIS — I10 Essential (primary) hypertension: Secondary | ICD-10-CM

## 2024-07-12 DIAGNOSIS — J9602 Acute respiratory failure with hypercapnia: Secondary | ICD-10-CM | POA: Diagnosis not present

## 2024-07-12 DIAGNOSIS — I5021 Acute systolic (congestive) heart failure: Secondary | ICD-10-CM | POA: Diagnosis not present

## 2024-07-12 DIAGNOSIS — E118 Type 2 diabetes mellitus with unspecified complications: Secondary | ICD-10-CM | POA: Diagnosis not present

## 2024-07-12 DIAGNOSIS — E785 Hyperlipidemia, unspecified: Secondary | ICD-10-CM | POA: Diagnosis not present

## 2024-07-12 DIAGNOSIS — I272 Pulmonary hypertension, unspecified: Secondary | ICD-10-CM | POA: Diagnosis present

## 2024-07-12 DIAGNOSIS — G9349 Other encephalopathy: Secondary | ICD-10-CM | POA: Diagnosis present

## 2024-07-12 DIAGNOSIS — J441 Chronic obstructive pulmonary disease with (acute) exacerbation: Secondary | ICD-10-CM | POA: Diagnosis present

## 2024-07-12 DIAGNOSIS — J9621 Acute and chronic respiratory failure with hypoxia: Secondary | ICD-10-CM | POA: Diagnosis present

## 2024-07-12 DIAGNOSIS — J9811 Atelectasis: Secondary | ICD-10-CM | POA: Diagnosis present

## 2024-07-12 DIAGNOSIS — Z9981 Dependence on supplemental oxygen: Secondary | ICD-10-CM | POA: Diagnosis not present

## 2024-07-12 DIAGNOSIS — I2510 Atherosclerotic heart disease of native coronary artery without angina pectoris: Secondary | ICD-10-CM | POA: Diagnosis not present

## 2024-07-12 DIAGNOSIS — J45901 Unspecified asthma with (acute) exacerbation: Secondary | ICD-10-CM | POA: Diagnosis present

## 2024-07-12 DIAGNOSIS — E8729 Other acidosis: Secondary | ICD-10-CM | POA: Diagnosis present

## 2024-07-12 DIAGNOSIS — R970 Elevated carcinoembryonic antigen [CEA]: Secondary | ICD-10-CM | POA: Diagnosis not present

## 2024-07-12 DIAGNOSIS — I5041 Acute combined systolic (congestive) and diastolic (congestive) heart failure: Secondary | ICD-10-CM | POA: Diagnosis not present

## 2024-07-12 DIAGNOSIS — R0609 Other forms of dyspnea: Secondary | ICD-10-CM | POA: Diagnosis not present

## 2024-07-12 DIAGNOSIS — I6523 Occlusion and stenosis of bilateral carotid arteries: Secondary | ICD-10-CM | POA: Diagnosis present

## 2024-07-12 DIAGNOSIS — I110 Hypertensive heart disease with heart failure: Secondary | ICD-10-CM | POA: Diagnosis present

## 2024-07-12 DIAGNOSIS — Z794 Long term (current) use of insulin: Secondary | ICD-10-CM

## 2024-07-12 DIAGNOSIS — R0989 Other specified symptoms and signs involving the circulatory and respiratory systems: Secondary | ICD-10-CM | POA: Diagnosis not present

## 2024-07-12 DIAGNOSIS — I214 Non-ST elevation (NSTEMI) myocardial infarction: Secondary | ICD-10-CM | POA: Diagnosis present

## 2024-07-12 DIAGNOSIS — R0602 Shortness of breath: Secondary | ICD-10-CM | POA: Diagnosis present

## 2024-07-12 DIAGNOSIS — E1151 Type 2 diabetes mellitus with diabetic peripheral angiopathy without gangrene: Secondary | ICD-10-CM | POA: Diagnosis present

## 2024-07-12 DIAGNOSIS — Z7902 Long term (current) use of antithrombotics/antiplatelets: Secondary | ICD-10-CM | POA: Diagnosis not present

## 2024-07-12 DIAGNOSIS — J440 Chronic obstructive pulmonary disease with acute lower respiratory infection: Secondary | ICD-10-CM | POA: Diagnosis present

## 2024-07-12 DIAGNOSIS — J449 Chronic obstructive pulmonary disease, unspecified: Secondary | ICD-10-CM | POA: Diagnosis not present

## 2024-07-12 DIAGNOSIS — Z1152 Encounter for screening for COVID-19: Secondary | ICD-10-CM | POA: Diagnosis not present

## 2024-07-12 DIAGNOSIS — J189 Pneumonia, unspecified organism: Secondary | ICD-10-CM | POA: Diagnosis present

## 2024-07-12 DIAGNOSIS — I25110 Atherosclerotic heart disease of native coronary artery with unstable angina pectoris: Secondary | ICD-10-CM | POA: Diagnosis not present

## 2024-07-12 DIAGNOSIS — E1136 Type 2 diabetes mellitus with diabetic cataract: Secondary | ICD-10-CM | POA: Diagnosis present

## 2024-07-12 DIAGNOSIS — F32A Depression, unspecified: Secondary | ICD-10-CM | POA: Diagnosis present

## 2024-07-12 DIAGNOSIS — I5043 Acute on chronic combined systolic (congestive) and diastolic (congestive) heart failure: Secondary | ICD-10-CM | POA: Diagnosis present

## 2024-07-12 DIAGNOSIS — J9622 Acute and chronic respiratory failure with hypercapnia: Secondary | ICD-10-CM | POA: Diagnosis present

## 2024-07-12 DIAGNOSIS — I5020 Unspecified systolic (congestive) heart failure: Secondary | ICD-10-CM | POA: Diagnosis not present

## 2024-07-12 LAB — HIV ANTIBODY (ROUTINE TESTING W REFLEX): HIV Screen 4th Generation wRfx: NONREACTIVE

## 2024-07-12 LAB — RESPIRATORY PANEL BY PCR

## 2024-07-12 LAB — ECHOCARDIOGRAM COMPLETE
AR max vel: 2.22 cm2
AV Area VTI: 2.27 cm2
AV Area mean vel: 2.31 cm2
AV Mean grad: 4 mmHg
AV Peak grad: 6 mmHg
Ao pk vel: 1.22 m/s
Area-P 1/2: 3.12 cm2
Calc EF: 45.2 %
MV VTI: 2.04 cm2
S' Lateral: 3.8 cm
Single Plane A2C EF: 46.2 %
Single Plane A4C EF: 42.1 %

## 2024-07-12 LAB — TROPONIN T, HIGH SENSITIVITY
Troponin T High Sensitivity: 296 ng/L (ref 0–19)
Troponin T High Sensitivity: 393 ng/L (ref 0–19)
Troponin T High Sensitivity: 400 ng/L (ref 0–19)
Troponin T High Sensitivity: 419 ng/L (ref 0–19)
Troponin T High Sensitivity: 423 ng/L (ref 0–19)

## 2024-07-12 LAB — COMPREHENSIVE METABOLIC PANEL WITH GFR
ALT: 46 U/L — ABNORMAL HIGH (ref 0–44)
AST: 65 U/L — ABNORMAL HIGH (ref 15–41)
Albumin: 3.6 g/dL (ref 3.5–5.0)
Alkaline Phosphatase: 124 U/L (ref 38–126)
Anion gap: 10 (ref 5–15)
BUN: 17 mg/dL (ref 8–23)
CO2: 25 mmol/L (ref 22–32)
Calcium: 8.8 mg/dL — ABNORMAL LOW (ref 8.9–10.3)
Chloride: 103 mmol/L (ref 98–111)
Creatinine, Ser: 0.77 mg/dL (ref 0.44–1.00)
GFR, Estimated: >60 mL/min
Glucose, Bld: 295 mg/dL — ABNORMAL HIGH (ref 70–99)
Potassium: 4.2 mmol/L (ref 3.5–5.1)
Sodium: 138 mmol/L (ref 135–145)
Total Bilirubin: 0.2 mg/dL (ref 0.0–1.2)
Total Protein: 6.5 g/dL (ref 6.5–8.1)

## 2024-07-12 LAB — URINALYSIS, ROUTINE W REFLEX MICROSCOPIC
Bilirubin Urine: NEGATIVE
Glucose, UA: >=500 mg/dL — AB
Ketones, ur: NEGATIVE mg/dL
Nitrite: NEGATIVE
Protein, ur: 100 mg/dL — AB
Specific Gravity, Urine: 1.011 (ref 1.005–1.030)
pH: 5 (ref 5.0–8.0)

## 2024-07-12 LAB — CBC
HCT: 37.4 % (ref 36.0–46.0)
Hemoglobin: 12 g/dL (ref 12.0–15.0)
MCH: 29.4 pg (ref 26.0–34.0)
MCHC: 32.1 g/dL (ref 30.0–36.0)
MCV: 91.7 fL (ref 80.0–100.0)
Platelets: 305 K/uL (ref 150–400)
RBC: 4.08 MIL/uL (ref 3.87–5.11)
RDW: 13.5 % (ref 11.5–15.5)
WBC: 13.9 K/uL — ABNORMAL HIGH (ref 4.0–10.5)
nRBC: 0 % (ref 0.0–0.2)

## 2024-07-12 LAB — MAGNESIUM: Magnesium: 2.2 mg/dL (ref 1.7–2.4)

## 2024-07-12 LAB — CBG MONITORING, ED
Glucose-Capillary: 254 mg/dL — ABNORMAL HIGH (ref 70–99)
Glucose-Capillary: 259 mg/dL — ABNORMAL HIGH (ref 70–99)
Glucose-Capillary: 272 mg/dL — ABNORMAL HIGH (ref 70–99)
Glucose-Capillary: 259 mg/dL — ABNORMAL HIGH (ref 70–99)

## 2024-07-12 LAB — GLUCOSE, CAPILLARY: Glucose-Capillary: 121 mg/dL — ABNORMAL HIGH (ref 70–99)

## 2024-07-12 LAB — HEPARIN LEVEL (UNFRACTIONATED): Heparin Unfractionated: 0.22 [IU]/mL — ABNORMAL LOW (ref 0.30–0.70)

## 2024-07-12 LAB — I-STAT CG4 LACTIC ACID, ED: Lactic Acid, Venous: 1.4 mmol/L (ref 0.5–1.9)

## 2024-07-12 MED ORDER — INSULIN ASPART 100 UNIT/ML IJ SOLN
0.0000 [IU] | INTRAMUSCULAR | Status: DC
  Administered 2024-07-12: 11 [IU] via SUBCUTANEOUS
  Administered 2024-07-12: 3 [IU] via SUBCUTANEOUS
  Administered 2024-07-12 (×4): 11 [IU] via SUBCUTANEOUS
  Administered 2024-07-13: 4 [IU] via SUBCUTANEOUS
  Administered 2024-07-13: 3 [IU] via SUBCUTANEOUS
  Administered 2024-07-14: 11 [IU] via SUBCUTANEOUS
  Administered 2024-07-15: 4 [IU] via SUBCUTANEOUS
  Administered 2024-07-15: 3 [IU] via SUBCUTANEOUS
  Administered 2024-07-15: 4 [IU] via SUBCUTANEOUS
  Filled 2024-07-12: qty 11
  Filled 2024-07-12: qty 3
  Filled 2024-07-12 (×2): qty 11
  Filled 2024-07-12: qty 7
  Filled 2024-07-12: qty 4
  Filled 2024-07-12: qty 11
  Filled 2024-07-12: qty 3
  Filled 2024-07-12 (×2): qty 11
  Filled 2024-07-12 (×2): qty 4

## 2024-07-12 MED ORDER — IPRATROPIUM-ALBUTEROL 0.5-2.5 (3) MG/3ML IN SOLN
3.0000 mL | RESPIRATORY_TRACT | Status: DC | PRN
  Administered 2024-07-13 (×2): 3 mL via RESPIRATORY_TRACT

## 2024-07-12 MED ORDER — ALBUTEROL SULFATE (2.5 MG/3ML) 0.083% IN NEBU
3.0000 mL | INHALATION_SOLUTION | Freq: Four times a day (QID) | RESPIRATORY_TRACT | Status: DC
  Administered 2024-07-12 – 2024-07-13 (×2): 3 mL via RESPIRATORY_TRACT
  Filled 2024-07-12 (×2): qty 3

## 2024-07-12 MED ORDER — FUROSEMIDE 10 MG/ML IJ SOLN
80.0000 mg | Freq: Two times a day (BID) | INTRAMUSCULAR | Status: DC
  Administered 2024-07-13 – 2024-07-16 (×6): 80 mg via INTRAVENOUS
  Filled 2024-07-12 (×7): qty 8

## 2024-07-12 MED ORDER — ISOSORBIDE MONONITRATE ER 30 MG PO TB24
30.0000 mg | ORAL_TABLET | Freq: Every day | ORAL | Status: DC
  Administered 2024-07-12 – 2024-07-14 (×3): 30 mg via ORAL
  Filled 2024-07-12 (×3): qty 1

## 2024-07-12 MED ORDER — NITROGLYCERIN 0.4 MG SL SUBL: 0.4000 mg | SUBLINGUAL_TABLET | SUBLINGUAL | Status: DC | PRN

## 2024-07-12 MED ORDER — FUROSEMIDE 10 MG/ML IJ SOLN
40.0000 mg | Freq: Once | INTRAMUSCULAR | Status: AC
  Administered 2024-07-12: 40 mg via INTRAVENOUS
  Filled 2024-07-12: qty 4

## 2024-07-12 MED ORDER — HEPARIN (PORCINE) 25000 UT/250ML-% IV SOLN
1500.0000 [IU]/h | INTRAVENOUS | Status: DC
  Administered 2024-07-12: 850 [IU]/h via INTRAVENOUS
  Administered 2024-07-13: 1250 [IU]/h via INTRAVENOUS
  Administered 2024-07-14: 1500 [IU]/h via INTRAVENOUS
  Filled 2024-07-12 (×3): qty 250

## 2024-07-12 MED ORDER — PERFLUTREN LIPID MICROSPHERE
1.0000 mL | INTRAVENOUS | Status: AC | PRN
  Administered 2024-07-12: 2 mL via INTRAVENOUS

## 2024-07-12 MED ORDER — FUROSEMIDE 10 MG/ML IJ SOLN: 40.0000 mg | Freq: Once | INTRAMUSCULAR | Status: DC

## 2024-07-12 MED ORDER — HEPARIN BOLUS VIA INFUSION
3900.0000 [IU] | Freq: Once | INTRAVENOUS | Status: AC
  Administered 2024-07-12: 3900 [IU] via INTRAVENOUS
  Filled 2024-07-12: qty 3900

## 2024-07-12 MED ORDER — BUDESON-GLYCOPYRROL-FORMOTEROL 160-9-4.8 MCG/ACT IN AERO
2.0000 | INHALATION_SPRAY | Freq: Two times a day (BID) | RESPIRATORY_TRACT | Status: DC
  Administered 2024-07-13 – 2024-07-17 (×7): 2 via RESPIRATORY_TRACT
  Filled 2024-07-12: qty 5.9

## 2024-07-12 MED ORDER — INSULIN GLARGINE 100 UNIT/ML ~~LOC~~ SOLN
8.0000 [IU] | Freq: Once | SUBCUTANEOUS | Status: AC
  Administered 2024-07-12: 8 [IU] via SUBCUTANEOUS
  Filled 2024-07-12: qty 0.08

## 2024-07-12 MED ORDER — INSULIN GLARGINE-YFGN 100 UNIT/ML ~~LOC~~ SOLN
26.0000 [IU] | Freq: Every day | SUBCUTANEOUS | Status: DC
  Administered 2024-07-13: 26 [IU] via SUBCUTANEOUS
  Filled 2024-07-12 (×2): qty 0.26

## 2024-07-12 MED ORDER — LISINOPRIL 5 MG PO TABS
5.0000 mg | ORAL_TABLET | Freq: Every day | ORAL | Status: DC
  Administered 2024-07-12 – 2024-07-13 (×2): 5 mg via ORAL
  Filled 2024-07-12 (×2): qty 1

## 2024-07-12 NOTE — Progress Notes (Signed)
 The patient has an uptrending troponin over 6 hours, increasing from 29 to 400. The patient denied chest pain on admission and throughout the ED stay. EKG showed no acute ischemic changes. Cardiology fellow was contacted for consultation. Plan: - Repeat troponin in 2 hours. - If troponin continues to uptrend, initiate heparin  drip. - Continue IV diuresis if the patient is volume overloaded.

## 2024-07-12 NOTE — Progress Notes (Signed)
 ANTICOAGULATION CONSULT NOTE  Pharmacy Consult for Heparin  Indication: chest pain/ACS  Allergies[1]  Patient Measurements: Height: 5' 2 (157.5 cm) Weight: 71.8 kg (158 lb 4.6 oz) IBW/kg (Calculated) : 50.1 Heparin  Dosing Weight: 65.4 kg  Vital Signs: Temp: 98.6 F (37 C) (01/17 1651) Temp Source: Oral (01/17 1651) BP: 133/72 (01/17 1658) Pulse Rate: 88 (01/17 1658)  Labs: Recent Labs    07/11/24 1755 07/11/24 1819 07/11/24 1934 07/11/24 2203 07/12/24 0206 07/12/24 0212 07/12/24 1817  HGB 13.9 15.0  15.0 13.6  --   --  12.0  --   HCT 44.1 44.0  44.0 40.0  --   --  37.4  --   PLT 492*  --   --   --   --  305  --   HEPARINUNFRC  --   --   --   --   --   --  0.22*  CREATININE 0.80 0.70  --  0.80 0.77  --   --     Estimated Creatinine Clearance: 66.8 mL/min (by C-G formula based on SCr of 0.77 mg/dL).   Medical History: Past Medical History:  Diagnosis Date   Asthma    No PFT performed   Blood transfusion without reported diagnosis    has to donate blood due to having thick blood.   Complication of anesthesia    Woken up afterwards with panic attacks   COPD (chronic obstructive pulmonary disease) (HCC)    Depression    Diabetes mellitus 2002   Dyspnea    with exertion   GERD (gastroesophageal reflux disease)    Helicobacter pylori (H. pylori) infection    s/p triple therapy   Hepatic hemangioma    History of heart murmur in childhood    History of kidney stones    Hyperlipidemia    Hypertension    Hypertriglyceridemia    Panic attacks    mostly Agaraphobia   Peripheral arterial disease    Peripheral vascular disease    Pneumonia    Ventral hernia     Medications:  Medications Prior to Admission  Medication Sig Dispense Refill Last Dose/Taking   acetaminophen  (TYLENOL ) 650 MG CR tablet Take 1,300 mg by mouth every 8 (eight) hours as needed for pain.   Past Week   albuterol  (VENTOLIN  HFA) 108 (90 Base) MCG/ACT inhaler Inhale 2 puffs into the  lungs every 6 (six) hours as needed. 6.7 g 6 Past Week   aspirin  EC 81 MG tablet Take 1 tablet (81 mg total) by mouth daily at 6 (six) AM. Swallow whole. 30 tablet 12 07/10/2024 Morning   atorvastatin  (LIPITOR) 80 MG tablet Take 1 tablet (80 mg total) by mouth daily. (Patient taking differently: Take 40 mg by mouth daily.) 90 tablet 2 07/10/2024 Morning   buPROPion  (WELLBUTRIN  SR) 150 MG 12 hr tablet Take 1 tablet (150 mg total) by mouth 2 (two) times daily. 60 tablet 3 07/10/2024 Evening   carvedilol  (COREG ) 6.25 MG tablet Take 1 tablet (6.25 mg total) by mouth 2 (two) times daily. 180 tablet 3 07/11/2024 Noon   cetirizine  (ZYRTEC ) 10 MG tablet Take 10 mg by mouth daily as needed for allergies.   Unknown   cimetidine  (TAGAMET  HB 200) 200 MG tablet Take 200 mg by mouth daily as needed (for heartburn).   Unknown   citalopram  (CELEXA ) 40 MG tablet Take 1 tablet (40 mg total) by mouth daily. 90 tablet 3 07/10/2024 Morning   clopidogrel  (PLAVIX ) 75 MG tablet Take 1 tablet (75  mg total) by mouth daily. 90 tablet 3 07/11/2024 Morning   Continuous Glucose Receiver (DEXCOM G7 RECEIVER) DEVI Use with Dexcom G7 sensors to monitor your glucose continuously 1 each 1 Taking   Continuous Glucose Sensor (DEXCOM G7 SENSOR) MISC Place new sensor every 10 days. Use to monitor blood sugar continuously. 9 each 3 Taking   ezetimibe  (ZETIA ) 10 MG tablet Take 1 tablet (10 mg total) by mouth daily. 30 tablet 3 07/10/2024 Morning   Fluticasone -Umeclidin-Vilant (TRELEGY ELLIPTA ) 200-62.5-25 MCG/ACT AEPB Inhale 1 puff into the lungs daily. 60 each 9 07/10/2024 Morning   ibuprofen  (ADVIL ) 200 MG tablet Take 400 mg by mouth every 8 (eight) hours as needed for mild pain (pain score 1-3) or moderate pain (pain score 4-6).   Unknown   Insulin  Glargine (BASAGLAR  KWIKPEN) 100 UNIT/ML Inject 36 Units into the skin daily. 15 mL 6 07/10/2024 Evening   insulin  lispro (HUMALOG) 100 UNIT/ML injection Inject 6 Units into the skin 3 (three) times  daily before meals.   07/10/2024 Evening   Insulin  Pen Needle (PEN NEEDLES) 32G X 4 MM MISC Use 3-4x a day while on insulin  therapy 100 each 11    lisinopril  (ZESTRIL ) 5 MG tablet Take 1 tablet (5 mg total) by mouth daily. 90 tablet 3 07/10/2024 Morning   gabapentin  (NEURONTIN ) 300 MG capsule Take 1 capsule (300 mg total) by mouth at bedtime. (Patient not taking: Reported on 07/09/2024) 30 capsule 2    glucose blood test strip Use as instructed 100 each 12    Insulin  Pen Needle (PEN NEEDLES) 31G X 5 MM MISC 1 each by Does not apply route daily. Use to inject insulin  100 each 0    nitroGLYCERIN  (NITROSTAT ) 0.4 MG SL tablet Place 1 tablet (0.4 mg total) under the tongue every 5 (five) minutes as needed for chest pain. (Patient not taking: Reported on 07/11/2024) 75 tablet 3 Not Taking   tirzepatide  (MOUNJARO ) 2.5 MG/0.5ML Pen Inject 2.5 mg into the skin once a week. (Patient not taking: Reported on 07/11/2024) 2 mL 1 Not Taking   Scheduled:   albuterol   3 mL Inhalation QID   aspirin  EC  81 mg Oral Q0600   atorvastatin   80 mg Oral Daily   budesonide -glycopyrrolate -formoterol   2 puff Inhalation BID   buPROPion   150 mg Oral BID   citalopram   40 mg Oral Daily   clopidogrel   75 mg Oral Daily   ezetimibe   10 mg Oral Daily   furosemide   80 mg Intravenous BID   guaiFENesin   600 mg Oral BID   insulin  aspart  0-20 Units Subcutaneous Q4H   [START ON 07/13/2024] insulin  glargine-yfgn  26 Units Subcutaneous Daily   isosorbide  mononitrate  30 mg Oral Daily   lisinopril   5 mg Oral Daily   melatonin  3 mg Oral QHS   Infusions:   heparin  850 Units/hr (07/12/24 1828)   PRN: acetaminophen  **OR** acetaminophen , dextrose , hydrOXYzine , ipratropium-albuterol , nitroGLYCERIN , polyethylene glycol  Assessment: 63 yof with a history of COPD, asthma, T2Dm, HTN, HLD, PAD. Patient is presenting with acute respiratory distress. Rising troponin noted in ED. Heparin  per pharmacy consult placed for chest pain/ACS.  Patient  is not on anticoagulation prior to arrival.  Hgb 12; plt 305  PM update: heparin  level 0.22 is subtherapeutic on 850 units/hr. No issues with the infusion or bleeding reported per RN.   Goal of Therapy:  Heparin  level 0.3-0.7 units/ml Monitor platelets by anticoagulation protocol: Yes   Plan:  Increase heparin  infusion to  1050 units/hr Check anti-Xa level in 8 hours and daily while on heparin  Continue to monitor H&H and platelets  Rocky Slade, PharmD, BCPS 07/12/2024 6:49 PM  Please check AMION for all [facility] Pharmacy phone numbers After 10:00 PM, call Main Pharmacy [phone]         [1]  Allergies Allergen Reactions   Jardiance  [Empagliflozin ] Other (See Comments)    Severe yeast infection   Latex Rash   Avandia [Rosiglitazone] Other (See Comments)    Headaches

## 2024-07-12 NOTE — Consult Note (Signed)
 "  Cardiology Consultation  Patient ID: Vanessa [name] MRN: [id number]; DOB: 1961-04-29  Admit date: 07/11/2024 Date of Consult: 07/12/2024  PCP:  Jolaine Pac, DO   [facility] Providers Cardiologist:  Peter Jordan, MD     Patient Profile: Vanessa [name] is a 64 y.o. female with a hx of poorly controlled diabetes, hyperlipidemia, hypertension, peripheral arterial disease s/p axillofemoral bypass on right and left iliac stenting with left femoropopliteal bypass, COPD on chronic oxygen, 2 L, who is being seen 07/12/2024 for the evaluation of elevated troponin at the request of Dr. Trudy.  History of Present Illness: [name] has past medical history stated above.  She presented to the [facility] emergency department on 07/11/2024 in setting of respiratory distress.  Prior to arrival, her daughter noted that she was more confused than normal.  They note that the patient has COPD and has not been wearing her oxygen consistently.  She had no chest pain upon arrival to the ER.  Relevant workup in the ER includes: Troponin level 28 ? 91 ? 168 ? 296 ? 400 ? 419 ? 423, respiratory panel negative, glucose 535 upon arrival, lactic acid 2.9 ? 1.4, proBNP mildly elevated at 854, CBC shows leukocytosis with WBC 19.6 ? 13.9 with elevated neutrophils, thrombocytosis which appears chronic.  VBG showed hypercapnic respiratory acidosis. CXR shows pulmonary edema vs atypical/viral infection, small bilateral pleural effusions, left lung base atelectasis.  EKG shows sinus rhythm, HR 77, new Q waves noted.   She was recently seen in our outpatient office by Dr. Jordan on 07/01/2024 for an evaluation of chest pain.  During this visit, she reported having severe burning/central chest pain since her husband passed in September.  She noted that the pain did radiate to her left shoulder/arm, left neck/ear.  She reported taking one of her husbands sublingual nitroglycerin  which provided her some relief.  Plan at this  visit: Carvedilol  6.25 mg twice daily, as needed sublingual nitroglycerin , plan for cardiac cath on 07/14/2024, continue Lipitor and Zetia  with referral to lipid clinic for possible Repatha.  She has been admitted to the teaching service, cardiology was asked to consult in the setting of elevated troponins.  In ER, she required BiPAP for respiratory distress along with continuous bronchodilators and systemic steroids.  She was started on insulin  drip for severe hyperglycemia as well as ceftriaxone /doxycycline  for possible pneumonia.  There is suspicion for multifactorial respiratory decompensation, with COPD as a dominant process but potentially worsened by infection, volume overload, medication nonadherence.   She is presently on: Aspirin  81 mg, Plavix  75 mg, Lipitor 80 mg, Zetia  10 mg. She was given IV Lasix  20 mg x 1 dose, 40 mg x 1 dose. Patient reports poor urine output.   After speaking with the patient, and her daughter present in the room, they agree with the history as stated above. She continues to decline any active or prior chest pain. She tells me that she currently feels fine, she is just aggravated. Her daughter admits that since her father, the patient's husband, passing in September of this year, there have been issues with getting her medications due to insurance complications. She just recently has been able to get back on all of her home medications. With this, they note some shortness of breath that has been ongoing for a few weeks.   Past Medical History:  Diagnosis Date   Asthma    No PFT performed   Blood transfusion without reported diagnosis  has to donate blood due to having thick blood.   Complication of anesthesia    Woken up afterwards with panic attacks   COPD (chronic obstructive pulmonary disease) (HCC)    Depression    Diabetes mellitus 2002   Dyspnea    with exertion   GERD (gastroesophageal reflux disease)    Helicobacter pylori (H. pylori) infection     s/p triple therapy   Hepatic hemangioma    History of heart murmur in childhood    History of kidney stones    Hyperlipidemia    Hypertension    Hypertriglyceridemia    Panic attacks    mostly Agaraphobia   Peripheral arterial disease    Peripheral vascular disease    Pneumonia    Ventral hernia    Past Surgical History:  Procedure Laterality Date   ABDOMINAL AORTOGRAM W/LOWER EXTREMITY N/A 11/24/2022   Procedure: ABDOMINAL AORTOGRAM W/LOWER EXTREMITY;  Surgeon: Magda Debby SAILOR, MD;  Location: [facility];  Service: Cardiovascular;  Laterality: N/A;   ABDOMINAL AORTOGRAM W/LOWER EXTREMITY N/A 08/07/2023   Procedure: ABDOMINAL AORTOGRAM W/LOWER EXTREMITY;  Surgeon: Serene Gaile ORN, MD;  Location: [facility];  Service: Cardiovascular;  Laterality: N/A;   AMPUTATION Right 12/02/2022   Procedure: RAY AMPUTATION OF  RIGHT 5TH & 4TH TOES;  Surgeon: Malvin Marsa FALCON, DPM;  Location: [facility] OR;  Service: Podiatry;  Laterality: Right;   APPLICATION OF WOUND VAC Right 11/29/2022   Procedure: APPLICATION OF WOUND VAC AND KERECIS MICRO WOUND GRAFT;  Surgeon: Serene Gaile ORN, MD;  Location: [facility];  Service: Vascular;  Laterality: Right;   AXILLARY-FEMORAL BYPASS GRAFT Right 11/29/2022   Procedure: RIGHT AXILLA ARTERY - RIGHT FEMORAL ARTERY BYPASS GRAFT USING 8mm X 70cm PROPATEN GORE GRAFT WITH REMOVABLE RINGS;  Surgeon: Serene Gaile ORN, MD;  Location: [facility];  Service: Vascular;  Laterality: Right;   CESAREAN SECTION     with 3 children   COLONOSCOPY  2018   ENDARTERECTOMY FEMORAL Left 08/22/2023   Procedure: LEFT ENDARTERECTOMY FEMORAL;  Surgeon: Serene Gaile ORN, MD;  Location: [facility];  Service: Vascular;  Laterality: Left;   FEMORAL-POPLITEAL BYPASS GRAFT Left 08/22/2023   Procedure: LEFT BYPASS GRAFT FEMORAL-POPLITEAL ARTERY;  Surgeon: Serene Gaile ORN, MD;  Location: [facility];  Service: Vascular;  Laterality: Left;   INSERTION OF ILIAC STENT Left 08/22/2023   Procedure: INSERTION OF  ILIAC STENT - LEFT;  Surgeon: Serene Gaile ORN, MD;  Location: [facility];  Service: Vascular;  Laterality: Left;   OVARIAN CYST REMOVAL     Age 32   PERIPHERAL VASCULAR INTERVENTION Left 08/07/2023   Procedure: PERIPHERAL VASCULAR INTERVENTION;  Surgeon: Serene Gaile ORN, MD;  Location: [facility];  Service: Cardiovascular;  Laterality: Left;  Iliac   ROBOTIC ASSISTED BILATERAL SALPINGO OOPHERECTOMY Bilateral 11/23/2021   Procedure: XI ROBOTIC ASSISTED BILATERAL SALPINGO OOPHORECTOMY,  MINI LAPAROTOMY;  Surgeon: Viktoria Comer SAUNDERS, MD;  Location: [facility];  Service: Gynecology;  Laterality: Bilateral;   ROBOTIC ASSISTED TOTAL HYSTERECTOMY N/A 11/23/2021   Procedure: XI ROBOTIC ASSISTED TOTAL HYSTERECTOMY, STAGING, CYSTOSCOPY;  Surgeon: Viktoria Comer SAUNDERS, MD;  Location: [facility];  Service: Gynecology;  Laterality: N/A;   TUBAL LIGATION     UPPER GASTROINTESTINAL ENDOSCOPY  2018    Home Medications:  Prior to Admission medications  Medication Sig Start Date End Date Taking? Authorizing Provider  acetaminophen  (TYLENOL ) 650 MG CR tablet Take 1,300 mg by mouth every 8 (eight) hours as needed for pain.  Yes [provider]  albuterol  (VENTOLIN  HFA) 108 (90 Base) MCG/ACT inhaler Inhale 2 puffs into the lungs every 6 (six) hours as needed. 07/04/24  Yes Nguyen, Diana, MD  aspirin  EC 81 MG tablet Take 1 tablet (81 mg total) by mouth daily at 6 (six) AM. Swallow whole. 12/06/22  Yes Jolaine Pac, DO  atorvastatin  (LIPITOR) 80 MG tablet Take 1 tablet (80 mg total) by mouth daily. Patient taking differently: Take 40 mg by mouth daily. 12/13/23 07/11/25 Yes Jolaine Pac, DO  buPROPion  (WELLBUTRIN  SR) 150 MG 12 hr tablet Take 1 tablet (150 mg total) by mouth 2 (two) times daily. 06/02/24  Yes Nguyen, Diana, MD  carvedilol  (COREG ) 6.25 MG tablet Take 1 tablet (6.25 mg total) by mouth 2 (two) times daily. 07/01/24  Yes Jordan, Peter M, MD  cetirizine  (ZYRTEC ) 10 MG tablet Take 10 mg by mouth daily as  needed for allergies.   Yes [provider]  cimetidine  (TAGAMET  HB 200) 200 MG tablet Take 200 mg by mouth daily as needed (for heartburn).   Yes [provider]  citalopram  (CELEXA ) 40 MG tablet Take 1 tablet (40 mg total) by mouth daily. 12/13/23  Yes Jolaine Pac, DO  clopidogrel  (PLAVIX ) 75 MG tablet Take 1 tablet (75 mg total) by mouth daily. 06/02/24  Yes Nguyen, Diana, MD  Continuous Glucose Receiver (DEXCOM G7 RECEIVER) DEVI Use with Dexcom G7 sensors to monitor your glucose continuously 07/03/24  Yes Jolaine Pac, DO  Continuous Glucose Sensor (DEXCOM G7 SENSOR) MISC Place new sensor every 10 days. Use to monitor blood sugar continuously. 07/04/24  Yes Jolaine Pac, DO  ezetimibe  (ZETIA ) 10 MG tablet Take 1 tablet (10 mg total) by mouth daily. 12/13/23  Yes Jolaine Pac, DO  Fluticasone -Umeclidin-Vilant (TRELEGY ELLIPTA ) 200-62.5-25 MCG/ACT AEPB Inhale 1 puff into the lungs daily. 07/03/24  Yes Jolaine Pac, DO  ibuprofen  (ADVIL ) 200 MG tablet Take 400 mg by mouth every 8 (eight) hours as needed for mild pain (pain score 1-3) or moderate pain (pain score 4-6).   Yes [provider]  Insulin  Glargine (BASAGLAR  KWIKPEN) 100 UNIT/ML Inject 36 Units into the skin daily. 03/18/24  Yes Jolaine Pac, DO  insulin  lispro (HUMALOG) 100 UNIT/ML injection Inject 6 Units into the skin 3 (three) times daily before meals.   Yes [provider]  Insulin  Pen Needle (PEN NEEDLES) 32G X 4 MM MISC Use 3-4x a day while on insulin  therapy 07/04/24   Jolaine Pac, DO  lisinopril  (ZESTRIL ) 5 MG tablet Take 1 tablet (5 mg total) by mouth daily. 03/18/24  Yes Jolaine Pac, DO  gabapentin  (NEURONTIN ) 300 MG capsule Take 1 capsule (300 mg total) by mouth at bedtime. Patient not taking: Reported on 07/09/2024 12/13/23 07/01/24  Jolaine Pac, DO  glucose blood test strip Use as instructed 10/12/16   Rosan Raisin Ratliff, DO  Insulin  Pen Needle (PEN NEEDLES) 31G X 5 MM  MISC 1 each by Does not apply route daily. Use to inject insulin  08/09/22   Jolaine Pac, DO  nitroGLYCERIN  (NITROSTAT ) 0.4 MG SL tablet Place 1 tablet (0.4 mg total) under the tongue every 5 (five) minutes as needed for chest pain. Patient not taking: Reported on 07/11/2024 07/01/24 09/29/24  Jordan, Peter M, MD  tirzepatide  (MOUNJARO ) 2.5 MG/0.5ML Pen Inject 2.5 mg into the skin once a week. Patient not taking: Reported on 07/11/2024 07/03/24   Jolaine Pac, DO    Scheduled Meds:  arformoterol   15 mcg Nebulization BID   aspirin  EC  81 mg Oral Q0600   atorvastatin   80 mg Oral Daily   azithromycin   500 mg Oral Daily   budesonide  (PULMICORT ) nebulizer solution  0.25 mg Nebulization BID   buPROPion   150 mg Oral BID   citalopram   40 mg Oral Daily   clopidogrel   75 mg Oral Daily   enoxaparin  (LOVENOX ) injection  40 mg Subcutaneous Q24H   ezetimibe   10 mg Oral Daily   guaiFENesin   600 mg Oral BID   insulin  aspart  0-20 Units Subcutaneous Q4H   insulin  glargine-yfgn  18 Units Subcutaneous Daily   ipratropium-albuterol   3 mL Nebulization Q4H   melatonin  3 mg Oral QHS   predniSONE   40 mg Oral Q breakfast   Continuous Infusions:  PRN Meds: acetaminophen  **OR** acetaminophen , albuterol , dextrose , hydrOXYzine , perflutren  lipid microspheres (DEFINITY ) IV suspension, polyethylene glycol  Allergies:   Allergies[1]  Social History:   Social History   Socioeconomic History   Marital status: Married    Spouse name: Not on file   Number of children: Not on file   Years of education: Not on file   Highest education level: Not on file  Occupational History   Not on file  Tobacco Use   Smoking status: Former    Current packs/day: 1.00    Average packs/day: 1 pack/day for 43.0 years (43.0 ttl pk-yrs)    Types: Cigarettes   Smokeless tobacco: Never   Tobacco comments:    Wants Wellbutrin      Vapes currently  Vaping Use   Vaping status: Every Day   Substances: Nicotine   Substance and  Sexual Activity   Alcohol use: No    Alcohol/week: 0.0 standard drinks of alcohol    Comment: none   Drug use: No   Sexual activity: Not Currently  Other Topics Concern   Not on file  Social History Narrative   Drinks at least a pot of coffee every day.    Social Drivers of Health   Tobacco Use: Medium Risk (07/03/2024)   Patient History    Smoking Tobacco Use: Former    Smokeless Tobacco Use: Never    Passive Exposure: Not on file  Financial Resource Strain: Not on file  Food Insecurity: No Food Insecurity (08/22/2023)   Hunger Vital Sign    Worried About Running Out of Food in the Last Year: Never true    Ran Out of Food in the Last Year: Never true  Transportation Needs: No Transportation Needs (08/22/2023)   PRAPARE - Administrator, Civil Service (Medical): No    Lack of Transportation (Non-Medical): No  Physical Activity: Not on file  Stress: Stress Concern Present (06/02/2024)   [employer] of Occupational Health - Occupational Stress Questionnaire    Feeling of Stress: Very much  Social Connections: Socially Isolated (06/02/2024)   Social Connection and Isolation Panel    Frequency of Communication with Friends and Family: More than three times a week    Frequency of Social Gatherings with Friends and Family: Twice a week    Attends Religious Services: Never    Database Administrator or Organizations: No    Attends Banker Meetings: Never    Marital Status: Widowed  Intimate Partner Violence: Not At Risk (08/22/2023)   Humiliation, Afraid, Rape, and Kick questionnaire    Fear of Current or Ex-Partner: No    Emotionally Abused: No    Physically Abused: No    Sexually Abused: No  Depression (PHQ2-9): Medium Risk (  07/03/2024)   Depression (PHQ2-9)    PHQ-2 Score: 6  Alcohol Screen: Low Risk (06/02/2024)   Alcohol Screen    Last Alcohol Screening Score (AUDIT): 0  Housing: Low Risk (08/22/2023)   Housing Stability Vital Sign    Unable to Pay  for Housing in the Last Year: No    Number of Times Moved in the Last Year: 0    Homeless in the Last Year: No  Utilities: Not At Risk (08/22/2023)   AHC Utilities    Threatened with loss of utilities: No  Health Literacy: Adequate Health Literacy (06/02/2024)   B1300 Health Literacy    Frequency of need for help with medical instructions: Never    Family History:   Family History  Problem Relation Age of Onset   Colon polyps Mother        benign   Ovarian cancer Mother 3   Heart attack Brother    Breast cancer Maternal Aunt        dx late 39s   Diabetes Maternal Uncle    Colon cancer Neg Hx    Stomach cancer Neg Hx    Endometrial cancer Neg Hx    Prostate cancer Neg Hx    Pancreatic cancer Neg Hx    Esophageal cancer Neg Hx    Rectal cancer Neg Hx     ROS:  Please see the history of present illness.  All other ROS reviewed and negative.     Physical Exam/Data: Vitals:   07/12/24 0430 07/12/24 0745 07/12/24 0749 07/12/24 1032  BP: (!) 157/70 (!) 115/96    Pulse: 83 81    Resp: (!) 28 (!) 21    Temp:   97.6 F (36.4 C)   TempSrc:   Oral   SpO2: 97% 95%    Weight:    71.8 kg  Height:    5' 2 (1.575 m)    Intake/Output Summary (Last 24 hours) at 07/12/2024 1034 Last data filed at 07/12/2024 0512 Gross per 24 hour  Intake 49.03 ml  Output 800 ml  Net -750.97 ml      07/12/2024   10:32 AM 07/03/2024    2:09 PM 07/01/2024    9:35 AM  Last 3 Weights  Weight (lbs) 158 lb 4.6 oz 158 lb 3.2 oz 153 lb 12.8 oz  Weight (kg) 71.8 kg 71.759 kg 69.763 kg     Body mass index is 28.95 kg/m.   General:  chronically ill-appearing, in no acute distress, on HHFNC 40 L HEENT: normal Neck: unable to assess JVD due to body habitus  Vascular: Distal pulses 2+ bilaterally, + carotid bruit on R Cardiac:  normal S1, S2; RRR; no murmur  Lungs:  decreased breath sounds bilaterally, mild wheezing  Abd: soft, nontender, no hepatomegaly  Ext: no edema Musculoskeletal:  No deformities,  BUE and BLE strength normal and equal Skin: warm and dry  Neuro:  CNs 2-12 intact, no focal abnormalities noted Psych:  Normal affect   EKG:  The EKG was personally reviewed and demonstrates:  sinus, HR 77, new Q waves  Telemetry:  Telemetry was personally reviewed and demonstrates:  sinus, HR 60-70s  Relevant CV Studies:  Echocardiogram, 07/12/2024 Ordered, pending results  Echocardiogram, 11/26/2022 Left ventricular ejection fraction, by estimation, is 50 to 55% . The left ventricle has low normal function. The left ventricle has no regional wall motion abnormalities. Left ventricular diastolic parameters were normal.  Right ventricular systolic function is normal. The right ventricular size is  normal. Tricuspid regurgitation signal is inadequate for assessing PA pressure.  Right atrial size was mildly dilated.  The mitral valve is normal in structure. No evidence of mitral valve regurgitation. No evidence of mitral stenosis.  The aortic valve was not well visualized. Aortic valve regurgitation is not visualized. No aortic stenosis is present.  The inferior vena cava is normal in size with greater than 50% respiratory variability, suggesting right atrial pressure of 3 mmHg.  Laboratory Data: High Sensitivity Troponin:  No results for input(s): TROPONINIHS in the last 720 hours.  Recent Labs  Lab 07/11/24 2203 07/11/24 2351 07/12/24 0206 07/12/24 0356 07/12/24 0610  TRNPT 168* 296* 400* 419* 423*      Chemistry Recent Labs  Lab 07/11/24 1755 07/11/24 1819 07/11/24 1934 07/11/24 2203 07/12/24 0206  NA 136 138  138 138 141 138  K 5.1 4.9  4.9 5.0 4.3 4.2  CL 100 103  --  105 103  CO2 23  --   --  25 25  GLUCOSE 535* 543*  --  249* 295*  BUN 10 12  --  14 17  CREATININE 0.80 0.70  --  0.80 0.77  CALCIUM  9.1  --   --  8.9 8.8*  MG  --   --   --   --  2.2  GFRNONAA >60  --   --  >60 >60  ANIONGAP 14  --   --  12 10    Recent Labs  Lab 07/12/24 0206  PROT 6.5   ALBUMIN 3.6  AST 65*  ALT 46*  ALKPHOS 124  BILITOT 0.2   Lipids No results for input(s): CHOL, TRIG, HDL, LABVLDL, LDLCALC, CHOLHDL in the last 168 hours.  Hematology Recent Labs  Lab 07/11/24 1755 07/11/24 1819 07/11/24 1934 07/12/24 0212  WBC 19.6*  --   --  13.9*  RBC 4.72  --   --  4.08  HGB 13.9 15.0  15.0 13.6 12.0  HCT 44.1 44.0  44.0 40.0 37.4  MCV 93.4  --   --  91.7  MCH 29.4  --   --  29.4  MCHC 31.5  --   --  32.1  RDW 13.7  --   --  13.5  PLT 492*  --   --  305   Thyroid  No results for input(s): TSH, FREET4 in the last 168 hours.  BNP Recent Labs  Lab 07/11/24 1755  PROBNP 854.0*    DDimer No results for input(s): DDIMER in the last 168 hours.  Radiology/Studies:  DG Chest Port 1 View Result Date: 07/11/2024 EXAM: 1 VIEW(S) XRAY OF THE CHEST 07/11/2024 06:10:53 PM COMPARISON: None available. CLINICAL HISTORY: sob FINDINGS: LUNGS AND PLEURA: Small bilateral pleural effusions. Diffuse interstitial prominence, consistent with possible pulmonary edema or atypical/viral infection. Left lung base atelectasis. No focal pulmonary opacity. No pneumothorax. HEART AND MEDIASTINUM: Heart size at upper limits of normal. Aortic atherosclerosis. BONES AND SOFT TISSUES: No acute osseous abnormality. IMPRESSION: 1. Diffuse interstitial prominence, which may reflect pulmonary edema or atypical/viral infection. 2. Small bilateral pleural effusions. 3. Left lung base atelectasis. Electronically signed by: Greig Pique MD 07/11/2024 06:45 PM EST RP Workstation: [id number]   Assessment and Plan:  Elevated troponin level  28 ? 91 ? 168 ? 296 ? 400 ? 419 ? 423 Presented with acute hypoxic respiratory failure Suspected to be multifactorial, with COPD exacerbation driving Patient denies any prior or current chest pain EKG shows sinus with new Q waves  Scheduled  to undergo outpatient LHC 07/14/2024 Currently on aspirin  81 mg daily Currently on Plavix  75 mg  daily Pending updated echocardiogram Start IV heparin  Suspect that without active chest pain, okay to remain on for [facility] on Monday as scheduled, will discuss with MD  Acute on chronic respiratory failure Shortness of breath Suspect multifactorial, COPD is dominant process with possible infection, volume overload, medication nonadherence BNP mildly elevated at 854 CXR showed pleural effusions or possible infection Given IV Lasix  20 mg x 1 dose, 40 mg x 1 dose, reports poor urine output  Does not appear overly volume overloaded on exam Primary to continue treatment for COPD exacerbation, possible CAP Pending updated echocardiogram, further recs pending results   Hyperlipidemia 07/01/2024: HDL 33; LDL Chol Calc (NIH) 182 07/12/2024: ALT 46  Pending referral to lipid clinic as an outpatient to assess need for Repatha Currently on Lipitor 80 mg daily Currently on Zetia  10 mg daily  Carotid bruit, R Noted on exam Patient is a vasculopath  Will check carotid doppler this admission   Per primary Acute on chronic COPD Possible pneumonia Severe hyperglycemia Uncontrolled type 2 diabetes Mood disorders Hypertension  PAD  Risk Assessment/Risk Scores:      For questions or updates, please contact [facility] Please consult [email or web address] for contact info under   Signed, Waddell DELENA Donath, PA-C  07/12/2024 10:34 AM     [1]  Allergies Allergen Reactions   Jardiance  [Empagliflozin ] Other (See Comments)    Severe yeast infection   Latex Rash   Avandia [Rosiglitazone] Other (See Comments)    Headaches    "

## 2024-07-12 NOTE — Assessment & Plan Note (Signed)
 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 Viktoria. CEA decreased but still elevated after surgery. She was advised to follow up with GI but lost to follow up.

## 2024-07-12 NOTE — Progress Notes (Signed)
 ANTICOAGULATION CONSULT NOTE  Pharmacy Consult for Heparin  Indication: chest pain/ACS  Allergies[1]  Patient Measurements:   Heparin  Dosing Weight: 65.4 kg  Vital Signs: Temp: 97.6 F (36.4 C) (01/17 0749) Temp Source: Oral (01/17 0749) BP: 115/96 (01/17 0745) Pulse Rate: 81 (01/17 0745)  Labs: Recent Labs    07/11/24 1755 07/11/24 1819 07/11/24 1934 07/11/24 2203 07/12/24 0206 07/12/24 0212  HGB 13.9 15.0  15.0 13.6  --   --  12.0  HCT 44.1 44.0  44.0 40.0  --   --  37.4  PLT 492*  --   --   --   --  305  CREATININE 0.80 0.70  --  0.80 0.77  --     Estimated Creatinine Clearance: 66.8 mL/min (by C-G formula based on SCr of 0.77 mg/dL).   Medical History: Past Medical History:  Diagnosis Date   Asthma    No PFT performed   Blood transfusion without reported diagnosis    has to donate blood due to having thick blood.   Complication of anesthesia    Woken up afterwards with panic attacks   COPD (chronic obstructive pulmonary disease) (HCC)    Depression    Diabetes mellitus 2002   Dyspnea    with exertion   GERD (gastroesophageal reflux disease)    Helicobacter pylori (H. pylori) infection    s/p triple therapy   Hepatic hemangioma    History of heart murmur in childhood    History of kidney stones    Hyperlipidemia    Hypertension    Hypertriglyceridemia    Panic attacks    mostly Agaraphobia   Peripheral arterial disease    Peripheral vascular disease    Pneumonia    Ventral hernia     Medications:  (Not in a hospital admission)  Scheduled:   arformoterol   15 mcg Nebulization BID   aspirin  EC  81 mg Oral Q0600   atorvastatin   80 mg Oral Daily   azithromycin   500 mg Oral Daily   budesonide  (PULMICORT ) nebulizer solution  0.25 mg Nebulization BID   buPROPion   150 mg Oral BID   citalopram   40 mg Oral Daily   clopidogrel   75 mg Oral Daily   enoxaparin  (LOVENOX ) injection  40 mg Subcutaneous Q24H   ezetimibe   10 mg Oral Daily    guaiFENesin   600 mg Oral BID   insulin  aspart  0-20 Units Subcutaneous Q4H   insulin  glargine-yfgn  18 Units Subcutaneous Daily   ipratropium-albuterol   3 mL Nebulization Q4H   melatonin  3 mg Oral QHS   predniSONE   40 mg Oral Q breakfast   Infusions:  PRN: acetaminophen  **OR** acetaminophen , albuterol , dextrose , hydrOXYzine , perflutren  lipid microspheres (DEFINITY ) IV suspension, polyethylene glycol  Assessment: 63 yof with a history of COPD, asthma, T2Dm, HTN, HLD, PAD. Patient is presenting with acute respiratory distress. Rising troponin noted in ED. Heparin  per pharmacy consult placed for chest pain/ACS.  Patient is not on anticoagulation prior to arrival.  Hgb 12; plt 305  Goal of Therapy:  Heparin  level 0.3-0.7 units/ml Monitor platelets by anticoagulation protocol: Yes   Plan:  Give IV heparin  3900 units bolus x 1 Start heparin  infusion at 850 units/hr Check anti-Xa level in 8 hours and daily while on heparin  Continue to monitor H&H and platelets  Dorn Buttner, PharmD, BCPS 07/12/2024 10:32 AM ED Clinical Pharmacist -  [phone]       [1]  Allergies Allergen Reactions   Jardiance  [Empagliflozin ] Other (See Comments)  Severe yeast infection   Latex Rash   Avandia [Rosiglitazone] Other (See Comments)    Headaches

## 2024-07-12 NOTE — ED Notes (Signed)
 This RN spoke with Dr. Elicia and informed him that the IV insulin  drip was stopped immediately after the subQ insulin  SEMGLEE  was administered. See Orders and MAR for orders.

## 2024-07-12 NOTE — Progress Notes (Addendum)
 "  HD#0 SUBJECTIVE:  Patient Summary: Vanessa [name] is a 64 y.o. female with a pertinent PMH of chronic hypoxic respiratory failure on 2LNC, suspected COPD (no documented PFTs), HFmrEF, angina, PAD s/p multiple revascularizations, T2DM, HTN, HLD, TUD, depression, anxiety, and panic attacks who presented with SOB and admitted for AHRF d/t COPD exacerbation.   Overnight Events: Troponin steadily uptrending to 400s without active CP, Cardiology called and will eval in the morning.   Interim History: Patient feels 100% better compared to when she presented to the ED yesterday. She is on high flow nasal cannula and states that she is not short of breath currently. Reports that she only just received home oxygen 2LNC 1 day before coming to the hospital so hasn't been on it long-term. No pain anywhere, denies chest pain, N/V/D, abdominal pain. Reports that her She thinks her legs are swollen with the left one worse than the right but this is normal for her. She is frustrated with being in the hospital and would like to be home so that she can care for her dogs and cats. Daughter at bedside said she is able to help care for the pets.  OBJECTIVE:  Vital Signs: Vitals:   07/12/24 0745 07/12/24 0749 07/12/24 1000 07/12/24 1032  BP: (!) 115/96  125/83   Pulse: 81  74   Resp: (!) 21  19   Temp:  97.6 F (36.4 C)    TempSrc:  Oral    SpO2: 95%  96%   Weight:    71.8 kg  Height:    5' 2 (1.575 m)    Filed Weights   07/12/24 1032  Weight: 71.8 kg     Intake/Output Summary (Last 24 hours) at 07/12/2024 1114 Last data filed at 07/12/2024 0512 Gross per 24 hour  Intake 49.03 ml  Output 800 ml  Net -750.97 ml   Net IO Since Admission: -750.97 mL [07/12/24 1114]  Physical Exam: Constitutional: Chronically ill-appearing female in no acute distress HENT: normocephalic atraumatic, mucous membranes moist Eyes: conjunctiva non-erythematous, PERRL, no scleral icterus Cardiovascular: regular rate and  rhythm, no m/r/g; JVD not appreciated but exam limited due to body habitus Pulmonary/Chest: mildly increased work of breathing on HFNC, lungs clear to auscultation bilaterally - no wheezing, rhonchi, rales heard Abdominal: soft, non-tender, non-distended, bowel sounds normal Neurological: alert & oriented x3, moving all extremities equally Skin: warm and dry; linear vertical scar noted to medial aspect of left thigh Extremities: 2+ pitting edema LLE without erythema or cyanosis, 1+ non-pitting RLE; peripheral pulses intact; patient declined foot exam Psych: normal mood and affect, thought content normal  Patient Lines/Drains/Airways Status     Active Line/Drains/Airways     Name Placement date Placement time Site Days   Peripheral IV 07/11/24 18 G Anterior;Left Forearm 07/11/24  1803  Forearm  1   Peripheral IV 07/11/24 20 G Anterior;Proximal;Right Forearm 07/11/24  1903  Forearm  1            Pertinent labs and imaging:     Latest Ref Rng & Units 07/12/2024    2:12 AM 07/11/2024    7:34 PM 07/11/2024    6:19 PM  CBC  WBC 4.0 - 10.5 K/uL 13.9     Hemoglobin 12.0 - 15.0 g/dL 87.9  86.3  84.9    84.9   Hematocrit 36.0 - 46.0 % 37.4  40.0  44.0    44.0   Platelets 150 - 400 K/uL 305  Latest Ref Rng & Units 07/12/2024    2:06 AM 07/11/2024   10:03 PM 07/11/2024    7:34 PM  CMP  Glucose 70 - 99 mg/dL 704  750    BUN 8 - 23 mg/dL 17  14    Creatinine 9.55 - 1.00 mg/dL 9.22  9.19    Sodium 864 - 145 mmol/L 138  141  138   Potassium 3.5 - 5.1 mmol/L 4.2  4.3  5.0   Chloride 98 - 111 mmol/L 103  105    CO2 22 - 32 mmol/L 25  25    Calcium  8.9 - 10.3 mg/dL 8.8  8.9    Total Protein 6.5 - 8.1 g/dL 6.5     Total Bilirubin 0.0 - 1.2 mg/dL 0.2     Alkaline Phos 38 - 126 U/L 124     AST 15 - 41 U/L 65     ALT 0 - 44 U/L 46       DG Chest Port 1 View Result Date: 07/11/2024 EXAM: 1 VIEW(S) XRAY OF THE CHEST 07/11/2024 06:10:53 PM COMPARISON: None available. CLINICAL  HISTORY: sob FINDINGS: LUNGS AND PLEURA: Small bilateral pleural effusions. Diffuse interstitial prominence, consistent with possible pulmonary edema or atypical/viral infection. Left lung base atelectasis. No focal pulmonary opacity. No pneumothorax. HEART AND MEDIASTINUM: Heart size at upper limits of normal. Aortic atherosclerosis. BONES AND SOFT TISSUES: No acute osseous abnormality. IMPRESSION: 1. Diffuse interstitial prominence, which may reflect pulmonary edema or atypical/viral infection. 2. Small bilateral pleural effusions. 3. Left lung base atelectasis. Electronically signed by: Greig Pique MD 07/11/2024 06:45 PM EST RP Workstation: [id number]    ASSESSMENT/PLAN:  Assessment: Principal Problem:   COPD exacerbation (HCC) Active Problems:   Diabetes mellitus, type II, insulin  dependent (HCC)   Tobacco dependence   COPD (chronic obstructive pulmonary disease) (HCC)   Essential hypertension, benign   Pneumonia   Hyperglycemia   Asthma with acute exacerbation   Elevated brain natriuretic peptide (BNP) level   Generalized anxiety disorder   Vanessa [name] is a 64 y.o. female with a history of chronic hypoxic respiratory failure on 2LNC, suspected COPD (no documented PFTs), HFmrEF, angina, PAD s/p multiple revascularizations, T2DM, HTN, HLD, TUD, depression, anxiety, and panic attacks who presented with SOB and admitted for AHRF d/t COPD exacerbation, now on hospital day 0.  Plan: #Acute on Chronic Hypoxic Respiratory Failure #Suspected COPD Exacerbation #Suspected CHF Exacerbation Patient presented with acute shortness of breath and confusion in the setting of chronic hypoxic respiratory failure (recently started [facility] at home) likely in the setting of suspected COPD (no documented PFTs) given significant smoking history.  Wheezing on initial exam with diminished breath sounds.  Reports improvement in symptoms on evaluation this morning, with mild increased work of breathing on HFNC and no  wheezing or inspiratory crackles heard.  Initial VBG showed hypercapnia which could be chronic process, however she did improve in terms of mentation after BiPAP.  proBNP was on higher end of normal for this patient's age.  Reported history of HFpEF with last EF 50-55% without diastolic dysfunction in 2024.  AHRF likely multifactorial given improvement in symptoms after breathing treatments, steroid therapy, anti-inflammatory antibiotics, and BiPAP.  Congestive CHF could be contributing given volume overload in terms of BLE edema and POCUS results showing noncollapsible IVC from night team. Received 20mg  and 40mg  IV Lasix  reportedly without good UOP. Infectious etiology may also be contributing considering CXR showing interstitial infiltrates, small bilateral pleural effusions, left lung base  atelectasis.  Pro-Calcitonin was low but she did have leukocytosis to 20 with elevation in lactic acid (which is now resolved) on presentation to ED.  RVP including COVID/flu/RSV was negative.  Plan:  - BiPAP as needed per RT, wean as tolerated - HFNC/Tonsina with SpO2 goal of 88-92% - TTE pending - Continue azithromycin  500 mg daily for 2 more days (received Doxy 1/16) - Continue ipratropium-albuterol  (DuoNeb) 0.5-2.5 nebulizer every 4 hours - Continue arformoterol  (Brovana ) 15 mcg nebulizer twice daily - Continue budesonide  (Pulmicort ) 0.25 mg nebulizer twice daily - Continue albuterol  nebulizer every 2 hours as needed for wheezing/SOB - Continue Mucinex  600 mg twice daily - Incentive spirometry and Flutter Valve - Hold home Trelegy  #Angina #Tropinemia Recently saw Dr. Jordan in outpatient setting with plans for [facility] on 07/14/24. No active chest pain on admission or today. Exam unrevealing. EKG without concerning ST/T wave abnormalities. Troponin uptrending, has not reached peak but delta flattening. 28->91->168->296->400->419->423. Takes ASA and Plavix  given extensive PAD as well as Lipitor and Zetia . Will  continue these home medications. Cardiology consulted and following patient, appreciate further recommendations. - Cardiology consulted, appreciate recommendations  - Start Heparin , pharmacy to dose  - [facility] planned for 1/19 - Continue home ASA 81 mg daily - Continue home Clopidogrel  75 mg daily - Continue home Lipitor 80 mg daily - Continue home Zetia  10 mg daily  #Severe hyperglycemia #T2DM Glucose elevated to 500+ on admission, now downtrending to 250s. No elevation in beta-hydroxybutyrate, AG normal, no ketonuria. She was started on EndoTool in ED which has since been discontinued. Last A1c of 12.4% on 07/01/24. Currently taking Semglee  36 units daily, 6 units Humalog 3 times daily before meals, Mounjaro  2.5 mg weekly but has not started yet. Intolerant to SGLT2i due to mycotic infections and metformin  due to GI distress. Hyperglycemia on admission likely severe hyperglycemia vs HHS. Rapid improvement with EndoTool and now on SQ insulin . Will continue resistant SSI and increase Semglee  as patient is no longer NPO. - Resistant SSI - Increase Semglee  to 26 units daily - Carb-modified diet - Trend CBGs  #Peripheral Arterial Disease s/p multiple revascularizations #Right Carotid Bruit Follows with Dr. Serene with vascular surgery. S/p right axillary to common femoral and left femoral to popliteal bypass grafts. No acute concerns during this admission. Patient not having any leg or foot pain. Extremities are warm and well-perfused. She declined further foot exam to assess previous ulcers. Her severe PAD places her at higher risk for atherosclerotic disease elsewhere and she is currently undergoing further workup for CAD and carotid atherosclerosis given bruit heard by Cardiology at outpatient appt. Will continue to monitor for active symptoms. - Continue home ASA + Plavix  as above - Carotid US  pending  #Anxiety #Panic Attacks #Depression On evaluation this morning patient had normal affect and  mood. Not currently experiencing anxiety or feelings of panic.  Presented with bad anxiety and required Precedex  in the ED.  Currently takes citalopram  and Wellbutrin .  Will continue home medications and add hydroxyzine  25 as as needed. - Continue citalopram  40 mg daily - Continue Wellbutrin  SR 150 mg twice daily - Continue hydroxyzine  25 mg 3 times daily as needed for anxiety  #HTN BP ranging from 115-130s/80s-90s this morning. Currently taking Lisinopril  5 mg and Carvedilol  6.25 mg bid. Will continue holding this medications while BP is stable. - Continue to monitor  #HLD Last LDL of 182 and HDL 33 on 07/01/2024.  Currently taking Lipitor 80 mg daily and Zetia  10 mg daily.  Per cardiology, patient is pending referral to lipid clinic to assess need for Repatha in the outpatient setting. - Continue home Lipitor and Zetia  as above - Follow-up outpatient in lipid clinic pending referral  #Tobacco Use Disorder Currently in remission.  Patient with 43-pack-year history, quit 1 year ago.  Now currently vapes nicotine  daily.  Currently on Wellbutrin  which will also help with cravings.  Counseled patient on cessation of vaping. - Continue Wellbutrin  SR 150 mg twice daily   Best Practice: Diet: Carb-modified IVF: Fluids: None, Rate: None VTE: Heparin  Code: Full  Disposition planning: Therapy Recs: Pending, DME: none Family Contact: Eleanor Graff (Daughter), at bedside. DISPO: Anticipated discharge to Home pending clinical improvement and further workup.  Signature:  Letha Cheadle, MD [facility]  PGY-1 07/12/2024, 11:14 AM  On Call pager [phone]  "

## 2024-07-12 NOTE — ED Notes (Signed)
 Pt refuses purewick, states it breaks her out in a rash. Daughter at bedside tried to convince her to try it as well. Pt refuses. Pt states she will attempt bedside commode when needed.

## 2024-07-13 ENCOUNTER — Inpatient Hospital Stay (HOSPITAL_COMMUNITY)

## 2024-07-13 DIAGNOSIS — I214 Non-ST elevation (NSTEMI) myocardial infarction: Secondary | ICD-10-CM | POA: Diagnosis not present

## 2024-07-13 DIAGNOSIS — J9601 Acute respiratory failure with hypoxia: Secondary | ICD-10-CM

## 2024-07-13 DIAGNOSIS — I5020 Unspecified systolic (congestive) heart failure: Secondary | ICD-10-CM | POA: Diagnosis not present

## 2024-07-13 DIAGNOSIS — R0989 Other specified symptoms and signs involving the circulatory and respiratory systems: Secondary | ICD-10-CM | POA: Diagnosis not present

## 2024-07-13 DIAGNOSIS — F32A Depression, unspecified: Secondary | ICD-10-CM | POA: Diagnosis not present

## 2024-07-13 DIAGNOSIS — J9602 Acute respiratory failure with hypercapnia: Secondary | ICD-10-CM | POA: Diagnosis not present

## 2024-07-13 DIAGNOSIS — R970 Elevated carcinoembryonic antigen [CEA]: Secondary | ICD-10-CM

## 2024-07-13 DIAGNOSIS — E1165 Type 2 diabetes mellitus with hyperglycemia: Secondary | ICD-10-CM | POA: Diagnosis not present

## 2024-07-13 DIAGNOSIS — I110 Hypertensive heart disease with heart failure: Secondary | ICD-10-CM

## 2024-07-13 DIAGNOSIS — E1151 Type 2 diabetes mellitus with diabetic peripheral angiopathy without gangrene: Secondary | ICD-10-CM | POA: Diagnosis not present

## 2024-07-13 DIAGNOSIS — I2510 Atherosclerotic heart disease of native coronary artery without angina pectoris: Secondary | ICD-10-CM

## 2024-07-13 DIAGNOSIS — J449 Chronic obstructive pulmonary disease, unspecified: Secondary | ICD-10-CM | POA: Diagnosis not present

## 2024-07-13 DIAGNOSIS — E785 Hyperlipidemia, unspecified: Secondary | ICD-10-CM | POA: Diagnosis not present

## 2024-07-13 LAB — CBC
HCT: 36.6 % (ref 36.0–46.0)
Hemoglobin: 11.5 g/dL — ABNORMAL LOW (ref 12.0–15.0)
MCH: 29.3 pg (ref 26.0–34.0)
MCHC: 31.4 g/dL (ref 30.0–36.0)
MCV: 93.4 fL (ref 80.0–100.0)
Platelets: 340 K/uL (ref 150–400)
RBC: 3.92 MIL/uL (ref 3.87–5.11)
RDW: 13.9 % (ref 11.5–15.5)
WBC: 19.2 K/uL — ABNORMAL HIGH (ref 4.0–10.5)
nRBC: 0 % (ref 0.0–0.2)

## 2024-07-13 LAB — GLUCOSE, CAPILLARY
Glucose-Capillary: 113 mg/dL — ABNORMAL HIGH (ref 70–99)
Glucose-Capillary: 119 mg/dL — ABNORMAL HIGH (ref 70–99)
Glucose-Capillary: 134 mg/dL — ABNORMAL HIGH (ref 70–99)
Glucose-Capillary: 157 mg/dL — ABNORMAL HIGH (ref 70–99)
Glucose-Capillary: 85 mg/dL (ref 70–99)
Glucose-Capillary: 92 mg/dL (ref 70–99)

## 2024-07-13 LAB — BASIC METABOLIC PANEL WITH GFR
Anion gap: 10 (ref 5–15)
BUN: 21 mg/dL (ref 8–23)
CO2: 26 mmol/L (ref 22–32)
Calcium: 8.9 mg/dL (ref 8.9–10.3)
Chloride: 104 mmol/L (ref 98–111)
Creatinine, Ser: 0.68 mg/dL (ref 0.44–1.00)
GFR, Estimated: >60 mL/min
Glucose, Bld: 101 mg/dL — ABNORMAL HIGH (ref 70–99)
Potassium: 4.2 mmol/L (ref 3.5–5.1)
Sodium: 140 mmol/L (ref 135–145)

## 2024-07-13 LAB — HEPARIN LEVEL (UNFRACTIONATED)
Heparin Unfractionated: 0.19 [IU]/mL — ABNORMAL LOW (ref 0.30–0.70)
Heparin Unfractionated: 0.16 [IU]/mL — ABNORMAL LOW (ref 0.30–0.70)
Heparin Unfractionated: 0.38 [IU]/mL (ref 0.30–0.70)

## 2024-07-13 MED ORDER — FREE WATER: 500.0000 mL | Freq: Once | Status: DC

## 2024-07-13 MED ORDER — ALBUTEROL SULFATE (2.5 MG/3ML) 0.083% IN NEBU
3.0000 mL | INHALATION_SOLUTION | Freq: Three times a day (TID) | RESPIRATORY_TRACT | Status: DC
  Administered 2024-07-13 (×2): 3 mL via RESPIRATORY_TRACT
  Filled 2024-07-13 (×3): qty 3

## 2024-07-13 MED ORDER — ASPIRIN 81 MG PO CHEW
81.0000 mg | CHEWABLE_TABLET | ORAL | Status: AC
  Administered 2024-07-14: 81 mg via ORAL
  Filled 2024-07-13: qty 1

## 2024-07-13 MED ORDER — CITALOPRAM HYDROBROMIDE 20 MG PO TABS
20.0000 mg | ORAL_TABLET | Freq: Every day | ORAL | Status: DC
  Administered 2024-07-14 – 2024-07-17 (×4): 20 mg via ORAL
  Filled 2024-07-13 (×4): qty 1

## 2024-07-13 NOTE — Progress Notes (Signed)
 OT Cancellation Note  Patient Details Name: LONISHA BOBBY MRN: [id number] DOB: 05/10/1961   Cancelled Treatment:    Reason Eval/Treat Not Completed: Patient declined, no reason specified OT attempted x 2 for evaluation. Came back at pt's requested time on second attempt with pt reporting just received meds and was sleepy. Will follow up as schedule permits, likely tomorrow.   Mliss Fish 07/13/2024, 10:39 AM

## 2024-07-13 NOTE — Progress Notes (Signed)
 ANTICOAGULATION CONSULT NOTE  Pharmacy Consult for Heparin  Indication: chest pain/ACS  Allergies[1]  Patient Measurements: Height: 5' 2 (157.5 cm) Weight: 74.4 kg (164 lb) IBW/kg (Calculated) : 50.1 Heparin  Dosing Weight: 65.4 kg  Vital Signs: Temp: 98.8 F (37.1 C) (01/18 0754) Temp Source: Oral (01/18 0754) BP: 108/67 (01/18 0754) Pulse Rate: 66 (01/18 0754)  Labs: Recent Labs    07/11/24 1755 07/11/24 1819 07/11/24 1934 07/11/24 2203 07/12/24 0206 07/12/24 0212 07/12/24 1817 07/13/24 0325 07/13/24 0634 07/13/24 0925  HGB 13.9   < > 13.6  --   --  12.0  --   --  11.5*  --   HCT 44.1   < > 40.0  --   --  37.4  --   --  36.6  --   PLT 492*  --   --   --   --  305  --   --  340  --   HEPARINUNFRC  --   --   --   --   --   --  0.22* 0.19*  --  0.16*  CREATININE 0.80   < >  --  0.80 0.77  --   --   --  0.68  --    < > = values in this interval not displayed.    Estimated Creatinine Clearance: 67.9 mL/min (by C-G formula based on SCr of 0.68 mg/dL).   Medical History: Past Medical History:  Diagnosis Date   Asthma    No PFT performed   Blood transfusion without reported diagnosis    has to donate blood due to having thick blood.   Complication of anesthesia    Woken up afterwards with panic attacks   COPD (chronic obstructive pulmonary disease) (HCC)    Depression    Diabetes mellitus 2002   Dyspnea    with exertion   GERD (gastroesophageal reflux disease)    Helicobacter pylori (H. pylori) infection    s/p triple therapy   Hepatic hemangioma    History of heart murmur in childhood    History of kidney stones    Hyperlipidemia    Hypertension    Hypertriglyceridemia    Panic attacks    mostly Agaraphobia   Peripheral arterial disease    Peripheral vascular disease    Pneumonia    Ventral hernia     Medications:  Medications Prior to Admission  Medication Sig Dispense Refill Last Dose/Taking   acetaminophen  (TYLENOL ) 650 MG CR tablet Take  1,300 mg by mouth every 8 (eight) hours as needed for pain.   Past Week   albuterol  (VENTOLIN  HFA) 108 (90 Base) MCG/ACT inhaler Inhale 2 puffs into the lungs every 6 (six) hours as needed. 6.7 g 6 Past Week   aspirin  EC 81 MG tablet Take 1 tablet (81 mg total) by mouth daily at 6 (six) AM. Swallow whole. 30 tablet 12 07/10/2024 Morning   atorvastatin  (LIPITOR) 80 MG tablet Take 1 tablet (80 mg total) by mouth daily. (Patient taking differently: Take 40 mg by mouth daily.) 90 tablet 2 07/10/2024 Morning   buPROPion  (WELLBUTRIN  SR) 150 MG 12 hr tablet Take 1 tablet (150 mg total) by mouth 2 (two) times daily. 60 tablet 3 07/10/2024 Evening   carvedilol  (COREG ) 6.25 MG tablet Take 1 tablet (6.25 mg total) by mouth 2 (two) times daily. 180 tablet 3 07/11/2024 Noon   cetirizine  (ZYRTEC ) 10 MG tablet Take 10 mg by mouth daily as needed for allergies.   Unknown  cimetidine  (TAGAMET  HB 200) 200 MG tablet Take 200 mg by mouth daily as needed (for heartburn).   Unknown   citalopram  (CELEXA ) 40 MG tablet Take 1 tablet (40 mg total) by mouth daily. 90 tablet 3 07/10/2024 Morning   clopidogrel  (PLAVIX ) 75 MG tablet Take 1 tablet (75 mg total) by mouth daily. 90 tablet 3 07/11/2024 Morning   Continuous Glucose Receiver (DEXCOM G7 RECEIVER) DEVI Use with Dexcom G7 sensors to monitor your glucose continuously 1 each 1 Taking   Continuous Glucose Sensor (DEXCOM G7 SENSOR) MISC Place new sensor every 10 days. Use to monitor blood sugar continuously. 9 each 3 Taking   ezetimibe  (ZETIA ) 10 MG tablet Take 1 tablet (10 mg total) by mouth daily. 30 tablet 3 07/10/2024 Morning   Fluticasone -Umeclidin-Vilant (TRELEGY ELLIPTA ) 200-62.5-25 MCG/ACT AEPB Inhale 1 puff into the lungs daily. 60 each 9 07/10/2024 Morning   ibuprofen  (ADVIL ) 200 MG tablet Take 400 mg by mouth every 8 (eight) hours as needed for mild pain (pain score 1-3) or moderate pain (pain score 4-6).   Unknown   Insulin  Glargine (BASAGLAR  KWIKPEN) 100 UNIT/ML Inject  36 Units into the skin daily. 15 mL 6 07/10/2024 Evening   insulin  lispro (HUMALOG) 100 UNIT/ML injection Inject 6 Units into the skin 3 (three) times daily before meals.   07/10/2024 Evening   Insulin  Pen Needle (PEN NEEDLES) 32G X 4 MM MISC Use 3-4x a day while on insulin  therapy 100 each 11    lisinopril  (ZESTRIL ) 5 MG tablet Take 1 tablet (5 mg total) by mouth daily. 90 tablet 3 07/10/2024 Morning   gabapentin  (NEURONTIN ) 300 MG capsule Take 1 capsule (300 mg total) by mouth at bedtime. (Patient not taking: Reported on 07/09/2024) 30 capsule 2    glucose blood test strip Use as instructed 100 each 12    Insulin  Pen Needle (PEN NEEDLES) 31G X 5 MM MISC 1 each by Does not apply route daily. Use to inject insulin  100 each 0    nitroGLYCERIN  (NITROSTAT ) 0.4 MG SL tablet Place 1 tablet (0.4 mg total) under the tongue every 5 (five) minutes as needed for chest pain. (Patient not taking: Reported on 07/11/2024) 75 tablet 3 Not Taking   tirzepatide  (MOUNJARO ) 2.5 MG/0.5ML Pen Inject 2.5 mg into the skin once a week. (Patient not taking: Reported on 07/11/2024) 2 mL 1 Not Taking   Scheduled:   albuterol   3 mL Inhalation TID   aspirin  EC  81 mg Oral Q0600   atorvastatin   80 mg Oral Daily   budesonide -glycopyrrolate -formoterol   2 puff Inhalation BID   buPROPion   150 mg Oral BID   citalopram   40 mg Oral Daily   clopidogrel   75 mg Oral Daily   ezetimibe   10 mg Oral Daily   furosemide   80 mg Intravenous BID   guaiFENesin   600 mg Oral BID   insulin  aspart  0-20 Units Subcutaneous Q4H   insulin  glargine-yfgn  26 Units Subcutaneous Daily   isosorbide  mononitrate  30 mg Oral Daily   lisinopril   5 mg Oral Daily   melatonin  3 mg Oral QHS   Infusions:   heparin  1,250 Units/hr (07/13/24 0540)   PRN: acetaminophen  **OR** acetaminophen , dextrose , hydrOXYzine , ipratropium-albuterol , nitroGLYCERIN , polyethylene glycol  Assessment: 63 yof with a history of COPD, asthma, T2Dm, HTN, HLD, PAD. Patient is  presenting with acute respiratory distress. Rising troponin noted in ED. Heparin  per pharmacy consult placed for chest pain/ACS.  Patient is not on anticoagulation prior to arrival.  1/18: Heparin  level is subtherapeutic at 0.16 on 1250 units/hr. CBC is stable (hgb 11.5, plt 340). No issues with the infusion or bleeding reported per RN. RN reported that patient stated that she has a history of polycythemia, which she was told makes it hard for me to be anticoagulated sometimes.  Goal of Therapy:  Heparin  level 0.3-0.7 units/ml Monitor platelets by anticoagulation protocol: Yes   Plan:  Increase heparin  infusion to 1500 units/hr Recheck heparin  level in 6 hours Monitor heparin  level, CBC, and signs of bleeding daily  B. Amon Rocher, PharmD PGY-1 Pharmacy Resident [facility] 07/13/2024 10:01 AM   Please check AMION for all [facility] phone numbers After 10:00 PM, call Main Pharmacy [phone]          [1]  Allergies Allergen Reactions   Jardiance  [Empagliflozin ] Other (See Comments)    Severe yeast infection   Latex Rash   Avandia [Rosiglitazone] Other (See Comments)    Headaches

## 2024-07-13 NOTE — Progress Notes (Signed)
 "   Progress Note  Patient Name: Vanessa [name] Date of Encounter: 07/13/2024  Primary Cardiologist: Peter Jordan, MD  Subjective   SOB improved.  Urine output was not charted but she reported that she urinated a lot with IV Lasix .  Inpatient Medications    Scheduled Meds:  albuterol   3 mL Inhalation TID   aspirin  EC  81 mg Oral Q0600   atorvastatin   80 mg Oral Daily   budesonide -glycopyrrolate -formoterol   2 puff Inhalation BID   buPROPion   150 mg Oral BID   citalopram   40 mg Oral Daily   clopidogrel   75 mg Oral Daily   ezetimibe   10 mg Oral Daily   furosemide   80 mg Intravenous BID   guaiFENesin   600 mg Oral BID   insulin  aspart  0-20 Units Subcutaneous Q4H   insulin  glargine-yfgn  26 Units Subcutaneous Daily   isosorbide  mononitrate  30 mg Oral Daily   lisinopril   5 mg Oral Daily   melatonin  3 mg Oral QHS   Continuous Infusions:  heparin  1,250 Units/hr (07/13/24 0540)   PRN Meds: acetaminophen  **OR** acetaminophen , dextrose , hydrOXYzine , ipratropium-albuterol , nitroGLYCERIN , polyethylene glycol   Vital Signs    Vitals:   07/12/24 1900 07/12/24 1934 07/13/24 0417 07/13/24 0754  BP: 121/63  (!) 103/56 108/67  Pulse:  82 68 66  Resp: 15 15 18 17   Temp: 98.5 F (36.9 C)  98.2 F (36.8 C) 98.8 F (37.1 C)  TempSrc: Oral  Oral Oral  SpO2:   94% 94%  Weight:   74.4 kg   Height:        Intake/Output Summary (Last 24 hours) at 07/13/2024 1020 Last data filed at 07/13/2024 0418 Gross per 24 hour  Intake 498.99 ml  Output --  Net 498.99 ml   Filed Weights   07/12/24 1032 07/13/24 0417  Weight: 71.8 kg 74.4 kg    Telemetry     Personally reviewed.  NSR.  NSVT 1 run.  ECG   Not performed today.  Physical Exam   GEN: No acute distress.   Neck: Unable to examine due to body but is Cardiac: RRR, no murmur, rub, or gallop.  Respiratory: Nonlabored. Clear to auscultation bilaterally. GI: Soft, nontender, bowel sounds present. MS: No edema; No  deformity. Neuro:  Nonfocal. Psych: Alert and oriented x 3. Normal affect.  Labs    Chemistry Recent Labs  Lab 07/11/24 2203 07/12/24 0206 07/13/24 0634  NA 141 138 140  K 4.3 4.2 4.2  CL 105 103 104  CO2 25 25 26   GLUCOSE 249* 295* 101*  BUN 14 17 21   CREATININE 0.80 0.77 0.68  CALCIUM  8.9 8.8* 8.9  PROT  --  6.5  --   ALBUMIN  --  3.6  --   AST  --  65*  --   ALT  --  46*  --   ALKPHOS  --  124  --   BILITOT  --  0.2  --   GFRNONAA >60 >60 >60  ANIONGAP 12 10 10      Hematology Recent Labs  Lab 07/11/24 1755 07/11/24 1819 07/11/24 1934 07/12/24 0212 07/13/24 0634  WBC 19.6*  --   --  13.9* 19.2*  RBC 4.72  --   --  4.08 3.92  HGB 13.9   < > 13.6 12.0 11.5*  HCT 44.1   < > 40.0 37.4 36.6  MCV 93.4  --   --  91.7 93.4  MCH 29.4  --   --  29.4 29.3  MCHC 31.5  --   --  32.1 31.4  RDW 13.7  --   --  13.5 13.9  PLT 492*  --   --  305 340   < > = values in this interval not displayed.    Cardiac EnzymesNo results for input(s): TROPONINIHS in the last 720 hours.  BNP Recent Labs  Lab 07/11/24 1755  PROBNP 854.0*     DDimerNo results for input(s): DDIMER in the last 168 hours.    Assessment & Plan    NSTEMI Acute systolic and diastolic heart failure - Presented with sudden onset of SOB. - Denies having any angina. . But she had typical angina (burning chest pain with exertion and eating) that started in 02/2024 and kept gradually worsening. Occurs 3 times/week. She was originally scheduled for outpatient LHC on Monday, 07/14/2024.  - Chest x-ray showed pulmonary edema/congestion. - proBNP elevated, 854.  EKG showed NSR, no Q waves in the anterior leads. - Hs troponins elevated, peaked at 423. - Echo yesterday showed LVEF 35 to 40% with severe hypokinesis in the lateral wall. - Continue IV Lasix  80 mg BID.  Urine output was not charted while she was in the ER but she reported that she urinated a lot and that her SOB improved compared to yesterday. -  Continue Imdur  30 mg once daily and lisinopril  5 mg once daily. - Cardiac risk factors include HTN, DM2, PAD.  Vasculopathic. - Continue ACS protocol, heparin  drip.  On DAPT, high intensity statin and Zetia . - Avoid steroids.  Discontinue prednisone .  Do not think this is COPD exacerbation. - Schedule for LHC and RHC on Monday/07/14/2024.  Keep NPO after midnight.   Informed consent for LHC and RHC Risks and benefits of cardiac catheterization have been discussed with the patient.  These include bleeding, infection, kidney damage, stroke, heart attack, death.  The patient understands these risks and is willing to proceed.   HTN, controlled - Resume home antihypertensive medications.   DM 2, poorly controlled - Per primary team.   40 minutes spent in reviewing prior medical records, reports, more than 3 labs, discussion and documentation.  Signed, Diannah SHAUNNA Maywood, MD  07/13/2024, 10:20 AM    "

## 2024-07-13 NOTE — Plan of Care (Signed)
  Problem: Education: Goal: Ability to describe self-care measures that may prevent or decrease complications (Diabetes Survival Skills Education) will improve Outcome: Progressing Goal: Individualized Educational Video(s) Outcome: Progressing   Problem: Coping: Goal: Ability to adjust to condition or change in health will improve Outcome: Progressing   Problem: Fluid Volume: Goal: Ability to maintain a balanced intake and output will improve Outcome: Progressing   Problem: Health Behavior/Discharge Planning: Goal: Ability to identify and utilize available resources and services will improve Outcome: Progressing Goal: Ability to manage health-related needs will improve Outcome: Progressing   Problem: Metabolic: Goal: Ability to maintain appropriate glucose levels will improve Outcome: Progressing   Problem: Nutritional: Goal: Maintenance of adequate nutrition will improve Outcome: Progressing Goal: Progress toward achieving an optimal weight will improve Outcome: Progressing   Problem: Skin Integrity: Goal: Risk for impaired skin integrity will decrease Outcome: Progressing   Problem: Tissue Perfusion: Goal: Adequacy of tissue perfusion will improve Outcome: Progressing   Problem: Education: Goal: Knowledge of General Education information will improve Description: Including pain rating scale, medication(s)/side effects and non-pharmacologic comfort measures Outcome: Progressing   Problem: Health Behavior/Discharge Planning: Goal: Ability to manage health-related needs will improve Outcome: Progressing   Problem: Activity: Goal: Risk for activity intolerance will decrease Outcome: Progressing   Problem: Nutrition: Goal: Adequate nutrition will be maintained Outcome: Progressing   Problem: Coping: Goal: Level of anxiety will decrease Outcome: Progressing   Problem: Elimination: Goal: Will not experience complications related to bowel motility Outcome:  Progressing Goal: Will not experience complications related to urinary retention Outcome: Progressing   Problem: Pain Managment: Goal: General experience of comfort will improve and/or be controlled Outcome: Progressing   Problem: Safety: Goal: Ability to remain free from injury will improve Outcome: Progressing   Problem: Skin Integrity: Goal: Risk for impaired skin integrity will decrease Outcome: Progressing

## 2024-07-13 NOTE — Progress Notes (Signed)
 ANTICOAGULATION CONSULT NOTE  Pharmacy Consult for Heparin  Indication: chest pain/ACS  Allergies[1]  Patient Measurements: Height: 5' 2 (157.5 cm) Weight: 74.4 kg (164 lb) IBW/kg (Calculated) : 50.1 Heparin  Dosing Weight: 65.4 kg  Vital Signs: Temp: 98.7 F (37.1 C) (01/18 1637) Temp Source: Oral (01/18 1637) BP: 129/60 (01/18 1637) Pulse Rate: 82 (01/18 1637)  Labs: Recent Labs    07/11/24 1755 07/11/24 1819 07/11/24 1934 07/11/24 2203 07/12/24 0206 07/12/24 0212 07/12/24 1817 07/13/24 0325 07/13/24 0634 07/13/24 0925 07/13/24 1658  HGB 13.9   < > 13.6  --   --  12.0  --   --  11.5*  --   --   HCT 44.1   < > 40.0  --   --  37.4  --   --  36.6  --   --   PLT 492*  --   --   --   --  305  --   --  340  --   --   HEPARINUNFRC  --   --   --   --   --   --    < > 0.19*  --  0.16* 0.38  CREATININE 0.80   < >  --  0.80 0.77  --   --   --  0.68  --   --    < > = values in this interval not displayed.    Estimated Creatinine Clearance: 67.9 mL/min (by C-G formula based on SCr of 0.68 mg/dL).   Medical History: Past Medical History:  Diagnosis Date   Asthma    No PFT performed   Blood transfusion without reported diagnosis    has to donate blood due to having thick blood.   Complication of anesthesia    Woken up afterwards with panic attacks   COPD (chronic obstructive pulmonary disease) (HCC)    Depression    Diabetes mellitus 2002   Dyspnea    with exertion   GERD (gastroesophageal reflux disease)    Helicobacter pylori (H. pylori) infection    s/p triple therapy   Hepatic hemangioma    History of heart murmur in childhood    History of kidney stones    Hyperlipidemia    Hypertension    Hypertriglyceridemia    Panic attacks    mostly Agaraphobia   Peripheral arterial disease    Peripheral vascular disease    Pneumonia    Ventral hernia     Medications:  Medications Prior to Admission  Medication Sig Dispense Refill Last Dose/Taking    acetaminophen  (TYLENOL ) 650 MG CR tablet Take 1,300 mg by mouth every 8 (eight) hours as needed for pain.   Past Week   albuterol  (VENTOLIN  HFA) 108 (90 Base) MCG/ACT inhaler Inhale 2 puffs into the lungs every 6 (six) hours as needed. 6.7 g 6 Past Week   aspirin  EC 81 MG tablet Take 1 tablet (81 mg total) by mouth daily at 6 (six) AM. Swallow whole. 30 tablet 12 07/10/2024 Morning   atorvastatin  (LIPITOR) 80 MG tablet Take 1 tablet (80 mg total) by mouth daily. (Patient taking differently: Take 40 mg by mouth daily.) 90 tablet 2 07/10/2024 Morning   buPROPion  (WELLBUTRIN  SR) 150 MG 12 hr tablet Take 1 tablet (150 mg total) by mouth 2 (two) times daily. 60 tablet 3 07/10/2024 Evening   carvedilol  (COREG ) 6.25 MG tablet Take 1 tablet (6.25 mg total) by mouth 2 (two) times daily. 180 tablet 3 07/11/2024 Noon   cetirizine  (  ZYRTEC ) 10 MG tablet Take 10 mg by mouth daily as needed for allergies.   Unknown   cimetidine  (TAGAMET  HB 200) 200 MG tablet Take 200 mg by mouth daily as needed (for heartburn).   Unknown   citalopram  (CELEXA ) 40 MG tablet Take 1 tablet (40 mg total) by mouth daily. 90 tablet 3 07/10/2024 Morning   clopidogrel  (PLAVIX ) 75 MG tablet Take 1 tablet (75 mg total) by mouth daily. 90 tablet 3 07/11/2024 Morning   Continuous Glucose Receiver (DEXCOM G7 RECEIVER) DEVI Use with Dexcom G7 sensors to monitor your glucose continuously 1 each 1 Taking   Continuous Glucose Sensor (DEXCOM G7 SENSOR) MISC Place new sensor every 10 days. Use to monitor blood sugar continuously. 9 each 3 Taking   ezetimibe  (ZETIA ) 10 MG tablet Take 1 tablet (10 mg total) by mouth daily. 30 tablet 3 07/10/2024 Morning   Fluticasone -Umeclidin-Vilant (TRELEGY ELLIPTA ) 200-62.5-25 MCG/ACT AEPB Inhale 1 puff into the lungs daily. 60 each 9 07/10/2024 Morning   ibuprofen  (ADVIL ) 200 MG tablet Take 400 mg by mouth every 8 (eight) hours as needed for mild pain (pain score 1-3) or moderate pain (pain score 4-6).   Unknown   Insulin   Glargine (BASAGLAR  KWIKPEN) 100 UNIT/ML Inject 36 Units into the skin daily. 15 mL 6 07/10/2024 Evening   insulin  lispro (HUMALOG) 100 UNIT/ML injection Inject 6 Units into the skin 3 (three) times daily before meals.   07/10/2024 Evening   Insulin  Pen Needle (PEN NEEDLES) 32G X 4 MM MISC Use 3-4x a day while on insulin  therapy 100 each 11    lisinopril  (ZESTRIL ) 5 MG tablet Take 1 tablet (5 mg total) by mouth daily. 90 tablet 3 07/10/2024 Morning   gabapentin  (NEURONTIN ) 300 MG capsule Take 1 capsule (300 mg total) by mouth at bedtime. (Patient not taking: Reported on 07/09/2024) 30 capsule 2    glucose blood test strip Use as instructed 100 each 12    Insulin  Pen Needle (PEN NEEDLES) 31G X 5 MM MISC 1 each by Does not apply route daily. Use to inject insulin  100 each 0    nitroGLYCERIN  (NITROSTAT ) 0.4 MG SL tablet Place 1 tablet (0.4 mg total) under the tongue every 5 (five) minutes as needed for chest pain. (Patient not taking: Reported on 07/11/2024) 75 tablet 3 Not Taking   tirzepatide  (MOUNJARO ) 2.5 MG/0.5ML Pen Inject 2.5 mg into the skin once a week. (Patient not taking: Reported on 07/11/2024) 2 mL 1 Not Taking   Scheduled:   albuterol   3 mL Inhalation TID   [START ON 07/14/2024] aspirin   81 mg Oral Pre-Cath   aspirin  EC  81 mg Oral Q0600   atorvastatin   80 mg Oral Daily   budesonide -glycopyrrolate -formoterol   2 puff Inhalation BID   buPROPion   150 mg Oral BID   [START ON 07/14/2024] citalopram   20 mg Oral Daily   clopidogrel   75 mg Oral Daily   ezetimibe   10 mg Oral Daily   [START ON 07/14/2024] free water   500 mL Oral Once   furosemide   80 mg Intravenous BID   guaiFENesin   600 mg Oral BID   insulin  aspart  0-20 Units Subcutaneous Q4H   insulin  glargine-yfgn  26 Units Subcutaneous Daily   isosorbide  mononitrate  30 mg Oral Daily   lisinopril   5 mg Oral Daily   melatonin  3 mg Oral QHS   Infusions:   heparin  1,500 Units/hr (07/13/24 1058)   PRN: acetaminophen  **OR** acetaminophen ,  dextrose , hydrOXYzine , ipratropium-albuterol , nitroGLYCERIN , polyethylene  glycol  Assessment: 89 yof with a history of COPD, asthma, T2Dm, HTN, HLD, PAD. Patient is presenting with acute respiratory distress. Rising troponin noted in ED. Heparin  per pharmacy consult placed for chest pain/ACS.  Patient is not on anticoagulation prior to arrival.   1/18: Heparin  level is subtherapeutic at 0.16 on 1250 units/hr. CBC is stable (hgb 11.5, plt 340). No issues with the infusion or bleeding reported per RN. RN reported that patient stated that she has a history of polycythemia, which she was told makes it hard for me to be anticoagulated sometimes.  PM update: heparin  level 0.38 is therapeutic on 1500 units/hr. No issues with the infusion or bleeding reported.  Goal of Therapy:  Heparin  level 0.3-0.7 units/ml Monitor platelets by anticoagulation protocol: Yes   Plan:  Continue heparin  infusion to 1500 units/hr Check confirmatory heparin  level in 6 hours Monitor heparin  level, CBC, and signs of bleeding daily  Rocky Slade, PharmD, BCPS 07/13/2024 5:30 PM   Please check AMION for all [facility] Pharmacy phone numbers After 10:00 PM, call Main Pharmacy [phone]           [1]  Allergies Allergen Reactions   Jardiance  [Empagliflozin ] Other (See Comments)    Severe yeast infection   Latex Rash   Avandia [Rosiglitazone] Other (See Comments)    Headaches

## 2024-07-13 NOTE — H&P (View-Only) (Signed)
 "   Progress Note  Patient Name: Vanessa [name] Date of Encounter: 07/13/2024  Primary Cardiologist: Peter Jordan, MD  Subjective   SOB improved.  Urine output was not charted but she reported that she urinated a lot with IV Lasix .  Inpatient Medications    Scheduled Meds:  albuterol   3 mL Inhalation TID   aspirin  EC  81 mg Oral Q0600   atorvastatin   80 mg Oral Daily   budesonide -glycopyrrolate -formoterol   2 puff Inhalation BID   buPROPion   150 mg Oral BID   citalopram   40 mg Oral Daily   clopidogrel   75 mg Oral Daily   ezetimibe   10 mg Oral Daily   furosemide   80 mg Intravenous BID   guaiFENesin   600 mg Oral BID   insulin  aspart  0-20 Units Subcutaneous Q4H   insulin  glargine-yfgn  26 Units Subcutaneous Daily   isosorbide  mononitrate  30 mg Oral Daily   lisinopril   5 mg Oral Daily   melatonin  3 mg Oral QHS   Continuous Infusions:  heparin  1,250 Units/hr (07/13/24 0540)   PRN Meds: acetaminophen  **OR** acetaminophen , dextrose , hydrOXYzine , ipratropium-albuterol , nitroGLYCERIN , polyethylene glycol   Vital Signs    Vitals:   07/12/24 1900 07/12/24 1934 07/13/24 0417 07/13/24 0754  BP: 121/63  (!) 103/56 108/67  Pulse:  82 68 66  Resp: 15 15 18 17   Temp: 98.5 F (36.9 C)  98.2 F (36.8 C) 98.8 F (37.1 C)  TempSrc: Oral  Oral Oral  SpO2:   94% 94%  Weight:   74.4 kg   Height:        Intake/Output Summary (Last 24 hours) at 07/13/2024 1020 Last data filed at 07/13/2024 0418 Gross per 24 hour  Intake 498.99 ml  Output --  Net 498.99 ml   Filed Weights   07/12/24 1032 07/13/24 0417  Weight: 71.8 kg 74.4 kg    Telemetry     Personally reviewed.  NSR.  NSVT 1 run.  ECG   Not performed today.  Physical Exam   GEN: No acute distress.   Neck: Unable to examine due to body but is Cardiac: RRR, no murmur, rub, or gallop.  Respiratory: Nonlabored. Clear to auscultation bilaterally. GI: Soft, nontender, bowel sounds present. MS: No edema; No  deformity. Neuro:  Nonfocal. Psych: Alert and oriented x 3. Normal affect.  Labs    Chemistry Recent Labs  Lab 07/11/24 2203 07/12/24 0206 07/13/24 0634  NA 141 138 140  K 4.3 4.2 4.2  CL 105 103 104  CO2 25 25 26   GLUCOSE 249* 295* 101*  BUN 14 17 21   CREATININE 0.80 0.77 0.68  CALCIUM  8.9 8.8* 8.9  PROT  --  6.5  --   ALBUMIN  --  3.6  --   AST  --  65*  --   ALT  --  46*  --   ALKPHOS  --  124  --   BILITOT  --  0.2  --   GFRNONAA >60 >60 >60  ANIONGAP 12 10 10      Hematology Recent Labs  Lab 07/11/24 1755 07/11/24 1819 07/11/24 1934 07/12/24 0212 07/13/24 0634  WBC 19.6*  --   --  13.9* 19.2*  RBC 4.72  --   --  4.08 3.92  HGB 13.9   < > 13.6 12.0 11.5*  HCT 44.1   < > 40.0 37.4 36.6  MCV 93.4  --   --  91.7 93.4  MCH 29.4  --   --  29.4 29.3  MCHC 31.5  --   --  32.1 31.4  RDW 13.7  --   --  13.5 13.9  PLT 492*  --   --  305 340   < > = values in this interval not displayed.    Cardiac EnzymesNo results for input(s): TROPONINIHS in the last 720 hours.  BNP Recent Labs  Lab 07/11/24 1755  PROBNP 854.0*     DDimerNo results for input(s): DDIMER in the last 168 hours.    Assessment & Plan    NSTEMI Acute systolic and diastolic heart failure - Presented with sudden onset of SOB. - Denies having any angina. . But she had typical angina (burning chest pain with exertion and eating) that started in 02/2024 and kept gradually worsening. Occurs 3 times/week. She was originally scheduled for outpatient LHC on Monday, 07/14/2024.  - Chest x-ray showed pulmonary edema/congestion. - proBNP elevated, 854.  EKG showed NSR, no Q waves in the anterior leads. - Hs troponins elevated, peaked at 423. - Echo yesterday showed LVEF 35 to 40% with severe hypokinesis in the lateral wall. - Continue IV Lasix  80 mg BID.  Urine output was not charted while she was in the ER but she reported that she urinated a lot and that her SOB improved compared to yesterday. -  Continue Imdur  30 mg once daily and lisinopril  5 mg once daily. - Cardiac risk factors include HTN, DM2, PAD.  Vasculopathic. - Continue ACS protocol, heparin  drip.  On DAPT, high intensity statin and Zetia . - Avoid steroids.  Discontinue prednisone .  Do not think this is COPD exacerbation. - Schedule for LHC and RHC on Monday/07/14/2024.  Keep NPO after midnight.   Informed consent for LHC and RHC Risks and benefits of cardiac catheterization have been discussed with the patient.  These include bleeding, infection, kidney damage, stroke, heart attack, death.  The patient understands these risks and is willing to proceed.   HTN, controlled - Resume home antihypertensive medications.   DM 2, poorly controlled - Per primary team.   40 minutes spent in reviewing prior medical records, reports, more than 3 labs, discussion and documentation.  Signed, Diannah SHAUNNA Maywood, MD  07/13/2024, 10:20 AM    "

## 2024-07-13 NOTE — Progress Notes (Addendum)
 "  HD#1 SUBJECTIVE:  Patient Summary: Vanessa [name] is a 64 y.o. female with a pertinent PMH of chronic hypoxic respiratory failure on 2LNC, suspected COPD (no documented PFTs), HFmrEF, angina, PAD s/p multiple revascularizations, T2DM, HTN, HLD, TUD, depression, anxiety, and panic attacks who presented with SOB and admitted for acute hypoxic and hypercarbic respiratory failure d/t panic attack + HFrEF exacerbation.   Overnight Events: No events overnight.  Interim History: Patient still feels much better compared to initial presentation.  She recalls getting notified that the home O2 would be delivered and it happened so quickly that she started to have trouble breathing and felt like something was around her neck.  She reports that she had 2 episodes of anxiety yesterday which were well-controlled with hydroxyzine .  This morning, she is not short of breath.  Denies any chest pain and and thinks that strange given her elevation in troponins.  She did notice some blood after voiding when she wiped herself, but has not noticed any other bleeding symptoms.  This only occurred once, patient unsure if from urine or vagina.  She states that her son is driving here from [facility] and that her family were able to feed her bulldog.  OBJECTIVE:  Vital Signs: Vitals:   07/12/24 1900 07/12/24 1934 07/13/24 0417 07/13/24 0754  BP: 121/63  (!) 103/56 108/67  Pulse:  82 68 66  Resp: 15 15 18 17   Temp: 98.5 F (36.9 C)  98.2 F (36.8 C) 98.8 F (37.1 C)  TempSrc: Oral  Oral Oral  SpO2:   94% 94%  Weight:   74.4 kg   Height:        Filed Weights   07/12/24 1032 07/13/24 0417  Weight: 71.8 kg 74.4 kg     Intake/Output Summary (Last 24 hours) at 07/13/2024 1121 Last data filed at 07/13/2024 0418 Gross per 24 hour  Intake 498.99 ml  Output --  Net 498.99 ml   Net IO Since Admission: -251.98 mL [07/13/24 1121]  Physical Exam: Constitutional: Chronically ill-appearing female in no acute  distress HENT: normocephalic atraumatic, mucous membranes moist Eyes: conjunctiva non-erythematous, PERRL, no scleral icterus Cardiovascular: regular rate and rhythm, no m/r/g; JVD not appreciated but exam limited due to body habitus; right harsh carotid bruit heard Pulmonary/Chest: Normal work of breathing on humidified 3L [place], lungs clear to auscultation bilaterally - no wheezing, rhonchi, rales heard Abdominal: soft, non-tender, non-distended, bowel sounds normal Neurological: alert & oriented x3, moving all extremities equally Skin: warm and dry; linear vertical scar noted to medial aspect of left thigh Extremities: Warm and well-perfused  Psych: normal mood and affect, thought content normal  Patient Lines/Drains/Airways Status     Active Line/Drains/Airways     Name Placement date Placement time Site Days   Peripheral IV 07/11/24 18 G Anterior;Left Forearm 07/11/24  1803  Forearm  2   Peripheral IV 07/11/24 20 G Anterior;Proximal;Right Forearm 07/11/24  1903  Forearm  2            Pertinent labs and imaging:     Latest Ref Rng & Units 07/13/2024    6:34 AM 07/12/2024    2:12 AM 07/11/2024    7:34 PM  CBC  WBC 4.0 - 10.5 K/uL 19.2  13.9    Hemoglobin 12.0 - 15.0 g/dL 88.4  87.9  86.3   Hematocrit 36.0 - 46.0 % 36.6  37.4  40.0   Platelets 150 - 400 K/uL 340  305  Latest Ref Rng & Units 07/13/2024    6:34 AM 07/12/2024    2:06 AM 07/11/2024   10:03 PM  CMP  Glucose 70 - 99 mg/dL 898  704  750   BUN 8 - 23 mg/dL 21  17  14    Creatinine 0.44 - 1.00 mg/dL 9.31  9.22  9.19   Sodium 135 - 145 mmol/L 140  138  141   Potassium 3.5 - 5.1 mmol/L 4.2  4.2  4.3   Chloride 98 - 111 mmol/L 104  103  105   CO2 22 - 32 mmol/L 26  25  25    Calcium  8.9 - 10.3 mg/dL 8.9  8.8  8.9   Total Protein 6.5 - 8.1 g/dL  6.5    Total Bilirubin 0.0 - 1.2 mg/dL  0.2    Alkaline Phos 38 - 126 U/L  124    AST 15 - 41 U/L  65    ALT 0 - 44 U/L  46      No results  found.   ASSESSMENT/PLAN:  Assessment: Principal Problem:   COPD exacerbation (HCC) Active Problems:   Diabetes mellitus, type II, insulin  dependent (HCC)   Tobacco dependence   COPD (chronic obstructive pulmonary disease) (HCC)   Essential hypertension, benign   Ovarian cancer (HCC)   Pneumonia   Hyperglycemia   Asthma with acute exacerbation   Elevated brain natriuretic peptide (BNP) level   Generalized anxiety disorder   Vanessa [name] is a 64 y.o. female with a history of chronic hypoxic respiratory failure on 2LNC, suspected COPD (no documented PFTs), HFmrEF, angina, PAD s/p multiple revascularizations, T2DM, HTN, HLD, TUD, depression, anxiety, and panic attacks who presented with SOB and admitted for acute hypoxic and hypercarbic respiratory failure d/t panic attack + HFrEF exacerbation, now on hospital day 1.  Plan: #Acute hypoxic and hypercarbic respiratory failure 2/2 anxiety/panic attack #COPD with chronic hypoxia without exacerbation #HFrEF exacerbation Patient resting comfortably on humidified 3L [place].  No subjective SOB at this time.  Initial presentation with acute SOB and AMS in the setting of chronic hypoxic respiratory failure.  She was initially found to be hypercarbic on VBG which improved with BiPAP.  Not a chronic CO2 retainer as she did not show signs of metabolic compensation.  Her symptoms all started with acute anxiety/feelings of panic which improved after benzodiazepine and Precedex  in the ED.  She has continued to receive hydroxyzine  as needed which has managed her acute anxiety and her oxygen requirements returned closer to baseline.  Initially wheezing on presentation but this is now resolved.  Lungs are clear to auscultation bilaterally.  Respiratory failure induced by panic attack but also in the setting of new-onset HFrEF.  proBNP initially elevated in the 800s.  Updated TTE showing EF of 35-40% with LMWA and grade 2 diastolic dysfunction.  Continuing home  inhalers, anxiety medications.  Diuresing per cardiology. - Discontinue azithromycin  and prednisone  - SpO2 goal of 88-92% - DuoNebs every 4 hours as needed - Continue Breztri  inhaler (switched from home Trelegy Ellipta  while admitted) - Scheduled albuterol  inhaler 4 times daily - Lasix  80 mg twice daily per cardiology - Continue home citalopram  40 mg daily (educated patient on taking in the morning) - Strict I's/O's - Continuous cardiac monitoring - Trend BMPs while diuresing - Mucinex  600 mg twice daily  #NSTEMI #Angina No active chest pain on evaluation this morning.  Recently saw Dr. Jordan in outpatient setting with plans for [facility] on 07/14/24. Exam unrevealing.  Initial EKG without concerning ST/T wave abnormalities. Troponin peaked at 423, now downtrending. Takes ASA and Plavix  given extensive PAD as well as Lipitor and Zetia . Will continue these home medications.  Plan for [facility] and RHC on 1/19.  Cardiology consulted and following patient, appreciate further recommendations. - Cardiology consulted, appreciate recommendations  - Continue heparin , pharmacy to dose  - LHC + RHC planned for 1/19, n.p.o. at midnight  - Continue Imdur  30 mg daily  - Continue lisinopril  5 mg daily - Continue home ASA 81 mg daily - Continue home Clopidogrel  75 mg daily - Continue home Lipitor 80 mg daily - Continue home Zetia  10 mg daily  #Severe hyperglycemia #T2DM Glucose elevated to 500+ on admission, now downtrending to 250s. No elevation in beta-hydroxybutyrate, AG normal, no ketonuria. She was started on EndoTool in ED which has since been discontinued. Last A1c of 12.4% on 07/01/24. Currently taking Semglee  36 units daily, 6 units Humalog 3 times daily before meals, Mounjaro  2.5 mg weekly but has not started yet. Intolerant to SGLT2i due to mycotic infections and metformin  due to GI distress. Hyperglycemia on admission likely severe hyperglycemia vs HHS. Rapid improvement with EndoTool and now on SQ  insulin . Will continue resistant SSI and continue Semglee . - Resistant SSI - Continue Semglee  26 units - Carb-modified diet - Trend CBGs  #Peripheral Arterial Disease s/p multiple revascularizations #Right Carotid Bruit Follows with Dr. Serene with vascular surgery. S/p right axillary to common femoral and left femoral to popliteal bypass grafts. No acute concerns during this admission. Patient not having any leg or foot pain. Extremities are warm and well-perfused. She declined further foot exam to assess previous ulcers. Her severe PAD places her at higher risk for atherosclerotic disease elsewhere and she is currently undergoing further workup for CAD and carotid atherosclerosis given bruit heard by Cardiology at outpatient appt. Will continue to monitor for active symptoms. 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 Viktoria.  CEA s/p procedures was elevated at 6.44 but still decreasing from prior value of 14.40.  She was recommended to follow-up with GI for further workup, but do not see any definitive documentation of a follow-up visit.  Will encourage patient to follow-up with GI and we will consider repeating CEA.  No acute concerns during this admission.  #HTN BP better controlled with lisinopril . Home meds are Lisinopril  5 mg and Carvedilol  6.25 mg bid. - Continue home Lisinopril  5 mg daily  #HLD Last LDL of 182 and HDL 33 on 07/01/2024.  Currently taking Lipitor 80 mg daily and Zetia  10 mg daily.  Per cardiology, patient is pending referral to lipid clinic to assess need for Repatha in the outpatient setting. - Continue home Lipitor and Zetia  as above - Follow-up outpatient in lipid clinic pending referral  #Depression On evaluation this morning patient had normal affect and mood.  Not currently experiencing anxiety or feelings of panic. Continuing home medications. - Continue citalopram  40 mg daily - Continue Wellbutrin  SR 150 mg twice daily  #Tobacco Use Disorder Currently in remission.  Patient with 43-pack-year history, quit 1 year ago.  Now currently vapes nicotine  daily.  Currently on Wellbutrin  which will also help with cravings.  Counseled patient on cessation of vaping. Will offer nicotine  patch if she has cravings while admitted. - Continue Wellbutrin  SR 150 mg  twice daily   Best Practice: Diet: Carb-modified, NPO @ MN IVF: Fluids: None, Rate: None VTE: Heparin  Code: Full  Disposition planning: Therapy Recs: Pending, DME: none Family Contact: Eleanor Graff (Daughter), to be notified. DISPO: Anticipated discharge to Home pending clinical improvement and further workup.  Signature:  Letha Cheadle, MD [facility]  PGY-1 07/13/2024, 11:21 AM  On Call pager [phone]  "

## 2024-07-13 NOTE — Progress Notes (Signed)
 PHARMACY - ANTICOAGULATION CONSULT NOTE  Pharmacy Consult for heparin  Indication: chest pain/ACS  Labs: Recent Labs    07/11/24 1755 07/11/24 1819 07/11/24 1934 07/11/24 2203 07/12/24 0206 07/12/24 0212 07/12/24 1817 07/13/24 0325  HGB 13.9 15.0  15.0 13.6  --   --  12.0  --   --   HCT 44.1 44.0  44.0 40.0  --   --  37.4  --   --   PLT 492*  --   --   --   --  305  --   --   HEPARINUNFRC  --   --   --   --   --   --  0.22* 0.19*  CREATININE 0.80 0.70  --  0.80 0.77  --   --   --   Assessment: 63yo female subtherapeutic on heparin  with lower level despite increased rate; no infusion issues or signs of bleeding per RN.  Goal of Therapy:  Heparin  level 0.3-0.7 units/ml   Plan:  Increase heparin  infusion by 3 units/kgABW/hr to 1250 units/hr. Check level in 6 hours.   Marvetta Dauphin, PharmD, BCPS 07/13/2024 4:10 AM

## 2024-07-13 NOTE — Evaluation (Signed)
 Physical Therapy Evaluation Patient Details Name: Vanessa [name] MRN: [id number] DOB: April 06, 1961 Today's Date: 07/13/2024  History of Present Illness  64 y.o. female presenting 1/16 with SOB. Work up revealed hyperglycemia >500, mall bilateral pleural effusions, and elevated troponin. Admitted for management of COPD exacerbation, NSTEMI, and acute heart failure. PMH includes: COPD on 2L, asthma, poorly controlled DM II, HTN, HLD, severe PAD s/p revascularizations, and depression/anxiety/panic attacks.  Clinical Impression  Pt in bed upon arrival of PT, agreeable to evaluation at this time. Prior to admission the pt was ambulating without DME, reports single fall on the stairs, but otherwise independent at home on 2L O2. The pt now presents with deficits in dynamic stability and activity tolerance and will continue to benefit from skilled PT acutely to progress functional endurance and independence. Pt able to complete sit-stand transfers and unchallenged gait with supervision, demos increased sway during turns and SpO2 to low of 84% after 40 ft ambulation on 2L. Will continue to progress activity tolerance and mobility as tolerated, no new DME needed at this time.     If plan is discharge home, recommend the following: A little help with walking and/or transfers;Assist for transportation;Help with stairs or ramp for entrance   Can travel by private vehicle        Equipment Recommendations None recommended by PT  Recommendations for Other Services       Functional Status Assessment Patient has had a recent decline in their functional status and demonstrates the ability to make significant improvements in function in a reasonable and predictable amount of time.     Precautions / Restrictions Precautions Precautions: Fall Recall of Precautions/Restrictions: Intact Precaution/Restrictions Comments: watch O2 Restrictions Weight Bearing Restrictions Per Provider Order: No      Mobility  Bed  Mobility Overal bed mobility: Independent             General bed mobility comments: slightly increased time, no assist    Transfers Overall transfer level: Needs assistance Equipment used: None Transfers: Sit to/from Stand Sit to Stand: Supervision           General transfer comment: stable without assist    Ambulation/Gait Ambulation/Gait assistance: Supervision Gait Distance (Feet): 40 Feet Assistive device: None Gait Pattern/deviations: Step-through pattern, Decreased stride length Gait velocity: decreased Gait velocity interpretation: <1.31 ft/sec, indicative of household ambulator   General Gait Details: slowed gait with mild sway on turns, SpO2 to 84% on 2L after mobility, recovered after 30 sec seated rest     Balance Overall balance assessment: Needs assistance Sitting-balance support: No upper extremity supported, Feet supported Sitting balance-Leahy Scale: Normal     Standing balance support: No upper extremity supported, During functional activity Standing balance-Leahy Scale: Fair Standing balance comment: poor tolerance for dynamic challenge, but stable without UE support during static stance and unchallenged gait                             Pertinent Vitals/Pain Pain Assessment Pain Assessment: No/denies pain    Home Living Family/patient expects to be discharged to:: Private residence Living Arrangements: Children Available Help at Discharge: Family (son) Type of Home: Mobile home Home Access: Stairs to enter Entrance Stairs-Rails: None Entrance Stairs-Number of Steps: 3   Home Layout: One level Home Equipment: Agricultural Consultant (2 wheels);Shower seat;BSC/3in1;Wheelchair - manual;Grab bars - tub/shower;Grab bars - toilet;Hand held shower head;Transport chair;Cane - single point;Crutches;Rollator (4 wheels)      Prior Function  Prior Level of Function : Independent/Modified Independent             Mobility Comments: no DME  for short distances, 2L O2, one fall coming up stairs to enter house ADLs Comments: Does use the shower chair when bathing.     Extremity/Trunk Assessment   Upper Extremity Assessment Upper Extremity Assessment: Overall WFL for tasks assessed    Lower Extremity Assessment Lower Extremity Assessment: Overall WFL for tasks assessed    Cervical / Trunk Assessment Cervical / Trunk Assessment: Normal  Communication   Communication Communication: No apparent difficulties    Cognition Arousal: Alert Behavior During Therapy: WFL for tasks assessed/performed   PT - Cognitive impairments: No apparent impairments                       PT - Cognition Comments: following commands appropriately, not formally assessed. Following commands: Intact       Cueing Cueing Techniques: Verbal cues     General Comments General comments (skin integrity, edema, etc.): SpO2 95% on 2L at rest, low of 87% when [place] out of pt's nose. Low of 84% after walking 40 ft on 2L    Exercises     Assessment/Plan    PT Assessment Patient needs continued PT services  PT Problem List Cardiopulmonary status limiting activity;Decreased activity tolerance;Decreased balance;Decreased mobility       PT Treatment Interventions DME instruction;Gait training;Stair training;Functional mobility training;Therapeutic activities;Therapeutic exercise;Balance training;Patient/family education    PT Goals (Current goals can be found in the Care Plan section)  Acute Rehab PT Goals Patient Stated Goal: to return home PT Goal Formulation: With patient Time For Goal Achievement: 07/27/24 Potential to Achieve Goals: Good    Frequency Min 2X/week        AM-PAC PT 6 Clicks Mobility  Outcome Measure Help needed turning from your back to your side while in a flat bed without using bedrails?: None Help needed moving from lying on your back to sitting on the side of a flat bed without using bedrails?: None Help  needed moving to and from a bed to a chair (including a wheelchair)?: A Little Help needed standing up from a chair using your arms (e.g., wheelchair or bedside chair)?: A Little Help needed to walk in hospital room?: A Little Help needed climbing 3-5 steps with a railing? : A Little 6 Click Score: 20    End of Session Equipment Utilized During Treatment: Gait belt;Oxygen Activity Tolerance: Patient tolerated treatment well Patient left: in bed;with call bell/phone within reach Nurse Communication: Mobility status;Other (comment) (SpO2 to 84% on 2L after gait) PT Visit Diagnosis: Unsteadiness on feet (R26.81)    Time: 1345-1400 PT Time Calculation (min) (ACUTE ONLY): 15 min   Charges:   PT Evaluation $PT Eval Low Complexity: 1 Low   PT General Charges $$ ACUTE PT VISIT: 1 Visit         Izetta Call, PT, DPT   Acute Rehabilitation Department Office [phone] Secure Chat Communication Preferred  Izetta JULIANNA Call 07/13/2024, 2:55 PM

## 2024-07-13 NOTE — Progress Notes (Signed)
 VASCULAR LAB    Carotid duplex has been performed.  See CV proc for preliminary results.   Osamah Schmader, RVT 07/13/2024, 3:53 PM

## 2024-07-14 ENCOUNTER — Ambulatory Visit (HOSPITAL_COMMUNITY): Admission: RE | Admit: 2024-07-14 | Source: Home / Self Care | Admitting: Cardiology

## 2024-07-14 ENCOUNTER — Encounter (HOSPITAL_COMMUNITY): Admission: EM | Disposition: A | Payer: Self-pay | Source: Home / Self Care | Attending: Internal Medicine

## 2024-07-14 DIAGNOSIS — J9602 Acute respiratory failure with hypercapnia: Secondary | ICD-10-CM | POA: Diagnosis not present

## 2024-07-14 DIAGNOSIS — J9601 Acute respiratory failure with hypoxia: Secondary | ICD-10-CM | POA: Diagnosis not present

## 2024-07-14 DIAGNOSIS — I110 Hypertensive heart disease with heart failure: Secondary | ICD-10-CM | POA: Diagnosis not present

## 2024-07-14 DIAGNOSIS — I25110 Atherosclerotic heart disease of native coronary artery with unstable angina pectoris: Secondary | ICD-10-CM

## 2024-07-14 DIAGNOSIS — I5020 Unspecified systolic (congestive) heart failure: Secondary | ICD-10-CM | POA: Diagnosis not present

## 2024-07-14 DIAGNOSIS — I5021 Acute systolic (congestive) heart failure: Secondary | ICD-10-CM

## 2024-07-14 HISTORY — PX: RIGHT/LEFT HEART CATH AND CORONARY ANGIOGRAPHY: CATH118266

## 2024-07-14 LAB — POCT I-STAT EG7
Acid-Base Excess: 4 mmol/L — ABNORMAL HIGH (ref 0.0–2.0)
Acid-Base Excess: 3 mmol/L — ABNORMAL HIGH (ref 0.0–2.0)
Bicarbonate: 31.7 mmol/L — ABNORMAL HIGH (ref 20.0–28.0)
Bicarbonate: 30.7 mmol/L — ABNORMAL HIGH (ref 20.0–28.0)
Calcium, Ion: 1.25 mmol/L (ref 1.15–1.40)
Calcium, Ion: 1.2 mmol/L (ref 1.15–1.40)
HCT: 37 % (ref 36.0–46.0)
HCT: 35 % — ABNORMAL LOW (ref 36.0–46.0)
Hemoglobin: 12.6 g/dL (ref 12.0–15.0)
Hemoglobin: 11.9 g/dL — ABNORMAL LOW (ref 12.0–15.0)
O2 Saturation: 60 %
O2 Saturation: 59 %
Potassium: 3.9 mmol/L (ref 3.5–5.1)
Potassium: 3.7 mmol/L (ref 3.5–5.1)
Sodium: 142 mmol/L (ref 135–145)
Sodium: 140 mmol/L (ref 135–145)
TCO2: 34 mmol/L — ABNORMAL HIGH (ref 22–32)
TCO2: 33 mmol/L — ABNORMAL HIGH (ref 22–32)
pCO2, Ven: 61.4 mmHg — ABNORMAL HIGH (ref 44–60)
pCO2, Ven: 60.9 mmHg — ABNORMAL HIGH (ref 44–60)
pH, Ven: 7.321 (ref 7.25–7.43)
pH, Ven: 7.311 (ref 7.25–7.43)
pO2, Ven: 35 mmHg (ref 32–45)
pO2, Ven: 34 mmHg (ref 32–45)

## 2024-07-14 LAB — POCT I-STAT 7, (LYTES, BLD GAS, ICA,H+H)
Acid-Base Excess: 3 mmol/L — ABNORMAL HIGH (ref 0.0–2.0)
Bicarbonate: 29.6 mmol/L — ABNORMAL HIGH (ref 20.0–28.0)
Calcium, Ion: 1.17 mmol/L (ref 1.15–1.40)
HCT: 34 % — ABNORMAL LOW (ref 36.0–46.0)
Hemoglobin: 11.6 g/dL — ABNORMAL LOW (ref 12.0–15.0)
O2 Saturation: 91 %
Potassium: 3.6 mmol/L (ref 3.5–5.1)
Sodium: 143 mmol/L (ref 135–145)
TCO2: 31 mmol/L (ref 22–32)
pCO2 arterial: 54.7 mmHg — ABNORMAL HIGH (ref 32–48)
pH, Arterial: 7.342 — ABNORMAL LOW (ref 7.35–7.45)
pO2, Arterial: 65 mmHg — ABNORMAL LOW (ref 83–108)

## 2024-07-14 LAB — HEPARIN LEVEL (UNFRACTIONATED)
Heparin Unfractionated: 0.33 [IU]/mL (ref 0.30–0.70)
Heparin Unfractionated: 0.46 [IU]/mL (ref 0.30–0.70)

## 2024-07-14 LAB — CBC
HCT: 35.6 % — ABNORMAL LOW (ref 36.0–46.0)
Hemoglobin: 11.5 g/dL — ABNORMAL LOW (ref 12.0–15.0)
MCH: 29.9 pg (ref 26.0–34.0)
MCHC: 32.3 g/dL (ref 30.0–36.0)
MCV: 92.7 fL (ref 80.0–100.0)
Platelets: 337 K/uL (ref 150–400)
RBC: 3.84 MIL/uL — ABNORMAL LOW (ref 3.87–5.11)
RDW: 13.8 % (ref 11.5–15.5)
WBC: 13.3 K/uL — ABNORMAL HIGH (ref 4.0–10.5)
nRBC: 0 % (ref 0.0–0.2)

## 2024-07-14 LAB — BASIC METABOLIC PANEL WITH GFR
Anion gap: 13 (ref 5–15)
BUN: 24 mg/dL — ABNORMAL HIGH (ref 8–23)
CO2: 25 mmol/L (ref 22–32)
Calcium: 9 mg/dL (ref 8.9–10.3)
Chloride: 103 mmol/L (ref 98–111)
Creatinine, Ser: 0.7 mg/dL (ref 0.44–1.00)
GFR, Estimated: >60 mL/min
Glucose, Bld: 89 mg/dL (ref 70–99)
Potassium: 4 mmol/L (ref 3.5–5.1)
Sodium: 140 mmol/L (ref 135–145)

## 2024-07-14 LAB — GLUCOSE, CAPILLARY
Glucose-Capillary: 143 mg/dL — ABNORMAL HIGH (ref 70–99)
Glucose-Capillary: 271 mg/dL — ABNORMAL HIGH (ref 70–99)
Glucose-Capillary: 74 mg/dL (ref 70–99)
Glucose-Capillary: 83 mg/dL (ref 70–99)
Glucose-Capillary: 90 mg/dL (ref 70–99)
Glucose-Capillary: 98 mg/dL (ref 70–99)

## 2024-07-14 MED ORDER — FENTANYL CITRATE (PF) 100 MCG/2ML IJ SOLN
INTRAMUSCULAR | Status: DC | PRN
  Administered 2024-07-14 (×2): 25 ug via INTRAVENOUS

## 2024-07-14 MED ORDER — SODIUM CHLORIDE 0.9% FLUSH: 3.0000 mL | Freq: Two times a day (BID) | INTRAVENOUS | Status: DC

## 2024-07-14 MED ORDER — ONDANSETRON HCL 4 MG/2ML IJ SOLN: 4.0000 mg | Freq: Four times a day (QID) | INTRAMUSCULAR | Status: DC | PRN

## 2024-07-14 MED ORDER — METOPROLOL SUCCINATE ER 25 MG PO TB24
25.0000 mg | ORAL_TABLET | Freq: Every day | ORAL | Status: DC
  Administered 2024-07-14 – 2024-07-17 (×4): 25 mg via ORAL
  Filled 2024-07-14 (×4): qty 1

## 2024-07-14 MED ORDER — SODIUM CHLORIDE 0.9% FLUSH
3.0000 mL | Freq: Two times a day (BID) | INTRAVENOUS | Status: DC
  Administered 2024-07-14 – 2024-07-17 (×7): 3 mL via INTRAVENOUS

## 2024-07-14 MED ORDER — HEPARIN SODIUM (PORCINE) 1000 UNIT/ML IJ SOLN
INTRAMUSCULAR | Status: AC
  Filled 2024-07-14: qty 10

## 2024-07-14 MED ORDER — FREE WATER
250.0000 mL | Freq: Once | Status: AC
  Administered 2024-07-14: 250 mL via ORAL

## 2024-07-14 MED ORDER — FENTANYL CITRATE (PF) 100 MCG/2ML IJ SOLN
INTRAMUSCULAR | Status: AC
  Filled 2024-07-14: qty 2

## 2024-07-14 MED ORDER — LIDOCAINE HCL (PF) 1 % IJ SOLN
INTRAMUSCULAR | Status: AC
  Filled 2024-07-14: qty 30

## 2024-07-14 MED ORDER — VERAPAMIL HCL 2.5 MG/ML IV SOLN
INTRAVENOUS | Status: AC
  Filled 2024-07-14: qty 2

## 2024-07-14 MED ORDER — SODIUM CHLORIDE 0.9% FLUSH: 3.0000 mL | INTRAVENOUS | Status: DC | PRN

## 2024-07-14 MED ORDER — HEPARIN (PORCINE) IN NACL 1000-0.9 UT/500ML-% IV SOLN
INTRAVENOUS | Status: DC | PRN
  Administered 2024-07-14: 500 mL

## 2024-07-14 MED ORDER — SODIUM CHLORIDE 0.9 % IV SOLN: 250.0000 mL | INTRAVENOUS | Status: AC | PRN

## 2024-07-14 MED ORDER — HYDRALAZINE HCL 20 MG/ML IJ SOLN: 10.0000 mg | INTRAMUSCULAR | Status: AC | PRN

## 2024-07-14 MED ORDER — SODIUM CHLORIDE 0.9 % IV SOLN: 250.0000 mL | INTRAVENOUS | Status: DC | PRN

## 2024-07-14 MED ORDER — MIDAZOLAM HCL 2 MG/2ML IJ SOLN
INTRAMUSCULAR | Status: AC
  Filled 2024-07-14: qty 2

## 2024-07-14 MED ORDER — LIDOCAINE HCL (PF) 1 % IJ SOLN
INTRAMUSCULAR | Status: DC | PRN
  Administered 2024-07-14 (×2): 2 mL

## 2024-07-14 MED ORDER — HEPARIN SODIUM (PORCINE) 5000 UNIT/ML IJ SOLN
5000.0000 [IU] | Freq: Three times a day (TID) | INTRAMUSCULAR | Status: DC
  Administered 2024-07-14 – 2024-07-17 (×8): 5000 [IU] via SUBCUTANEOUS
  Filled 2024-07-14 (×8): qty 1

## 2024-07-14 MED ORDER — INSULIN GLARGINE-YFGN 100 UNIT/ML ~~LOC~~ SOLN
20.0000 [IU] | Freq: Every day | SUBCUTANEOUS | Status: DC
  Filled 2024-07-14 (×2): qty 0.2

## 2024-07-14 MED ORDER — MIDAZOLAM HCL (PF) 2 MG/2ML IJ SOLN
INTRAMUSCULAR | Status: DC | PRN
  Administered 2024-07-14 (×2): 1 mg via INTRAVENOUS

## 2024-07-14 MED ORDER — VERAPAMIL HCL 2.5 MG/ML IV SOLN
INTRAVENOUS | Status: DC | PRN
  Administered 2024-07-14: 10 mL via INTRA_ARTERIAL

## 2024-07-14 MED ORDER — HEPARIN SODIUM (PORCINE) 1000 UNIT/ML IJ SOLN
INTRAMUSCULAR | Status: DC | PRN
  Administered 2024-07-14: 3500 [IU] via INTRAVENOUS

## 2024-07-14 NOTE — Progress Notes (Signed)
 "  Progress Note  Patient Name: Vanessa [name] Date of Encounter: 07/14/2024 [facility] Cardiologist: Peter Jordan, MD   Interval Summary   Doing well post-cath. TR band in place, denies any significant pain. Continues to have some dyspnea near baseline, currently on home oxygen requirements. Denies any chest pain, palpitations, or lower extremity swelling at this time   Vital Signs Vitals:   07/13/24 2052 07/14/24 0007 07/14/24 0353 07/14/24 0915  BP:  133/64  117/73  Pulse:  70    Resp:  18 18 16   Temp:  98.2 F (36.8 C) 98.1 F (36.7 C) 98 F (36.7 C)  TempSrc:  Oral Oral Oral  SpO2: 95%     Weight:   72.2 kg   Height:        Intake/Output Summary (Last 24 hours) at 07/14/2024 1039 Last data filed at 07/14/2024 0500 Gross per 24 hour  Intake 1215.67 ml  Output 2600 ml  Net -1384.33 ml      07/14/2024    3:53 AM 07/13/2024    4:17 AM 07/12/2024   10:32 AM  Last 3 Weights  Weight (lbs) 159 lb 1.6 oz 164 lb 158 lb 4.6 oz  Weight (kg) 72.167 kg 74.39 kg 71.8 kg      Telemetry/ECG  Sinus rhythm, rates in the 60s - Personally Reviewed  Physical Exam  GEN: Resting comfortably, on 2L/min O2 via [place], in no acute distress.   Neck: No JVD Cardiac: RRR, no murmurs, rubs, or gallops.  Respiratory: Clear to auscultation bilaterally. GI: Soft, nontender MS: TR band in place, normal cap refill. No peripheral edema  Assessment & Plan  NSTEMI Presented with sudden onset SOB, denied any angina but had typical angina (burning chest pain with exertion and eating) that started in September 2025 that gradually worsened. Frequency 3x/week. Was originally scheduled for outpatient LHC on Monday, 07/14/24 Hs-troponins elevated, peaked at 423 proBNP elevated 854 EKG showed NSR, no Q waves in anterior leads Echo showed LVEF 35-40% with severe HK of entire lateral wall, new change compared to prior echo R/LHC this morning showed 3-vessel obstructive CAD/diffuse diabetic disease with  high LV filling pressures (PCWP 26/27, mean 30 mmHg, LVEDP 40 mmHg), moderate pulmonary HTN (PAP 47/26, mean 35 mmHg) Poor CABG candidate due to multiple comorbidities, angina likely to improve with improvement in LV pressures. Will optimize CHF therapy and manage CAD medically Continue ASA, Plavix , atorvastatin  80 mg daily, Zetia , Imdur  30 mg daily Start metoprolol  succinate 25 mg daily  Acute systolic and diastolic heart failure Presentation as above Echo showed LVEF 35-40%, RWMA (severe HK of entire lateral wall), G2DD, normal RV systolic function, dilated IVC RHC this morning showed elevated filling pressures (PCWP 26/27, mean 30 mmHg, LVEDP 40 mmHg. PAP 47/26, mean 35 mmHg. RA pressure 17/16, mean 14 mmHg.) Net I/O -1.396 L since admission, with -1.144 L in the past 24 hours Weight 74.4 kg >> 72.2 kg Will defer SGLT2i for now given uncontrolled diabetes. Known intolerance to Jardiance  due to history of severe yeast infection Discontinue lisinopril  in favor for Entresto, will need 36 hour washout period before starting Entresto Will start metoprolol  succinate 25 mg daily Given high filling pressures, will continue aggressive diuresis with IV Lasix  80 mg BID  Hypertension Controlled Start metoprolol  succinate 25 mg daily Lisinopril  discontinued for washout period prior to starting Entresto  PAD s/p multiple revascularizations Follows with vascular surgery (Dr. Serene) S/p right axillary to common femoral and left femoral to popliteal  bypass grafts Carotid US  showed 1-39% stenosis in left ICA and 40-59% in right ICA, no indications for CEA at this time Continue home ASA and Plavix  Ongoing management per primary  Per primary Chronic hypoxic respiratory failure Type 2 diabetes mellitus COPD Depression/anxiety/panic attacks   For questions or updates, please contact [facility] Please consult [email or web address] for contact info under         Signed, Owen MARLA Daniels, PA-C   "

## 2024-07-14 NOTE — Progress Notes (Addendum)
 PHARMACY - ANTICOAGULATION CONSULT NOTE  Pharmacy Consult for continuous infusion unfractionated heparin  Indication: chest pain/ACS  Allergies[1]  Patient Measurements: Height: 5' 2 (157.5 cm) Weight: 72.2 kg (159 lb 1.6 oz) IBW/kg (Calculated) : 50.1 HEPARIN  DW (KG): 65.4  Vital Signs: Temp: 98.1 F (36.7 C) (01/19 0353) Temp Source: Oral (01/19 0353) BP: 133/64 (01/19 0007) Pulse Rate: 70 (01/19 0007)  Labs: Recent Labs    07/12/24 0206 07/12/24 9787 07/12/24 1817 07/13/24 9365 07/13/24 0925 07/13/24 1658 07/13/24 2326 07/14/24 0347  HGB  --  12.0  --  11.5*  --   --   --  11.5*  HCT  --  37.4  --  36.6  --   --   --  35.6*  PLT  --  305  --  340  --   --   --  337  HEPARINUNFRC  --   --    < >  --    < > 0.38 0.33 0.46  CREATININE 0.77  --   --  0.68  --   --   --  0.70   < > = values in this interval not displayed.    Estimated Creatinine Clearance: 66.9 mL/min (by C-G formula based on SCr of 0.7 mg/dL).   Medical History: Past Medical History:  Diagnosis Date   Asthma    No PFT performed   Blood transfusion without reported diagnosis    has to donate blood due to having thick blood.   Complication of anesthesia    Woken up afterwards with panic attacks   COPD (chronic obstructive pulmonary disease) (HCC)    Depression    Diabetes mellitus 2002   Dyspnea    with exertion   GERD (gastroesophageal reflux disease)    Helicobacter pylori (H. pylori) infection    s/p triple therapy   Hepatic hemangioma    History of heart murmur in childhood    History of kidney stones    Hyperlipidemia    Hypertension    Hypertriglyceridemia    Panic attacks    mostly Agaraphobia   Peripheral arterial disease    Peripheral vascular disease    Pneumonia    Ventral hernia     Medications:  Scheduled:   albuterol   3 mL Inhalation TID   aspirin  EC  81 mg Oral Q0600   atorvastatin   80 mg Oral Daily   budesonide -glycopyrrolate -formoterol   2 puff Inhalation  BID   buPROPion   150 mg Oral BID   citalopram   20 mg Oral Daily   clopidogrel   75 mg Oral Daily   ezetimibe   10 mg Oral Daily   furosemide   80 mg Intravenous BID   guaiFENesin   600 mg Oral BID   insulin  aspart  0-20 Units Subcutaneous Q4H   insulin  glargine-yfgn  20 Units Subcutaneous Daily   isosorbide  mononitrate  30 mg Oral Daily   lisinopril   5 mg Oral Daily   melatonin  3 mg Oral QHS   sodium chloride  flush  3 mL Intravenous Q12H    Assessment: HL at 0347 hours is 0.46 units/mL on 1500 units of heparin  per hour. Goal of Therapy:  Heparin  level 0.3-0.7 units/ml Monitor platelets by anticoagulation protocol: Yes   Plan:  Check anti-Xa level in daily hours and daily while on heparin .   Lynwood KATHEE Lites, PharmD, CPP Clinical Pharmacist Practitioner 07/14/2024,7:08 AM      [1]  Allergies Allergen Reactions   Jardiance  [Empagliflozin ] Other (See Comments)    Severe  yeast infection   Latex Rash   Avandia [Rosiglitazone] Other (See Comments)    Headaches

## 2024-07-14 NOTE — Progress Notes (Signed)
 OT Cancellation Note  Patient Details Name: Vanessa [name] MRN: [id number] DOB: 1960/10/09   Cancelled Treatment:    Reason Eval/Treat Not Completed: Patient at procedure or test/ unavailable.   [name] FREDERICK, OTR/L [facility] Acute Rehabilitation Office: [phone]   [name] 07/14/2024, 7:55 AM

## 2024-07-14 NOTE — Progress Notes (Signed)
 "  HD#2 SUBJECTIVE:  Patient Summary: Vanessa [name] is a 64 y.o. female with a pertinent PMH of chronic hypoxic respiratory failure on 2LNC, suspected COPD (no documented PFTs), HFmrEF, angina, PAD s/p multiple revascularizations, T2DM, HTN, HLD, TUD, depression, anxiety, and panic attacks who presented with SOB and admitted for acute hypoxic and hypercarbic respiratory failure d/t panic attack + HFrEF exacerbation.   Overnight Events: No events overnight.  Interim History: Patient reports feeling well today. She is okay and is just waiting for her procedure. Denies any chest pain or shortness of breath at this time. The hydroxyzine  really helps her when she is feeling anxious. Did not mention any other panic episodes since yesterday. She has not noticed any further bleeding after urination, it was only a one time occurrence. Discussed with her that I reduced the dose of her Celexa  (citalopram ) due to an increased risk of arrhythmias. She acknowledged and agreed with the change. Shared the results of the carotid US  study with her. She states that after her procedures for her ovarian cancer, she was told to follow up with GI for the elevated CEA but thinks she never did due to prohibitive cost.  OBJECTIVE:  Vital Signs: Vitals:   07/13/24 2012 07/13/24 2052 07/14/24 0007 07/14/24 0353  BP: (!) 124/55  133/64   Pulse:   70   Resp: 18  18 18   Temp: 98.6 F (37 C)  98.2 F (36.8 C) 98.1 F (36.7 C)  TempSrc: Oral  Oral Oral  SpO2:  95%    Weight:    72.2 kg  Height:        Filed Weights   07/12/24 1032 07/13/24 0417 07/14/24 0353  Weight: 71.8 kg 74.4 kg 72.2 kg     Intake/Output Summary (Last 24 hours) at 07/14/2024 0616 Last data filed at 07/14/2024 0500 Gross per 24 hour  Intake 1455.67 ml  Output 2600 ml  Net -1144.33 ml   Net IO Since Admission: -1,396.31 mL [07/14/24 0616]  Physical Exam: Constitutional: Chronically ill-appearing female in no acute distress HENT:  normocephalic atraumatic, mucous membranes moist Eyes: conjunctiva non-erythematous, PERRL, no scleral icterus Cardiovascular: regular rate and rhythm; + systolic ejection murmur heard best in LUSB.   Pulmonary/Chest: Normal work of breathing on humidified HFNC 3L, lungs clear to auscultation bilaterally - no wheezing, rhonchi, rales heard Abdominal: soft, non-tender, non-distended, bowel sounds normal Neurological: alert & oriented x3, moving all extremities equally Skin: warm and dry; linear vertical scar noted to medial aspect of left thigh Extremities: Warm and well-perfused, no edema noted bilaterally Psych: normal mood and affect, thought content normal  Patient Lines/Drains/Airways Status     Active Line/Drains/Airways     Name Placement date Placement time Site Days   Peripheral IV 07/11/24 18 G Anterior;Left Forearm 07/11/24  1803  Forearm  3   Peripheral IV 07/11/24 20 G Anterior;Proximal;Right Forearm 07/11/24  1903  Forearm  3   Peripheral IV 07/14/24 20 G 1.25 Right Antecubital 07/14/24  0535  Antecubital  less than 1            Pertinent labs and imaging:     Latest Ref Rng & Units 07/14/2024    3:47 AM 07/13/2024    6:34 AM 07/12/2024    2:12 AM  CBC  WBC 4.0 - 10.5 K/uL 13.3  19.2  13.9   Hemoglobin 12.0 - 15.0 g/dL 88.4  88.4  87.9   Hematocrit 36.0 - 46.0 % 35.6  36.6  37.4  Platelets 150 - 400 K/uL 337  340  305        Latest Ref Rng & Units 07/14/2024    3:47 AM 07/13/2024    6:34 AM 07/12/2024    2:06 AM  CMP  Glucose 70 - 99 mg/dL 89  898  704   BUN 8 - 23 mg/dL 24  21  17    Creatinine 0.44 - 1.00 mg/dL 9.29  9.31  9.22   Sodium 135 - 145 mmol/L 140  140  138   Potassium 3.5 - 5.1 mmol/L 4.0  4.2  4.2   Chloride 98 - 111 mmol/L 103  104  103   CO2 22 - 32 mmol/L 25  26  25    Calcium  8.9 - 10.3 mg/dL 9.0  8.9  8.8   Total Protein 6.5 - 8.1 g/dL   6.5   Total Bilirubin 0.0 - 1.2 mg/dL   0.2   Alkaline Phos 38 - 126 U/L   124   AST 15 - 41 U/L    65   ALT 0 - 44 U/L   46     VAS US  CAROTID Result Date: 07/13/2024 Carotid Arterial Duplex Study Patient Name:  [name]  Date of Exam:   07/13/2024 Medical Rec #: [id number]    Accession #:    [id number] Date of Birth: 06-09-1961    Patient Gender: F Patient Age:   81 years Exam Location:  [facility] Procedure:      VAS US  CAROTID Referring Phys: WADDELL DONATH --------------------------------------------------------------------------------  Indications:       Bilateral bruits. Risk Factors:      Hypertension, hyperlipidemia, Diabetes, past history of                    smoking, prior MI, coronary artery disease, PAD. Other Factors:     Acute CHF, COPD on 2L home oxygen. History of right Ax-fem                    11/29/22, left fem-pop bypass graft, left femoral                    endarterectomy, and left iliac stenting 08/22/23. Limitations        Today's exam was limited due to the patient's respiratory                    variation. Comparison Study:  No prior study on file Performing Technologist: Alberta Lis RVS  Examination Guidelines: A complete evaluation includes B-mode imaging, spectral Doppler, color Doppler, and power Doppler as needed of all accessible portions of each vessel. Bilateral testing is considered an integral part of a complete examination. Limited examinations for reoccurring indications may be performed as noted.  Right Carotid Findings: +----------+--------+--------+--------+----------------------+--------+           PSV cm/sEDV cm/sStenosisPlaque Description    Comments +----------+--------+--------+--------+----------------------+--------+ CCA Prox  114     11              irregular and calcific         +----------+--------+--------+--------+----------------------+--------+ CCA Distal107     13              irregular and calcific         +----------+--------+--------+--------+----------------------+--------+ ICA Prox  248     51      40-59%   irregular and calcific         +----------+--------+--------+--------+----------------------+--------+ ICA  Mid   95      22                                             +----------+--------+--------+--------+----------------------+--------+ ICA Distal101     27                                             +----------+--------+--------+--------+----------------------+--------+ ECA       151     6               calcific                       +----------+--------+--------+--------+----------------------+--------+ +----------+--------+-------+---------+-------------------+           PSV cm/sEDV cmsDescribe Arm Pressure (mmHG) +----------+--------+-------+---------+-------------------+ [id number]            Turbulent                    +----------+--------+-------+---------+-------------------+ +---------+--------+--+--------+-+ VertebralPSV cm/s31EDV cm/s7 +---------+--------+--+--------+-+  Left Carotid Findings: +----------+--------+--------+--------+------------------+------------------+           PSV cm/sEDV cm/sStenosisPlaque DescriptionComments           +----------+--------+--------+--------+------------------+------------------+ CCA Prox  144     23                                intimal thickening +----------+--------+--------+--------+------------------+------------------+ CCA Distal115     29              heterogenous                         +----------+--------+--------+--------+------------------+------------------+ ICA Prox  163     31      1-39%   calcific                             +----------+--------+--------+--------+------------------+------------------+ ICA Mid   161     39                                                   +----------+--------+--------+--------+------------------+------------------+ ICA Distal117     29                                                    +----------+--------+--------+--------+------------------+------------------+ ECA       136     3                                                    +----------+--------+--------+--------+------------------+------------------+ +----------+--------+--------+--------+-------------------+           PSV cm/sEDV cm/sDescribeArm Pressure (mmHG) +----------+--------+--------+--------+-------------------+ [id number]             Stenotic                    +----------+--------+--------+--------+-------------------+ +---------+--------+--+--------+--+  VertebralPSV cm/s96EDV cm/s20 +---------+--------+--+--------+--+   Summary: Right Carotid: Velocities in the right ICA are consistent with a 40-59%                stenosis. Left Carotid: Velocities in the left ICA are consistent with a 1-39% stenosis. Vertebrals:  Bilateral vertebral arteries demonstrate antegrade flow. Subclavians: Left subclavian artery was stenotic. Right subclavian artery flow              was disturbed. *See table(s) above for measurements and observations.  Electronically signed by Gaile New MD on 07/13/2024 at 5:00:46 PM.    Final      ASSESSMENT/PLAN:  Assessment: Principal Problem:   COPD exacerbation (HCC) Active Problems:   Diabetes mellitus, type II, insulin  dependent (HCC)   Tobacco dependence   COPD (chronic obstructive pulmonary disease) (HCC)   Essential hypertension, benign   Ovarian cancer (HCC)   Pneumonia   Hyperglycemia   Asthma with acute exacerbation   Elevated brain natriuretic peptide (BNP) level   Generalized anxiety disorder   MILAYAH KRELL is a 64 y.o. female with a history of chronic hypoxic respiratory failure on 2LNC, suspected COPD (no documented PFTs), HFmrEF, angina, PAD s/p multiple revascularizations, T2DM, HTN, HLD, TUD, depression, anxiety, and panic attacks who presented with SOB and admitted for acute hypoxic and hypercarbic respiratory failure d/t panic attack + HFrEF  exacerbation, now on hospital day 2.  Plan: #Acute hypoxic and hypercarbic respiratory failure 2/2 anxiety/panic attack #COPD with chronic hypoxia without exacerbation #HFrEF exacerbation Patient resting comfortably on humidified 3L [place].  No subjective SOB at this time.  Initial presentation with acute SOB and AMS in the setting of chronic hypoxic respiratory failure.  She was initially found to be hypercarbic on VBG which improved with BiPAP.  Not a chronic CO2 retainer as she did not show signs of metabolic compensation.  Her symptoms all started with acute anxiety/feelings of panic which improved after benzodiazepine and Precedex  in the ED.  She has continued to receive hydroxyzine  as needed which has managed her acute anxiety and her oxygen requirements returned closer to baseline.  Initially wheezing on presentation but this is now resolved.  Lungs are clear to auscultation bilaterally.  Respiratory failure induced by panic attack but also in the setting of new-onset HFrEF.  proBNP initially elevated in the 800s.  Updated TTE showing EF of 35-40% with LMWA and grade 2 diastolic dysfunction.  Around ~1L output since yesterday. Continuing home inhalers, anxiety medications.  Diuresing per cardiology. - SpO2 goal of 88-92% - DuoNebs every 4 hours as needed - Continue Breztri  inhaler (switched from home Trelegy Ellipta  while admitted) - Scheduled albuterol  inhaler 4 times daily - Lasix  80 mg twice daily per cardiology - Reduce citalopram  to 20 mg daily (educated on risk of arrhythmias) - Strict I's/O's - Continuous cardiac monitoring - Trend BMPs while diuresing - Mucinex  600 mg twice daily - PT/OT eval  #NSTEMI #Angina No active chest pain on evaluation this morning. Systolic murmur on exam.  Initial EKG without concerning ST/T wave abnormalities. Troponin peaked at 423, now downtrending. Takes ASA and Plavix  given extensive PAD as well as Lipitor and Zetia . Will continue these home  medications.  Plan for [facility] and [facility] this morning, will await further cardiology recommendations.   - Cardiology consulted, appreciate recommendations  - Continue heparin , pharmacy to dose  - LHC + RHC planned for 1/19, n.p.o. at midnight  - Continue Imdur  30 mg daily  - Continue lisinopril  5 mg  daily - Continue home ASA 81 mg daily - Continue home Clopidogrel  75 mg daily - Continue home Lipitor 80 mg daily - Continue home Zetia  10 mg daily  #Severe hyperglycemia #T2DM Glucoses in the 80s this AM. Last A1c of 12.4% on 07/01/24. Currently home regimen of Semglee  36 units daily, 6 units Humalog 3 times daily before meals, Mounjaro  2.5 mg weekly but has not started yet. Intolerant to SGLT2i due to mycotic infections and metformin  due to GI distress. Hyperglycemia on admission likely severe hyperglycemia vs HHS. Rapid improvement with EndoTool and now on SQ insulin . Will continue resistant SSI and continue Semglee . - Resistant SSI - Reduce Semglee  to 20 units d/t NPO - Carb-modified diet after procedure - Trend CBGs  #Peripheral Arterial Disease s/p multiple revascularizations #Right Carotid Bruit Follows with Dr. Serene with vascular surgery. S/p right axillary to common femoral and left femoral to popliteal bypass grafts. Carotid US  showed 1-39% stenosis in left ICA and 40-59% in right ICA. No indications for CEA at this time. No other acute concerns regarding PAD. Will continue to monitor for active symptoms. 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 Viktoria.  CEA s/p procedures was elevated at 6.44 but still decreasing from prior value of 14.40.  She was recommended to follow-up with GI for further workup, but do not see any definitive documentation of a follow-up visit.  No acute concerns during this admission.  Will repeat CEA and follow up in [facility] for GI referral if necessary (follows with Dr. Abran). - CEA pending  #HTN BP better controlled with lisinopril . Now systolic BP 130s. Home meds are Lisinopril  5 mg and Carvedilol  6.25 mg bid. - Continue home Lisinopril  5 mg daily  #HLD Last LDL of 182 and HDL 33 on 07/01/2024.  Currently taking Lipitor 80 mg daily and Zetia  10 mg daily.  Per cardiology, patient is pending referral to lipid clinic to assess need for Repatha in the outpatient setting. - Continue home Lipitor and Zetia  as above - Follow-up outpatient in lipid clinic pending referral  #Depression On evaluation this morning patient had normal affect and mood. Not currently experiencing anxiety or feelings of panic. Continuing home medications but will decrease citaloram to 20 mg daily due to increased risk of prolonged QTC in patients older than 60. - Decreasing citalopram  to 20 mg daily - Continue Wellbutrin  SR 150 mg twice daily  #Tobacco Use Disorder Currently in remission.  Patient with 43-pack-year history, quit 1 year ago.  Now currently vapes nicotine  daily.  Currently on Wellbutrin  which will also help with cravings.  Counseled patient on cessation of vaping. Will offer nicotine  patch if she has cravings while admitted. - Continue Wellbutrin  SR 150 mg twice daily   Best Practice: Diet: NPO for Cath IVF: Fluids: None, Rate: None VTE: Heparin  Code: Full  Disposition planning: Therapy Recs: Pending, DME: none Family Contact: Eleanor Graff (Daughter), to be notified. DISPO: Anticipated discharge to Home pending clinical improvement and further workup.  Signature:  Letha Cheadle, MD [facility]  PGY-1 07/14/2024, 6:16 AM  On Call pager [phone]  "

## 2024-07-14 NOTE — Interval H&P Note (Signed)
 History and Physical Interval Note:  07/14/2024 7:23 AM  Vanessa [name]  has presented today for surgery, with the diagnosis of cp.  The various methods of treatment have been discussed with the patient and family. After consideration of risks, benefits and other options for treatment, the patient has consented to  Procedures: RIGHT/LEFT HEART CATH AND CORONARY ANGIOGRAPHY (N/A) as a surgical intervention.  The patient's history has been reviewed, patient examined, no change in status, stable for surgery.  I have reviewed the patient's chart and labs.  Questions were answered to the patient's satisfaction.   Cath Lab Visit (complete for each Cath Lab visit)  Clinical Evaluation Leading to the Procedure:   ACS: Yes.    Non-ACS:    Anginal Classification: CCS IV  Anti-ischemic medical therapy: Maximal Therapy (2 or more classes of medications)  Non-Invasive Test Results: No non-invasive testing performed  Prior CABG: No previous CABG        [facility] 07/14/2024 7:23 AM

## 2024-07-14 NOTE — Evaluation (Signed)
 Occupational Therapy Evaluation Patient Details Name: Vanessa [name] MRN: [id number] DOB: [birth year] Today's Date: 07/14/2024   History of Present Illness   64 y.o. female presenting 1/16 with SOB. Work up revealed hyperglycemia >500, mall bilateral pleural effusions, and elevated troponin. Admitted for management of COPD exacerbation, NSTEMI, and acute heart failure. S/p L/R heart cath and coronary angiography 1/19. PMH includes: COPD on 2L, asthma, poorly controlled DM II, HTN, HLD, severe PAD s/p revascularizations, and depression/anxiety/panic attacks.     Clinical Impressions PTA, pt living at home; son recently staying with her, but he works. Pt reports being independent within her home. Upon eval, pt presents with decreased activity tolerance and balance as compared to baseline, but overall mobilizing and performing BADL at supervision level. Pt on 4 L supplementary O2 during session as compared to her reported 2 L baseline. Pt able to perform figure 4 position for LB ADL and encouraged this due to pt decr activity tolerance. Will follow acutely, but do not suspect need for follow up OT after discharge.      If plan is discharge home, recommend the following:   A little help with walking and/or transfers;A little help with bathing/dressing/bathroom;Assistance with cooking/housework;Assist for transportation;Help with stairs or ramp for entrance     Functional Status Assessment   Patient has had a recent decline in their functional status and demonstrates the ability to make significant improvements in function in a reasonable and predictable amount of time.     Equipment Recommendations   None recommended by OT     Recommendations for Other Services         Precautions/Restrictions   Precautions Precautions: Fall Recall of Precautions/Restrictions: Intact Precaution/Restrictions Comments: watch O2 Restrictions Weight Bearing Restrictions Per Provider Order: Yes LUE  Weight Bearing Per Provider Order: Non weight bearing (s/p CATH lab 1/19)     Mobility Bed Mobility Overal bed mobility: Independent                  Transfers Overall transfer level: Needs assistance Equipment used: None Transfers: Sit to/from Stand Sit to Stand: Supervision           General transfer comment: for safety      Balance Overall balance assessment: Needs assistance Sitting-balance support: No upper extremity supported, Feet supported Sitting balance-Leahy Scale: Normal     Standing balance support: No upper extremity supported, During functional activity Standing balance-Leahy Scale: Fair Standing balance comment: poor tolerance for dynamic challenge, but stable without UE support during static stance and unchallenged gait                           ADL either performed or assessed with clinical judgement   ADL Overall ADL's : Needs assistance/impaired Eating/Feeding: Independent   Grooming: Supervision/safety;Standing   Upper Body Bathing: Set up;Sitting   Lower Body Bathing: Supervison/ safety;Sit to/from stand   Upper Body Dressing : Set up;Sitting   Lower Body Dressing: Supervision/safety;Sit to/from stand   Toilet Transfer: Supervision/safety           Functional mobility during ADLs: Supervision/safety       Vision Patient Visual Report: No change from baseline       Perception Perception: Within Functional Limits       Praxis Praxis: WFL       Pertinent Vitals/Pain Pain Assessment Pain Assessment: No/denies pain     Extremity/Trunk Assessment Upper Extremity Assessment Upper Extremity Assessment: Overall WFL for tasks  assessed   Lower Extremity Assessment Lower Extremity Assessment: Defer to PT evaluation   Cervical / Trunk Assessment Cervical / Trunk Assessment: Normal   Communication Communication Communication: No apparent difficulties   Cognition Arousal: Alert Behavior During Therapy: WFL  for tasks assessed/performed Cognition: No apparent impairments                               Following commands: Intact       Cueing  General Comments   Cueing Techniques: Verbal cues  SpO2 >93% thhroughout session on 4 L supplemental O2   Exercises     Shoulder Instructions      Home Living Family/patient expects to be discharged to:: Private residence Living Arrangements: Children Available Help at Discharge: Family (son living with her) Type of Home: Mobile home Home Access: Stairs to enter Secretary/administrator of Steps: 3 Entrance Stairs-Rails: None Home Layout: One level     Bathroom Shower/Tub: Producer, Television/film/video: Standard     Home Equipment: Agricultural Consultant (2 wheels);Shower seat;BSC/3in1;Wheelchair - manual;Grab bars - tub/shower;Grab bars - toilet;Hand held shower head;Transport chair;Cane - single point;Crutches;Rollator (4 wheels)          Prior Functioning/Environment Prior Level of Function : Independent/Modified Independent             Mobility Comments: no DME for short distances, 2L O2, one fall coming up stairs to enter house ADLs Comments: Does use the shower chair when bathing.    OT Problem List: Decreased strength;Decreased activity tolerance;Impaired balance (sitting and/or standing);Cardiopulmonary status limiting activity   OT Treatment/Interventions: Self-care/ADL training;Therapeutic exercise;DME and/or AE instruction;Therapeutic activities;Patient/family education;Balance training      OT Goals(Current goals can be found in the care plan section)   Acute Rehab OT Goals Patient Stated Goal: get better and go home OT Goal Formulation: With patient Time For Goal Achievement: 07/28/24 Potential to Achieve Goals: Good   OT Frequency:  Min 2X/week    Co-evaluation              AM-PAC OT 6 Clicks Daily Activity     Outcome Measure Help from another person eating meals?: None Help from  another person taking care of personal grooming?: A Little Help from another person toileting, which includes using toliet, bedpan, or urinal?: A Little Help from another person bathing (including washing, rinsing, drying)?: A Little Help from another person to put on and taking off regular upper body clothing?: A Little Help from another person to put on and taking off regular lower body clothing?: A Little 6 Click Score: 19   End of Session Nurse Communication: Mobility status  Activity Tolerance: Patient tolerated treatment well Patient left: in bed;with call bell/phone within reach  OT Visit Diagnosis: Unsteadiness on feet (R26.81);Muscle weakness (generalized) (M62.81);Other (comment) (decr activity tolerance)                Time: 8540-8486 OT Time Calculation (min): 14 min Charges:  OT General Charges $OT Visit: 1 Visit OT Evaluation $OT Eval Low Complexity: 1 Low  [name] FREDERICK, OTR/L [facility] Acute Rehabilitation Office: [phone]   [name] 07/14/2024, 3:20 PM

## 2024-07-14 NOTE — Plan of Care (Signed)
   Problem: Education: Goal: Ability to describe self-care measures that may prevent or decrease complications (Diabetes Survival Skills Education) will improve Outcome: Progressing

## 2024-07-14 NOTE — Progress Notes (Signed)
 PHARMACY - ANTICOAGULATION CONSULT NOTE  Pharmacy Consult for heparin  Indication: chest pain/ACS  Labs: Recent Labs    07/11/24 1755 07/11/24 1819 07/11/24 1934 07/11/24 2203 07/12/24 0206 07/12/24 9787 07/12/24 1817 07/13/24 0634 07/13/24 0925 07/13/24 1658 07/13/24 2326  HGB 13.9   < > 13.6  --   --  12.0  --  11.5*  --   --   --   HCT 44.1   < > 40.0  --   --  37.4  --  36.6  --   --   --   PLT 492*  --   --   --   --  305  --  340  --   --   --   HEPARINUNFRC  --   --   --   --   --   --    < >  --  0.16* 0.38 0.33  CREATININE 0.80   < >  --  0.80 0.77  --   --  0.68  --   --   --    < > = values in this interval not displayed.  Assessment/Plan:  64yo female remains therapeutic on heparin . Will continue infusion at current rate of 1500 units/hr and monitor daily level.  Marvetta Dauphin, PharmD, BCPS 07/14/2024 12:53 AM

## 2024-07-15 ENCOUNTER — Other Ambulatory Visit: Payer: Self-pay

## 2024-07-15 ENCOUNTER — Encounter (HOSPITAL_COMMUNITY): Payer: Self-pay | Admitting: Cardiology

## 2024-07-15 DIAGNOSIS — I110 Hypertensive heart disease with heart failure: Secondary | ICD-10-CM | POA: Diagnosis not present

## 2024-07-15 DIAGNOSIS — J9601 Acute respiratory failure with hypoxia: Secondary | ICD-10-CM | POA: Diagnosis not present

## 2024-07-15 DIAGNOSIS — I5020 Unspecified systolic (congestive) heart failure: Secondary | ICD-10-CM | POA: Diagnosis not present

## 2024-07-15 DIAGNOSIS — J9602 Acute respiratory failure with hypercapnia: Secondary | ICD-10-CM | POA: Diagnosis not present

## 2024-07-15 DIAGNOSIS — I214 Non-ST elevation (NSTEMI) myocardial infarction: Secondary | ICD-10-CM

## 2024-07-15 LAB — BASIC METABOLIC PANEL WITH GFR
Anion gap: 8 (ref 5–15)
BUN: 25 mg/dL — ABNORMAL HIGH (ref 8–23)
CO2: 33 mmol/L — ABNORMAL HIGH (ref 22–32)
Calcium: 9.1 mg/dL (ref 8.9–10.3)
Chloride: 100 mmol/L (ref 98–111)
Creatinine, Ser: 0.78 mg/dL (ref 0.44–1.00)
GFR, Estimated: >60 mL/min
Glucose, Bld: 125 mg/dL — ABNORMAL HIGH (ref 70–99)
Potassium: 4.1 mmol/L (ref 3.5–5.1)
Sodium: 141 mmol/L (ref 135–145)

## 2024-07-15 LAB — CBC
HCT: 38.1 % (ref 36.0–46.0)
Hemoglobin: 11.9 g/dL — ABNORMAL LOW (ref 12.0–15.0)
MCH: 29.2 pg (ref 26.0–34.0)
MCHC: 31.2 g/dL (ref 30.0–36.0)
MCV: 93.4 fL (ref 80.0–100.0)
Platelets: 345 K/uL (ref 150–400)
RBC: 4.08 MIL/uL (ref 3.87–5.11)
RDW: 13.7 % (ref 11.5–15.5)
WBC: 10.5 K/uL (ref 4.0–10.5)
nRBC: 0 % (ref 0.0–0.2)

## 2024-07-15 LAB — GLUCOSE, CAPILLARY
Glucose-Capillary: 130 mg/dL — ABNORMAL HIGH (ref 70–99)
Glucose-Capillary: 177 mg/dL — ABNORMAL HIGH (ref 70–99)
Glucose-Capillary: 194 mg/dL — ABNORMAL HIGH (ref 70–99)
Glucose-Capillary: 201 mg/dL — ABNORMAL HIGH (ref 70–99)
Glucose-Capillary: 283 mg/dL — ABNORMAL HIGH (ref 70–99)
Glucose-Capillary: 86 mg/dL (ref 70–99)

## 2024-07-15 MED ORDER — INSULIN ASPART 100 UNIT/ML IJ SOLN
0.0000 [IU] | Freq: Three times a day (TID) | INTRAMUSCULAR | Status: DC
  Administered 2024-07-15: 5 [IU] via SUBCUTANEOUS
  Filled 2024-07-15: qty 5

## 2024-07-15 MED ORDER — INSULIN GLARGINE-YFGN 100 UNIT/ML ~~LOC~~ SOLN
26.0000 [IU] | Freq: Every day | SUBCUTANEOUS | Status: DC
  Administered 2024-07-15: 26 [IU] via SUBCUTANEOUS
  Filled 2024-07-15 (×2): qty 0.26

## 2024-07-15 MED ORDER — SPIRONOLACTONE 12.5 MG HALF TABLET
12.5000 mg | ORAL_TABLET | Freq: Every day | ORAL | Status: DC
  Administered 2024-07-15 – 2024-07-17 (×3): 12.5 mg via ORAL
  Filled 2024-07-15 (×3): qty 1

## 2024-07-15 MED ORDER — INSULIN ASPART 100 UNIT/ML IJ SOLN
0.0000 [IU] | Freq: Every day | INTRAMUSCULAR | Status: DC
  Administered 2024-07-15: 3 [IU] via SUBCUTANEOUS
  Filled 2024-07-15: qty 3

## 2024-07-15 NOTE — Progress Notes (Signed)
 Heart Failure Nurse Navigator Progress Note  PCP: Jolaine Pac, DO PCP-Cardiologist: Peter Jordan, MD Admission Diagnosis: Respiratory distress COPD exacerbation (HCC) Acute on chronic congestive heart failure, unspecified heart failure type (HCC) Acute on chronic respiratory failure with hypercapnia [facility]) Admitted from: Home BIB [facility]  Presentation:   Vanessa [name] is a 64 y.o. female.  She presented in respiratory distress.  She has a history of COPD on home oxygen, diabetes, hypertension, hyperlipidemia, and PAD.  Daughter had reported at the bedside she seemed more confused.  Pro-BNP-854. HS-Troponin 91. Chest x-ray: Diffuse interstitial prominence, which may reflect pulmonary edema or atypical/viral infection.  Small bilateral pleural effusions.  Left lung base atelectasis.  ECHO/ LVEF: 35-40% Grade II Diastolic Dysfunction, (pseudonormalization)  Clinical Course:  Past Medical History:  Diagnosis Date   Asthma    No PFT performed   Blood transfusion without reported diagnosis    has to donate blood due to having thick blood.   Complication of anesthesia    Woken up afterwards with panic attacks   COPD (chronic obstructive pulmonary disease) (HCC)    Depression    Diabetes mellitus 2002   Dyspnea    with exertion   GERD (gastroesophageal reflux disease)    Helicobacter pylori (H. pylori) infection    s/p triple therapy   Hepatic hemangioma    History of heart murmur in childhood    History of kidney stones    Hyperlipidemia    Hypertension    Hypertriglyceridemia    Panic attacks    mostly Agaraphobia   Peripheral arterial disease    Peripheral vascular disease    Pneumonia    Ventral hernia      Social History   Socioeconomic History   Marital status: Married    Spouse name: Not on file   Number of children: Not on file   Years of education: Not on file   Highest education level: Not on file  Occupational History   Not on file  Tobacco Use   Smoking  status: Former    Current packs/day: 1.00    Average packs/day: 1 pack/day for 43.0 years (43.0 ttl pk-yrs)    Types: Cigarettes   Smokeless tobacco: Never   Tobacco comments:    Wants Wellbutrin      Vapes currently  Vaping Use   Vaping status: Every Day   Substances: Nicotine   Substance and Sexual Activity   Alcohol use: No    Alcohol/week: 0.0 standard drinks of alcohol    Comment: none   Drug use: No   Sexual activity: Not Currently  Other Topics Concern   Not on file  Social History Narrative   Drinks at least a pot of coffee every day.    Social Drivers of Health   Tobacco Use: Medium Risk (07/12/2024)   Patient History    Smoking Tobacco Use: Former    Smokeless Tobacco Use: Never    Passive Exposure: Not on Actuary Strain: Not on file  Food Insecurity: No Food Insecurity (07/12/2024)   Epic    Worried About Programme Researcher, Broadcasting/film/video in the Last Year: Never true    Ran Out of Food in the Last Year: Never true  Transportation Needs: No Transportation Needs (07/12/2024)   Epic    Lack of Transportation (Medical): No    Lack of Transportation (Non-Medical): No  Physical Activity: Not on file  Stress: Stress Concern Present (06/02/2024)   [employer] of Occupational Health - Occupational Stress  Questionnaire    Feeling of Stress: Very much  Social Connections: Socially Isolated (07/12/2024)   Social Connection and Isolation Panel    Frequency of Communication with Friends and Family: More than three times a week    Frequency of Social Gatherings with Friends and Family: Once a week    Attends Religious Services: Never    Database Administrator or Organizations: No    Attends Banker Meetings: Never    Marital Status: Widowed  Depression (PHQ2-9): Medium Risk (07/03/2024)   Depression (PHQ2-9)    PHQ-2 Score: 6  Alcohol Screen: Low Risk (06/02/2024)   Alcohol Screen    Last Alcohol Screening Score (AUDIT): 0  Housing: Low Risk (07/12/2024)    Epic    Unable to Pay for Housing in the Last Year: No    Number of Times Moved in the Last Year: 0    Homeless in the Last Year: No  Utilities: Not At Risk (07/12/2024)   Epic    Threatened with loss of utilities: No  Health Literacy: Adequate Health Literacy (06/02/2024)   B1300 Health Literacy    Frequency of need for help with medical instructions: Never   Education Assessment and Provision:  Detailed education and instructions provided on heart failure disease management including the following:  Signs and symptoms of Heart Failure When to call the physician Importance of daily weights Low sodium diet Fluid restriction Medication management Anticipated future follow-up appointments  Patient education given on each of the above topics.  Patient acknowledges understanding via teach back method and acceptance of all instructions.  Education Materials:  Living Better With Heart Failure Booklet, HF zone tool, & Daily Weight Tracker Tool.  Patient has scale at home: Yes Patient has pill box at home: Yes    High Risk Criteria for Readmission and/or Poor Patient Outcomes: Heart failure hospital admissions (last 6 months): 1  No Show rate: 30% Difficult social situation: None determined at this time. Demonstrates medication adherence: Yes Primary Language: English Literacy level: Reading, Writing & Comprehension  Barriers of Care:   Daily Weights Diet & Fluid Restrictions  Considerations/Referrals:  Referral made to Heart Failure Pharmacist Stewardship: Yes Referral made to Heart Failure CSW/NCM TOC: No Referral made to Heart & Vascular TOC clinic: Yes. New TOC [place], 07/22/24 @ 3:30 PM.  Items for Follow-up on DC/TOC: Daily Weights Diet & Fluid Restrictions  Continued Heart Failure Education  Vanessa Pines, RN, BSN [facility] Heart Failure Navigator Secure Chat Only

## 2024-07-15 NOTE — Progress Notes (Signed)
 Team notified of pt vaping in room. Christy notified and pt.having daughter come to get vape and take home. Pt.aware that if daughter does not come by shift change security will need to take vape and lock it up.  Vape placed in pt's chart in bag until security is able to obtain.

## 2024-07-15 NOTE — Progress Notes (Signed)
" ° °  Heart Failure Stewardship Pharmacist Progress Note   PCP: Jolaine Pac, DO PCP-Cardiologist: Peter Jordan, MD    HPI:  64 year old female with a PMH significant for COPD on 2 L oxygen PTA, asthma, T2DM, HTN, HLD, severe PAD s/p revascuarization, depression/anxiety, and tobacco abuse (former smoker -- 43 pack-years but currently vapes daily) who presented on 07/11/24 with shortness of breath. Her family stated that she has had intermittent chest pain over the past few weeks and had followed with cardiology who planned to do a cardiac cath.  CXR showed diffuse interstitial changes with small bilateral pleural effision and left lower lobe atlectasis without focal consolidation. ECHO revealed an EF of 35-40%. She underwet a R/LHC on 1/18 which showed 3 vessel obstructive CAD, high LV filling pressures -- PCWP mean 30, LVEDP 40, mean PAP  30, and CO/CI 4.61/2.66. She was deemed a poor candidate for a CABG given her multiple comorbidities.  Patient is doing well today. She denies SOB, chest pain or issues lying flat. She has minimal lower extremity edema present on physical exam, and has been urinating a lot. Spoke with patient about her current medications for heart failure. I educated her on signs of heart failure, and that eventually our goal would be to transition her lisinopril  to Entresto or an ARB if her BP can tolerate it. Spironolactone  was started this morning, and the patient denies feeling lightheadedness or dizziness. She did state that she had a headache that is relieved by Tylenol . I reached out to her nurse to give her a dose. 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   Current HF Medications: Diuretic: IV furosemide  80 mg BID Beta Blocker: metoprolol  succinate 25 mg daily MRA: spironolactone  12.5 mg daily  Prior to admission HF Medications: Beta blocker: carvedilol  6.25 mg BID ACE/ARB/ARNI: lisinopril  5 mg  daily  Pertinent Lab Values: Serum creatinine 0.78, BUN 25, Potassium 4.1, Sodium 141, proBNP 854, Magnesium  2.2, A1c 12.4%  Vital Signs: Weight: 159 lbs (admission weight: 158 lbs) Blood pressure: 80s-130/50s-70s  Heart rate: 60s-80s  I/O: net -1.7 L yesterday; net -3.5 L since admission  Medication Assistance / Insurance Benefits Check: Does the patient have prescription insurance?  Yes Type of insurance plan: Medicare and Medicaid  Outpatient Pharmacy:  Prior to admission outpatient pharmacy: [facility] Is the patient willing to use [facility] TOC pharmacy at discharge? Yes Is the patient willing to transition their outpatient pharmacy to utilize a [facility] outpatient pharmacy?   Yes    Assessment: 1. Acute on chronic systolic CHF (LVEF 35-40%), due to ichemic etiology. NYHA class II symptoms. - Minimal lower extremity edema present on physical exam > continue IV furosemide  80 mg BID - BP soft > last dose of PTA lisinopril  on 1/18; monitor BP to switch to ARB or ARNI - A1c 12.4% + history of yeast infection on Jardiance  > would hold off on SGLT2i   Plan: 1) Medication changes recommended at this time: - none  2)  Education  - To be completed prior to discharge  B. Vanessa [name], PharmD PGY-1 Pharmacy Resident [facility] 07/15/2024 8:45 AM    "

## 2024-07-15 NOTE — Inpatient Diabetes Management (Signed)
 Inpatient Diabetes Program Recommendations  AACE/ADA: New Consensus Statement on Inpatient Glycemic Control (2015)  Target Ranges:  Prepandial:   less than 140 mg/dL      Peak postprandial:   less than 180 mg/dL (1-2 hours)      Critically ill patients:  140 - 180 mg/dL   Lab Results  Component Value Date   GLUCAP 86 07/15/2024   HGBA1C 12.4 (H) 07/01/2024    Review of Glycemic Control  Latest Reference Range & Units 07/15/24 04:52 07/15/24 07:56 07/15/24 11:49  Glucose-Capillary 70 - 99 mg/dL 869 (H) 822 (H) 86   Diabetes history: DM 2 Outpatient Diabetes medications:  Dexcom G7 Basaglar  36 units daily Humalog 6 units tid with meals  Current orders for Inpatient glycemic control:  Novolog  0-20 units q 4 hours Semglee  26 units daily  Inpatient Diabetes Program Recommendations:    Note patient now on CHO modified diet.  Consider reducing Novolog  correction to moderate tid with meals and HS (instead of Resistant q 4 hours).   Thanks,  Randall Bullocks, RN, BC-ADM Inpatient Diabetes Coordinator Pager [phone]  (8a-5p)

## 2024-07-15 NOTE — Progress Notes (Signed)
 "  Progress Note  Patient Name: Vanessa [name] Date of Encounter: 07/15/2024 [facility] Cardiologist: Peter Jordan, MD   Interval Summary   Doing well today. No complaints or concerns at this time. Continues to have baseline dyspnea (on 2L/min O2 via [place] at home) but states that she was able to ambulate to and from the bathroom without oxygen, without difficulties or symptoms. Denies any chest pain or lower extremity swelling. Cath site tender but without complications  Vital Signs Vitals:   07/14/24 2000 07/15/24 0002 07/15/24 0454 07/15/24 0756  BP: (!) 98/51 95/62 97/60  (!) 99/54  Pulse: 70   60  Resp: 17 18 18 17   Temp: 98.5 F (36.9 C) 98.6 F (37 C) 98.7 F (37.1 C) 98.9 F (37.2 C)  TempSrc: Oral Oral Oral Oral  SpO2: 93%   100%  Weight:      Height:        Intake/Output Summary (Last 24 hours) at 07/15/2024 0821 Last data filed at 07/14/2024 1959 Gross per 24 hour  Intake 590 ml  Output 2000 ml  Net -1410 ml      07/14/2024    3:53 AM 07/13/2024    4:17 AM 07/12/2024   10:32 AM  Last 3 Weights  Weight (lbs) 159 lb 1.6 oz 164 lb 158 lb 4.6 oz  Weight (kg) 72.167 kg 74.39 kg 71.8 kg      Telemetry/ECG  Sinus rhythm, rate 60s - Personally Reviewed  Physical Exam  GEN: Resting comfortably in bed, on 3.5 L/min O2 via , in no acute distress.   Neck: No JVD Cardiac: RRR, no murmurs, rubs, or gallops.  Respiratory: Mild crackles at lung bases bilaterally GI: Soft, nontender MS: No peripheral edema  Assessment & Plan  NSTEMI, type 1 Multivessel CAD Hyperlipidemia Presented with sudden onset SOB, denied any angina but had typical angina (burning chest pain with exertion and eating) that started in September 2025 that gradually worsened. Frequency 3x/week. Was originally scheduled for outpatient LHC on Monday, 07/14/24 Hs-troponins elevated, peaked at 423 proBNP elevated 854 EKG showed NSR, no Q waves in anterior leads Echo showed LVEF 35-40% with severe  HK of entire lateral wall, new change compared to prior echo [facility] showed 3-vessel obstructive CAD/diffuse diabetic disease with high LV filling pressures (PCWP 26/27, mean 30 mmHg, LVEDP 40 mmHg), moderate pulmonary HTN (PAP 47/26, mean 35 mmHg) Poor CABG candidate due to multiple comorbidities, angina likely to improve with improvement in LV pressures. Will optimize CHF therapy and manage CAD medically Continue ASA and Plavix  x 12 months, followed by Plavix  monotherapy Continue atorvastatin  80 mg daily, Zetia  Continue metoprolol  succinate 25 mg daily Discontinue Imdur  30 mg daily due to hypotension   Acute systolic and diastolic heart failure Presentation as above Echo showed LVEF 35-40%, RWMA (severe HK of entire lateral wall), G2DD, normal RV systolic function, dilated IVC RHC showed elevated filling pressures (PCWP 26/27, mean 30 mmHg, LVEDP 40 mmHg. PAP 47/26, mean 35 mmHg. RA pressure 17/16, mean 14 mmHg.) Net I/O -3.5 L since admission, with -2 L in the past 24 hours Weight 74.4 kg >> 72.2 kg Stable creatinine Will defer SGLT2i for now given uncontrolled diabetes. Known intolerance to Jardiance  due to history of severe yeast infection Discontinue lisinopril  in favor for Entresto, will need 36 hour washout period before starting Entresto 1/21 Continue metoprolol  succinate 12.5 mg daily Start spironolactone  25 mg daily Given markedly elevated filling pressures, will continue aggressive diuresis with IV Lasix  80  mg BID Continue strict I/O, daily BMPs, daily weights   Hypertension Lisinopril  was discontinued for washout period prior to starting Entresto 1/21 Soft BP since starting metoprolol  succinate 25 mg daily, discontinued Imdur , will CTM   PAD s/p multiple revascularizations Follows with vascular surgery (Dr. Serene) S/p right axillary to common femoral and left femoral to popliteal bypass grafts Carotid US  showed 1-39% stenosis in left ICA and 40-59% in right ICA, no  indications for CEA at this time Continue home ASA and Plavix  Ongoing management per primary   Per primary Chronic hypoxic respiratory failure Type 2 diabetes mellitus COPD Depression/anxiety/panic attacks  For questions or updates, please contact [facility] Please consult [email or web address] for contact info under         Signed, Owen MARLA Daniels, PA-C   "

## 2024-07-15 NOTE — TOC Initial Note (Signed)
 Transition of Care [facility]) - Initial/Assessment Note    Patient Details  Name: Vanessa [name] MRN: [id number] Date of Birth: [birth year]  Transition of Care [facility]) CM/SW Contact:    Sudie Erminio Deems, RN Phone Number: 07/15/2024, 2:44 PM  Clinical Narrative: Patient presented for acute shortness of breath- COPD exacerbation. PTA patient states she was from home and her youngest son is in the home. ICM discussed home health with the patient and the patient declined the home health services-MD is aware. Patient has DME two rolling walkers, wheelchairs, and transport chair in the home. Patient has oxygen via [employer] and is asking for a smaller tank. Order placed in for Evaluation of POC- [facility] will assess the patient once she is discharged home. ICM will continue to follow for additional needs.   Expected Discharge Plan: Home/Self Care   Patient Goals and CMS Choice Patient states their goals for this hospitalization and ongoing recovery are:: plan to return home with family support.  Expected Discharge Plan and Services   Discharge Planning Services: CM Consult Post Acute Care Choice: NA Living arrangements for the past 2 months: Single Family Home                   DME Agency: NA  HH Arranged: Patient Refused HH   Prior Living Arrangements/Services Living arrangements for the past 2 months: Single Family Home Lives with:: Self Patient language and need for interpreter reviewed:: Yes Do you feel safe going back to the place where you live?: Yes      Need for Family Participation in Patient Care: No (Comment) Care giver support system in place?: No (comment) Current home services: DME (2 rolling walkers, wheelchair, transport chair.) Criminal Activity/Legal Involvement Pertinent to Current Situation/Hospitalization: No - Comment as needed  Activities of Daily Living   ADL Screening (condition at time of admission) Independently performs ADLs?: Yes (appropriate for  developmental age) Is the patient deaf or have difficulty hearing?: No Does the patient have difficulty seeing, even when wearing glasses/contacts?: Yes Does the patient have difficulty concentrating, remembering, or making decisions?: No  Permission Sought/Granted Permission sought to share information with : Family Supports, Case Manager   Emotional Assessment Appearance:: Appears stated age Attitude/Demeanor/Rapport: Engaged Affect (typically observed): Appropriate Orientation: : Oriented to Self, Oriented to Place, Oriented to Situation, Oriented to  Time Alcohol / Substance Use: Not Applicable Psych Involvement: No (comment)  Admission diagnosis:  Respiratory distress [R06.03] COPD exacerbation (HCC) [J44.1] Acute on chronic respiratory failure with hypercapnia (HCC) [J96.22] Acute on chronic congestive heart failure, unspecified heart failure type (HCC) [I50.9] Patient Active Problem List   Diagnosis Date Noted   Non-ST elevation (NSTEMI) myocardial infarction (HCC) 07/15/2024   Coronary artery disease involving native coronary artery of native heart with unstable angina pectoris (HCC) 07/14/2024   Acute systolic heart failure (HCC) 07/14/2024   Hyperglycemia 07/11/2024   Asthma with acute exacerbation 07/11/2024   COPD exacerbation (HCC) 07/11/2024   Elevated brain natriuretic peptide (BNP) level 07/11/2024   Generalized anxiety disorder 07/11/2024   Atypical angina 06/04/2024   Insurance coverage problems 12/13/2023   Critical lower limb ischemia (HCC) 08/22/2023   Heart failure with mildly reduced ejection fraction (HFmrEF) (HCC) 12/17/2022   Gangrene of toe of right foot (HCC) 11/29/2022   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) 11/22/2022   Pneumonia 08/05/2022   Peripheral arterial disease with history of revascularization 05/31/2022   Genetic testing 01/02/2022   Ovarian cancer (HCC) 12/02/2021  Seasonal allergies 09/23/2021   Pelvic mass 05/12/2020    Elevated CEA 05/12/2020   Polycythemia, 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   Umbilical hernia without obstruction and without gangrene 08/09/2017   Essential hypertension, benign 03/11/2014   Hepatic steatosis 05/30/2013   Irritable bowel syndrome (IBS) 01/09/2013   Healthcare maintenance 01/09/2013   Baker's cyst of knee 03/20/2011   COPD (chronic obstructive pulmonary disease) (HCC) 12/01/2010   Anxiety and depression 07/23/2008   Tobacco dependence 10/23/2007   HLD (hyperlipidemia) 06/14/2006   Asthma 06/14/2006   GERD 06/14/2006   Diabetes mellitus, type II, insulin  dependent (HCC) 06/26/2002   PCP:  Jolaine Pac, DO Pharmacy:   [facility] [address], [address] [place] [address] Phone: [phone] Fax: [phone]  [facility] [address] [place] [address] Phone: [phone] Fax: [phone]  Social Drivers of Health (SDOH) Social History: SDOH Screenings   Food Insecurity: No Food Insecurity (07/12/2024)  Housing: Low Risk (07/12/2024)  Transportation Needs: No Transportation Needs (07/12/2024)  Utilities: Not At Risk (07/12/2024)  Alcohol Screen: Low Risk (06/02/2024)  Depression (PHQ2-9): Medium Risk (07/03/2024)  Social Connections: Socially Isolated (07/12/2024)  Stress: Stress Concern Present (06/02/2024)  Tobacco Use: Medium Risk (07/12/2024)  Health Literacy: Adequate Health Literacy (06/02/2024)   Readmission Risk Interventions    08/28/2023   10:08 AM 12/06/2022   10:13 AM 11/23/2022   10:35 AM  Readmission Risk Prevention Plan  Post Dischage Appt Complete    Medication Screening Complete    Transportation Screening Complete Complete Complete  PCP or Specialist Appt within 5-7 Days   Complete  Home Care Screening   Complete  Medication Review (RN CM)   Complete  HRI or Home Care Consult  Complete   Social Work Consult for Recovery Care Planning/Counseling  Complete    Palliative Care Screening  Not Applicable   Medication Review Oceanographer)  Complete

## 2024-07-15 NOTE — Progress Notes (Signed)
 PT Cancellation Note  Patient Details Name: Vanessa [name] MRN: [id number] DOB: 11-21-1960   Cancelled Treatment:    Reason Eval/Treat Not Completed: Pain limiting ability to participate (headache and anxiety). RN giving meds for both. Will check back at later time if schedule allows.   Vanessa [name], PT  Acute Rehab Services Secure chat preferred Office [phone]    Vanessa [name] 07/15/2024, 11:58 AM

## 2024-07-15 NOTE — Progress Notes (Addendum)
 "  HD#3 SUBJECTIVE:  Patient Summary: Vanessa [name] is a 64 y.o. female with a pertinent PMH of chronic hypoxic respiratory failure on 2LNC, suspected COPD (no documented PFTs), HFmrEF, angina, PAD s/p multiple revascularizations, T2DM, HTN, HLD, TUD, depression, anxiety, and panic attacks who presented with SOB and admitted for acute hypoxic and hypercarbic respiratory failure d/t panic attack + HFrEF exacerbation.   Overnight Events: Soft BPs overnight, overnight team not notified. No acute events.  Interim History: Patient was wearing a green blanket with pink disco balls upon evaluation this morning. Resting comfortably in bed. She notes a frontal headache coming from her eyes that has not changed much since her admission. She thinks it's likely from her cataracts which she was supposed to get fixed. Patient again has not had any chest pain during this hospitalization. Denies any shortness of breath while she is on the nasal cannula. Yesterday she took it off for a time while in her room and did not have any troubles with that. Was able to ambulate a bit and did not have any shortness of breath. Does not notice any leg swelling. I expressed concern about her low blood pressures overnight and she denied any feelings of dizziness or lightheadedness. Her procedure went well yesterday, she just has some soreness in her arms. Denies any orthopnea. No more bleeding symptoms.  OBJECTIVE:  Vital Signs: Vitals:   07/14/24 1958 07/14/24 2000 07/15/24 0002 07/15/24 0454  BP: (!) 96/52 (!) 98/51 95/62 97/60   Pulse: 70 70    Resp:  17 18 18   Temp:  98.5 F (36.9 C) 98.6 F (37 C) 98.7 F (37.1 C)  TempSrc:  Oral Oral Oral  SpO2: 93% 93%    Weight:      Height:        Filed Weights   07/12/24 1032 07/13/24 0417 07/14/24 0353  Weight: 71.8 kg 74.4 kg 72.2 kg     Intake/Output Summary (Last 24 hours) at 07/15/2024 0610 Last data filed at 07/14/2024 1959 Gross per 24 hour  Intake 590 ml  Output  2700 ml  Net -2110 ml   Net IO Since Admission: -3,506.31 mL [07/15/24 0610]  Physical Exam: Constitutional: Chronically ill-appearing female in no acute distress HENT: normocephalic atraumatic, mucous membranes moist, scattered xanthelasmas around eyes Eyes: conjunctiva non-erythematous, PERRL, no scleral icterus Cardiovascular: regular rate and rhythm, no m/r/g; JVP approximately 7-8 cm   Pulmonary/Chest: Normal work of breathing on humidified HFNC 4L (96% sat), diffuse inspiratory crackles in dependent posterior lung fields Abdominal: soft, non-tender, non-distended, bowel sounds normal Neurological: alert & oriented x3, moving all extremities equally Skin: warm and dry; linear vertical scar noted to medial aspect of left thigh Extremities: Warm and well-perfused, 1+ pitting edema bilaterally with surface wrinkles Psych: normal mood and affect, thought content normal  Patient Lines/Drains/Airways Status     Active Line/Drains/Airways     Name Placement date Placement time Site Days   Peripheral IV 07/11/24 18 G Anterior;Left Forearm 07/11/24  1803  Forearm  4   Peripheral IV 07/11/24 20 G Anterior;Proximal;Right Forearm 07/11/24  1903  Forearm  4            Pertinent labs and imaging:     Latest Ref Rng & Units 07/15/2024    4:32 AM 07/14/2024    8:21 AM 07/14/2024    8:15 AM  CBC  WBC 4.0 - 10.5 K/uL 10.5     Hemoglobin 12.0 - 15.0 g/dL 88.0  12.6  11.9  11.6   Hematocrit 36.0 - 46.0 % 38.1  37.0    35.0  34.0   Platelets 150 - 400 K/uL 345          Latest Ref Rng & Units 07/15/2024    4:32 AM 07/14/2024    8:21 AM 07/14/2024    8:15 AM  CMP  Glucose 70 - 99 mg/dL 874     BUN 8 - 23 mg/dL 25     Creatinine 9.55 - 1.00 mg/dL 9.21     Sodium 864 - 854 mmol/L 141  142    140  143   Potassium 3.5 - 5.1 mmol/L 4.1  3.9    3.7  3.6   Chloride 98 - 111 mmol/L 100     CO2 22 - 32 mmol/L 33     Calcium  8.9 - 10.3 mg/dL 9.1       CARDIAC CATHETERIZATION Result  Date: 07/15/2024   Prox LAD lesion is 55% stenosed.   Mid LAD to Dist LAD lesion is 70% stenosed.   Lat 1st Mrg lesion is 90% stenosed.   1st Mrg lesion is 70% stenosed.   Prox Cx to Mid Cx lesion is 80% stenosed.   Ost RCA to Dist RCA lesion is 100% stenosed.   LV end diastolic pressure is severely elevated.   Hemodynamic findings consistent with moderate pulmonary hypertension. 3 vessel obstructive CAD- diffuse diabetic disease High LV filling pressures. PCWP 26/27, mean 30 mm Hg. LVEDP 40 mm Hg Moderate pulmonary HTN. PAP 47/26, mean 35 mm Hg RA pressure 17/16, mean 14 mm Hg Cardiac output 4.61 L/min, index 2.66 Plan: recommend optimizing CHF therapy. Fortunately she does not have left main disease and LAD while diffusely diseased is not severe. She is a poor candidate for CABG due to multiple co morbidities. I expect with improvement in LV pressures her angina will improve.  Would manage her CAD medically.  Will hold lisinopril  in favor of ARB transitioning to Entresto. Needs continued diuresis.     ASSESSMENT/PLAN:  Assessment: Principal Problem:   COPD exacerbation (HCC) Active Problems:   Diabetes mellitus, type II, insulin  dependent (HCC)   Tobacco dependence   COPD (chronic obstructive pulmonary disease) (HCC)   Essential hypertension, benign   Ovarian cancer (HCC)   Pneumonia   Hyperglycemia   Asthma with acute exacerbation   Elevated brain natriuretic peptide (BNP) level   Generalized anxiety disorder   Coronary artery disease involving native coronary artery of native heart with unstable angina pectoris (HCC)   Acute systolic heart failure (HCC)   Vanessa [name] is a 64 y.o. female with a history of chronic hypoxic respiratory failure on 2LNC, suspected COPD (no documented PFTs), HFmrEF, angina, PAD s/p multiple revascularizations, T2DM, HTN, HLD, TUD, depression, anxiety, and panic attacks who presented with SOB and admitted for acute hypoxic and hypercarbic respiratory failure d/t  panic attack + HFrEF exacerbation, now on hospital day 3.  Plan: #Acute hypoxic and hypercarbic respiratory failure 2/2 anxiety/panic attack #COPD with chronic hypoxia without exacerbation #HFrEF exacerbation Patient resting comfortably on 4L HFNC. No subjective SOB or leg edema per patient. She had about -1.4 L net loss over the past 24 hours. Initial hypoxic and hypercarbic respiratory failure thought to be induced by panic attack now with possible HFrEF exacerbation in setting of ischemic cardiomyopathy contributing. Updated TTE this admissions showing EF 35-40% with LMWA and grade 2 diastolic dysfunction. Pressures were soft overnight and will have to slowly titrate her  medications to add on GDMT per Cardiology. Will continue diuresing and adding on GDMT per Cardiology. Discontinued lisinopril  and awaiting 36 hour washout for Entresto. Reduced metoprolol  due to concerns for bradycardia and hypotension as well. Given chronic obstructive lung disease, O2 goal of 88-92%. Can trial reduction in O2 to meet this goal. - Cardiology consulted, appreciate recs: - IV Lasix  80 mg bid - Start spironolactone  25 mg daily - Reduce metoprolol  succinate to 12.5 mg daily - Lisinopril  discontinued, awaiting 36-hour washout for Entresto - Strict I/O's, daily weights - Trend BMPs while diuresing - DuoNebs every 4 hours as needed - Continue Breztri  inhaler (switched from home Trelegy Ellipta  while admitted) - Scheduled albuterol  inhaler 4 times daily - Mucinex  600 mg twice daily - Continue citalopram  20 mg daily - Continue hydroxyzine  25 mg tid prn  #NSTEMI #Multivessel CAD No active chest pain on evaluation this morning. Troponin peaked at 423. S/p catheterization yesterday which showed multivessel CAD c/w diffuse diabetic disease, nothing amenable to PCI. No immediate plans for CABG but may revisit once diabetes under better control. Already taking ASA and Plavix  for extensive PAD but will continue for CAD  per Cardiology. Appreciate further cardiology recs.   - Continue ASA 81 mg daily - Continue Clopidogrel  75 mg daily - Discontinue Heparin  - Discontinue Imdur  30 mg daily due to hypotension  #Severe hyperglycemia #T2DM Glucoses in the 80s this AM. Last A1c of 12.4% on 07/01/24. Currently home regimen of Semglee  36 units daily, 6 units Humalog 3 times daily before meals, Mounjaro  2.5 mg weekly but has not started yet. Intolerant to SGLT2i due to mycotic infections and metformin  due to GI distress. Hyperglycemia on admission likely severe hyperglycemia vs HHS. Rapid improvement with EndoTool and now on SQ insulin . Will continue resistant SSI and continue Semglee . - Resistant SSI - Semglee  26 units daily - Carb-modified diet - Trend CBGs  #Peripheral Arterial Disease s/p multiple revascularizations #Mild Right Carotid Artery Stenosis  Follows with Dr. Serene with vascular surgery. S/p right axillary to common femoral and left femoral to popliteal bypass grafts. Carotid US  showed 1-39% stenosis in left ICA and 40-59% in right ICA. No indications for CEA at this time. No other acute concerns regarding PAD. Will continue to monitor for active symptoms. 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 Viktoria.  CEA s/p procedures was elevated at 6.44 but still decreasing from prior value of 14.40.  She was recommended to follow-up with GI for further workup, but do not see any definitive documentation of a follow-up visit.  No acute concerns during this admission. Will repeat CEA and follow up in [facility] for GI referral if necessary (follows with Dr. Abran). - CEA pending  #HTN BP better controlled with lisinopril . Now systolic BP 130s. Home meds are Lisinopril  5 mg and Carvedilol  6.25 mg bid. Home medications will be changed per  Cardiology. - Discontinue home Lisinopril  5 mg daily  #HLD Last LDL of 182 and HDL 33 on 07/01/2024.  Currently taking Lipitor 80 mg daily and Zetia  10 mg daily.  Per cardiology, patient is pending referral to lipid clinic to assess need for Repatha in the outpatient setting. - Continue home Lipitor and Zetia  - Follow-up outpatient in lipid clinic pending referral  #Depression On evaluation this morning patient had normal affect and mood. Not currently experiencing anxiety or  feelings of panic. Continuing home medications but will decrease citaloram to 20 mg daily due to increased risk of prolonged QTC in patients older than 60. - Continue citalopram  20 mg daily - Continue Wellbutrin  SR 150 mg twice daily  #Tobacco Use Disorder Currently in remission.  Patient with 43-pack-year history, quit 1 year ago.  Now currently vapes nicotine  daily.  Currently on Wellbutrin  which will also help with cravings.  Counseled patient on cessation of vaping. Will offer nicotine  patch if she has cravings while admitted. - Continue Wellbutrin  SR 150 mg twice daily   Best Practice: Diet: Carb-modified IVF: Fluids: None, Rate: None VTE: heparin  injection 5,000 Units Start: 07/14/24 2200heparin injection 5,000 Units Start: 07/14/24 2200 Code: Full  Disposition planning: Therapy Recs: Pending, DME: none Family Contact: Eleanor Graff (Daughter), to be notified. DISPO: Anticipated discharge to Home pending clinical improvement and further workup.  Signature:  Letha Cheadle, MD [facility]  PGY-1 07/15/2024, 6:10 AM  On Call pager [phone]  "

## 2024-07-16 DIAGNOSIS — J9691 Respiratory failure, unspecified with hypoxia: Secondary | ICD-10-CM | POA: Diagnosis present

## 2024-07-16 DIAGNOSIS — J9602 Acute respiratory failure with hypercapnia: Secondary | ICD-10-CM | POA: Diagnosis not present

## 2024-07-16 DIAGNOSIS — I5020 Unspecified systolic (congestive) heart failure: Secondary | ICD-10-CM | POA: Diagnosis not present

## 2024-07-16 DIAGNOSIS — I509 Heart failure, unspecified: Secondary | ICD-10-CM

## 2024-07-16 DIAGNOSIS — J9601 Acute respiratory failure with hypoxia: Secondary | ICD-10-CM | POA: Diagnosis not present

## 2024-07-16 DIAGNOSIS — I110 Hypertensive heart disease with heart failure: Secondary | ICD-10-CM | POA: Diagnosis not present

## 2024-07-16 LAB — CBC
HCT: 38.8 % (ref 36.0–46.0)
Hemoglobin: 12.7 g/dL (ref 12.0–15.0)
MCH: 29.7 pg (ref 26.0–34.0)
MCHC: 32.7 g/dL (ref 30.0–36.0)
MCV: 90.7 fL (ref 80.0–100.0)
Platelets: 359 K/uL (ref 150–400)
RBC: 4.28 MIL/uL (ref 3.87–5.11)
RDW: 13.2 % (ref 11.5–15.5)
WBC: 11.7 K/uL — ABNORMAL HIGH (ref 4.0–10.5)
nRBC: 0 % (ref 0.0–0.2)

## 2024-07-16 LAB — LIPOPROTEIN A (LPA): Lipoprotein (a): 131.9 nmol/L — ABNORMAL HIGH

## 2024-07-16 LAB — BASIC METABOLIC PANEL WITH GFR
Anion gap: 9 (ref 5–15)
BUN: 22 mg/dL (ref 8–23)
CO2: 32 mmol/L (ref 22–32)
Calcium: 9.4 mg/dL (ref 8.9–10.3)
Chloride: 97 mmol/L — ABNORMAL LOW (ref 98–111)
Creatinine, Ser: 0.67 mg/dL (ref 0.44–1.00)
GFR, Estimated: >60 mL/min
Glucose, Bld: 198 mg/dL — ABNORMAL HIGH (ref 70–99)
Potassium: 3.8 mmol/L (ref 3.5–5.1)
Sodium: 138 mmol/L (ref 135–145)

## 2024-07-16 LAB — GLUCOSE, CAPILLARY
Glucose-Capillary: 159 mg/dL — ABNORMAL HIGH (ref 70–99)
Glucose-Capillary: 190 mg/dL — ABNORMAL HIGH (ref 70–99)
Glucose-Capillary: 168 mg/dL — ABNORMAL HIGH (ref 70–99)
Glucose-Capillary: 148 mg/dL — ABNORMAL HIGH (ref 70–99)

## 2024-07-16 LAB — CEA: CEA: 1.7 ng/mL (ref 0.0–4.7)

## 2024-07-16 MED ORDER — NICOTINE 21 MG/24HR TD PT24
21.0000 mg | MEDICATED_PATCH | Freq: Every day | TRANSDERMAL | Status: DC
  Filled 2024-07-16: qty 1

## 2024-07-16 MED ORDER — LOSARTAN POTASSIUM 25 MG PO TABS
25.0000 mg | ORAL_TABLET | Freq: Every day | ORAL | Status: DC
  Administered 2024-07-16 – 2024-07-17 (×2): 25 mg via ORAL
  Filled 2024-07-16 (×2): qty 1

## 2024-07-16 MED ORDER — POLYETHYLENE GLYCOL 3350 17 G PO PACK
17.0000 g | PACK | Freq: Two times a day (BID) | ORAL | Status: DC
  Filled 2024-07-16 (×3): qty 1

## 2024-07-16 MED ORDER — POLYETHYLENE GLYCOL 3350 17 G PO PACK: 17.0000 g | PACK | Freq: Every day | ORAL | Status: DC | PRN

## 2024-07-16 MED ORDER — HYDROCORTISONE (PERIANAL) 2.5 % EX CREA
TOPICAL_CREAM | Freq: Three times a day (TID) | CUTANEOUS | Status: DC
  Filled 2024-07-16: qty 28.35

## 2024-07-16 MED ORDER — INSULIN ASPART 100 UNIT/ML IJ SOLN: 0.0000 [IU] | Freq: Every day | INTRAMUSCULAR | Status: DC

## 2024-07-16 MED ORDER — INSULIN GLARGINE-YFGN 100 UNIT/ML ~~LOC~~ SOLN
30.0000 [IU] | Freq: Every day | SUBCUTANEOUS | Status: DC
  Administered 2024-07-16 – 2024-07-17 (×2): 30 [IU] via SUBCUTANEOUS
  Filled 2024-07-16 (×2): qty 0.3

## 2024-07-16 MED ORDER — POTASSIUM CHLORIDE CRYS ER 20 MEQ PO TBCR
20.0000 meq | EXTENDED_RELEASE_TABLET | Freq: Once | ORAL | Status: AC
  Administered 2024-07-16: 20 meq via ORAL
  Filled 2024-07-16: qty 1

## 2024-07-16 MED ORDER — INSULIN ASPART 100 UNIT/ML IJ SOLN
0.0000 [IU] | Freq: Three times a day (TID) | INTRAMUSCULAR | Status: DC
  Administered 2024-07-16 – 2024-07-17 (×3): 4 [IU] via SUBCUTANEOUS
  Filled 2024-07-16 (×3): qty 4

## 2024-07-16 NOTE — Progress Notes (Signed)
" ° °  Heart Failure Stewardship Pharmacist Progress Note   PCP: Jolaine Pac, DO PCP-Cardiologist: Peter Jordan, MD    HPI:  64 year old female with a PMH significant for COPD on 2 L oxygen PTA, asthma, T2DM, HTN, HLD, severe PAD s/p revascuarization, depression/anxiety, and tobacco abuse (former smoker -- 43 pack-years but currently vapes daily) who presented on 07/11/24 with shortness of breath. Her family stated that she has had intermittent chest pain over the past few weeks and had followed with cardiology who planned to do a cardiac cath.  CXR showed diffuse interstitial changes with small bilateral pleural effision and left lower lobe atlectasis without focal consolidation. ECHO revealed an EF of 35-40%. She underwet a R/LHC on 1/18 which showed 3 vessel obstructive CAD, high LV filling pressures -- PCWP mean 30, LVEDP 40, mean PAP  30, and CO/CI 4.61/2.66. She was deemed a poor candidate for a CABG given her multiple comorbidities.  Patient is doing well today. She denies SOB, chest pain or issues lying flat. She has minimal lower extremity edema present on physical exam. She is currently on 2 L O2 [place]. Her K is 3.8 today and was repleted with 20 mEq x1 after talking with the cardiology PA. Losartan  was started this morning, and the patient denies feeling lightheadedness or dizziness. Discussed watching her BP today to see if we could transition the losartan  to Entresto, which she said the cardiologist also said.  Current HF Medications: Diuretic: IV furosemide  80 mg BID Beta Blocker: metoprolol  succinate 25 mg daily ACE/ARB/ARNI: losartan  25 mg daily MRA: spironolactone  12.5 mg daily  Prior to admission HF Medications: Beta blocker: carvedilol  6.25 mg BID ACE/ARB/ARNI: lisinopril  5 mg daily  Pertinent Lab Values: Serum creatinine 0.67, BUN 22, Potassium 3.8, Sodium 138, proBNP 854, Magnesium  2.2, A1c 12.4%  Vital Signs: Weight: 159 lbs (admission weight: 158 lbs) Blood pressure:  90s-120/40s-60s  Heart rate: 60s I/O: net -1.7 L yesterday; net -5.2 L since admission  Medication Assistance / Insurance Benefits Check: Does the patient have prescription insurance?  Yes Type of insurance plan: Medicare and Medicaid  Outpatient Pharmacy:  Prior to admission outpatient pharmacy: [facility] Is the patient willing to use [facility] pharmacy at discharge? Yes Is the patient willing to transition their outpatient pharmacy to utilize a [facility] outpatient pharmacy?   Yes    Assessment: 1. Acute on chronic systolic CHF (LVEF 35-40%), due to ichemic etiology. NYHA class II symptoms. - Minimal lower extremity edema present on physical exam > continue IV furosemide  80 mg BID - K 3.8  - BP soft > losartan  added today (last dose of PTA lisinopril  on 1/18); monitor BP today to determine if could switch to ARNI - A1c 12.4% + history of yeast infection on Jardiance  > would hold off on SGLT2i   Plan: 1) Medication changes recommended at this time: - Replete potassium with 20 mEq x1  2)  Education  - To be completed prior to discharge  B. Maegan Ysabel Cowgill, PharmD PGY-1 Pharmacy Resident [facility] 07/16/2024 8:45 AM    "

## 2024-07-16 NOTE — Progress Notes (Addendum)
 "  Progress Note  Patient Name: Vanessa [name] Date of Encounter: 07/16/2024  [facility] Cardiologist: Peter Jordan, MD   Interval Summary   Patient is doing well today. Denies any chest pain, dyspnea, lower extremity swelling. States that she has been able to ambulate to and from the bathroom/around her room without difficulty or symptoms. On baseline 2 L/min O2 via [place]. Per nursing was seen vaping overnight, nicotine  patches ordered by primary team  Vital Signs Vitals:   07/15/24 1957 07/16/24 0026 07/16/24 0502 07/16/24 0758  BP:  (!) 104/59 (!) 112/46 (!) 109/58  Pulse:  62 (!) 59 62  Resp:  18 20 16   Temp:  98.6 F (37 C) 98.6 F (37 C) 98.2 F (36.8 C)  TempSrc:  Oral Oral Oral  SpO2: 92% 92% 96% 94%  Weight:   70.9 kg   Height:        Intake/Output Summary (Last 24 hours) at 07/16/2024 0857 Last data filed at 07/16/2024 0505 Gross per 24 hour  Intake 280 ml  Output 1000 ml  Net -720 ml      07/16/2024    5:02 AM 07/14/2024    3:53 AM 07/13/2024    4:17 AM  Last 3 Weights  Weight (lbs) 156 lb 3.2 oz 159 lb 1.6 oz 164 lb  Weight (kg) 70.852 kg 72.167 kg 74.39 kg      Telemetry/ECG  Sinus rhythm, rates in the 60s - Personally Reviewed  Physical Exam  GEN: No acute distress, on 2 L/min O2 via [place]   Neck: No JVD Cardiac: RRR, no murmurs, rubs, or gallops.  Respiratory: Mild crackles at lung bases bilaterally GI: Soft, nontender MS: No peripheral edema  Assessment & Plan  NSTEMI, type 1 Multivessel CAD Hyperlipidemia Presented with sudden onset SOB, denied any angina but had typical angina (burning chest pain with exertion and eating) that started in September 2025 that gradually worsened. Frequency 3x/week. Was originally scheduled for outpatient LHC on Monday, 07/14/24 Hs-troponins elevated, peaked at 423 proBNP elevated 854 EKG showed NSR, no Q waves in anterior leads Echo showed LVEF 35-40% with severe HK of entire lateral wall, new change compared to  prior echo [facility] showed 3-vessel obstructive CAD/diffuse diabetic disease with high LV filling pressures (PCWP 26/27, mean 30 mmHg, LVEDP 40 mmHg), moderate pulmonary HTN (PAP 47/26, mean 35 mmHg) Poor CABG candidate due to multiple comorbidities, angina likely to improve with improvement in LV pressures. Will optimize CHF therapy and manage CAD medically Continue ASA and Plavix  x 12 months, followed by Plavix  monotherapy Continue atorvastatin  80 mg daily, Zetia  Continue metoprolol  succinate 25 mg daily Imdur  discontinued 1/20 due to hypotension Start losartan  25 mg daily, monitor for hypotension   Acute systolic and diastolic heart failure Presentation as above Echo showed LVEF 35-40%, RWMA (severe HK of entire lateral wall), G2DD, normal RV systolic function, dilated IVC RHC showed elevated filling pressures (PCWP 26/27, mean 30 mmHg, LVEDP 40 mmHg. PAP 47/26, mean 35 mmHg. RA pressure 17/16, mean 14 mmHg.) Net I/O -5.2 L since admission, with -2 L in the past 24 hours Weight 74.4 kg >> 70.9 kg Stable creatinine Will defer SGLT2i for now given uncontrolled diabetes. Known intolerance to Jardiance  due to history of severe yeast infection Discontinue lisinopril  in favor for Entresto, will need 36 hour washout period before starting Entresto 1/21. Due to soft BPs, will start losartan  25 mg daily and monitor BP response Continue metoprolol  succinate 25 mg daily Continue spironolactone  12.5 mg  daily Given markedly elevated filling pressures, will continue aggressive diuresis with IV Lasix  80 mg BID Continue strict I/O, daily BMPs, daily weights   Hypertension Lisinopril  was discontinued for washout period prior to starting Entresto 1/21. Due to soft BPs, will plan to start losartan  25 mg daily and monitor BP response Soft BP since starting metoprolol  succinate 25 mg daily, discontinued Imdur , will CTM   Hypokalemia K 3.8 today, will supplement with 20 mEq  PAD s/p multiple  revascularizations Follows with vascular surgery (Dr. Serene) S/p right axillary to common femoral and left femoral to popliteal bypass grafts Carotid US  showed 1-39% stenosis in left ICA and 40-59% in right ICA, no indications for CEA at this time Continue home ASA and Plavix  Ongoing management per primary   Per primary Chronic hypoxic respiratory failure Type 2 diabetes mellitus COPD Depression/anxiety/panic attacks   For questions or updates, please contact [facility] Please consult [email or web address] for contact info under         Signed, Owen MARLA Daniels, PA-C   "

## 2024-07-16 NOTE — Progress Notes (Signed)
 "  HD#4 SUBJECTIVE:  Patient Summary: Vanessa [name] is a 64 y.o. female with a pertinent PMH of chronic hypoxic respiratory failure on 2LNC, suspected COPD (no documented PFTs), HFmrEF, angina, PAD s/p multiple revascularizations, T2DM, HTN, HLD, TUD, depression, anxiety, and panic attacks who presented with SOB and admitted for acute hypoxic and hypercarbic respiratory failure d/t panic attack + HFrEF exacerbation.   Overnight Events: No acute events overnight. RN notified the team that patient was found vaping in the bathroom.   Interim History: Patient reports no active chest pain, shortness of breath, or other acute concerns.  She has been eating well in the hospital and notes that her last bowel movement was Friday before her admission.  This is normal to her and she declined any medications to help her move her bowels.  Has not noticed any other bleeding symptoms.  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  Notes that she was vaping in the bathroom yesterday and her vape was taken by hospital staff.  Does not want a nicotine  patch.  No other acute concerns.  OBJECTIVE:  Vital Signs: Vitals:   07/15/24 1957 07/16/24 0026 07/16/24 0502 07/16/24 0758  BP:  (!) 104/59 (!) 112/46 (!) 109/58  Pulse:  62 (!) 59 62  Resp:  18 20 16   Temp:  98.6 F (37 C) 98.6 F (37 C) 98.2 F (36.8 C)  TempSrc:  Oral Oral Oral  SpO2: 92% 92% 96% 94%  Weight:   70.9 kg   Height:        Filed Weights   07/13/24 0417 07/14/24 0353 07/16/24 0502  Weight: 74.4 kg 72.2 kg 70.9 kg     Intake/Output Summary (Last 24 hours) at 07/16/2024 1048 Last data filed at 07/16/2024 0505 Gross per 24 hour  Intake 280 ml  Output 1000 ml  Net -720 ml   Net IO Since Admission: -5,226.31 mL [07/16/24 1048]  Physical Exam: Constitutional: Chronically ill-appearing female in no acute  distress HENT: normocephalic atraumatic, mucous membranes moist, scattered xanthelasmas around eyes Eyes: conjunctiva non-erythematous, PERRL, no scleral icterus Cardiovascular: regular rate and rhythm, + soft systolic murmur heard best LUSB Pulmonary/Chest: Normal work of breathing on 2L [place], diffuse inspiratory crackles in dependent posterior lung fields, unchanged from yesterday Abdominal: soft, non-tender, non-distended, bowel sounds normal Neurological: alert & oriented x3, moving all extremities equally Skin: warm and dry; linear vertical scar noted to medial aspect of left thigh Extremities: Warm and well-perfused, trace pitting edema bilaterally with surface wrinkles Psych: normal mood and affect, thought content normal  Patient Lines/Drains/Airways Status     Active Line/Drains/Airways     Name Placement date Placement time Site Days   Peripheral IV 07/11/24 18 G Anterior;Left Forearm 07/11/24  1803  Forearm  5   Peripheral IV 07/11/24 20 G Anterior;Proximal;Right Forearm 07/11/24  1903  Forearm  5            Pertinent labs and imaging:     Latest Ref Rng & Units 07/16/2024    5:01 AM 07/15/2024    4:32 AM 07/14/2024    8:21 AM  CBC  WBC 4.0 - 10.5 K/uL 11.7  10.5    Hemoglobin 12.0 - 15.0 g/dL 87.2  88.0  87.3    88.0   Hematocrit 36.0 - 46.0 % 38.8  38.1  37.0    35.0  Platelets 150 - 400 K/uL 359  345         Latest Ref Rng & Units 07/16/2024    5:01 AM 07/15/2024    4:32 AM 07/14/2024    8:21 AM  CMP  Glucose 70 - 99 mg/dL 801  874    BUN 8 - 23 mg/dL 22  25    Creatinine 9.55 - 1.00 mg/dL 9.32  9.21    Sodium 864 - 145 mmol/L 138  141  142    140   Potassium 3.5 - 5.1 mmol/L 3.8  4.1  3.9    3.7   Chloride 98 - 111 mmol/L 97  100    CO2 22 - 32 mmol/L 32  33    Calcium  8.9 - 10.3 mg/dL 9.4  9.1       ASSESSMENT/PLAN:  Assessment: Principal Problem:   COPD exacerbation (HCC) Active Problems:   Diabetes mellitus, type II, insulin  dependent (HCC)    Tobacco dependence   COPD (chronic obstructive pulmonary disease) (HCC)   Essential hypertension, benign   Ovarian cancer (HCC)   Pneumonia   Hyperglycemia   Asthma with acute exacerbation   Elevated brain natriuretic peptide (BNP) level   Generalized anxiety disorder   Coronary artery disease involving native coronary artery of native heart with unstable angina pectoris (HCC)   Acute systolic heart failure (HCC)   Non-ST elevation (NSTEMI) myocardial infarction (HCC)   Vanessa [name] is a 65 y.o. female with a history of chronic hypoxic respiratory failure on 2LNC, suspected COPD (no documented PFTs), HFmrEF, angina, PAD s/p multiple revascularizations, T2DM, HTN, HLD, TUD, depression, anxiety, and panic attacks who presented with SOB and admitted for acute hypoxic and hypercarbic respiratory failure d/t panic attack + HFrEF exacerbation, now on hospital day 4.  Plan: #Acute hypoxic and hypercarbic respiratory failure 2/2 anxiety/panic attack #COPD with chronic hypoxia without exacerbation #HFrEF exacerbation Patient resting comfortably on 2LNC. No subjective SOB or leg edema per patient. She had about -1.7 L net loss over the past 24 hours. Initial hypoxic and hypercarbic respiratory failure thought to be induced by panic attack now with possible HFrEF exacerbation in setting of ischemic cardiomyopathy contributing. Updated TTE this admissions showing EF 35-40% with LMWA and grade 2 diastolic dysfunction. Pressures continue to be soft overnight and will have to slowly titrate her medications to add on GDMT per Cardiology. Will continue diuresing and adding on GDMT per Cardiology. Given chronic obstructive lung disease, O2 goal is 88-92%, has been above goal with [facility]. May not need supplemental O2. - Cardiology consulted, appreciate recs: - IV Lasix  80 mg bid - Continue spironolactone  12.5 mg daily - Continue metoprolol  succinate 25 mg daily - Start Losartan  25 mg daily - Strict I/O's, daily  weights - Trend BMPs while diuresing - DuoNebs every 4 hours as needed - Continue Breztri  inhaler (switched from home Trelegy Ellipta  while admitted) - Scheduled albuterol  inhaler 4 times daily - Mucinex  600 mg twice daily - Continue citalopram  20 mg daily - Continue hydroxyzine  25 mg tid prn  #NSTEMI #Multivessel CAD No active chest pain on evaluation this morning. Troponin peaked at 423. S/p catheterization 1/19 which showed multivessel CAD c/w diffuse diabetic disease, nothing amenable to PCI. Elevated filling pressures on RHC. No immediate plans for CABG but may revisit once diabetes is under better control. Already taking ASA and Plavix  for extensive PAD but will continue for CAD per Cardiology. Appreciate further cardiology recs.   - Continue ASA 81 mg  daily for 12 months - Continue Clopidogrel  75 mg daily  #Severe hyperglycemia #T2DM Glucoses in the 80s this AM. Last A1c of 12.4% on 07/01/24. Currently home regimen of Semglee  36 units daily, 6 units Humalog 3 times daily before meals, Mounjaro  2.5 mg weekly but has not started yet. Intolerant to SGLT2i due to mycotic infections and metformin  due to GI distress. Hyperglycemia on admission likely severe hyperglycemia vs HHS. Rapid improvement with EndoTool and now on SQ insulin . Will continue resistant SSI and continue Semglee . - Resistant SSI - Increase Semglee  to 30 units daily - Carb-modified diet - Trend CBGs  #Peripheral Arterial Disease s/p multiple revascularizations #Mild Right Carotid Artery Stenosis  Follows with Dr. Serene with vascular surgery. S/p right axillary to common femoral and left femoral to popliteal bypass grafts. Carotid US  showed 1-39% stenosis in left ICA and 40-59% in right ICA. No indications for CEA at this time. No other acute concerns regarding PAD. Will continue to monitor for active symptoms. - Continue home ASA + Plavix  as above  #Ovarian CA s/p hysterectomy and bilateral  salpingo-oophorectomy #Elevated CEA Repeat CEA within normal range. Do not see reason for patient to follow up with GI at this time. 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 Viktoria.  CEA s/p procedures was elevated at 6.44 but still decreasing from prior value of 14.40.  She was recommended to follow-up with GI for further workup, but do not see any definitive documentation of a follow-up visit.  #HTN Soft pressures while titrating GDMT. Previously on home meds of Lisinopril  5 mg and Carvedilol  6.25 mg bid. Cardiology following, appreciate medication adjustment recommendations. - Start Losartan  25 mg daily   #HLD Last LDL of 182 and HDL 33 on 07/01/2024.  Currently taking Lipitor 80 mg daily and Zetia  10 mg daily.  Per cardiology, patient is pending referral to lipid clinic to assess need for Repatha in the outpatient setting. - Continue home Lipitor and Zetia  - Follow-up outpatient in lipid clinic pending referral  #Depression On evaluation this morning patient had normal affect and mood. Not currently experiencing anxiety or feelings of panic. Continuing home medications but will decrease citaloram to 20 mg daily due to increased risk of prolonged QTC in patients older than 60. - Continue citalopram  20 mg daily - Continue Wellbutrin  SR 150 mg twice daily  #Tobacco Use Disorder Currently in remission however patient found vaping (nicotine ) in the bathroom.  Patient with 43-pack-year history, quit 1 year ago.  Currently on Wellbutrin  which can help with cravings.  Counseled patient on cessation of vaping. - Continue Wellbutrin  SR 150 mg twice daily - Nicotine  patch 21 mg daily   Best Practice: Diet: Carb-modified IVF: Fluids: None, Rate: None VTE: heparin  injection 5,000 Units Start: 07/14/24 2200heparin injection 5,000 Units Start: 07/14/24 2200 Code: Full  Disposition planning: Therapy Recs: Pending, DME: none Family  Contact: Eleanor Graff (Daughter), to be notified. DISPO: Anticipated discharge to Home pending clinical improvement and further workup.  Signature:  Letha Cheadle, MD [facility]  PGY-1 07/16/2024, 10:48 AM  On Call pager [phone]  "

## 2024-07-16 NOTE — Progress Notes (Signed)
" °  Progress Note   Date: 07/15/2024  Patient Name: Vanessa [name]        MRN#: [id number]  Clarification of the patients mental status:  Other explanation of clinical findings (please specify): hypercarbic respiratory failure cause of drowsiness and confusion     "

## 2024-07-16 NOTE — Progress Notes (Signed)
" °  Progress Note   Date: 07/15/2024  Patient Name: Vanessa [name]        MRN#: [id number]  Clarification of the patients mental status:  Other explanation of clinical findings (please specify):  acute hypercarbic encephalopathy most likely cause of her  confusion and drowsiness     "

## 2024-07-16 NOTE — Progress Notes (Signed)
 Physical Therapy Treatment Patient Details Name: Vanessa [name] MRN: [id number] DOB: [birth year] Today's Date: 07/16/2024   History of Present Illness 64 y.o. female presenting 1/16 with SOB. Work up revealed hyperglycemia >500, mall bilateral pleural effusions, and elevated troponin. Admitted for management of COPD exacerbation, NSTEMI, and acute heart failure. S/p L/R heart cath and coronary angiography 1/19. PMH includes: COPD on 2L, asthma, poorly controlled DM II, HTN, HLD, severe PAD s/p revascularizations, and depression/anxiety/panic attacks.   PT Comments  Pt received in supine and agreeable to PT session with encouragement. Pt reported being close to mobility baseline being ModI for bed mobility and to stand with no AD. While ambulating in the room, pt required supervision for safety. With increased distance, pt became unsteady requiring CGA/MinA for steadying assist. Pt had three small lateral losses of balance that pt attributed to prior R toe amputations. Discussed using DME in future sessions to provide steadying assistance. Pt was also able to negotiate three steps with one handrail and CGA. Pt has no handrails at home with discussion on using 1HH support from son with pt verbalizing understanding. Will continue to follow acutely with recommendation for HHPT.      If plan is discharge home, recommend the following: A little help with walking and/or transfers;Assist for transportation;Help with stairs or ramp for entrance   Can travel by private vehicle      Yes  Equipment Recommendations  None recommended by PT       Precautions / Restrictions Precautions Precautions: Fall Recall of Precautions/Restrictions: Impaired Precaution/Restrictions Comments: watch O2 Restrictions Weight Bearing Restrictions Per Provider Order: No     Mobility  Bed Mobility Overal bed mobility: Independent     Transfers Overall transfer level: Modified independent Equipment used: None Transfers:  Sit to/from Stand   Ambulation/Gait Ambulation/Gait assistance: Contact guard assist, Min assist Gait Distance (Feet): 300 Feet Assistive device: None Gait Pattern/deviations: Step-through pattern, Decreased stride length Gait velocity: decreased    General Gait Details: x3 lateral losses of balance requiring MinA to correct. Would occasionally reach for UE support from hallway rail   Stairs Stairs: Yes Stairs assistance: Contact guard assist Stair Management: One rail Right, Forwards, Backwards Number of Stairs: 3 General stair comments: preferred to use one handrail to mimic 1HH support upon d/c home. Steady with no LOB      Balance Overall balance assessment: Needs assistance Sitting-balance support: No upper extremity supported, Feet supported Sitting balance-Leahy Scale: Normal     Standing balance support: No upper extremity supported, During functional activity Standing balance-Leahy Scale: Fair Standing balance comment: fair static balance with no UE support, poor tolerance for dynamic balance    Communication Communication Communication: No apparent difficulties  Cognition Arousal: Alert Behavior During Therapy: WFL for tasks assessed/performed   PT - Cognitive impairments: Safety/Judgement      Following commands: Intact      Cueing Cueing Techniques: Verbal cues         Pertinent Vitals/Pain Pain Assessment Pain Assessment: No/denies pain     PT Goals (current goals can now be found in the care plan section) Acute Rehab PT Goals Patient Stated Goal: to return home PT Goal Formulation: With patient Time For Goal Achievement: 07/27/24 Potential to Achieve Goals: Good Progress towards PT goals: Progressing toward goals    Frequency    Min 1X/week       AM-PAC PT 6 Clicks Mobility   Outcome Measure  Help needed turning from your back to your side while  in a flat bed without using bedrails?: None Help needed moving from lying on your  back to sitting on the side of a flat bed without using bedrails?: None Help needed moving to and from a bed to a chair (including a wheelchair)?: A Little Help needed standing up from a chair using your arms (e.g., wheelchair or bedside chair)?: None Help needed to walk in hospital room?: A Little Help needed climbing 3-5 steps with a railing? : A Little 6 Click Score: 21    End of Session Equipment Utilized During Treatment: Oxygen Activity Tolerance: Patient tolerated treatment well Patient left: in bed;with call bell/phone within reach Nurse Communication: Mobility status PT Visit Diagnosis: Unsteadiness on feet (R26.81)     Time: 8563-8546 PT Time Calculation (min) (ACUTE ONLY): 17 min  Charges:    $Therapeutic Activity: 8-22 mins PT General Charges $$ ACUTE PT VISIT: 1 Visit                    Kate ORN, PT, DPT Secure Chat Preferred  Rehab Office [phone]  Kate BRAVO Wendolyn 07/16/2024, 3:06 PM

## 2024-07-16 NOTE — Progress Notes (Incomplete)
 "  HD#4 SUBJECTIVE:  Patient Summary: Vanessa [name] is a 64 y.o. female with a pertinent PMH of chronic hypoxic respiratory failure on 2LNC, suspected COPD (no documented PFTs), HFmrEF, angina, PAD s/p multiple revascularizations, T2DM, HTN, HLD, TUD, depression, anxiety, and panic attacks who presented with SOB and admitted for acute hypoxic and hypercarbic respiratory failure d/t panic attack + HFrEF exacerbation.   Overnight Events: No acute events overnight. RN notified the team that patient was found vaping in the bathroom.   Interim History: Patient ***  OBJECTIVE:  Vital Signs: Vitals:   07/15/24 1948 07/15/24 1957 07/16/24 0026 07/16/24 0502  BP: (!) 120/54  (!) 104/59 (!) 112/46  Pulse: 69  62 (!) 59  Resp: 18  18 20   Temp: 98 F (36.7 C)  98.6 F (37 C) 98.6 F (37 C)  TempSrc: Oral  Oral Oral  SpO2: 93% 92% 92% 96%  Weight:    70.9 kg  Height:        Filed Weights   07/13/24 0417 07/14/24 0353 07/16/24 0502  Weight: 74.4 kg 72.2 kg 70.9 kg     Intake/Output Summary (Last 24 hours) at 07/16/2024 9348 Last data filed at 07/16/2024 0505 Gross per 24 hour  Intake 280 ml  Output 2000 ml  Net -1720 ml   Net IO Since Admission: -5,226.31 mL [07/16/24 0651]  Physical Exam: Constitutional: Chronically ill-appearing female in no acute distress HENT: normocephalic atraumatic, mucous membranes moist, scattered xanthelasmas around eyes Eyes: conjunctiva non-erythematous, PERRL, no scleral icterus Cardiovascular: regular rate and rhythm, no m/r/g; JVP approximately 7-8 cm   Pulmonary/Chest: Normal work of breathing on [facility] (*** sat), diffuse inspiratory crackles in dependent posterior lung fields*** Abdominal: soft, non-tender, non-distended, bowel sounds normal Neurological: alert & oriented x3, moving all extremities equally Skin: warm and dry; linear vertical scar noted to medial aspect of left thigh Extremities: Warm and well-perfused, ***1+ pitting edema bilaterally  with surface wrinkles Psych: normal mood and affect, thought content normal  Patient Lines/Drains/Airways Status     Active Line/Drains/Airways     Name Placement date Placement time Site Days   Peripheral IV 07/11/24 18 G Anterior;Left Forearm 07/11/24  1803  Forearm  5   Peripheral IV 07/11/24 20 G Anterior;Proximal;Right Forearm 07/11/24  1903  Forearm  5            Pertinent labs and imaging:     Latest Ref Rng & Units 07/16/2024    5:01 AM 07/15/2024    4:32 AM 07/14/2024    8:21 AM  CBC  WBC 4.0 - 10.5 K/uL 11.7  10.5    Hemoglobin 12.0 - 15.0 g/dL 87.2  88.0  87.3    88.0   Hematocrit 36.0 - 46.0 % 38.8  38.1  37.0    35.0   Platelets 150 - 400 K/uL 359  345         Latest Ref Rng & Units 07/16/2024    5:01 AM 07/15/2024    4:32 AM 07/14/2024    8:21 AM  CMP  Glucose 70 - 99 mg/dL 801  874    BUN 8 - 23 mg/dL 22  25    Creatinine 9.55 - 1.00 mg/dL 9.32  9.21    Sodium 864 - 145 mmol/L 138  141  142    140   Potassium 3.5 - 5.1 mmol/L 3.8  4.1  3.9    3.7   Chloride 98 - 111 mmol/L 97  100  CO2 22 - 32 mmol/L 32  33    Calcium  8.9 - 10.3 mg/dL 9.4  9.1      No results found.    ASSESSMENT/PLAN:  Assessment: Principal Problem:   COPD exacerbation (HCC) Active Problems:   Diabetes mellitus, type II, insulin  dependent (HCC)   Tobacco dependence   COPD (chronic obstructive pulmonary disease) (HCC)   Essential hypertension, benign   Ovarian cancer (HCC)   Pneumonia   Hyperglycemia   Asthma with acute exacerbation   Elevated brain natriuretic peptide (BNP) level   Generalized anxiety disorder   Coronary artery disease involving native coronary artery of native heart with unstable angina pectoris (HCC)   Acute systolic heart failure (HCC)   Non-ST elevation (NSTEMI) myocardial infarction (HCC)   Vanessa [name] is a 63 y.o. female with a history of chronic hypoxic respiratory failure on 2LNC, suspected COPD (no documented PFTs), HFmrEF, angina, PAD s/p  multiple revascularizations, T2DM, HTN, HLD, TUD, depression, anxiety, and panic attacks who presented with SOB and admitted for acute hypoxic and hypercarbic respiratory failure d/t panic attack + HFrEF exacerbation, now on hospital day 4.  Plan: #Acute hypoxic and hypercarbic respiratory failure 2/2 anxiety/panic attack #COPD with chronic hypoxia without exacerbation #HFrEF exacerbation Patient resting comfortably on 2LNC. No subjective SOB or leg edema per patient. She had about -1.4 L net loss over the past 24 hours. Initial hypoxic and hypercarbic respiratory failure thought to be induced by panic attack now with possible HFrEF exacerbation in setting of ischemic cardiomyopathy contributing. Updated TTE this admissions showing EF 35-40% with LMWA and grade 2 diastolic dysfunction. Pressures were soft overnight and will have to slowly titrate her medications to add on GDMT per Cardiology. Will continue diuresing and adding on GDMT per Cardiology. Discontinued lisinopril  and awaiting 36 hour washout for Entresto. Reduced metoprolol  due to concerns for bradycardia and hypotension as well. Given chronic obstructive lung disease, O2 goal of 88-92%. Can trial reduction in O2 to meet this goal. - Cardiology consulted, appreciate recs: - IV Lasix  80 mg bid - Continue spironolactone  12.5 mg daily - Continue metoprolol  succinate 25 mg daily - Lisinopril  discontinued, awaiting 36-hour washout for Entresto - Strict I/O's, daily weights - Trend BMPs while diuresing - DuoNebs every 4 hours as needed - Continue Breztri  inhaler (switched from home Trelegy Ellipta  while admitted) - Scheduled albuterol  inhaler 4 times daily - Mucinex  600 mg twice daily - Continue citalopram  20 mg daily - Continue hydroxyzine  25 mg tid prn  #NSTEMI #Multivessel CAD No active chest pain on evaluation this morning. Troponin peaked at 423. S/p catheterization 1/19 which showed multivessel CAD c/w diffuse diabetic disease,  nothing amenable to PCI. Elevated filling pressures on RHC. No immediate plans for CABG but may revisit once diabetes is under better control. Already taking ASA and Plavix  for extensive PAD but will continue for CAD per Cardiology. Appreciate further cardiology recs.   - Continue ASA 81 mg daily for 12 months - Continue Clopidogrel  75 mg daily  #Severe hyperglycemia #T2DM Glucoses in the 80s this AM. Last A1c of 12.4% on 07/01/24. Currently home regimen of Semglee  36 units daily, 6 units Humalog 3 times daily before meals, Mounjaro  2.5 mg weekly but has not started yet. Intolerant to SGLT2i due to mycotic infections and metformin  due to GI distress. Hyperglycemia on admission likely severe hyperglycemia vs HHS. Rapid improvement with EndoTool and now on SQ insulin . Will continue resistant SSI and continue Semglee . - Resistant SSI - Increase Semglee  to  30 units daily - Carb-modified diet - Trend CBGs  #Peripheral Arterial Disease s/p multiple revascularizations #Mild Right Carotid Artery Stenosis  Follows with Dr. Serene with vascular surgery. S/p right axillary to common femoral and left femoral to popliteal bypass grafts. Carotid US  showed 1-39% stenosis in left ICA and 40-59% in right ICA. No indications for CEA at this time. No other acute concerns regarding PAD. Will continue to monitor for active symptoms. - Continue home ASA + Plavix  as above  #Ovarian CA s/p hysterectomy and bilateral salpingo-oophorectomy #Elevated CEA Repeat CEA within normal range. Do not see reason for patient to follow up with GI at this time. 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 Viktoria.  CEA s/p procedures was elevated at 6.44 but still decreasing from prior value of 14.40.  She was recommended to follow-up with GI for further workup, but do not see any definitive documentation of a follow-up visit.  #HTN Soft pressures while titrating GDMT.  Previously on home meds of Lisinopril  5 mg and Carvedilol  6.25 mg bid. Cardiology following, appreciate medication adjustment recommendations. - Discontinue home Lisinopril  5 mg daily  #HLD Last LDL of 182 and HDL 33 on 07/01/2024.  Currently taking Lipitor 80 mg daily and Zetia  10 mg daily.  Per cardiology, patient is pending referral to lipid clinic to assess need for Repatha in the outpatient setting. - Continue home Lipitor and Zetia  - Follow-up outpatient in lipid clinic pending referral  #Depression On evaluation this morning patient had normal affect and mood. Not currently experiencing anxiety or feelings of panic. Continuing home medications but will decrease citaloram to 20 mg daily due to increased risk of prolonged QTC in patients older than 60. - Continue citalopram  20 mg daily - Continue Wellbutrin  SR 150 mg twice daily  #Tobacco Use Disorder Currently in remission however patient found vaping (nicotine ) in the bathroom.  Patient with 43-pack-year history, quit 1 year ago.  Currently on Wellbutrin  which can help with cravings.  Counseled patient on cessation of vaping. - Continue Wellbutrin  SR 150 mg twice daily - Nicotine  patch 21 mg daily   Best Practice: Diet: Carb-modified IVF: Fluids: None, Rate: None VTE: heparin  injection 5,000 Units Start: 07/14/24 2200heparin injection 5,000 Units Start: 07/14/24 2200 Code: Full  Disposition planning: Therapy Recs: Pending, DME: none Family Contact: Eleanor Graff (Daughter), to be notified. DISPO: Anticipated discharge to Home pending clinical improvement and further workup.  Signature:  Letha Cheadle, MD [facility]  PGY-1 07/16/2024, 6:51 AM  On Call pager [phone]  "

## 2024-07-17 ENCOUNTER — Other Ambulatory Visit: Payer: Self-pay

## 2024-07-17 ENCOUNTER — Other Ambulatory Visit (HOSPITAL_COMMUNITY): Payer: Self-pay

## 2024-07-17 ENCOUNTER — Telehealth (HOSPITAL_COMMUNITY): Payer: Self-pay

## 2024-07-17 ENCOUNTER — Encounter: Payer: Self-pay | Admitting: Hematology and Oncology

## 2024-07-17 LAB — CBC
HCT: 38.4 % (ref 36.0–46.0)
Hemoglobin: 12.4 g/dL (ref 12.0–15.0)
MCH: 29.7 pg (ref 26.0–34.0)
MCHC: 32.3 g/dL (ref 30.0–36.0)
MCV: 91.9 fL (ref 80.0–100.0)
Platelets: 326 K/uL (ref 150–400)
RBC: 4.18 MIL/uL (ref 3.87–5.11)
RDW: 13.1 % (ref 11.5–15.5)
WBC: 12 K/uL — ABNORMAL HIGH (ref 4.0–10.5)
nRBC: 0 % (ref 0.0–0.2)

## 2024-07-17 LAB — MAGNESIUM: Magnesium: 2.1 mg/dL (ref 1.7–2.4)

## 2024-07-17 LAB — BASIC METABOLIC PANEL WITH GFR
Anion gap: 8 (ref 5–15)
BUN: 22 mg/dL (ref 8–23)
CO2: 31 mmol/L (ref 22–32)
Calcium: 9.1 mg/dL (ref 8.9–10.3)
Chloride: 100 mmol/L (ref 98–111)
Creatinine, Ser: 0.71 mg/dL (ref 0.44–1.00)
GFR, Estimated: >60 mL/min
Glucose, Bld: 115 mg/dL — ABNORMAL HIGH (ref 70–99)
Potassium: 4.2 mmol/L (ref 3.5–5.1)
Sodium: 139 mmol/L (ref 135–145)

## 2024-07-17 LAB — GLUCOSE, CAPILLARY
Glucose-Capillary: 108 mg/dL — ABNORMAL HIGH (ref 70–99)
Glucose-Capillary: 151 mg/dL — ABNORMAL HIGH (ref 70–99)

## 2024-07-17 MED ORDER — HYDROXYZINE HCL 25 MG PO TABS
25.0000 mg | ORAL_TABLET | Freq: Three times a day (TID) | ORAL | 0 refills | Status: AC | PRN
  Filled 2024-07-17: qty 30, 10d supply, fill #0

## 2024-07-17 MED ORDER — METOPROLOL SUCCINATE ER 25 MG PO TB24
25.0000 mg | ORAL_TABLET | Freq: Every day | ORAL | 0 refills | Status: AC
  Filled 2024-07-17: qty 30, 30d supply, fill #0

## 2024-07-17 MED ORDER — OZEMPIC (0.25 OR 0.5 MG/DOSE) 2 MG/3ML ~~LOC~~ SOPN
0.2500 mg | PEN_INJECTOR | SUBCUTANEOUS | 0 refills | Status: AC
  Filled 2024-07-17: qty 0.75, 28d supply, fill #0
  Filled 2024-07-17: qty 3, 56d supply, fill #0

## 2024-07-17 MED ORDER — TORSEMIDE 20 MG PO TABS
40.0000 mg | ORAL_TABLET | Freq: Every day | ORAL | Status: DC
  Administered 2024-07-17: 40 mg via ORAL
  Filled 2024-07-17: qty 2

## 2024-07-17 MED ORDER — LOSARTAN POTASSIUM 25 MG PO TABS
25.0000 mg | ORAL_TABLET | Freq: Every day | ORAL | 0 refills | Status: AC
  Filled 2024-07-17: qty 30, 30d supply, fill #0

## 2024-07-17 MED ORDER — GUAIFENESIN ER 600 MG PO TB12
600.0000 mg | ORAL_TABLET | Freq: Two times a day (BID) | ORAL | 0 refills | Status: AC | PRN
  Filled 2024-07-17: qty 60, 30d supply, fill #0

## 2024-07-17 MED ORDER — SPIRONOLACTONE 25 MG PO TABS
25.0000 mg | ORAL_TABLET | Freq: Every day | ORAL | 0 refills | Status: AC
  Filled 2024-07-17: qty 30, 30d supply, fill #0

## 2024-07-17 MED ORDER — CITALOPRAM HYDROBROMIDE 20 MG PO TABS
20.0000 mg | ORAL_TABLET | Freq: Every day | ORAL | 0 refills | Status: AC
  Filled 2024-07-17: qty 30, 30d supply, fill #0

## 2024-07-17 MED ORDER — TORSEMIDE 20 MG PO TABS
40.0000 mg | ORAL_TABLET | Freq: Every day | ORAL | 0 refills | Status: AC
  Filled 2024-07-17: qty 60, 30d supply, fill #0

## 2024-07-17 NOTE — Discharge Summary (Signed)
 "  Name: Vanessa [name] MRN: [id number] DOB: 01-16-1961 64 y.o. PCP: Jolaine Pac, DO  Date of Admission: 07/11/2024  5:54 PM Date of Discharge: 07/17/2024  Attending Physician: Dr. Mliss Pouch  Discharge Diagnosis: Principal Problem:   COPD exacerbation (HCC) Active Problems:   Diabetes mellitus, type II, insulin  dependent (HCC)   Tobacco dependence   COPD (chronic obstructive pulmonary disease) (HCC)   Essential hypertension, benign   Ovarian cancer (HCC)   Pneumonia   Hyperglycemia   Asthma with acute exacerbation   Elevated brain natriuretic peptide (BNP) level   Generalized anxiety disorder   Coronary artery disease involving native coronary artery of native heart with unstable angina pectoris (HCC)   Acute systolic heart failure (HCC)   Non-ST elevation (NSTEMI) myocardial infarction (HCC)   Acute on chronic congestive heart failure (HCC)   Respiratory failure with hypoxia and hypercapnia (HCC)   Discharge Medications: Allergies as of 07/17/2024       Reactions   Jardiance  [empagliflozin ] Other (See Comments)   Severe yeast infection   Latex Rash   Avandia [rosiglitazone] Other (See Comments)   Headaches        Medication List     STOP taking these medications    carvedilol  6.25 MG tablet Commonly known as: COREG    ibuprofen  200 MG tablet Commonly known as: ADVIL    lisinopril  5 MG tablet Commonly known as: ZESTRIL    Mounjaro  2.5 MG/0.5ML Pen Generic drug: tirzepatide        TAKE these medications    acetaminophen  650 MG CR tablet Commonly known as: TYLENOL  Take 1,300 mg by mouth every 8 (eight) hours as needed for pain.   albuterol  108 (90 Base) MCG/ACT inhaler Commonly known as: VENTOLIN  HFA Inhale 2 puffs into the lungs every 6 (six) hours as needed.   aspirin  EC 81 MG tablet Take 1 tablet (81 mg total) by mouth daily at 6 (six) AM. Swallow whole.   atorvastatin  80 MG tablet Commonly known as: LIPITOR Take 1 tablet (80 mg total) by  mouth daily. What changed: how much to take   Basaglar  KwikPen 100 UNIT/ML Inject 36 Units into the skin daily.   buPROPion  150 MG 12 hr tablet Commonly known as: WELLBUTRIN  SR Take 1 tablet (150 mg total) by mouth 2 (two) times daily.   cetirizine  10 MG tablet Commonly known as: ZYRTEC  Take 10 mg by mouth daily as needed for allergies.   citalopram  20 MG tablet Commonly known as: CELEXA  Take 1 tablet (20 mg total) by mouth daily. Start taking on: July 18, 2024 What changed:  medication strength how much to take   clopidogrel  75 MG tablet Commonly known as: PLAVIX  Take 1 tablet (75 mg total) by mouth daily.   Dexcom G7 Receiver Devi Use with Dexcom G7 sensors to monitor your glucose continuously   Dexcom G7 Sensor Misc Place new sensor every 10 days. Use to monitor blood sugar continuously.   ezetimibe  10 MG tablet Commonly known as: ZETIA  Take 1 tablet (10 mg total) by mouth daily.   gabapentin  300 MG capsule Commonly known as: NEURONTIN  Take 1 capsule (300 mg total) by mouth at bedtime.   glucose blood test strip Use as instructed   guaiFENesin  600 MG 12 hr tablet Commonly known as: MUCINEX  Take 1 tablet (600 mg total) by mouth 2 (two) times daily as needed for cough or to loosen phlegm.   hydrOXYzine  25 MG tablet Commonly known as: ATARAX  Take 1 tablet (25 mg total) by mouth  3 (three) times daily as needed for anxiety.   insulin  lispro 100 UNIT/ML injection Commonly known as: HUMALOG Inject 6 Units into the skin 3 (three) times daily before meals.   losartan  25 MG tablet Commonly known as: COZAAR  Take 1 tablet (25 mg total) by mouth daily. Start taking on: July 18, 2024   metoprolol  succinate 25 MG 24 hr tablet Commonly known as: TOPROL -XL Take 1 tablet (25 mg total) by mouth daily. Start taking on: July 18, 2024   nitroGLYCERIN  0.4 MG SL tablet Commonly known as: NITROSTAT  Place 1 tablet (0.4 mg total) under the tongue every 5 (five)  minutes as needed for chest pain.   Ozempic  (0.25 or 0.5 MG/DOSE) 2 MG/3ML Sopn Generic drug: Semaglutide (0.25 or 0.5MG /DOS) Inject 0.25 mg into the skin once a week.   Pen Needles 31G X 5 MM Misc 1 each by Does not apply route daily. Use to inject insulin    Pen Needles 32G X 4 MM Misc Use 3-4x a day while on insulin  therapy   spironolactone  25 MG tablet Commonly known as: Aldactone  Take 1 tablet (25 mg total) by mouth daily.   Tagamet  HB 200 200 MG tablet Generic drug: cimetidine  Take 200 mg by mouth daily as needed (for heartburn).   torsemide  20 MG tablet Commonly known as: DEMADEX  Take 2 tablets (40 mg total) by mouth daily. Start taking on: July 18, 2024   Trelegy Ellipta  200-62.5-25 MCG/ACT Aepb Generic drug: Fluticasone -Umeclidin-Vilant Inhale 1 puff into the lungs daily.               Durable Medical Equipment  (From admission, onward)           Start     Ordered   07/15/24 1442  For home use only DME Other see comment  Once       Comments: POC Evaluation 1-5 liters pulse dose, if patient qualifies please dispense  Question:  Length of Need  Answer:  Lifetime   07/15/24 1441            Disposition and follow-up:   Ms.Reta L Luff was discharged from [facility] in Stable condition.  At the hospital follow up visit please address:  1.  Follow-up:   a. New HFrEF - reduction in EF to 35-40%.  Cardiology changed the patient to torsemide  40 mg daily and added spironolactone  25 mg daily and losartan  25 mg daily.  Please assess for volume status and symptoms.  Outpatient cardiology started her on 2 L nasal cannula though her saturation was above goal and she may not need this.  Should have cardiology follow-up prior to [facility] follow-up.  Please obtain BMP if not already done to follow potassium.    b. COPD - has home Trelegy Ellipta  but we instructed her to continue Breztri  inhaler from this admission before switching back to her home  Trelegy Ellipta .  Acute hypoxic respiratory failure and hypercarbic respiratory failure likely secondary anxiety.   c. NSTEMI/Multivessel CAD - Had LHC and RHC during admission - no immediate plans for CABG until DM is better controlled per Cards.   d.  Depression/anxiety - reduced citalopram  from 40 mg to 20 mg due to increased risk for QT prolongation in patients over 79 years old.  Continued hydroxyzine  25 mg 3 times daily as needed as this greatly helped the patient.  2.  Labs / imaging needed at time of follow-up: BMP  3.  Pending labs/ test needing follow-up: None   Follow-up  Appointments:  Follow-up Information     [facility]. Go on 07/22/2024.   Specialty: Cardiology Why: Hospital Follow-Up 07/22/24 @ 3:30 PM Please bring all medications to follow-up appointment [facility], Entrance C off of [address] [place] Parking at the door or use [employer] to park under the building. Contact information: [address] [place]  [phone]        D'Mello, [place], DO. Go on 07/24/2024.   Specialty: Internal Medicine Why: You have an appointment with Dr. Kem at 2:15 PM on 07/24/2024 Contact information: [address] [place] [address] [phone]                 Hospital Course by problem list: #Acute hypoxic and hypercarbic respiratory failure 2/2 anxiety/panic attack #COPD with chronic hypoxia without exacerbation #HFrEF exacerbation Patient presented to the ED with shortness of breath and AMS.  She has history of COPD and was recently started on home 2 L nasal cannula per outpatient cardiology, though had not started this yet.  She was found to be hypoxic requiring high flow nasal cannula as well as hypercarbic on VBG which improved with BiPAP.  Briefly required benzodiazepines and Precedex  due to acute anxiety with BiPAP.  BNP initially elevated as  well.  Updated TTE this admission showed new reduction in EF to 35-40% with LMWA and grade 2 diastolic dysfunction.  Her respiratory failure is likely multifactorial in the setting of acute anxiety/panic attack as well as HFrEF exacerbation with component of ischemic cardiomyopathy.  Cardiology followed the patient during her admission and titrated her GDMT as pressures allowed.  She received IV diuresis and transition to p.o. upon day of discharge.  Home GDMT regimen includes spironolactone  25 mg daily, metoprolol  succinate 25 mg daily, losartan  25 mg daily.  Initially was trying to start Entresto but patient's pressures did not tolerate.  Does not tolerate SGLT2i due to mycotic infections.  In terms of COPD, we switched her to Breztri  inhaler while she was in the hospital and discharged her with this.  Instructed her to finish the Breztri  until resuming home Trelegy Ellipta .  #NSTEMI type I #Multivessel CAD Patient never had chest pain during this admission, however has had history of anginal symptoms.  Troponins this admission peaked at 423 and are downtrending.  The patient had already had a previously scheduled catheterization with Dr. Jordan on 1/19 which was completed during this admission.  LHC showed multivessel coronary disease C/W diffuse diabetic disease not amenable to PCI.  Patient was thought to be poor CABG candidate given her uncontrolled diabetes, however this could be further considered after better control.  Cardiology recommended to continue aspirin  and Plavix  for 12 months followed by Plavix  monotherapy, however, the patient already has severe PAD which requires aspirin  and Plavix  as home meds anyway.   #Severe hyperglycemia #T2DM Glucoses initially elevated to 500+ on this admission with no elevation in beta hydroxybutyrate, normal anion gap, and no ketonuria.  In the ED, she was started on Endo tool and transition to subcutaneous insulin .  No osmolar labs were obtained in the ED and  thought to be severe hyperglycemia versus HHS.  Home regimen includes Semglee  36 units daily, 6 units Humalog 3 times daily with meals, Mounjaro  2.5 mg weekly (though this was not started yet).  She was given resistant SSI and long-acting insulin  was titrated up to 30 units while she was in the hospital.  We have prescribed Ozempic  to be given in the outpatient setting as her insurance did not cover Mounjaro .   #Peripheral Arterial Disease s/p multiple revascularizations #Mild Right Carotid Artery Stenosis  Follows with Dr. Serene with vascular surgery. S/p right axillary to common femoral and left femoral to popliteal bypass grafts. Carotid US  showed 1-39% stenosis in left ICA and 40-59% in right ICA. No indications for CEA at this time. No other acute concerns regarding PAD.   #Ovarian CA s/p hysterectomy and bilateral salpingo-oophorectomy #Elevated CEA Repeat CEA within normal range. Do not see reason for patient to follow up with GI at this time. 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 Viktoria.  CEA s/p procedures was elevated at 6.44 but still decreasing from prior value of 14.40.  She was recommended to follow-up with GI for further workup, but do not see any definitive documentation of a follow-up visit.   #HTN Pressures initially elevated at 150s/70s.  Soft pressures while titrating GDMT. Previously on home meds of Lisinopril  5 mg and Carvedilol  6.25 mg bid.  These were discontinued in place of new GDMT.  See medications above.   #HLD Last LDL of 182 and HDL 33 on 07/01/2024.  Currently taking Lipitor 80 mg daily and Zetia  10 mg daily.  Per cardiology, patient is pending referral to lipid clinic to assess need for Repatha in the outpatient setting.  #Depression Patient had normal mood and affect during admission.  No acute concerns.  We decreased citalopram  from 40 mg to 20 mg due to increased risk for QTc prolongation in  patients above age 5.  Continued citalopram  at this reduced dose and Wellbutrin  while inpatient.   #Tobacco Use Disorder Patient caught vaping in the bathroom.  Refused nicotine  patches.  Continue Wellbutrin  while inpatient, however continued counseling on cessation of vaping would be beneficial in outpatient setting.   Discharge Subjective: Vanessa [name] reports that she does not have any acute concerns. She would like to go home before the snowstorm this weekend.  She denies any chest pain or shortness of breath at this time.  Awaiting cardiology evaluation for final recommendations. Patient is medically ready for discharge.  Discharge Exam:   BP (!) 115/49 (BP Location: Right Arm)   Pulse 67   Temp 99.2 F (37.3 C) (Oral)   Resp 20   Ht 5' 2 (1.575 m)   Wt 70.5 kg   LMP 12/24/2013   SpO2 93%   BMI 28.42 kg/m  Physical Exam: Constitutional: Chronically ill-appearing female in no acute distress HENT: normocephalic atraumatic, mucous membranes moist Eyes: conjunctiva non-erythematous, PERRL, no scleral icterus; xanthelasmas Cardiovascular: regular rate and rhythm seen on monitor Pulmonary/Chest: normal work of breathing on 2L [place] Neurological: alert & oriented x3, moving all extremities equally Skin: warm and dry Psych: normal mood and affect, thought content normal   Pertinent Labs, Studies, and Procedures:     Latest Ref Rng & Units 07/17/2024    4:06 AM 07/16/2024    5:01 AM 07/15/2024    4:32 AM  CBC  WBC 4.0 - 10.5 K/uL 12.0  11.7  10.5   Hemoglobin 12.0 - 15.0 g/dL 87.5  87.2  88.0   Hematocrit 36.0 - 46.0 % 38.4  38.8  38.1   Platelets 150 - 400 K/uL 326  359  345        Latest Ref Rng & Units 07/17/2024    4:06 AM 07/16/2024  5:01 AM 07/15/2024    4:32 AM  CMP  Glucose 70 - 99 mg/dL 884  801  874   BUN 8 - 23 mg/dL 22  22  25    Creatinine 0.44 - 1.00 mg/dL 9.28  9.32  9.21   Sodium 135 - 145 mmol/L 139  138  141   Potassium 3.5 - 5.1 mmol/L 4.2  3.8  4.1    Chloride 98 - 111 mmol/L 100  97  100   CO2 22 - 32 mmol/L 31  32  33   Calcium  8.9 - 10.3 mg/dL 9.1  9.4  9.1     ECHOCARDIOGRAM COMPLETE Result Date: 07/12/2024    ECHOCARDIOGRAM REPORT   Patient Name:   Vanessa [name] Date of Exam: 07/12/2024 Medical Rec #:  [id number]   Height:       62.0 in Accession #:    [id number]  Weight:       158.2 lb Date of Birth:  29-Dec-1960   BSA:          1.730 m Patient Age:    63 years    BP:           138/54 mmHg Patient Gender: F           HR:           166 bpm. Exam Location:  Inpatient Procedure: 2D Echo, Cardiac Doppler, Color Doppler and Intracardiac            Opacification Agent (Both Spectral and Color Flow Doppler were            utilized during procedure). Indications:     Dyspnea  History:         Patient has prior history of Echocardiogram examinations, most                  recent 11/26/2022.  Sonographer:     Odella Brewster Referring Phys:  [id number] JULIE ANNE WILLIAMS Diagnosing Phys: Darryle Decent MD IMPRESSIONS  1. Left ventricular ejection fraction, by estimation, is 35 to 40%. The left ventricle has moderately decreased function. The left ventricle demonstrates regional wall motion abnormalities (see scoring diagram/findings for description). Left ventricular  diastolic parameters are consistent with Grade II diastolic dysfunction (pseudonormalization).  2. Right ventricular systolic function is normal. The right ventricular size is normal. Tricuspid regurgitation signal is inadequate for assessing PA pressure.  3. The mitral valve is grossly normal. Trivial mitral valve regurgitation. No evidence of mitral stenosis.  4. The aortic valve is tricuspid. Aortic valve regurgitation is not visualized. No aortic stenosis is present.  5. The inferior vena cava is dilated in size with >50% respiratory variability, suggesting right atrial pressure of 8 mmHg. Comparison(s): Changes from prior study are noted. The left ventricular function is significantly worse.  Conclusion(s)/Recommendation(s): Findings consistent with ischemic cardiomyopathy. FINDINGS  Left Ventricle: Left ventricular ejection fraction, by estimation, is 35 to 40%. The left ventricle has moderately decreased function. The left ventricle demonstrates regional wall motion abnormalities. Definity  contrast agent was given IV to delineate the left ventricular endocardial borders. The left ventricular internal cavity size was normal in size. There is no left ventricular hypertrophy. Left ventricular diastolic parameters are consistent with Grade II diastolic dysfunction (pseudonormalization).  LV Wall Scoring: The entire lateral wall is hypokinetic. Right Ventricle: The right ventricular size is normal. No increase in right ventricular wall thickness. Right ventricular systolic function is normal. Tricuspid regurgitation signal is inadequate for assessing PA pressure. Left  Atrium: Left atrial size was normal in size. Right Atrium: Right atrial size was normal in size. Pericardium: There is no evidence of pericardial effusion. Mitral Valve: The mitral valve is grossly normal. Trivial mitral valve regurgitation. No evidence of mitral valve stenosis. MV peak gradient, 4.4 mmHg. The mean mitral valve gradient is 3.0 mmHg. Tricuspid Valve: The tricuspid valve is grossly normal. Tricuspid valve regurgitation is trivial. No evidence of tricuspid stenosis. Aortic Valve: The aortic valve is tricuspid. Aortic valve regurgitation is not visualized. No aortic stenosis is present. Aortic valve mean gradient measures 4.0 mmHg. Aortic valve peak gradient measures 6.0 mmHg. Aortic valve area, by VTI measures 2.27 cm. Pulmonic Valve: The pulmonic valve was grossly normal. Pulmonic valve regurgitation is trivial. No evidence of pulmonic stenosis. Aorta: The aortic root and ascending aorta are structurally normal, with no evidence of dilitation. Venous: The right lower pulmonary vein is normal. The inferior vena cava is dilated  in size with greater than 50% respiratory variability, suggesting right atrial pressure of 8 mmHg. IAS/Shunts: The atrial septum is grossly normal.  LEFT VENTRICLE PLAX 2D LVIDd:         4.80 cm      Diastology LVIDs:         3.80 cm      LV e' medial:    5.22 cm/s LV PW:         0.90 cm      LV E/e' medial:  19.7 LV IVS:        1.10 cm      LV e' lateral:   7.07 cm/s LVOT diam:     2.10 cm      LV E/e' lateral: 14.6 LV SV:         61 LV SV Index:   35 LVOT Area:     3.46 cm LV IVRT:       95 msec  LV Volumes (MOD) LV vol d, MOD A2C: 141.0 ml LV vol d, MOD A4C: 135.0 ml LV vol s, MOD A2C: 75.9 ml LV vol s, MOD A4C: 78.1 ml LV SV MOD A2C:     65.1 ml LV SV MOD A4C:     135.0 ml LV SV MOD BP:      63.1 ml RIGHT VENTRICLE            IVC RV S prime:     9.57 cm/s  IVC diam: 2.30 cm TAPSE (M-mode): 2.1 cm                            PULMONARY VEINS                            Diastolic Velocity: 44.30 cm/s                            S/D Velocity:       1.10                            Systolic Velocity:  50.90 cm/s LEFT ATRIUM             Index        RIGHT ATRIUM           Index LA diam:        3.80 cm 2.20 cm/m  RA Area:     14.50 cm LA Vol (A2C):   52.4 ml 30.28 ml/m  RA Volume:   38.50 ml  22.25 ml/m LA Vol (A4C):   51.8 ml 29.94 ml/m LA Biplane Vol: 52.6 ml 30.40 ml/m  AORTIC VALVE                    PULMONIC VALVE AV Area (Vmax):    2.22 cm     PV Vmax:       0.83 m/s AV Area (Vmean):   2.31 cm     PV Peak grad:  2.7 mmHg AV Area (VTI):     2.27 cm AV Vmax:           122.00 cm/s AV Vmean:          90.400 cm/s AV VTI:            0.269 m AV Peak Grad:      6.0 mmHg AV Mean Grad:      4.0 mmHg LVOT Vmax:         78.20 cm/s LVOT Vmean:        60.400 cm/s LVOT VTI:          0.176 m LVOT/AV VTI ratio: 0.65  AORTA Ao Root diam: 2.80 cm Ao Asc diam:  2.70 cm MITRAL VALVE MV Area (PHT): 3.12 cm     SHUNTS MV Area VTI:   2.04 cm     Systemic VTI:  0.18 m MV Peak grad:  4.4 mmHg     Systemic Diam: 2.10 cm MV Mean  grad:  3.0 mmHg MV Vmax:       1.05 m/s MV Vmean:      78.7 cm/s MV Decel Time: 243 msec MV E velocity: 103.00 cm/s MV A velocity: 98.00 cm/s MV E/A ratio:  1.05 Darryle Decent MD Electronically signed by Darryle Decent MD Signature Date/Time: 07/12/2024/1:04:55 PM    Final (Updated)    DG Chest Port 1 View Result Date: 07/11/2024 EXAM: 1 VIEW(S) XRAY OF THE CHEST 07/11/2024 06:10:53 PM COMPARISON: None available. CLINICAL HISTORY: sob FINDINGS: LUNGS AND PLEURA: Small bilateral pleural effusions. Diffuse interstitial prominence, consistent with possible pulmonary edema or atypical/viral infection. Left lung base atelectasis. No focal pulmonary opacity. No pneumothorax. HEART AND MEDIASTINUM: Heart size at upper limits of normal. Aortic atherosclerosis. BONES AND SOFT TISSUES: No acute osseous abnormality. IMPRESSION: 1. Diffuse interstitial prominence, which may reflect pulmonary edema or atypical/viral infection. 2. Small bilateral pleural effusions. 3. Left lung base atelectasis. Electronically signed by: Greig Pique MD 07/11/2024 06:45 PM EST RP Workstation: [id number]     Discharge Instructions:   Discharge Instructions      To Vanessa [name] or their caretakers,  You were recently admitted to [facility] for difficulty breathing induced by a panic attack as well as heart failure.  During your stay, you saw a cardiologist who recommended new medications for your heart failure.  You also received diuretics (medicine to remove the fluid) while you are in the hospital and we have recommended you continue taking 1 orally.  During your stay, you also had a catheterization to examine the blood vessels around your heart.  The cardiologist recommended getting better control of your diabetes before discussing possible surgery to improve blood flow (CABG; coronary artery bypass graft).  For your anxiety, we would like to continue the hydroxyzine  that was helping you during your stay and we have also  decreased the dose of  your Celexa  (citalopram ) to 20 mg to reduce the risk of abnormal heart rhythms.  Continue taking your home medications with the following changes:  Start taking Losartan  (Cozaar ) 25 mg daily Metoprolol  succinate (Toprol -XL) 25 mg daily Spironolactone  (Aldactone ) 25 mg daily Torsemide  40 mg daily Hydroxyzine  (Atarax ) 25 mg 3 times daily as needed for anxiety Guaifenesin  (Mucinex ) 600 mg twice daily as needed for cough or to loosen phlegm Stop taking Carvedilol  (Coreg ) 6.25 mg twice daily Lisinopril  (Zestril ) 5 mg daily Change how you take Citalopram  (Celexa ) - dose was changed from 40 mg to 20 mg daily   You should seek further medical care if you experience worsening chest pain, shortness of breath, abdominal pain, one-sided leg swelling/redness, or one-sided weakness/numbness/tingling.  Please follow up with the following doctors/specialties: Cardiology - You have an appointment with the Cardiology team at 07/22/2024 3:30 PM.  We have also scheduled you a follow-up appointment in the [facility] clinic on 07/24/24 at 2:15 PM with Dr. Kem. We are so glad that you are feeling better.  Sincerely,  [facility]      Signed:  Letha Cheadle, MD Internal Medicine Resident, PGY-1 07/17/2024, 1:48 PM Please contact the on call pager after 5 pm and on weekends at [phone].  "

## 2024-07-17 NOTE — Progress Notes (Deleted)
 Mobility Specialist Progress Note;   07/17/24 1015  Therapy Vitals  Patient Position (if appropriate) Orthostatic Vitals  Orthostatic Lying   BP- Lying 125/52 ((74))  Pulse- Lying 67  Orthostatic Sitting  BP- Sitting 118/60 ((77))  Pulse- Sitting 64  Orthostatic Standing at 3 minutes  BP- Standing at 3 minutes 127/59 ((78))  Pulse- Standing at 3 minutes 67   Pt received in bed agreeable to mobility. Found on 2L/min, Able to take orthostatic VS (see above), with no complaints . Pt was left on 2L/min with MD in room. Call bell and personal belongings in reach. All needs met. Will f/u to walk again as able.   Ricky Janeal MATSU, BS Mobility Specialist Please contact via Special Educational Needs Teacher or [employer] [phone]

## 2024-07-17 NOTE — TOC Transition Note (Signed)
 Transition of Care [facility]) - Discharge Note   Patient Details  Name: Vanessa [name] MRN: [id number] Date of Birth: June 04, 1961  Transition of Care [facility]) CM/SW Contact:  Sudie Erminio Deems, RN Phone Number: 07/17/2024, 2:33 PM   Clinical Narrative:  MD is aware that the patient has declined home health. No further needs identified at this time.    Final next level of care: Home/Self Care   Patient Goals and CMS Choice Patient states their goals for this hospitalization and ongoing recovery are:: plan to return home with family support.  Discharge Plan and Services Additional resources added to the After Visit Summary for     Discharge Planning Services: CM Consult Post Acute Care Choice: NA            DME Agency: NA       HH Arranged: Patient Refused HH  Social Drivers of Health (SDOH) Interventions SDOH Screenings   Food Insecurity: No Food Insecurity (07/12/2024)  Housing: Low Risk (07/12/2024)  Transportation Needs: No Transportation Needs (07/12/2024)  Utilities: Not At Risk (07/12/2024)  Alcohol Screen: Low Risk (06/02/2024)  Depression (PHQ2-9): Medium Risk (07/03/2024)  Social Connections: Socially Isolated (07/12/2024)  Stress: Stress Concern Present (06/02/2024)  Tobacco Use: Medium Risk (07/12/2024)  Health Literacy: Adequate Health Literacy (06/02/2024)     Readmission Risk Interventions    08/28/2023   10:08 AM 12/06/2022   10:13 AM 11/23/2022   10:35 AM  Readmission Risk Prevention Plan  Post Dischage Appt Complete    Medication Screening Complete    Transportation Screening Complete Complete Complete  PCP or Specialist Appt within 5-7 Days   Complete  Home Care Screening   Complete  Medication Review (RN CM)   Complete  HRI or Home Care Consult  Complete   Social Work Consult for Recovery Care Planning/Counseling  Complete   Palliative Care Screening  Not Applicable   Medication Review Oceanographer)  Complete

## 2024-07-17 NOTE — Progress Notes (Signed)
 Mobility Specialist Progress Note;   07/17/24 1015  Therapy Vitals  Patient Position (if appropriate) Orthostatic Vitals  Orthostatic Lying   BP- Lying 125/52 ((74))  Pulse- Lying 67  Orthostatic Sitting  BP- Sitting 118/60 ((77))  Pulse- Sitting 64  Orthostatic Standing at 3 minutes  BP- Standing at 3 minutes 127/59 ((78))  Pulse- Standing at 3 minutes 67  Mobility  Activity Stood with assistance  Level of Assistance Independent  Assistive Device None  Distance Ambulated (ft) 0 ft  Activity Response Tolerated well  Mobility Referral Yes  Mobility visit 1 Mobility  Mobility Specialist Start Time (ACUTE ONLY) 1015  Mobility Specialist Stop Time (ACUTE ONLY) 1025  Mobility Specialist Time Calculation (min) (ACUTE ONLY) 10 min    Pt received in bed agreeable to mobility. Found on 2L/min, Able to take orthostatic VS (see above), with no complaints . Pt was left on 2L/min with MD in room. Call bell and personal belongings in reach. All needs met. Will f/u to walk again as able.   Ricky Janeal MATSU, BS Mobility Specialist Please contact via Special Educational Needs Teacher or [employer] [phone]

## 2024-07-17 NOTE — Telephone Encounter (Signed)
 Pharmacy Patient Advocate Encounter  Received notification from [employer] that Prior Authorization for Ozempic  (0.25 or 0.5 MG/DOSE) 2MG /3ML pen-injectors has been APPROVED from 07/17/24 to 07/17/25   PA #/Case ID/Reference #: [id number]

## 2024-07-17 NOTE — Progress Notes (Signed)
" ° °  Heart Failure Stewardship Pharmacist Progress Note   PCP: Jolaine Pac, DO PCP-Cardiologist: Peter Jordan, MD    HPI:  64 year old female with a PMH significant for COPD on 2 L oxygen PTA, asthma, T2DM, HTN, HLD, severe PAD s/p revascuarization, depression/anxiety, and tobacco abuse (former smoker -- 43 pack-years but currently vapes daily) who presented on 07/11/24 with shortness of breath. Her family stated that she has had intermittent chest pain over the past few weeks and had followed with cardiology who planned to do a cardiac cath.  CXR showed diffuse interstitial changes with small bilateral pleural effision and left lower lobe atlectasis without focal consolidation. ECHO revealed an EF of 35-40%. She underwet a R/LHC on 1/18 which showed 3 vessel obstructive CAD, high LV filling pressures -- PCWP mean 30, LVEDP 40, mean PAP  30, and CO/CI 4.61/2.66. She was deemed a poor candidate for a CABG given her multiple comorbidities.  Patient is doing well today. She denies SOB, chest pain or issues lying flat. Her lower extremity edema is improved on physical exam. She is currently on 2 L O2 . Her BP has remained soft, but she denies dizziness or lightheadedness. Recommended to the cardiologist to increase spironolactone  at discharge to 25 mg daily to best optimize GDMT, since we cannot transition her to an ARNI or start an SGLT2i. Patient asked if her Atarax  could be continued at discharge. I messaged the MD to continue. Discussed medication changes for discharge, and she will follow-up next Tuesday in clinic.   Current HF Medications: Diuretic: torsemide  40 mg daily Beta Blocker: metoprolol  succinate 25 mg daily ACE/ARB/ARNI: losartan  25 mg daily MRA: spironolactone  12.5 mg daily  Prior to admission HF Medications: Beta blocker: carvedilol  6.25 mg BID ACE/ARB/ARNI: lisinopril  5 mg daily  Pertinent Lab Values: Serum creatinine 0.71, BUN 22, Potassium 4.2, Sodium 139, proBNP 854,  Magnesium  2.1, A1c 12.4%  Vital Signs: Weight: 155 lbs (admission weight: 158 lbs) Blood pressure: 90s-110s/40s-70s  Heart rate: 50s-60s I/O: net -0.7 L yesterday; net -5.9 L since admission  Medication Assistance / Insurance Benefits Check: Does the patient have prescription insurance?  Yes Type of insurance plan: Medicare and Medicaid  Outpatient Pharmacy:  Prior to admission outpatient pharmacy: [facility] Is the patient willing to use [facility] TOC pharmacy at discharge? Yes Is the patient willing to transition their outpatient pharmacy to utilize a [facility] outpatient pharmacy?   Yes    Assessment: 1. Acute on chronic systolic CHF (LVEF 35-40%), due to ichemic etiology. NYHA class II symptoms. -Improved lower extremity edema present on physical exam > IV diuresis to torsemide  40 mg daily today - K 4.2 - BP soft > losartan  added yesterday (last dose of PTA lisinopril  on 1/18) - A1c 12.4% + history of yeast infection on Jardiance  > would hold off on SGLT2i   Plan: 1) Medication changes recommended at this time: - Consider increasing spironolactone  to 25 mg daily  2)  Education  - Patient has been educated on current HF medications and potential additions to HF medication regimen - Patient verbalizes understanding that over the next few months, these medication doses may change and more medications may be added to optimize HF regimen - Patient has been educated on basic disease state pathophysiology and goals of therapy   B. Maegan Kinneth Fujiwara, PharmD PGY-1 Pharmacy Resident [facility] 07/17/2024 8:26 AM    "

## 2024-07-17 NOTE — Progress Notes (Addendum)
 "  Progress Note  Patient Name: Vanessa [name] Date of Encounter: 07/17/2024 [facility] Cardiologist: Peter Jordan, MD   Interval Summary   Doing well today, eager to go home ahead of the ice/snow predicted for this weekend. Denies any chest pain, dyspnea, or lower extremity swelling. On baseline 2L/min O2 via [place]. Able to ambulate without difficulties or symptoms. No new complaints or concerns   Vital Signs Vitals:   07/16/24 1602 07/16/24 1946 07/16/24 1959 07/17/24 0413  BP: 113/70  (!) 99/51 111/62  Pulse: 64  63 (!) 59  Resp: 18  18 20   Temp: 98.6 F (37 C)  99 F (37.2 C) 98.2 F (36.8 C)  TempSrc: Oral  Oral Oral  SpO2: 97% 95% 97% 94%  Weight:    70.5 kg  Height:        Intake/Output Summary (Last 24 hours) at 07/17/2024 9047 Last data filed at 07/17/2024 0940 Gross per 24 hour  Intake 440 ml  Output 900 ml  Net -460 ml      07/17/2024    4:13 AM 07/16/2024    5:02 AM 07/14/2024    3:53 AM  Last 3 Weights  Weight (lbs) 155 lb 6.4 oz 156 lb 3.2 oz 159 lb 1.6 oz  Weight (kg) 70.489 kg 70.852 kg 72.167 kg      Telemetry/ECG  Sinus rhythm, rates in the 60s - Personally Reviewed  Physical Exam  GEN: Resting comfortably in bed, on 2L/min O2 via [place], in no acute distress.   Neck: No JVD Cardiac: RRR, no murmurs, rubs, or gallops.  Respiratory: Mild crackles at lung bases bilaterally GI: Soft, nontender MS: No peripheral edema  Assessment & Plan  NSTEMI, type 1 Multivessel CAD Presented with sudden onset SOB, denied any angina but had typical angina (burning chest pain with exertion and eating) that started in September 2025 that gradually worsened. Frequency 3x/week. Was originally scheduled for outpatient LHC on Monday, 07/14/24 Hs-troponins elevated, peaked at 423 proBNP elevated 854 EKG showed NSR, no Q waves in anterior leads Echo showed LVEF 35-40% with severe HK of entire lateral wall, new change compared to prior echo LHC showed 3-vessel  obstructive CAD/diffuse diabetic disease not amenable to PCI Poor CABG candidate due to multiple comorbidities, angina likely to improve with improvement in LV pressures. Will optimize CHF therapy and manage CAD medically Continue ASA and Plavix  x 12 months, followed by Plavix  monotherapy Imdur  discontinued 1/20 due to hypotension. Remains chest-pain free since LHC, no need to antianginals at this time Continue home atorvastatin  80 mg daily, Zetia  Continue metoprolol  succinate 25 mg daily Outpatient f/u scheduled with primary cardiologist on 08/01/24   Acute systolic and diastolic heart failure Presentation as above Echo showed LVEF 35-40%, RWMA (severe HK of entire lateral wall), G2DD, normal RV systolic function, dilated IVC RHC showed elevated filling pressures (PCWP 26/27, mean 30 mmHg, LVEDP 40 mmHg. PAP 47/26, mean 35 mmHg. RA pressure 17/16, mean 14 mmHg.), necessitating aggressive diuresis Net I/O -5.9 L since admission, with -0.9 L in the past 24 hours Weight 74.4 kg >> 70.5 kg Stable creatinine Initiate GDMT as tolerated Will defer SGLT2i for now given uncontrolled diabetes. Known intolerance to Jardiance  due to history of severe yeast infection Continue metoprolol  succinate 25 mg daily Continue spironolactone  12.5 mg daily Continue losartan  25 mg daily Remains euvolemic but has been aggressively diuresed with IV Lasix  80 mg BID for markedly elevated filling pressures on RHC. Will switch to PO torsemide  40  mg daily Probable discharge today pending MD evaluation Outpatient f/u scheduled for 07/22/24, can repeat BMP at that time due to new GDMT started this admission   Hyperlipidemia Hypertriglyceridemia Lipid panel this admission with total cholesterol 299, triglycerides 420, HDL 33, LDL 182. Continue home atorvastatin  80 mg daily and Zetia  Uncontrolled despite current therapy, needs aggressive lipid-lowering to prevent progression of CAD. Can consider [facility] as  outpatient  Hypertension Soft BP throughout admission, limiting GDMT Imdur  discontinued 1/20 to allow BP room for GDMT Continue metoprolol  succinate 25 mg daily and losartan  25 mg daily   Hypokalemia, resolved K 3.8 yesterday, replaced. K 4.2 today   PAD s/p multiple revascularizations Follows with vascular surgery (Dr. Serene) S/p right axillary to common femoral and left femoral to popliteal bypass grafts Carotid US  showed 1-39% stenosis in left ICA and 40-59% in right ICA, no indications for CEA at this time Continue home ASA and Plavix  Ongoing management per primary   Per primary Chronic hypoxic respiratory failure Type 2 diabetes mellitus COPD Depression/anxiety/panic attacks   For questions or updates, please contact [facility] Please consult [email or web address] for contact info under         Signed, Owen MARLA Daniels, PA-C   "

## 2024-07-17 NOTE — Discharge Instructions (Addendum)
 To [name] or their caretakers,  You were recently admitted to [facility] for difficulty breathing induced by a panic attack as well as heart failure.  During your stay, you saw a cardiologist who recommended new medications for your heart failure.  You also received diuretics (medicine to remove the fluid) while you are in the hospital and we have recommended you continue taking 1 orally.  During your stay, you also had a catheterization to examine the blood vessels around your heart.  The cardiologist recommended getting better control of your diabetes before discussing possible surgery to improve blood flow (CABG; coronary artery bypass graft).  For your anxiety, we would like to continue the hydroxyzine  that was helping you during your stay and we have also decreased the dose of your Celexa  (citalopram ) to 20 mg to reduce the risk of abnormal heart rhythms.  Continue taking your home medications with the following changes:  Start taking Losartan  (Cozaar ) 25 mg daily Metoprolol  succinate (Toprol -XL) 25 mg daily Spironolactone  (Aldactone ) 25 mg daily Torsemide  40 mg daily Hydroxyzine  (Atarax ) 25 mg 3 times daily as needed for anxiety Guaifenesin  (Mucinex ) 600 mg twice daily as needed for cough or to loosen phlegm Stop taking Carvedilol  (Coreg ) 6.25 mg twice daily Lisinopril  (Zestril ) 5 mg daily Ibuprofen  Change how you take Citalopram  (Celexa ) - dose was changed from 40 mg to 20 mg daily   You should seek further medical care if you experience worsening chest pain, shortness of breath, abdominal pain, one-sided leg swelling/redness, or one-sided weakness/numbness/tingling.  Please follow up with the following doctors/specialties: Cardiology - You have an appointment with the Cardiology team at 07/22/2024 3:30 PM.  We have also scheduled you a follow-up appointment in the [facility] clinic on 07/24/24 at 2:15 PM with Dr. Kem. We are so glad that you are feeling  better.  Sincerely,  [facility]

## 2024-07-18 ENCOUNTER — Other Ambulatory Visit: Payer: Self-pay

## 2024-07-18 ENCOUNTER — Other Ambulatory Visit: Payer: Self-pay | Admitting: Student

## 2024-07-18 NOTE — Telephone Encounter (Signed)
 Copied from CRM #[id number]. Topic: Clinical - Medication Refill >> Jul 18, 2024  9:49 AM DeAngela L wrote: Medication: insulin  lispro (HUMALOG) 100 UNIT/ML injection  Has the patient contacted their pharmacy? no (Agent: If no, request that the patient contact the pharmacy for the refill. If patient does not wish to contact the pharmacy document the reason why and proceed with request.) (Agent: If yes, when and what did the pharmacy advise?)  This is the patient's preferred pharmacy:  [facility] [address] [place] [address] Phone: [phone] Fax: [phone]  Is this the correct pharmacy for this prescription? Yes  If no, delete pharmacy and type the correct one.   Has the prescription been filled recently? No  Is the patient out of the medication? No   Has the patient been seen for an appointment in the last year OR does the patient have an upcoming appointment? Yes   Can we respond through MyChart? No   Agent: Please be advised that Rx refills may take up to 3 business days. We ask that you follow-up with your pharmacy.

## 2024-07-21 ENCOUNTER — Other Ambulatory Visit: Payer: Self-pay

## 2024-07-21 MED ORDER — INSULIN LISPRO (1 UNIT DIAL) 100 UNIT/ML (KWIKPEN)
6.0000 [IU] | PEN_INJECTOR | Freq: Three times a day (TID) | SUBCUTANEOUS | 2 refills | Status: AC
  Filled 2024-07-21: qty 6, 34d supply, fill #0
  Filled 2024-07-31: qty 9, 50d supply, fill #0

## 2024-07-22 ENCOUNTER — Ambulatory Visit (HOSPITAL_COMMUNITY)

## 2024-07-22 NOTE — Progress Notes (Incomplete)
 "    HEART & VASCULAR TRANSITION OF CARE CONSULT NOTE   Referring Physician: Dr. Trudy PCP: Vanessa Pac, DO  Cardiologist: Vanessa Syretta Kochel, MD  HPI: Referred to clinic by Dr. Trudy for heart failure consultation.   Vanessa [name] is a 64 y.o. female with history of T2DM, COPD, HLD, HTN, and PAD s/p R/L iliac stents and L fem-pop bypass 2/25.   She has recently established care with Dr. Corrina Steffensen 07/01/24 for chest pain radiating to her L shoulder and neck. Given her history she was scheduled for a cardiac cath.   Unfortunately she was admitted to [facility] 07/12/23 with respiratory distress due to COPD exacerbation and new onset HFrEF. Echo showed EF 35-40%, G2DD. During hospitalization, she underwent coronary angio showing 3v CAD with diffuse diabetic disease, as well as RHC showing elevated filling pressures with moderate pulmonary hypertension and preserved EF. She was treated medically, as with multiple comorbidities it was felt she would be a poor CABG candidate. She was diuresed and started on GDMT.    Today she presents for transition of care visit. Overall feeling ***. NYHA ***. Reports {Symptoms; cardiac:12860::dyspnea,fatigue}. Denies {Symptoms; cardiac:12860::chest pain,dyspnea,fatigue,near-syncope,orthopnea,palpitations,dizziness,abnormal bleeding}. Able to perform ADLs. Appetite okay. Weight at home ***. BP at home***. Compliant with all medications. Denies ETOH, tobacco, or drug use.    Cardiac Testing:    Past Medical History:  Diagnosis Date   Asthma    No PFT performed   Blood transfusion without reported diagnosis    has to donate blood due to having thick blood.   Complication of anesthesia    Woken up afterwards with panic attacks   COPD (chronic obstructive pulmonary disease) (HCC)    Depression    Diabetes mellitus 2002   Dyspnea    with exertion   GERD (gastroesophageal reflux disease)    Helicobacter pylori (H. pylori) infection    s/p  triple therapy   Hepatic hemangioma    History of heart murmur in childhood    History of kidney stones    Hyperlipidemia    Hypertension    Hypertriglyceridemia    Panic attacks    mostly Agaraphobia   Peripheral arterial disease    Peripheral vascular disease    Pneumonia    Ventral hernia     Current Outpatient Medications  Medication Sig Dispense Refill   Insulin  Pen Needle (PEN NEEDLES) 32G X 4 MM MISC Use 3-4x a day while on insulin  therapy 100 each 11   acetaminophen  (TYLENOL ) 650 MG CR tablet Take 1,300 mg by mouth every 8 (eight) hours as needed for pain.     albuterol  (VENTOLIN  HFA) 108 (90 Base) MCG/ACT inhaler Inhale 2 puffs into the lungs every 6 (six) hours as needed. 6.7 g 6   aspirin  EC 81 MG tablet Take 1 tablet (81 mg total) by mouth daily at 6 (six) AM. Swallow whole. 30 tablet 12   atorvastatin  (LIPITOR) 80 MG tablet Take 1 tablet (80 mg total) by mouth daily. (Patient taking differently: Take 40 mg by mouth daily.) 90 tablet 2   buPROPion  (WELLBUTRIN  SR) 150 MG 12 hr tablet Take 1 tablet (150 mg total) by mouth 2 (two) times daily. 60 tablet 3   cetirizine  (ZYRTEC ) 10 MG tablet Take 10 mg by mouth daily as needed for allergies.     cimetidine  (TAGAMET  HB 200) 200 MG tablet Take 200 mg by mouth daily as needed (for heartburn).     citalopram  (CELEXA ) 20 MG tablet Take 1 tablet (20  mg total) by mouth daily. 30 tablet 0   clopidogrel  (PLAVIX ) 75 MG tablet Take 1 tablet (75 mg total) by mouth daily. 90 tablet 3   Continuous Glucose Receiver (DEXCOM G7 RECEIVER) DEVI Use with Dexcom G7 sensors to monitor your glucose continuously 1 each 1   Continuous Glucose Sensor (DEXCOM G7 SENSOR) MISC Place new sensor every 10 days. Use to monitor blood sugar continuously. 9 each 3   ezetimibe  (ZETIA ) 10 MG tablet Take 1 tablet (10 mg total) by mouth daily. 30 tablet 3   Fluticasone -Umeclidin-Vilant (TRELEGY ELLIPTA ) 200-62.5-25 MCG/ACT AEPB Inhale 1 puff into the lungs daily. 60  each 9   gabapentin  (NEURONTIN ) 300 MG capsule Take 1 capsule (300 mg total) by mouth at bedtime. (Patient not taking: Reported on 07/09/2024) 30 capsule 2   glucose blood test strip Use as instructed 100 each 12   guaiFENesin  (MUCINEX ) 600 MG 12 hr tablet Take 1 tablet (600 mg total) by mouth 2 (two) times daily as needed for cough or to loosen phlegm. 60 tablet 0   hydrOXYzine  (ATARAX ) 25 MG tablet Take 1 tablet (25 mg total) by mouth 3 (three) times daily as needed for anxiety. 30 tablet 0   Insulin  Glargine (BASAGLAR  KWIKPEN) 100 UNIT/ML Inject 36 Units into the skin daily. 15 mL 6   insulin  lispro (HUMALOG ) 100 UNIT/ML KwikPen Inject 6 Units into the skin 3 (three) times daily before meals. 9 mL 2   Insulin  Pen Needle (PEN NEEDLES) 31G X 5 MM MISC 1 each by Does not apply route daily. Use to inject insulin  100 each 0   losartan  (COZAAR ) 25 MG tablet Take 1 tablet (25 mg total) by mouth daily. 30 tablet 0   metoprolol  succinate (TOPROL -XL) 25 MG 24 hr tablet Take 1 tablet (25 mg total) by mouth daily. 30 tablet 0   nitroGLYCERIN  (NITROSTAT ) 0.4 MG SL tablet Place 1 tablet (0.4 mg total) under the tongue every 5 (five) minutes as needed for chest pain. (Patient not taking: Reported on 07/11/2024) 75 tablet 3   Semaglutide ,0.25 or 0.5MG /DOS, (OZEMPIC , 0.25 OR 0.5 MG/DOSE,) 2 MG/3ML SOPN Inject 0.25 mg into the skin once a week. 3 mL 0   spironolactone  (ALDACTONE ) 25 MG tablet Take 1 tablet (25 mg total) by mouth daily. 30 tablet 0   torsemide  (DEMADEX ) 20 MG tablet Take 2 tablets (40 mg total) by mouth daily. 60 tablet 0   No current facility-administered medications for this visit.    Allergies[1]  Social History   Socioeconomic History   Marital status: Married    Spouse name: Not on file   Number of children: Not on file   Years of education: Not on file   Highest education level: Not on file  Occupational History   Not on file  Tobacco Use   Smoking status: Former    Current  packs/day: 1.00    Average packs/day: 1 pack/day for 43.0 years (43.0 ttl pk-yrs)    Types: Cigarettes   Smokeless tobacco: Never   Tobacco comments:    Wants Wellbutrin      Vapes currently  Vaping Use   Vaping status: Every Day   Substances: Nicotine   Substance and Sexual Activity   Alcohol use: No    Alcohol/week: 0.0 standard drinks of alcohol    Comment: none   Drug use: No   Sexual activity: Not Currently  Other Topics Concern   Not on file  Social History Narrative   Drinks at least a pot  of coffee every day.    Social Drivers of Health   Tobacco Use: Medium Risk (07/12/2024)   Patient History    Smoking Tobacco Use: Former    Smokeless Tobacco Use: Never    Passive Exposure: Not on Actuary Strain: Not on file  Food Insecurity: No Food Insecurity (07/12/2024)   Epic    Worried About Programme Researcher, Broadcasting/film/video in the Last Year: Never true    Ran Out of Food in the Last Year: Never true  Transportation Needs: No Transportation Needs (07/12/2024)   Epic    Lack of Transportation (Medical): No    Lack of Transportation (Non-Medical): No  Physical Activity: Not on file  Stress: Stress Concern Present (06/02/2024)   [employer] of Occupational Health - Occupational Stress Questionnaire    Feeling of Stress: Very much  Social Connections: Socially Isolated (07/12/2024)   Social Connection and Isolation Panel    Frequency of Communication with Friends and Family: More than three times a week    Frequency of Social Gatherings with Friends and Family: Once a week    Attends Religious Services: Never    Database Administrator or Organizations: No    Attends Banker Meetings: Never    Marital Status: Widowed  Intimate Partner Violence: Not At Risk (07/12/2024)   Epic    Fear of Current or Ex-Partner: No    Emotionally Abused: No    Physically Abused: No    Sexually Abused: No  Depression (PHQ2-9): Medium Risk (07/03/2024)   Depression (PHQ2-9)     PHQ-2 Score: 6  Alcohol Screen: Low Risk (06/02/2024)   Alcohol Screen    Last Alcohol Screening Score (AUDIT): 0  Housing: Low Risk (07/12/2024)   Epic    Unable to Pay for Housing in the Last Year: No    Number of Times Moved in the Last Year: 0    Homeless in the Last Year: No  Utilities: Not At Risk (07/12/2024)   Epic    Threatened with loss of utilities: No  Health Literacy: Adequate Health Literacy (06/02/2024)   B1300 Health Literacy    Frequency of need for help with medical instructions: Never    Family History  Problem Relation Age of Onset   Colon polyps Mother        benign   Ovarian cancer Mother 25   Heart attack Brother    Breast cancer Maternal Aunt        dx late 68s   Diabetes Maternal Uncle    Colon cancer Neg Hx    Stomach cancer Neg Hx    Endometrial cancer Neg Hx    Prostate cancer Neg Hx    Pancreatic cancer Neg Hx    Esophageal cancer Neg Hx    Rectal cancer Neg Hx     There were no vitals filed for this visit.  There were no vitals filed for this visit.  PHYSICAL EXAM: General: *** appearing. No distress  Cardiac: JVP ~*** cm. No murmurs  Resp: Lung sounds clear and equal B/L Abdomen: Soft, non-distended.  Extremities: Warm and dry.  *** edema.  Neuro: A&O x3. Affect pleasant.   ECG (personally reviewed):  ASSESSMENT & PLAN:  Chronic HFrEF, ICM, pHTN - Echo previously normal 6/24. Now with EF 35-40%, G2DD, and ?nl RV (suspect RV dysfunction given CTO of pRCA) - Coronary angio 1/26: 3v disease d/t poorly controlled DM - RHC 1/26: RA 14, PA 47/26 (35), PCW 31,  PAPi 1.5, Fick CO/CI 4.62/2.66 - Suspect pHTN will improve with medical optimization and volume management - NYHA ***. *** - continue *** - GDMT: ? blocker: continue toprol  XL 25 mg daily ARB/ARNI: continue losartan  MRA: continue spiro 25 mg daily SGLT2i: continue spiro 25 mg daily  CAD - cath 1/26: CTO of ost to dist RCA (L>R collaterals), 90% 1st Mrg, 70% mid to distLAD,  80% prox to mid LCX - medical management - continue DAPT: asa/plavix  + statin  T2DM - A1C *** - on semaglutide ,  - needs better control  PAD - follows with [facility]  Referred to HFSW (PCP, Medications, Transportation, ETOH Abuse, Drug Abuse, Insurance, Financial ): {yes/no:20286} Refer to Pharmacy: {yes/no:20286} Refer to Home Health: {yes/no:20286} Refer to Advanced Heart Failure Clinic: {yes/no:20286}  Refer to General Cardiology: {yes/no:20286}  Follow up with Dr. Cyriah Childrey as scheduled 08/01/24.  Ciro Tashiro, NP 07/22/24     [1]  Allergies Allergen Reactions   Jardiance  [Empagliflozin ] Other (See Comments)    Severe yeast infection   Latex Rash   Avandia [Rosiglitazone] Other (See Comments)    Headaches    "

## 2024-07-24 ENCOUNTER — Inpatient Hospital Stay: Payer: Self-pay

## 2024-07-30 ENCOUNTER — Other Ambulatory Visit: Payer: Self-pay

## 2024-07-31 ENCOUNTER — Other Ambulatory Visit: Payer: Self-pay

## 2024-07-31 ENCOUNTER — Encounter: Payer: Self-pay | Admitting: Hematology and Oncology

## 2024-07-31 NOTE — Progress Notes (Unsigned)
 " Cardiology Office Note:    Date:  08/01/2024   ID:  Vanessa [name], DOB 1961-02-21, MRN [id number]  PCP:  Jolaine Pac, DO   [facility] Providers Cardiologist:  Modell Fendrick, MD     Referring MD: Jolaine Pac, DO   Chief Complaint  Patient presents with   Atherosclerosis of native artery of both lower extremities    Follow-up   Congestive Heart Failure   Coronary Artery Disease    History of Present Illness:    Vanessa [name] is a 64 y.o. female seen for post hospital follow up. She has a history of DM, COPD, HLD, HTN, and PAD. She has a history of tobacco abuse. She is s/p axillofemoral bypass on the right and left iliac stenting with left fem-pop bypass. Echo done in June 2024 was normal. No prior ischemic evaluation.   She reports that her husband passed in September. Since that time she has experienced a lot of chest pain. This is a severe burning sensation in her central chest radiating the the left chest. Also will radiate to the left shoulder and arm, left neck and left ear. No relief with antacids but did take one of her husbands sl Ntg with relief. She reports she quit smoking in Feb but still vapes. Sugar has been poorly controlled with A1c 12.4%. she reports he claudication symptoms are doing very well post revascularization.  She was initially scheduled for cardiac cath as OP but was admitted 1/16-1/22/26 with acute CHF and NSTEMI. EF reduced to 35-40%. She underwent cardiac cath showing diffuse 3 vessel CAD really not amenable to PCI. Not a surgical candidate due to comorbidities. LV filling pressures were quite high and she had moderate pulmonary HTN. Optimal medical management was recommended. She was continued on Toprol  XL. Started on losartan , torsemide  and aldactone . BP did not allow initiation of Entresto. Not a candidate for SGLT 2 inhibitor due to history of mycotic infections.   On follow up today she is feeling much better. Denies any chest pain. Breathing  is much improved. Not using oxygen regularly- only when breathing is tight. No edema. Weight same as when discharged.   Past Medical History:  Diagnosis Date   Asthma    No PFT performed   Blood transfusion without reported diagnosis    has to donate blood due to having thick blood.   Complication of anesthesia    Woken up afterwards with panic attacks   COPD (chronic obstructive pulmonary disease) (HCC)    Depression    Diabetes mellitus 2002   Dyspnea    with exertion   GERD (gastroesophageal reflux disease)    Helicobacter pylori (H. pylori) infection    s/p triple therapy   Hepatic hemangioma    History of heart murmur in childhood    History of kidney stones    Hyperlipidemia    Hypertension    Hypertriglyceridemia    Panic attacks    mostly Agaraphobia   Peripheral arterial disease    Peripheral vascular disease    Pneumonia    Ventral hernia     Past Surgical History:  Procedure Laterality Date   ABDOMINAL AORTOGRAM W/LOWER EXTREMITY N/A 11/24/2022   Procedure: ABDOMINAL AORTOGRAM W/LOWER EXTREMITY;  Surgeon: Magda Debby SAILOR, MD;  Location: [facility];  Service: Cardiovascular;  Laterality: N/A;   ABDOMINAL AORTOGRAM W/LOWER EXTREMITY N/A 08/07/2023   Procedure: ABDOMINAL AORTOGRAM W/LOWER EXTREMITY;  Surgeon: Serene Gaile ORN, MD;  Location: [facility];  Service: Cardiovascular;  Laterality: N/A;   AMPUTATION Right 12/02/2022   Procedure: RAY AMPUTATION OF  RIGHT 5TH & 4TH TOES;  Surgeon: Malvin Marsa FALCON, DPM;  Location: [facility];  Service: Podiatry;  Laterality: Right;   APPLICATION OF WOUND VAC Right 11/29/2022   Procedure: APPLICATION OF WOUND VAC AND KERECIS MICRO WOUND GRAFT;  Surgeon: Serene Gaile ORN, MD;  Location: [facility];  Service: Vascular;  Laterality: Right;   AXILLARY-FEMORAL BYPASS GRAFT Right 11/29/2022   Procedure: RIGHT AXILLA ARTERY - RIGHT FEMORAL ARTERY BYPASS GRAFT USING 8mm X 70cm PROPATEN GORE GRAFT WITH REMOVABLE RINGS;   Surgeon: Serene Gaile ORN, MD;  Location: [facility];  Service: Vascular;  Laterality: Right;   CESAREAN SECTION     with 3 children   COLONOSCOPY  2018   ENDARTERECTOMY FEMORAL Left 08/22/2023   Procedure: LEFT ENDARTERECTOMY FEMORAL;  Surgeon: Serene Gaile ORN, MD;  Location: [facility];  Service: Vascular;  Laterality: Left;   FEMORAL-POPLITEAL BYPASS GRAFT Left 08/22/2023   Procedure: LEFT BYPASS GRAFT FEMORAL-POPLITEAL ARTERY;  Surgeon: Serene Gaile ORN, MD;  Location: [facility];  Service: Vascular;  Laterality: Left;   INSERTION OF ILIAC STENT Left 08/22/2023   Procedure: INSERTION OF ILIAC STENT - LEFT;  Surgeon: Serene Gaile ORN, MD;  Location: [facility];  Service: Vascular;  Laterality: Left;   OVARIAN CYST REMOVAL     Age 59   PERIPHERAL VASCULAR INTERVENTION Left 08/07/2023   Procedure: PERIPHERAL VASCULAR INTERVENTION;  Surgeon: Serene Gaile ORN, MD;  Location: [facility];  Service: Cardiovascular;  Laterality: Left;  Iliac   RIGHT/LEFT HEART CATH AND CORONARY ANGIOGRAPHY N/A 07/14/2024   Procedure: RIGHT/LEFT HEART CATH AND CORONARY ANGIOGRAPHY;  Surgeon: Fronia Depass M, MD;  Location: [facility] INV[facility] CV [facility];  Service: Cardiovascular;  Laterality: N/A;   ROBOTIC ASSISTED BILATERAL SALPINGO OOPHERECTOMY Bilateral 11/23/2021   Procedure: XI ROBOTIC ASSISTED BILATERAL SALPINGO OOPHORECTOMY,  MINI LAPAROTOMY;  Surgeon: Viktoria Comer SAUNDERS, MD;  Location: [facility];  Service: Gynecology;  Laterality: Bilateral;   ROBOTIC ASSISTED TOTAL HYSTERECTOMY N/A 11/23/2021   Procedure: XI ROBOTIC ASSISTED TOTAL HYSTERECTOMY, STAGING, CYSTOSCOPY;  Surgeon: Viktoria Comer SAUNDERS, MD;  Location: [facility];  Service: Gynecology;  Laterality: N/A;   TUBAL LIGATION     UPPER GASTROINTESTINAL ENDOSCOPY  2018    Current Medications: Active Medications[1]   Allergies:   Jardiance  [empagliflozin ], Latex, and Avandia [rosiglitazone]   Social History   Socioeconomic History   Marital status: Married    Spouse name: Not on  file   Number of children: 3   Years of education: Not on file   Highest education level: Not on file  Occupational History   Not on file  Tobacco Use   Smoking status: Former    Current packs/day: 1.00    Average packs/day: 1 pack/day for 44.0 years (44.0 ttl pk-yrs)    Types: Cigarettes    Start date: 07/2023   Smokeless tobacco: Never   Tobacco comments:    Wants Wellbutrin      Vapes currently  Vaping Use   Vaping status: Every Day   Substances: Nicotine   Substance and Sexual Activity   Alcohol use: No    Alcohol/week: 0.0 standard drinks of alcohol    Comment: none   Drug use: No   Sexual activity: Not Currently  Other Topics Concern   Not on file  Social History Narrative   Drinks at least a pot of coffee every day.    Social Drivers of Health  Tobacco Use: Medium Risk (08/01/2024)   Patient History    Smoking Tobacco Use: Former    Smokeless Tobacco Use: Never    Passive Exposure: Not on Actuary Strain: Not on file  Food Insecurity: No Food Insecurity (07/12/2024)   Epic    Worried About Programme Researcher, Broadcasting/film/video in the Last Year: Never true    Ran Out of Food in the Last Year: Never true  Transportation Needs: No Transportation Needs (07/12/2024)   Epic    Lack of Transportation (Medical): No    Lack of Transportation (Non-Medical): No  Physical Activity: Not on file  Stress: Stress Concern Present (06/02/2024)   [employer] of Occupational Health - Occupational Stress Questionnaire    Feeling of Stress: Very much  Social Connections: Socially Isolated (07/12/2024)   Social Connection and Isolation Panel    Frequency of Communication with Friends and Family: More than three times a week    Frequency of Social Gatherings with Friends and Family: Once a week    Attends Religious Services: Never    Database Administrator or Organizations: No    Attends Banker Meetings: Never    Marital Status: Widowed  Depression (PHQ2-9): Medium  Risk (07/03/2024)   Depression (PHQ2-9)    PHQ-2 Score: 6  Alcohol Screen: Low Risk (06/02/2024)   Alcohol Screen    Last Alcohol Screening Score (AUDIT): 0  Housing: Low Risk (07/12/2024)   Epic    Unable to Pay for Housing in the Last Year: No    Number of Times Moved in the Last Year: 0    Homeless in the Last Year: No  Utilities: Not At Risk (07/12/2024)   Epic    Threatened with loss of utilities: No  Health Literacy: Adequate Health Literacy (06/02/2024)   B1300 Health Literacy    Frequency of need for help with medical instructions: Never     Family History: The patient's family history includes Breast cancer in her maternal aunt; Colon polyps in her mother; Diabetes in her maternal uncle; Heart attack in her brother; Ovarian cancer (age of onset: 5) in her mother. There is no history of Colon cancer, Stomach cancer, Endometrial cancer, Prostate cancer, Pancreatic cancer, Esophageal cancer, or Rectal cancer.  ROS:   Please see the history of present illness.     All other systems reviewed and are negative.  EKGs/Labs/Other Studies Reviewed:    The following studies were reviewed today:      Echo 11/26/22: IMPRESSIONS     1. Left ventricular ejection fraction, by estimation, is 50 to 55%. The  left ventricle has low normal function. The left ventricle has no regional  wall motion abnormalities. Left ventricular diastolic parameters were  normal.   2. Right ventricular systolic function is normal. The right ventricular  size is normal. Tricuspid regurgitation signal is inadequate for assessing  PA pressure.   3. Right atrial size was mildly dilated.   4. The mitral valve is normal in structure. No evidence of mitral valve  regurgitation. No evidence of mitral stenosis.   5. The aortic valve was not well visualized. Aortic valve regurgitation  is not visualized. No aortic stenosis is present.   6. The inferior vena cava is normal in size with greater than 50%  respiratory  variability, suggesting right atrial pressure of 3 mmHg.       Echo 07/12/24: IMPRESSIONS     1. Left ventricular ejection fraction, by estimation, is 35 to  40%. The  left ventricle has moderately decreased function. The left ventricle  demonstrates regional wall motion abnormalities (see scoring  diagram/findings for description). Left ventricular   diastolic parameters are consistent with Grade II diastolic dysfunction  (pseudonormalization).   2. Right ventricular systolic function is normal. The right ventricular  size is normal. Tricuspid regurgitation signal is inadequate for assessing  PA pressure.   3. The mitral valve is grossly normal. Trivial mitral valve  regurgitation. No evidence of mitral stenosis.   4. The aortic valve is tricuspid. Aortic valve regurgitation is not  visualized. No aortic stenosis is present.   5. The inferior vena cava is dilated in size with >50% respiratory  variability, suggesting right atrial pressure of 8 mmHg.   Comparison(s): Changes from prior study are noted. The left ventricular  function is significantly worse.   Conclusion(s)/Recommendation(s): Findings consistent with ischemic  cardiomyopathy.   Cardiac cath 07/14/24:  RIGHT/LEFT HEART CATH AND CORONARY ANGIOGRAPHY   Conclusion      Prox LAD lesion is 55% stenosed.   Mid LAD to Dist LAD lesion is 70% stenosed.   Lat 1st Mrg lesion is 90% stenosed.   1st Mrg lesion is 70% stenosed.   Prox Cx to Mid Cx lesion is 80% stenosed.   Ost RCA to Dist RCA lesion is 100% stenosed.   LV end diastolic pressure is severely elevated.   Hemodynamic findings consistent with moderate pulmonary hypertension.   3 vessel obstructive CAD- diffuse diabetic disease High LV filling pressures. PCWP 26/27, mean 30 mm Hg. LVEDP 40 mm Hg Moderate pulmonary HTN. PAP 47/26, mean 35 mm Hg RA pressure 17/16, mean 14 mm Hg Cardiac output 4.61 L/min, index 2.66   Plan: recommend optimizing CHF therapy.  Fortunately she does not have left main disease and LAD while diffusely diseased is not severe. She is a poor candidate for CABG due to multiple co morbidities. I expect with improvement in LV pressures her angina will improve.  Would manage her CAD medically.  Will hold lisinopril  in favor of ARB transitioning to Entresto. Needs continued diuresis.   Coronary Diagrams  Diagnostic Dominance: Right    Carotid dopplers 07/13/24: Summary:  Right Carotid: Velocities in the right ICA are consistent with a 40-59%                 stenosis.   Left Carotid: Velocities in the left ICA are consistent with a 1-39%  stenosis.   Vertebrals: Bilateral vertebral arteries demonstrate antegrade flow.  Subclavians: Left subclavian artery was stenotic. Right subclavian artery  flow              was disturbed.  Recent Labs: 07/11/2024: Pro Brain Natriuretic Peptide 854.0 07/12/2024: ALT 46 07/17/2024: BUN 22; Creatinine, Ser 0.71; Hemoglobin 12.4; Magnesium  2.1; Platelets 326; Potassium 4.2; Sodium 139  Recent Lipid Panel    Component Value Date/Time   CHOL 299 (H) 07/01/2024 1054   TRIG 420 (H) 07/01/2024 1054   HDL 33 (L) 07/01/2024 1054   CHOLHDL 9.1 (H) 07/01/2024 1054   CHOLHDL 7.2 08/23/2023 1158   VLDL 66 (H) 08/23/2023 1158   LDLCALC 182 (H) 07/01/2024 1054   LDLDIRECT 158 (H) 12/03/2014 1542     Risk Assessment/Calculations:                Physical Exam:    VS:  BP 102/60 (BP Location: Right Arm, Patient Position: Sitting, Cuff Size: Normal)   Pulse 86   Resp 17   Ht  5' 2 (1.575 m)   Wt 155 lb (70.3 kg)   LMP 12/24/2013   SpO2 95%   BMI 28.35 kg/m     Wt Readings from Last 3 Encounters:  08/01/24 155 lb (70.3 kg)  07/17/24 155 lb 6.4 oz (70.5 kg)  07/03/24 158 lb 3.2 oz (71.8 kg)     GEN:  Well nourished, well developed in no acute distress HEENT: prominent xanthalasma bilateral NECK: No JVD;  right carotid bruit. LYMPHATICS: No lymphadenopathy CARDIAC: RRR, no  murmurs, rubs, gallops, palpable right  axillary thrill.  RESPIRATORY:  Clear to auscultation without rales, wheezing or rhonchi  ABDOMEN: Soft, non-tender, non-distended MUSCULOSKELETAL:  No edema; No deformity  SKIN: Warm and dry NEUROLOGIC:  Alert and oriented x 3 PSYCHIATRIC:  Normal affect   ASSESSMENT:    1. Coronary artery disease involving native coronary artery of native heart with unstable angina pectoris (HCC)   2. Peripheral arterial disease with history of revascularization   3. Acute on chronic combined systolic and diastolic CHF (congestive heart failure) (HCC)   4. Essential hypertension, benign   5. Hyperlipidemia, unspecified hyperlipidemia type     PLAN:    In order of problems listed above:  CAD with recent NSTEMI in setting of acute CHF and COPD exacerbation. 3 vessel CAD. Not amenable to PCI. Not a surgical candidate. Manage medically. Angina is much better with improved CHF management. Continue Toprol  and ASA.  Acute on chronic combined systolic/diastolic CHF. Ischemic CM with EF 35-40%. Well compensated today on aldactone , torsemide , losartan . BP limits further titration. Will check BMET. Low sodium diet.  HTN. Controlled HLD on high dose lipitor and Zetia . Elevated Lipoprotein A. LDL 182. Will arrange follow up with Pharm D to consider Repatha DM  on insulin  with poor control. Per PCP. A1c 12.4% Tobacco abuse. Congratulated on stopping cigarettes but needs to stop vaping as well PAD s/p right axillo femoral BPG, s/p left iliac stent and left fem-pop BPG. Followed by vascular surgery. Right carotid bruit. Modest carotid disease on dopplers. Follow up yearly      Follow up in 3 months with me or APP  Medication Adjustments/Labs and Tests Ordered: Current medicines are reviewed at length with the patient today.  Concerns regarding medicines are outlined above.  Orders Placed This Encounter  Procedures   Basic metabolic panel with GFR   No orders of the  defined types were placed in this encounter.   Patient Instructions  Medication Instructions:  Continue all medications *If you need a refill on your cardiac medications before your next appointment, please call your pharmacy*  Lab Work: Bmet today If you have labs (blood work) drawn today and your tests are completely normal, you will receive your results only by: MyChart Message (if you have MyChart) OR A paper copy in the mail If you have any lab test that is abnormal or we need to change your treatment, we will call you to review the results.  Testing/Procedures: None ordered  Follow-Up: At [facility], you and your health needs are our priority.  As part of our continuing mission to provide you with exceptional heart care, our providers are all part of one team.  This team includes your primary Cardiologist (physician) and Advanced Practice Providers or APPs (Physician Assistants and Nurse Practitioners) who all work together to provide you with the care you need, when you need it.  Your next appointment:  3 months    Provider:  Dr.Simaya Lumadue   We  recommend signing up for the patient portal called MyChart.  Sign up information is provided on this After Visit Summary.  MyChart is used to connect with patients for Virtual Visits (Telemedicine).  Patients are able to view lab/test results, encounter notes, upcoming appointments, etc.  Non-urgent messages can be sent to your provider as well.   To learn more about what you can do with MyChart, go to [email or web address].             Signed, Uri Covey, MD  08/01/2024 10:26 AM    [facility]     [1]  Current Meds  Medication Sig   acetaminophen  (TYLENOL ) 650 MG CR tablet Take 1,300 mg by mouth every 8 (eight) hours as needed for pain.   albuterol  (VENTOLIN  HFA) 108 (90 Base) MCG/ACT inhaler Inhale 2 puffs into the lungs every 6 (six) hours as needed.   aspirin  EC 81 MG tablet Take 1 tablet (81 mg  total) by mouth daily at 6 (six) AM. Swallow whole.   atorvastatin  (LIPITOR) 80 MG tablet Take 1 tablet (80 mg total) by mouth daily.   buPROPion  (WELLBUTRIN  SR) 150 MG 12 hr tablet Take 1 tablet (150 mg total) by mouth 2 (two) times daily.   cetirizine  (ZYRTEC ) 10 MG tablet Take 10 mg by mouth daily as needed for allergies.   cimetidine  (TAGAMET  HB 200) 200 MG tablet Take 200 mg by mouth daily as needed (for heartburn).   citalopram  (CELEXA ) 20 MG tablet Take 1 tablet (20 mg total) by mouth daily.   clopidogrel  (PLAVIX ) 75 MG tablet Take 1 tablet (75 mg total) by mouth daily.   ezetimibe  (ZETIA ) 10 MG tablet Take 1 tablet (10 mg total) by mouth daily.   Fluticasone -Umeclidin-Vilant (TRELEGY ELLIPTA ) 200-62.5-25 MCG/ACT AEPB Inhale 1 puff into the lungs daily.   guaiFENesin  (MUCINEX ) 600 MG 12 hr tablet Take 1 tablet (600 mg total) by mouth 2 (two) times daily as needed for cough or to loosen phlegm.   hydrOXYzine  (ATARAX ) 25 MG tablet Take 1 tablet (25 mg total) by mouth 3 (three) times daily as needed for anxiety.   Insulin  Glargine (BASAGLAR  KWIKPEN) 100 UNIT/ML Inject 36 Units into the skin daily.   insulin  lispro (HUMALOG ) 100 UNIT/ML KwikPen Inject 6 Units into the skin 3 (three) times daily before meals.   losartan  (COZAAR ) 25 MG tablet Take 1 tablet (25 mg total) by mouth daily.   metoprolol  succinate (TOPROL -XL) 25 MG 24 hr tablet Take 1 tablet (25 mg total) by mouth daily.   nitroGLYCERIN  (NITROSTAT ) 0.4 MG SL tablet Place 1 tablet (0.4 mg total) under the tongue every 5 (five) minutes as needed for chest pain.   spironolactone  (ALDACTONE ) 25 MG tablet Take 1 tablet (25 mg total) by mouth daily.   torsemide  (DEMADEX ) 20 MG tablet Take 2 tablets (40 mg total) by mouth daily.   "

## 2024-08-01 ENCOUNTER — Encounter: Payer: Self-pay | Admitting: Cardiology

## 2024-08-01 ENCOUNTER — Other Ambulatory Visit: Payer: Self-pay

## 2024-08-01 ENCOUNTER — Ambulatory Visit: Admitting: Cardiology

## 2024-08-01 VITALS — BP 102/60 | HR 86 | Resp 17 | Ht 62.0 in | Wt 155.0 lb

## 2024-08-01 DIAGNOSIS — E785 Hyperlipidemia, unspecified: Secondary | ICD-10-CM

## 2024-08-01 DIAGNOSIS — I25110 Atherosclerotic heart disease of native coronary artery with unstable angina pectoris: Secondary | ICD-10-CM

## 2024-08-01 DIAGNOSIS — I5043 Acute on chronic combined systolic (congestive) and diastolic (congestive) heart failure: Secondary | ICD-10-CM

## 2024-08-01 DIAGNOSIS — Z9889 Other specified postprocedural states: Secondary | ICD-10-CM

## 2024-08-01 DIAGNOSIS — I739 Peripheral vascular disease, unspecified: Secondary | ICD-10-CM

## 2024-08-01 DIAGNOSIS — I10 Essential (primary) hypertension: Secondary | ICD-10-CM

## 2024-08-01 LAB — BASIC METABOLIC PANEL WITH GFR
BUN/Creatinine Ratio: 24 (ref 12–28)
BUN: 17 mg/dL (ref 8–27)
CO2: 21 mmol/L (ref 20–29)
Calcium: 9.7 mg/dL (ref 8.7–10.3)
Chloride: 100 mmol/L (ref 96–106)
Creatinine, Ser: 0.7 mg/dL (ref 0.57–1.00)
Glucose: 142 mg/dL — ABNORMAL HIGH (ref 70–99)
Potassium: 4.7 mmol/L (ref 3.5–5.2)
Sodium: 138 mmol/L (ref 134–144)
eGFR: 97 mL/min/{1.73_m2}

## 2024-08-01 NOTE — Patient Instructions (Addendum)
 Medication Instructions:  Continue all medications *If you need a refill on your cardiac medications before your next appointment, please call your pharmacy*  Lab Work: Bmet today If you have labs (blood work) drawn today and your tests are completely normal, you will receive your results only by: MyChart Message (if you have MyChart) OR A paper copy in the mail If you have any lab test that is abnormal or we need to change your treatment, we will call you to review the results.  Testing/Procedures: None ordered  Follow-Up: At [facility], you and your health needs are our priority.  As part of our continuing mission to provide you with exceptional heart care, our providers are all part of one team.  This team includes your primary Cardiologist (physician) and Advanced Practice Providers or APPs (Physician Assistants and Nurse Practitioners) who all work together to provide you with the care you need, when you need it.  Your next appointment:  3 months    Provider:  Dr.Jordan         Schedule appointment with Pharmacist in Lipid Clinic      We recommend signing up for the patient portal called MyChart.  Sign up information is provided on this After Visit Summary.  MyChart is used to connect with patients for Virtual Visits (Telemedicine).  Patients are able to view lab/test results, encounter notes, upcoming appointments, etc.  Non-urgent messages can be sent to your provider as well.   To learn more about what you can do with MyChart, go to [email or web address].

## 2024-08-04 ENCOUNTER — Ambulatory Visit: Payer: Self-pay

## 2024-08-11 ENCOUNTER — Ambulatory Visit

## 2024-08-13 ENCOUNTER — Ambulatory Visit: Admitting: Pharmacist

## 2024-10-30 ENCOUNTER — Ambulatory Visit: Admitting: Cardiology
